# Patient Record
Sex: Female | Born: 1945
Health system: Southern US, Community
[De-identification: ages and names within clinical notes are randomized; demographics above are authoritative.]

## PROBLEM LIST (undated history)

## (undated) DIAGNOSIS — K635 Polyp of colon: Secondary | ICD-10-CM

## (undated) DIAGNOSIS — G629 Polyneuropathy, unspecified: Secondary | ICD-10-CM

## (undated) DIAGNOSIS — E119 Type 2 diabetes mellitus without complications: Secondary | ICD-10-CM

## (undated) DIAGNOSIS — T4145XA Adverse effect of unspecified anesthetic, initial encounter: Secondary | ICD-10-CM

## (undated) DIAGNOSIS — T7840XA Allergy, unspecified, initial encounter: Secondary | ICD-10-CM

## (undated) DIAGNOSIS — F419 Anxiety disorder, unspecified: Secondary | ICD-10-CM

## (undated) DIAGNOSIS — I480 Paroxysmal atrial fibrillation: Secondary | ICD-10-CM

## (undated) DIAGNOSIS — E041 Nontoxic single thyroid nodule: Secondary | ICD-10-CM

## (undated) DIAGNOSIS — G894 Chronic pain syndrome: Secondary | ICD-10-CM

## (undated) DIAGNOSIS — I4719 Other supraventricular tachycardia: Secondary | ICD-10-CM

## (undated) DIAGNOSIS — J069 Acute upper respiratory infection, unspecified: Secondary | ICD-10-CM

## (undated) DIAGNOSIS — C801 Malignant (primary) neoplasm, unspecified: Secondary | ICD-10-CM

## (undated) DIAGNOSIS — M1990 Unspecified osteoarthritis, unspecified site: Secondary | ICD-10-CM

## (undated) DIAGNOSIS — K2980 Duodenitis without bleeding: Secondary | ICD-10-CM

## (undated) DIAGNOSIS — R9431 Abnormal electrocardiogram [ECG] [EKG]: Secondary | ICD-10-CM

## (undated) DIAGNOSIS — I471 Supraventricular tachycardia, unspecified: Secondary | ICD-10-CM

## (undated) DIAGNOSIS — G8929 Other chronic pain: Secondary | ICD-10-CM

## (undated) DIAGNOSIS — I10 Essential (primary) hypertension: Secondary | ICD-10-CM

## (undated) DIAGNOSIS — S129XXA Fracture of neck, unspecified, initial encounter: Secondary | ICD-10-CM

## (undated) DIAGNOSIS — T8859XA Other complications of anesthesia, initial encounter: Secondary | ICD-10-CM

## (undated) DIAGNOSIS — K219 Gastro-esophageal reflux disease without esophagitis: Secondary | ICD-10-CM

## (undated) DIAGNOSIS — K5730 Diverticulosis of large intestine without perforation or abscess without bleeding: Secondary | ICD-10-CM

## (undated) DIAGNOSIS — C539 Malignant neoplasm of cervix uteri, unspecified: Secondary | ICD-10-CM

## (undated) DIAGNOSIS — I499 Cardiac arrhythmia, unspecified: Secondary | ICD-10-CM

## (undated) DIAGNOSIS — I509 Heart failure, unspecified: Secondary | ICD-10-CM

## (undated) DIAGNOSIS — R079 Chest pain, unspecified: Secondary | ICD-10-CM

## (undated) DIAGNOSIS — I219 Acute myocardial infarction, unspecified: Secondary | ICD-10-CM

## (undated) DIAGNOSIS — S060X9A Concussion with loss of consciousness of unspecified duration, initial encounter: Secondary | ICD-10-CM

## (undated) DIAGNOSIS — K589 Irritable bowel syndrome without diarrhea: Secondary | ICD-10-CM

## (undated) DIAGNOSIS — M797 Fibromyalgia: Secondary | ICD-10-CM

## (undated) DIAGNOSIS — S060XAA Concussion with loss of consciousness status unknown, initial encounter: Secondary | ICD-10-CM

## (undated) HISTORY — DX: Allergy, unspecified, initial encounter: T78.40XA

## (undated) HISTORY — PX: CARDIAC ELECTROPHYSIOLOGY STUDY AND ABLATION: SHX1294

## (undated) HISTORY — DX: Polyneuropathy, unspecified: G62.9

## (undated) HISTORY — DX: Nontoxic single thyroid nodule: E04.1

## (undated) HISTORY — DX: Other supraventricular tachycardia: I47.19

## (undated) HISTORY — DX: Fibromyalgia: M79.7

## (undated) HISTORY — DX: Duodenitis without bleeding: K29.80

## (undated) HISTORY — DX: Diverticulosis of large intestine without perforation or abscess without bleeding: K57.30

## (undated) HISTORY — DX: Acute myocardial infarction, unspecified: I21.9

## (undated) HISTORY — PX: UPPER GASTROINTESTINAL ENDOSCOPY: SHX188

## (undated) HISTORY — DX: Gastro-esophageal reflux disease without esophagitis: K21.9

## (undated) HISTORY — DX: Essential (primary) hypertension: I10

## (undated) HISTORY — DX: Unspecified osteoarthritis, unspecified site: M19.90

## (undated) HISTORY — DX: Polyp of colon: K63.5

## (undated) HISTORY — DX: Chronic pain syndrome: G89.4

## (undated) HISTORY — PX: APPENDECTOMY: SHX54

## (undated) HISTORY — PX: BIOPSY THYROID: PRO38

## (undated) HISTORY — PX: ESOPHAGOGASTRODUODENOSCOPY: SHX1529

## (undated) HISTORY — DX: Concussion with loss of consciousness status unknown, initial encounter: S06.0XAA

## (undated) HISTORY — DX: Irritable bowel syndrome, unspecified: K58.9

## (undated) HISTORY — DX: Anxiety disorder, unspecified: F41.9

## (undated) HISTORY — DX: Fracture of neck, unspecified, initial encounter: S12.9XXA

## (undated) HISTORY — DX: Malignant neoplasm of cervix uteri, unspecified: C53.9

## (undated) HISTORY — DX: Supraventricular tachycardia: I47.1

## (undated) HISTORY — PX: COLPOSCOPY: SHX161

## (undated) HISTORY — DX: Concussion with loss of consciousness of unspecified duration, initial encounter: S06.0X9A

## (undated) NOTE — *Deleted (*Deleted)
***In Progress*** PCP:  Corwin Levins, MD      Cardiologist:  Dr. Shirlee Latch  HPI:  Renee [name] is a 27 y.o. female who has a h/o AVNRT ablation, NSVT, long QT syndrome, and chronic chest pain syndrome. She has a long history of chest pain. Coronary CT angiogram in 10/14 showed no coronary plaque and calcium score = 0.   She has been on diltiazem CD which helped her chest pain in the past.  She was admitted overnight in 4/18 with shoulder pain and diaphoresis. She took NTG and got lightheaded. Troponin was negative x 3, ECG was unremarkable. She was noted on device interrogation to have had an episode of SVT in the 160s. This, however, was asymptomatic and not related to the admission. Toprol XL 12.5 mg daily was added to her regimen but she subsequently stopped it.   With SVT runs on device interrogation in 03/2017, I increased her flecainide was increased to 100 mg bid. It was subsequently decreased back to 50 mg bid.   With increased chest pain, she had coronary CTA again in 04/2018.  This showed coronary artery calcium score 0, no significant coronary disease.   Echo in 10/2018 showed EF 55-60%, grade 2 diastolic dysfunction, normal RV size and systolic function.  Echo in 09/2019 with EF 55-60%, moderate LVH, normal RV.   She has a history of "spells" where she is profoundly weak, breathless, lightheaded, flushed, "shaky," "spaced out," and nauseated. She has had extensive workup of these spells. She saw Dr Ladona Ridgel for placement of loop recorder. He also had her start flecainide due to concern for SVT. She had a tilt test in 12/14 that was negative. Finally, in 02/12/20, her monitor captured a 7 second pause during the day associated with presyncope. She had a Biotronik dual chamber PPM placed.   She was recently seen for follow-up with Dr. Shirlee Latch on 04/07/20. She reported still having "spells" where she felt profoundly weak, lightheaded, tremulous, and short of breath.  Interrogation of device did not show corresponding arrhythmias.  BP had been running high, SBP 130s-150s when she checked at home. BP 180/90 was in clinic. Did not have chest pain or orthopnea/PND.  Was generally able to walk on flat ground without problem except when she had a "spell."  When these occurred, she had to sit down. She continued to note neuropathic-type pain and numbness in her feet and hands.  Today she returns to HF clinic for pharmacist medication titration. At last visit with MD losartan 25 mg daily was started.     Last time: 180/90, HR 71 (labs ok) Plan A: Increase losartan to 50 (seems to be ~130-140s usually) Plan B: Switch to Entresto 24/26 Plan C: Add spironolactone 12.5 or 25 daily pending BP Plan D: SGLT2 Labs: at next visit (already checked since starting losartan) F/u: 4-5 weeks pharmacy, DM on 08/11/20   Overall feeling ***. Dizziness, lightheadedness, fatigue:  Chest pain or palpitations:  How is your breathing?: *** SOB: Able to complete all ADLs. Activity level ***  Weight at home pounds. Takes furosemide/torsemide/bumex *** mg *** daily.  LEE PND/Orthopnea  Appetite *** Low-salt diet:   Physical Exam Cost/affordability of meds   HF Medications: Losartan 25 mg daily Furosemide 20 mg daily Potassium Chloride 30 mEq daily  Has the patient been experiencing any side effects to the medications prescribed?  {YES NO:22349}  Does the patient have any problems obtaining medications due to transportation or finances?   {YES NU:27253}  Understanding of regimen: {excellent/good/fair/poor:19665} Understanding of indications: {excellent/good/fair/poor:19665} Potential of compliance: {excellent/good/fair/poor:19665} Patient understands to avoid NSAIDs. Patient understands to avoid decongestants.    Pertinent Lab Values 04/22/20: . Serum creatinine 0.79, BUN 7, Potassium 3.8, Sodium 138   Vital Signs: . Weight: *** (last clinic weight: ***) . Blood  pressure: ***  . Heart rate: ***   Assessment: 1. SVT: Prior history of AVNRT ablation. On flecainide 50 mg bid, she is much less symptomatic in terms of palpitations.  - Continue diltiazem CD 240 mg daily.  - Continue flecainide 50 mg BID  2. Long QT syndrome: The QT interval has been normal to marginally elevated on ECGs since I have seen her. Avoid QT-prolonging medications.   3. Chest pain syndrome: Long history of atypical chest pain. Workups have not been suggestive of macrovascular coronary disease. Cannot rule out microvascular angina or coronary vasospasm. Coronary CTA in 11/19 was normal.  No recent chest pain.  - Continue diltiazem CD for possible microvascular angina.   4. HTN: BP elevated recently.  - Continue*** losartan ***25 mg daily   5. Syncope/complete heart block: 7 second waking pause seen on monitor in past. Likely vagal etiology but nonetheless concerning presentation. Biotronik dual chamber PPM was placed.  She subsequently developed pacemaker syndrome and was markedly symptomatic with RV pacing.  She is now set at [facility] with no problems  6. Atrial fibrillation: Paroxysmal. Very short runs of atrial fibrillation have been noted on device interrogation. Given lack of longer runs of atrial fibrillation, there is no clear anticoagulation indication. Monitor closely.   7. Chronic diastolic CHF: Echo in 4/21 with EF 55-60%, moderate LVH.  Now on a low dose of Lasix.  She is not volume overloaded on exam. She still describes NYHA class III symptoms that seem to wax and wane.  - Continue Lasix 20 mg daily. BMET today.  - Given diastolic CHF with LVH on echo and neuropathy symptoms, would consider cardiac amyloidosis.  Myeloma panel and urine immunofixation were negative.  Currently in the process of arranging for a PYP scan.   Plan: 1) Medication changes: Based on clinical presentation, vital signs and recent labs will *** 2) Labs: *** 3) Follow-up: ***   Tama Headings, PharmD PGY2 Cardiology Pharmacy Resident  Karle Plumber, PharmD, BCPS, [facility], CPP Heart Failure Clinic Pharmacist [phone]

---

## 1978-06-13 HISTORY — PX: CHOLECYSTECTOMY: SHX55

## 1988-09-05 HISTORY — PX: ORIF RADIAL HEAD / NECK FRACTURE: SUR956

## 1991-06-14 HISTORY — PX: ABLATION OF DYSRHYTHMIC FOCUS: SHX254

## 1991-09-12 DIAGNOSIS — I219 Acute myocardial infarction, unspecified: Secondary | ICD-10-CM

## 1991-09-12 HISTORY — DX: Acute myocardial infarction, unspecified: I21.9

## 1999-02-17 ENCOUNTER — Encounter: Payer: Self-pay | Admitting: Emergency Medicine

## 1999-02-17 ENCOUNTER — Observation Stay (HOSPITAL_COMMUNITY): Admission: EM | Admit: 1999-02-17 | Discharge: 1999-02-18 | Payer: Self-pay | Admitting: Emergency Medicine

## 1999-02-18 ENCOUNTER — Encounter: Payer: Self-pay | Admitting: Cardiology

## 1999-09-14 ENCOUNTER — Emergency Department (HOSPITAL_COMMUNITY): Admission: EM | Admit: 1999-09-14 | Discharge: 1999-09-14 | Payer: Self-pay | Admitting: Emergency Medicine

## 1999-09-14 ENCOUNTER — Encounter: Payer: Self-pay | Admitting: Emergency Medicine

## 2000-03-05 ENCOUNTER — Encounter: Payer: Self-pay | Admitting: Pulmonary Disease

## 2000-03-05 ENCOUNTER — Ambulatory Visit (HOSPITAL_COMMUNITY): Admission: RE | Admit: 2000-03-05 | Discharge: 2000-03-05 | Payer: Self-pay | Admitting: Pulmonary Disease

## 2000-04-05 ENCOUNTER — Encounter: Payer: Self-pay | Admitting: Cardiology

## 2000-04-05 ENCOUNTER — Ambulatory Visit (HOSPITAL_COMMUNITY): Admission: RE | Admit: 2000-04-05 | Discharge: 2000-04-05 | Payer: Self-pay | Admitting: Cardiology

## 2000-04-18 ENCOUNTER — Ambulatory Visit (HOSPITAL_COMMUNITY): Admission: RE | Admit: 2000-04-18 | Discharge: 2000-04-18 | Payer: Self-pay | Admitting: Pulmonary Disease

## 2000-04-19 ENCOUNTER — Encounter: Payer: Self-pay | Admitting: Pulmonary Disease

## 2000-05-01 ENCOUNTER — Encounter: Payer: Self-pay | Admitting: Pulmonary Disease

## 2000-05-01 ENCOUNTER — Encounter (INDEPENDENT_AMBULATORY_CARE_PROVIDER_SITE_OTHER): Payer: Self-pay | Admitting: *Deleted

## 2000-05-01 ENCOUNTER — Ambulatory Visit (HOSPITAL_COMMUNITY): Admission: RE | Admit: 2000-05-01 | Discharge: 2000-05-01 | Payer: Self-pay | Admitting: Pulmonary Disease

## 2000-06-13 HISTORY — PX: HYSTEROSCOPY: SHX211

## 2000-07-31 ENCOUNTER — Ambulatory Visit (HOSPITAL_COMMUNITY): Admission: RE | Admit: 2000-07-31 | Discharge: 2000-07-31 | Payer: Self-pay | Admitting: Pulmonary Disease

## 2000-07-31 ENCOUNTER — Encounter: Payer: Self-pay | Admitting: Pulmonary Disease

## 2000-10-18 ENCOUNTER — Encounter (INDEPENDENT_AMBULATORY_CARE_PROVIDER_SITE_OTHER): Payer: Self-pay

## 2000-10-18 ENCOUNTER — Other Ambulatory Visit: Admission: RE | Admit: 2000-10-18 | Discharge: 2000-10-18 | Payer: Self-pay | Admitting: Obstetrics and Gynecology

## 2000-11-09 ENCOUNTER — Encounter: Payer: Self-pay | Admitting: Surgery

## 2000-11-09 ENCOUNTER — Ambulatory Visit (HOSPITAL_COMMUNITY): Admission: RE | Admit: 2000-11-09 | Discharge: 2000-11-09 | Payer: Self-pay | Admitting: Surgery

## 2000-12-09 ENCOUNTER — Encounter: Payer: Self-pay | Admitting: Emergency Medicine

## 2000-12-09 ENCOUNTER — Emergency Department (HOSPITAL_COMMUNITY): Admission: EM | Admit: 2000-12-09 | Discharge: 2000-12-09 | Payer: Self-pay | Admitting: Emergency Medicine

## 2000-12-12 ENCOUNTER — Emergency Department (HOSPITAL_COMMUNITY): Admission: EM | Admit: 2000-12-12 | Discharge: 2000-12-12 | Payer: Self-pay | Admitting: Emergency Medicine

## 2001-01-25 ENCOUNTER — Encounter (INDEPENDENT_AMBULATORY_CARE_PROVIDER_SITE_OTHER): Payer: Self-pay | Admitting: Specialist

## 2001-01-25 ENCOUNTER — Ambulatory Visit (HOSPITAL_COMMUNITY): Admission: RE | Admit: 2001-01-25 | Discharge: 2001-01-25 | Payer: Self-pay | Admitting: Obstetrics and Gynecology

## 2001-04-17 ENCOUNTER — Encounter: Admission: RE | Admit: 2001-04-17 | Discharge: 2001-07-16 | Payer: Self-pay | Admitting: Orthopaedic Surgery

## 2001-05-11 ENCOUNTER — Encounter: Payer: Self-pay | Admitting: Surgery

## 2001-05-11 ENCOUNTER — Encounter: Admission: RE | Admit: 2001-05-11 | Discharge: 2001-05-11 | Payer: Self-pay | Admitting: Obstetrics and Gynecology

## 2001-06-10 ENCOUNTER — Encounter: Payer: Self-pay | Admitting: Orthopedic Surgery

## 2001-06-10 ENCOUNTER — Ambulatory Visit (HOSPITAL_COMMUNITY): Admission: RE | Admit: 2001-06-10 | Discharge: 2001-06-10 | Payer: Self-pay | Admitting: Orthopedic Surgery

## 2001-07-24 ENCOUNTER — Inpatient Hospital Stay (HOSPITAL_COMMUNITY): Admission: AD | Admit: 2001-07-24 | Discharge: 2001-07-27 | Payer: Self-pay | Admitting: Cardiology

## 2002-01-24 ENCOUNTER — Encounter: Payer: Self-pay | Admitting: Cardiology

## 2002-01-24 ENCOUNTER — Inpatient Hospital Stay (HOSPITAL_COMMUNITY): Admission: EM | Admit: 2002-01-24 | Discharge: 2002-01-25 | Payer: Self-pay

## 2002-01-25 ENCOUNTER — Encounter: Payer: Self-pay | Admitting: Cardiology

## 2002-06-27 ENCOUNTER — Encounter: Payer: Self-pay | Admitting: Surgery

## 2002-06-27 ENCOUNTER — Encounter: Admission: RE | Admit: 2002-06-27 | Discharge: 2002-06-27 | Payer: Self-pay | Admitting: Surgery

## 2002-09-16 ENCOUNTER — Inpatient Hospital Stay (HOSPITAL_COMMUNITY): Admission: EM | Admit: 2002-09-16 | Discharge: 2002-09-17 | Payer: Self-pay | Admitting: *Deleted

## 2002-09-16 ENCOUNTER — Encounter: Payer: Self-pay | Admitting: *Deleted

## 2002-11-25 ENCOUNTER — Encounter (HOSPITAL_COMMUNITY): Admission: RE | Admit: 2002-11-25 | Discharge: 2003-02-23 | Payer: Self-pay | Admitting: Cardiology

## 2003-02-24 ENCOUNTER — Encounter (HOSPITAL_COMMUNITY): Admission: RE | Admit: 2003-02-24 | Discharge: 2003-05-25 | Payer: Self-pay | Admitting: Cardiology

## 2003-06-05 ENCOUNTER — Encounter: Admission: RE | Admit: 2003-06-05 | Discharge: 2003-06-05 | Payer: Self-pay | Admitting: Obstetrics and Gynecology

## 2003-06-24 ENCOUNTER — Encounter: Admission: RE | Admit: 2003-06-24 | Discharge: 2003-06-24 | Payer: Self-pay | Admitting: Surgery

## 2003-07-16 ENCOUNTER — Encounter: Admission: RE | Admit: 2003-07-16 | Discharge: 2003-07-16 | Payer: Self-pay | Admitting: Cardiology

## 2003-07-23 ENCOUNTER — Ambulatory Visit (HOSPITAL_COMMUNITY): Admission: RE | Admit: 2003-07-23 | Discharge: 2003-07-23 | Payer: Self-pay | Admitting: Cardiology

## 2003-11-19 ENCOUNTER — Emergency Department (HOSPITAL_COMMUNITY): Admission: EM | Admit: 2003-11-19 | Discharge: 2003-11-19 | Payer: Self-pay | Admitting: Emergency Medicine

## 2004-03-23 ENCOUNTER — Emergency Department (HOSPITAL_COMMUNITY): Admission: EM | Admit: 2004-03-23 | Discharge: 2004-03-23 | Payer: Self-pay | Admitting: Emergency Medicine

## 2004-04-29 ENCOUNTER — Ambulatory Visit: Payer: Self-pay | Admitting: Internal Medicine

## 2004-05-07 ENCOUNTER — Ambulatory Visit: Payer: Self-pay | Admitting: Cardiology

## 2005-04-20 ENCOUNTER — Encounter: Admission: RE | Admit: 2005-04-20 | Discharge: 2005-04-20 | Payer: Self-pay | Admitting: Obstetrics and Gynecology

## 2005-05-03 ENCOUNTER — Encounter: Admission: RE | Admit: 2005-05-03 | Discharge: 2005-05-03 | Payer: Self-pay | Admitting: Obstetrics and Gynecology

## 2005-06-13 HISTORY — PX: CORONARY ANGIOPLASTY: SHX604

## 2005-07-28 ENCOUNTER — Ambulatory Visit: Payer: Self-pay | Admitting: Gastroenterology

## 2005-08-10 ENCOUNTER — Encounter (INDEPENDENT_AMBULATORY_CARE_PROVIDER_SITE_OTHER): Payer: Self-pay | Admitting: Specialist

## 2005-08-10 ENCOUNTER — Ambulatory Visit: Payer: Self-pay | Admitting: Gastroenterology

## 2005-09-13 ENCOUNTER — Ambulatory Visit: Payer: Self-pay | Admitting: Gastroenterology

## 2005-09-19 ENCOUNTER — Ambulatory Visit: Payer: Self-pay | Admitting: Gastroenterology

## 2005-09-23 ENCOUNTER — Ambulatory Visit: Payer: Self-pay | Admitting: Cardiology

## 2005-09-23 ENCOUNTER — Inpatient Hospital Stay (HOSPITAL_COMMUNITY): Admission: EM | Admit: 2005-09-23 | Discharge: 2005-09-24 | Payer: Self-pay | Admitting: Emergency Medicine

## 2005-09-26 ENCOUNTER — Ambulatory Visit: Payer: Self-pay

## 2005-09-29 ENCOUNTER — Ambulatory Visit: Payer: Self-pay | Admitting: Pulmonary Disease

## 2005-09-29 ENCOUNTER — Ambulatory Visit: Payer: Self-pay

## 2005-10-14 ENCOUNTER — Ambulatory Visit: Payer: Self-pay | Admitting: Pulmonary Disease

## 2005-10-18 ENCOUNTER — Ambulatory Visit: Payer: Self-pay | Admitting: Cardiology

## 2005-12-05 ENCOUNTER — Ambulatory Visit: Payer: Self-pay | Admitting: Pulmonary Disease

## 2005-12-19 ENCOUNTER — Emergency Department (HOSPITAL_COMMUNITY): Admission: EM | Admit: 2005-12-19 | Discharge: 2005-12-20 | Payer: Self-pay | Admitting: Emergency Medicine

## 2006-03-02 ENCOUNTER — Ambulatory Visit: Payer: Self-pay | Admitting: Cardiology

## 2006-03-10 ENCOUNTER — Ambulatory Visit: Payer: Self-pay | Admitting: Cardiology

## 2006-04-05 ENCOUNTER — Ambulatory Visit: Payer: Self-pay | Admitting: Pulmonary Disease

## 2006-06-22 ENCOUNTER — Encounter: Admission: RE | Admit: 2006-06-22 | Discharge: 2006-06-22 | Payer: Self-pay | Admitting: Pulmonary Disease

## 2006-08-09 ENCOUNTER — Encounter: Payer: Self-pay | Admitting: Gastroenterology

## 2006-08-25 ENCOUNTER — Ambulatory Visit: Payer: Self-pay | Admitting: Pulmonary Disease

## 2006-08-29 ENCOUNTER — Ambulatory Visit: Payer: Self-pay | Admitting: Cardiology

## 2007-01-15 ENCOUNTER — Ambulatory Visit: Payer: Self-pay | Admitting: Pulmonary Disease

## 2007-01-15 LAB — CONVERTED CEMR LAB
ALT: 18 units/L (ref 0–35)
AST: 22 units/L (ref 0–37)
Albumin: 4.6 g/dL (ref 3.5–5.2)
Alkaline Phosphatase: 87 units/L (ref 39–117)
BUN: 11 mg/dL (ref 6–23)
Basophils Absolute: 0 10*3/uL (ref 0.0–0.1)
Basophils Relative: 0.5 % (ref 0.0–1.0)
Bilirubin, Direct: 0.2 mg/dL (ref 0.0–0.3)
CO2: 32 meq/L (ref 19–32)
Calcium: 10.1 mg/dL (ref 8.4–10.5)
Chloride: 107 meq/L (ref 96–112)
Creatinine, Ser: 0.7 mg/dL (ref 0.4–1.2)
Eosinophils Absolute: 0.2 10*3/uL (ref 0.0–0.6)
Eosinophils Relative: 2.4 % (ref 0.0–5.0)
Free T4: 0.9 ng/dL (ref 0.6–1.6)
GFR calc Af Amer: 109 mL/min
GFR calc non Af Amer: 90 mL/min
Glucose, Bld: 102 mg/dL — ABNORMAL HIGH (ref 70–99)
HCT: 38.1 % (ref 36.0–46.0)
Hemoglobin: 13 g/dL (ref 12.0–15.0)
Lymphocytes Relative: 48.9 % — ABNORMAL HIGH (ref 12.0–46.0)
MCHC: 34.1 g/dL (ref 30.0–36.0)
MCV: 84 fL (ref 78.0–100.0)
Monocytes Absolute: 0.4 10*3/uL (ref 0.2–0.7)
Monocytes Relative: 6.1 % (ref 3.0–11.0)
Neutro Abs: 2.7 10*3/uL (ref 1.4–7.7)
Neutrophils Relative %: 42.1 % — ABNORMAL LOW (ref 43.0–77.0)
Platelets: 256 10*3/uL (ref 150–400)
Potassium: 4.1 meq/L (ref 3.5–5.1)
Pro B Natriuretic peptide (BNP): 17 pg/mL (ref 0.0–100.0)
RBC: 4.54 M/uL (ref 3.87–5.11)
RDW: 11.8 % (ref 11.5–14.6)
Sed Rate: 8 mm/hr (ref 0–25)
Sodium: 143 meq/L (ref 135–145)
T3, Free: 3.1 pg/mL (ref 2.3–4.2)
TSH: 1.35 microintl units/mL (ref 0.35–5.50)
Total Bilirubin: 1.2 mg/dL (ref 0.3–1.2)
Total Protein: 6.8 g/dL (ref 6.0–8.3)
WBC: 6.3 10*3/uL (ref 4.5–10.5)

## 2007-02-14 ENCOUNTER — Ambulatory Visit: Payer: Self-pay | Admitting: Cardiology

## 2007-03-01 ENCOUNTER — Ambulatory Visit: Payer: Self-pay | Admitting: Pulmonary Disease

## 2007-03-06 ENCOUNTER — Encounter: Admission: RE | Admit: 2007-03-06 | Discharge: 2007-03-06 | Payer: Self-pay | Admitting: Obstetrics and Gynecology

## 2007-07-04 ENCOUNTER — Telehealth: Payer: Self-pay | Admitting: Pulmonary Disease

## 2007-07-20 ENCOUNTER — Telehealth: Payer: Self-pay | Admitting: Pulmonary Disease

## 2007-07-31 ENCOUNTER — Telehealth (INDEPENDENT_AMBULATORY_CARE_PROVIDER_SITE_OTHER): Payer: Self-pay | Admitting: *Deleted

## 2007-08-07 ENCOUNTER — Encounter: Payer: Self-pay | Admitting: Pulmonary Disease

## 2007-08-07 ENCOUNTER — Encounter: Admission: RE | Admit: 2007-08-07 | Discharge: 2007-08-07 | Payer: Self-pay | Admitting: Pulmonary Disease

## 2007-08-14 ENCOUNTER — Ambulatory Visit: Payer: Self-pay | Admitting: Cardiology

## 2007-08-22 ENCOUNTER — Telehealth: Payer: Self-pay | Admitting: Pulmonary Disease

## 2008-02-27 ENCOUNTER — Ambulatory Visit: Payer: Self-pay | Admitting: Cardiology

## 2008-03-14 ENCOUNTER — Ambulatory Visit: Payer: Self-pay | Admitting: Cardiology

## 2008-03-14 LAB — CONVERTED CEMR LAB
Free T4: 0.8 ng/dL (ref 0.6–1.6)
TSH: 2.26 microintl units/mL (ref 0.35–5.50)

## 2008-03-24 DIAGNOSIS — M1990 Unspecified osteoarthritis, unspecified site: Secondary | ICD-10-CM | POA: Insufficient documentation

## 2008-03-24 DIAGNOSIS — M545 Low back pain: Secondary | ICD-10-CM

## 2008-03-24 DIAGNOSIS — K219 Gastro-esophageal reflux disease without esophagitis: Secondary | ICD-10-CM

## 2008-03-24 DIAGNOSIS — M797 Fibromyalgia: Secondary | ICD-10-CM

## 2008-03-24 DIAGNOSIS — I10 Essential (primary) hypertension: Secondary | ICD-10-CM

## 2008-03-24 DIAGNOSIS — F411 Generalized anxiety disorder: Secondary | ICD-10-CM | POA: Insufficient documentation

## 2008-03-24 DIAGNOSIS — Z8669 Personal history of other diseases of the nervous system and sense organs: Secondary | ICD-10-CM | POA: Insufficient documentation

## 2008-03-24 DIAGNOSIS — R079 Chest pain, unspecified: Secondary | ICD-10-CM

## 2008-03-26 ENCOUNTER — Telehealth (INDEPENDENT_AMBULATORY_CARE_PROVIDER_SITE_OTHER): Payer: Self-pay | Admitting: *Deleted

## 2008-04-02 ENCOUNTER — Telehealth (INDEPENDENT_AMBULATORY_CARE_PROVIDER_SITE_OTHER): Payer: Self-pay | Admitting: *Deleted

## 2008-05-26 ENCOUNTER — Ambulatory Visit: Payer: Self-pay | Admitting: Pulmonary Disease

## 2008-05-26 DIAGNOSIS — I2510 Atherosclerotic heart disease of native coronary artery without angina pectoris: Secondary | ICD-10-CM | POA: Insufficient documentation

## 2008-05-26 DIAGNOSIS — R159 Full incontinence of feces: Secondary | ICD-10-CM | POA: Insufficient documentation

## 2008-05-26 DIAGNOSIS — G894 Chronic pain syndrome: Secondary | ICD-10-CM | POA: Insufficient documentation

## 2008-05-26 DIAGNOSIS — E041 Nontoxic single thyroid nodule: Secondary | ICD-10-CM | POA: Insufficient documentation

## 2008-05-26 DIAGNOSIS — K589 Irritable bowel syndrome without diarrhea: Secondary | ICD-10-CM | POA: Insufficient documentation

## 2008-05-26 DIAGNOSIS — J42 Unspecified chronic bronchitis: Secondary | ICD-10-CM | POA: Insufficient documentation

## 2008-05-29 ENCOUNTER — Ambulatory Visit: Payer: Self-pay | Admitting: Pulmonary Disease

## 2008-06-01 LAB — CONVERTED CEMR LAB
ALT: 15 units/L (ref 0–35)
AST: 15 units/L (ref 0–37)
Albumin: 4.5 g/dL (ref 3.5–5.2)
Alkaline Phosphatase: 84 units/L (ref 39–117)
BUN: 8 mg/dL (ref 6–23)
Bacteria, UA: NEGATIVE
Basophils Relative: 0.5 % (ref 0.0–3.0)
Bilirubin Urine: NEGATIVE
Bilirubin, Direct: 0.3 mg/dL (ref 0.0–0.3)
CO2: 30 meq/L (ref 19–32)
Calcium: 10.1 mg/dL (ref 8.4–10.5)
Chloride: 106 meq/L (ref 96–112)
Cholesterol: 159 mg/dL (ref 0–200)
Creatinine, Ser: 0.6 mg/dL (ref 0.4–1.2)
Crystals: NEGATIVE
Eosinophils Relative: 1.8 % (ref 0.0–5.0)
GFR calc Af Amer: 130 mL/min
GFR calc non Af Amer: 108 mL/min
Glucose, Bld: 100 mg/dL — ABNORMAL HIGH (ref 70–99)
HCT: 42 % (ref 36.0–46.0)
HDL: 72.2 mg/dL (ref >39.0)
Hemoglobin: 13.8 g/dL (ref 12.0–15.0)
Ketones, ur: NEGATIVE mg/dL
LDL Cholesterol: 74 mg/dL (ref 0–99)
Lymphocytes Relative: 43 % (ref 12.0–46.0)
MCHC: 32.8 g/dL (ref 30.0–36.0)
MCV: 86.6 fL (ref 78.0–100.0)
Monocytes Relative: 4.4 % (ref 3.0–12.0)
Mucus, UA: NEGATIVE
Neutrophils Relative %: 50.3 % (ref 43.0–77.0)
Nitrite: NEGATIVE
Platelets: 186 10*3/uL (ref 150–400)
Potassium: 4.2 meq/L (ref 3.5–5.1)
RBC / HPF: NONE SEEN
RBC: 4.85 M/uL (ref 3.87–5.11)
RDW: 11.6 % (ref 11.5–14.6)
Sed Rate: 6 mm/hr (ref 0–22)
Sodium: 140 meq/L (ref 135–145)
Specific Gravity, Urine: <=1.005 (ref 1.000–1.03)
Total Bilirubin: 1.6 mg/dL — ABNORMAL HIGH (ref 0.3–1.2)
Total CHOL/HDL Ratio: 2.2
Total Protein, Urine: NEGATIVE mg/dL
Total Protein: 6.8 g/dL (ref 6.0–8.3)
Triglycerides: 62 mg/dL (ref 0–149)
Urine Glucose: NEGATIVE mg/dL
Urobilinogen, UA: 0.2 (ref 0.0–1.0)
VLDL: 12 mg/dL (ref 0–40)
WBC: 4.4 10*3/uL — ABNORMAL LOW (ref 4.5–10.5)
pH: 6 (ref 5.0–8.0)

## 2008-06-16 DIAGNOSIS — K5909 Other constipation: Secondary | ICD-10-CM

## 2008-06-16 DIAGNOSIS — K649 Unspecified hemorrhoids: Secondary | ICD-10-CM | POA: Insufficient documentation

## 2008-06-16 DIAGNOSIS — K5900 Constipation, unspecified: Secondary | ICD-10-CM | POA: Insufficient documentation

## 2008-06-16 DIAGNOSIS — K2980 Duodenitis without bleeding: Secondary | ICD-10-CM | POA: Insufficient documentation

## 2008-06-17 ENCOUNTER — Ambulatory Visit: Payer: Self-pay | Admitting: Gastroenterology

## 2008-06-17 DIAGNOSIS — R1319 Other dysphagia: Secondary | ICD-10-CM

## 2008-06-17 LAB — CONVERTED CEMR LAB
ALT: 13 units/L (ref 0–35)
AST: 14 units/L (ref 0–37)
Albumin: 4.8 g/dL (ref 3.5–5.2)
Alkaline Phosphatase: 85 units/L (ref 39–117)
BUN: 7 mg/dL (ref 6–23)
Basophils Absolute: 0 10*3/uL (ref 0.0–0.1)
Basophils Relative: 0.4 % (ref 0.0–3.0)
Bilirubin, Direct: 0.3 mg/dL (ref 0.0–0.3)
CO2: 29 meq/L (ref 19–32)
Calcium: 10.5 mg/dL (ref 8.4–10.5)
Chloride: 103 meq/L (ref 96–112)
Creatinine, Ser: 0.7 mg/dL (ref 0.4–1.2)
Eosinophils Absolute: 0.1 10*3/uL (ref 0.0–0.7)
Eosinophils Relative: 1 % (ref 0.0–5.0)
Ferritin: 48.7 ng/mL (ref 10.0–291.0)
Folate: 12.1 ng/mL
GFR calc Af Amer: 109 mL/min
GFR calc non Af Amer: 90 mL/min
Glucose, Bld: 100 mg/dL — ABNORMAL HIGH (ref 70–99)
HCT: 40.6 % (ref 36.0–46.0)
Hemoglobin: 14 g/dL (ref 12.0–15.0)
Iron: 111 ug/dL (ref 42–145)
Lymphocytes Relative: 40.8 % (ref 12.0–46.0)
MCHC: 34.4 g/dL (ref 30.0–36.0)
MCV: 84.6 fL (ref 78.0–100.0)
Monocytes Absolute: 0.2 10*3/uL (ref 0.1–1.0)
Monocytes Relative: 4.3 % (ref 3.0–12.0)
Neutro Abs: 2.9 10*3/uL (ref 1.4–7.7)
Neutrophils Relative %: 53.5 % (ref 43.0–77.0)
Platelets: 219 10*3/uL (ref 150–400)
Potassium: 3.7 meq/L (ref 3.5–5.1)
RBC: 4.8 M/uL (ref 3.87–5.11)
RDW: 11.9 % (ref 11.5–14.6)
Saturation Ratios: 27.3 % (ref 20.0–50.0)
Sodium: 141 meq/L (ref 135–145)
TSH: 1.17 microintl units/mL (ref 0.35–5.50)
Total Bilirubin: 1.8 mg/dL — ABNORMAL HIGH (ref 0.3–1.2)
Total Protein: 7.3 g/dL (ref 6.0–8.3)
Transferrin: 290.1 mg/dL (ref >=212.0)
Vitamin B-12: 243 pg/mL (ref 211–911)
WBC: 5.4 10*3/uL (ref 4.5–10.5)

## 2008-06-18 ENCOUNTER — Encounter: Payer: Self-pay | Admitting: Gastroenterology

## 2008-06-20 ENCOUNTER — Telehealth: Payer: Self-pay | Admitting: Internal Medicine

## 2008-06-25 ENCOUNTER — Encounter: Payer: Self-pay | Admitting: Gastroenterology

## 2008-06-25 ENCOUNTER — Ambulatory Visit: Payer: Self-pay | Admitting: Gastroenterology

## 2008-06-25 HISTORY — PX: COLONOSCOPY: SHX174

## 2008-06-26 ENCOUNTER — Encounter: Payer: Self-pay | Admitting: Gastroenterology

## 2008-07-17 ENCOUNTER — Telehealth (INDEPENDENT_AMBULATORY_CARE_PROVIDER_SITE_OTHER): Payer: Self-pay | Admitting: *Deleted

## 2008-08-07 ENCOUNTER — Encounter: Payer: Self-pay | Admitting: Cardiology

## 2008-08-07 ENCOUNTER — Encounter: Admission: RE | Admit: 2008-08-07 | Discharge: 2008-08-07 | Payer: Self-pay | Admitting: Obstetrics and Gynecology

## 2008-08-14 ENCOUNTER — Encounter: Admission: RE | Admit: 2008-08-14 | Discharge: 2008-08-14 | Payer: Self-pay | Admitting: Obstetrics and Gynecology

## 2008-09-08 ENCOUNTER — Telehealth (INDEPENDENT_AMBULATORY_CARE_PROVIDER_SITE_OTHER): Payer: Self-pay | Admitting: *Deleted

## 2008-09-11 ENCOUNTER — Ambulatory Visit: Payer: Self-pay | Admitting: Pulmonary Disease

## 2008-10-03 DIAGNOSIS — I498 Other specified cardiac arrhythmias: Secondary | ICD-10-CM

## 2008-10-06 ENCOUNTER — Ambulatory Visit: Payer: Self-pay | Admitting: Cardiology

## 2008-10-07 LAB — CONVERTED CEMR LAB
BUN: 12 mg/dL (ref 6–23)
CO2: 29 meq/L (ref 19–32)
Chloride: 104 meq/L (ref 96–112)
Creatinine, Ser: 0.9 mg/dL (ref 0.4–1.2)
Magnesium: 2.1 mg/dL (ref 1.5–2.5)
Potassium: 4.3 meq/L (ref 3.5–5.1)

## 2009-01-28 ENCOUNTER — Telehealth: Payer: Self-pay | Admitting: Cardiology

## 2009-03-11 ENCOUNTER — Ambulatory Visit: Payer: Self-pay | Admitting: Pulmonary Disease

## 2009-03-13 HISTORY — PX: TOTAL HIP ARTHROPLASTY: SHX124

## 2009-03-19 ENCOUNTER — Telehealth: Payer: Self-pay | Admitting: Cardiology

## 2009-03-19 ENCOUNTER — Ambulatory Visit: Payer: Self-pay | Admitting: Cardiology

## 2009-03-19 DIAGNOSIS — Q898 Other specified congenital malformations: Secondary | ICD-10-CM

## 2009-03-23 ENCOUNTER — Telehealth: Payer: Self-pay | Admitting: Cardiology

## 2009-03-25 ENCOUNTER — Telehealth: Payer: Self-pay | Admitting: Cardiology

## 2009-05-27 ENCOUNTER — Ambulatory Visit: Payer: Self-pay | Admitting: Cardiology

## 2009-06-24 ENCOUNTER — Telehealth: Payer: Self-pay | Admitting: Pulmonary Disease

## 2009-08-10 ENCOUNTER — Telehealth (INDEPENDENT_AMBULATORY_CARE_PROVIDER_SITE_OTHER): Payer: Self-pay | Admitting: *Deleted

## 2009-08-11 ENCOUNTER — Encounter: Admission: RE | Admit: 2009-08-11 | Discharge: 2009-08-11 | Payer: Self-pay | Admitting: Obstetrics and Gynecology

## 2009-09-16 ENCOUNTER — Telehealth (INDEPENDENT_AMBULATORY_CARE_PROVIDER_SITE_OTHER): Payer: Self-pay | Admitting: *Deleted

## 2009-09-16 ENCOUNTER — Ambulatory Visit: Payer: Self-pay | Admitting: Internal Medicine

## 2009-09-16 DIAGNOSIS — R5383 Other fatigue: Secondary | ICD-10-CM

## 2009-09-16 DIAGNOSIS — R5381 Other malaise: Secondary | ICD-10-CM

## 2009-09-16 LAB — CONVERTED CEMR LAB
Albumin: 4.7 g/dL (ref 3.5–5.2)
Basophils Absolute: 0 10*3/uL (ref 0.0–0.1)
Basophils Relative: 0.5 % (ref 0.0–3.0)
CO2: 32 meq/L (ref 19–32)
Calcium: 9.8 mg/dL (ref 8.4–10.5)
Chloride: 100 meq/L (ref 96–112)
Creatinine, Ser: 0.7 mg/dL (ref 0.4–1.2)
Eosinophils Absolute: 0.1 10*3/uL (ref 0.0–0.7)
Glucose, Bld: 91 mg/dL (ref 70–99)
Hemoglobin: 13.3 g/dL (ref 12.0–15.0)
Lymphocytes Relative: 41.9 % (ref 12.0–46.0)
MCHC: 34.4 g/dL (ref 30.0–36.0)
MCV: 83.6 fL (ref 78.0–100.0)
Monocytes Absolute: 0.2 10*3/uL (ref 0.1–1.0)
Neutro Abs: 2.7 10*3/uL (ref 1.4–7.7)
RBC: 4.64 M/uL (ref 3.87–5.11)
RDW: 14.3 % (ref 11.5–14.6)
Sed Rate: 11 mm/hr (ref 0–22)
Sodium: 140 meq/L (ref 135–145)
TSH: 0.99 microintl units/mL (ref 0.35–5.50)
Total Protein: 7.1 g/dL (ref 6.0–8.3)

## 2009-09-21 ENCOUNTER — Telehealth: Payer: Self-pay | Admitting: Cardiology

## 2009-09-22 ENCOUNTER — Telehealth (INDEPENDENT_AMBULATORY_CARE_PROVIDER_SITE_OTHER): Payer: Self-pay | Admitting: *Deleted

## 2009-09-24 ENCOUNTER — Ambulatory Visit: Payer: Self-pay | Admitting: Pulmonary Disease

## 2009-09-24 DIAGNOSIS — R61 Generalized hyperhidrosis: Secondary | ICD-10-CM

## 2009-09-24 DIAGNOSIS — K5730 Diverticulosis of large intestine without perforation or abscess without bleeding: Secondary | ICD-10-CM

## 2009-09-25 ENCOUNTER — Telehealth (INDEPENDENT_AMBULATORY_CARE_PROVIDER_SITE_OTHER): Payer: Self-pay | Admitting: *Deleted

## 2009-09-28 ENCOUNTER — Ambulatory Visit: Payer: Self-pay | Admitting: Pulmonary Disease

## 2009-09-29 ENCOUNTER — Telehealth: Payer: Self-pay | Admitting: Cardiology

## 2009-10-01 ENCOUNTER — Ambulatory Visit: Payer: Self-pay | Admitting: Cardiology

## 2009-10-01 DIAGNOSIS — R0600 Dyspnea, unspecified: Secondary | ICD-10-CM

## 2009-10-16 ENCOUNTER — Telehealth (INDEPENDENT_AMBULATORY_CARE_PROVIDER_SITE_OTHER): Payer: Self-pay | Admitting: *Deleted

## 2009-10-28 ENCOUNTER — Encounter: Payer: Self-pay | Admitting: Pulmonary Disease

## 2009-11-02 ENCOUNTER — Encounter: Admission: RE | Admit: 2009-11-02 | Discharge: 2009-11-02 | Payer: Self-pay | Admitting: Internal Medicine

## 2009-11-05 ENCOUNTER — Encounter: Payer: Self-pay | Admitting: Pulmonary Disease

## 2009-11-11 ENCOUNTER — Other Ambulatory Visit: Admission: RE | Admit: 2009-11-11 | Discharge: 2009-11-11 | Payer: Self-pay | Admitting: Interventional Radiology

## 2009-11-11 ENCOUNTER — Encounter: Admission: RE | Admit: 2009-11-11 | Discharge: 2009-11-11 | Payer: Self-pay | Admitting: Internal Medicine

## 2009-11-16 ENCOUNTER — Ambulatory Visit: Payer: Self-pay | Admitting: Cardiology

## 2009-11-16 ENCOUNTER — Encounter: Payer: Self-pay | Admitting: Cardiology

## 2009-11-16 ENCOUNTER — Ambulatory Visit: Payer: Self-pay | Admitting: Internal Medicine

## 2009-11-16 ENCOUNTER — Ambulatory Visit: Payer: Self-pay

## 2009-11-16 ENCOUNTER — Ambulatory Visit (HOSPITAL_COMMUNITY): Admission: RE | Admit: 2009-11-16 | Discharge: 2009-11-16 | Payer: Self-pay | Admitting: Cardiology

## 2009-11-20 ENCOUNTER — Encounter: Payer: Self-pay | Admitting: Pulmonary Disease

## 2010-02-22 ENCOUNTER — Encounter: Payer: Self-pay | Admitting: Pulmonary Disease

## 2010-03-18 ENCOUNTER — Telehealth (INDEPENDENT_AMBULATORY_CARE_PROVIDER_SITE_OTHER): Payer: Self-pay | Admitting: *Deleted

## 2010-04-12 ENCOUNTER — Encounter: Payer: Self-pay | Admitting: Pulmonary Disease

## 2010-05-10 ENCOUNTER — Telehealth: Payer: Self-pay | Admitting: Internal Medicine

## 2010-05-17 ENCOUNTER — Encounter: Payer: Self-pay | Admitting: Cardiology

## 2010-05-17 ENCOUNTER — Ambulatory Visit: Payer: Self-pay | Admitting: Cardiology

## 2010-05-17 ENCOUNTER — Telehealth (INDEPENDENT_AMBULATORY_CARE_PROVIDER_SITE_OTHER): Payer: Self-pay | Admitting: *Deleted

## 2010-07-03 ENCOUNTER — Encounter: Payer: Self-pay | Admitting: Obstetrics and Gynecology

## 2010-07-05 ENCOUNTER — Ambulatory Visit
Admission: RE | Admit: 2010-07-05 | Discharge: 2010-07-05 | Payer: Self-pay | Source: Home / Self Care | Attending: Pulmonary Disease | Admitting: Pulmonary Disease

## 2010-07-05 ENCOUNTER — Other Ambulatory Visit: Payer: Self-pay | Admitting: Pulmonary Disease

## 2010-07-05 ENCOUNTER — Encounter: Payer: Self-pay | Admitting: Pulmonary Disease

## 2010-07-05 LAB — BASIC METABOLIC PANEL
BUN: 11 mg/dL (ref 6–23)
CO2: 28 mEq/L (ref 19–32)
Calcium: 10.1 mg/dL (ref 8.4–10.5)
Chloride: 101 mEq/L (ref 96–112)
Creatinine, Ser: 0.6 mg/dL (ref 0.4–1.2)
GFR: 100.83 mL/min (ref >60.00)
Glucose, Bld: 83 mg/dL (ref 70–99)
Potassium: 4.3 mEq/L (ref 3.5–5.1)
Sodium: 137 mEq/L (ref 135–145)

## 2010-07-05 LAB — CBC WITH DIFFERENTIAL/PLATELET
Basophils Absolute: 0 10*3/uL (ref 0.0–0.1)
Basophils Relative: 0.4 % (ref 0.0–3.0)
Eosinophils Absolute: 0.1 10*3/uL (ref 0.0–0.7)
Eosinophils Relative: 1.2 % (ref 0.0–5.0)
HCT: 41.4 % (ref 36.0–46.0)
Hemoglobin: 14.1 g/dL (ref 12.0–15.0)
Lymphocytes Relative: 37.6 % (ref 12.0–46.0)
Lymphs Abs: 2.1 10*3/uL (ref 0.7–4.0)
MCHC: 34.1 g/dL (ref 30.0–36.0)
MCV: 83.5 fl (ref 78.0–100.0)
Monocytes Absolute: 0.2 10*3/uL (ref 0.1–1.0)
Monocytes Relative: 3.7 % (ref 3.0–12.0)
Neutro Abs: 3.1 10*3/uL (ref 1.4–7.7)
Neutrophils Relative %: 57.1 % (ref 43.0–77.0)
Platelets: 218 10*3/uL (ref 150.0–400.0)
RDW: 13.4 % (ref 11.5–14.6)
WBC: 5.4 10*3/uL (ref 4.5–10.5)

## 2010-07-05 LAB — HEPATIC FUNCTION PANEL
ALT: 15 U/L (ref 0–35)
AST: 19 U/L (ref 0–37)
Albumin: 4.8 g/dL (ref 3.5–5.2)
Alkaline Phosphatase: 99 U/L (ref 39–117)
Bilirubin, Direct: 0.2 mg/dL (ref 0.0–0.3)
Total Bilirubin: 1.5 mg/dL — ABNORMAL HIGH (ref 0.3–1.2)
Total Protein: 7.5 g/dL (ref 6.0–8.3)

## 2010-07-05 LAB — URINALYSIS, ROUTINE W REFLEX MICROSCOPIC
Bilirubin Urine: NEGATIVE
Hemoglobin, Urine: NEGATIVE
Nitrite: NEGATIVE
Total Protein, Urine: NEGATIVE
pH: 7 (ref 5.0–8.0)

## 2010-07-05 LAB — LIPID PANEL
Cholesterol: 171 mg/dL (ref 0–200)
HDL: 70.4 mg/dL (ref >39.00)
LDL Cholesterol: 82 mg/dL (ref 0–99)
Triglycerides: 92 mg/dL (ref 0.0–149.0)
VLDL: 18.4 mg/dL (ref 0.0–40.0)

## 2010-07-05 LAB — SEDIMENTATION RATE: Sed Rate: 9 mm/hr (ref 0–22)

## 2010-07-05 LAB — TSH: TSH: 1.2 u[IU]/mL (ref 0.35–5.50)

## 2010-07-06 ENCOUNTER — Telehealth: Payer: Self-pay | Admitting: Pulmonary Disease

## 2010-07-11 LAB — CONVERTED CEMR LAB: Vit D, 25-Hydroxy: 36 ng/mL (ref 30–89)

## 2010-07-13 NOTE — Assessment & Plan Note (Signed)
Summary: f81m   Visit Type:  6 months follow up Primary Provider:  Dr/ Alroy Dust  CC:  Episode really back chest pain a week before Thanksgiving. Took about 3 nitrog.- Upper respiratory infection.  History of Present Illness: Overall doing well.  Has URI symptoms at present.  Has had one round of antibiotics.  Thinks she needs another. Has a call into Dr. Kriste Basque.  Overall cardiac wise has been stable.  Had episode of chest pain on Sewptember 15 and once just before Thanksgiving, both relieved by nitroglycerin.  These are her usual.  Nothing progressive or serious.  Problems Prior to Update: 1)  Dyspnea  (ICD-786.05) 2)  Sweating  (ICD-780.8) 3)  Fatique and Malaise  (ICD-780.79) 4)  Hx of Bronchitis, Recurrent  (ICD-491.9) 5)  Hypertension  (ICD-401.9) 6)  Chest Pain  (ICD-786.50) 7)  Coronary Artery Disease  (ICD-414.00) 8)  Av Nodal Reentry Tachycardia  (ICD-427.89) 9)  Long Qt Syndrome  (ICD-759.89) 10)  Gerd  (ICD-530.81) 11)  Other Dysphagia  (ICD-787.29) 12)  Duodenitis, Without Hemorrhage  (ICD-535.60) 13)  Diverticulosis of Colon  (ICD-562.10) 14)  Irritable Bowel Syndrome  (ICD-564.1) 15)  Constipation, Chronic  (ICD-564.09) 16)  Hemorrhoids  (ICD-455.6) 17)  Fecal Incontinence  (ICD-787.6) 18)  Hx of Thyroid Nodule  (ICD-241.0) 19)  Degenerative Joint Disease  (ICD-715.90) 20)  Back Pain, Lumbar  (ICD-724.2) 21)  Fibromyalgia  (ICD-729.1) 22)  Chronic Pain Syndrome  (ICD-338.4) 23)  Neuropathy, Hx of  (ICD-V12.40) 24)  Anxiety  (ICD-300.00)  Current Medications (verified): 1)  Aspirin Adult Low Strength 81 Mg Tbec (Aspirin) .... Take 1 Tablet By Mouth Once A Day 2)  Nitrostat 0.4 Mg Subl (Nitroglycerin) .... As Needed For Chest Pain 3)  Triamterene-Hctz 37.5-25 Mg Tabs (Triamterene-Hctz) .... 1/2 Table Daily 4)  Dicyclomine Hcl 20 Mg Tabs (Dicyclomine Hcl) .... Take 1 Tablet By Mouth Once A Day 5)  Alprazolam 0.5 Mg  Tabs (Alprazolam) .... Take 1/2 Tablet To 1  Tablet By Mouth Three Times A Day As Needed 6)  Aleve 220 Mg Tabs (Naproxen Sodium) .... As Directed As Needed 7)  Ceftin 250 Mg Tabs (Cefuroxime Axetil) .... Take 1 Tablet By Mouth Two Times A Day  Allergies: 1)  ! Avelox (Moxifloxacin Hcl)  Vital Signs:  Patient profile:   65 year old female Height:      64 inches Weight:      152.75 pounds BMI:     26.31 Pulse rate:   64 / minute Pulse rhythm:   regular Resp:     18 per minute BP sitting:   124 / 82  (left arm) Cuff size:   large  Vitals Entered By: Vikki Ports (May 17, 2010 11:03 AM)  Physical Exam  General:  Well developed, well nourished, in no acute distress. Head:  normocephalic and atraumatic Eyes:  PERRLA/EOM intact; conjunctiva and lids normal. Lungs:  Clear bilaterally to auscultation and percussion.  No ronchii Heart:  PMI normal.  Normal S1 and S2 without murmur rub or gallop. Pulses:  pulses normal in all 4 extremities Extremities:  No clubbing or cyanosis. Neurologic:  Alert and oriented x 3.   EKG  Procedure date:  05/17/2010  Findings:      NSR.  QTc normal.   Impression & Recommendations:  Problem # 1:  CHEST PAIN (ICD-786.50) some recurrence, but seems stable.  multiple prior caths without critical disease.  Would favor continuing a conservative course of action here. Her updated medication  list for this problem includes:    Aspirin Adult Low Strength 81 Mg Tbec (Aspirin) .Marland Kitchen... Take 1 tablet by mouth once a day    Nitrostat 0.4 Mg Subl (Nitroglycerin) .Marland Kitchen... As needed for chest pain  Problem # 2:  LONG QT SYNDROME (ICD-759.89) Not prolonged at present.   Problem # 3:  Hx of BRONCHITIS, RECURRENT (ICD-491.9) calling Dr. Kriste Basque today. Her updated medication list for this problem includes:    Ceftin 250 Mg Tabs (Cefuroxime axetil) .Marland Kitchen... Take 1 tablet by mouth two times a day  Patient Instructions: 1)  Your physician recommends that you schedule a follow-up appointment in: 6 months 2)   Your physician recommends that you continue on your current medications as directed. Please refer to the Current Medication list given to you today. Prescriptions: NITROSTAT 0.4 MG SUBL (NITROGLYCERIN) as needed for chest pain  #25 x 6   Entered by:   Dossie Arbour, RN, BSN   Authorized by:   Ronaldo Miyamoto, MD, [facility]   Signed by:   Dossie Arbour, RN, BSN on 05/17/2010   Method used:   Electronically to        [facility] [id number] (retail)       [address]       [place], [place]  [address]       Ph: [phone]       Fax: [phone]   RxID:   [id number]

## 2010-07-13 NOTE — Progress Notes (Signed)
Summary: refill request for BP med  Phone Note Call from Patient Call back at Home Phone [phone]   Caller: Patient Call For: Kriste Basque Summary of Call: Pt request new refill on triam/hctz 37.5-25mg  sent to [facility] Initial call taken by: Zackery Barefoot CMA,  March 18, 2010 12:02 PM  Follow-up for Phone Call        Rx was sent to pharm.  LMOM for pt to be made aware this was done. Follow-up by: Vernie Murders,  March 18, 2010 2:15 PM

## 2010-07-13 NOTE — Progress Notes (Signed)
Summary: FYI LMTCB x1 -PT REURNED CALL  Phone Note Call from Patient Call back at Home Phone [phone]   Caller: Patient Call For: parrett Summary of Call: Pt wants TP to know she is been seen today by MW at 2:55p. Initial call taken by: Darletta Moll,  September 16, 2009 11:46 AM  Follow-up for Phone Call        [facility] Gweneth Dimitri RN  September 16, 2009 11:53 AM  PT RETURNED CALL FROM CRYSTAL/ TRIAGE. CALL PT BACK AT [phone] ([name] HOUSESITTING FOR SOMEONE) OR HER CELL # THAT NURSE ALREADY CALLED (PT HAS SOME ISSUES WITH THE RECEPTION ON HER CELL, HOWEVER, SO TRY OTHER # FIRST).  Tivis Ringer, CNA  September 16, 2009 12:49 PM  pt called and made an acute appt with MW today because she ahs been having weakness and SOB x few days. She states seh spoke to Dr. Riley Kill because she feels symptoms may be due to her heart and he advised her to keep appt with MW. She just wnated to make SN aware she was seeing MW today. Carron Curie CMA  September 16, 2009 1:03 PM   Additional Follow-up for Phone Call Additional follow up Details #1::        SN is aware Randell Loop CMA  September 16, 2009 2:28 PM

## 2010-07-13 NOTE — Letter (Signed)
Summary: [facility]  [facility]   Imported By: Lester Darrouzett 12/24/2009 08:50:13  _____________________________________________________________________  External Attachment:    Type:   Image     Comment:   External Document

## 2010-07-13 NOTE — Progress Notes (Signed)
Summary: congestion  Phone Note Call from Patient Call back at Home Phone [phone]   Caller: Patient Call For: [name] Summary of Call: Congestion in chest, need antibiotic called in.//[employer],[place] Initial call taken by: Darletta Moll,  June 24, 2009 10:14 AM  Follow-up for Phone Call        called, spoke with pt.  Pt  c/o chest congestion with little amount of dark yellow mucus, "high, high, fever," weakness, and SOB with activity since Friday. denies chest tightness and wheezing.  requesting abx to be called into [facility].  States she is usually given "cefuroxine 250mg  and this works well." Allergies-avelox will forward to SN-please advise.  Thanks!  Follow-up by: Gweneth Dimitri RN,  June 24, 2009 10:35 AM  Additional Follow-up for Phone Call Additional follow up Details #1::        per SN---ok for pt to have the cefuroxine 250mg   1 by mouth two times a day until gone.  called and spoke with pt and she is aware of meds sent to the pharmacy for her. Randell Loop CMA  June 24, 2009 4:23 PM     New/Updated Medications: CEFTIN 250 MG TABS (CEFUROXIME AXETIL) take one tablet by mouth two times a day until gone Prescriptions: CEFTIN 250 MG TABS (CEFUROXIME AXETIL) take one tablet by mouth two times a day until gone  #14 x 0   Entered by:   Randell Loop CMA   Authorized by:   Michele Mcalpine MD   Signed by:   Randell Loop CMA on 06/24/2009   Method used:   Electronically to        [facility] [id number] (retail)       [address]       [place], [place]  [address]       Ph: [phone]       Fax: [phone]   RxID:   [id number]
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
## 2010-07-13 NOTE — Progress Notes (Signed)
Summary: upper respiratory infection  Phone Note Call from Patient Call back at Home Phone [phone]   Caller: Patient Call For: nadel Reason for Call: Talk to Nurse Summary of Call: Patient calling saying she has an upper respiratory infection.  Asking for abx.  [facility] Initial call taken by: Lehman Prom,  May 10, 2010 8:22 AM  Follow-up for Phone Call        Pt last seen in our office on 09/24/2009 w/ no pending appts. LMOMTCB.Michel Bickers CMA  May 10, 2010 9:25 AM  The pt says she cannot come for an appt today but would like abx. She c/o nasal congestion with yellow mucus and a dry hacky cough for 1-2 weeks and sore throat. She denies any fever, incr SOB or wheezing. She also requests refill on Alprazolam be sent to her pharmacy. Pls advise.   Follow-up by: Michel Bickers CMA,  May 10, 2010 9:46 AM  Additional Follow-up for Phone Call Additional follow up Details #1::        per SN----ok for pt to have augmentin 875mg   #14  1 by mouth two times a day until gone--if not pcn allergic and use mucinex 600mg  2 by mouth two times a day with plenty of fluids and delsym 2 tsp by mouth two times a day as needed for cough---ok for the alprazolam 0.5mg   #90  1/2 to 1 tab by mouth three times a day  with no refills. thanks Randell Loop CMA  May 10, 2010 11:34 AM     Additional Follow-up for Phone Call Additional follow up Details #2::    Called, spoke with pt.  She verified no pcn allergy but states augmentin "does not do a thing for me."  Requesting cefuroxime 250mg  x 7 days.  SN, pls advise.  Thanks! Gweneth Dimitri RN  May 10, 2010 11:52 AM   per SN----ok for ceftin 250mg    #14  1 by mouth two times a day until gone. thanks Randell Loop CMA  May 10, 2010 2:28 PM   pt advised of recs and rx sent in. Carron Curie CMA  May 10, 2010 2:35 PM   New/Updated Medications: ALPRAZOLAM 0.5 MG  TABS (ALPRAZOLAM) Take 1/2 tablet to 1 tablet by  mouth three times a day as needed CEFTIN 250 MG TABS (CEFUROXIME AXETIL) Take 1 tablet by mouth two times a day Prescriptions: CEFTIN 250 MG TABS (CEFUROXIME AXETIL) Take 1 tablet by mouth two times a day  #14 x 0   Entered by:   Carron Curie CMA   Authorized by:   Michele Mcalpine MD   Signed by:   Carron Curie CMA on 05/10/2010   Method used:   Telephoned to ...       [facility] [address]* (retail)       [address]       [place], [place]  [address]       Ph: [phone]       Fax: [phone]   RxID:   [id number] ALPRAZOLAM 0.5 MG  TABS (ALPRAZOLAM) Take 1/2 tablet to 1 tablet by mouth three times a day as needed  #90 x 0   Entered by:   Carron Curie CMA   Authorized by:   Michele Mcalpine MD   Signed by:   Carron Curie CMA on 05/10/2010   Method used:   Telephoned to ...       [facility] [id number]* (retail)  [address]       [place], [place]  [address]       Ph: [phone]       Fax: [phone]   RxID:   [id number]

## 2010-07-13 NOTE — Consult Note (Signed)
Summary: [facility]   Imported By: Sherian Rein 12/03/2009 13:28:46  _____________________________________________________________________  External Attachment:    Type:   Image     Comment:   External Document

## 2010-07-13 NOTE — Progress Notes (Signed)
Summary: cough/ congestion  Phone Note Call from Patient Call back at Home Phone [phone]   Caller: Patient Call For: nadel Summary of Call: pt states that she is till coughing/ congested, but better. however, having nausea he believes if due to abx. no fever. pls. advise. [facility] [address] [place] Initial call taken by: Tivis Ringer, CNA,  May 17, 2010 10:02 AM  Follow-up for Phone Call        Spoke with pt.  She states that she finished the ceftin we called in for her on 11/28 and is some better, but still has some prod cough and nasal drainage will dark yellow mucus.  She states that she is having alot of PND that is making her feel nauseated.  She states "I think I need another round to knock this out". Pls advise, thanks! allergic to avelox. Follow-up by: Vernie Murders,  May 17, 2010 10:33 AM  Additional Follow-up for Phone Call Additional follow up Details #1::        per SN----use mucinex 600mg    2 by mouth  two times a day with lots of fluids, no more abx for right now----if not getting better will need rov.  thanks Randell Loop [facility]  May 17, 2010 1:57 PM   The patient says she cannot take any OTC medications due to her hx of long QT's. She was offered an appt w/ TP on 05/18/2010 but did not want to sch  before msg sent back to SN about long QT's. Pls advise. Additional Follow-up by: Michel Bickers CMA,  May 17, 2010 2:06 PM    Additional Follow-up for Phone Call Additional follow up Details #2::    per SN----she can use the plain mucinex for this----if she is not comfortable taking the plain mucinex then she will need to check with Dr. Riley Kill on his recs for otc meds. thanks Randell Loop CMA  May 17, 2010 2:20 PM     Additional Follow-up for Phone Call Additional follow up Details #3:: Details for Additional Follow-up Action Taken: Spoke with pt and notified of the above recs per SN.  Pt verbalized understanding. Additional Follow-up by:  Vernie Murders,  May 17, 2010 2:23 PM

## 2010-07-13 NOTE — Progress Notes (Signed)
Summary: refill  Phone Note Call from Patient Call back at [facility] Phone [phone]   Caller: Patient Call For: nadel Summary of Call: refills:dicyclomine 20mg  nexium 40mg (wants generic) [facility] Initial call taken by: Darletta Moll,  August 10, 2009 9:37 AM  Follow-up for Phone Call        pt aware will need appt before refills can be sent scheduled pt to see sn 10/20/09 at 12:00 and refills were then sent to pharmacy, advised pt she will need to see sn every year to remain an active pt of his Follow-up by: Philipp Deputy CMA,  August 10, 2009 11:11 AM    Prescriptions: NEXIUM 40 MG CPDR (ESOMEPRAZOLE MAGNESIUM) 1 by mouth once daily  #30 x 1   Entered by:   Philipp Deputy CMA   Authorized by:   Michele Mcalpine MD   Signed by:   Philipp Deputy CMA on 08/10/2009   Method used:   Electronically to        [facility] [id number]* (retail)       [address] [place] [address]       [place], [place]  [address]       Ph: [phone]       Fax: [phone]   RxID:   [id number] DICYCLOMINE HCL 20 MG TABS (DICYCLOMINE HCL) take one tablet by mouth every 6 hours as needed for abd cramping  #30 x 1   Entered by:   Philipp Deputy CMA   Authorized by:   Michele Mcalpine MD   Signed by:   Philipp Deputy CMA on 08/10/2009   Method used:   Electronically to        [facility] [id number]* (retail)       [address] [place], [place]  [address]       Ph: [phone]       Fax: [phone]   RxID:   [id number]

## 2010-07-13 NOTE — Letter (Signed)
Summary: [facility]   Imported By: Sherian Rein 12/03/2009 13:32:19  _____________________________________________________________________  External Attachment:    Type:   Image     Comment:   External Document
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
## 2010-07-13 NOTE — Consult Note (Signed)
Summary: [name]   Imported By: Lennie Odor 02/25/2010 15:20:15  _____________________________________________________________________  External Attachment:    Type:   Image     Comment:   External Document

## 2010-07-13 NOTE — Progress Notes (Signed)
Summary: extreme fatigue  Phone Note Call from Patient Call back at [facility] Phone [phone]   Caller: Patient Call For: nadel Summary of Call: pt c/o fatigue x 2 wks. she saw dr wert on 09/16/09. she is no better. she states that she is worse; so fatigued that she can't walk any distance w/ out having to rest. too tired to do anything; "just plain exhausted". wants to see dr nadel asap. can not wait for her cpx scheduled next month. is getting very concerned about this. says she also called her cardiologist and left a msg. but wants to see dr Kriste Basque in the meantime- asap.  Initial call taken by: Tivis Ringer, CNA,  September 22, 2009 3:19 PM  Follow-up for Phone Call        Please advise if SN can see pt sooner that her cpx in May. Thanks Vernie Murders  September 22, 2009 3:24 PM   have pt come in on thursday at 2:30pm.  make sure she brings all of her meds with her please  thanks Randell Loop CMA  September 22, 2009 3:40 PM   Additional Follow-up for Phone Call Additional follow up Details #1::        pt scheduled to see sn thursday at 2:30--pt aware of appt also aware to bring all meds to appt Additional Follow-up by: Philipp Deputy CMA,  September 22, 2009 4:05 PM
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
## 2010-07-13 NOTE — Letter (Signed)
Summary: [facility]   Imported By: Sherian Rein 12/03/2009 13:31:13  _____________________________________________________________________  External Attachment:    Type:   Image     Comment:   External Document
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
## 2010-07-13 NOTE — Progress Notes (Signed)
Summary: appt question-LMTCBx3  Phone Note Call from Patient Call back at Home Phone [phone]   Caller: Patient Call For: nadel Summary of Call: Wants to know if she should still keep appt on 5/10. Initial call taken by: Darletta Moll,  Oct 16, 2009 4:09 PM  Follow-up for Phone Call        [facility].Michel Bickers CMA  Oct 16, 2009 4:12 PM  LMTCBx2. Carron Curie CMA  Oct 19, 2009 11:34 AM   Per Marliss Czar, no need to keep this appt.  LM[facility] TCB. Boone Master CNA  Oct 19, 2009 2:52 PM   Additional Follow-up for Phone Call Additional follow up Details #1::        pt aware and I cancelled the appt Additional Follow-up by: Lacinda Axon,  Oct 20, 2009 8:09 AM

## 2010-07-13 NOTE — Progress Notes (Signed)
Summary: c/o fatigue, sob  Phone Note Call from Patient Call back at Home Phone [phone]   Caller: Patient Reason for Call: Talk to Nurse Summary of Call: Per pt calling, seen Dr. Sherene Sires & Dr. Kriste Basque, did blood work- lime disease.  c/o going in to 4 weeks fatigue, sob. Initial call taken by: Lorne Skeens,  September 29, 2009 8:58 AM  Follow-up for Phone Call        Left message to call back. Julieta Gutting, RN, BSN  September 29, 2009 11:29 AM  returning call, Migdalia Dk  September 29, 2009 11:33 AM   I spoke with the pt and she has been having SOB and fatique for the past month.  The pt has seen Dr Sherene Sires and Dr Kriste Basque about these symptoms and has not found a cause for her symptoms. The pt said she cannot even exercise because of these symptoms.  I spoke with Dr Riley Kill about the pt's symptoms and he would like to see the pt in the office.  I arranged for the pt to see Dr Riley Kill on 4/21 at 1:00. Follow-up by: Julieta Gutting, RN, BSN,  September 29, 2009 12:03 PM

## 2010-07-13 NOTE — Letter (Signed)
Summary: [facility]   Imported By: Sherian Rein 04/24/2010 10:36:22  _____________________________________________________________________  External Attachment:    Type:   Image     Comment:   External Document

## 2010-07-13 NOTE — Progress Notes (Signed)
Summary: fatique/ also referral info  Phone Note Call from Patient Call back at Home Phone [phone]   Caller: Patient Call For: nadel Summary of Call: pt calling re: 2 matters: #1- pt has been "very fatigued" for the past 2-3 wks. also wanted dr Kriste Basque to know that she found a tick on her lower groin area (but removed the whole tick) 2 nights ago. pt also wanted to leave info so that someone could schedule an appt with a rheumaroid arthritis dr. she has suggested dr Water engineer or dr Theron Arista levitin [phone]. pt may be reached at home # above Initial call taken by: Tivis Ringer, CNA,  September 25, 2009 8:53 AM  Follow-up for Phone Call        The pt has called back with 2 names for Rheumatology referral. She is also complainigt of increased fatigue for 2 weeks and to let you know about tick removal from her groin area a few days ago. Please advise.Michel Bickers [facility]  September 25, 2009 9:16 AM  Additional Follow-up for Phone Call Additional follow up Details #1::        per SN---ok for the names of the doctors that she gave us---we will forward this info to rhonda so she can get that set up for the pt---ask her if she will come to the lab here to have lyme titers done.  let me know and i will put that order in for her.  thanks Randell Loop CMA  September 25, 2009 9:47 AM     Additional Follow-up for Phone Call Additional follow up Details #2::    The patient can't come for labs until Monday 09/28/2009. Michel Bickers CMA  September 25, 2009 10:06 AM   ok i will put them in for monday for her.  thanks Randell Loop CMA  September 25, 2009 10:07 AM

## 2010-07-13 NOTE — Progress Notes (Signed)
Summary: fatigue x 2 weeks  Phone Note Call from Patient Call back at Home Phone [phone]   Caller: Patient Reason for Call: Talk to Nurse Summary of Call: c/o fatigue x 2 weeks. went to dr. Sherene Sires on last week, done some blood work, all is normal.  Initial call taken by: Lorne Skeens,  September 21, 2009 2:04 PM  Follow-up for Phone Call        This message was not sent to my Desktop for a call back.  I noticed this message when the pt called on 09/29/09. Marlowe Kays had routed this message to [employer].  Follow-up by: Julieta Gutting, RN, BSN,  September 29, 2009 1:38 PM

## 2010-07-15 NOTE — Progress Notes (Signed)
Summary: need to call [employer] for medicaition  Phone Note Call from Patient   Caller: Patient Call For: dr Arleth Mccullar Summary of Call: Patient phoned stated that she was seen yesterday she stated that they discussed Dicyclomine 20 MG her insurance wants her to take a different medication her pharmicist sugguested Loperamid over the counter liquid but she doesnt think that will work. She received a letter from [facility] that states in order for them to cover the doctor has to contact them at [phone].   Initial call taken by: Vedia Coffer,  July 06, 2010 11:13 AM  Follow-up for Phone Call        PT's insurance has denied Dicyclomine beacuse it is non formulary.  They have suggested pt try Loperamide 2 mg.  PT reports that she doesn't think this will help and it will still cost her $44/mth.  Please advise if you want [place] to appeal this or replace with drug that was suggested. Abigail Miyamoto RN  July 06, 2010 11:39 AM   Additional Follow-up for Phone Call Additional follow up Details #1::        per SN----call her pharmacy please   ask about bentyl 20mg       1 by mouth three times a day as needed for abd cramping---how much is the co-pay?  how much will her insurance cover? Randell Loop CMA  July 06, 2010 12:18 PM   called and spoke with matt at [facility] in [place] and he stated that they can fill the med---dicyclomine 20mg    #90   for $6.00.  called and lmom for pt to return my call to make her aware Randell Loop [facility]  July 06, 2010 4:22 PM     Additional Follow-up for Phone Call Additional follow up Details #2::    called [employer] to ask about the dicyclomine 20mg ---for the pt and [employer] stated that the pt can take the rx to the [facility] and get this filled for a 30 day supply for about $5.00.   called and spoke with pt and she is aware that the [facility] in [place]---they wll fill her rx for #90 for 6.00.  pt is aware and will take her rx there to be  filled Randell Loop CMA  July 07, 2010 10:30 AM

## 2010-07-21 ENCOUNTER — Telehealth (INDEPENDENT_AMBULATORY_CARE_PROVIDER_SITE_OTHER): Payer: Self-pay | Admitting: *Deleted
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
## 2010-07-29 NOTE — Progress Notes (Signed)
Summary: lab resutls  Phone Note Call from Patient Call back at Home Phone [phone]   Caller: Patient Call For: nadel Summary of Call: patient phoned and could not get her lab results over the phone when she punched her number in the system said that her results were not available. Please call the patient with her results she can be reached at [phone] Initial call taken by: Vedia Coffer,  July 21, 2010 9:04 AM  Follow-up for Phone Call        Called PT and her msg from SN was in fact on there.  I called her and had her repeat back to me the number that she entered and it was incorrect.  I read her back the correct number and she verbalized understanding.  Follow-up by: Vernie Murders,  July 21, 2010 9:23 AM

## 2010-08-09 ENCOUNTER — Other Ambulatory Visit: Payer: Self-pay | Admitting: Pulmonary Disease

## 2010-08-09 DIAGNOSIS — Z1231 Encounter for screening mammogram for malignant neoplasm of breast: Secondary | ICD-10-CM

## 2010-08-12 ENCOUNTER — Telehealth: Payer: Self-pay | Admitting: Pulmonary Disease

## 2010-08-19 NOTE — Progress Notes (Signed)
Summary: cough  Phone Note Call from Patient Call back at Home Phone [phone]   Caller: Patient Call For: Jacqueline Spofford Summary of Call: Pt c/o congestion cough dark/yellow mucus since last week request abx pls advise.//[facility] [place] Initial call taken by: Darletta Moll,  August 12, 2010 9:04 AM  Follow-up for Phone Call        [email or web address] x 1 [facility]  August 12, 2010 9:34 AM   Spoke with pt.  She is c/o sneezing, cough at night, and chest congestion.  Cough is prod with yellow sputum.  She denies any fever, SOB, CP or other c/o's.  Would like abx to be called in.  Allergic to avelox.   Follow-up by: Vernie Murders,  August 12, 2010 9:44 AM  Additional Follow-up for Phone Call Additional follow up Details #1::        per SN----ok for pt to have augmentin 875mg   #14  1 by mouth two times a day until gone and pred dosepak 5mg / 6 day pack  #1  take as directed.  this has been sent to the pts pharmacy and pt is aware Randell Loop CMA  August 12, 2010 12:02 PM     New/Updated Medications: AUGMENTIN 875-125 MG TABS (AMOXICILLIN-POT CLAVULANATE) take one tablet by mouth two times a day until gone PREDNISONE (PAK) 5 MG TABS (PREDNISONE) take as directed---please give a 6 day pack Prescriptions: PREDNISONE (PAK) 5 MG TABS (PREDNISONE) take as directed---please give a 6 day pack  #1 pack x 0   Entered by:   Randell Loop CMA   Authorized by:   Michele Mcalpine MD   Signed by:   Randell Loop CMA on 08/12/2010   Method used:   Electronically to        [facility] [id number] (retail)       [address]       [place], [place]  [address]       Ph: [phone]       Fax: [phone]   RxID:   [id number] AUGMENTIN 875-125 MG TABS (AMOXICILLIN-POT CLAVULANATE) take one tablet by mouth two times a day until gone  #14 x 0   Entered by:   Randell Loop CMA   Authorized by:   Michele Mcalpine MD   Signed by:   Randell Loop CMA on 08/12/2010   Method used:   Electronically to        [facility] [id number] (retail)       [address]       [place], [place]  [address]       Ph: [phone]       Fax: [phone]   RxID:   [id number]

## 2010-08-27 ENCOUNTER — Telehealth: Payer: Self-pay | Admitting: Pulmonary Disease

## 2010-08-30 ENCOUNTER — Ambulatory Visit
Admission: RE | Admit: 2010-08-30 | Discharge: 2010-08-30 | Disposition: A | Discharge status: Home / Self Care | Payer: Medicare PPO | Source: Ambulatory Visit | Attending: Pulmonary Disease | Admitting: Pulmonary Disease

## 2010-08-30 ENCOUNTER — Other Ambulatory Visit: Payer: Self-pay | Admitting: Pulmonary Disease

## 2010-08-30 DIAGNOSIS — N6320 Unspecified lump in the left breast, unspecified quadrant: Secondary | ICD-10-CM

## 2010-08-30 DIAGNOSIS — Z1231 Encounter for screening mammogram for malignant neoplasm of breast: Secondary | ICD-10-CM

## 2010-08-31 NOTE — Progress Notes (Signed)
Summary: refills nexium,alprazol,dicyclomine,triamterene/hcts  Phone Note Call from Patient Call back at Home Phone [phone]   Caller: Patient Call For: Shahram Alexopoulos Reason for Call: Refill Medication, Talk to Nurse Summary of Call: Patient calling saying at last ov with Kriste Basque he printed out refills for all her medications.  She has since lost paper rx's.  She says she is needing "all her meds" refilled.  [facility] Initial call taken by: Lehman Prom,  August 27, 2010 2:06 PM  Follow-up for Phone Call        Called and spoke with pt and she states her rx that was given to her at last visit 07/07/10 she has lost. pt states she has been going through a lot and can't remember where she put the prescriptions and requesting we send them in for Nexium, Alprazolam, Dicyclomine, trianteren/hctz.  Dr. Kriste Basque please advise if okay to resend pt's refills. Thanks Carver Fila  August 27, 2010 4:02 PM   Additional Follow-up for Phone Call Additional follow up Details #1::        all rx have been faxed to pts pharmacy and pt is aware Randell Loop CMA  August 27, 2010 4:35 PM     Prescriptions: NEXIUM 40 MG CPDR (ESOMEPRAZOLE MAGNESIUM) take 1 tab by mouth once daily...  #30 x 12   Entered by:   Randell Loop CMA   Authorized by:   Michele Mcalpine MD   Signed by:   Randell Loop CMA on 08/27/2010   Method used:   Printed then faxed to ...       [facility] [address] (retail)       [address]       [place], [place]  [address]       Ph: [phone]       Fax: [phone]   RxID:   [id number] ALPRAZOLAM 0.5 MG  TABS (ALPRAZOLAM) Take 1/2 tablet to 1 tablet by mouth three times a day as needed  #90 x 12   Entered by:   Randell Loop CMA   Authorized by:   Michele Mcalpine MD   Signed by:   Randell Loop CMA on 08/27/2010   Method used:   Printed then faxed to ...       [facility] [address] (retail)       [address]       [place], [place]  [address]       Ph: [phone]       Fax:  [phone]   RxID:   [id number] DICYCLOMINE HCL 20 MG TABS (DICYCLOMINE HCL) Take 1 tablet by mouth up to 3 times daily as needed for cramping...  #90 x 12   Entered by:   Randell Loop CMA   Authorized by:   Michele Mcalpine MD   Signed by:   Randell Loop CMA on 08/27/2010   Method used:   Printed then faxed to ...       [facility] [address] (retail)       [address]       [place], [place]  [address]       Ph: [phone]       Fax: [phone]   RxID:   [id number] TRIAMTERENE-HCTZ 37.5-25 MG TABS (TRIAMTERENE-HCTZ) take 1/2 tablet daily  #30 x 12   Entered by:   Randell Loop CMA   Authorized by:   Michele Mcalpine MD  Signed by:   Randell Loop CMA on 08/27/2010   Method used:   Printed then faxed to ...       [facility] [address] (retail)       [address]       [place], [place]  [address]       Ph: [phone]       Fax: [phone]   RxID:   [id number]

## 2010-09-06 ENCOUNTER — Ambulatory Visit
Admission: RE | Admit: 2010-09-06 | Discharge: 2010-09-06 | Disposition: A | Discharge status: Home / Self Care | Payer: Medicare PPO | Source: Ambulatory Visit | Attending: Pulmonary Disease | Admitting: Pulmonary Disease

## 2010-09-06 ENCOUNTER — Other Ambulatory Visit: Payer: Self-pay | Admitting: Pulmonary Disease

## 2010-09-06 DIAGNOSIS — N6320 Unspecified lump in the left breast, unspecified quadrant: Secondary | ICD-10-CM

## 2010-09-14 ENCOUNTER — Other Ambulatory Visit: Payer: Self-pay | Admitting: Obstetrics and Gynecology
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
## 2010-10-26 NOTE — Assessment & Plan Note (Signed)
[facility]                            CARDIOLOGY OFFICE NOTE   AVICE, FUNCHESS                      MRN:          [id number]  DATE:02/27/2008                            DOB:          May 03, 1946    [name] is in for a followup visit.  In general, she is stable.  She is  not having any ongoing progressive chest pain.  She had an episode about  2 months ago.  She went through her surgery, in the rehab, and  symptomatically is a lot better.  She seems much happier.  Her blood  pressure is up but she has cut back on her atenolol.   CURRENT MEDICATIONS:  1. Femhrt 0.5/2.5 daily.  2. Aspirin 81 mg daily.  3. Triamterene and hydrochlorothiazide 37.5/25 one daily.  4. Atenolol 50 mg one-half tablet daily.   PHYSICAL EXAMINATION:  GENERAL:  She is alert and oriented in no  distress.  LUNGS:  Fields are clear.  CARDIAC:  Rhythm is regular without a significant murmur.  VITAL SIGNS:  The blood pressure is 150/100, and when it was rechecked  by me carefully, it was similar to this number although the diastolic  was 90.  EXTREMITIES:  There was no extremity edema.   Her electrocardiogram demonstrates normal sinus rhythms within normal  limits.  The QT interval is normal at 424 milliseconds.   IMPRESSION:  1. A long history of intermittent recurrent chest discomfort with      extensive workup.  2. Hypertension, worse controlled since backing off on atenolol.   PLAN:  The patient will return to see me in 6 months in followup.  She  is complaining of some heat intolerance, a voracious appetite, and she  has asked that we do a thyroid hormone level, we will do that including  a free T4 and TSH.     Arturo Morton. Riley Kill, MD, [facility]  Electronically Signed    TDS/MedQ  DD: 02/27/2008  DT: 02/28/2008  Job #: [id number]   cc:   Lonzo Cloud. Kriste Basque, MD
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
## 2010-10-29 NOTE — Op Note (Signed)
[facility]  Patient:    Renee [name], Renee [name]                      MRN: [id number] Proc. Date: 01/25/01 Adm. Date:  [id number] Attending:  Lendon Colonel                           Operative Report  PREOPERATIVE DIAGNOSIS:  Persistent postmenopausal bleeding.  POSTOPERATIVE DIAGNOSIS:  Endometrial polyps.  OPERATION PERFORMED:  Hysteroscopy with resection of endometrial polyps.  DESCRIPTION OF PROCEDURE:  The patient was placed in the lithotomy position, prepped and draped in the usual fashion. The cervix was carefully dilated, hysteroscope was inserted into the uterus. Multiple small polyps posteriorly which were resected with the resectoscope and cauterized. No unusual blood loss occurred. All the resecting material was sent to the lab for study. She was awakened and carried to the recovery room in good condition. DD:  01/25/01 TD:  01/25/01 Job: [id number]

## 2010-10-29 NOTE — H&P (Signed)
Renee [name], Renee [name]               ACCOUNT NO.:  [id number]   MEDICAL RECORD NO.:  [id number]          PATIENT TYPE:  INP   LOCATION:  1826                         FACILITY:  [facility]   PHYSICIAN:  Joellyn Rued, P.A. [facility] DATE OF BIRTH:  18-May-1946   DATE OF ADMISSION:  09/23/2005  DATE OF DISCHARGE:                                HISTORY & PHYSICAL   Dictation ends at this point.      Joellyn Rued, P.A. [facility]     EW/MEDQ  D:  09/23/2005  T:  09/23/2005  Job:  [id number]
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
## 2010-10-29 NOTE — Assessment & Plan Note (Signed)
[facility]                              CARDIOLOGY OFFICE NOTE   Renee [name], Renee [name]                      MRN:          [id number]  DATE:03/02/2006                            DOB:          1945-10-24    Renee [name] is in for followup.  In general, she has been relatively stable.  Her major complaints now relate to her neck and some foot numbness.  She is  being seen by neurology at [facility], and apparently had an MRI.  She has  had a rare episode of chest pain.  She has recently been see in the  emergency room for an episode of shortness of breath.   PHYSICAL EXAMINATION:  VITAL SIGNS:  On examination today, the blood  pressure was 132/88, pulse is 95.  LUNGS:  The lung fields are entirely clear.  CARDIAC:  Regular.  EXTREMITIES:  The extremities did not reveal significant edema.   ELECTROCARDIOGRAM:  The EKG reveals normal sinus rhythm, essentially within  normal limits, although the QT is borderline.   Overall, Renee [name] is stable.  With her elevated blood pressure and mild  increase in heart rate, we will increase her atenolol to 50 mg daily, which  she thinks would be a good idea, as well.  She will let us know about her  work-up in [place].  We will see her back in followup in 6 months.                              Arturo Morton. Riley Kill, MD, [facility]    TDS/MedQ  DD:  03/02/2006  DT:  03/04/2006  Job #:  [id number]   cc:   Lonzo Cloud. Kriste Basque, MD
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
## 2010-10-29 NOTE — Assessment & Plan Note (Signed)
[facility]                            CARDIOLOGY OFFICE NOTE   Renee, [name]                      MRN:          [id number]  DATE:08/29/2006                            DOB:          [birth year]    Renee [name] is in for a followup.  In general, she has been stable.  She  has had one episode of chest pain since I saw her last, but overall has  done reasonably well.  Her biggest limitation is that of moderate  fatigue.  She has generally been inactive, which has been unusual for  her in the past.   MEDICATIONS:  Include:  1. Femhrt.  2. Nexium 40 daily.  3. Mobic 15 mg daily.  4. Xanax 0.5 mg, one to two daily.  5. Enteric-coated aspirin 325 mg daily.  6. Citrucel one daily.  7. MiraLax 176 daily.  8. Atenolol 50 daily.   PHYSICAL EXAMINATION:  VITAL SIGNS:  Her blood pressure is 124/76, pulse  75, weight 153, lung fields clear.  CARDIAC:  Rhythm is regular.  No murmur, rub or gallop.  EXTREMITIES:  Without edema.   Electrocardiogram demonstrates normal sinus rhythm.  There is no  definite infarct.  The QTC is 451 msec.   IMPRESSION:  1. History of chronic recurrent chest pain with extensive evaluation.  2. Very mild coronary abnormality, proximal circumflex coronary artery      with normal LV function.  3. History of probable chronic acid reflux.  4. History of neuropathy.   PLAN:  1. Continue followup with Dr. Kriste Basque.  2. Continue current medical regimen.     Renee [name]. Riley Kill, MD, [facility]  Electronically Signed    TDS/MedQ  DD: 08/29/2006  DT: 08/30/2006  Job #: [id number]

## 2010-10-29 NOTE — Cardiovascular Report (Signed)
[place]  Patient:    NOE, PITTSLEY Visit Number: [id number] MRN: [id number]          Service Type: MED Location: 2000 2014 01 Attending Physician:  Ronaldo Miyamoto Dictated by:   Arturo Morton Riley Kill, M.D. [facility] Proc. Date: 07/24/01 Admit Date:  07/24/2001 Discharge Date: 07/27/2001   CC:         Lorin Picket M. Kriste Basque, M.D. [facility]  CV Laboratory   Cardiac Catheterization  INDICATIONS:  Ms. Paczkowski is a very pleasant 65 year old well known to me.  She has had a long history of chest pain with previous cardiac catheterization in 1997.  She also has a significant family history of coronary artery disease. She has had recurrent episodes of chest pain and these episodes have intensified over the past few weeks.  She says she has up to three episodes per week and the patient was pushing her to be re-evaluated.  She was seen in the office and subsequently set up for cardiac catheterization given the recurrent episodes of pain.  PROCEDURES: 1. Left heart catheterization. 2. Selective coronary angiography. 3. Selective left ventriculography.  DESCRIPTION OF PROCEDURE:  The patient was brought to the catheterization lab and prepped and draped in the usual fashion.  We attempted to cannulate the right femoral artery but there was some difficulty gaining access, and therefore we moved to the left femoral artery.  Through an anterior puncture, the left femoral artery was easily entered, and a 5 French sheath was placed. Views of the left and right coronary arteries were obtained in multiple angiographic projections.  There was no damping whatsoever of the diagnostic catheters.  On review of the films there was some slight tapered narrowing of the ostium of the circumflex with a minimal degree of haziness at this location.  Intracoronary nitroglycerin was given and further views of this area taken.  We questioned whether this could be proximal focal plaque  or even possibly related to a catheter tip spasm or even a small degree of carinal disruption but it did not appear to be high grade.  Some follow-up views were obtained with a JL-35 guide, 6 [place], and with the vessel being widely patent no further studies were obtained.  I did call Dr. Juanda Chance into the room, and we discussed the possibility of an intravascular ultrasound but elected not to do this.  All catheters were subsequently removed, and the patient taken to the holding area where the femoral sheath was removed with direct hemostasis.  The patient was taken back to the room in satisfactory clinical condition.  HEMODYNAMICS:  The central aortic pressure was 169/107, mean 134. LV 164/10/18.  No aortic to left ventricular gradient on pullback.  ANGIOGRAPHIC DATA:  The left main coronary artery was free of significant disease.  The left anterior descending artery coursed to the apex and provided two major bifurcating diagonal branches.  The LAD proper tapered after the second diagonal coursed to the apex.  There were multiple septal perforators.  The LAD and its branches appeared to be free of significant disease.  The circumflex provided two proximal marginal branches which were both quite small, a modest size third marginal branch and a larger fourth bifurcating marginal branch.  There was a small AV circumflex.  At the origin of the circumflex vessel there was some tapered narrowing that appears to overly even the origin of the tiny first marginal branch and demonstrates about 30% luminal reduction.  There is minimal haziness  on the ventricular side of the vessel which could be from orientation although a small amount of intimal disruption could not be excluded.  The circumflex itself was widely patent with excellent flow and no luminal compromise.  The vessel otherwise appeared to be free of significant disease.  The right coronary artery was a dominant vessel with slight  tapered narrowing at the ostium possibly due to some catheter tip spasm.  The vessel throughout is smooth providing a posterior descending and posterior lateral branch.  LEFT VENTRICULOGRAPHY:  Ventriculography in the RAO projection reveals a preserved global systolic function.  The ejection fraction could not be accurately calculated because of ventricular ectopy but appeared to be well preserved.  CONCLUSIONS: 1. Preserved overall left ventricular function. 2. Mild tapered ostial narrowing of the circumflex artery as noted in the    above text.  PLAN:  I have reviewed the films with multiple observers.  At the present time, the circumflex does not appear to be high grade, and there is excellent flow with gentle tapered narrowing and slight haziness.  The addition of an antiplatelet agent will be given, and the patient treated conservatively.  I will get a CRP.  ADDENDUM:  On the final two shots the patient began intracoronary nitroglycerin. Dictated by:   Arturo Morton Riley Kill, M.D. [facility] Attending Physician:  Ronaldo Miyamoto DD:  07/27/01 TD:  07/28/01 Job: [id number]
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
## 2010-10-29 NOTE — Assessment & Plan Note (Signed)
[facility]                              CARDIOLOGY OFFICE NOTE   Renee [name], Renee [name]                      MRN:          [id number]  DATE:10/18/2005                            DOB:          12/27/1945    REASON FOR VISIT:  Renee [name] is seen in the office today in followup.  She  was admitted to the hospital briefly overnight on September 23, 2005.  She  presented with some chest discomfort.  She last underwent cardiac  catheterization in 2005, and this actually looked quite good.  The mild  abnormality in the proximal circumflex was absolutely unchanged.  She did  present with elevated blood pressure.  She has had a previous CT which did  not suggest pulmonary embolism or aortic dissection.  Her cardiac markers  were essentially unremarkable.  She had a myocardial perfusion study done  which demonstrated some breast attenuation but otherwise unremarkable.  It  was an adenosine study, and ejection fraction was calculated at 68%.  Radioisotope uptake was fairly uniform in all major segments.  The patient  also had a renal ultrasound done.  The renal ultrasound revealed a normal  abdominal aortic caliber without focal dilatation.  The kidneys were  symmetrical in size, and the renal arteries were felt to be widely patent  bilaterally, without evidence of significant stenosis.  She still has some  intermittent mild discomfort at times, and she remains on atenolol and  aspirin.   PHYSICAL EXAMINATION:  VITAL SIGNS:  Blood pressure 130/80, pulse 70.  LUNGS:  Lung fields are clear.  CARDIAC:  Regular rhythm.   DIAGNOSTIC STUDIES:  The electrocardiogram demonstrates normal sinus rhythm  and is unremarkable.  The QT interval today is 429 milliseconds.   IMPRESSION:  Renee [name] is stable.  She has had a complete workup again which  has included a myocardial perfusion study.  She has also had renal  ultrasound to evaluate her hypertension.  She does get somewhat  anxious, but  understandably so with recurrent episodes of chest discomfort that she has  had over the years.  At the present time, I would continue her on medical  management.  Her blood pressure right now seems perfectly normal.  Chest x-  ray was unremarkable at the time of her admission.  It is still not entirely  clear why she gets the episodes that she does, but I will continue to follow  her regularly as an outpatient.                              Arturo Morton. Riley Kill, MD, [facility]    TDS/MedQ  DD:  01/23/2006  DT:  01/23/2006  Job #:  [id number]
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
## 2010-10-29 NOTE — Assessment & Plan Note (Signed)
[facility]                                   ON-CALL NOTE   CHARNAE, LILL                        MRN:          [id number]  DATE:04/08/2006                            DOB:          Oct 07, 1945    TIME OF CALL:  9:20 a.m.   PHONE NUMBER:  [phone]   PROBLEM:  This patient of Dr. Alroy Dust called reporting that she had  started cefdinir 2 days ago and was now experiencing joint and low back  pains such that she needed Vicodin.  The medicine had been prescribed to  treat upper respiratory infection symptoms, which are currently much  improved.   PLAN:  She was to discontinue the cefdinir and observe for resolution of  symptoms, checking with Dr. Kriste Basque on Monday.     Clinton D. Maple Hudson, MD, FCCP, FACP    CDY/MedQ  DD: 04/09/2006  DT: 04/10/2006  Job #: [id number]   cc:   Lonzo Cloud. Kriste Basque, MD

## 2010-10-29 NOTE — Discharge Summary (Signed)
Renee [name]               ACCOUNT NO.:  [id number]   MEDICAL RECORD NO.:  [id number]          PATIENT TYPE:  INP   LOCATION:  4738                         FACILITY:  [facility]   PHYSICIAN:  Learta Codding, M.D. LHCDATE OF BIRTH:  [birth year]   DATE OF ADMISSION:  09/23/2005  DATE OF DISCHARGE:  09/24/2005                                 DISCHARGE SUMMARY   PRIMARY CARE PHYSICIAN:  Alroy Dust, M.D.   PRINCIPAL DIAGNOSIS:  Chest pain.   OTHER DIAGNOSES:  1.  Hypertension.  2.  Non-obstructive coronary artery disease, normal left ventricular      function.  3.  Fibromyalgia.  4.  Anxiety.  5.  History of AV nodal re-entrance tachycardia, status post radio-frequency      ablation at [facility] in 1993.   ALLERGIES:  NO KNOWN DRUG ALLERGIES.   PROCEDURE:  None.   HISTORY OF PRESENT ILLNESS:  This is a 65 year old white female with a prior  history of non-obstructive coronary artery disease who woke up on September 23, 2005 feeling fatigued, short of breath, and tired. She noted her blood  pressure was elevated at 216/123 at approximately 2:00 p.m. and called the  [facility] Cardiology Office and was referred to the emergency department for  further evaluation. On arrival to the emergency department, she also  complained of left face and bilateral scapular and chest discomfort,  occurring 3 times over the past 2 weeks and somewhat relieved by taking,  what sounds like Nexium. The decision was made to admit her for further  evaluation.   HOSPITAL COURSE:  ECG was without acute changes and cardiac markers were  negative x2. She has not had any recurrent chest discomfort and is being  discharged home today in satisfactory condition. She will be set up for an  outpatient Adenosine Myoview as well as renal artery ultrasound, given her  hypertension yesterday. She will have followup with Dr. Riley Kill or one of  the PA's or nurse practitioner at [facility] in  approximately 2  weeks.   LABORATORY DATA:  On discharge, hemoglobin 13.4 and hematocrit 39.0. White  blood cell count 5.0. Platelets 229,000. PT 13.2, INR 1.0, PTT 26. Sodium  136, potassium 3.6, chloride 105, CO2 24, BUN 10, creatinine 0.8, glucose  91, total bilirubin 1.4, alkaline phosphatase 89, AST 17, ALT 16, total  protein 6.6, albumin 4.3, calcium 9.4. Hemoglobin A1C 5.3. Cardiac markers  negative x2. TSH 0.761.   DISPOSITION:  The patient is being discharged home today in good condition.   FOLLOW UP:  1.  She is asked to followup with Dr. Kriste Basque in approximately 3 to 4 weeks.  2.  She will have followup at [facility] Cardiology for renal ultrasound as      well as Adenosine Myoview as mentioned above.  3.  She will see Dr. Riley Kill in approximately 2 weeks.   DISCHARGE MEDICATIONS:  1.  Aspirin 325 mg daily.  2.  Atenolol 25 mg daily.  3.  Xanax 0.5 mg t.i.d. p.r.n.  4.  Ambien 5 mg q.h.s.  5.  Mobic 5 mg b.i.d.  6.  Nitroglycerin 0.4 mg sublingual p.r.n. chest pain.  7.  Tramadol 50 mg p.r.n.  8.  Prilosec 20 mg daily.  9.  MiraLax q.h.s.   OUTSTANDING LABORATORY STUDIES:  None.   DURATION OF DISCHARGE ENCOUNTER:  40 minutes including physician time.      Ok Anis, NP      Learta Codding, M.D. [facility]  Electronically Signed    CRB/MEDQ  D:  09/24/2005  T:  09/24/2005  Job:  [id number]   cc:   Arturo Morton. Riley Kill, M.D. [facility]  [address]  [address]  [place]  [place] [address]   Lonzo Cloud. Kriste Basque, M.D. [facility]  [address]  [place]  [place] [address]

## 2010-10-29 NOTE — Cardiovascular Report (Signed)
NAME:  Renee [name], Renee [name]                         ACCOUNT NO.:  [id number]   MEDICAL RECORD NO.:  [id number]                   PATIENT TYPE:  OIB   LOCATION:  2856                                 FACILITY:  [facility]   PHYSICIAN:  Arturo Morton. Riley Kill, M.D.             DATE OF BIRTH:  11/25/1945   DATE OF PROCEDURE:  07/23/2003  DATE OF DISCHARGE:  07/23/2003                              CARDIAC CATHETERIZATION   INDICATIONS:  Renee [name] is a delightful 65 year old well known to me.  She  had previous catheterization two years ago, at which time there was some  concern over possible disease in the proximal circumflex.  However, it did  not appear to be flow limiting and medical therapy was recommended.  She has  had several negative Cardiolites in the interim.  Recently she had a stress  test with no chest pain during exercise and no exercise induced ST  depression, but chest pain in the most post procedure period.  She has had a  two and a half hour episode of severe substernal chest pain several days  ago, and came to the office where she was seen and evaluated.  She refused  to have catheterization until I returned, but was agreeable to be set up for  diagnostic catheterization.  Risks, benefits and alternatives were discussed  with Renee [name] and she was brought to the catheterization laboratory for  further evaluation.   PROCEDURES:  1. Left heart catheterization.  2. Selective coronary arteriography.  3. Selective left ventriculography.  4. Aortic root aortography.   DESCRIPTION OF PROCEDURE:  The procedure was performed from the right  femoral artery following an anterior puncture.  A 6 French sheath was  placed.  Ventriculography and proximal root aortography were then performed  without complication.  RCA angiography was performed.  We had selected a JL-  3.0 guiding catheter to try to avoid deeper intubation of the left main and  this worked nicely and several shots were taken outside  the ostium and then  just inside the ostium.  The procedure was tolerated well and all catheters  were subsequently removed.  The patient was taken to the holding area in  satisfactory clinical condition.   HEMODYNAMIC DATA:  1. Central aorta:  130/83, mean 105.  2. Left ventricle:  119/13.  3. No aortic left ventricular grading on pullback across the aortic valve.   ANGIOGRAPHIC DATA:  1. Ventriculography was performed in the RAO projection because a     ventricular ectopy and ejection fraction could not be calculated, but     overall systolic function appeared to be well preserved.  There was not     significant mitral regurgitation.  2. The aortic root appeared to be normal in size and the takeoff of the     coronaries appeared to be satisfactory.  The aortic root demonstrated no     aortic regurgitation,  no dilatation and no evidence of aortic dissection.  3. The left main coronary artery was fairly short, but free of critical     disease.  4. The left anterior descending artery courses to the apex.  The LAD divides     distally and there is a proximal diagonal branch.  All of these appear to     be free of critical narrowing.  5. The circumflex at the ostium demonstrates, again, about a 30 to 40% area     of osteal narrowing.  This does not appear to be flow limiting, nor does     there appear to be any evidence of dissection.  However, it does appear     similar to the previous study.  Beyond this there are several smaller     marginal branches, then a major marginal branch, none of which appear to     have high grade disease.  6. The right coronary artery is a dominant vessel, large in caliber, and     provides a posterior descending and posterolateral branch.  All of these     appear to be free of disease.   CONCLUSIONS:  1. Well preserved left ventricular function.  2. No evidence of aortic dissection, aortic regurgitation, or dilatation of     the aortic root.  3. 30 to  40% osteal eccentric plaguing at the origin of the circumflex that     does not appear to be flow limiting.   DISPOSITION:  The exact etiology of her chest pain is uncertain.  She has  not had significant abnormalities by chest x-rays and a D-Dimer has been  normal.  No definite EKG changes have been demonstrated during pain.  The  patient has had repeated negative studies, but importantly the pain that she  has had has been intermittently severe and difficult to categorize.  Conservative management will continue to be recommended.                                               Arturo Morton. Riley Kill, M.D.    TDS/MEDQ  D:  07/23/2003  T:  07/24/2003  Job:  [id number]   cc:   Arturo Morton. Riley Kill, M.D.   Lonzo Cloud. Kriste Basque, M.D. [facility]   CV Laboratory
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
## 2010-11-02 ENCOUNTER — Ambulatory Visit (HOSPITAL_COMMUNITY): Payer: Medicare PPO | Admitting: Psychology

## 2010-11-16 ENCOUNTER — Encounter: Payer: Self-pay | Admitting: Cardiology

## 2010-11-16 ENCOUNTER — Ambulatory Visit (HOSPITAL_COMMUNITY): Payer: Medicare PPO | Admitting: Psychology

## 2010-11-16 DIAGNOSIS — F39 Unspecified mood [affective] disorder: Secondary | ICD-10-CM

## 2010-11-23 ENCOUNTER — Encounter (HOSPITAL_BASED_OUTPATIENT_CLINIC_OR_DEPARTMENT_OTHER): Payer: Medicare PPO | Admitting: Psychology

## 2010-11-23 DIAGNOSIS — F4322 Adjustment disorder with anxiety: Secondary | ICD-10-CM

## 2010-11-30 ENCOUNTER — Encounter (HOSPITAL_COMMUNITY): Payer: Medicare PPO | Admitting: Psychology

## 2010-12-08 ENCOUNTER — Ambulatory Visit (INDEPENDENT_AMBULATORY_CARE_PROVIDER_SITE_OTHER): Payer: Medicare PPO | Admitting: Cardiology

## 2010-12-08 DIAGNOSIS — I498 Other specified cardiac arrhythmias: Secondary | ICD-10-CM

## 2010-12-08 DIAGNOSIS — I10 Essential (primary) hypertension: Secondary | ICD-10-CM

## 2010-12-08 DIAGNOSIS — Q8989 Other specified congenital malformations: Secondary | ICD-10-CM

## 2010-12-08 DIAGNOSIS — R072 Precordial pain: Secondary | ICD-10-CM

## 2010-12-08 DIAGNOSIS — I2510 Atherosclerotic heart disease of native coronary artery without angina pectoris: Secondary | ICD-10-CM

## 2010-12-08 DIAGNOSIS — Q898 Other specified congenital malformations: Secondary | ICD-10-CM

## 2010-12-08 NOTE — Patient Instructions (Signed)
Your physician recommends that you return for FASTING lab work on a day that is good for you: bmp/magnesium/lipid/liver (786.51;401.9).  Your physician recommends that you schedule a follow-up appointment in: 3 weeks.  Your physician recommends that you continue on your current medications as directed. Please refer to the Current Medication list given to you today.

## 2010-12-13 ENCOUNTER — Other Ambulatory Visit (INDEPENDENT_AMBULATORY_CARE_PROVIDER_SITE_OTHER): Payer: Medicare PPO

## 2010-12-13 ENCOUNTER — Other Ambulatory Visit: Payer: Medicare PPO | Admitting: *Deleted

## 2010-12-13 DIAGNOSIS — I10 Essential (primary) hypertension: Secondary | ICD-10-CM

## 2010-12-13 DIAGNOSIS — R072 Precordial pain: Secondary | ICD-10-CM

## 2010-12-13 LAB — HEPATIC FUNCTION PANEL
ALT: 19 U/L (ref 0–35)
AST: 22 U/L (ref 0–37)
Alkaline Phosphatase: 88 U/L (ref 39–117)
Total Bilirubin: 1.2 mg/dL (ref 0.3–1.2)

## 2010-12-13 LAB — BASIC METABOLIC PANEL
BUN: 19 mg/dL (ref 6–23)
GFR: 74.29 mL/min (ref >60.00)
Potassium: 4.3 mEq/L (ref 3.5–5.1)
Sodium: 141 mEq/L (ref 135–145)

## 2010-12-13 LAB — MAGNESIUM: Magnesium: 2.1 mg/dL (ref 1.5–2.5)

## 2010-12-13 LAB — LIPID PANEL: HDL: 66.7 mg/dL (ref >39.00)
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
## 2010-12-16 NOTE — Assessment & Plan Note (Signed)
ALways been difficult to tell with this.  Chest pain has always been out of proportion to disease, and she has been cathed in past.  See most recent results in overview.   I think we will get some labs and see her back in close follow up given subtle ECG abnormalities.

## 2010-12-16 NOTE — Assessment & Plan Note (Signed)
Currently not a problem.  Need to keep an eye on electrolytes.

## 2010-12-16 NOTE — Assessment & Plan Note (Signed)
Prior ablation.  Not currently an issue.

## 2010-12-16 NOTE — Assessment & Plan Note (Signed)
Important to monitor electrolytes.  No family history.  Comes and goes on ECG.

## 2010-12-27 ENCOUNTER — Ambulatory Visit (INDEPENDENT_AMBULATORY_CARE_PROVIDER_SITE_OTHER): Payer: Medicare PPO | Admitting: Cardiology

## 2010-12-27 ENCOUNTER — Encounter: Payer: Self-pay | Admitting: Cardiology

## 2010-12-27 VITALS — BP 121/70 | HR 60 | Resp 18 | Ht 64.0 in | Wt 149.0 lb

## 2010-12-27 DIAGNOSIS — R61 Generalized hyperhidrosis: Secondary | ICD-10-CM

## 2010-12-27 DIAGNOSIS — I498 Other specified cardiac arrhythmias: Secondary | ICD-10-CM

## 2010-12-27 DIAGNOSIS — R072 Precordial pain: Secondary | ICD-10-CM

## 2010-12-27 DIAGNOSIS — Q898 Other specified congenital malformations: Secondary | ICD-10-CM

## 2010-12-27 DIAGNOSIS — R079 Chest pain, unspecified: Secondary | ICD-10-CM

## 2010-12-27 DIAGNOSIS — I10 Essential (primary) hypertension: Secondary | ICD-10-CM

## 2010-12-29 ENCOUNTER — Telehealth: Payer: Self-pay | Admitting: Cardiology

## 2010-12-29 NOTE — Telephone Encounter (Signed)
Pt calling stating she wanted dr Riley Kill to be aware she is taking xanax 0.5mg  approx. 2-3 tabs per day, as she is dealing with total care of mother and this helps her rest--advised i would let dr Riley Kill know--nt

## 2010-12-29 NOTE — Telephone Encounter (Signed)
Pt wanted to let Dr. Riley Kill know she is taking alprazolan 0.5mg  tid and she has been under a lot of stress and she takes it only at bedtime and sometimes in the morning

## 2011-02-01 NOTE — Assessment & Plan Note (Signed)
Not sure of source, ?hormonal, has had for many years.

## 2011-02-01 NOTE — Assessment & Plan Note (Signed)
No real change in pattern.

## 2011-02-01 NOTE — Assessment & Plan Note (Signed)
Never clearly established.  Please see prior EP notes.  QTc is always borderline.  I do suggest that she stays up to date on electrolyte levels given medications.

## 2011-02-01 NOTE — Assessment & Plan Note (Signed)
Controlled on maxzide. 

## 2011-02-01 NOTE — Assessment & Plan Note (Signed)
No known clinical recurrence.
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
## 2011-02-21 ENCOUNTER — Telehealth: Payer: Self-pay | Admitting: Cardiology

## 2011-02-21 NOTE — Telephone Encounter (Signed)
Pt calling regarding something odd going on visibile through pt EKG. Pt was told to get her estrogen levels checked. Pt c/o horrible night sweats.   Pt is also c/o palpitations and/or fluttering.   Pt was recently put on combo patch of estrogen/progesterone  50/250  Pt wanted to inform MD that pt was put on patch and is seeing some relief from it.

## 2011-02-21 NOTE — Telephone Encounter (Signed)
Left a message to call back.

## 2011-02-21 NOTE — Telephone Encounter (Signed)
Patient called because she is concern about something odd showing in the last two EKG's the is confused and she wants to find out what  is happening. Patient states she was put on a patch of Estrogen and Progesterone 50/250 medication by her GYN and has not have estrogen blood  level yet . She states that she has been having palpitations and fluttering recently, she would like for Dr. Riley Kill to know what is going on.

## 2011-02-25 NOTE — Telephone Encounter (Signed)
Pt calling back wanting to see what Dr. Riley Kill advises considering pt past messages. Please return pt call to discuss further.

## 2011-02-25 NOTE — Telephone Encounter (Signed)
[name] states she wants to see Dr. Riley Kill soon because of her bad EKG's she has been having lately . She would like to speak with [name] so she will call back on Monday.

## 2011-03-01 NOTE — Telephone Encounter (Signed)
I spoke with the pt and discussed the results of her EKG from her last two office visits.  The pt said that since starting hormone patch she has developed palpitations.  The pt complains of a rapid fluttering in her chest that will last for about 5 seconds and occurs 1-2 times a week.  The pt's night sweats and hot flashes have improved since starting patch.  At this time the pt is not scheduled for any follow-up labs with Dr Duane Lope to check her hormone levels.  The pt said she would follow-up with Dr Tenny Craw about any need for labs.  I made the pt aware that coming into the office for an EKG would not be the best way to determine what she is feeling.  I discussed having the pt wear a heart monitor to assess for arrhythmia. At this time the pt is not interested in wearing a monitor.  The pt would like to keep track of how frequently she is having palpitations over the next few weeks and then make a decision about whether she would like to wear a monitor.  I instructed the pt to call the office if she develops symptoms with palpitations or if they increase in frequency or length.  Pt agreed with plan.

## 2011-06-22 ENCOUNTER — Ambulatory Visit (INDEPENDENT_AMBULATORY_CARE_PROVIDER_SITE_OTHER): Payer: Medicare PPO | Admitting: Cardiology

## 2011-06-22 ENCOUNTER — Encounter: Payer: Self-pay | Admitting: Cardiology

## 2011-06-22 VITALS — BP 122/70 | HR 74 | Ht 64.0 in | Wt 150.0 lb

## 2011-06-22 DIAGNOSIS — I2510 Atherosclerotic heart disease of native coronary artery without angina pectoris: Secondary | ICD-10-CM

## 2011-06-22 DIAGNOSIS — Q898 Other specified congenital malformations: Secondary | ICD-10-CM

## 2011-06-22 DIAGNOSIS — R079 Chest pain, unspecified: Secondary | ICD-10-CM

## 2011-06-22 DIAGNOSIS — I10 Essential (primary) hypertension: Secondary | ICD-10-CM

## 2011-06-22 NOTE — Patient Instructions (Signed)
Your physician wants you to follow-up in: 6 MONTHS.  You will receive a reminder letter in the mail two months in advance. If you don't receive a letter, please call our office to schedule the follow-up appointment.  Your physician recommends that you continue on your current medications as directed. Please refer to the Current Medication list given to you today.  
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
## 2011-06-27 ENCOUNTER — Telehealth: Payer: Self-pay | Admitting: Pulmonary Disease

## 2011-06-27 MED ORDER — AZITHROMYCIN 250 MG PO TABS: ORAL_TABLET | ORAL | Status: AC

## 2011-06-27 NOTE — Telephone Encounter (Signed)
Per SN---ok for pt to have zpak #1  Take as directed with no refills. lmom to make pt aware of meds sent to her pharmacy.

## 2011-06-27 NOTE — Telephone Encounter (Signed)
I spoke with pt and she c/o nasal congestion, PND, throat burning, chest congestion, cough w/ clear phlem, sneezing x 3 days. Denies any chills, sweats, fever, nausea, vomiting. Pt states she can;t take anything OTC bc of her long QT syndrome. Pt is requesting an abx be called in for her. Please advise Dr. Kriste Basque, thanks  Allergies  Allergen Reactions  . Moxifloxacin     REACTION: pt states she can't take Avelox...     [facility] [place]

## 2011-07-01 NOTE — Assessment & Plan Note (Signed)
Has had cath in the past.  Her last study did not suggest high grade focal disease.  She does have some plaque.  However, it was not significant.  Will continue to monitor.

## 2011-07-01 NOTE — Assessment & Plan Note (Signed)
Controlled on minidiuretic therapy.

## 2011-07-01 NOTE — Assessment & Plan Note (Signed)
Please see prior EP notes.  She knows to monitor medication prescriptions. The ECG is essentially unchanged.

## 2011-07-06 ENCOUNTER — Telehealth: Payer: Self-pay | Admitting: Pulmonary Disease

## 2011-07-06 MED ORDER — AZITHROMYCIN 250 MG PO TABS: ORAL_TABLET | ORAL | Status: AC

## 2011-07-06 NOTE — Telephone Encounter (Signed)
Per SN---ok for pt to have zpak #1  Take as direced with no refills. thanks

## 2011-07-06 NOTE — Telephone Encounter (Signed)
Lm on named [facility] rx was sent and to call back if she had any questions. Will sign off message

## 2011-07-06 NOTE — Telephone Encounter (Signed)
I spoke with the pt and she states she finished zpak that was given by dr. Kriste Basque on Saturday, 07-02-11. She states she does feel some better, her cough is gone and sore throat is improved but she still has a lot of sinus congestion and is blowing dark yellow mucus from her nose. I advised the pt that since it has been 1 year since last OV and she has already been given 1 abx she should come in for an appt. The pt states she has already set her 1 yr physical for 07-20-11 and does not want to come in before then. She feels that she just needs another round of zpak to completely get rid of symptoms. Please advise. [name], CMA Allergies  Allergen Reactions  . Moxifloxacin     REACTION: pt states she can't take Avelox.Marland KitchenMarland Kitchen

## 2011-07-06 NOTE — Telephone Encounter (Signed)
[id number] to offer pt an appt with TP today since she is no better. Carron Curie, CMA

## 2011-07-19 ENCOUNTER — Telehealth: Payer: Self-pay | Admitting: Pulmonary Disease

## 2011-07-19 MED ORDER — LEVOFLOXACIN 750 MG PO TABS: 750.0000 mg | ORAL_TABLET | Freq: Every day | ORAL | Status: DC

## 2011-07-19 NOTE — Telephone Encounter (Signed)
rx sent to [facility] in [place].  Left message on named voice mail (I did not notice where Leigh had left message as well) informing pt of the change in medication and that it was been sent to [facility].  Encouraged pt to call with any questions/concerns.  Will sign off.

## 2011-07-19 NOTE — Telephone Encounter (Signed)
Per SN--ok for pt to have levaquin 750mg   #7  1 daily until gone.  lmomtcb for pt to make her aware.

## 2011-07-19 NOTE — Telephone Encounter (Signed)
Pt unable to take avelox, please advise.  Thanks!

## 2011-07-19 NOTE — Telephone Encounter (Signed)
Per SN---ok for pt to have avelox  400mg   #7  1 daily until gone.  thanks

## 2011-07-19 NOTE — Telephone Encounter (Signed)
I spoke with [name] and she states she still hhas the crud. [place] [name] c/o cough w/ yellow phlem, chest congestion, PND, and nasal congestion. She states overall she is feeling some better. [name] believes she may need another round of abx to help clear her up. [name] states she can't take mucinex. I offered [name] an apt to see TP but refused stating her mother is int he hospital. Please advise Dr. Kriste Basque, thanks  Allergies  Allergen Reactions  . Moxifloxacin     REACTION: [name] states she can't take Avelox...     [facility]

## 2011-07-28 ENCOUNTER — Ambulatory Visit (INDEPENDENT_AMBULATORY_CARE_PROVIDER_SITE_OTHER): Payer: Medicare Other | Admitting: Pulmonary Disease

## 2011-07-28 ENCOUNTER — Encounter: Payer: Self-pay | Admitting: Pulmonary Disease

## 2011-07-28 DIAGNOSIS — F411 Generalized anxiety disorder: Secondary | ICD-10-CM

## 2011-07-28 DIAGNOSIS — I498 Other specified cardiac arrhythmias: Secondary | ICD-10-CM

## 2011-07-28 DIAGNOSIS — IMO0001 Reserved for inherently not codable concepts without codable children: Secondary | ICD-10-CM

## 2011-07-28 DIAGNOSIS — E041 Nontoxic single thyroid nodule: Secondary | ICD-10-CM

## 2011-07-28 DIAGNOSIS — K589 Irritable bowel syndrome without diarrhea: Secondary | ICD-10-CM

## 2011-07-28 DIAGNOSIS — G894 Chronic pain syndrome: Secondary | ICD-10-CM

## 2011-07-28 DIAGNOSIS — I10 Essential (primary) hypertension: Secondary | ICD-10-CM

## 2011-07-28 DIAGNOSIS — J42 Unspecified chronic bronchitis: Secondary | ICD-10-CM

## 2011-07-28 DIAGNOSIS — M545 Low back pain, unspecified: Secondary | ICD-10-CM

## 2011-07-28 DIAGNOSIS — K219 Gastro-esophageal reflux disease without esophagitis: Secondary | ICD-10-CM

## 2011-07-28 DIAGNOSIS — K5909 Other constipation: Secondary | ICD-10-CM

## 2011-07-28 DIAGNOSIS — M1990 Unspecified osteoarthritis, unspecified site: Secondary | ICD-10-CM

## 2011-07-28 DIAGNOSIS — K5730 Diverticulosis of large intestine without perforation or abscess without bleeding: Secondary | ICD-10-CM

## 2011-07-28 DIAGNOSIS — I2510 Atherosclerotic heart disease of native coronary artery without angina pectoris: Secondary | ICD-10-CM

## 2011-07-28 MED ORDER — NITROGLYCERIN 0.4 MG SL SUBL: 0.4000 mg | SUBLINGUAL_TABLET | SUBLINGUAL | Status: DC | PRN

## 2011-07-28 MED ORDER — TRIAMTERENE-HCTZ 37.5-25 MG PO TABS: 0.5000 | ORAL_TABLET | Freq: Every day | ORAL | Status: DC

## 2011-07-28 MED ORDER — DICYCLOMINE HCL 20 MG PO TABS: 20.0000 mg | ORAL_TABLET | Freq: Four times a day (QID) | ORAL | Status: DC

## 2011-07-28 MED ORDER — ALPRAZOLAM 0.5 MG PO TABS: ORAL_TABLET | ORAL | Status: DC

## 2011-07-28 NOTE — Patient Instructions (Signed)
Today we updated your med list in our EPIC system...    Continue your current medications the same...    We refilled the meds your requested...  Try to get a brand of GUAIFENESIN that you can tolerate and endeavor to slowly increase to 1200mg  twice daily w/ plenty of water/ fluids for the thick phlegm & congestion...  Call for any questions or if we can be of service in any way.Marland KitchenMarland Kitchen
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
## 2011-08-30 ENCOUNTER — Telehealth: Payer: Self-pay | Admitting: Cardiology

## 2011-08-30 NOTE — Telephone Encounter (Signed)
Pt was given a z-pack by Dr. Jorja Loa and she has angina pains and she had to take some nitro and she is under stress with her mom being sick and she has had some palpitations and she wants to know what to do

## 2011-08-30 NOTE — Telephone Encounter (Signed)
N/A.  LMTC. 

## 2011-08-30 NOTE — Telephone Encounter (Signed)
Patient called concerned she took 2  z-packs recently.Stated she has had chest pain,and palpitations.States she is taking care of her mother, and is under a lot of stress.States she has appointment with Tereso Newcomer PA this Thursday 09/01/11.Advised to keep appointment and go to er if needed.

## 2011-08-30 NOTE — Telephone Encounter (Signed)
Patient returning nurse call, she can be reached at [phone]

## 2011-09-01 ENCOUNTER — Encounter: Payer: Self-pay | Admitting: Physician Assistant

## 2011-09-01 ENCOUNTER — Ambulatory Visit (HOSPITAL_COMMUNITY)
Admission: RE | Admit: 2011-09-01 | Discharge: 2011-09-01 | Disposition: A | Discharge status: Home / Self Care | Payer: Medicare Other | Source: Ambulatory Visit | Attending: Physician Assistant | Admitting: Physician Assistant

## 2011-09-01 ENCOUNTER — Ambulatory Visit (INDEPENDENT_AMBULATORY_CARE_PROVIDER_SITE_OTHER): Payer: Medicare Other | Admitting: Physician Assistant

## 2011-09-01 VITALS — BP 120/82 | HR 75 | Ht 64.0 in | Wt 149.0 lb

## 2011-09-01 DIAGNOSIS — Z87898 Personal history of other specified conditions: Secondary | ICD-10-CM

## 2011-09-01 DIAGNOSIS — Z8679 Personal history of other diseases of the circulatory system: Secondary | ICD-10-CM

## 2011-09-01 DIAGNOSIS — R002 Palpitations: Secondary | ICD-10-CM

## 2011-09-01 DIAGNOSIS — R079 Chest pain, unspecified: Secondary | ICD-10-CM

## 2011-09-01 DIAGNOSIS — R0609 Other forms of dyspnea: Secondary | ICD-10-CM

## 2011-09-01 DIAGNOSIS — I498 Other specified cardiac arrhythmias: Secondary | ICD-10-CM

## 2011-09-01 DIAGNOSIS — R0602 Shortness of breath: Secondary | ICD-10-CM

## 2011-09-01 DIAGNOSIS — I2510 Atherosclerotic heart disease of native coronary artery without angina pectoris: Secondary | ICD-10-CM

## 2011-09-01 DIAGNOSIS — R5381 Other malaise: Secondary | ICD-10-CM | POA: Insufficient documentation

## 2011-09-01 NOTE — Progress Notes (Signed)
[address] [place], [place]  [address] Phone: [phone] Fax:  [phone]  Date:  09/01/2011   Name:  Renee [name]       DOB:  [birth year] MRN:  [id number]  PCP:  Dr. Kriste Basque  Primary Cardiologist:  Dr.  Shawnie Pons  Primary Electrophysiologist:  None    History of Present Illness: Renee [name] is a 66 y.o. female who presents for chest pain and palps.    She has a history of AV nodal reentrant tachycardia, status post RFCA in 1993 at [facility], prolonged QT in the past, hypertension, GERD, IBS, anxiety.  LHC 07/2003: Normal LV function ostial circumflex 30-40%.  Last nuclear study 4/07: Low risk, no ischemia. Last echocardiogram 11/2009: EF 55-60%, grade 2 diastolic dysfunction, PASP 34.  She has a h/o chronic chest pain.  She notes her symptoms had been quiescent over the last 2 years.  Over the last 2-3 weeks they have returned.  She notes fairly significant left chest and left neck pressure.  This seems to come on at any time.  No exertional pain.  She has chronic DOE.  Describes class 2-2b symptoms.  No changes.  No orthopnea, PND, edema.  No syncope.  She has noted rapid palpitations over the last 2-3 weeks that have been very brief.  They are somewhat similar to her prior symptoms with SVT.  She notes chest pain that coincides with her palpitations.    Past Medical History  Diagnosis Date  . Bronchitis     Recurrent  . Hypertension   . Chest pain   . Coronary artery disease   . AV nodal re-entry tachycardia   . Long Q-T syndrome   . GERD (gastroesophageal reflux disease)   . Dysphagia   . Duodenitis without hemorrhage   . Diverticulosis of colon   . Irritable bowel syndrome   . Constipation, chronic   . Hemorrhoids   . Fecal incontinence   . Thyroid nodule   . Degenerative joint disease   . Lumbar back pain   . Fibromyalgia   . Chronic pain syndrome   . Neuropathy   . Anxiety     Current Outpatient Prescriptions  Medication Sig  Dispense Refill  . ALPRAZolam (XANAX) 0.5 MG tablet Take 1/2 tablet to 1 tablet by mouth three times a day as needed  90 tablet  5  . aspirin 81 MG tablet Take 81 mg by mouth daily.        Marland Kitchen BEE POLLEN PO Take 550 mg by mouth daily.       . Cholecalciferol (VITAMIN D) 1000 UNITS capsule Take 2,000 Units by mouth daily.       Marland Kitchen dicyclomine (BENTYL) 20 MG tablet Take 1 tablet (20 mg total) by mouth every 6 (six) hours.  90 tablet  5  . Naproxen Sodium (ALEVE PO) Take by mouth as needed.        . nitroGLYCERIN (NITROSTAT) 0.4 MG SL tablet Place 1 tablet (0.4 mg total) under the tongue as needed.  25 tablet  6  . Nutritional Supplements (NUTRITIONAL SHAKE PO) Take by mouth daily.       Marland Kitchen triamterene-hydrochlorothiazide (MAXZIDE-25) 37.5-25 MG per tablet Take 0.5 each (0.5 tablets total) by mouth daily.  30 tablet  11  . azithromycin (ZITHROMAX) 250 MG tablet Take 250 mg by mouth daily. ( COMPLETED )      . levofloxacin (LEVAQUIN) 750 MG tablet Take 750 mg by mouth daily. (  COMPLETED )      . solifenacin (VESICARE) 5 MG tablet Take 10 mg by mouth daily. ( ON HOLD TILL APPROVED BY CARDIOLOGY )        Allergies: Allergies  Allergen Reactions  . Moxifloxacin     REACTION: pt states she can't take Avelox...    History  Substance Use Topics  . Smoking status: Never Smoker   . Smokeless tobacco: Not on file  . Alcohol Use: Yes     Occasional      ROS:  Please see the history of present illness.   She had recent URI symptoms and took a zpak and then levaquin.  She was concerned about prolonged QT with these medications.  URI symptoms have resolved.  No melena, hematochezia.  No chest pain related to meals.  No dysphagia.   All other systems reviewed and negative.   PHYSICAL EXAM: VS:  BP 120/82  Pulse 75  Ht 5\' 4"  (1.626 m)  Wt 149 lb (67.586 kg)  BMI 25.58 kg/m2 Well nourished, well developed, in no acute distress HEENT: normal Neck: no JVD Vascular: no carotid bruits Endo: no  thyromegaly Cardiac:  normal S1, S2; RRR; no murmur Lungs:  clear to auscultation bilaterally, no wheezing, rhonchi or rales Abd: soft, nontender, no hepatomegaly Ext: no edema Skin: warm and dry Neuro:  CNs 2-12 intact, no focal abnormalities noted  EKG:  Sinus rhythm, heart rate 75, normal axis, nonspecific ST-T wave changes, no change from prior tracing  ASSESSMENT AND PLAN:  1. Shortness of breath  Stable.  With recent URI symptoms and chest pain, will check CXR.   2. Chest pain   Somewhat atypical.  But, symptoms are dramatic when they occur and relieved with NTG.  Prior cath with no significant CAD and prior nuclear studies negative.  Will arrange ETT-Myoview.  Follow up with Dr.  Shawnie Pons in 4-6 weeks.  She knows to go to the ED if she feels worse.   3. Coronary Artery Disease As noted, minimal CAD by prior cath.  Proceed with stress testing as noted.   4. Palpitations  Proceed with event monitor, CBC, BMET, TSH.  Follow up with Dr.  Shawnie Pons in 4-6 weeks.   5. AV NODAL REENTRY TACHYCARDIA  S/p prior ablation.  Check monitor to rule out recurrence.   6. H/O prolonged Q-T interval on ECG  ECG stable today.  She knows to avoid QT prolonging drugs.      Luna Glasgow, PA-C  3:39 PM 09/01/2011

## 2011-09-01 NOTE — Patient Instructions (Signed)
Your physician recommends that you have lab work today:   BMP, CBC, TSH  Your physician has requested that you have en exercise stress myoview. For further information please visit [email or web address] Please follow instruction sheet, as given.  Your physician has recommended that you wear an event monitor. Event monitors are medical devices that record the heart's electrical activity. Doctors most often [place] these monitors to diagnose arrhythmias. Arrhythmias are problems with the speed or rhythm of the heartbeat. The monitor is a small, portable device. You can wear one while you do your normal daily activities. This is usually used to diagnose what is causing palpitations/syncope (passing out).  A chest x-ray takes a picture of the organs and structures inside the chest, including the heart, lungs, and blood vessels. This test can show several things, including, whether the heart is enlarges; whether fluid is building up in the lungs; and whether pacemaker / defibrillator leads are still in place.   Your physician recommends that you schedule a follow-up appointment in: 4-6 weeks with Dr. Riley Kill

## 2011-09-02 ENCOUNTER — Telehealth: Payer: Self-pay | Admitting: *Deleted

## 2011-09-02 LAB — BASIC METABOLIC PANEL
BUN: 16 mg/dL (ref 6–23)
CO2: 29 mEq/L (ref 19–32)
Calcium: 10.2 mg/dL (ref 8.4–10.5)
Creatinine, Ser: 0.7 mg/dL (ref 0.4–1.2)
GFR: 83.44 mL/min (ref >60.00)
Glucose, Bld: 69 mg/dL — ABNORMAL LOW (ref 70–99)

## 2011-09-02 LAB — CBC WITH DIFFERENTIAL/PLATELET
Eosinophils Relative: 1.2 % (ref 0.0–5.0)
HCT: 42 % (ref 36.0–46.0)
Hemoglobin: 14 g/dL (ref 12.0–15.0)
Lymphs Abs: 2.5 10*3/uL (ref 0.7–4.0)
MCV: 84.4 fl (ref 78.0–100.0)
Monocytes Absolute: 0.3 10*3/uL (ref 0.1–1.0)
Monocytes Relative: 5 % (ref 3.0–12.0)
Neutro Abs: 3.3 10*3/uL (ref 1.4–7.7)
Platelets: 203 10*3/uL (ref 150.0–400.0)
WBC: 6.2 10*3/uL (ref 4.5–10.5)

## 2011-09-02 NOTE — Telephone Encounter (Signed)
lmom labs ok and cxr no pneumonia. Renee [name]

## 2011-09-02 NOTE — Telephone Encounter (Signed)
Message copied by Tarri Fuller on Fri Sep 02, 2011  3:37 PM ------      Message from: [place], [place] T      Created: Fri Sep 02, 2011  2:53 PM       Please notify patient that the lab results are ok.      Tereso Newcomer, PA-C  2:53 PM 09/02/2011

## 2011-09-20 ENCOUNTER — Ambulatory Visit (HOSPITAL_COMMUNITY): Payer: Medicare Other | Attending: Cardiovascular Disease | Admitting: Radiology

## 2011-09-20 ENCOUNTER — Encounter (INDEPENDENT_AMBULATORY_CARE_PROVIDER_SITE_OTHER): Payer: Medicare Other

## 2011-09-20 VITALS — BP 130/83 | Ht 64.0 in | Wt 147.0 lb

## 2011-09-20 DIAGNOSIS — Z8249 Family history of ischemic heart disease and other diseases of the circulatory system: Secondary | ICD-10-CM | POA: Insufficient documentation

## 2011-09-20 DIAGNOSIS — R079 Chest pain, unspecified: Secondary | ICD-10-CM

## 2011-09-20 DIAGNOSIS — R0609 Other forms of dyspnea: Secondary | ICD-10-CM | POA: Insufficient documentation

## 2011-09-20 DIAGNOSIS — R0989 Other specified symptoms and signs involving the circulatory and respiratory systems: Secondary | ICD-10-CM | POA: Insufficient documentation

## 2011-09-20 DIAGNOSIS — I10 Essential (primary) hypertension: Secondary | ICD-10-CM | POA: Insufficient documentation

## 2011-09-20 DIAGNOSIS — R000 Tachycardia, unspecified: Secondary | ICD-10-CM | POA: Insufficient documentation

## 2011-09-20 DIAGNOSIS — R5381 Other malaise: Secondary | ICD-10-CM | POA: Insufficient documentation

## 2011-09-20 DIAGNOSIS — R0789 Other chest pain: Secondary | ICD-10-CM | POA: Insufficient documentation

## 2011-09-20 DIAGNOSIS — I2510 Atherosclerotic heart disease of native coronary artery without angina pectoris: Secondary | ICD-10-CM

## 2011-09-20 DIAGNOSIS — E119 Type 2 diabetes mellitus without complications: Secondary | ICD-10-CM | POA: Insufficient documentation

## 2011-09-20 DIAGNOSIS — R0602 Shortness of breath: Secondary | ICD-10-CM

## 2011-09-20 DIAGNOSIS — R002 Palpitations: Secondary | ICD-10-CM | POA: Insufficient documentation

## 2011-09-20 DIAGNOSIS — R42 Dizziness and giddiness: Secondary | ICD-10-CM | POA: Insufficient documentation

## 2011-09-20 MED ORDER — TECHNETIUM TC 99M TETROFOSMIN IV KIT
11.0000 | PACK | Freq: Once | INTRAVENOUS | Status: AC | PRN
  Administered 2011-09-20: 11 via INTRAVENOUS

## 2011-09-20 MED ORDER — TECHNETIUM TC 99M TETROFOSMIN IV KIT
32.6000 | PACK | Freq: Once | INTRAVENOUS | Status: AC | PRN
  Administered 2011-09-20: 33 via INTRAVENOUS

## 2011-09-20 NOTE — Progress Notes (Signed)
[facility] SITE 3 NUCLEAR MED [address] [place] [address] [phone]  Cardiology Nuclear Med Study  ZYASIA HALBLEIB is a 66 y.o. female     MRN : [id number]     DOB: 27-Jan-1946  Procedure Date: 09/20/2011  Nuclear Med Background Indication for Stress Test:  Evaluation for Ischemia History:  '93 Ablation for AV Nodal Reentrant Tachycardia,'05 Catherization with n/o CAD,'07 MPS: EF=68%,no ischemia,anterior thinning and '11 Echo:EF=55-60% Cardiac Risk Factors: Family History - CAD, Hypertension and NIDDM  Symptoms:  Chest Pain/Pressure and radiates to neck (last date of chest discomfort Saturday), DOE, Fatigue, Light-Headedness, Palpitations, Rapid HR and SOB   Nuclear Pre-Procedure Caffeine/Decaff Intake:  None NPO After: 11:00pm   Lungs:  clear IV 0.9% NS with Angio Cath:  20g  IV Site: R Forearm  IV Started by:  Stanton Kidney, EMT-P  Chest Size (in):  38 Cup Size: C  Height: 5\' 4"  (1.626 m)  Weight:  147 lb (66.679 kg)  BMI:  Body mass index is 25.23 kg/(m^2). Tech Comments:  NA    Nuclear Med Study 1 or 2 day study: 1 day  Stress Test Type:  Stress  Reading MD: Charlton Haws, MD  Order Authorizing Provider:  Blenda Nicely and Brynda Rim  Resting Radionuclide: Technetium 75m Tetrofosmin  Resting Radionuclide Dose: 11.0 mCi   Stress Radionuclide:  Technetium 31m Tetrofosmin  Stress Radionuclide Dose: 32.6 mCi           Stress Protocol Rest HR: 64 Stress HR: 148  Rest BP: 125/85 Stress BP: 166/81  Exercise Time (min): 6:30 METS: 7.70   Predicted Max HR: 154 bpm % Max HR: 96.1 bpm Rate Pressure Product: 46962   Dose of Adenosine (mg):  n/a Dose of Lexiscan: n/a mg  Dose of Atropine (mg): n/a Dose of Dobutamine: n/a mcg/kg/min (at max HR)  Stress Test Technologist: Cathlyn Parsons, RN  Nuclear Technologist:  Domenic Polite, CNMT     Rest Procedure:  Myocardial perfusion imaging was performed at rest 45 minutes following the  intravenous administration of Technetium 15m Tetrofosmin. Rest ECG: NSR -T wave abnormality  Stress Procedure:  The patient performed treadmill exercise using a Bruce  Protocol for 6:30 minutes. The patient stopped due to fatigue,SOB and target heart rate achieved and denied any chest pain.  There were nonspecific ST-T wave changes. Patient had chest pressure 5-6/10 in recovery. Technetium 82m Tetrofosmin was injected at peak exercise and myocardial perfusion imaging was performed after a brief delay. Stress ECG: Lots of artifact.  Lateral T wave changes in stage one and recovery  QPS Raw Data Images:  Normal; no motion artifact; normal heart/lung ratio. Stress Images:  Normal homogeneous uptake in all areas of the myocardium. Rest Images:  Normal homogeneous uptake in all areas of the myocardium. Subtraction (SDS):  Normal Transient Ischemic Dilatation (Normal <1.22):  1.04 Lung/Heart Ratio (Normal <0.45):  0.29  Quantitative Gated Spect Images QGS EDV:  74 ml QGS ESV:  21 ml  Impression Exercise Capacity:  Fair exercise capacity. BP Response:  Normal blood pressure response. Clinical Symptoms:  SOB and mild SSCP ECG Impression:  Lots of artifact and lateral T wave changes in stage one and recovery Comparison with Prior Nuclear Study: No images to compare  Overall Impression:  Low risk stress nuclear study. Normal nuclear images.  Poor quality ECG.  Lateral T wave changes in this setting  LV Ejection Fraction: 71%.  LV Wall Motion:  NL LV Function; NL Wall  Motion     [employer]

## 2011-09-21 ENCOUNTER — Telehealth: Payer: Self-pay | Admitting: *Deleted

## 2011-09-21 NOTE — Telephone Encounter (Signed)
pt notified of stress test results today and gave verbal understanding. Renee [name]

## 2011-09-21 NOTE — Telephone Encounter (Signed)
Message copied by Tarri Fuller on Wed Sep 21, 2011  3:22 PM ------      Message from: [place], [place] T      Created: Wed Sep 21, 2011  1:52 PM       Please inform patient stress test normal.      Tereso Newcomer, PA-C  1:52 PM 09/21/2011

## 2011-09-22 ENCOUNTER — Other Ambulatory Visit: Payer: Self-pay | Admitting: Pulmonary Disease

## 2011-09-22 DIAGNOSIS — Z1231 Encounter for screening mammogram for malignant neoplasm of breast: Secondary | ICD-10-CM

## 2011-09-30 ENCOUNTER — Telehealth: Payer: Self-pay | Admitting: Cardiology

## 2011-09-30 ENCOUNTER — Other Ambulatory Visit: Payer: Self-pay

## 2011-09-30 DIAGNOSIS — I472 Ventricular tachycardia: Secondary | ICD-10-CM

## 2011-09-30 NOTE — Telephone Encounter (Signed)
We called patient twice and left a message.  I reviewed strips with Dr. Ladona Ridgel.  Could be RVOT VT.  Recheck labs, try to get an ECG during palpitations, and check 2D.  Most recent shows normal LV function.  EF normal by nuc study without ischemia.  Will likely need to restart Beta blockers.  She is scheduled to see me on Tuesday.  Will also arrange EP visit at that time.

## 2011-09-30 NOTE — Telephone Encounter (Signed)
Fu call Patient returning your call . She is at [phone] her cell phone

## 2011-09-30 NOTE — Telephone Encounter (Signed)
Dr Riley Kill called and left a message on the pt's voicemail.  The pt will come into the office on Monday for an EKG, BMP, Magnesium and Echocardiogram.  The pt is scheduled to see Dr Riley Kill on Tuesday.

## 2011-10-03 ENCOUNTER — Other Ambulatory Visit: Payer: Self-pay

## 2011-10-03 ENCOUNTER — Ambulatory Visit (HOSPITAL_BASED_OUTPATIENT_CLINIC_OR_DEPARTMENT_OTHER): Payer: Medicare Other

## 2011-10-03 ENCOUNTER — Other Ambulatory Visit (INDEPENDENT_AMBULATORY_CARE_PROVIDER_SITE_OTHER): Payer: Medicare Other

## 2011-10-03 DIAGNOSIS — I472 Ventricular tachycardia, unspecified: Secondary | ICD-10-CM

## 2011-10-04 ENCOUNTER — Ambulatory Visit (INDEPENDENT_AMBULATORY_CARE_PROVIDER_SITE_OTHER): Payer: Medicare Other | Admitting: Cardiology

## 2011-10-04 ENCOUNTER — Encounter: Payer: Self-pay | Admitting: Cardiology

## 2011-10-04 ENCOUNTER — Observation Stay (HOSPITAL_COMMUNITY)
Admission: AD | Admit: 2011-10-04 | Discharge: 2011-10-07 | Disposition: A | Discharge status: Home / Self Care | Payer: Medicare Other | Source: Ambulatory Visit | Attending: Cardiology | Admitting: Cardiology

## 2011-10-04 ENCOUNTER — Encounter (HOSPITAL_COMMUNITY): Payer: Self-pay | Admitting: Cardiology

## 2011-10-04 VITALS — BP 130/74 | HR 65 | Ht 64.0 in | Wt 151.8 lb

## 2011-10-04 DIAGNOSIS — I10 Essential (primary) hypertension: Secondary | ICD-10-CM | POA: Insufficient documentation

## 2011-10-04 DIAGNOSIS — I2510 Atherosclerotic heart disease of native coronary artery without angina pectoris: Secondary | ICD-10-CM | POA: Insufficient documentation

## 2011-10-04 DIAGNOSIS — R55 Syncope and collapse: Secondary | ICD-10-CM

## 2011-10-04 DIAGNOSIS — G8929 Other chronic pain: Secondary | ICD-10-CM | POA: Insufficient documentation

## 2011-10-04 DIAGNOSIS — R079 Chest pain, unspecified: Secondary | ICD-10-CM | POA: Insufficient documentation

## 2011-10-04 DIAGNOSIS — R0989 Other specified symptoms and signs involving the circulatory and respiratory systems: Secondary | ICD-10-CM

## 2011-10-04 DIAGNOSIS — I472 Ventricular tachycardia, unspecified: Secondary | ICD-10-CM | POA: Insufficient documentation

## 2011-10-04 DIAGNOSIS — Q898 Other specified congenital malformations: Secondary | ICD-10-CM

## 2011-10-04 DIAGNOSIS — R0602 Shortness of breath: Secondary | ICD-10-CM | POA: Insufficient documentation

## 2011-10-04 DIAGNOSIS — I4729 Other ventricular tachycardia: Secondary | ICD-10-CM | POA: Diagnosis present

## 2011-10-04 DIAGNOSIS — K219 Gastro-esophageal reflux disease without esophagitis: Secondary | ICD-10-CM | POA: Insufficient documentation

## 2011-10-04 DIAGNOSIS — I498 Other specified cardiac arrhythmias: Secondary | ICD-10-CM

## 2011-10-04 HISTORY — DX: Other complications of anesthesia, initial encounter: T88.59XA

## 2011-10-04 HISTORY — DX: Adverse effect of unspecified anesthetic, initial encounter: T41.45XA

## 2011-10-04 HISTORY — DX: Malignant (primary) neoplasm, unspecified: C80.1

## 2011-10-04 HISTORY — DX: Acute upper respiratory infection, unspecified: J06.9

## 2011-10-04 LAB — COMPREHENSIVE METABOLIC PANEL
AST: 21 U/L (ref 0–37)
Albumin: 4.4 g/dL (ref 3.5–5.2)
CO2: 28 mEq/L (ref 19–32)
Chloride: 102 mEq/L (ref 96–112)
Creatinine, Ser: 0.72 mg/dL (ref 0.50–1.10)
Glucose, Bld: 87 mg/dL (ref 70–99)
Sodium: 140 mEq/L (ref 135–145)
Total Protein: 7.2 g/dL (ref 6.0–8.3)

## 2011-10-04 LAB — BASIC METABOLIC PANEL
BUN: 9 mg/dL (ref 6–23)
GFR: 106.26 mL/min (ref >60.00)
Glucose, Bld: 77 mg/dL (ref 70–99)
Potassium: 3.8 mEq/L (ref 3.5–5.1)

## 2011-10-04 LAB — CARDIAC PANEL(CRET KIN+CKTOT+MB+TROPI)
CK, MB: 3.1 ng/mL (ref 0.3–4.0)
Total CK: 86 U/L (ref 7–177)
Troponin I: <0.3 ng/mL (ref <0.30)

## 2011-10-04 LAB — CBC
Hemoglobin: 13.4 g/dL (ref 12.0–15.0)
MCH: 27.9 pg (ref 26.0–34.0)
RBC: 4.81 MIL/uL (ref 3.87–5.11)
WBC: 6.9 10*3/uL (ref 4.0–10.5)

## 2011-10-04 LAB — DIFFERENTIAL
Lymphs Abs: 3.3 10*3/uL (ref 0.7–4.0)
Monocytes Relative: 4 % (ref 3–12)
Neutro Abs: 3.2 10*3/uL (ref 1.7–7.7)
Neutrophils Relative %: 46 % (ref 43–77)

## 2011-10-04 LAB — PRO B NATRIURETIC PEPTIDE: Pro B Natriuretic peptide (BNP): 77.9 pg/mL (ref 0–125)

## 2011-10-04 MED ORDER — ASPIRIN 81 MG PO TABS: 81.0000 mg | ORAL_TABLET | Freq: Every day | ORAL | Status: DC

## 2011-10-04 MED ORDER — ASPIRIN EC 81 MG PO TBEC
81.0000 mg | DELAYED_RELEASE_TABLET | Freq: Every day | ORAL | Status: DC
  Administered 2011-10-05 – 2011-10-07 (×3): 81 mg via ORAL
  Filled 2011-10-04 (×4): qty 1

## 2011-10-04 MED ORDER — SODIUM CHLORIDE 0.9 % IJ SOLN: 3.0000 mL | Freq: Two times a day (BID) | INTRAMUSCULAR | Status: DC

## 2011-10-04 MED ORDER — SODIUM CHLORIDE 0.9 % IJ SOLN
3.0000 mL | Freq: Two times a day (BID) | INTRAMUSCULAR | Status: DC
  Administered 2011-10-04 – 2011-10-07 (×6): 3 mL via INTRAVENOUS

## 2011-10-04 MED ORDER — ENOXAPARIN SODIUM 40 MG/0.4ML ~~LOC~~ SOLN
40.0000 mg | SUBCUTANEOUS | Status: DC
  Administered 2011-10-04 – 2011-10-06 (×3): 40 mg via SUBCUTANEOUS
  Filled 2011-10-04 (×4): qty 0.4

## 2011-10-04 MED ORDER — ACETAMINOPHEN 325 MG PO TABS: 650.0000 mg | ORAL_TABLET | Freq: Four times a day (QID) | ORAL | Status: DC | PRN

## 2011-10-04 MED ORDER — DICYCLOMINE HCL 20 MG PO TABS
20.0000 mg | ORAL_TABLET | Freq: Four times a day (QID) | ORAL | Status: DC
  Administered 2011-10-05 – 2011-10-07 (×4): 20 mg via ORAL
  Filled 2011-10-04 (×15): qty 1

## 2011-10-04 MED ORDER — SODIUM CHLORIDE 0.9 % IJ SOLN: 3.0000 mL | INTRAMUSCULAR | Status: DC | PRN

## 2011-10-04 MED ORDER — SODIUM CHLORIDE 0.9 % IV SOLN: 250.0000 mL | INTRAVENOUS | Status: DC | PRN

## 2011-10-04 MED ORDER — NITROGLYCERIN 0.4 MG SL SUBL: 0.4000 mg | SUBLINGUAL_TABLET | SUBLINGUAL | Status: DC | PRN

## 2011-10-04 MED ORDER — ALPRAZOLAM 0.5 MG PO TABS: 0.5000 mg | ORAL_TABLET | Freq: Two times a day (BID) | ORAL | Status: DC | PRN

## 2011-10-04 MED ORDER — ACETAMINOPHEN 650 MG RE SUPP: 650.0000 mg | Freq: Four times a day (QID) | RECTAL | Status: DC | PRN

## 2011-10-04 MED ORDER — TRIAMTERENE-HCTZ 37.5-25 MG PO TABS
0.5000 | ORAL_TABLET | Freq: Every day | ORAL | Status: DC
  Administered 2011-10-05 – 2011-10-07 (×3): 0.5 via ORAL
  Filled 2011-10-04 (×3): qty 0.5
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
## 2011-10-04 NOTE — Assessment & Plan Note (Signed)
See note

## 2011-10-05 DIAGNOSIS — I472 Ventricular tachycardia, unspecified: Secondary | ICD-10-CM

## 2011-10-05 LAB — TSH: TSH: 1.445 u[IU]/mL (ref 0.350–4.500)

## 2011-10-05 MED ORDER — FLECAINIDE ACETATE 50 MG PO TABS
50.0000 mg | ORAL_TABLET | Freq: Two times a day (BID) | ORAL | Status: DC
  Administered 2011-10-05 – 2011-10-07 (×4): 50 mg via ORAL
  Filled 2011-10-05 (×5): qty 1

## 2011-10-05 NOTE — Progress Notes (Signed)
Subjective:  Stable.  Does feel very tired and totally washed out. No symptoms of presyncope. BP in office without any orthostatic change yesterday.    Objective:  Vital Signs in the last 24 hours: Temp:  [97.9 F (36.6 C)-98.8 F (37.1 C)] 97.9 F (36.6 C) (04/24 0600) Pulse Rate:  [63-82] 82  (04/24 0932) Resp:  [18-20] 20  (04/24 0600) BP: (112-130)/(66-78) 123/78 mmHg (04/24 0932) SpO2:  [98 %-99 %] 98 % (04/24 0600) Weight:  [151 lb 12.8 oz (68.856 kg)] 151 lb 12.8 oz (68.856 kg) (04/23 1626)  Intake/Output from previous day:     Physical Exam: General: Well developed, well nourished, in no acute distress. Head:  Normocephalic and atraumatic. Lungs: Clear to auscultation and percussion. Heart: Normal S1 and S2.  No murmur, rubs or gallops.  Extremities: No clubbing or cyanosis. No edema. Neurologic: Alert and oriented x 3.    Lab Results:  [facility] 10/04/11 2020  WBC 6.9  HGB 13.4  PLT 220    Basename 10/04/11 2020 10/03/11 1212  NA 140 140  K 3.8 3.8  CL 102 105  CO2 28 28  GLUCOSE 87 77  BUN 17 9  CREATININE 0.72 0.6    Basename 10/04/11 2020  TROPONINI <0.30   Hepatic Function Panel  Basename 10/04/11 2020  PROT 7.2  ALBUMIN 4.4  AST 21  ALT 14  ALKPHOS 71  BILITOT 0.5  BILIDIR --  IBILI --   No results found for this basename: CHOL in the last 72 hours No results found for this basename: PROTIME in the last 72 hours  Imaging: No results found.    Assessment/Plan:  Patient was admitted for presyncope times two.  Holter shows NSVT, but usually with palpitations.  She had no palpitations.  Notably, she has always had a borderline prolonged QT, but the VT on holter was monomorphic, and rate about 130.  Doubt any of these are related.  I have discussed the case with EP in some detail.  Dr. Ladona Ridgel will see later.  If no high grade arrythmia, then she can go home in am.  However, the issue will be driving, and she is likely sidelined for now.   She would keep monitor at home.  She may eventually require a loop recorder.    Shawnie Pons 1:29 PM 10/05/2011       Shawnie Pons, MD, [facility], FSCAI 10/05/2011, 1:24 PM

## 2011-10-05 NOTE — Progress Notes (Signed)
UR Completed. Simmons, Aishwarya Shiplett F [phone]  
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
## 2011-10-06 ENCOUNTER — Encounter (HOSPITAL_COMMUNITY): Payer: Self-pay | Admitting: General Practice

## 2011-10-06 NOTE — Progress Notes (Signed)
Subjective:  She feels much better about medication.  No new symptoms.  Still has hot flashes.  Objective:  Vital Signs in the last 24 hours: Temp:  [97.7 F (36.5 C)-98.2 F (36.8 C)] 97.7 F (36.5 C) (04/25 0500) Pulse Rate:  [60-82] 64  (04/25 0500) Resp:  [16-20] 16  (04/25 0500) BP: (113-127)/(55-78) 127/75 mmHg (04/25 0500) SpO2:  [97 %-99 %] 99 % (04/25 0500) Weight:  [144 lb 13.5 oz (65.7 kg)] 144 lb 13.5 oz (65.7 kg) (04/25 0500)  Intake/Output from previous day:     Physical Exam: General: Well developed, well nourished, in no acute distress. Head:  Normocephalic and atraumatic. Lungs: Clear to auscultation and percussion. Heart: Normal S1 and S2.  No murmur, rubs or gallops.  Pulses: Pulses normal in all 4 extremities. Extremities: No clubbing or cyanosis. No edema. Neurologic: Alert and oriented x 3.    Lab Results:  [facility] 10/04/11 2020  WBC 6.9  HGB 13.4  PLT 220    Basename 10/04/11 2020 10/03/11 1212  NA 140 140  K 3.8 3.8  CL 102 105  CO2 28 28  GLUCOSE 87 77  BUN 17 9  CREATININE 0.72 0.6    Basename 10/04/11 2020  TROPONINI <0.30   Hepatic Function Panel  Basename 10/04/11 2020  PROT 7.2  ALBUMIN 4.4  AST 21  ALT 14  ALKPHOS 71  BILITOT 0.5  BILIDIR --  IBILI --   No results found for this basename: CHOL in the last 72 hours No results found for this basename: PROTIME in the last 72 hours  Imaging: No results found.  EKG:  Telemetry reviewed--no more VT  Cardiac Studies:  See OP information  Assessment/Plan:  Patient Active Hospital Problem List: Pre-syncope (10/04/2011)   Assessment: feel ok   Plan: plan dc tomorrow after 3 doses of flec NSVT (nonsustained ventricular tachycardia) (10/04/2011)   Assessment: continue to monitor   Plan: this should help  She will need repeat op GXT with Dr. Ladona Ridgel Plan dc am tomorrow Ambulate today.        Shawnie Pons, MD, [facility], FSCAI 10/06/2011, 9:18 AM

## 2011-10-07 ENCOUNTER — Observation Stay (HOSPITAL_COMMUNITY): Payer: Medicare Other

## 2011-10-07 ENCOUNTER — Encounter (HOSPITAL_COMMUNITY): Payer: Self-pay | Admitting: Physician Assistant

## 2011-10-07 ENCOUNTER — Inpatient Hospital Stay (HOSPITAL_COMMUNITY)
Admission: EM | Admit: 2011-10-07 | Discharge: 2011-10-10 | DRG: 313 | Disposition: A | Discharge status: Home / Self Care | Payer: Medicare Other | Attending: Cardiology | Admitting: Cardiology

## 2011-10-07 ENCOUNTER — Telehealth: Payer: Self-pay | Admitting: Cardiology

## 2011-10-07 ENCOUNTER — Inpatient Hospital Stay: Admission: RE | Admit: 2011-10-07 | Payer: Medicare Other | Source: Ambulatory Visit

## 2011-10-07 DIAGNOSIS — I472 Ventricular tachycardia, unspecified: Secondary | ICD-10-CM | POA: Diagnosis present

## 2011-10-07 DIAGNOSIS — Z8541 Personal history of malignant neoplasm of cervix uteri: Secondary | ICD-10-CM

## 2011-10-07 DIAGNOSIS — E119 Type 2 diabetes mellitus without complications: Secondary | ICD-10-CM | POA: Diagnosis present

## 2011-10-07 DIAGNOSIS — R079 Chest pain, unspecified: Principal | ICD-10-CM | POA: Diagnosis present

## 2011-10-07 DIAGNOSIS — I10 Essential (primary) hypertension: Secondary | ICD-10-CM

## 2011-10-07 DIAGNOSIS — K589 Irritable bowel syndrome without diarrhea: Secondary | ICD-10-CM | POA: Diagnosis present

## 2011-10-07 DIAGNOSIS — K219 Gastro-esophageal reflux disease without esophagitis: Secondary | ICD-10-CM | POA: Diagnosis present

## 2011-10-07 DIAGNOSIS — I4729 Other ventricular tachycardia: Secondary | ICD-10-CM | POA: Diagnosis present

## 2011-10-07 DIAGNOSIS — I2510 Atherosclerotic heart disease of native coronary artery without angina pectoris: Secondary | ICD-10-CM | POA: Diagnosis present

## 2011-10-07 DIAGNOSIS — Z7982 Long term (current) use of aspirin: Secondary | ICD-10-CM

## 2011-10-07 DIAGNOSIS — G894 Chronic pain syndrome: Secondary | ICD-10-CM | POA: Diagnosis present

## 2011-10-07 DIAGNOSIS — F411 Generalized anxiety disorder: Secondary | ICD-10-CM | POA: Diagnosis present

## 2011-10-07 DIAGNOSIS — R55 Syncope and collapse: Secondary | ICD-10-CM

## 2011-10-07 DIAGNOSIS — Z79899 Other long term (current) drug therapy: Secondary | ICD-10-CM

## 2011-10-07 HISTORY — DX: Other chronic pain: G89.29

## 2011-10-07 HISTORY — DX: Chest pain, unspecified: R07.9

## 2011-10-07 LAB — DIFFERENTIAL
Basophils Relative: 0 % (ref 0–1)
Eosinophils Absolute: 0.1 10*3/uL (ref 0.0–0.7)
Eosinophils Relative: 2 % (ref 0–5)
Lymphs Abs: 2 10*3/uL (ref 0.7–4.0)
Neutrophils Relative %: 61 % (ref 43–77)

## 2011-10-07 LAB — CBC
HCT: 41.4 % (ref 36.0–46.0)
Hemoglobin: 14.2 g/dL (ref 12.0–15.0)
MCH: 27.8 pg (ref 26.0–34.0)
MCH: 28 pg (ref 26.0–34.0)
MCHC: 33.7 g/dL (ref 30.0–36.0)
MCHC: 34.3 g/dL (ref 30.0–36.0)
MCV: 82.4 fL (ref 78.0–100.0)
Platelets: 224 10*3/uL (ref 150–400)
RDW: 12.6 % (ref 11.5–15.5)

## 2011-10-07 LAB — COMPREHENSIVE METABOLIC PANEL
ALT: 14 U/L (ref 0–35)
Albumin: 4.2 g/dL (ref 3.5–5.2)
Calcium: 10.3 mg/dL (ref 8.4–10.5)
GFR calc Af Amer: >90 mL/min (ref >90)
Glucose, Bld: 131 mg/dL — ABNORMAL HIGH (ref 70–99)
Sodium: 139 mEq/L (ref 135–145)
Total Protein: 7.1 g/dL (ref 6.0–8.3)

## 2011-10-07 LAB — CK TOTAL AND CKMB (NOT AT ARMC)
Relative Index: INVALID (ref 0.0–2.5)
Total CK: 47 U/L (ref 7–177)

## 2011-10-07 LAB — CARDIAC PANEL(CRET KIN+CKTOT+MB+TROPI): Relative Index: INVALID (ref 0.0–2.5)

## 2011-10-07 LAB — TROPONIN I: Troponin I: <0.3 ng/mL (ref <0.30)

## 2011-10-07 LAB — CREATININE, SERUM: GFR calc non Af Amer: >90 mL/min (ref >90)

## 2011-10-07 MED ORDER — NAPROXEN SODIUM 220 MG PO TABS: 220.0000 mg | ORAL_TABLET | Freq: Two times a day (BID) | ORAL | Status: DC | PRN

## 2011-10-07 MED ORDER — FLECAINIDE ACETATE 50 MG PO TABS: 50.0000 mg | ORAL_TABLET | Freq: Two times a day (BID) | ORAL | Status: DC

## 2011-10-07 MED ORDER — ENOXAPARIN SODIUM 40 MG/0.4ML ~~LOC~~ SOLN
40.0000 mg | SUBCUTANEOUS | Status: DC
  Administered 2011-10-08 – 2011-10-09 (×2): 40 mg via SUBCUTANEOUS
  Filled 2011-10-07 (×4): qty 0.4

## 2011-10-07 MED ORDER — SODIUM CHLORIDE 0.9 % IJ SOLN: 3.0000 mL | INTRAMUSCULAR | Status: DC | PRN

## 2011-10-07 MED ORDER — ESTRADIOL 1 MG PO TABS
1.0000 mg | ORAL_TABLET | Freq: Every day | ORAL | Status: DC
  Administered 2011-10-08 – 2011-10-10 (×3): 1 mg via ORAL
  Filled 2011-10-07 (×4): qty 1

## 2011-10-07 MED ORDER — ALPRAZOLAM 0.5 MG PO TABS
0.5000 mg | ORAL_TABLET | Freq: Three times a day (TID) | ORAL | Status: DC | PRN
  Administered 2011-10-07: 0.5 mg via ORAL
  Filled 2011-10-07: qty 1

## 2011-10-07 MED ORDER — SODIUM CHLORIDE 0.9 % IV SOLN: 250.0000 mL | INTRAVENOUS | Status: DC | PRN

## 2011-10-07 MED ORDER — VITAMIN D3 25 MCG (1000 UNIT) PO TABS
2000.0000 [IU] | ORAL_TABLET | Freq: Every day | ORAL | Status: DC
  Administered 2011-10-08 – 2011-10-10 (×3): 2000 [IU] via ORAL
  Filled 2011-10-07 (×4): qty 2

## 2011-10-07 MED ORDER — ZOLPIDEM TARTRATE 5 MG PO TABS: 5.0000 mg | ORAL_TABLET | Freq: Every evening | ORAL | Status: DC | PRN

## 2011-10-07 MED ORDER — MEDROXYPROGESTERONE ACETATE 2.5 MG PO TABS
2.5000 mg | ORAL_TABLET | Freq: Every day | ORAL | Status: DC
  Administered 2011-10-08 – 2011-10-10 (×3): 2.5 mg via ORAL
  Filled 2011-10-07 (×4): qty 1

## 2011-10-07 MED ORDER — ACETAMINOPHEN 325 MG PO TABS
650.0000 mg | ORAL_TABLET | ORAL | Status: DC | PRN
  Administered 2011-10-07: 650 mg via ORAL
  Filled 2011-10-07: qty 2

## 2011-10-07 MED ORDER — NITROGLYCERIN 0.4 MG SL SUBL: 0.4000 mg | SUBLINGUAL_TABLET | SUBLINGUAL | Status: DC | PRN

## 2011-10-07 MED ORDER — NAPROXEN 250 MG PO TABS
250.0000 mg | ORAL_TABLET | Freq: Two times a day (BID) | ORAL | Status: DC | PRN
  Filled 2011-10-07: qty 1

## 2011-10-07 MED ORDER — SODIUM CHLORIDE 0.9 % IJ SOLN
3.0000 mL | Freq: Two times a day (BID) | INTRAMUSCULAR | Status: DC
  Administered 2011-10-07 – 2011-10-10 (×6): 3 mL via INTRAVENOUS

## 2011-10-07 MED ORDER — DICYCLOMINE HCL 20 MG PO TABS
20.0000 mg | ORAL_TABLET | Freq: Every day | ORAL | Status: DC
  Administered 2011-10-08 – 2011-10-10 (×3): 20 mg via ORAL
  Filled 2011-10-07 (×4): qty 1

## 2011-10-07 MED ORDER — ALPRAZOLAM 0.25 MG PO TABS: 0.2500 mg | ORAL_TABLET | Freq: Two times a day (BID) | ORAL | Status: DC | PRN

## 2011-10-07 MED ORDER — ASPIRIN EC 81 MG PO TBEC
81.0000 mg | DELAYED_RELEASE_TABLET | Freq: Every day | ORAL | Status: DC
  Administered 2011-10-08 – 2011-10-10 (×3): 81 mg via ORAL
  Filled 2011-10-07 (×3): qty 1

## 2011-10-07 MED ORDER — ONDANSETRON HCL 4 MG/2ML IJ SOLN: 4.0000 mg | Freq: Four times a day (QID) | INTRAMUSCULAR | Status: DC | PRN

## 2011-10-07 NOTE — Discharge Summary (Signed)
Discharge Summary   Patient ID: Renee [name],  MRN: [id number], DOB/AGE: 1945-12-14 66 y.o.  Admit date: 10/04/2011 Discharge date: 10/07/2011  Discharge Diagnoses Principal Problem:  *Pre-syncope Active Problems:  HYPERTENSION  NSVT (nonsustained ventricular tachycardia)  Allergies Allergies  Allergen Reactions  . Moxifloxacin Other (See Comments)    REACTION: pt states she can't take Avelox...   Diagnostic Studies/Procedures  None  History of Present Illness  66yo female with PMHx significant for AVNRT (s/p RFA 1993- Dr. Delena Serve, [facility] Mc), history of prolonged QT, CAD (cath 2005- nonobstructive; LCx 30-40%; stress Myoview 03/13- low risk; 2D echo 04/13 with LVEF 60-65%), HTN, GERD and chronic chest pain (non-ischemic) who was admitted to [facility] on 10/04/11 with presyncope.   She attended a previously scheduled follow-up appointment with Dr. Riley Kill the date of admission. She reported at several week history of intermittent palpitation with associated weakness and presyncope beginning approximately 5 days prior. She was wearing an event monitor, however did not activate it during these episodes. Upon review, the monitor demonstrated brief paroxysms of NSVT- rate 120-130, monomorphic. K and Mg were found to be WNL on a lab draw the day prior. She denied chest pain. Her symptoms were felt to be unlikely secondary to NSVT, but she was subsequently direct-admitted for overnight observation to determine if this was the issue.   Hospital Course   Outpatient medications were resumed. She remained stable overnight without acute events noted on telemetry. EKG on admission revealed no evidence of ischemia or arrhythmia. QTc was noted to be borderline prolonged, but this had been noted in the past. The decision was made to pursue EP consultation for further recommendations. Given her complaints and symptoms associated with her palpitations, she was started on flecainide. There was  no driving restriction recommended.   After the initial dose, the patient reported feeling fatigued, but denied palpitations, presyncope, lightheadedness, dizziness, blurry vision, shortness of breath. She ambulated in the halls well without incident.   Today, she is stable and asymptomatic. Her fatigue has improved. Vital signs have remained stable during this admission. CBC, BMET, CEs, pBNP and TSH were WNL. She has received a total of 3 doses of flecainide. This will be resumed on discharge. Additionally, she will be scheduled for repeat GXT. She will follow-up with Dr. Ladona Ridgel and Dr. Riley Kill, respectively. This will be scheduled for her. There was no evidence of arrhythmia or NSVT on admission. QTc remained at baseline. She has been advised by Dr. Riley Kill to abstain from driving until she is confident and comfortable that she is not experiencing presyncopal symptoms. This information has been clearly outlined in the discharge AVS.   Addendum: Patient went into a narrow-complex tachycardia up to 140 bpm just prior to discharge. She was standing and brushing her teeth at the time, and then notes becoming lightheaded and short of breath. Upon further review with Dr. Riley Kill and Dr. Ladona Ridgel, felt to represent sinus tachycardia. As noted above, CBC was WNL. No evidence of active bleeding. Felt to represent autonomic dysfunction vs orthostatic changes. She was encouraged to drink plenty of fluids and not decrease her salt intake. Triamterene will be held. She is to follow-up with Dr. Riley Kill in the office next week when this will be readdressed.   Discharge Vitals:  Blood pressure 107/69, pulse 64, temperature 98 F (36.7 C), temperature source Oral, resp. rate 16, weight 65.7 kg (144 lb 13.5 oz), SpO2 96.00%.   Weight change: 0 kg (0 lb)  Labs: Recent Labs  Basename 10/04/11 2020   WBC 6.9   HGB 13.4   HCT 39.6   MCV 82.3   PLT 220    Lab 10/04/11 2020 10/03/11 1212  NA 140 140  K 3.8 3.8    CL 102 105  CO2 28 28  BUN 17 9  CREATININE 0.72 0.6  CALCIUM 9.8 9.3  PROT 7.2 --  BILITOT 0.5 --  ALKPHOS 71 --  ALT 14 --  AST 21 --  AMYLASE -- --  LIPASE -- --  GLUCOSE 87 77   Recent Labs  Basename 10/04/11 2020   CKTOTAL 86   CKMB 3.1   CKMBINDEX --   TROPONINI <0.30    Basename 10/04/11 2020  TSH 1.445  T4TOTAL --  T3FREE --  THYROIDAB --    Disposition:  Discharge Orders    Future Appointments: Provider: Department: Dept Phone: Center:   10/07/2011 12:30 PM [facility] [phone] [facility]   10/11/2011 11:15 AM Herby Abraham, MD [facility] [phone] [facility]     Follow-up Information    Follow up with Lewayne Bunting, MD on 11/02/2011. (At 9:00 AM)    Contact information:   [address] [place] [address] [phone]        Discharge Medications:  Medication List  As of 10/07/2011  8:52 AM   START taking these medications         flecainide 50 MG tablet   Commonly known as: TAMBOCOR   Take 1 tablet (50 mg total) by mouth every 12 (twelve) hours.         CONTINUE taking these medications         ALPRAZolam 0.5 MG tablet   Commonly known as: XANAX      aspirin EC 81 MG tablet      cholecalciferol 1000 UNITS tablet   Commonly known as: VITAMIN D      dicyclomine 20 MG tablet   Commonly known as: BENTYL      naproxen sodium 220 MG tablet   Commonly known as: ANAPROX      nitroGLYCERIN 0.4 MG SL tablet   Commonly known as: NITROSTAT      Nutritional Drink Liqd      triamterene-hydrochlorothiazide 37.5-25 MG per tablet   Commonly known as: MAXZIDE-25          Where to get your medications    These are the prescriptions that you need to pick up. We sent them to a specific pharmacy, so you will need to go there to get them.   [id number] - [place], [place] - [address]    [address] [place] [address]    Phone:  [phone]        flecainide 50 MG tablet           Outstanding Labs/Studies: None  Duration of Discharge Encounter: Greater than 30 minutes including physician time.  Signed, R. Hurman Horn, PA-C 10/07/2011, 8:52 AM

## 2011-10-07 NOTE — ED Notes (Signed)
Patient states she was discharged to from [facility] after several days of testing and treatment for chest pain. Patient states she was at home this evening and started to have mid sternal chest pain and took 3 sublingual nitro with relief of chest pain and c/o headache after taking nitro. Patient denies chest pain and SOB. Patient placed on monitor and sats of 99% on RA. Family at bedside.

## 2011-10-07 NOTE — Discharge Instructions (Signed)
PLEASE REMEMBER TO BRING ALL OF YOUR MEDICATIONS TO EACH OF YOUR FOLLOW-UP OFFICE VISITS.  PLEASE ATTEND ALL SCHEDULED FOLLOW-UP APPOINTMENTS. PLEASE TAKE ALL NEW MEDICATIONS AS PRESCRIBED. PLEASE HOLD OFF ON TAKING TRIAMTERENE UNTIL YOU FOLLOW-UP WITH DR. Riley Kill.   PLEASE DRINK PLENTY OF FLUID AND DO NOT REDUCE YOUR SALT INTAKE.

## 2011-10-07 NOTE — ED Notes (Signed)
Patient sleeping with NAD at this time. Patient remains on monitor and sats of 99% on RA.

## 2011-10-07 NOTE — Progress Notes (Signed)
Patient Name: Renee [name] Date of Encounter: 10/07/2011     Active Problems:  HYPERTENSION  Pre-syncope  NSVT (nonsustained ventricular tachycardia)    SUBJECTIVE: Feels well this morning. No complaints. Ambulated well yesterday without incident. Specifically denies dizziness, lightheadedness, palpitations, chest pain, sob or blurry vision.   OBJECTIVE  Filed Vitals:   10/06/11 0939 10/06/11 1400 10/06/11 2100 10/07/11 0500  BP: 128/76 112/60 144/76 107/69  Pulse: 75 80 86 64  Temp:  97.6 F (36.4 C) 97.5 F (36.4 C) 98 F (36.7 C)  TempSrc:  Oral Oral Oral  Resp:  18 20 16   Weight:    65.7 kg (144 lb 13.5 oz)  SpO2:  97% 99% 96%    Intake/Output Summary (Last 24 hours) at 10/07/11 8469 Last data filed at 10/06/11 2113  Gross per 24 hour  Intake      3 ml  Output      0 ml  Net      3 ml   Weight change: 0 kg (0 lb)  PHYSICAL EXAM  General: Well developed, well nourished, in no acute distress. Head: Normocephalic, atraumatic, sclera non-icteric, no xanthomas Neck: Supple without bruits or JVD. Lungs:  Resp regular and unlabored, CTAB without wheezes, rales or rhonchi Heart: RRR no s3, s4, or murmurs. Abdomen: Soft, non-tender, non-distended, BS + x 4.  Msk:  Strength and tone appears normal for age. Extremities: No clubbing, cyanosis or edema. DP/PT/Radials 2+ and equal bilaterally. Neuro: Alert and oriented X 3. Moves all extremities spontaneously. Psych: Normal affect.  LABS:  Recent Labs  [facility] 10/04/11 2020   WBC 6.9   HGB 13.4   HCT 39.6   MCV 82.3   PLT 220   Lab 10/04/11 2020 10/03/11 1212  NA 140 140  K 3.8 3.8  CL 102 105  CO2 28 28  BUN 17 9  CREATININE 0.72 0.6  CALCIUM 9.8 9.3  PROT 7.2 --  BILITOT 0.5 --  ALKPHOS 71 --  ALT 14 --  AST 21 --  AMYLASE -- --  LIPASE -- --  GLUCOSE 87 77   Recent Labs  Basename 10/04/11 2020   CKTOTAL 86   CKMB 3.1   CKMBINDEX --   TROPONINI <0.30   Basename 10/04/11 2020  TSH  1.445  T4TOTAL --  T3FREE --  THYROIDAB --    TELE: NSR, 60-80 bpm; no evidence of NSVT, atrial tach or PVCs   Radiology/Studies:  No results found.  Current Medications:     . aspirin EC  81 mg Oral Daily  . dicyclomine  20 mg Oral Q6H  . enoxaparin  40 mg Subcutaneous Q24H  . flecainide  50 mg Oral Q12H  . sodium chloride  3 mL Intravenous Q12H  . sodium chloride  3 mL Intravenous Q12H  . triamterene-hydrochlorothiazide  0.5 each Oral Daily    ASSESSMENT AND PLAN:  66yo female with PMHx significant for AVNRT (s/p RFA 1993- Dr. Delena Serve, Kateri Mc), history of prolonged QT, CAD (cath 2005- nonobstructive; LCx 30-40%; stress Myoview 03/13- low risk), HTN,  GERD and chronic chest pain (non-ischemic) who was admitted to [facility] on 10/04/11 with presyncope.   1. Presyncope- patient doing well this morning. Ambulated yesterday without incident. On telemetry, she is maintaining NSR. Tolerating flecainide well. QTc on telemetry this morning unchanged from admission EKG. CBC, BMET, TSH, cardiac biomarkers and pBNP unremarkable.   - Anticipate discharge today  - Follow-up outpatient with Dr. Ladona Ridgel, Dr. Riley Kill   -  No driving restrictions per Dr. Ladona Ridgel.  - Continue Flecainide   - Will schedule GXT  - Monitoring?   - Will discuss further recs per Dr. Riley Kill this AM  2. NSVT- no evidence of this on telemetry. Not felt to be related to #1.   3. HTN- well-controlled  - Continue outpatient antihypertensives    Signed, R. Hurman Horn, PA-C 10/07/2011, 6:39 AM  Doing very well.  Pt examined and seen, telemetry reviewed, and case discussed with this nice lady.  Lungs clear.  I agree with assessment.  Home today.  I asked her to ambulate over the weekend, and not considering driving yet until she feels none of what she experienced previously, and is confident about that.  See Dr . Lubertha Basque note.  Appreciate EP input and recommendations.  We will follow her up in the office.      More than thirty minutes of combined dc time.

## 2011-10-07 NOTE — Telephone Encounter (Signed)
She describes having front to back chest pain radiating to the right neck. At this time she does not wish to discuss further with me her current condition "just get Dr. Riley Kill or [name] on the phone" Dr. Riley Kill has reviewed and recommended pt to be readmitted.  Pt is aware. At this time her chest pain has subsided. She will have her daughter bring her back to [facility]. Dayna Dunn aware. Mylo Red RN

## 2011-10-07 NOTE — ED Notes (Signed)
Patient resting with NAD at this time. Patient remains on monitor and sats of 98%. Patient denies chest pain.
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
## 2011-10-07 NOTE — ED Provider Notes (Signed)
Patient discharged today after admission for syncope and SVT.  Upon returning home today, patient redeveloped chest pain with diaphoresis, near syncope.  Chest pain relieved after NTG x 3.  Patient is currently pain free, resting comfortably.  Lungs CTA bilaterally.  S1/S2, RRR, no murmur.  Abdomen soft, bowel sounds present.  Strong distal pulses palpated all extremities.  Dr. Riley Kill at bedside upon patient's arrival in CDU, has evaluated patient and written admission orders.  Jimmye Norman, NP 10/07/11 1931

## 2011-10-07 NOTE — Telephone Encounter (Signed)
Chest pain in the last 15-20 minutes, has taken two nitro's, having SOB, sending to triage

## 2011-10-07 NOTE — Telephone Encounter (Signed)
Pt calls today b/c she has just arrived home and has taken 3 sl NTG with relief " I just don't feel good and I have no energy". She describes the

## 2011-10-07 NOTE — ED Notes (Signed)
She just left the hospital this am and she left at 1300.  She has had chest pain this afternoon and she has taken 3 sl nitro with relief.  No pain at present

## 2011-10-07 NOTE — Progress Notes (Signed)
Pt discharged to home per MD order. Pt received all discharge instructions and medication information including follow-up appointment information and prescription pick-up information.  Pt escorted to exit by volunteer services. Efraim Kaufmann

## 2011-10-08 DIAGNOSIS — R079 Chest pain, unspecified: Principal | ICD-10-CM

## 2011-10-08 LAB — CARDIAC PANEL(CRET KIN+CKTOT+MB+TROPI)
Relative Index: INVALID (ref 0.0–2.5)
Relative Index: INVALID (ref 0.0–2.5)
Total CK: 36 U/L (ref 7–177)
Troponin I: <0.3 ng/mL (ref <0.30)

## 2011-10-08 NOTE — ED Provider Notes (Signed)
Medical screening examination/treatment/procedure(s) were performed by non-physician practitioner and as supervising physician I was immediately available for consultation/collaboration.   Lyanne Co, MD 10/08/11 [phone]

## 2011-10-08 NOTE — Progress Notes (Signed)
SUBJECTIVE:  Overwhelmingly fatigued but no further chest pain.   PHYSICAL EXAM Filed Vitals:   10/07/11 1838 10/07/11 2100 10/07/11 2206 10/08/11 0416  BP: 138/71 108/52 120/82 105/73  Pulse: 75 74 65 71  Temp: 97.9 F (36.6 C)  97.8 F (36.6 C) 97.9 F (36.6 C)  TempSrc: Oral  Oral Oral  Resp: 16 17 18 18   Height:  5\' 4"  (1.626 m) 5\' 4"  (1.626 m)   Weight:  144 lb 13.5 oz (65.701 kg) 143 lb 8.3 oz (65.1 kg) 147 lb 11.2 oz (66.996 kg)  SpO2: 99% 99% 99% 98%   General:  No distress Lungs:  Clear Heart:  RRR Abdomen:  Positive bowel sounds, no rebound no guarding Extremities:  No edema.  LABS: Lab Results  Component Value Date   CKTOTAL 37 10/08/2011   CKMB 1.7 10/08/2011   TROPONINI <0.30 10/08/2011   Results for orders placed during the hospital encounter of 10/07/11 (from the past 24 hour(s))  CBC     Status: Abnormal   Collection Time   10/07/11  5:42 PM      Component Value Range   WBC 6.2  4.0 - 10.5 (K/uL)   RBC 5.29 (*) 3.87 - 5.11 (MIL/uL)   Hemoglobin 14.7  12.0 - 15.0 (g/dL)   HCT 11.9  14.7 - 82.9 (%)   MCV 82.4  78.0 - 100.0 (fL)   MCH 27.8  26.0 - 34.0 (pg)   MCHC 33.7  30.0 - 36.0 (g/dL)   RDW 56.2  13.0 - 86.5 (%)   Platelets 224  150 - 400 (K/uL)  DIFFERENTIAL     Status: Normal   Collection Time   10/07/11  5:42 PM      Component Value Range   Neutrophils Relative 61  43 - 77 (%)   Neutro Abs 3.7  1.7 - 7.7 (K/uL)   Lymphocytes Relative 32  12 - 46 (%)   Lymphs Abs 2.0  0.7 - 4.0 (K/uL)   Monocytes Relative 6  3 - 12 (%)   Monocytes Absolute 0.3  0.1 - 1.0 (K/uL)   Eosinophils Relative 2  0 - 5 (%)   Eosinophils Absolute 0.1  0.0 - 0.7 (K/uL)   Basophils Relative 0  0 - 1 (%)   Basophils Absolute 0.0  0.0 - 0.1 (K/uL)  COMPREHENSIVE METABOLIC PANEL     Status: Abnormal   Collection Time   10/07/11  5:42 PM      Component Value Range   Sodium 139  135 - 145 (mEq/L)   Potassium 3.8  3.5 - 5.1 (mEq/L)   Chloride 100  96 - 112 (mEq/L)   CO2  27  19 - 32 (mEq/L)   Glucose, Bld 131 (*) 70 - 99 (mg/dL)   BUN 14  6 - 23 (mg/dL)   Creatinine, Ser 7.84  0.50 - 1.10 (mg/dL)   Calcium 69.6  8.4 - 10.5 (mg/dL)   Total Protein 7.1  6.0 - 8.3 (g/dL)   Albumin 4.2  3.5 - 5.2 (g/dL)   AST 17  0 - 37 (U/L)   ALT 14  0 - 35 (U/L)   Alkaline Phosphatase 71  39 - 117 (U/L)   Total Bilirubin 0.4  0.3 - 1.2 (mg/dL)   GFR calc non Af Amer 85 (*) >90 (mL/min)   GFR calc Af Amer >90  >90 (mL/min)  CK TOTAL AND CKMB     Status: Normal   Collection Time  10/07/11  5:44 PM      Component Value Range   Total CK 47  7 - 177 (U/L)   CK, MB 1.8  0.3 - 4.0 (ng/mL)   Relative Index RELATIVE INDEX IS INVALID  0.0 - 2.5   TROPONIN I     Status: Normal   Collection Time   10/07/11  5:44 PM      Component Value Range   Troponin I <0.30  <0.30 (ng/mL)  D-DIMER, QUANTITATIVE     Status: Normal   Collection Time   10/07/11  7:14 PM      Component Value Range   D-Dimer, Quant 0.23  0.00 - 0.48 (ug/mL-FEU)  CARDIAC PANEL(CRET KIN+CKTOT+MB+TROPI)     Status: Normal   Collection Time   10/07/11 10:01 PM      Component Value Range   Total CK 42  7 - 177 (U/L)   CK, MB 1.8  0.3 - 4.0 (ng/mL)   Troponin I <0.30  <0.30 (ng/mL)   Relative Index RELATIVE INDEX IS INVALID  0.0 - 2.5   CBC     Status: Normal   Collection Time   10/07/11 10:02 PM      Component Value Range   WBC 6.6  4.0 - 10.5 (K/uL)   RBC 5.08  3.87 - 5.11 (MIL/uL)   Hemoglobin 14.2  12.0 - 15.0 (g/dL)   HCT 33.2  95.1 - 88.4 (%)   MCV 81.5  78.0 - 100.0 (fL)   MCH 28.0  26.0 - 34.0 (pg)   MCHC 34.3  30.0 - 36.0 (g/dL)   RDW 16.6  06.3 - 01.6 (%)   Platelets 197  150 - 400 (K/uL)  CREATININE, SERUM     Status: Normal   Collection Time   10/07/11 10:02 PM      Component Value Range   Creatinine, Ser 0.62  0.50 - 1.10 (mg/dL)   GFR calc non Af Amer >90  >90 (mL/min)   GFR calc Af Amer >90  >90 (mL/min)  CARDIAC PANEL(CRET KIN+CKTOT+MB+TROPI)     Status: Normal   Collection Time    10/08/11  4:15 AM      Component Value Range   Total CK 36  7 - 177 (U/L)   CK, MB 1.8  0.3 - 4.0 (ng/mL)   Troponin I <0.30  <0.30 (ng/mL)   Relative Index RELATIVE INDEX IS INVALID  0.0 - 2.5   CARDIAC PANEL(CRET KIN+CKTOT+MB+TROPI)     Status: Normal   Collection Time   10/08/11  9:05 AM      Component Value Range   Total CK 37  7 - 177 (U/L)   CK, MB 1.7  0.3 - 4.0 (ng/mL)   Troponin I <0.30  <0.30 (ng/mL)   Relative Index RELATIVE INDEX IS INVALID  0.0 - 2.5     Intake/Output Summary (Last 24 hours) at 10/08/11 1017 Last data filed at 10/08/11 0825  Gross per 24 hour  Intake    240 ml  Output    600 ml  Net   -360 ml    ASSESSMENT AND PLAN:  1. Chest pain:  Ruled out.  Plan is coronary CT angiography on Monday.  D Dimer WNL.    2. Recent NSVT: Flecainide on hold per Dr. Riley Kill.  No sustained arrhythmias on telemetry.       Fayrene Fearing [facility] 10/08/2011 10:17 AM

## 2011-10-09 MED ORDER — GUAIFENESIN ER 600 MG PO TB12
600.0000 mg | ORAL_TABLET | Freq: Two times a day (BID) | ORAL | Status: DC
  Administered 2011-10-09 – 2011-10-10 (×3): 600 mg via ORAL
  Filled 2011-10-09 (×6): qty 1

## 2011-10-09 MED ORDER — METOPROLOL TARTRATE 12.5 MG HALF TABLET
12.5000 mg | ORAL_TABLET | Freq: Two times a day (BID) | ORAL | Status: AC
  Administered 2011-10-09 – 2011-10-10 (×2): 12.5 mg via ORAL
  Filled 2011-10-09 (×2): qty 1

## 2011-10-09 NOTE — Progress Notes (Signed)
     SUBJECTIVE:  No further chest pain.Marland Kitchen  Upper airway congestion   PHYSICAL EXAM Filed Vitals:   10/08/11 0416 10/08/11 1443 10/08/11 2054 10/09/11 0422  BP: 105/73 110/76 122/74 109/68  Pulse: 71 76 74 61  Temp: 97.9 F (36.6 C) 98.6 F (37 C) 98 F (36.7 C) 98 F (36.7 C)  TempSrc: Oral  Oral Oral  Resp: 18 18 19 18   Height:      Weight: 147 lb 11.2 oz (66.996 kg)   148 lb (67.132 kg)  SpO2: 98% 98% 98% 97%   General:  No distress Lungs:  Clear Heart:  RRR Abdomen:  Positive bowel sounds, no rebound no guarding Extremities:  No edema.  LABS: Lab Results  Component Value Date   CKTOTAL 37 10/08/2011   CKMB 1.7 10/08/2011   TROPONINI <0.30 10/08/2011     Intake/Output Summary (Last 24 hours) at 10/09/11 0944 Last data filed at 10/09/11 0803  Gross per 24 hour  Intake    720 ml  Output   1700 ml  Net   -980 ml    ASSESSMENT AND PLAN:  1. Chest pain:  Ruled out.  Plan is coronary CT angiography which I have ordered for Monday.    2. Recent NSVT: Flecainide on hold per Dr. Riley Kill.  No sustained arrhythmias on telemetry.       Fayrene Fearing [facility] 10/09/2011 9:44 AM

## 2011-10-10 ENCOUNTER — Observation Stay (HOSPITAL_COMMUNITY): Payer: Medicare Other

## 2011-10-10 DIAGNOSIS — R079 Chest pain, unspecified: Secondary | ICD-10-CM

## 2011-10-10 DIAGNOSIS — R55 Syncope and collapse: Secondary | ICD-10-CM

## 2011-10-10 MED ORDER — METOPROLOL TARTRATE 1 MG/ML IV SOLN
INTRAVENOUS | Status: AC
  Filled 2011-10-10: qty 10

## 2011-10-10 MED ORDER — DEXTROSE 5 % IV SOLN
150.0000 mg | Freq: Once | INTRAVENOUS | Status: AC
  Administered 2011-10-10: 150 mg via INTRAVENOUS
  Filled 2011-10-10 (×2): qty 3

## 2011-10-10 MED ORDER — NITROGLYCERIN 0.4 MG SL SUBL
SUBLINGUAL_TABLET | SUBLINGUAL | Status: AC
  Filled 2011-10-10: qty 25

## 2011-10-10 MED ORDER — IOHEXOL 350 MG/ML SOLN
80.0000 mL | Freq: Once | INTRAVENOUS | Status: AC | PRN
  Administered 2011-10-10: 80 mL via INTRAVENOUS

## 2011-10-10 NOTE — Progress Notes (Signed)
Subjective:  No further pain.  CT performed with Calcium score of 0 and no significant obstruction  Objective:  Vital Signs in the last 24 hours: Temp:  [98.3 F (36.8 C)-98.7 F (37.1 C)] 98.7 F (37.1 C) (04/29 1312) Pulse Rate:  [64-67] 64  (04/29 1312) Resp:  [18-19] 18  (04/29 1312) BP: (97-105)/(62-67) 97/62 mmHg (04/29 1312) SpO2:  [96 %-97 %] 97 % (04/29 1312) Weight:  [148 lb 11.2 oz (67.45 kg)] 148 lb 11.2 oz (67.45 kg) (04/29 0417)  Intake/Output from previous day: 04/28 0701 - 04/29 0700 In: 1080 [P.O.:1080] Out: 2900 [Urine:2900]   Physical Exam: General: Well developed, well nourished, in no acute distress. Head:  Normocephalic and atraumatic. Lungs: Clear to auscultation and percussion. Heart: Normal S1 and S2.  No murmur, rubs or gallops.  Pulses: Pulses normal in all 4 extremities. Extremities: No clubbing or cyanosis. No edema. Neurologic: Alert and oriented x 3.    Lab Results:  Basename 10/07/11 2202 10/07/11 1742  WBC 6.6 6.2  HGB 14.2 14.7  PLT 197 224    Basename 10/07/11 2202 10/07/11 1742  NA -- 139  K -- 3.8  CL -- 100  CO2 -- 27  GLUCOSE -- 131*  BUN -- 14  CREATININE 0.62 0.78    Basename 10/08/11 0905 10/08/11 0415  TROPONINI <0.30 <0.30   Hepatic Function Panel  Basename 10/07/11 1742  PROT 7.1  ALBUMIN 4.2  AST 17  ALT 14  ALKPHOS 71  BILITOT 0.4  BILIDIR --  IBILI --   No results found for this basename: CHOL in the last 72 hours No results found for this basename: PROTIME in the last 72 hours  Imaging: Ct Coronary Morp W/cta Cor W/score W/ca W/cm &/or Wo/cm  10/10/2011  **ADDENDUM** CREATED: 10/10/2011 11:33:17  OVER-READ INTERPRETATION - CT CHEST  The following report is an over-read performed by radiologist Dr. Charline Bills, M.D. of [facility] on 10/10/2011 11:33:17.  This over-read does not include interpretation of cardiac or coronary anatomy or pathology.  The CTA interpretation by the  cardiologist is attached.  Comparison:  Chest radiographs dated 10/07/2011  Findings: Mild dependent atelectasis at the lung bases.  No suspicious pulmonary nodules. No pleural effusion or pneumothorax.  6 mm precarinal lymph node.  No suspicious mediastinal lymphadenopathy.  Visualized upper abdomen is unremarkable.  Mild degenerate changes of the visualized thoracic spine.  IMPRESSION: No significant extracardiac findings are seen.  **END ADDENDUM** SIGNED BY: Charline Bills, M.D.    10/10/2011  Cardiac CT:  Indication: Chest Pain  Protocol:  The patient was scanned on a Philips 256 scanner. Gantry rotation speed was 270 msec.  Collimation was .9mm.  The patient received 150 mg of iv amiodarone on the floor for PAC;s an additional 5mg  of iv lopresser and nitro were given  during the scan.  Average HR was 62 bpm.  After initial AP and lateral scouts and 3mm axial noncontrast was done through the heart for calcium scoring using the Agatson method.  The patient then received 80cc of contrast with a prospective 100kV scan triggered in the descending thoracic aorta at 111 HU's.  Radiation dose was minimized using idose 3 and reducing mA to 300.  A lead shield was used below the waist  Findings:  Calcium Score: 0  Coronary Arteries:  Right dominant with no anomalies  LM- normal  LAD- normal diffusely small after second diagonal branch        D1:- normal  D2:- normal  Circumflex: normal        OM1: normal       OM2: normal  RCA: normal one large RV branch normal        PDA/PLA: small vessels and normal  Noncardiac:  The lung and soft tissue windows showed no significant abnormalities.  See separate report from [facility]  Impression:     1)    Calcium Score 0        2)    Normal right dominant coronary arteries  Cc: Dr Riley Kill Original Report Authenticated By: Charline Bills, M.D.      Cardiac Studies:  SEE CTA report in EPIC.    Assessment/Plan:  1.  Chest pain of uncertain cause--neg  enzymes, CTA without obstruction 2.  NSVT  --- no recurrence in hospital.  Flecainide discussed with Dr. Jeanelle Malling can use.  However, would send home on no therapy for now.  Plan follow up with me in 1-2 weeks.  Patient reassured.    Renee [name]  1:51 PM 10/10/2011       Renee Pons, MD, [facility], FSCAI 10/10/2011, 1:48 PM

## 2011-10-10 NOTE — Progress Notes (Addendum)
   CARE MANAGEMENT NOTE 10/10/2011  Patient:  Renee [name], Renee [name]   Account Number:  [id number]  Date Initiated:  10/10/2011  Documentation initiated by:  GRAVES-BIGELOW,Lamaria Hildebrandt  Subjective/Objective Assessment:   Pt admitted with cp. Plan for home today. No needs for pt. Pt did have a concern for help with her mom. She provides total care.     Action/Plan:   CM did provide pt with information on PCS- a list. Pt is aware that PCS will not be covered via insurance.   Anticipated DC Date:  10/10/2011   Anticipated DC Plan:  HOME/SELF CARE      DC Planning Services  CM consult      Choice offered to / List presented to:             Status of service:  Completed, signed off Medicare Important Message given?   (If response is "NO", the following Medicare IM given date fields will be blank) Date Medicare IM given:   Date Additional Medicare IM given:    Discharge Disposition:  HOME/SELF CARE  Per UR Regulation:    If discussed at Long Length of Stay Meetings, dates discussed:    Comments:

## 2011-10-10 NOTE — Discharge Instructions (Signed)
PLEASE REMEMBER TO BRING ALL OF YOUR MEDICATIONS TO EACH OF YOUR FOLLOW-UP OFFICE VISITS.  PLEASE ATTEND ALL SCHEDULED FOLLOW-UP APPOINTMENTS.   STOP TAKING FLECAINIDE FOR NOW. THIS MAY BE RESUMED ON YOUR NEXT FOLLOW-UP VISIT.

## 2011-10-10 NOTE — Discharge Summary (Signed)
Discharge Summary   Patient ID: Renee [name],  MRN: [id number], DOB/AGE: 66-Dec-1947 66 y.o.  Admit date: 10/07/2011 Discharge date: 10/10/2011  Discharge Diagnoses Principal Problem:  *Chest pain Active Problems:  ANXIETY  NSVT (nonsustained ventricular tachycardia)   Allergies Allergies  Allergen Reactions  . Moxifloxacin Other (See Comments)    REACTION: pt states she can't take Avelox...    Diagnostic Studies/Procedures  OVER-READ INTERPRETATION - CT CHEST - 10/10/11 The following report is an over-read performed by radiologist Dr.  Charline Bills, M.D. of [facility] on 10/10/2011  11:33:17. This over-read does not include interpretation of  cardiac or coronary anatomy or pathology. The CTA interpretation  by the cardiologist is attached.  Comparison: Chest radiographs dated 10/07/2011  Findings: Mild dependent atelectasis at the lung bases. No  suspicious pulmonary nodules. No pleural effusion or pneumothorax.  6 mm precarinal lymph node. No suspicious mediastinal  lymphadenopathy.  Visualized upper abdomen is unremarkable.  Mild degenerate changes of the visualized thoracic spine.  IMPRESSION:  No significant extracardiac findings are seen.  **END ADDENDUM** SIGNED BY: Charline Bills, M.D.       Study Result     Cardiac CT:  Indication: Chest Pain  Protocol: The patient was scanned on a Philips 256 scanner.  Gantry rotation speed was 270 msec. Collimation was .9mm. The  patient received 150 mg of iv amiodarone on the floor for PAC;s an  additional 5mg  of iv lopresser and nitro were given during the  scan. Average HR was 62 bpm. After initial AP and lateral scouts  and 3mm axial noncontrast was done through the heart for calcium  scoring using the Agatson method. The patient then received 80cc  of contrast with a prospective 100kV scan triggered in the  descending thoracic aorta at 111 HU's. Radiation dose was  minimized using idose 3  and reducing mA to 300. A lead shield was  used below the waist  Findings:  Calcium Score: 0  Coronary Arteries: Right dominant with no anomalies  LM- normal  LAD- normal diffusely small after second diagonal branch  D1:- normal  D2:- normal  Circumflex: normal  OM1: normal  OM2: normal  RCA: normal one large RV branch normal  PDA/PLA: small vessels and normal  Noncardiac: The lung and soft tissue windows showed no significant  abnormalities. See separate report from [facility]  Impression:  1) Calcium Score 0  2) Normal right dominant coronary arteries      History of Present Illness  66yo female with PMHx significant for AVNRT (s/p RFA 1993- Dr. Delena Serve, [facility]), history of prolonged QT, CAD (cath 2005- nonobstructive; LCx 30-40%; stress Myoview 03/13- low risk; 2D echo 04/13 with LVEF 60-65%), presyncope (believed to be secondary to NSVT; recently discharged on flecainide), HTN, GERD and chronic chest pain (non-ischemic) who was admitted to [facility] on 10/07/11 with chest pain.   She has had a long history of chronic chest pain believed to be non-ischemic. The patient had recently been discharged earlier on the day of admission for presyncope.  There were no events noted on her last admission on telemetry and was believed to be secondary to NSVT. She was started on flecainide and tolerated this well. Shortly after discharge, she visited her daughters and experienced severe chest pain radiating between her shoulder blades with associated SOB. She took NTG SL x 3 and was advised to return to the ED. On EKG review, there were no significant ST segment changes. QTc was  noted to be somewhat prolonged.   Hospital Course   She was subsequently admitted for ACS rule-out and further evaluation. Outpatient medications were continued with the exception of flecainide which was recently started. D-dimer was ordered and found to be WNL. Cardiac enzymes were cycled and found to  be WNL.   She effectively rule-out from an ACS standpoint. A coronary CT-A was scheduled for Monday. There was no evidence of arrhythmias on telemetry. Her chest pain improved throughout the admission. This morning, she was noted to have multiple PVCs. She was given an Amiodarone bolus, and underwent the CT which elicited the above details including a calcium score of 0 and normal coronary arteries.   Today, she is stable, in good condition and will be discharged home. She will follow-up with Dr. Riley Kill in 1-2 weeks. She will resume all outpatient medications aside from Flecainide. Dr. Ladona Ridgel indicated that even after this admission, this medication may be resumed; however, this will be decided on her next follow-up visit. This information, including activity restrictions has been clearly outlined in her discharge AVS.   Discharge Vitals:  Blood pressure 97/62, pulse 64, temperature 98.7 F (37.1 C), temperature source Oral, resp. rate 18, height 5\' 4"  (1.626 m), weight 67.45 kg (148 lb 11.2 oz), SpO2 97.00%.   Weight change: 0.318 kg (11.2 oz)  Labs: Recent Labs  [facility] 10/07/11 2202 10/07/11 1742   WBC 6.6 6.2   HGB 14.2 14.7   HCT 41.4 43.6   MCV 81.5 82.4   PLT 197 224   Recent Labs  Basename 10/07/11 1914   DDIMER 0.23    Lab 10/07/11 2202 10/07/11 1742 10/04/11 2020  NA -- 139 140  K -- 3.8 3.8  CL -- 100 102  CO2 -- 27 28  BUN -- 14 17  CREATININE 0.62 0.78 0.72  CALCIUM -- 10.3 9.8  PROT -- 7.1 --  BILITOT -- 0.4 --  ALKPHOS -- 71 --  ALT -- 14 --  AST -- 17 --  AMYLASE -- -- --  LIPASE -- -- --  GLUCOSE -- 131* 87   Recent Labs  Basename 10/08/11 0905 10/08/11 0415 10/07/11 2201   CKTOTAL 37 36 42   CKMB 1.7 1.8 1.8   CKMBINDEX -- -- --   TROPONINI <0.30 <0.30 <0.30   Disposition:  Discharge Orders    Future Appointments: Provider: Department: Dept Phone: Center:   10/19/2011 9:30 AM Herby Abraham, MD [facility] [phone] [facility]    11/02/2011 9:00 AM Marinus Maw, MD [facility] [phone] [facility]     Follow-up Information    Follow up with Shawnie Pons, MD on 10/19/2011. (At 9:30 AM for follow-up after this admission. )    Contact information:   [address] [place] [address] [phone]         Discharge Medications:  Medication List  As of 10/10/2011  2:52 PM   CONTINUE taking these medications         ALPRAZolam 0.5 MG tablet   Commonly known as: XANAX      aspirin EC 81 MG tablet      cholecalciferol 1000 UNITS tablet   Commonly known as: VITAMIN D      dicyclomine 20 MG tablet   Commonly known as: BENTYL      estradiol 1 MG tablet   Commonly known as: ESTRACE      medroxyPROGESTERone 2.5 MG tablet  Commonly known as: PROVERA      naproxen sodium 220 MG tablet   Commonly known as: ANAPROX      nitroGLYCERIN 0.4 MG SL tablet   Commonly known as: NITROSTAT         STOP taking these medications         flecainide 50 MG tablet           Outstanding Labs/Studies: None  Duration of Discharge Encounter: Greater than 30 minutes including physician time.  Signed, R. Hurman Horn, PA-C 10/10/2011, 2:52 PM

## 2011-10-10 NOTE — Progress Notes (Signed)
UR Completed. Simmons, Ashrita Chrismer F [phone]  

## 2011-10-10 NOTE — Progress Notes (Signed)
Patient ID: Renee [name], female   DOB: 08-20-1945, 66 y.o.   MRN: [id number]     SUBJECTIVE:  No further chest pain.. CTA ordered but has had frequent PAC;s.     PHYSICAL EXAM Filed Vitals:   10/08/11 2054 10/09/11 0422 10/09/11 1322 10/10/11 0417  BP: 122/74 109/68 115/74 105/67  Pulse: 74 61 74 67  Temp: 98 F (36.7 C) 98 F (36.7 C) 98.5 F (36.9 C) 98.3 F (36.8 C)  TempSrc: Oral Oral Oral Oral  Resp: 19 18 18 19   Height:      Weight:  67.132 kg (148 lb)  67.45 kg (148 lb 11.2 oz)  SpO2: 98% 97% 97% 96%   General:  No distress Lungs:  Clear Heart:  RRR Abdomen:  Positive bowel sounds, no rebound no guarding Extremities:  No edema.  LABS: Lab Results  Component Value Date   CKTOTAL 37 10/08/2011   CKMB 1.7 10/08/2011   TROPONINI <0.30 10/08/2011     Intake/Output Summary (Last 24 hours) at 10/10/11 0829 Last data filed at 10/10/11 0418  Gross per 24 hour  Intake    840 ml  Output   2700 ml  Net  -1860 ml    ASSESSMENT AND PLAN:  1. Chest pain:  Ruled out.  R forearm iv started not antecubital  I flushed it and it should be ok  Amiodarone bolus To help regularize rhythm  Will try to do CT at 10:00 discussed with patient willing to proceed    2. Recent NSVT: Flecainide on hold per Dr. Riley Kill.  No ventricular arrhythmias on monitor just PAC;s       Charlton Haws 10/10/2011 8:29 AM

## 2011-10-10 NOTE — Progress Notes (Signed)
Pt. Discharged 10/10/2011  5:20 PM Discharge instructions reviewed with patient/family. Patient/family verbalized understanding. All Rx's given. Questions answered as needed. Pt. Discharged to home with family/self.  Elease Hashimoto

## 2011-10-11 ENCOUNTER — Ambulatory Visit: Payer: Medicare Other | Admitting: Cardiology

## 2011-10-19 ENCOUNTER — Encounter: Payer: Self-pay | Admitting: Cardiology

## 2011-10-19 ENCOUNTER — Ambulatory Visit (INDEPENDENT_AMBULATORY_CARE_PROVIDER_SITE_OTHER): Payer: Medicare Other | Admitting: Cardiology

## 2011-10-19 DIAGNOSIS — R5383 Other fatigue: Secondary | ICD-10-CM

## 2011-10-19 DIAGNOSIS — R079 Chest pain, unspecified: Secondary | ICD-10-CM

## 2011-10-19 DIAGNOSIS — Q898 Other specified congenital malformations: Secondary | ICD-10-CM

## 2011-10-19 DIAGNOSIS — I2510 Atherosclerotic heart disease of native coronary artery without angina pectoris: Secondary | ICD-10-CM

## 2011-10-19 NOTE — Patient Instructions (Signed)
Your physician wants you to follow-up in: 6 WEEKS with Dr Riley Kill.  You will receive a reminder letter in the mail two months in advance. If you don't receive a letter, please call our office to schedule the follow-up appointment.  Your physician recommends that you continue on your current medications as directed. Please refer to the Current Medication list given to you today.

## 2011-10-19 NOTE — Discharge Summary (Signed)
The patient underwent CTA which was negative for CAD.  Her LV function is normal.  We will watch her conservatively, and see her back in the office.  I have reviewed as well with Dr. Ladona Ridgel.

## 2011-10-19 NOTE — Discharge Summary (Signed)
Patient seen and evaluated.  She is stable.  Seen by EP and placed on Flecainide.  She will be discharged with early post hospital follow up.  Discussed at length with the patient.
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
## 2011-10-23 NOTE — Assessment & Plan Note (Signed)
The long-term diagnosis of this is been a little bit unclear. None of her arrhythmia was polymorphic. Her current QT is normal and we will continue to follow this with serial EKGs.

## 2011-10-23 NOTE — Assessment & Plan Note (Signed)
Her main complaint is that of fatigue. She does not have evidence at this point of significant coronary artery disease. She did have some slow wide-complex rhythm, but overall left ventricular function is normal. We had started her on flecainide. Dr. Ladona Ridgel still felt this was a viable option given the lack of coronary artery disease if it becomes necessary, but at the present time I would lean towards conservative course of management. She has had multiple lab studies, and reviewed all these in detail today. We'll continue to follow up.

## 2011-10-28 ENCOUNTER — Other Ambulatory Visit: Payer: Self-pay

## 2011-10-28 DIAGNOSIS — I472 Ventricular tachycardia: Secondary | ICD-10-CM

## 2011-10-28 DIAGNOSIS — R5382 Chronic fatigue, unspecified: Secondary | ICD-10-CM

## 2011-11-02 ENCOUNTER — Ambulatory Visit (INDEPENDENT_AMBULATORY_CARE_PROVIDER_SITE_OTHER): Payer: Medicare Other | Admitting: Internal Medicine

## 2011-11-02 ENCOUNTER — Encounter: Payer: Self-pay | Admitting: Internal Medicine

## 2011-11-02 VITALS — BP 128/90 | HR 64 | Ht 64.0 in | Wt 151.0 lb

## 2011-11-02 DIAGNOSIS — Q898 Other specified congenital malformations: Secondary | ICD-10-CM

## 2011-11-02 DIAGNOSIS — I472 Ventricular tachycardia: Secondary | ICD-10-CM

## 2011-11-02 DIAGNOSIS — I10 Essential (primary) hypertension: Secondary | ICD-10-CM

## 2011-11-02 NOTE — Assessment & Plan Note (Signed)
I discussed the benign nature of this problem with the patient. I have offered her a retrial of flecainide to help control her symptoms. After an exhaustive discussion about the pros and cons of restarting flecainide, the patient would like to hold off on any antiarrhythmic therapy at this time.
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
## 2011-11-02 NOTE — Patient Instructions (Signed)
Your physician recommends that you schedule a follow-up appointment in: as needed  

## 2011-11-02 NOTE — Assessment & Plan Note (Signed)
Review of her prior ECGs demonstrates minimal prolongation of the QT interval. I would recommend avoiding medications that markedly prolonged QT interval.

## 2011-11-02 NOTE — Assessment & Plan Note (Signed)
Her blood pressure is well controlled. She will continue her current medical therapy and maintain a low-sodium diet. 

## 2011-11-08 ENCOUNTER — Ambulatory Visit: Payer: Medicare Other

## 2011-11-10 ENCOUNTER — Telehealth: Payer: Self-pay | Admitting: Cardiology

## 2011-11-10 NOTE — Telephone Encounter (Signed)
New Problem:    Patient had a spell where she felt like she had no air to breathe around 1:20pm and her blood pressure and heart rate drops. Please call back.

## 2011-11-10 NOTE — Telephone Encounter (Signed)
I spoke with the pt and today she took her mother's car to get inspected at 1:00 and she began to not feel well.  The pt said she really felt bad while waiting for car and when she got home she checked her BP 100/67, pulse 61 and had SOB and weakness.  The pt laid down to rest and got up at 4:15.  BP 126/74, pulse 73 and the pt did feel a little better but still has SOB when she exerts herself.  During this episode the pt felt like she should have gone to the ER but she did not want to go back to the hospital. The pt said she did not notice any palpitations with her event. I made the pt aware that it is difficult to treat her symptoms because we do not have any objective data from this episode.  If this episode occurs again the pt will contact EMS and have them do a rhythm strip.  I also spoke with the pt about her anxiety possibly causing some SOB.  The pt will try her PRN Xanax with future events. I will forward this message to Dr Riley Kill for further review.

## 2011-11-15 ENCOUNTER — Ambulatory Visit
Admission: RE | Admit: 2011-11-15 | Discharge: 2011-11-15 | Disposition: A | Discharge status: Home / Self Care | Payer: Medicare Other | Source: Ambulatory Visit | Attending: Pulmonary Disease | Admitting: Pulmonary Disease

## 2011-11-15 DIAGNOSIS — Z1231 Encounter for screening mammogram for malignant neoplasm of breast: Secondary | ICD-10-CM

## 2011-11-22 ENCOUNTER — Ambulatory Visit (HOSPITAL_BASED_OUTPATIENT_CLINIC_OR_DEPARTMENT_OTHER): Payer: Medicare Other | Attending: Cardiology

## 2011-11-22 VITALS — Ht 64.0 in | Wt 151.0 lb

## 2011-11-22 DIAGNOSIS — G4710 Hypersomnia, unspecified: Secondary | ICD-10-CM | POA: Insufficient documentation

## 2011-11-22 DIAGNOSIS — R5382 Chronic fatigue, unspecified: Secondary | ICD-10-CM

## 2011-11-22 DIAGNOSIS — I472 Ventricular tachycardia: Secondary | ICD-10-CM

## 2011-11-22 DIAGNOSIS — G4733 Obstructive sleep apnea (adult) (pediatric): Secondary | ICD-10-CM

## 2011-11-22 DIAGNOSIS — G4730 Sleep apnea, unspecified: Secondary | ICD-10-CM | POA: Insufficient documentation

## 2011-11-29 DIAGNOSIS — G4730 Sleep apnea, unspecified: Secondary | ICD-10-CM

## 2011-11-29 DIAGNOSIS — G4710 Hypersomnia, unspecified: Secondary | ICD-10-CM

## 2011-11-29 NOTE — Procedures (Signed)
NAME:  Renee [name], Renee [name]               ACCOUNT NO.:  [id number]  MEDICAL RECORD NO.:  [id number]          PATIENT TYPE:  OUT  LOCATION:  SLEEP CENTER                 FACILITY[facility]  PHYSICIAN:  Barbaraann Share, MD,FCCPDATE OF BIRTH:  1945-07-10  DATE OF STUDY:  11/22/2011                           NOCTURNAL POLYSOMNOGRAM  REFERRING PHYSICIAN:  Arturo Morton. Riley Kill, MD, [facility]  INDICATION FOR STUDY:  Hypersomnia with sleep apnea.  EPWORTH SLEEPINESS SCORE:  5.  MEDICATIONS:  SLEEP ARCHITECTURE:  The patient had a total sleep time of 336 minutes with no slow-wave sleep and only 88 minutes of REM.  Sleep onset latency was 14.5 minutes which is normal and REM onset was also normal at 80 minutes.  Sleep efficiency was mildly reduced at 83%.  RESPIRATORY DATA:  The patient was found to have no apneas and only 3 obstructive hypopneas, giving her an apnea-hypopnea index of 0.5 events per hour.  There was no snoring noted, and the patient did achieve REM, and also had supine sleep.  She did not meet split night protocol secondary to the small numbers of events.  OXYGEN DATA:  There was O2 desaturation as low as 90% that was transient and infrequent.  CARDIAC DATA:  Frequent PVCs noted throughout.  MOVEMENT-PARASOMNIA:  The patient had no leg jerks or other abnormal behaviors seen.  IMPRESSIONS-RECOMMENDATIONS: 1. Small numbers of obstructive events, which do not meet the AHI     criteria for the obstructive sleep apnea syndrome. 2. Frequent PVCs noted throughout. 3. The patient had no significant leg jerks, abnormal behaviors, or     any other explanation for her history of significant sleep     disruption.    Barbaraann Share, MD,FCCP Diplomate, American Board of Sleep Medicine   [facility]  D:  11/29/2011 08:22:57  T:  11/29/2011 09:35:16  Job:  [id number]

## 2011-11-30 ENCOUNTER — Ambulatory Visit (INDEPENDENT_AMBULATORY_CARE_PROVIDER_SITE_OTHER): Payer: Medicare Other | Admitting: Cardiology

## 2011-11-30 ENCOUNTER — Encounter: Payer: Self-pay | Admitting: Cardiology

## 2011-11-30 DIAGNOSIS — I10 Essential (primary) hypertension: Secondary | ICD-10-CM

## 2011-11-30 DIAGNOSIS — R5381 Other malaise: Secondary | ICD-10-CM

## 2011-11-30 DIAGNOSIS — Q898 Other specified congenital malformations: Secondary | ICD-10-CM

## 2011-11-30 DIAGNOSIS — I2510 Atherosclerotic heart disease of native coronary artery without angina pectoris: Secondary | ICD-10-CM

## 2011-11-30 DIAGNOSIS — I498 Other specified cardiac arrhythmias: Secondary | ICD-10-CM

## 2011-11-30 DIAGNOSIS — E041 Nontoxic single thyroid nodule: Secondary | ICD-10-CM

## 2011-11-30 DIAGNOSIS — R55 Syncope and collapse: Secondary | ICD-10-CM

## 2011-11-30 DIAGNOSIS — R079 Chest pain, unspecified: Secondary | ICD-10-CM

## 2011-11-30 DIAGNOSIS — F411 Generalized anxiety disorder: Secondary | ICD-10-CM

## 2011-11-30 DIAGNOSIS — R5383 Other fatigue: Secondary | ICD-10-CM

## 2011-11-30 NOTE — Patient Instructions (Addendum)
Your physician recommends that you schedule a follow-up appointment in: 2 WEEKS with Dr Riley Kill  Your physician recommends that you have lab work today: [facility]  Your physician has recommended you make the following change in your medication: STOP Maxzide

## 2011-12-01 ENCOUNTER — Telehealth: Payer: Self-pay | Admitting: Pulmonary Disease

## 2011-12-01 LAB — BASIC METABOLIC PANEL
BUN: 13 mg/dL (ref 6–23)
Creatinine, Ser: 0.7 mg/dL (ref 0.4–1.2)
GFR: 91.93 mL/min (ref >60.00)
Potassium: 3.9 mEq/L (ref 3.5–5.1)
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
## 2011-12-01 NOTE — Assessment & Plan Note (Signed)
There are no orthostatic changes today, but I will hold diuretics for a few days to see if she feels any better.  See her back in early follow up.

## 2011-12-01 NOTE — Assessment & Plan Note (Signed)
She has not had any recurrence of this recently.  CTA was completely normal with calcium score of 0.

## 2011-12-01 NOTE — Assessment & Plan Note (Signed)
She is worried about "cold nodules".  Will have her see Dr. Kriste Basque.

## 2011-12-01 NOTE — Assessment & Plan Note (Signed)
I am not sure the etiology of this. She has had a lyme titer in the past.  Recent sed rate, TSH and a number of other items have been normal.  I will discuss her with Dr. Kriste Basque and I have asked her to make an appointment with him as she would like a comprehensive internal medicine eval.

## 2011-12-01 NOTE — Assessment & Plan Note (Signed)
Shared with me recently that her mother is tough on her related to her dementia.  The patient seems to understand and is handling it, does not feel current symptoms are related.

## 2011-12-01 NOTE — Assessment & Plan Note (Signed)
Discussed with patient in detail.  Also discussed with Dr. Ladona Ridgel. He said he would be comfortable with using Flecainide.  Unlikely the source of symptoms.  During spells of weakness, she does not necessarily feel any evidence of rapid pulse.  I also reviewed with Dr. Ladona Ridgel the potential for consideration of a loop recorder so we can establish without question the lack of relationship to any obvious cardiac event.  He will see in clinic to discuss and I will arrange that through our staff.

## 2011-12-01 NOTE — Telephone Encounter (Signed)
Per SN---we can do a work in appt on Monday at 10.  thanks

## 2011-12-01 NOTE — Telephone Encounter (Signed)
ATC NA WCB 

## 2011-12-01 NOTE — Assessment & Plan Note (Signed)
As noted, will have her see EP --?loop recorder.

## 2011-12-01 NOTE — Telephone Encounter (Signed)
I spoke with pt and she states that Dr. Riley Kill is wanting pt to come in and see SN and discuss her extreme fatigue and f/u on the "nodules in her throat". Pt is requesting to be worked in soon. Please advise SN thanks

## 2011-12-01 NOTE — Telephone Encounter (Signed)
Per SN--this sounds exactly like her FM.  She should see her rheumatologist  Or follow up with rheumatology in [place].  thanks

## 2011-12-01 NOTE — Telephone Encounter (Signed)
I spoke with pt and is scheduled to come in at that time

## 2011-12-01 NOTE — Telephone Encounter (Signed)
Returning call can be reached at [phone].Renee [name]

## 2011-12-01 NOTE — Telephone Encounter (Signed)
Pt disagrees and says this is not her FM, she lives in her body and knows that is not the issue.  She was told to follow-up with SN ASAP. She is due back for follow-up with Dr. Riley Kill in 2 weeks and says SN may need to call and speak with Dr. Riley Kill and discuss her condition. Pt will not call rheumatologist. Pls advise.

## 2011-12-01 NOTE — Assessment & Plan Note (Signed)
Prior history.  No clear evidence of sustained episodes. No clear cut episodes.

## 2011-12-01 NOTE — Telephone Encounter (Signed)
LMTCB

## 2011-12-05 ENCOUNTER — Other Ambulatory Visit (INDEPENDENT_AMBULATORY_CARE_PROVIDER_SITE_OTHER): Payer: Medicare Other

## 2011-12-05 ENCOUNTER — Encounter: Payer: Self-pay | Admitting: Pulmonary Disease

## 2011-12-05 ENCOUNTER — Ambulatory Visit (INDEPENDENT_AMBULATORY_CARE_PROVIDER_SITE_OTHER): Payer: Medicare Other | Admitting: Pulmonary Disease

## 2011-12-05 VITALS — BP 126/70 | HR 60 | Temp 97.1°F | Ht 64.0 in | Wt 151.8 lb

## 2011-12-05 DIAGNOSIS — K219 Gastro-esophageal reflux disease without esophagitis: Secondary | ICD-10-CM

## 2011-12-05 DIAGNOSIS — M1990 Unspecified osteoarthritis, unspecified site: Secondary | ICD-10-CM

## 2011-12-05 DIAGNOSIS — E7800 Pure hypercholesterolemia, unspecified: Secondary | ICD-10-CM

## 2011-12-05 DIAGNOSIS — E041 Nontoxic single thyroid nodule: Secondary | ICD-10-CM

## 2011-12-05 DIAGNOSIS — R5381 Other malaise: Secondary | ICD-10-CM

## 2011-12-05 DIAGNOSIS — K5730 Diverticulosis of large intestine without perforation or abscess without bleeding: Secondary | ICD-10-CM

## 2011-12-05 DIAGNOSIS — I2510 Atherosclerotic heart disease of native coronary artery without angina pectoris: Secondary | ICD-10-CM

## 2011-12-05 DIAGNOSIS — K589 Irritable bowel syndrome without diarrhea: Secondary | ICD-10-CM

## 2011-12-05 DIAGNOSIS — I10 Essential (primary) hypertension: Secondary | ICD-10-CM

## 2011-12-05 DIAGNOSIS — I498 Other specified cardiac arrhythmias: Secondary | ICD-10-CM

## 2011-12-05 DIAGNOSIS — R55 Syncope and collapse: Secondary | ICD-10-CM

## 2011-12-05 DIAGNOSIS — R5383 Other fatigue: Secondary | ICD-10-CM

## 2011-12-05 DIAGNOSIS — F411 Generalized anxiety disorder: Secondary | ICD-10-CM

## 2011-12-05 DIAGNOSIS — IMO0001 Reserved for inherently not codable concepts without codable children: Secondary | ICD-10-CM

## 2011-12-05 DIAGNOSIS — Z79899 Other long term (current) drug therapy: Secondary | ICD-10-CM

## 2011-12-05 DIAGNOSIS — K5909 Other constipation: Secondary | ICD-10-CM

## 2011-12-05 LAB — CBC WITH DIFFERENTIAL/PLATELET
Basophils Absolute: 0 10*3/uL (ref 0.0–0.1)
Eosinophils Absolute: 0.1 10*3/uL (ref 0.0–0.7)
Lymphocytes Relative: 33.8 % (ref 12.0–46.0)
MCHC: 33 g/dL (ref 30.0–36.0)
Neutrophils Relative %: 60 % (ref 43.0–77.0)
RDW: 12.9 % (ref 11.5–14.6)

## 2011-12-05 LAB — HEPATIC FUNCTION PANEL
ALT: 17 U/L (ref 0–35)
Alkaline Phosphatase: 59 U/L (ref 39–117)
Bilirubin, Direct: 0.2 mg/dL (ref 0.0–0.3)
Total Protein: 7.1 g/dL (ref 6.0–8.3)

## 2011-12-05 LAB — HEMOGLOBIN A1C: Hgb A1c MFr Bld: 5.3 % (ref 4.6–6.5)

## 2011-12-05 LAB — LIPID PANEL
Cholesterol: 129 mg/dL (ref 0–200)
VLDL: 20 mg/dL (ref 0.0–40.0)

## 2011-12-05 LAB — SEDIMENTATION RATE: Sed Rate: 5 mm/hr (ref 0–22)

## 2011-12-05 MED ORDER — ALPRAZOLAM 0.5 MG PO TABS: ORAL_TABLET | ORAL | Status: DC

## 2011-12-05 MED ORDER — ZOLPIDEM TARTRATE 10 MG PO TABS: ORAL_TABLET | ORAL | Status: DC
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
## 2011-12-05 NOTE — Patient Instructions (Addendum)
Today we updated your med list in our EPIC system...    Continue your current medications the same...  We wrote a new prescription for AMBIEN 10mg - 1/2 to 1 tab at bedtime for sleep...  Today we did your follow up complete blood work panel...    We will call you w/ the results when avail...  We will arrange for a follow up Endocrine appt w/ DrKerr...  Call for any questions.Marland KitchenMarland Kitchen

## 2011-12-07 ENCOUNTER — Telehealth: Payer: Self-pay | Admitting: Pulmonary Disease

## 2011-12-07 NOTE — Telephone Encounter (Signed)
Per 6.24.13 lab results:  Result Notes     Notes Recorded by Michele Mcalpine, MD on 12/06/2011 at 7:17 AM Please notify patient>  FLP is WNL on diet alone... Chems, CBC, Thyroid> all WNL... Normal A1c indicates no DM... Normal Sed indicates no systemic inflammation...      Called spoke with patient, notified her of her lab results as stated by SN.  Pt verbalized her understanding and requested a copy be mailed to her.  Address verified.  Copy of labs placed in the mail.  Pt to call with any questions/concerns.  Nothing further needed at this time; will sign off.

## 2011-12-13 ENCOUNTER — Ambulatory Visit (INDEPENDENT_AMBULATORY_CARE_PROVIDER_SITE_OTHER): Payer: Medicare Other | Admitting: Cardiology

## 2011-12-13 ENCOUNTER — Encounter: Payer: Self-pay | Admitting: Cardiology

## 2011-12-13 VITALS — BP 148/92 | HR 72 | Ht 64.0 in | Wt 148.0 lb

## 2011-12-13 DIAGNOSIS — I472 Ventricular tachycardia: Secondary | ICD-10-CM

## 2011-12-13 DIAGNOSIS — R5381 Other malaise: Secondary | ICD-10-CM

## 2011-12-13 DIAGNOSIS — I2510 Atherosclerotic heart disease of native coronary artery without angina pectoris: Secondary | ICD-10-CM

## 2011-12-13 DIAGNOSIS — I10 Essential (primary) hypertension: Secondary | ICD-10-CM

## 2011-12-13 NOTE — Patient Instructions (Addendum)
Your physician recommends that you schedule a follow-up appointment in: 2 MONTHS  You can take your fluid medication two days a week if your Diastolic BP is greater than 90 on a consistent basis (Maxzide).
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
## 2011-12-18 NOTE — Assessment & Plan Note (Signed)
Still not quite sure of the cause of all of this.  Be inclined to continue to monitor.  She has a lot going on at home, but does not think that this accounts for all of her findings.  When she feels bad, she feels really bad.  Will continue to monitor her.

## 2011-12-18 NOTE — Assessment & Plan Note (Signed)
Based on her recent CT will continue to monitor.  With her CTA, would not be in favor of cath.

## 2011-12-18 NOTE — Assessment & Plan Note (Signed)
Probably start back on diuretics a couple of times per week.

## 2011-12-18 NOTE — Assessment & Plan Note (Signed)
See Dr. Lubertha Basque notes.  Would use mainly for symptomatic change.

## 2011-12-21 ENCOUNTER — Ambulatory Visit
Admission: RE | Admit: 2011-12-21 | Discharge: 2011-12-21 | Disposition: A | Discharge status: Home / Self Care | Payer: Medicare Other | Source: Ambulatory Visit | Attending: Internal Medicine | Admitting: Internal Medicine

## 2011-12-21 ENCOUNTER — Other Ambulatory Visit: Payer: Self-pay | Admitting: Internal Medicine

## 2011-12-21 DIAGNOSIS — E049 Nontoxic goiter, unspecified: Secondary | ICD-10-CM

## 2011-12-26 ENCOUNTER — Encounter (INDEPENDENT_AMBULATORY_CARE_PROVIDER_SITE_OTHER): Payer: Self-pay

## 2012-02-09 ENCOUNTER — Telehealth: Payer: Self-pay | Admitting: Cardiology

## 2012-02-09 NOTE — Telephone Encounter (Signed)
Pt's cxl appt 9-13 and rs to 9-18, didn't feel good yesterday. BP high 154/95 this am heart rate 68

## 2012-02-09 NOTE — Telephone Encounter (Signed)
Spoke with pt who states she is taking care of her mother, who has alzheimer's disease, and she is having a hard time dealing with that and her BP is going up and she wanted dr Horton Marshall states dr Riley Kill wanted her to take an exrta 1/2 tab of maxide when diastolic above 90--pt states she is doing this and wanted dr Riley Kill to know

## 2012-02-24 ENCOUNTER — Ambulatory Visit: Payer: Medicare Other | Admitting: Cardiology

## 2012-02-29 ENCOUNTER — Ambulatory Visit (INDEPENDENT_AMBULATORY_CARE_PROVIDER_SITE_OTHER): Payer: Medicare Other | Admitting: Cardiology

## 2012-02-29 ENCOUNTER — Telehealth: Payer: Self-pay | Admitting: Pulmonary Disease

## 2012-02-29 ENCOUNTER — Encounter: Payer: Self-pay | Admitting: Cardiology

## 2012-02-29 VITALS — BP 132/82 | HR 65 | Ht 64.0 in | Wt 157.0 lb

## 2012-02-29 DIAGNOSIS — I10 Essential (primary) hypertension: Secondary | ICD-10-CM

## 2012-02-29 DIAGNOSIS — R5381 Other malaise: Secondary | ICD-10-CM

## 2012-02-29 DIAGNOSIS — I2510 Atherosclerotic heart disease of native coronary artery without angina pectoris: Secondary | ICD-10-CM

## 2012-02-29 DIAGNOSIS — Q898 Other specified congenital malformations: Secondary | ICD-10-CM

## 2012-02-29 LAB — BASIC METABOLIC PANEL
Calcium: 9 mg/dL (ref 8.4–10.5)
Creatinine, Ser: 0.7 mg/dL (ref 0.4–1.2)

## 2012-02-29 LAB — CORTISOL: Cortisol, Plasma: 8.8 ug/dL

## 2012-02-29 NOTE — Patient Instructions (Signed)
Your physician recommends that you have lab work today: BMP and Serum Cortisol  Your physician recommends that you schedule a follow-up appointment in: 3 MONTHS with Dr Riley Kill  Your physician recommends that you continue on your current medications as directed. Please refer to the Current Medication list given to you today.

## 2012-02-29 NOTE — Telephone Encounter (Signed)
lmomtcb x1 

## 2012-03-01 NOTE — Telephone Encounter (Signed)
lmomtcb x 2  

## 2012-03-02 ENCOUNTER — Telehealth: Payer: Self-pay | Admitting: Cardiology

## 2012-03-02 DIAGNOSIS — E876 Hypokalemia: Secondary | ICD-10-CM

## 2012-03-02 MED ORDER — POTASSIUM CHLORIDE ER 10 MEQ PO TBCR: 10.0000 meq | EXTENDED_RELEASE_TABLET | Freq: Every day | ORAL | Status: DC

## 2012-03-02 NOTE — Telephone Encounter (Signed)
Per SN---we last saw the pt on 11/2011.   SN has reviewed Dr. Riley Kill note and lab work and her chemistry were normal but her potassium was 3.6 in the low end of normal so SN agrees with Dr. Louretta Shorten recs to start pt on k10  1 daily.  No need for ROV her now but keep her follow up with endocrine with Dr. Sharl Ma.  thanks

## 2012-03-02 NOTE — Telephone Encounter (Signed)
Spoke with patient-she is aware that SN agrees to have patient start K+ 10 meq once daily with 6 refills. Requests that Rx be sent to [facility], [place]. Rx sent.

## 2012-03-02 NOTE — Telephone Encounter (Signed)
Pt had blood work done by dr Riley Kill, he told her she needed more blood work done that we couldn't do, that he would send an order to dr Kirstie Mirza office to do, dr Kirstie Mirza office just called her and told her they were calling in a med due to her potassium level and she tried telling them that's not what dr Riley Kill wanted, pt wants order sent back to dr Kirstie Mirza office to do blood work and call when done pt's [phone] ok to leave a message

## 2012-03-02 NOTE — Telephone Encounter (Signed)
Renee [name] calling wanting to know if dr Riley Kill sent order for lab work to dr nadel--pt upset as she called dr Jodelle Green office and all they told her was to take K+  And they did not know about any labs that were to be drawn at their office--after speaking to dr Riley Kill i learned that he went ahead and drew a cortisol level here at our office, along with bmet, which showed borderline K+ level , so dr Kriste Basque called her in some K+--I called pt and explained situation and that dr Riley Kill would be calling her this weekend and the lab that was to be  drawn at dr nadels office was actually drawn here and the level wsa within normal limits--pt agrees with all above and seems to calmer with this call

## 2012-03-02 NOTE — Telephone Encounter (Signed)
Called, spoke with pt who states Dr. Riley Kill wanted Dr. Kriste Basque to order some specific labs for her.  She is unsure what this is, and states that Dr. Riley Kill was going to send msg to Dr. Kriste Basque regarding this.  Dr. Kriste Basque, pls advise.  Thank you.

## 2012-03-02 NOTE — Telephone Encounter (Signed)
Per Leigh, ok to send k rx #30 x 6  lmomtcb to inform pt of below and see which pharm she would like rx sent to.
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
## 2012-03-04 NOTE — Assessment & Plan Note (Signed)
It is not clear to me as to the major source of this. She clearly has quite a bit of stress related to her home situation. He with her fatigue, she has had what seems like a fairly regular pulse. She has a had a rather extensive workup in the past few months, and I would continue to recommend observation. We will get a serum cortisol level to make sure that she is not adrenally  Insufficient.  I will continue to encourage her, and continue to see her back on a regular basis.

## 2012-03-04 NOTE — Assessment & Plan Note (Signed)
Has seen Dr. Graciela Husbands.  Not clearcut, only mild, and doubtful as a source of arrhythmia.

## 2012-03-04 NOTE — Assessment & Plan Note (Signed)
Has improved on a half tablet per day.  Will need to watch electrolytes.

## 2012-03-05 NOTE — Telephone Encounter (Signed)
Pt rtn your call and I let her know she needed to come for labs in Nov and you mailed and appt card and you would call if you had any other information for her

## 2012-03-05 NOTE — Telephone Encounter (Signed)
I spoke with the Renee [name] and she wanted to make Dr Riley Kill aware that after her appointment she had an extreme episode of fatigue and shaking.  The Renee [name] ate a cup of jello and drank a large glass of tea.  The Renee [name] went to her brother's house and checked her glucose and it was 98.  I made the Renee [name] aware that her symptoms were related to hypoglycemia.  I instructed the Renee [name] that she needs to contact her PCP about evaluating her glucose levels.  I did advise the Renee [name] to eat small frequent meals and include protein in her meals. She agreed with plan.

## 2012-03-05 NOTE — Telephone Encounter (Signed)
I called and spoke with patient in detail.  As I had asked, she checked her pulse and BP and they were normal during these spells of profound weakness.  She has a reasonable cortisol level.  This mostly seems not cardiac related.  We will try to get her in to see Dr. Kriste Basque to see if other workup, such as endocrine, is worth considering.  Notably, after a lot of glucose intake, her sugar, measured on her brother's machine, was 98.  Will call [facility] office tomorrow to arrange.

## 2012-03-05 NOTE — Telephone Encounter (Signed)
I left a message on the pt's voicemail to make her aware that she is due for a repeat BMP on 04/17/12. I also mailed an appointment card to the pt's home.

## 2012-03-06 NOTE — Telephone Encounter (Signed)
The pt has been scheduled to see Dr Kriste Basque on 03/07/12 at 11:00.  Pt aware of appointment. Vitals today per pt 138/78, pulse 67.

## 2012-03-07 ENCOUNTER — Encounter: Payer: Self-pay | Admitting: Pulmonary Disease

## 2012-03-07 ENCOUNTER — Ambulatory Visit (INDEPENDENT_AMBULATORY_CARE_PROVIDER_SITE_OTHER): Payer: Medicare Other | Admitting: Pulmonary Disease

## 2012-03-07 VITALS — BP 140/80 | HR 60 | Temp 98.0°F | Ht 64.0 in | Wt 155.6 lb

## 2012-03-07 DIAGNOSIS — G894 Chronic pain syndrome: Secondary | ICD-10-CM

## 2012-03-07 DIAGNOSIS — E041 Nontoxic single thyroid nodule: Secondary | ICD-10-CM

## 2012-03-07 DIAGNOSIS — R55 Syncope and collapse: Secondary | ICD-10-CM

## 2012-03-07 DIAGNOSIS — M545 Low back pain: Secondary | ICD-10-CM

## 2012-03-07 DIAGNOSIS — IMO0001 Reserved for inherently not codable concepts without codable children: Secondary | ICD-10-CM

## 2012-03-07 DIAGNOSIS — I498 Other specified cardiac arrhythmias: Secondary | ICD-10-CM

## 2012-03-07 DIAGNOSIS — I472 Ventricular tachycardia: Secondary | ICD-10-CM

## 2012-03-07 DIAGNOSIS — Z8669 Personal history of other diseases of the nervous system and sense organs: Secondary | ICD-10-CM

## 2012-03-07 DIAGNOSIS — M1990 Unspecified osteoarthritis, unspecified site: Secondary | ICD-10-CM

## 2012-03-07 DIAGNOSIS — F411 Generalized anxiety disorder: Secondary | ICD-10-CM

## 2012-03-07 DIAGNOSIS — R569 Unspecified convulsions: Secondary | ICD-10-CM

## 2012-03-07 DIAGNOSIS — I2510 Atherosclerotic heart disease of native coronary artery without angina pectoris: Secondary | ICD-10-CM

## 2012-03-07 DIAGNOSIS — I10 Essential (primary) hypertension: Secondary | ICD-10-CM
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
## 2012-03-07 NOTE — Patient Instructions (Addendum)
Today we updated your med list in our EPIC system...    Continue your current medications the same...  We will endeavor to get you an appt w/ DrKerr for an endocrine evaluation of your spells...  You should follow up w/ DrHawkes (Rheum) at your earliest convenience...  Stay in close touch w/ DrStuckey regarding your cardiac symptoms.Marland KitchenMarland Kitchen

## 2012-03-08 ENCOUNTER — Encounter (INDEPENDENT_AMBULATORY_CARE_PROVIDER_SITE_OTHER): Payer: Self-pay | Admitting: Surgery

## 2012-03-08 ENCOUNTER — Ambulatory Visit (INDEPENDENT_AMBULATORY_CARE_PROVIDER_SITE_OTHER): Payer: Medicare Other | Admitting: Surgery

## 2012-03-08 VITALS — BP 138/74 | HR 72 | Temp 97.8°F | Resp 14 | Ht 64.0 in | Wt 151.1 lb

## 2012-03-08 DIAGNOSIS — E041 Nontoxic single thyroid nodule: Secondary | ICD-10-CM
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
## 2012-03-13 ENCOUNTER — Ambulatory Visit
Admission: RE | Admit: 2012-03-13 | Discharge: 2012-03-13 | Disposition: A | Discharge status: Home / Self Care | Payer: Medicare Other | Source: Ambulatory Visit | Attending: Surgery | Admitting: Surgery

## 2012-03-13 ENCOUNTER — Other Ambulatory Visit (HOSPITAL_COMMUNITY)
Admission: RE | Admit: 2012-03-13 | Discharge: 2012-03-13 | Disposition: A | Discharge status: Home / Self Care | Payer: Medicare Other | Source: Ambulatory Visit | Attending: Interventional Radiology | Admitting: Interventional Radiology

## 2012-03-13 DIAGNOSIS — E049 Nontoxic goiter, unspecified: Secondary | ICD-10-CM | POA: Insufficient documentation

## 2012-04-04 ENCOUNTER — Encounter (INDEPENDENT_AMBULATORY_CARE_PROVIDER_SITE_OTHER): Payer: Self-pay | Admitting: Surgery

## 2012-04-04 ENCOUNTER — Ambulatory Visit (INDEPENDENT_AMBULATORY_CARE_PROVIDER_SITE_OTHER): Payer: Medicare Other | Admitting: Surgery

## 2012-04-04 VITALS — BP 128/72 | HR 64 | Temp 97.4°F | Resp 16 | Ht 64.0 in | Wt 156.0 lb

## 2012-04-04 DIAGNOSIS — E041 Nontoxic single thyroid nodule: Secondary | ICD-10-CM
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
## 2012-04-09 ENCOUNTER — Other Ambulatory Visit: Payer: Self-pay | Admitting: Dermatology

## 2012-04-17 ENCOUNTER — Other Ambulatory Visit: Payer: Medicare Other

## 2012-04-18 ENCOUNTER — Other Ambulatory Visit (INDEPENDENT_AMBULATORY_CARE_PROVIDER_SITE_OTHER): Payer: Medicare Other

## 2012-04-18 ENCOUNTER — Other Ambulatory Visit: Payer: Self-pay | Admitting: Obstetrics and Gynecology

## 2012-04-18 DIAGNOSIS — E876 Hypokalemia: Secondary | ICD-10-CM

## 2012-04-18 LAB — BASIC METABOLIC PANEL
Calcium: 9.6 mg/dL (ref 8.4–10.5)
Creatinine, Ser: 0.7 mg/dL (ref 0.4–1.2)
GFR: 87.36 mL/min (ref >60.00)

## 2012-04-19 ENCOUNTER — Other Ambulatory Visit: Payer: Self-pay

## 2012-05-28 ENCOUNTER — Telehealth: Payer: Self-pay | Admitting: Pulmonary Disease

## 2012-05-28 NOTE — Telephone Encounter (Signed)
Spoke with pt She states "still having spells" off and on when she feels blood glucose level drops She states that she saw a doc who ordered a test where her blood glucose was checked every 30 minutes, and the test did not show any abnormality "because I was not having a spell that da" She is asking for rx for blood glucose meter I advised that she can get this otc at the pharm, but she states needs rx. Please advise thanks

## 2012-05-28 NOTE — Telephone Encounter (Signed)
Per SN---ok to send in meter for the pt of her choice to check BS daily.  Called and spoke with pt and she is aware of this and will call back tomorrow with the name of the meter that she would like called to her pharmacy.

## 2012-05-29 MED ORDER — ONETOUCH ULTRA SYSTEM W/DEVICE KIT: 1.0000 | PACK | Freq: Once | Status: DC

## 2012-05-29 MED ORDER — GLUCOSE BLOOD VI STRP: ORAL_STRIP | Status: DC

## 2012-05-29 MED ORDER — ONETOUCH ULTRASOFT LANCETS MISC: Status: DC

## 2012-05-29 NOTE — Telephone Encounter (Signed)
Pt is requesting to get the one touch ultra.  [place]enee [name]

## 2012-05-29 NOTE — Telephone Encounter (Signed)
Pt aware this has been sent to her pharmacy.

## 2012-05-30 ENCOUNTER — Ambulatory Visit (INDEPENDENT_AMBULATORY_CARE_PROVIDER_SITE_OTHER): Payer: Medicare Other | Admitting: Cardiology

## 2012-05-30 ENCOUNTER — Encounter: Payer: Self-pay | Admitting: Cardiology

## 2012-05-30 VITALS — BP 130/72 | HR 60 | Ht 64.0 in | Wt 155.6 lb

## 2012-05-30 DIAGNOSIS — E162 Hypoglycemia, unspecified: Secondary | ICD-10-CM

## 2012-05-30 DIAGNOSIS — Q898 Other specified congenital malformations: Secondary | ICD-10-CM

## 2012-05-30 DIAGNOSIS — Q8989 Other specified congenital malformations: Secondary | ICD-10-CM

## 2012-05-30 DIAGNOSIS — I2510 Atherosclerotic heart disease of native coronary artery without angina pectoris: Secondary | ICD-10-CM

## 2012-05-30 DIAGNOSIS — R079 Chest pain, unspecified: Secondary | ICD-10-CM

## 2012-05-30 DIAGNOSIS — I25810 Atherosclerosis of coronary artery bypass graft(s) without angina pectoris: Secondary | ICD-10-CM
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
## 2012-05-30 NOTE — Patient Instructions (Addendum)
Your physician recommends that you continue on your current medications as directed. Please refer to the Current Medication list given to you today.  Your physician recommends that you schedule a follow-up appointment in: March with Dr. Stuckey  

## 2012-05-31 ENCOUNTER — Telehealth: Payer: Self-pay | Admitting: Cardiology

## 2012-05-31 ENCOUNTER — Telehealth: Payer: Self-pay | Admitting: Pulmonary Disease

## 2012-05-31 DIAGNOSIS — E162 Hypoglycemia, unspecified: Secondary | ICD-10-CM | POA: Insufficient documentation

## 2012-05-31 LAB — BASIC METABOLIC PANEL
Calcium: 9.7 mg/dL (ref 8.4–10.5)
GFR: 79.52 mL/min (ref >60.00)
Potassium: 3.6 mEq/L (ref 3.5–5.1)
Sodium: 139 mEq/L (ref 135–145)

## 2012-05-31 MED ORDER — ONETOUCH ULTRA SYSTEM W/DEVICE KIT: 1.0000 | PACK | Freq: Once | Status: DC

## 2012-05-31 NOTE — Telephone Encounter (Signed)
Use dx code low blood sugar.   This was requested from the pt.  thanks

## 2012-05-31 NOTE — Telephone Encounter (Signed)
New Problem:     Patient called in wanting to speak with you about her post visit paperwork from yesterday.  Please call back.

## 2012-05-31 NOTE — Telephone Encounter (Signed)
New rx sent with DX. Carron Curie, CMA

## 2012-05-31 NOTE — Telephone Encounter (Signed)
Spoke with the pharmacist at [facility] She states that in order to give pt the glucose meter is to fax in a new rx with specific directions and dx code Please advise what code to use, she does not have DM. ? Pre-syncope?

## 2012-05-31 NOTE — Telephone Encounter (Signed)
I spoke with the pt and she wanted to know what her EKG showed.  The pt also wanted to know why her chart has a CABG listed as a diagnosis.  The pt has CAD but has not had CABG.

## 2012-06-01 ENCOUNTER — Telehealth: Payer: Self-pay | Admitting: Pulmonary Disease

## 2012-06-01 NOTE — Telephone Encounter (Signed)
lmomtcb for the pt.  

## 2012-06-04 ENCOUNTER — Telehealth: Payer: Self-pay | Admitting: Pulmonary Disease

## 2012-06-04 DIAGNOSIS — E162 Hypoglycemia, unspecified: Secondary | ICD-10-CM

## 2012-06-04 NOTE — Telephone Encounter (Signed)
Spoke with pt She states upset that she had to buy blood glucose meter out of pocket I explained to her that this is b/c the dx she has (low blood sugar) would not work with her ins She states that she understands this, but "all my machine says is test once a day, nothing about low blood sugar" She then stated that she had "another spell" and BS was 76, so ate a [place] burger and drank OJ She did not feel much better, and went on to explain that when she has "these spells" she can not even move at all  I advised needs ov for eval and she refused, stating, "what good is that going to do" I advised not much over the phone we can do, and she needs to be evaluated She refused appt and interrupted me numerous times during the conversation SN, please advise, thanks! Allergies  Allergen Reactions  . Moxifloxacin Other (See Comments)    REACTION: pt states she can't take Avelox.Marland KitchenMarland Kitchen

## 2012-06-04 NOTE — Telephone Encounter (Signed)
See phone note dated 06/04/12

## 2012-06-05 NOTE — Telephone Encounter (Signed)
I spoke with pt and was fine with referral. This has been placed to pcc's. Nothing further was needed

## 2012-06-05 NOTE — Telephone Encounter (Signed)
Per SN---she will need endocrinology referral/consult.   IF she would like to see someone in the [place] we can either do Dr. Everardo All or Dr. Bevelyn Ngo  Or we can refer to Dr. Talmage Nap.  thanks

## 2012-06-11 ENCOUNTER — Ambulatory Visit (INDEPENDENT_AMBULATORY_CARE_PROVIDER_SITE_OTHER): Payer: Medicare Other | Admitting: Internal Medicine

## 2012-06-11 ENCOUNTER — Encounter: Payer: Self-pay | Admitting: Internal Medicine

## 2012-06-11 VITALS — BP 122/80 | HR 69 | Temp 98.7°F | Wt 156.0 lb

## 2012-06-11 DIAGNOSIS — R569 Unspecified convulsions: Secondary | ICD-10-CM

## 2012-06-11 MED ORDER — ONETOUCH ULTRASOFT LANCETS MISC: Status: DC
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
## 2012-06-11 NOTE — Patient Instructions (Addendum)
Please check your sugars during a spell - please call me if your sugar in 55 or lower. You can start the protein shake.

## 2012-06-11 NOTE — Assessment & Plan Note (Signed)
Recent CTA was negative for sig CAD.  Etiology has always been uncertain.  Will continue to monitor.

## 2012-06-11 NOTE — Assessment & Plan Note (Signed)
This has been borderline and intermittent.  She has seen Dr. Graciela Husbands.  At some point, genetic testing might be valuable as this has always been in question.

## 2012-06-11 NOTE — Assessment & Plan Note (Signed)
She thinks she has spells of this, and is being checked out with a glucometer.  Wether this is the basis of her symptoms is unknown, but could be.  She will let Dr. Kriste Basque know

## 2012-06-15 ENCOUNTER — Telehealth: Payer: Self-pay | Admitting: Internal Medicine

## 2012-06-15 NOTE — Telephone Encounter (Signed)
Pleas call [facility] pharmacist at [place] [phone] re: dx code for pt she states that you sent in an e-script to pharmacy for dm supplies, but pt is not diabetic.

## 2012-06-15 NOTE — Telephone Encounter (Signed)
Called pharmacy and talked to [place]. She advised me to call pt's insurance to see what dx code has the lancets, strips and meter covered. I called her insurance [phone]) and apparently none of the above are covered for her, regardless of the dx code.Marland Kitchen.(!) She has a OneTouch Meter and strips that she bought, but does not have a lancing device and lancets, she now pricks her finger with a regular needle, which is much less than ideal, but will have to do for now (unless pt pays for them out of the pocket).

## 2012-06-15 NOTE — Telephone Encounter (Signed)
The [facility] pharmacy called to speak with someone regarding the patient's medications. Please call the pharmacy at [phone].

## 2012-06-18 ENCOUNTER — Other Ambulatory Visit: Payer: Self-pay | Admitting: Pulmonary Disease

## 2012-06-25 NOTE — Telephone Encounter (Signed)
I made a note in the pt's chart that CABG diagnosis is incorrect.

## 2012-06-25 NOTE — Progress Notes (Signed)
The diagnosis code 414.04 for CABG is incorrect in this pt's chart. The pt has not had CABG.  I was unable to delete this code because it was associated with the pt's orders.

## 2012-08-15 ENCOUNTER — Ambulatory Visit: Payer: Medicare Other | Admitting: Cardiology

## 2012-08-23 ENCOUNTER — Ambulatory Visit (INDEPENDENT_AMBULATORY_CARE_PROVIDER_SITE_OTHER): Payer: Medicare Other | Admitting: Cardiology

## 2012-08-23 ENCOUNTER — Encounter: Payer: Self-pay | Admitting: Cardiology

## 2012-08-23 VITALS — BP 138/80 | HR 62 | Ht 64.0 in | Wt 158.0 lb

## 2012-08-23 DIAGNOSIS — R079 Chest pain, unspecified: Secondary | ICD-10-CM

## 2012-08-23 DIAGNOSIS — I2510 Atherosclerotic heart disease of native coronary artery without angina pectoris: Secondary | ICD-10-CM

## 2012-08-23 DIAGNOSIS — Q898 Other specified congenital malformations: Secondary | ICD-10-CM

## 2012-08-23 DIAGNOSIS — I498 Other specified cardiac arrhythmias: Secondary | ICD-10-CM
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
## 2012-08-23 NOTE — Patient Instructions (Addendum)
Your physician recommends that you return for lab work: BMP, CBC and Magnesium (3/14)  Your physician recommends that you continue on your current medications as directed. Please refer to the Current Medication list given to you today.  Your physician recommends that you schedule a follow-up appointment in: 3-4 MONTHS with Dr Shirlee Latch (previous pt of Dr Riley Kill)

## 2012-08-24 ENCOUNTER — Other Ambulatory Visit (INDEPENDENT_AMBULATORY_CARE_PROVIDER_SITE_OTHER): Payer: Medicare Other

## 2012-08-24 DIAGNOSIS — R0989 Other specified symptoms and signs involving the circulatory and respiratory systems: Secondary | ICD-10-CM

## 2012-08-24 LAB — CBC WITH DIFFERENTIAL/PLATELET
Basophils Relative: 0.6 % (ref 0.0–3.0)
Eosinophils Absolute: 0.1 10*3/uL (ref 0.0–0.7)
Eosinophils Relative: 1.3 % (ref 0.0–5.0)
Lymphocytes Relative: 40.8 % (ref 12.0–46.0)
Neutrophils Relative %: 52.4 % (ref 43.0–77.0)
RBC: 4.59 Mil/uL (ref 3.87–5.11)
WBC: 4.4 10*3/uL — ABNORMAL LOW (ref 4.5–10.5)

## 2012-08-24 LAB — MAGNESIUM: Magnesium: 1.9 mg/dL (ref 1.5–2.5)

## 2012-08-24 LAB — BASIC METABOLIC PANEL
Calcium: 9.5 mg/dL (ref 8.4–10.5)
GFR: 76.04 mL/min (ref >60.00)
Sodium: 138 mEq/L (ref 135–145)

## 2012-08-25 NOTE — Assessment & Plan Note (Addendum)
Still has intermittent chest pain.  Has had CT with no evidence of sig CAD.  Calcium score of 0.  Long standing history.  Had mild abnormality at cath in past.  We continue to monitor her.

## 2012-08-25 NOTE — Assessment & Plan Note (Signed)
Has had eval by [facility] in past.  No prior genetic testing.  Something to consider in the future.   Careful about meds.

## 2012-08-25 NOTE — Assessment & Plan Note (Signed)
Done remotely at [facility].

## 2012-08-28 ENCOUNTER — Telehealth: Payer: Self-pay | Admitting: Cardiology

## 2012-08-28 NOTE — Telephone Encounter (Signed)
New Problem:    Patient returned Pat's call regarding her recent lab results.  Please call back.

## 2012-08-28 NOTE — Telephone Encounter (Signed)
PT AWARE OF LAB RESULTS COPY  MAILED AT PT'S REQUEST .Renee [name]

## 2012-09-05 ENCOUNTER — Other Ambulatory Visit: Payer: Self-pay | Admitting: Pulmonary Disease

## 2012-09-06 ENCOUNTER — Ambulatory Visit: Payer: Medicare Other | Admitting: Cardiology

## 2012-09-07 ENCOUNTER — Ambulatory Visit: Payer: Medicare Other | Admitting: Cardiology

## 2012-10-18 ENCOUNTER — Other Ambulatory Visit: Payer: Self-pay | Admitting: Pulmonary Disease

## 2012-11-09 ENCOUNTER — Other Ambulatory Visit: Payer: Self-pay

## 2012-11-09 DIAGNOSIS — Z1231 Encounter for screening mammogram for malignant neoplasm of breast: Secondary | ICD-10-CM

## 2012-11-12 ENCOUNTER — Emergency Department (HOSPITAL_COMMUNITY)
Admission: EM | Admit: 2012-11-12 | Discharge: 2012-11-13 | Disposition: A | Discharge status: Home / Self Care | Payer: Medicare Other | Attending: Emergency Medicine | Admitting: Emergency Medicine

## 2012-11-12 DIAGNOSIS — Z862 Personal history of diseases of the blood and blood-forming organs and certain disorders involving the immune mechanism: Secondary | ICD-10-CM | POA: Insufficient documentation

## 2012-11-12 DIAGNOSIS — G8929 Other chronic pain: Secondary | ICD-10-CM | POA: Insufficient documentation

## 2012-11-12 DIAGNOSIS — F411 Generalized anxiety disorder: Secondary | ICD-10-CM | POA: Insufficient documentation

## 2012-11-12 DIAGNOSIS — R079 Chest pain, unspecified: Secondary | ICD-10-CM

## 2012-11-12 DIAGNOSIS — E1149 Type 2 diabetes mellitus with other diabetic neurological complication: Secondary | ICD-10-CM | POA: Insufficient documentation

## 2012-11-12 DIAGNOSIS — Z8639 Personal history of other endocrine, nutritional and metabolic disease: Secondary | ICD-10-CM | POA: Insufficient documentation

## 2012-11-12 DIAGNOSIS — R071 Chest pain on breathing: Secondary | ICD-10-CM | POA: Insufficient documentation

## 2012-11-12 DIAGNOSIS — K219 Gastro-esophageal reflux disease without esophagitis: Secondary | ICD-10-CM | POA: Insufficient documentation

## 2012-11-12 DIAGNOSIS — Z8679 Personal history of other diseases of the circulatory system: Secondary | ICD-10-CM | POA: Insufficient documentation

## 2012-11-12 DIAGNOSIS — E119 Type 2 diabetes mellitus without complications: Secondary | ICD-10-CM | POA: Insufficient documentation

## 2012-11-12 DIAGNOSIS — F430 Acute stress reaction: Secondary | ICD-10-CM | POA: Insufficient documentation

## 2012-11-12 DIAGNOSIS — I2510 Atherosclerotic heart disease of native coronary artery without angina pectoris: Secondary | ICD-10-CM | POA: Insufficient documentation

## 2012-11-12 DIAGNOSIS — Z8669 Personal history of other diseases of the nervous system and sense organs: Secondary | ICD-10-CM | POA: Insufficient documentation

## 2012-11-12 DIAGNOSIS — R42 Dizziness and giddiness: Secondary | ICD-10-CM | POA: Insufficient documentation

## 2012-11-12 DIAGNOSIS — F439 Reaction to severe stress, unspecified: Secondary | ICD-10-CM

## 2012-11-12 DIAGNOSIS — Z8709 Personal history of other diseases of the respiratory system: Secondary | ICD-10-CM | POA: Insufficient documentation

## 2012-11-12 DIAGNOSIS — Z8673 Personal history of transient ischemic attack (TIA), and cerebral infarction without residual deficits: Secondary | ICD-10-CM | POA: Insufficient documentation

## 2012-11-12 DIAGNOSIS — Z8541 Personal history of malignant neoplasm of cervix uteri: Secondary | ICD-10-CM | POA: Insufficient documentation

## 2012-11-12 DIAGNOSIS — Z8739 Personal history of other diseases of the musculoskeletal system and connective tissue: Secondary | ICD-10-CM | POA: Insufficient documentation

## 2012-11-12 DIAGNOSIS — Z79899 Other long term (current) drug therapy: Secondary | ICD-10-CM | POA: Insufficient documentation

## 2012-11-12 DIAGNOSIS — E1142 Type 2 diabetes mellitus with diabetic polyneuropathy: Secondary | ICD-10-CM | POA: Insufficient documentation

## 2012-11-12 DIAGNOSIS — I10 Essential (primary) hypertension: Secondary | ICD-10-CM | POA: Insufficient documentation

## 2012-11-12 DIAGNOSIS — Z7982 Long term (current) use of aspirin: Secondary | ICD-10-CM | POA: Insufficient documentation

## 2012-11-12 DIAGNOSIS — Z8719 Personal history of other diseases of the digestive system: Secondary | ICD-10-CM | POA: Insufficient documentation

## 2012-11-12 HISTORY — DX: Abnormal electrocardiogram (ECG) (EKG): R94.31

## 2012-11-13 ENCOUNTER — Encounter (HOSPITAL_COMMUNITY): Payer: Self-pay

## 2012-11-13 ENCOUNTER — Other Ambulatory Visit: Payer: Self-pay

## 2012-11-13 ENCOUNTER — Emergency Department (HOSPITAL_COMMUNITY): Payer: Medicare Other

## 2012-11-13 DIAGNOSIS — R079 Chest pain, unspecified: Secondary | ICD-10-CM

## 2012-11-13 LAB — POCT I-STAT, CHEM 8
BUN: 14 mg/dL (ref 6–23)
Calcium, Ion: 1.24 mmol/L (ref 1.13–1.30)
TCO2: 26 mmol/L (ref 0–100)

## 2012-11-13 LAB — CBC WITH DIFFERENTIAL/PLATELET
Basophils Absolute: 0 10*3/uL (ref 0.0–0.1)
Basophils Relative: 0 % (ref 0–1)
Eosinophils Absolute: 0.1 10*3/uL (ref 0.0–0.7)
Eosinophils Relative: 1 % (ref 0–5)
HCT: 38.8 % (ref 36.0–46.0)
MCH: 27.8 pg (ref 26.0–34.0)
MCHC: 34.3 g/dL (ref 30.0–36.0)
MCV: 81.2 fL (ref 78.0–100.0)
Monocytes Absolute: 0.4 10*3/uL (ref 0.1–1.0)
RDW: 12.6 % (ref 11.5–15.5)

## 2012-11-13 LAB — POCT I-STAT TROPONIN I: Troponin i, poc: 0.01 ng/mL (ref 0.00–0.08)

## 2012-11-13 MED ORDER — NAPROXEN 250 MG PO TABS
250.0000 mg | ORAL_TABLET | Freq: Once | ORAL | Status: AC
  Administered 2012-11-13: 250 mg via ORAL
  Filled 2012-11-13: qty 1

## 2012-11-13 NOTE — ED Notes (Signed)
Patient to ED via [facility] c/o chest pain that started about 2 hours ago. Chest pain started while at rest. Initial pain was 9/10 currently in no pain. Pt took 2 nitro at home and 324 ASA prior to EMS arrival. Upon EMS arrival pt was in no pain. NSR. Pt has a hx of A. Fib.

## 2012-11-13 NOTE — ED Notes (Signed)
EDP at bedside. Would like run another istat troponin before pt is discharged. Cardiology stated that pt could go home but pt states that she is concerned because she had dizziness with the chest pain. Pt remains chest pain free.

## 2012-11-13 NOTE — ED Provider Notes (Signed)
History     CSN: [id number]  Arrival date & time 11/12/12  2357   First MD Initiated Contact with Patient 11/13/12 0056      Chief Complaint  Patient presents with  . Chest Pain   HPI Renee [name] is a 67 y.o. female who presents via EMS with a complaint of chest pain started about 10:30 this evening. Patient says she's had a busy day and has been quite "rushed" lately, she had a "hip appointment" today with her orthopedic surgeon in [place], and she is caring for her mother who is at home in hospice. She says she's been under increased stress with this. She left her mother with a sitter at 9:30 tonight and trouble after she got home started having some left-sided chest pain. This initially started while she was at rest, it was 9/10, and was tightening, through her whole chest, she took her first nitroglycerin at about 2211, then she took another one at 2045 and started feeling dizzy.  [name] had no associated nausea, vomiting, diaphoresis, abdominal pain, shortness of breath.   Past Medical History  Diagnosis Date  . Bronchitis     Recurrent  . Hypertension   . Coronary artery disease     cath 2005- nonobstructive; LCx 30-40%; stress Myoview 03/13- low risk; 2D echo 04/13 with LVEF 60-65%  . AV nodal re-entry tachycardia     s/p RFA 1993- Dr. Delena Serve, [facility]  . Long Q-T syndrome   . GERD (gastroesophageal reflux disease)   . Dysphagia   . Duodenitis without hemorrhage   . Diverticulosis of colon   . Irritable bowel syndrome   . Constipation, chronic   . Hemorrhoids   . Fecal incontinence   . Thyroid nodule   . Degenerative joint disease   . Lumbar back pain   . Fibromyalgia   . Chronic pain syndrome   . Neuropathy   . Anxiety   . Pre-syncope   . Complication of anesthesia     " slow to awake prolong QT "  . Recurrent upper respiratory infection (URI)   . Diabetes mellitus     diet controled  . Cancer     hx of cervical   ca  . Chronic chest pain   . NSVT (nonsustained  ventricular tachycardia)     Recent OP event monitor  . Heart attack   . Osteoporosis   . Chills   . Nasal congestion   . Trouble swallowing   . Change in voice   . Visual disturbance   . Chest pain   . Leg swelling   . Palpitations   . Generalized headaches   . Weakness   . Bruises easily     Past Surgical History  Procedure Laterality Date  . Biopsy thyroid    . Total hip arthroplasty  10/10    Left, at [facility]  . Total hip arthroplasty  10/10    Left, at [facility]  . Hysteroscopy  2002    with resection of endometrial polyps. by Dr.MCPhail  . Coronary angioplasty  2007  . Cardiac electrophysiology study and ablation      atrioventricular nodal reentant tachycardia  . Ablation of dysrhythmic focus  1993  . Cholecystectomy  1980  . Orif radial head / neck fracture  09/05/88    Family History  Problem Relation Age of Onset  . Ovarian cancer Mother   . Stroke Mother   . Diabetes Sister     Pacemaker, CHF  .  Lung cancer Father     Heart problems    History  Substance Use Topics  . Smoking status: Never Smoker   . Smokeless tobacco: Never Used  . Alcohol Use: Yes     Comment: Occasional     OB History   Grav Para Term Preterm Abortions TAB SAB Ect Mult Living                  Review of Systems At least 10pt or greater review of systems completed and are negative except where specified in the HPI.  Allergies  Moxifloxacin  Home Medications   Current Outpatient Rx  Name  Route  Sig  Dispense  Refill  . ALPRAZolam (XANAX) 0.5 MG tablet      TAKE ONE-HALF TO ONE TABLET BY MOUTH THREE TIMES DAILY AS NEEDED   90 tablet   0     Pt will need ov with Dr. Kriste Basque for further refills   . aspirin EC 81 MG tablet   Oral   Take 81 mg by mouth daily.         . Blood Glucose Monitoring Suppl (ONE TOUCH ULTRA SYSTEM KIT) W/DEVICE KIT   Does not apply   1 kit by Does not apply route once. Test blood once a day. DX 251.2   1 each   0   .  cholecalciferol (VITAMIN D) 1000 UNITS tablet   Oral   Take 2,000 Units by mouth daily.         Marland Kitchen dicyclomine (BENTYL) 20 MG tablet   Oral   Take 20 mg by mouth daily.         Marland Kitchen esomeprazole (NEXIUM) 20 MG capsule   Oral   Take 20 mg by mouth daily before breakfast.         . estradiol (ESTRACE) 1 MG tablet   Oral   Take 1 mg by mouth daily.         Marland Kitchen glucose blood test strip      Test blood sugar once daily   100 each   12   . Lancets (ONETOUCH ULTRASOFT) lancets      Test blood sugar once daily   100 each   12   . medroxyPROGESTERone (PROVERA) 2.5 MG tablet   Oral   Take 2.5 mg by mouth daily.         . naproxen sodium (ANAPROX) 220 MG tablet   Oral   Take 220 mg by mouth 2 (two) times daily as needed. For pain         . nitroGLYCERIN (NITROSTAT) 0.4 MG SL tablet   Sublingual   Place 0.4 mg under the tongue every 5 (five) minutes as needed. For chest pain         . omeprazole (PRILOSEC) 40 MG capsule      Daily.         . potassium chloride (K-DUR) 10 MEQ tablet   Oral   Take 1 tablet (10 mEq total) by mouth daily.   30 tablet   6   . triamterene-hydrochlorothiazide (MAXZIDE-25) 37.5-25 MG per tablet      Take 1/2 tablet by mouth daily         . triamterene-hydrochlorothiazide (MAXZIDE-25) 37.5-25 MG per tablet      TAKE ONE-HALF TABLET BY MOUTH EVERY DAY   30 tablet   2     Needs ov with Dr Kriste Basque   . zolpidem (AMBIEN) 10 MG tablet  TAKE ONE-HALF TO ONE TABLET BY MOUTH AT BEDTIME   30 tablet   5     BP 119/70  Pulse 69  Temp(Src) 97.7 F (36.5 C)  Resp 16  SpO2 100%  Physical Exam  Nursing notes reviewed.  Electronic medical record reviewed. VITAL SIGNS:   Filed Vitals:   11/13/12 0001 11/13/12 0009 11/13/12 0014 11/13/12 0145  BP:  119/70  130/98  Pulse:  69  67  Temp:   97.7 F (36.5 C)   TempSrc:  Oral    Resp:  16  16  SpO2: 99% 100%     CONSTITUTIONAL: Awake, oriented, appears non-toxic HENT:  Atraumatic, normocephalic, oral mucosa pink and moist, airway patent. Nares patent without drainage. External ears normal. EYES: Conjunctiva clear, EOMI, PERRLA NECK: Trachea midline, non-tender, supple CARDIOVASCULAR: Normal heart rate, Normal rhythm, No murmurs, rubs, gallops PULMONARY/CHEST: Clear to auscultation, no rhonchi, wheezes, or rales. Symmetrical breath sounds. Non-tender. ABDOMINAL: Non-distended, soft, non-tender - no rebound or guarding.  BS normal. NEUROLOGIC: Non-focal, moving all four extremities, no gross sensory or motor deficits. EXTREMITIES: No clubbing, cyanosis, or edema SKIN: Warm, Dry, No erythema, No rash  ED Course  Procedures (including critical care time)  Date: 11/13/2012  Rate:   Rhythm: normal sinus rhythm  QRS Axis: normal  Intervals: normal  ST/T Wave abnormalities: normal  Conduction Disutrbances: none  Narrative Interpretation: unremarkable   Labs Reviewed  POCT I-STAT, CHEM 8 - Abnormal; Notable for the following:    Glucose, Bld 113 (*)    All other components within normal limits  CBC WITH DIFFERENTIAL  POCT I-STAT TROPONIN I  POCT I-STAT TROPONIN I   Dg Chest Port 1 View  11/13/2012   *RADIOLOGY REPORT*  Clinical Data: Syncope and chest pain  PORTABLE CHEST - 1 VIEW  Comparison: 10/07/2011  Findings: Heart size is upper limits of normal.  Cardiac leads obscure detail.  The lungs are clear.  No pleural effusion.  No acute osseous abnormality.  Distal right clavicular deformity again noted.  IMPRESSION: No acute cardiopulmonary process.  Heart size at upper limits of normal.   Original Report Authenticated By: Christiana Pellant, M.D.     1. Chest pain   2. Chronic chest pain   3. Dizziness   4. Stress at home       MDM  Renee [name] is a 67 y.o. female who had an episode of chest pain, has had chest pain chronically in the past, has had negative stress test last March, she also had a negative coronary CT angiogram last year without  any evidence of plaques or calcium. EKG is unchanged, troponin is negative x2. Heart score of 3. Patient does have a followup appointment with Dr. Shirlee Latch and she's been evaluated in the emergency department by cardiologist Dr. Antoine Poche who feels she is safe for outpatient followup.  With the patient's negative diagnostic tests, troponins x2 which are negative, unchanged EKG, this patient is safe for discharge and outpatient followup.  I explained the diagnosis and have given explicit precautions to return to the ER including worsening chest pain, shortness of breath or any other new or worsening symptoms. The patient understands and accepts the medical plan as it's been dictated and I have answered their questions. Discharge instructions concerning home care and prescriptions have been given.  The patient is STABLE and is discharged to home in good condition.       Jones Skene, MD 11/13/12 [phone]
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
## 2012-11-13 NOTE — ED Notes (Signed)
Cardiology at bedside.

## 2012-11-13 NOTE — ED Notes (Signed)
Reports pts mild nausea and 2/10 headache at this time. Pt took nitro pta. Pt denies chest pain or sob. Pt remains on monitor.

## 2012-11-14 ENCOUNTER — Other Ambulatory Visit: Payer: Self-pay | Admitting: Obstetrics and Gynecology

## 2012-11-23 ENCOUNTER — Ambulatory Visit
Admission: RE | Admit: 2012-11-23 | Discharge: 2012-11-23 | Disposition: A | Discharge status: Home / Self Care | Payer: Medicare Other | Source: Ambulatory Visit

## 2012-11-23 DIAGNOSIS — Z1231 Encounter for screening mammogram for malignant neoplasm of breast: Secondary | ICD-10-CM

## 2012-11-24 ENCOUNTER — Emergency Department (HOSPITAL_COMMUNITY)
Admission: EM | Admit: 2012-11-24 | Discharge: 2012-11-25 | Disposition: A | Discharge status: Home / Self Care | Payer: Medicare Other | Attending: Emergency Medicine | Admitting: Emergency Medicine

## 2012-11-24 ENCOUNTER — Encounter (HOSPITAL_COMMUNITY): Payer: Self-pay | Admitting: Emergency Medicine

## 2012-11-24 DIAGNOSIS — G894 Chronic pain syndrome: Secondary | ICD-10-CM | POA: Insufficient documentation

## 2012-11-24 DIAGNOSIS — M810 Age-related osteoporosis without current pathological fracture: Secondary | ICD-10-CM | POA: Insufficient documentation

## 2012-11-24 DIAGNOSIS — Z8669 Personal history of other diseases of the nervous system and sense organs: Secondary | ICD-10-CM | POA: Insufficient documentation

## 2012-11-24 DIAGNOSIS — Z8739 Personal history of other diseases of the musculoskeletal system and connective tissue: Secondary | ICD-10-CM | POA: Insufficient documentation

## 2012-11-24 DIAGNOSIS — K219 Gastro-esophageal reflux disease without esophagitis: Secondary | ICD-10-CM | POA: Insufficient documentation

## 2012-11-24 DIAGNOSIS — Z8639 Personal history of other endocrine, nutritional and metabolic disease: Secondary | ICD-10-CM | POA: Insufficient documentation

## 2012-11-24 DIAGNOSIS — Z8541 Personal history of malignant neoplasm of cervix uteri: Secondary | ICD-10-CM | POA: Insufficient documentation

## 2012-11-24 DIAGNOSIS — Z8719 Personal history of other diseases of the digestive system: Secondary | ICD-10-CM | POA: Insufficient documentation

## 2012-11-24 DIAGNOSIS — Z8679 Personal history of other diseases of the circulatory system: Secondary | ICD-10-CM | POA: Insufficient documentation

## 2012-11-24 DIAGNOSIS — Z9861 Coronary angioplasty status: Secondary | ICD-10-CM | POA: Insufficient documentation

## 2012-11-24 DIAGNOSIS — Z8709 Personal history of other diseases of the respiratory system: Secondary | ICD-10-CM | POA: Insufficient documentation

## 2012-11-24 DIAGNOSIS — R413 Other amnesia: Secondary | ICD-10-CM | POA: Insufficient documentation

## 2012-11-24 DIAGNOSIS — F411 Generalized anxiety disorder: Secondary | ICD-10-CM | POA: Insufficient documentation

## 2012-11-24 DIAGNOSIS — R112 Nausea with vomiting, unspecified: Secondary | ICD-10-CM

## 2012-11-24 DIAGNOSIS — Z7982 Long term (current) use of aspirin: Secondary | ICD-10-CM | POA: Insufficient documentation

## 2012-11-24 DIAGNOSIS — Z79899 Other long term (current) drug therapy: Secondary | ICD-10-CM | POA: Insufficient documentation

## 2012-11-24 DIAGNOSIS — R6883 Chills (without fever): Secondary | ICD-10-CM | POA: Insufficient documentation

## 2012-11-24 DIAGNOSIS — Z862 Personal history of diseases of the blood and blood-forming organs and certain disorders involving the immune mechanism: Secondary | ICD-10-CM | POA: Insufficient documentation

## 2012-11-24 DIAGNOSIS — R52 Pain, unspecified: Secondary | ICD-10-CM | POA: Insufficient documentation

## 2012-11-24 DIAGNOSIS — I2510 Atherosclerotic heart disease of native coronary artery without angina pectoris: Secondary | ICD-10-CM | POA: Insufficient documentation

## 2012-11-24 LAB — CBC WITH DIFFERENTIAL/PLATELET
Basophils Absolute: 0 10*3/uL (ref 0.0–0.1)
Basophils Relative: 0 % (ref 0–1)
Eosinophils Absolute: 0 K/uL (ref 0.0–0.7)
Eosinophils Relative: 0 % (ref 0–5)
HCT: 39 % (ref 36.0–46.0)
Hemoglobin: 13.1 g/dL (ref 12.0–15.0)
Lymphocytes Relative: 16 % (ref 12–46)
Lymphs Abs: 1 10*3/uL (ref 0.7–4.0)
MCH: 27 pg (ref 26.0–34.0)
MCHC: 33.6 g/dL (ref 30.0–36.0)
MCV: 80.2 fL (ref 78.0–100.0)
Monocytes Absolute: 0.2 10*3/uL (ref 0.1–1.0)
Monocytes Relative: 3 % (ref 3–12)
Neutro Abs: 5.2 K/uL (ref 1.7–7.7)
Neutrophils Relative %: 81 % — ABNORMAL HIGH (ref 43–77)
Platelets: 205 K/uL (ref 150–400)
RBC: 4.86 MIL/uL (ref 3.87–5.11)
RDW: 12.5 % (ref 11.5–15.5)
WBC: 6.5 10*3/uL (ref 4.0–10.5)

## 2012-11-24 LAB — COMPREHENSIVE METABOLIC PANEL
BUN: 9 mg/dL (ref 6–23)
CO2: 25 mEq/L (ref 19–32)
Calcium: 9.7 mg/dL (ref 8.4–10.5)
Creatinine, Ser: 0.55 mg/dL (ref 0.50–1.10)
GFR calc Af Amer: >90 mL/min (ref >90)
GFR calc non Af Amer: >90 mL/min (ref >90)
Glucose, Bld: 127 mg/dL — ABNORMAL HIGH (ref 70–99)

## 2012-11-24 LAB — COMPREHENSIVE METABOLIC PANEL WITH GFR
ALT: 16 U/L (ref 0–35)
AST: 22 U/L (ref 0–37)
Albumin: 4.1 g/dL (ref 3.5–5.2)
Alkaline Phosphatase: 62 U/L (ref 39–117)
Chloride: 103 meq/L (ref 96–112)
Potassium: 3.7 meq/L (ref 3.5–5.1)
Sodium: 140 meq/L (ref 135–145)
Total Bilirubin: 0.7 mg/dL (ref 0.3–1.2)
Total Protein: 7 g/dL (ref 6.0–8.3)

## 2012-11-24 LAB — POCT I-STAT TROPONIN I: Troponin i, poc: 0 ng/mL (ref 0.00–0.08)

## 2012-11-24 LAB — LIPASE, BLOOD: Lipase: 22 U/L (ref 11–59)

## 2012-11-24 MED ORDER — SODIUM CHLORIDE 0.9 % IV BOLUS (SEPSIS)
500.0000 mL | Freq: Once | INTRAVENOUS | Status: AC
  Administered 2012-11-25: 500 mL via INTRAVENOUS

## 2012-11-24 MED ORDER — ONDANSETRON HCL 4 MG/2ML IJ SOLN
4.0000 mg | Freq: Once | INTRAMUSCULAR | Status: AC
  Administered 2012-11-24: 4 mg via INTRAVENOUS
  Filled 2012-11-24: qty 2

## 2012-11-24 MED ORDER — SODIUM CHLORIDE 0.9 % IV SOLN
1000.0000 mL | Freq: Once | INTRAVENOUS | Status: AC
  Administered 2012-11-24: 1000 mL via INTRAVENOUS

## 2012-11-24 MED ORDER — LORAZEPAM 2 MG/ML IJ SOLN
0.5000 mg | Freq: Once | INTRAMUSCULAR | Status: AC
  Administered 2012-11-25: 0.5 mg via INTRAVENOUS
  Filled 2012-11-24: qty 1

## 2012-11-24 MED ORDER — PANTOPRAZOLE SODIUM 40 MG IV SOLR
40.0000 mg | Freq: Once | INTRAVENOUS | Status: AC
  Administered 2012-11-25: 40 mg via INTRAVENOUS
  Filled 2012-11-24: qty 40

## 2012-11-24 NOTE — ED Notes (Signed)
Bed:WA13<BR> Expected date:<BR> Expected time:<BR> Means of arrival:<BR> Comments:<BR> EMS

## 2012-11-24 NOTE — ED Notes (Addendum)
[place].5 [place] and drinking a margarita and began to feel sick with n/v. Pt was hyperventilating on scene, once pt was able to calm down pt began to feel a little better, but still c/o n/v.  Pt has vomited x3 today.  Pt denies pain at this time.  Reports generalized weakness. Pt stated she was seen at [facility] on Monday for tachycardia.  NAD noted at this time.

## 2012-11-25 ENCOUNTER — Encounter (HOSPITAL_COMMUNITY): Payer: Self-pay | Admitting: Emergency Medicine

## 2012-11-25 LAB — URINALYSIS, ROUTINE W REFLEX MICROSCOPIC
Bilirubin Urine: NEGATIVE
Glucose, UA: NEGATIVE mg/dL
Hgb urine dipstick: NEGATIVE
Ketones, ur: NEGATIVE mg/dL
Leukocytes, UA: NEGATIVE
Nitrite: NEGATIVE
Protein, ur: NEGATIVE mg/dL
Specific Gravity, Urine: 1.013 (ref 1.005–1.030)
Urobilinogen, UA: 0.2 mg/dL (ref 0.0–1.0)
pH: 8 (ref 5.0–8.0)

## 2012-11-25 MED ORDER — ONDANSETRON 8 MG PO TBDP: 8.0000 mg | ORAL_TABLET | Freq: Two times a day (BID) | ORAL | Status: DC | PRN
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
## 2012-11-25 NOTE — Discharge Instructions (Signed)
Nausea and Vomiting  Nausea is a sick feeling that often comes before throwing up (vomiting). Vomiting is a reflex where stomach contents come out of your mouth. Vomiting can cause severe loss of body fluids (dehydration). Children and elderly adults can become dehydrated quickly, especially if they also have diarrhea. Nausea and vomiting are symptoms of a condition or disease. It is important to find the cause of your symptoms.  CAUSES   ? Direct irritation of the stomach lining. This irritation can result from increased acid production (gastroesophageal reflux disease), infection, food poisoning, taking certain medicines (such as nonsteroidal anti-inflammatory drugs), alcohol use, or tobacco use.  ? Signals from the brain.?These signals could be caused by a headache, heat exposure, an inner ear disturbance, increased pressure in the brain from injury, infection, a tumor, or a concussion, pain, emotional stimulus, or metabolic problems.  ? An obstruction in the gastrointestinal tract (bowel obstruction).  ? Illnesses such as diabetes, hepatitis, gallbladder problems, appendicitis, kidney problems, cancer, sepsis, atypical symptoms of a heart attack, or eating disorders.  ? Medical treatments such as chemotherapy and radiation.  ? Receiving medicine that makes you sleep (general anesthetic) during surgery.  DIAGNOSIS  Your caregiver may ask for tests to be done if the problems do not improve after a few days. Tests may also be done if symptoms are severe or if the reason for the nausea and vomiting is not clear. Tests may include:  ? Urine tests.  ? Blood tests.  ? Stool tests.  ? Cultures (to look for evidence of infection).  ? X-rays or other imaging studies.  Test results can help your caregiver make decisions about treatment or the need for additional tests.  TREATMENT  You need to stay well hydrated. Drink frequently but in small amounts.?You may wish to drink water, sports drinks, clear broth, or eat frozen  ice pops or gelatin dessert to help stay hydrated.?When you eat, eating slowly may help prevent nausea.?There are also some antinausea medicines that may help prevent nausea.  HOME CARE INSTRUCTIONS   ? Take all medicine as directed by your caregiver.  ? If you do not have an appetite, do not force yourself to eat. However, you must continue to drink fluids.  ? If you have an appetite, eat a normal diet unless your caregiver tells you differently.  ? Eat a variety of complex carbohydrates (rice, wheat, potatoes, bread), lean meats, yogurt, fruits, and vegetables.  ? Avoid high-fat foods because they are more difficult to digest.  ? Drink enough water and fluids to keep your urine clear or pale yellow.  ? If you are dehydrated, ask your caregiver for specific rehydration instructions. Signs of dehydration may include:  ? Severe thirst.  ? Dry lips and mouth.  ? Dizziness.  ? Dark urine.  ? Decreasing urine frequency and amount.  ? Confusion.  ? Rapid breathing or pulse.  SEEK IMMEDIATE MEDICAL CARE IF:   ? You have blood or brown flecks (like coffee grounds) in your vomit.  ? You have black or bloody stools.  ? You have a severe headache or stiff neck.  ? You are confused.  ? You have severe abdominal pain.  ? You have chest pain or trouble breathing.  ? You do not urinate at least once every 8 hours.  ? You develop cold or clammy skin.  ? You continue to vomit for longer than 24 to 48 hours.  ? You have a fever.  MAKE SURE YOU:   ?   Understand these instructions.  ? Will watch your condition.  ? Will get help right away if you are not doing well or get worse.  Document Released: 05/30/2005 Document Revised: 08/22/2011 Document Reviewed: 10/27/2010  ExitCare? Patient Information ?2014 [place], [place].  Nausea and Vomiting  Nausea is a sick feeling that often comes before throwing up (vomiting). Vomiting is a reflex where stomach contents come out of your mouth. Vomiting can cause severe loss of body fluids  (dehydration). Children and elderly adults can become dehydrated quickly, especially if they also have diarrhea. Nausea and vomiting are symptoms of a condition or disease. It is important to find the cause of your symptoms.  CAUSES   ? Direct irritation of the stomach lining. This irritation can result from increased acid production (gastroesophageal reflux disease), infection, food poisoning, taking certain medicines (such as nonsteroidal anti-inflammatory drugs), alcohol use, or tobacco use.  ? Signals from the brain.?These signals could be caused by a headache, heat exposure, an inner ear disturbance, increased pressure in the brain from injury, infection, a tumor, or a concussion, pain, emotional stimulus, or metabolic problems.  ? An obstruction in the gastrointestinal tract (bowel obstruction).  ? Illnesses such as diabetes, hepatitis, gallbladder problems, appendicitis, kidney problems, cancer, sepsis, atypical symptoms of a heart attack, or eating disorders.  ? Medical treatments such as chemotherapy and radiation.  ? Receiving medicine that makes you sleep (general anesthetic) during surgery.  DIAGNOSIS  Your caregiver may ask for tests to be done if the problems do not improve after a few days. Tests may also be done if symptoms are severe or if the reason for the nausea and vomiting is not clear. Tests may include:  ? Urine tests.  ? Blood tests.  ? Stool tests.  ? Cultures (to look for evidence of infection).  ? X-rays or other imaging studies.  Test results can help your caregiver make decisions about treatment or the need for additional tests.  TREATMENT  You need to stay well hydrated. Drink frequently but in small amounts.?You may wish to drink water, sports drinks, clear broth, or eat frozen ice pops or gelatin dessert to help stay hydrated.?When you eat, eating slowly may help prevent nausea.?There are also some antinausea medicines that may help prevent nausea.  HOME CARE INSTRUCTIONS   ? Take  all medicine as directed by your caregiver.  ? If you do not have an appetite, do not force yourself to eat. However, you must continue to drink fluids.  ? If you have an appetite, eat a normal diet unless your caregiver tells you differently.  ? Eat a variety of complex carbohydrates (rice, wheat, potatoes, bread), lean meats, yogurt, fruits, and vegetables.  ? Avoid high-fat foods because they are more difficult to digest.  ? Drink enough water and fluids to keep your urine clear or pale yellow.  ? If you are dehydrated, ask your caregiver for specific rehydration instructions. Signs of dehydration may include:  ? Severe thirst.  ? Dry lips and mouth.  ? Dizziness.  ? Dark urine.  ? Decreasing urine frequency and amount.  ? Confusion.  ? Rapid breathing or pulse.  SEEK IMMEDIATE MEDICAL CARE IF:   ? You have blood or brown flecks (like coffee grounds) in your vomit.  ? You have black or bloody stools.  ? You have a severe headache or stiff neck.  ? You are confused.  ? You have severe abdominal pain.  ? You have chest pain or trouble  breathing.  ? You do not urinate at least once every 8 hours.  ? You develop cold or clammy skin.  ? You continue to vomit for longer than 24 to 48 hours.  ? You have a fever.  MAKE SURE YOU:   ? Understand these instructions.  ? Will watch your condition.  ? Will get help right away if you are not doing well or get worse.  Document Released: 05/30/2005 Document Revised: 08/22/2011 Document Reviewed: 10/27/2010  ExitCare? Patient Information ?2014 [place], [place].

## 2012-12-03 ENCOUNTER — Encounter: Payer: Medicare Other | Admitting: Cardiology

## 2012-12-04 ENCOUNTER — Encounter: Payer: Self-pay | Admitting: Cardiology

## 2012-12-04 ENCOUNTER — Ambulatory Visit (INDEPENDENT_AMBULATORY_CARE_PROVIDER_SITE_OTHER): Payer: Medicare Other | Admitting: Cardiology

## 2012-12-04 VITALS — BP 126/80 | HR 71 | Ht 63.0 in | Wt 159.0 lb

## 2012-12-04 DIAGNOSIS — I10 Essential (primary) hypertension: Secondary | ICD-10-CM

## 2012-12-04 DIAGNOSIS — R079 Chest pain, unspecified: Secondary | ICD-10-CM

## 2012-12-04 DIAGNOSIS — I472 Ventricular tachycardia: Secondary | ICD-10-CM

## 2012-12-04 DIAGNOSIS — Q898 Other specified congenital malformations: Secondary | ICD-10-CM

## 2012-12-04 MED ORDER — TRIAMTERENE-HCTZ 37.5-25 MG PO CAPS: 1.0000 | ORAL_CAPSULE | ORAL | Status: DC

## 2012-12-04 MED ORDER — ISOSORBIDE MONONITRATE ER 30 MG PO TB24: 30.0000 mg | ORAL_TABLET | Freq: Every day | ORAL | Status: DC

## 2012-12-04 NOTE — Patient Instructions (Addendum)
Start Imdur (isosorbide) 30mg  daily.   Increase triamterene/HCT 37.5/25mg  to 1 tablet daily.  Your physician recommends that you schedule a follow-up appointment in: 3 months with the PA/NP.

## 2012-12-05 NOTE — Progress Notes (Signed)
Patient ID: Renee [name], female   DOB: April 11, 1946, 67 y.o.   MRN: [id number] PCP: Dr. Kriste Basque  67 yo with h/o AVNRT ablation, NSVT, mild long QT syndrome, and chronic chest pain syndrome presents for cardiology followup  She has seen Dr. Riley Kill in the past and is seeing me for the first time today.  She has a long history of chest pain.  Most recent study was a coronary CT angiogram in 4/13 that showed no coronary plaque and calcium score = 0.  She was in the ER earlier this month with chest pain, workup was benign.  She gets periodic episodes of atypical chest pain.  There is no trigger.  She will get "discomfort" that extends from her central chest down into her abdomen.  It tends to be nitrate-responsive.  She brings a log of blood pressures.  Her systolic pressure ranges from the 130s - 150s. She has a history of symptomatic NSVT treated briefly with flecainide as well as mild long QT syndrome.  She denies recent tachypalpitations, syncope or presyncope.    ECG: (11/25/12): NSR, QTc 474 msec  Labs (6/13): LDL 39, HDL 70 Labs (6/14): K 3.7, creatinine 0.55  PMH: 1. AVNRT ablation in 1993 at [facility]. 2. Long QT syndrome: Has history of mild QT prolongation by ECG.  Avoid medications that would prolong the QT interval. 3. NSVT: Was on flecainide in the past for symptoms (saw Dr. Ladona Ridgel).  4. Chest pain: LHC (2005) with with 30-40% LCx stenosis.  Myoview in 3/13 was low risk.  Coronary CT angiogram in 4/13 with calcium score = 0 and no plaque seen in coronaries.  ? Microvascular angina.  5. IBS 6. Diverticulosis 7. Low back pain.  8. Diet-controlled diabetes. 9. H/o cervical cancer.  10. L TKR 11. H/o CCY 12. HTN  SH: [employer] in [place], divorced, on disability, nonsmoker.   FH: Sister with pacemaker.   ROS: All systems reviewed and negative except as per HPI.   Current Outpatient Prescriptions  Medication Sig Dispense Refill  . ALPRAZolam (XANAX) 0.5 MG tablet Take 0.5 mg by mouth 2  (two) times daily as needed for anxiety.      Marland Kitchen aspirin EC 81 MG tablet Take 81 mg by mouth every morning.       . Cyanocobalamin (VITAMIN B-12 PO) Take 5,000 Units by mouth daily.      . cyanocobalamin 1000 MCG tablet Take 5,000 mcg by mouth every morning.       . dicyclomine (BENTYL) 20 MG tablet Take 20 mg by mouth every morning.       Marland Kitchen estradiol (ESTRACE) 2 MG tablet Take 1 tablet by mouth daily.      . medroxyPROGESTERone (PROVERA) 2.5 MG tablet Take 2.5 mg by mouth every morning.       . naproxen sodium (ANAPROX) 220 MG tablet Take 440 mg by mouth daily as needed.      . nitroGLYCERIN (NITROSTAT) 0.4 MG SL tablet Place 0.4 mg under the tongue every 5 (five) minutes as needed. For chest pain      . omeprazole (PRILOSEC) 40 MG capsule Take 40 mg by mouth every morning.       . ondansetron (ZOFRAN-ODT) 8 MG disintegrating tablet Take 1 tablet (8 mg total) by mouth every 12 (twelve) hours as needed for nausea.  20 tablet  0  . potassium chloride (K-DUR) 10 MEQ tablet Take 10 mEq by mouth every morning.      . zolpidem (AMBIEN)  10 MG tablet Take 5 mg by mouth at bedtime as needed for sleep.      . isosorbide mononitrate (IMDUR) 30 MG 24 hr tablet Take 1 tablet (30 mg total) by mouth daily.  30 tablet  4  . triamterene-hydrochlorothiazide (DYAZIDE) 37.5-25 MG per capsule Take 1 each (1 capsule total) by mouth every morning.  30 capsule  4   No current facility-administered medications for this visit.    BP 126/80  Pulse 71  Ht 5\' 3"  (1.6 m)  Wt 159 lb (72.122 kg)  BMI 28.17 kg/m2  SpO2 99% General: NAD Neck: No JVD, no thyromegaly or thyroid nodule.  Lungs: Clear to auscultation bilaterally with normal respiratory effort. CV: Nondisplaced PMI.  Heart regular S1/S2, no S3/S4, no murmur.  No peripheral edema.  No carotid bruit.  Normal pedal pulses.  Abdomen: Soft, nontender, no hepatosplenomegaly, no distention.  Neurologic: Alert and oriented x 3.  Psych: Normal affect. Extremities:  No clubbing or cyanosis.   Assessment/Plan: 1. Rhythm: Prior history of AVNRT ablation and symptomatic NSVT.  She does not report syncope, lightheadedness or tachypalpitations.  2. Long QT syndrome: Mild.  Avoid QT-prolonging medications.  3. Chest pain syndrome: Long history of atypical chest pain.  Workups have not been suggestive of macrovascular coronary disease.  Cannot rule out microvascular angina.  Her pain does tend to be nitrate-responsive.  I will have her try Imdur 30 mg daily to see if this can suppress the chest pain.  4. HTN: BP runs high at home.  Increase triamterene/HCTZ to full pill daily.   Marca Ancona 12/05/2012

## 2012-12-07 ENCOUNTER — Ambulatory Visit: Payer: Medicare Other

## 2012-12-12 ENCOUNTER — Telehealth: Payer: Self-pay | Admitting: Cardiology

## 2012-12-12 DIAGNOSIS — I472 Ventricular tachycardia: Secondary | ICD-10-CM

## 2012-12-12 NOTE — Telephone Encounter (Signed)
LMTCB

## 2012-12-12 NOTE — Telephone Encounter (Signed)
Pt.notified

## 2012-12-12 NOTE — Telephone Encounter (Signed)
New problem    Pt wants to discuss a-fib attacks from last night & Monday night

## 2012-12-12 NOTE — Telephone Encounter (Signed)
F/u ° ° °Pt returned your call °

## 2012-12-12 NOTE — Telephone Encounter (Signed)
Let's put her on a 3 week monitor to assess.

## 2012-12-12 NOTE — Telephone Encounter (Signed)
Spoke with patient. Pt states that she woke up early morning Tuesday with her heart racing and feeling like it was in a box and someone was shaking it.  This lasted about 45 minutes.  Her heart rate monitor said she was in a fib. Yesterday about 6:30PM she had another episode of feeling like her heart was in a box and someone was shaking it. Her heart rate monitor said she was in a fib. Today at 12 noon her BP was 130/87 and heart rate 81 and did not say she was in a fib. I will forward to Dr Shirlee Latch for review.

## 2012-12-13 ENCOUNTER — Encounter: Payer: Self-pay | Admitting: *Deleted

## 2012-12-13 ENCOUNTER — Encounter (INDEPENDENT_AMBULATORY_CARE_PROVIDER_SITE_OTHER): Payer: Medicare Other

## 2012-12-13 DIAGNOSIS — I472 Ventricular tachycardia: Secondary | ICD-10-CM

## 2012-12-13 DIAGNOSIS — I4589 Other specified conduction disorders: Secondary | ICD-10-CM

## 2012-12-13 NOTE — Progress Notes (Signed)
Patient ID: Renee [name], female   DOB: 13-Aug-1945, 67 y.o.   MRN: [id number] [employer] ACT 21 day event monitor applied to patient.

## 2012-12-19 ENCOUNTER — Other Ambulatory Visit: Payer: Self-pay | Admitting: Pulmonary Disease

## 2012-12-20 NOTE — Telephone Encounter (Signed)
Monitor appointment 12/13/12 at 2:30.

## 2012-12-24 ENCOUNTER — Other Ambulatory Visit: Payer: Self-pay | Admitting: Pulmonary Disease

## 2012-12-25 ENCOUNTER — Other Ambulatory Visit: Payer: Self-pay | Admitting: Pulmonary Disease

## 2013-01-10 ENCOUNTER — Telehealth: Payer: Self-pay | Admitting: *Deleted

## 2013-01-10 NOTE — Telephone Encounter (Signed)
Monitor done 12/13/12-12/30/12 reviewed by Dr Era Skeen significant arrhythmias. Pt notified.

## 2013-01-10 NOTE — Telephone Encounter (Signed)
I called pt about monitor results and she asked me to let Dr Shirlee Latch know that she has not started taking Imdur. She has a history of migraine headaches, she has a mild one today, and she did not feel like she could tolerate Imdur. I will forward to Dr Shirlee Latch for review.

## 2013-01-10 NOTE — Telephone Encounter (Signed)
Ok, Imdur can potentially cause headache.

## 2013-02-08 ENCOUNTER — Encounter (HOSPITAL_COMMUNITY): Payer: Self-pay | Admitting: Pharmacy Technician

## 2013-02-15 ENCOUNTER — Encounter (HOSPITAL_COMMUNITY)
Admission: RE | Admit: 2013-02-15 | Discharge: 2013-02-15 | Disposition: A | Discharge status: Home / Self Care | Payer: Medicare Other | Source: Ambulatory Visit | Attending: Obstetrics & Gynecology | Admitting: Obstetrics & Gynecology

## 2013-02-15 ENCOUNTER — Encounter: Payer: Self-pay | Admitting: Pulmonary Disease

## 2013-02-15 ENCOUNTER — Encounter (HOSPITAL_COMMUNITY): Payer: Self-pay

## 2013-02-15 ENCOUNTER — Ambulatory Visit (INDEPENDENT_AMBULATORY_CARE_PROVIDER_SITE_OTHER): Payer: Medicare Other | Admitting: Pulmonary Disease

## 2013-02-15 VITALS — BP 120/82 | HR 63 | Temp 98.3°F | Ht 63.0 in | Wt 161.8 lb

## 2013-02-15 DIAGNOSIS — K219 Gastro-esophageal reflux disease without esophagitis: Secondary | ICD-10-CM

## 2013-02-15 DIAGNOSIS — Z01812 Encounter for preprocedural laboratory examination: Secondary | ICD-10-CM | POA: Insufficient documentation

## 2013-02-15 DIAGNOSIS — G894 Chronic pain syndrome: Secondary | ICD-10-CM

## 2013-02-15 DIAGNOSIS — R519 Headache, unspecified: Secondary | ICD-10-CM

## 2013-02-15 DIAGNOSIS — K5730 Diverticulosis of large intestine without perforation or abscess without bleeding: Secondary | ICD-10-CM

## 2013-02-15 DIAGNOSIS — I10 Essential (primary) hypertension: Secondary | ICD-10-CM

## 2013-02-15 DIAGNOSIS — I498 Other specified cardiac arrhythmias: Secondary | ICD-10-CM

## 2013-02-15 DIAGNOSIS — R51 Headache: Secondary | ICD-10-CM

## 2013-02-15 DIAGNOSIS — E041 Nontoxic single thyroid nodule: Secondary | ICD-10-CM

## 2013-02-15 DIAGNOSIS — Z01818 Encounter for other preprocedural examination: Secondary | ICD-10-CM | POA: Insufficient documentation

## 2013-02-15 DIAGNOSIS — F411 Generalized anxiety disorder: Secondary | ICD-10-CM

## 2013-02-15 DIAGNOSIS — M1990 Unspecified osteoarthritis, unspecified site: Secondary | ICD-10-CM

## 2013-02-15 DIAGNOSIS — I2510 Atherosclerotic heart disease of native coronary artery without angina pectoris: Secondary | ICD-10-CM

## 2013-02-15 DIAGNOSIS — IMO0001 Reserved for inherently not codable concepts without codable children: Secondary | ICD-10-CM

## 2013-02-15 DIAGNOSIS — K589 Irritable bowel syndrome without diarrhea: Secondary | ICD-10-CM

## 2013-02-15 DIAGNOSIS — H579 Unspecified disorder of eye and adnexa: Secondary | ICD-10-CM

## 2013-02-15 LAB — BASIC METABOLIC PANEL
CO2: 31 mEq/L (ref 19–32)
Chloride: 100 mEq/L (ref 96–112)
Potassium: 4.6 mEq/L (ref 3.5–5.1)
Sodium: 139 mEq/L (ref 135–145)

## 2013-02-15 LAB — CBC
Platelets: 223 10*3/uL (ref 150–400)
RBC: 4.99 MIL/uL (ref 3.87–5.11)
WBC: 7.2 10*3/uL (ref 4.0–10.5)

## 2013-02-15 NOTE — Patient Instructions (Addendum)
Today we updated your med list in our EPIC system...    Continue your current medications the same...  We will request an ASAP appt w/ [facility]- DrDohmeier's practice, for further evaluation regarding your unusual eye problem and the severe headaches.Marland KitchenMarland Kitchen
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
## 2013-02-15 NOTE — Pre-Procedure Instructions (Signed)
Dr Sherron Ales made aware of pt's cardiac history. Appt made for pt to get cardiac clearance from Dr. Myrtis Ser on 02/18/13.

## 2013-02-15 NOTE — Patient Instructions (Addendum)
Your procedure is scheduled on:02/20/13  Enter through the Main Entrance at :7am Pick up desk phone and dial [phone] and inform us of your arrival.  Please call [phone] if you have any problems the morning of surgery.  Remember: Do not eat food or drink liquids, including water, after midnight:Tuesday    You may brush your teeth the morning of surgery.  Take these meds the morning of surgery with a sip of water: Prilosec, BP med and Potassium  DO NOT wear jewelry, eye make-up, lipstick,body lotion, or dark fingernail polish.  (Polished toes are ok) You may wear deodorant.  If you are to be admitted after surgery, leave suitcase in car until your room has been assigned. Patients discharged on the day of surgery will not be allowed to drive home. Wear loose fitting, comfortable clothes for your ride home.

## 2013-02-17 ENCOUNTER — Encounter: Payer: Self-pay | Admitting: Pulmonary Disease

## 2013-02-17 NOTE — Addendum Note (Signed)
Addended by: Michele Mcalpine on: 02/17/2013 12:51 PM   Modules accepted: Level of Service

## 2013-02-18 ENCOUNTER — Ambulatory Visit (INDEPENDENT_AMBULATORY_CARE_PROVIDER_SITE_OTHER): Payer: Medicare Other | Admitting: Cardiology

## 2013-02-18 ENCOUNTER — Encounter: Payer: Self-pay | Admitting: Cardiology

## 2013-02-18 ENCOUNTER — Other Ambulatory Visit: Payer: Self-pay | Admitting: Pulmonary Disease

## 2013-02-18 VITALS — BP 140/80 | HR 70 | Ht 63.0 in | Wt 161.1 lb

## 2013-02-18 DIAGNOSIS — R079 Chest pain, unspecified: Secondary | ICD-10-CM

## 2013-02-18 DIAGNOSIS — I498 Other specified cardiac arrhythmias: Secondary | ICD-10-CM

## 2013-02-18 DIAGNOSIS — Z01810 Encounter for preprocedural cardiovascular examination: Secondary | ICD-10-CM

## 2013-02-18 MED ORDER — DILTIAZEM HCL ER COATED BEADS 120 MG PO CP24: 120.0000 mg | ORAL_CAPSULE | Freq: Every day | ORAL | Status: DC
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
## 2013-02-18 NOTE — Patient Instructions (Addendum)
Your physician recommends that you schedule a follow-up appointment in: with Dr Shirlee Latch for clearance 1-2 weeks  PT is not cleared yet for surgery  due to continued episodes of extreme fatigue  Your physician has recommended you make the following change in your medication:  Start low dose of diltiazem 120 mg once daily/ spells may be due to spasm and we will see if this makes you feel better.

## 2013-02-18 NOTE — Assessment & Plan Note (Addendum)
The purpose of the visit today was to see the patient could be cleared for GYN surgery. Since she has had recent chest pain and weak spells I feel that we cannot clear her yet. I started her on a small dose of diltiazem. She will be seen in our office in followup over the next one to 2 weeks. If she feels better I feel that she can be cleared for her surgery. We know from the past that she does not have any significant fixed coronary disease.  As part of today's evaluation I spent greater than 25 minutes with her total care. More than half of this time was spent with direct contact with her discussing all of her concerns.

## 2013-02-18 NOTE — Assessment & Plan Note (Signed)
The patient has had some additional chest discomfort. Historically her cardiac catheterization has not shown any marked disease. She had a coronary CT scan in 2013 showing no marked abnormality. The etiology of her current pain is not clear. Because of headaches she has not been taking Imdur. I considered amlodipine for the possibility of spasm. I felt that this might cause headache also. Diltiazem can be used for spasm and would have less possibility of causing headaches. Therefore I put her on a small dose of long-acting diltiazem. I am arranging for early followup with our team.

## 2013-02-18 NOTE — Assessment & Plan Note (Signed)
The patient had ablation of AV nodal reentrant tachycardia in the past. Her rhythm is sinus.

## 2013-02-20 ENCOUNTER — Encounter (HOSPITAL_COMMUNITY): Admission: RE | Payer: Self-pay | Source: Ambulatory Visit

## 2013-02-20 ENCOUNTER — Ambulatory Visit (HOSPITAL_COMMUNITY)
Admission: RE | Admit: 2013-02-20 | Payer: Medicare Other | Source: Ambulatory Visit | Admitting: Obstetrics & Gynecology

## 2013-02-20 SURGERY — DILATATION AND CURETTAGE /HYSTEROSCOPY
Anesthesia: Choice

## 2013-02-20 NOTE — Pre-Procedure Instructions (Signed)
Dr. Myrtis Ser saw pt for cardiac clearance on 02/18/13 and did NOT clear pt for surgery. Wants pt to return to office in 2 weeks and he'll decide then. Stacy in office notified and surgery cancelled for 02/20/13.

## 2013-02-21 ENCOUNTER — Other Ambulatory Visit: Payer: Self-pay | Admitting: Pulmonary Disease

## 2013-02-22 ENCOUNTER — Ambulatory Visit (INDEPENDENT_AMBULATORY_CARE_PROVIDER_SITE_OTHER): Payer: Medicare Other | Admitting: Neurology

## 2013-02-22 ENCOUNTER — Encounter: Payer: Self-pay | Admitting: Neurology

## 2013-02-22 ENCOUNTER — Telehealth: Payer: Self-pay | Admitting: Neurology

## 2013-02-22 VITALS — BP 124/77 | HR 72 | Temp 97.9°F | Ht 63.0 in | Wt 166.0 lb

## 2013-02-22 DIAGNOSIS — I2510 Atherosclerotic heart disease of native coronary artery without angina pectoris: Secondary | ICD-10-CM

## 2013-02-22 DIAGNOSIS — I10 Essential (primary) hypertension: Secondary | ICD-10-CM

## 2013-02-22 DIAGNOSIS — E041 Nontoxic single thyroid nodule: Secondary | ICD-10-CM

## 2013-02-22 DIAGNOSIS — Q898 Other specified congenital malformations: Secondary | ICD-10-CM

## 2013-02-22 DIAGNOSIS — K5909 Other constipation: Secondary | ICD-10-CM

## 2013-02-22 DIAGNOSIS — I4729 Other ventricular tachycardia: Secondary | ICD-10-CM

## 2013-02-22 DIAGNOSIS — I472 Ventricular tachycardia: Secondary | ICD-10-CM

## 2013-02-22 MED ORDER — TOPIRAMATE 25 MG PO CPSP: 50.0000 mg | ORAL_CAPSULE | Freq: Two times a day (BID) | ORAL | Status: DC

## 2013-02-22 NOTE — Telephone Encounter (Signed)
Pharmacy left message that they received a prescription for Topamax 25mg  caps.  They wanted to know if it is for ER, sprinkles or tabs.  Please advise.
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
## 2013-02-24 NOTE — Telephone Encounter (Signed)
Please let pharmacy know, it should be tablets.

## 2013-02-25 ENCOUNTER — Ambulatory Visit: Payer: Self-pay | Admitting: Neurology

## 2013-02-25 NOTE — Telephone Encounter (Signed)
Spoke to pharmacy and relayed Topamax should be tablets.

## 2013-02-26 ENCOUNTER — Telehealth: Payer: Self-pay | Admitting: Neurology

## 2013-02-27 NOTE — Telephone Encounter (Signed)
I called pt back and she stated she was not going to take the topamax due to so many SE, her cardiac hx.  She has her MRI scheduled 03-04-13 at 1100 and would wait until results back on that to decide what she wants to do.

## 2013-03-04 ENCOUNTER — Ambulatory Visit: Payer: Medicare Other | Admitting: Nurse Practitioner

## 2013-03-04 ENCOUNTER — Ambulatory Visit
Admission: RE | Admit: 2013-03-04 | Discharge: 2013-03-04 | Disposition: A | Discharge status: Home / Self Care | Payer: Medicare Other | Source: Ambulatory Visit | Attending: Neurology | Admitting: Neurology

## 2013-03-04 DIAGNOSIS — R51 Headache: Secondary | ICD-10-CM

## 2013-03-04 DIAGNOSIS — I472 Ventricular tachycardia: Secondary | ICD-10-CM

## 2013-03-04 DIAGNOSIS — K5909 Other constipation: Secondary | ICD-10-CM

## 2013-03-04 DIAGNOSIS — E041 Nontoxic single thyroid nodule: Secondary | ICD-10-CM

## 2013-03-04 DIAGNOSIS — I2510 Atherosclerotic heart disease of native coronary artery without angina pectoris: Secondary | ICD-10-CM

## 2013-03-04 DIAGNOSIS — I10 Essential (primary) hypertension: Secondary | ICD-10-CM

## 2013-03-04 DIAGNOSIS — Q898 Other specified congenital malformations: Secondary | ICD-10-CM

## 2013-03-05 ENCOUNTER — Ambulatory Visit: Payer: Self-pay | Admitting: Neurology

## 2013-03-05 NOTE — Progress Notes (Signed)
Quick Note:  Please call patient, MRI brain showed age related changes, no acute lesions. ______ 

## 2013-03-06 ENCOUNTER — Telehealth: Payer: Self-pay | Admitting: Neurology

## 2013-03-06 NOTE — Telephone Encounter (Signed)
Called patient and gave her a sooner apt.

## 2013-03-07 ENCOUNTER — Telehealth: Payer: Self-pay

## 2013-03-07 NOTE — Telephone Encounter (Signed)
I called patient to inform her of her MRI results. Patient stated she has an upcoming appointment and would rather wait until she is in front of Dr. Terrace Arabia to get the results. We reviewed that her appointment is on March 14, 2013 and again, she stated that she would rather wait.

## 2013-03-07 NOTE — Telephone Encounter (Signed)
Message copied by [facility] on Thu Mar 07, 2013  2:44 PM ------      Message from: Levert Feinstein      Created: Tue Mar 05, 2013 10:18 AM       Please call patient, MRI brain showed age related changes, no acute lesions. ------

## 2013-03-11 ENCOUNTER — Encounter: Payer: Self-pay | Admitting: Cardiology

## 2013-03-11 ENCOUNTER — Ambulatory Visit (INDEPENDENT_AMBULATORY_CARE_PROVIDER_SITE_OTHER): Payer: Medicare Other | Admitting: Cardiology

## 2013-03-11 VITALS — BP 132/84 | HR 64 | Ht 63.0 in | Wt 167.0 lb

## 2013-03-11 DIAGNOSIS — I498 Other specified cardiac arrhythmias: Secondary | ICD-10-CM

## 2013-03-11 DIAGNOSIS — G894 Chronic pain syndrome: Secondary | ICD-10-CM

## 2013-03-11 DIAGNOSIS — I472 Ventricular tachycardia: Secondary | ICD-10-CM

## 2013-03-11 DIAGNOSIS — I10 Essential (primary) hypertension: Secondary | ICD-10-CM

## 2013-03-11 DIAGNOSIS — R079 Chest pain, unspecified: Secondary | ICD-10-CM
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
## 2013-03-11 NOTE — Patient Instructions (Addendum)
Take Diltiazem CD 120mg  daily.   Your physician has requested that you have cardiac CT. Cardiac computed tomography (CT) is a painless test that uses an x-ray machine to take clear, detailed pictures of your heart. For further information please visit [email or web address] Please follow instruction sheet as given.   Your physician recommends that you schedule a follow-up appointment in: 1 month with Dr Shirlee Latch.

## 2013-03-14 ENCOUNTER — Ambulatory Visit (INDEPENDENT_AMBULATORY_CARE_PROVIDER_SITE_OTHER): Payer: Medicare Other | Admitting: Neurology

## 2013-03-14 ENCOUNTER — Encounter: Payer: Self-pay | Admitting: *Deleted

## 2013-03-14 ENCOUNTER — Encounter: Payer: Self-pay | Admitting: Neurology

## 2013-03-14 VITALS — BP 136/67 | HR 65 | Temp 98.8°F | Ht 63.0 in | Wt 164.0 lb

## 2013-03-14 DIAGNOSIS — R51 Headache: Secondary | ICD-10-CM

## 2013-03-14 DIAGNOSIS — R55 Syncope and collapse: Secondary | ICD-10-CM

## 2013-03-14 DIAGNOSIS — F411 Generalized anxiety disorder: Secondary | ICD-10-CM

## 2013-03-14 DIAGNOSIS — Z01810 Encounter for preprocedural cardiovascular examination: Secondary | ICD-10-CM

## 2013-03-14 DIAGNOSIS — M545 Low back pain: Secondary | ICD-10-CM

## 2013-03-14 DIAGNOSIS — R519 Headache, unspecified: Secondary | ICD-10-CM | POA: Insufficient documentation
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
## 2013-03-15 ENCOUNTER — Emergency Department (HOSPITAL_COMMUNITY): Payer: Medicare Other

## 2013-03-15 ENCOUNTER — Observation Stay (HOSPITAL_COMMUNITY)
Admission: EM | Admit: 2013-03-15 | Discharge: 2013-03-17 | Disposition: A | Discharge status: Home / Self Care | Payer: Medicare Other | Attending: Internal Medicine | Admitting: Internal Medicine

## 2013-03-15 ENCOUNTER — Encounter (HOSPITAL_COMMUNITY): Payer: Self-pay | Admitting: Emergency Medicine

## 2013-03-15 DIAGNOSIS — I472 Ventricular tachycardia, unspecified: Secondary | ICD-10-CM | POA: Insufficient documentation

## 2013-03-15 DIAGNOSIS — I4729 Other ventricular tachycardia: Secondary | ICD-10-CM

## 2013-03-15 DIAGNOSIS — Z7982 Long term (current) use of aspirin: Secondary | ICD-10-CM | POA: Insufficient documentation

## 2013-03-15 DIAGNOSIS — Z8639 Personal history of other endocrine, nutritional and metabolic disease: Secondary | ICD-10-CM | POA: Insufficient documentation

## 2013-03-15 DIAGNOSIS — I2510 Atherosclerotic heart disease of native coronary artery without angina pectoris: Secondary | ICD-10-CM

## 2013-03-15 DIAGNOSIS — R519 Headache, unspecified: Secondary | ICD-10-CM

## 2013-03-15 DIAGNOSIS — E162 Hypoglycemia, unspecified: Secondary | ICD-10-CM

## 2013-03-15 DIAGNOSIS — K5730 Diverticulosis of large intestine without perforation or abscess without bleeding: Secondary | ICD-10-CM

## 2013-03-15 DIAGNOSIS — K219 Gastro-esophageal reflux disease without esophagitis: Secondary | ICD-10-CM

## 2013-03-15 DIAGNOSIS — I498 Other specified cardiac arrhythmias: Secondary | ICD-10-CM

## 2013-03-15 DIAGNOSIS — Z8679 Personal history of other diseases of the circulatory system: Secondary | ICD-10-CM | POA: Insufficient documentation

## 2013-03-15 DIAGNOSIS — E041 Nontoxic single thyroid nodule: Secondary | ICD-10-CM

## 2013-03-15 DIAGNOSIS — I10 Essential (primary) hypertension: Secondary | ICD-10-CM

## 2013-03-15 DIAGNOSIS — K649 Unspecified hemorrhoids: Secondary | ICD-10-CM

## 2013-03-15 DIAGNOSIS — R1319 Other dysphagia: Secondary | ICD-10-CM

## 2013-03-15 DIAGNOSIS — K2980 Duodenitis without bleeding: Secondary | ICD-10-CM

## 2013-03-15 DIAGNOSIS — K589 Irritable bowel syndrome without diarrhea: Secondary | ICD-10-CM

## 2013-03-15 DIAGNOSIS — Z8709 Personal history of other diseases of the respiratory system: Secondary | ICD-10-CM | POA: Insufficient documentation

## 2013-03-15 DIAGNOSIS — Z8541 Personal history of malignant neoplasm of cervix uteri: Secondary | ICD-10-CM | POA: Insufficient documentation

## 2013-03-15 DIAGNOSIS — R1310 Dysphagia, unspecified: Secondary | ICD-10-CM | POA: Insufficient documentation

## 2013-03-15 DIAGNOSIS — J42 Unspecified chronic bronchitis: Secondary | ICD-10-CM

## 2013-03-15 DIAGNOSIS — R5381 Other malaise: Secondary | ICD-10-CM

## 2013-03-15 DIAGNOSIS — R51 Headache: Secondary | ICD-10-CM

## 2013-03-15 DIAGNOSIS — Z862 Personal history of diseases of the blood and blood-forming organs and certain disorders involving the immune mechanism: Secondary | ICD-10-CM | POA: Insufficient documentation

## 2013-03-15 DIAGNOSIS — J40 Bronchitis, not specified as acute or chronic: Secondary | ICD-10-CM | POA: Insufficient documentation

## 2013-03-15 DIAGNOSIS — Q898 Other specified congenital malformations: Secondary | ICD-10-CM

## 2013-03-15 DIAGNOSIS — G894 Chronic pain syndrome: Secondary | ICD-10-CM

## 2013-03-15 DIAGNOSIS — Z8719 Personal history of other diseases of the digestive system: Secondary | ICD-10-CM | POA: Insufficient documentation

## 2013-03-15 DIAGNOSIS — Z8739 Personal history of other diseases of the musculoskeletal system and connective tissue: Secondary | ICD-10-CM | POA: Insufficient documentation

## 2013-03-15 DIAGNOSIS — Z01810 Encounter for preprocedural cardiovascular examination: Secondary | ICD-10-CM

## 2013-03-15 DIAGNOSIS — R61 Generalized hyperhidrosis: Secondary | ICD-10-CM

## 2013-03-15 DIAGNOSIS — M1990 Unspecified osteoarthritis, unspecified site: Secondary | ICD-10-CM

## 2013-03-15 DIAGNOSIS — R0602 Shortness of breath: Secondary | ICD-10-CM

## 2013-03-15 DIAGNOSIS — Z8669 Personal history of other diseases of the nervous system and sense organs: Secondary | ICD-10-CM

## 2013-03-15 DIAGNOSIS — R079 Chest pain, unspecified: Principal | ICD-10-CM

## 2013-03-15 DIAGNOSIS — IMO0001 Reserved for inherently not codable concepts without codable children: Secondary | ICD-10-CM

## 2013-03-15 DIAGNOSIS — F411 Generalized anxiety disorder: Secondary | ICD-10-CM

## 2013-03-15 DIAGNOSIS — K5909 Other constipation: Secondary | ICD-10-CM

## 2013-03-15 DIAGNOSIS — M545 Low back pain, unspecified: Secondary | ICD-10-CM

## 2013-03-15 DIAGNOSIS — R55 Syncope and collapse: Secondary | ICD-10-CM

## 2013-03-15 DIAGNOSIS — Z79899 Other long term (current) drug therapy: Secondary | ICD-10-CM | POA: Insufficient documentation

## 2013-03-15 DIAGNOSIS — M549 Dorsalgia, unspecified: Secondary | ICD-10-CM | POA: Insufficient documentation

## 2013-03-15 DIAGNOSIS — M25519 Pain in unspecified shoulder: Secondary | ICD-10-CM | POA: Insufficient documentation

## 2013-03-15 DIAGNOSIS — H579 Unspecified disorder of eye and adnexa: Secondary | ICD-10-CM

## 2013-03-15 HISTORY — DX: Type 2 diabetes mellitus without complications: E11.9

## 2013-03-15 HISTORY — DX: Supraventricular tachycardia, unspecified: I47.10

## 2013-03-15 HISTORY — DX: Supraventricular tachycardia: I47.1

## 2013-03-15 LAB — BASIC METABOLIC PANEL
BUN: 11 mg/dL (ref 6–23)
CO2: 24 mEq/L (ref 19–32)
Chloride: 100 mEq/L (ref 96–112)
GFR calc Af Amer: >90 mL/min (ref >90)
GFR calc non Af Amer: 89 mL/min — ABNORMAL LOW (ref >90)
Potassium: 3.5 mEq/L (ref 3.5–5.1)
Sodium: 135 mEq/L (ref 135–145)

## 2013-03-15 LAB — CBC
HCT: 38 % (ref 36.0–46.0)
MCHC: 33.9 g/dL (ref 30.0–36.0)
Platelets: 200 10*3/uL (ref 150–400)
RBC: 4.6 MIL/uL (ref 3.87–5.11)
RDW: 12.9 % (ref 11.5–15.5)
WBC: 4.7 10*3/uL (ref 4.0–10.5)

## 2013-03-15 LAB — POCT I-STAT TROPONIN I: Troponin i, poc: 0 ng/mL (ref 0.00–0.08)

## 2013-03-15 MED ORDER — ZOLPIDEM TARTRATE 5 MG PO TABS
5.0000 mg | ORAL_TABLET | Freq: Every evening | ORAL | Status: DC | PRN
  Administered 2013-03-16: 5 mg via ORAL
  Filled 2013-03-15: qty 1

## 2013-03-15 MED ORDER — MORPHINE SULFATE 4 MG/ML IJ SOLN
4.0000 mg | Freq: Once | INTRAMUSCULAR | Status: AC
  Administered 2013-03-15: 4 mg via INTRAVENOUS
  Filled 2013-03-15: qty 1

## 2013-03-15 MED ORDER — PANTOPRAZOLE SODIUM 40 MG PO TBEC
80.0000 mg | DELAYED_RELEASE_TABLET | Freq: Every day | ORAL | Status: DC
  Administered 2013-03-15 – 2013-03-17 (×3): 80 mg via ORAL
  Filled 2013-03-15 (×3): qty 2

## 2013-03-15 MED ORDER — PANTOPRAZOLE SODIUM 40 MG PO TBEC: 40.0000 mg | DELAYED_RELEASE_TABLET | Freq: Every day | ORAL | Status: DC

## 2013-03-15 MED ORDER — DICYCLOMINE HCL 20 MG PO TABS
20.0000 mg | ORAL_TABLET | Freq: Every day | ORAL | Status: DC
  Administered 2013-03-15 – 2013-03-17 (×3): 20 mg via ORAL
  Filled 2013-03-15 (×3): qty 1

## 2013-03-15 MED ORDER — ONDANSETRON HCL 4 MG/2ML IJ SOLN
4.0000 mg | Freq: Four times a day (QID) | INTRAMUSCULAR | Status: DC | PRN
  Administered 2013-03-16: 4 mg via INTRAVENOUS
  Filled 2013-03-15 (×2): qty 2

## 2013-03-15 MED ORDER — NITROGLYCERIN 0.4 MG SL SUBL
0.4000 mg | SUBLINGUAL_TABLET | SUBLINGUAL | Status: DC | PRN
  Administered 2013-03-16: 0.4 mg via SUBLINGUAL

## 2013-03-15 MED ORDER — ENOXAPARIN SODIUM 40 MG/0.4ML ~~LOC~~ SOLN
40.0000 mg | SUBCUTANEOUS | Status: DC
  Administered 2013-03-15 – 2013-03-16 (×2): 40 mg via SUBCUTANEOUS
  Filled 2013-03-15 (×3): qty 0.4

## 2013-03-15 MED ORDER — POTASSIUM CHLORIDE ER 10 MEQ PO TBCR
10.0000 meq | EXTENDED_RELEASE_TABLET | Freq: Every day | ORAL | Status: DC
  Administered 2013-03-15 – 2013-03-17 (×3): 10 meq via ORAL
  Filled 2013-03-15 (×3): qty 1

## 2013-03-15 MED ORDER — MEDROXYPROGESTERONE ACETATE 5 MG PO TABS
5.0000 mg | ORAL_TABLET | Freq: Every day | ORAL | Status: DC
  Administered 2013-03-15 – 2013-03-17 (×3): 5 mg via ORAL
  Filled 2013-03-15 (×3): qty 1

## 2013-03-15 MED ORDER — TRIAMTERENE-HCTZ 37.5-25 MG PO CAPS
1.0000 | ORAL_CAPSULE | Freq: Every day | ORAL | Status: DC
  Administered 2013-03-15 – 2013-03-17 (×3): 1 via ORAL
  Filled 2013-03-15 (×3): qty 1

## 2013-03-15 MED ORDER — ACETAMINOPHEN 325 MG PO TABS
650.0000 mg | ORAL_TABLET | ORAL | Status: DC | PRN
  Administered 2013-03-15: 650 mg via ORAL
  Filled 2013-03-15: qty 2

## 2013-03-15 MED ORDER — ASPIRIN EC 81 MG PO TBEC
81.0000 mg | DELAYED_RELEASE_TABLET | Freq: Every day | ORAL | Status: DC
  Administered 2013-03-15 – 2013-03-17 (×3): 81 mg via ORAL
  Filled 2013-03-15 (×3): qty 1

## 2013-03-15 MED ORDER — ALPRAZOLAM 0.5 MG PO TABS
0.5000 mg | ORAL_TABLET | Freq: Two times a day (BID) | ORAL | Status: DC | PRN
  Administered 2013-03-16: 0.5 mg via ORAL
  Filled 2013-03-15 (×2): qty 1

## 2013-03-15 MED ORDER — NITROGLYCERIN 0.4 MG SL SUBL
0.4000 mg | SUBLINGUAL_TABLET | SUBLINGUAL | Status: DC | PRN
  Administered 2013-03-15: 0.4 mg via SUBLINGUAL
  Filled 2013-03-15: qty 25

## 2013-03-15 MED ORDER — AMLODIPINE BESYLATE 2.5 MG PO TABS
2.5000 mg | ORAL_TABLET | Freq: Every day | ORAL | Status: DC
  Administered 2013-03-15 – 2013-03-16 (×2): 2.5 mg via ORAL
  Filled 2013-03-15 (×2): qty 1

## 2013-03-15 MED ORDER — ESTRADIOL 2 MG PO TABS
2.0000 mg | ORAL_TABLET | Freq: Every day | ORAL | Status: DC
  Administered 2013-03-15 – 2013-03-17 (×3): 2 mg via ORAL
  Filled 2013-03-15 (×3): qty 1

## 2013-03-15 MED ORDER — IOHEXOL 350 MG/ML SOLN
80.0000 mL | Freq: Once | INTRAVENOUS | Status: AC | PRN
  Administered 2013-03-15: 80 mL via INTRAVENOUS

## 2013-03-15 MED ORDER — ONDANSETRON HCL 4 MG/2ML IJ SOLN
4.0000 mg | Freq: Once | INTRAMUSCULAR | Status: AC
  Administered 2013-03-15: 4 mg via INTRAVENOUS
  Filled 2013-03-15: qty 2

## 2013-03-15 NOTE — ED Notes (Signed)
Pt returned from xray

## 2013-03-15 NOTE — ED Provider Notes (Signed)
CSN: [id number]     Arrival date & time 03/15/13  4098 History   First MD Initiated Contact with Patient 03/15/13 [id number]     Chief Complaint  Patient presents with  . Chest Pain   (Consider location/radiation/quality/duration/timing/severity/associated sxs/prior Treatment) HPI Comments: Patient presents to the ER for evaluation of right shoulder and back pain as well as chest pain. Patient reports that she has a history of angina as well as tachycardia requiring radioablation. Patient reports that this month she has had 3 episodes of significant angina requiring multiple nitroglycerin for resolution. Today's pain is the fourth time she has had pain in one month. Patient reports that she's been having pain in the right shoulder posteriorly. This started suddenly yesterday afternoon. Pain radiates down the arm to the elbow. It is not exacerbated by movement of the arm or neck. Patient reports that she has been having intermittent heaviness in the center of her chest with this as well. Pain in the shoulder and arm has waxed and waned but never completely gone away. It has improved with nitroglycerin, however.   Past Medical History  Diagnosis Date  . Bronchitis     Recurrent  . Hypertension   . Coronary artery disease     Mild non obstructive plaque cath 2005, NL coronaries with calcium score of 0 2013  . AV nodal re-entry tachycardia     s/p RFA 1993- Dr. Delena Serve, [facility]  . Long QT interval   . GERD (gastroesophageal reflux disease)   . Dysphagia   . Duodenitis without hemorrhage   . Diverticulosis of colon   . Irritable bowel syndrome   . Constipation, chronic   . Hemorrhoids   . Fecal incontinence   . Thyroid nodule   . Degenerative joint disease   . Lumbar back pain   . Fibromyalgia   . Chronic pain syndrome   . Neuropathy   . Anxiety   . Pre-syncope   . Complication of anesthesia     " slow to awake prolong QT "  . Recurrent upper respiratory infection (URI)   . Cancer     hx  of cervical   ca  . Chronic chest pain   . NSVT (nonsustained ventricular tachycardia)     Recent OP event monitor  . Osteoporosis   . Palpitations    Past Surgical History  Procedure Laterality Date  . Biopsy thyroid    . Total hip arthroplasty  10/10    Left, at [facility]  . Total hip arthroplasty  10/10    Left, at [facility]  . Hysteroscopy  2002    with resection of endometrial polyps. by Dr.MCPhail  . Coronary angioplasty  2007  . Cardiac electrophysiology study and ablation      atrioventricular nodal reentant tachycardia  . Ablation of dysrhythmic focus  1993  . Cholecystectomy  1980  . Orif radial head / neck fracture  09/05/88   Family History  Problem Relation Age of Onset  . Ovarian cancer Mother   . Stroke Mother   . Diabetes Mother   . Asthma Mother   . Diabetes Sister     Pacemaker, CHF  . Heart disease Sister   . Asthma Sister   . Lung cancer Father     Heart problems  . Heart disease Sister   . Diabetes Brother    History  Substance Use Topics  . Smoking status: Never Smoker   . Smokeless tobacco: Never Used  . Alcohol Use:  Yes     Comment: Occasional    OB History   Grav Para Term Preterm Abortions TAB SAB Ect Mult Living                 Review of Systems  Cardiovascular: Positive for chest pain.  Musculoskeletal: Positive for back pain.  All other systems reviewed and are negative.    Allergies  Moxifloxacin  Home Medications   Current Outpatient Rx  Name  Route  Sig  Dispense  Refill  . ALPRAZolam (XANAX) 0.5 MG tablet   Oral   Take 0.5 mg by mouth 2 (two) times daily as needed for anxiety.         Marland Kitchen aspirin EC 81 MG tablet   Oral   Take 81 mg by mouth every morning.          . dicyclomine (BENTYL) 20 MG tablet   Oral   Take 20 mg by mouth every morning.          . diltiazem (CARDIZEM CD) 120 MG 24 hr capsule   Oral   Take 1 capsule (120 mg total) by mouth daily.   30 capsule   1   . estradiol  (ESTRACE) 2 MG tablet   Oral   Take 1 tablet by mouth daily.         Marland Kitchen KLOR-CON M10 10 MEQ tablet      TAKE ONE TABLET BY MOUTH EVERY DAY   30 tablet   1   . medroxyPROGESTERone (PROVERA) 5 MG tablet   Oral   Take 5 mg by mouth daily.         . naproxen sodium (ANAPROX) 220 MG tablet   Oral   Take 440 mg by mouth daily as needed.         . nitroGLYCERIN (NITROSTAT) 0.4 MG SL tablet   Sublingual   Place 0.4 mg under the tongue every 5 (five) minutes as needed for chest pain. For chest pain         . omeprazole (PRILOSEC) 40 MG capsule   Oral   Take 40 mg by mouth every morning.          . potassium chloride (K-DUR) 10 MEQ tablet   Oral   Take 10 mEq by mouth every morning.         . triamterene-hydrochlorothiazide (DYAZIDE) 37.5-25 MG per capsule   Oral   Take 1 each (1 capsule total) by mouth every morning.   30 capsule   4   . zolpidem (AMBIEN) 10 MG tablet   Oral   Take 5-10 mg by mouth at bedtime as needed for sleep.          BP 129/72  Pulse 67  Temp(Src) 98.5 F (36.9 C) (Oral)  Resp 11  SpO2 98% Physical Exam  Constitutional: She is oriented to person, place, and time. She appears well-developed and well-nourished. No distress.  HENT:  Head: Normocephalic and atraumatic.  Right Ear: Hearing normal.  Left Ear: Hearing normal.  Nose: Nose normal.  Mouth/Throat: Oropharynx is clear and moist and mucous membranes are normal.  Eyes: Conjunctivae and EOM are normal. Pupils are equal, round, and reactive to light.  Neck: Normal range of motion. Neck supple.  Cardiovascular: Regular rhythm, S1 normal and S2 normal.  Exam reveals no gallop and no friction rub.   No murmur heard. Pulmonary/Chest: Effort normal and breath sounds normal. No respiratory distress. She exhibits no tenderness.  Abdominal: Soft. Normal  appearance and bowel sounds are normal. There is no hepatosplenomegaly. There is no tenderness. There is no rebound, no guarding, no  tenderness at McBurney's point and negative Murphy's sign. No hernia.  Musculoskeletal: Normal range of motion.  Neurological: She is alert and oriented to person, place, and time. She has normal strength. No cranial nerve deficit or sensory deficit. Coordination normal. GCS eye subscore is 4. GCS verbal subscore is 5. GCS motor subscore is 6.  Skin: Skin is warm, dry and intact. No rash noted. No cyanosis.  Psychiatric: She has a normal mood and affect. Her speech is normal and behavior is normal. Thought content normal.    ED Course  Procedures (including critical care time)  EKG:  Date: 03/15/2013  Rate: 67  Rhythm: normal sinus rhythm  QRS Axis: normal  Intervals: normal  ST/T Wave abnormalities: nonspecific T wave changes  Conduction Disutrbances:none  Narrative Interpretation: flat and inverted T waves anterior leads, present on previous EKG  Old EKG Reviewed: unchanged   Labs Review Labs Reviewed  BASIC METABOLIC PANEL - Abnormal; Notable for the following:    GFR calc non Af Amer 89 (*)    All other components within normal limits  CBC  POCT I-STAT TROPONIN I   Imaging Review Dg Chest 2 View  03/15/2013   CLINICAL DATA:  Chest pain and hypertension  EXAM: CHEST  2 VIEW  COMPARISON:  November 13, 2012  FINDINGS: Lungs are clear. Heart size and pulmonary vascularity are normal. No adenopathy. No bone lesions beyond old injury to the right clavicle which appears stable. There are surgical clips in the right upper abdomen.  IMPRESSION: No edema or consolidation.   Electronically Signed   By: Bretta Bang M.D.   On: 03/15/2013 09:44   Ct Angio Chest Aortic Dissect W &/or W/o  03/15/2013   CLINICAL DATA:  Right scapular and back pain, shortness of breath.  EXAM: CT ANGIOGRAPHY CHEST WITH CONTRAST  TECHNIQUE: Multidetector CT imaging of the chest was performed using the standard protocol during bolus administration of intravenous contrast. Multiplanar CT image reconstructions  including MIPs were obtained to evaluate the vascular anatomy.  CONTRAST:  80mL OMNIPAQUE IOHEXOL 350 MG/ML SOLN  COMPARISON:  10/10/2011 and earlier studies  FINDINGS: The non contrast scan shows no hyperdense crescent or mediastinal hematoma.  CTA shows thoracic aorta normal in caliber. No dissection, aneurysm, or stenosis. Mild atheromatous plaque through the descending segment. Classic 3 vessel brachiocephalic arterial origin anatomy without proximal stenosis. There is good contrast opacification of pulmonary arterial branches with no definite filling defects to suggest acute PE. No pleural or pericardial effusion. No hilar or mediastinal adenopathy. Dependent linear scarring/ atelectasis and ground-glass opacities posteriorly in both lower lobes. Lungs otherwise clear. Minimal spurring in the mid thoracic spine. No other bone abnormalities. There several low-attenuation liver lesions probably cysts, some seen on studies dating back to 07/16/2003. Surgical clips in the gallbladder fossa. Remainder visualized upper abdomen unremarkable.  Review of the MIP images confirms the above findings.  IMPRESSION: 1. Negative for aortic dissection, aneurysm, pulmonary embolus, or other acute abnormality.   Electronically Signed   By: Oley Balm M.D.   On: 03/15/2013 11:02    MDM  Diagnosis: 1. Chest pain 2. Right shoulder pain  Patient presents to the ER for evaluation of pain in the right posterior shoulder area. She reports that the pain suddenly started yesterday and has been waxing and waning since. Active and passive range of motion at the  shoulder does not exacerbate the patient here in the ER. She is able to move her neck through range of motion without worsening of the pain either. This does not seem consistent with cervical radiculopathy or musculoskeletal shoulder pain at this time. Patient has had concomitant anterior substernal chest discomfort as well. Reports that she has a history of angina and has  been having increased symptoms for one month. Doctor McLean's most recent records indicate that the patient is felt to possibly have microvascular angina versus vasospasm. She did not get any improvement with nitroglycerin here in the ER, was given morphine. Will consult her cardiologist for further recommendations.  Gilda Crease, MD 03/18/13 1314

## 2013-03-15 NOTE — ED Notes (Addendum)
Pt reports she developed pain in right shoulder while at the doctors office yesterday at 1:15pm. States pain went down right arm and elbow. Pt reports tightening in center of chest when having pain in right arm. Pt alert, oriented x4, speaking in full sentences. Received 324mg  ASA PTA, 1 nitro with some relief. 18g LFA
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
## 2013-03-15 NOTE — Progress Notes (Signed)
Utilization review completed.  

## 2013-03-15 NOTE — ED Notes (Signed)
Pt given diet ginger ale and heart healthy diet tray ordered for patient.

## 2013-03-15 NOTE — ED Notes (Signed)
Cardiology at bedside with patient

## 2013-03-16 DIAGNOSIS — G894 Chronic pain syndrome: Secondary | ICD-10-CM

## 2013-03-16 LAB — TROPONIN I: Troponin I: <0.3 ng/mL (ref <0.30)

## 2013-03-16 MED ORDER — ONDANSETRON HCL 4 MG/2ML IJ SOLN
4.0000 mg | Freq: Once | INTRAMUSCULAR | Status: AC
  Administered 2013-03-16: 4 mg via INTRAVENOUS

## 2013-03-16 MED ORDER — AMLODIPINE BESYLATE 5 MG PO TABS
5.0000 mg | ORAL_TABLET | Freq: Every day | ORAL | Status: DC
  Administered 2013-03-17: 5 mg via ORAL
  Filled 2013-03-16: qty 1

## 2013-03-16 NOTE — Progress Notes (Signed)
Pt called c/o c/p b/p 135/82 HR 67, 100% R/A. Pt placed on N/C at 4L O2, nitro given, EKG done with no changes. Pt reports pain gone after 1st nitro

## 2013-03-16 NOTE — Progress Notes (Signed)
Pt c/o feeling weak, "I don't feel like doing anything" not even lifting my head off the pillow. This is what it is like when I have one of these spells and I have to hire a sitter to take care of my mom because I can't even get out of bed" They need to figure out what is causing this."  Pt denies pain, SOB,CP,no diaphoresis,no cramps, no soreness, no photophobia Orthostats done negative. Bailey Mech notified per pt. Request. Explained to pt these are vague symptoms, however she is unable to provide further info

## 2013-03-16 NOTE — Progress Notes (Signed)
SUBJECTIVE:  SHe had a bad episode of chest pain this am.  Also still complaining of weak spells but telemetry with no arrhythmias.  OBJECTIVE:   Vitals:   Filed Vitals:   03/15/13 1602 03/15/13 2046 03/16/13 0526 03/16/13 1046  BP: 126/57 111/70 123/70 135/82  Pulse: 67 67 69   Temp: 98 F (36.7 C) 98 F (36.7 C) 98.1 F (36.7 C)   TempSrc: Oral Oral    Resp: 18 18 20    Weight:   163 lb 3.2 oz (74.027 kg)   SpO2: 100% 96% 92%    I&O's:   Intake/Output Summary (Last 24 hours) at 03/16/13 1136 Last data filed at 03/16/13 1610  Gross per 24 hour  Intake    720 ml  Output      0 ml  Net    720 ml   TELEMETRY: Reviewed telemetry pt in NSR     PHYSICAL EXAM General: Well developed, well nourished, in no acute distress Head: Eyes PERRLA, No xanthomas.   Normal cephalic and atramatic  Lungs:   Clear bilaterally to auscultation and percussion. Heart:   HRRR S1 S2 Pulses are 2+ & equal. Abdomen: Bowel sounds are positive, abdomen soft and non-tender without masses  Extremities:   No clubbing, cyanosis or edema.  DP +1 Neuro: Alert and oriented X 3. Psych:  Good affect, responds appropriately   LABS: Basic Metabolic Panel:  Recent Labs  96/04/54 0918  NA 135  K 3.5  CL 100  CO2 24  GLUCOSE 99  BUN 11  CREATININE 0.66  CALCIUM 9.4   Liver Function Tests: No results found for this basename: AST, ALT, ALKPHOS, BILITOT, PROT, ALBUMIN,  in the last 72 hours No results found for this basename: LIPASE, AMYLASE,  in the last 72 hours CBC:  Recent Labs  03/15/13 0918  WBC 4.7  HGB 12.9  HCT 38.0  MCV 82.6  PLT 200   Cardiac Enzymes:  Recent Labs  03/15/13 1710 03/15/13 2245 03/16/13 0530  TROPONINI <0.30 <0.30 <0.30     RADIOLOGY: Dg Chest 2 View  03/15/2013   CLINICAL DATA:  Chest pain and hypertension  EXAM: CHEST  2 VIEW  COMPARISON:  November 13, 2012  FINDINGS: Lungs are clear. Heart size and pulmonary vascularity are normal. No adenopathy. No bone  lesions beyond old injury to the right clavicle which appears stable. There are surgical clips in the right upper abdomen.  IMPRESSION: No edema or consolidation.   Electronically Signed   By: Bretta Bang M.D.   On: 03/15/2013 09:44   Mr Brain Wo Contrast  03/05/2013   [facility]  NEUROIMAGING REPORT   STUDY DATE: 03/04/13 PATIENT NAME: Renee [name] DOB: [birth year] MRN: [id number]  ORDERING CLINICIAN: Levert Feinstein, MD  CLINICAL HISTORY: 67 year old female with headaches.  EXAM: MRI brain (without)  TECHNIQUE: MRI of the brain without contrast was obtained utilizing 5 mm  axial slices with T1, T2, T2 flair, SWI and diffusion weighted views.  T1  sagittal and T2 coronal views were obtained. CONTRAST: no IMAGING SITE: [facility] [address] (1.5 Tesla MRI)    FINDINGS:  No abnormal lesions are seen on diffusion-weighted views to suggest acute  ischemia. The cortical sulci, fissures and cisterns are normal in size and  appearance. Lateral, third and fourth ventricle are normal in size and  appearance. No extra-axial fluid collections are seen. No evidence of mass  effect or midline shift.  Scattered  periventricular and subcortical foci  of T2 hyperintensities, mainly in the right parietal and left peri-atrial  regions but also scattered throughout.  On sagittal views the posterior fossa, pituitary gland and corpus callosum  are unremarkable. No evidence of intracranial hemorrhage on SWI views. The  orbits and their contents, paranasal sinuses and calvarium are  non-specific fluid/inflammation in the right mastoid air cells.  Intracranial flow voids are present.   03/05/2013   Mildly abnormal MRI brain (without) demonstrating: 1. Mild scattered periventricular and subcortical foci of T2  hyperintensities. These findings are non-specific and considerations  include autoimmune, inflammatory, post-infectious, microvascular ischemic  or migraine associated etiologies.  2.  Non-specific fluid/inflammation in the right mastoid air cells. 3. No acute findings.     INTERPRETING PHYSICIAN:  Suanne Marker, MD Certified in Neurology, Neurophysiology and Neuroimaging  [facility] [address] [place], [place] [address] [phone]   Ct Angio Chest Aortic Dissect W &/or W/o  03/15/2013   CLINICAL DATA:  Right scapular and back pain, shortness of breath.  EXAM: CT ANGIOGRAPHY CHEST WITH CONTRAST  TECHNIQUE: Multidetector CT imaging of the chest was performed using the standard protocol during bolus administration of intravenous contrast. Multiplanar CT image reconstructions including MIPs were obtained to evaluate the vascular anatomy.  CONTRAST:  80mL OMNIPAQUE IOHEXOL 350 MG/ML SOLN  COMPARISON:  10/10/2011 and earlier studies  FINDINGS: The non contrast scan shows no hyperdense crescent or mediastinal hematoma.  CTA shows thoracic aorta normal in caliber. No dissection, aneurysm, or stenosis. Mild atheromatous plaque through the descending segment. Classic 3 vessel brachiocephalic arterial origin anatomy without proximal stenosis. There is good contrast opacification of pulmonary arterial branches with no definite filling defects to suggest acute PE. No pleural or pericardial effusion. No hilar or mediastinal adenopathy. Dependent linear scarring/ atelectasis and ground-glass opacities posteriorly in both lower lobes. Lungs otherwise clear. Minimal spurring in the mid thoracic spine. No other bone abnormalities. There several low-attenuation liver lesions probably cysts, some seen on studies dating back to 07/16/2003. Surgical clips in the gallbladder fossa. Remainder visualized upper abdomen unremarkable.  Review of the MIP images confirms the above findings.  IMPRESSION: 1. Negative for aortic dissection, aneurysm, pulmonary embolus, or other acute abnormality.   Electronically Signed   By: Oley Balm M.D.   On: 03/15/2013 11:02    ASSESSMENT  AND PLAN  1. Chest pain: Patient presents with recurrent acute on chronic chest pain and persisted despite nitrate therapy at home. Symptoms resolved with morphine here in the ED. Initial ECG performed by EMS did show anterolateral ST depression with resolution of his depression and subsequent ECGs. Despite prolonged symptoms, her initial troponin is normal. The CT angiogram for chest was performed and was negative for dissection, aneurysm, or pulmonary embolism. [name] had prior workups of chest pain with 3 catheterizations, the last 2005, and also cardiac CT angiography in April 2013, all of which were normal. She had a calcium score of 0 last April. We will observe her overnight and continue to cycle enzymes. As there has been discussion in the past that her symptoms may be secondary to coronary vasospasm or microvascular angina, she was switched  from diltiazem to amlodipine. She has ruled out for MI by serial cardiac markers but had recurrent severe CP this am.  Her BP is stable so I will increase her amlodipine to 5mg  daily for more antianginal therapy.  I will not send her home today since her CP is not adequately  controlled. 2. Weak spells: Patient has a long history of sudden weak spells occurring up to several times per day. She does not experience syncope or presyncope. [name] been evaluated with monitors in the past and she says that during the monitoring period, she has not had any spells. She is now interested in implantable loop recorder placement and we should arrange outpatient followup with Dr. Ladona Ridgel following discharge.   3. Anxiety: Patient admits to stress related to caring for her 27 year old mother. She does not feel that her symptoms are in any way related to anxiety however.   4. Mildly prolonged QT: Avoid QT prolonging drugs.    Quintella Reichert, MD  03/16/2013  11:36 AM

## 2013-03-16 NOTE — Progress Notes (Signed)
03/15/13 Pt.is A/Ox4 and is ambulatory without assistance. IV dressing was changed and IV flushed. Pt.had c/o nausea and prn Zofran was given at 0116. Zofran 4mg  IV is ordered q6hrs prn. Patient c/o nausea again around 0530 so MD on call was called and notified. One time order was given for IV Zofran and given at 0539.

## 2013-03-16 NOTE — Progress Notes (Signed)
Pt reports she does not feel well this morning with generalized complaints of weakness and nausea during the night. Reports how hard it has been to care for her 36yr, old mom full time over the past 2 yrs and a total of 5 yrs caring for her and how exhausting this has been from 6am -7p each day.

## 2013-03-17 DIAGNOSIS — R5381 Other malaise: Secondary | ICD-10-CM

## 2013-03-17 MED ORDER — AMLODIPINE BESYLATE 5 MG PO TABS: 5.0000 mg | ORAL_TABLET | Freq: Every day | ORAL | Status: DC

## 2013-03-17 MED ORDER — PANTOPRAZOLE SODIUM 40 MG PO TBEC: 80.0000 mg | DELAYED_RELEASE_TABLET | Freq: Every day | ORAL | Status: DC

## 2013-03-17 MED ORDER — DIPHENHYDRAMINE HCL 50 MG/ML IJ SOLN
25.0000 mg | Freq: Four times a day (QID) | INTRAMUSCULAR | Status: DC | PRN
  Administered 2013-03-17: 25 mg via INTRAVENOUS
  Filled 2013-03-17: qty 1

## 2013-03-17 MED ORDER — ZOLPIDEM TARTRATE 5 MG PO TABS: 5.0000 mg | ORAL_TABLET | Freq: Every evening | ORAL | Status: DC | PRN

## 2013-03-17 MED ORDER — PNEUMOCOCCAL VAC POLYVALENT 25 MCG/0.5ML IJ INJ: 0.5000 mL | INJECTION | INTRAMUSCULAR | Status: DC

## 2013-03-17 NOTE — Discharge Summary (Signed)
Physician Discharge Summary  Patient ID: Renee [name] MRN: [id number] DOB/AGE: 1946-01-27 67 y.o.  Admit date: 03/15/2013 Discharge date: 03/17/2013  Primary Discharge Diagnosis: 1. Noncardiac chest pain 2. Anxiety with weakness.   Secondary Discharge Diagnosis 1. Mildly prolonged QT  Significant Diagnostic Studies: None  Hospital Course: Renee [name] is a 67 year old patient normally followed by Dr. Shirlee Latch and Dr. Ladona Ridgel who was admitted via the emergency room with complaints of chest pain with cor previously normal coronary evaluations. She has a history of mildly prolonged QT interval, most recent cath in 2005 with nonobstructive disease. She was very anxious and tearful. The patient was admitted to rule out cardiac etiology of chest discomfort. Been discussions in the past and the patient was having coronary spasms. She was changed to amlodipine. Cardiac enzymes are found be negative. CT scan was negative were PE or aneurysmal dissection.   She was seen and examined by Dr. Peter Swaziland and found to be stable for discharge. Per Dr. Elvis Coil note, the patient was is always morphine. He had an extensive discussion with the patient and found that the symptoms for multiple nonspecific and related to anxiety. The patient admitted that she is under extreme amount of stress related to caring for her 56 year old mother. However, she is reluctant to admit that the chest pain is related to anxiety. She is to followup with Dr. Shirlee Latch and Dr. Ladona Ridgel post discharge for close evaluation.   Discharge Exam: Blood pressure 127/83, pulse 67, temperature 98.2 F (36.8 C), temperature source Oral, resp. rate 20, weight 165 lb 5.5 oz (75 kg), SpO2 99.00%.   Labs:   Lab Results  Component Value Date   WBC 4.7 03/15/2013   HGB 12.9 03/15/2013   HCT 38.0 03/15/2013   MCV 82.6 03/15/2013   PLT 200 03/15/2013     Recent Labs Lab 03/15/13 0918  NA 135  K 3.5  CL 100  CO2 24  BUN 11  CREATININE 0.66    CALCIUM 9.4  GLUCOSE 99   Lab Results  Component Value Date   CKTOTAL 37 10/08/2011   CKMB 1.7 10/08/2011   TROPONINI <0.30 03/16/2013        Radiology: Dg Chest 2 View  03/15/2013   CLINICAL DATA:  Chest pain and hypertension  EXAM: CHEST  2 VIEW  COMPARISON:  November 13, 2012  FINDINGS: Lungs are clear. Heart size and pulmonary vascularity are normal. No adenopathy. No bone lesions beyond old injury to the right clavicle which appears stable. There are surgical clips in the right upper abdomen.  IMPRESSION: No edema or consolidation.   Electronically Signed   By: Bretta Bang M.D.   On: 03/15/2013 09:44   Mr Brain Wo Contrast  03/05/2013   [facility]  NEUROIMAGING REPORT   STUDY DATE: 03/04/13 PATIENT NAME: Renee [name] DOB: 07/02/1945 MRN: [id number]  ORDERING CLINICIAN: Levert Feinstein, MD  CLINICAL HISTORY: 67 year old female with headaches.  EXAM: MRI brain (without)    03/05/2013   Mildly abnormal MRI brain (without) demonstrating: 1. Mild scattered periventricular and subcortical foci of T2  hyperintensities. These findings are non-specific and considerations  include autoimmune, inflammatory, post-infectious, microvascular ischemic  or migraine associated etiologies.  2. Non-specific fluid/inflammation in the right mastoid air cells. 3. No acute findings.     INTERPRETING PHYSICIAN:  Suanne Marker, MD Certified in Neurology, Neurophysiology and Neuroimaging  [facility] [address], [address] [place], [place] [address] [phone]  Ct Angio Chest Aortic Dissect W &/or W/o  03/15/2013   CLINICAL DATA:  Right scapular and back pain, shortness of breath.  EXAM: CT ANGIOGRAPHY CHEST WITH CONTRAST  IMPRESSION: 1. Negative for aortic dissection, aneurysm, pulmonary embolus, or other acute abnormality.   Electronically Signed   By: Oley Balm M.D.   On: 03/15/2013 11:02    EKG: Normal sinus rhythm rate of 68 beats per minute with nonspecific ST-T  wave changes.  FOLLOW UP PLANS AND APPOINTMENTS     Discharge Orders   Future Appointments Provider Department Dept Phone   03/27/2013 2:00 PM [facility] CT IMAGING [phone]   Patient to arrive 1 hour prior to appointment time. No solid food 4 hours prior to exam. Liquids and Medicines are okay. No caffeine, smoking, or sexual enhancement herbs or meds 72 hours prior to exam.   04/04/2013 2:00 PM Laurey Morale, MD [facility] [place] Office [phone]   02/18/2014 11:00 AM Michele Mcalpine, MD [facility] [phone]   Future Orders Complete By Expires   Diet - low sodium heart healthy  As directed    Increase activity slowly  As directed        Medication List    STOP taking these medications       diltiazem 120 MG 24 hr capsule  Commonly known as:  CARDIZEM CD     omeprazole 40 MG capsule  Commonly known as:  PRILOSEC  Replaced by:  pantoprazole 40 MG tablet      TAKE these medications       ALPRAZolam 0.5 MG tablet  Commonly known as:  XANAX  Take 0.5 mg by mouth 2 (two) times daily as needed for anxiety.     amLODipine 5 MG tablet  Commonly known as:  NORVASC  Take 1 tablet (5 mg total) by mouth daily.     aspirin EC 81 MG tablet  Take 81 mg by mouth every morning.     dicyclomine 20 MG tablet  Commonly known as:  BENTYL  Take 20 mg by mouth every morning.     estradiol 2 MG tablet  Commonly known as:  ESTRACE  Take 1 tablet by mouth daily.     medroxyPROGESTERone 5 MG tablet  Commonly known as:  PROVERA  Take 5 mg by mouth daily.     naproxen sodium 220 MG tablet  Commonly known as:  ANAPROX  Take 440 mg by mouth daily as needed.     nitroGLYCERIN 0.4 MG SL tablet  Commonly known as:  NITROSTAT  Place 0.4 mg under the tongue every 5 (five) minutes as needed for chest pain. For chest pain     pantoprazole 40 MG tablet  Commonly known as:  PROTONIX  Take 2 tablets (80 mg total) by mouth daily.      potassium chloride 10 MEQ tablet  Commonly known as:  K-DUR  Take 10 mEq by mouth daily.     triamterene-hydrochlorothiazide 37.5-25 MG per capsule  Commonly known as:  DYAZIDE  Take 1 each (1 capsule total) by mouth every morning.     zolpidem 5 MG tablet  Commonly known as:  AMBIEN  Take 1 tablet (5 mg total) by mouth at bedtime as needed for sleep.       Follow-up Information   Follow up with Lewayne Bunting, MD. (Our office will call you for appt.)    Specialty:  Cardiology   Contact information:  [address] [place] [address] [phone]       Follow up with Marca Ancona, MD. (Our office will call you for appt)    Specialty:  Cardiology   Contact information:   [address] [place] [address] [phone]       Time spent with patient to include physician time:40 minutes  Joni Reining 03/17/2013, 12:27 PM Co-Sign MD

## 2013-03-17 NOTE — Discharge Summary (Signed)
Patient seen and examined and history reviewed. Agree with above findings and plan. Please see my earlier rounding note.  Theron Arista JordanMD 03/17/2013 9:07 PM

## 2013-03-17 NOTE — Progress Notes (Signed)
SUBJECTIVE:  No further chest pain. Feels pretty good this am but very frustrated that we can't find out what is wrong with her.   OBJECTIVE:   Vitals:   Filed Vitals:   03/16/13 1552 03/16/13 2116 03/17/13 0539 03/17/13 1100  BP:  113/62 131/60 127/83  Pulse: 65 62 67   Temp:  97.9 F (36.6 C) 98.2 F (36.8 C)   TempSrc:  Oral Oral   Resp:      Weight:   165 lb 5.5 oz (75 kg)   SpO2:  100% 99%    I&O's:    Intake/Output Summary (Last 24 hours) at 03/17/13 1148 Last data filed at 03/17/13 1000  Gross per 24 hour  Intake   1360 ml  Output      4 ml  Net   1356 ml   TELEMETRY: Reviewed telemetry pt in NSR  PHYSICAL EXAM General: Well developed, well nourished, in no acute distress Head: Eyes PERRLA, No xanthomas.   Normal cephalic and atramatic  Lungs:   Clear bilaterally to auscultation and percussion. Heart:   HRRR S1 S2 Pulses are 2+ & equal. Abdomen: Bowel sounds are positive, abdomen soft and non-tender without masses  Extremities:   No clubbing, cyanosis or edema.  DP +1 Neuro: Alert and oriented X 3. Psych:  Good affect, responds appropriately   LABS: Basic Metabolic Panel:  Recent Labs  40/98/11 0918  NA 135  K 3.5  CL 100  CO2 24  GLUCOSE 99  BUN 11  CREATININE 0.66  CALCIUM 9.4   CBC:  Recent Labs  03/15/13 0918  WBC 4.7  HGB 12.9  HCT 38.0  MCV 82.6  PLT 200   Cardiac Enzymes:  Recent Labs  03/15/13 1710 03/15/13 2245 03/16/13 0530  TROPONINI <0.30 <0.30 <0.30     RADIOLOGY: Dg Chest 2 View  03/15/2013   CLINICAL DATA:  Chest pain and hypertension  EXAM: CHEST  2 VIEW  COMPARISON:  November 13, 2012  FINDINGS: Lungs are clear. Heart size and pulmonary vascularity are normal. No adenopathy. No bone lesions beyond old injury to the right clavicle which appears stable. There are surgical clips in the right upper abdomen.  IMPRESSION: No edema or consolidation.   Electronically Signed   By: Bretta Bang M.D.   On: 03/15/2013 09:44    Mr Brain Wo Contrast  03/05/2013   [facility]  NEUROIMAGING REPORT   STUDY DATE: 03/04/13 PATIENT NAME: ASHELEY HELLBERG DOB: December 02, 1945 MRN: [id number]  ORDERING CLINICIAN: Levert Feinstein, MD  CLINICAL HISTORY: 67 year old female with headaches.  EXAM: MRI brain (without)  TECHNIQUE: MRI of the brain without contrast was obtained utilizing 5 mm  axial slices with T1, T2, T2 flair, SWI and diffusion weighted views.  T1  sagittal and T2 coronal views were obtained. CONTRAST: no IMAGING SITE: [facility] [address] (1.5 Tesla MRI)    FINDINGS:  No abnormal lesions are seen on diffusion-weighted views to suggest acute  ischemia. The cortical sulci, fissures and cisterns are normal in size and  appearance. Lateral, third and fourth ventricle are normal in size and  appearance. No extra-axial fluid collections are seen. No evidence of mass  effect or midline shift.  Scattered periventricular and subcortical foci  of T2 hyperintensities, mainly in the right parietal and left peri-atrial  regions but also scattered throughout.  On sagittal views the posterior fossa, pituitary gland and corpus callosum  are unremarkable. No evidence of intracranial  hemorrhage on SWI views. The  orbits and their contents, paranasal sinuses and calvarium are  non-specific fluid/inflammation in the right mastoid air cells.  Intracranial flow voids are present.   03/05/2013   Mildly abnormal MRI brain (without) demonstrating: 1. Mild scattered periventricular and subcortical foci of T2  hyperintensities. These findings are non-specific and considerations  include autoimmune, inflammatory, post-infectious, microvascular ischemic  or migraine associated etiologies.  2. Non-specific fluid/inflammation in the right mastoid air cells. 3. No acute findings.     INTERPRETING PHYSICIAN:  Suanne Marker, MD Certified in Neurology, Neurophysiology and Neuroimaging  [facility] [address]  [address] [place], [place] [address] [phone]   Ct Angio Chest Aortic Dissect W &/or W/o  03/15/2013   CLINICAL DATA:  Right scapular and back pain, shortness of breath.  EXAM: CT ANGIOGRAPHY CHEST WITH CONTRAST  TECHNIQUE: Multidetector CT imaging of the chest was performed using the standard protocol during bolus administration of intravenous contrast. Multiplanar CT image reconstructions including MIPs were obtained to evaluate the vascular anatomy.  CONTRAST:  80mL OMNIPAQUE IOHEXOL 350 MG/ML SOLN  COMPARISON:  10/10/2011 and earlier studies  FINDINGS: The non contrast scan shows no hyperdense crescent or mediastinal hematoma.  CTA shows thoracic aorta normal in caliber. No dissection, aneurysm, or stenosis. Mild atheromatous plaque through the descending segment. Classic 3 vessel brachiocephalic arterial origin anatomy without proximal stenosis. There is good contrast opacification of pulmonary arterial branches with no definite filling defects to suggest acute PE. No pleural or pericardial effusion. No hilar or mediastinal adenopathy. Dependent linear scarring/ atelectasis and ground-glass opacities posteriorly in both lower lobes. Lungs otherwise clear. Minimal spurring in the mid thoracic spine. No other bone abnormalities. There several low-attenuation liver lesions probably cysts, some seen on studies dating back to 07/16/2003. Surgical clips in the gallbladder fossa. Remainder visualized upper abdomen unremarkable.  Review of the MIP images confirms the above findings.  IMPRESSION: 1. Negative for aortic dissection, aneurysm, pulmonary embolus, or other acute abnormality.   Electronically Signed   By: Oley Balm M.D.   On: 03/15/2013 11:02    ASSESSMENT AND PLAN  1. Chest pain: Patient presents with recurrent acute on chronic chest pain and persisted despite nitrate therapy at home. Symptoms resolved with morphine here in the ED. Despite prolonged symptoms, her  troponins are normal. The CT  angiogram for chest was performed and was negative for dissection, aneurysm, or pulmonary embolism. [name] had prior workups of chest pain with 3 catheterizations, the last 2005, and also cardiac CT angiography in April 2013, all of which were normal. She had a calcium score of 0 last April.    2. Weak spells: Patient has a long history of sudden weak spells occurring up to several times per day. She does not experience syncope or presyncope. [name] been evaluated with monitors in the past and she says that during the monitoring period, she has not had any spells. She is now interested in implantable loop recorder placement and we should arrange outpatient followup with Dr. Ladona Ridgel following discharge.   3. Anxiety: Patient admits to stress related to caring for her 67 year old mother. She does not feel that her symptoms are in any way related to anxiety however.   4. Mildly prolonged QT: Avoid QT prolonging drugs.  I had extensive discussion with the patient today. Her symptoms are multiple and nonspecific. She had a previous ablation for AV node reentry but otherwise has no identifiable cardiac disease. She fixates on her  prolonged QT although this has not resulted in any known arrythmia. There appears to be a significant anxiety component to her complaints. I recommend DC home today given the absence of acute pathology. Will arrange close follow up with Dr. Shirlee Latch and Dr. Ladona Ridgel.  Davidlee Jeanbaptiste Swaz[place], MD  03/17/2013  11:48 AM

## 2013-03-17 NOTE — Progress Notes (Signed)
Patient c/o itching under electrodes on her chest.  Electrodes removed and red rash noted.  States she has had allergic reactions to electrodes in the past.  Dr. Ihor Gully notified and order received for benadryl.  Renee [name]

## 2013-03-24 ENCOUNTER — Telehealth: Payer: Self-pay | Admitting: Physician Assistant

## 2013-03-24 NOTE — Telephone Encounter (Signed)
Pt called because she is having more palpitations since hospital d/c, when she was changed from Cardizem CD 120 mg to amlodipine 5 mg. She feels she is in a fib more often. Her heart rate is irregular according to her BP monitor.   She refuses to call EMS. She agrees that her event monitor did not show anything because she never has anything while she is wearing it. She repeatedly stated she needs a loop recorder implanted. She agrees that her BP machine will show her HR is irregular, but not why.  Long discussion with patient about what to do.  End result:  She will d/c amlodipine and restart Cardizem CD 120 mg daily.  She will keep the appointments for the cardiac CT and then with Dr. Shirlee Latch.  Advised her I would route the message to Dr. Shirlee Latch so he will know she wishes to see Dr. Ladona Ridgel and get a loop recorder.

## 2013-03-25 ENCOUNTER — Telehealth: Payer: Self-pay | Admitting: Internal Medicine

## 2013-03-25 NOTE — Telephone Encounter (Signed)
Patient aware that she will be getting a call from Dr Lubertha Basque scheduler to arrange an appt for her with Dr Ladona Ridgel to evaluate for ILR.

## 2013-03-25 NOTE — Telephone Encounter (Signed)
New Problem    Pt called and asked to speak to Dr Lubertha Basque RN, adv'd unable put through that call.  Pt asked to speak to [facility].  Said she spoke to Thurston Hole 10/13 am.   Thurston Hole pt is asking about Loop Procdure she should be getting a call on....    Thanks!

## 2013-03-25 NOTE — Telephone Encounter (Signed)
States that all of her questions have been answered this morning by Thurston Hole and she appreciates the follow up regarding her original call.

## 2013-03-25 NOTE — Telephone Encounter (Signed)
Renee [name], would have her see Dr. Ladona Ridgel for consideration for ILR.

## 2013-03-27 ENCOUNTER — Ambulatory Visit (HOSPITAL_COMMUNITY)
Admission: RE | Admit: 2013-03-27 | Discharge: 2013-03-27 | Disposition: A | Discharge status: Home / Self Care | Payer: Medicare Other | Source: Ambulatory Visit | Attending: Cardiology | Admitting: Cardiology

## 2013-03-27 ENCOUNTER — Telehealth: Payer: Self-pay | Admitting: Cardiology

## 2013-03-27 DIAGNOSIS — R079 Chest pain, unspecified: Secondary | ICD-10-CM

## 2013-03-27 DIAGNOSIS — I2510 Atherosclerotic heart disease of native coronary artery without angina pectoris: Secondary | ICD-10-CM | POA: Insufficient documentation

## 2013-03-27 MED ORDER — NITROGLYCERIN 0.4 MG SL SUBL
SUBLINGUAL_TABLET | SUBLINGUAL | Status: AC
  Administered 2013-03-27: 15:00:00 via SUBLINGUAL
  Filled 2013-03-27: qty 25

## 2013-03-27 MED ORDER — NITROGLYCERIN 0.4 MG SL SUBL
SUBLINGUAL_TABLET | SUBLINGUAL | Status: AC
  Filled 2013-03-27: qty 25

## 2013-03-27 MED ORDER — IOHEXOL 350 MG/ML SOLN
100.0000 mL | Freq: Once | INTRAVENOUS | Status: AC | PRN
  Administered 2013-03-27: 80 mL via INTRAVENOUS

## 2013-03-27 MED ORDER — METOPROLOL TARTRATE 1 MG/ML IV SOLN
INTRAVENOUS | Status: AC
  Filled 2013-03-27: qty 5

## 2013-03-27 MED ORDER — METOPROLOL TARTRATE 1 MG/ML IV SOLN
INTRAVENOUS | Status: AC
  Administered 2013-03-27: 5 mg
  Filled 2013-03-27: qty 10

## 2013-03-27 NOTE — Telephone Encounter (Signed)
New message   Having a ct at 2pm today----questions about medications---please call before 10am

## 2013-03-27 NOTE — Telephone Encounter (Signed)
Spoke with patient and answered questions.

## 2013-03-28 ENCOUNTER — Telehealth: Payer: Self-pay | Admitting: Internal Medicine

## 2013-04-01 ENCOUNTER — Encounter: Payer: Self-pay | Admitting: Internal Medicine

## 2013-04-01 ENCOUNTER — Ambulatory Visit (INDEPENDENT_AMBULATORY_CARE_PROVIDER_SITE_OTHER): Payer: Medicare Other | Admitting: Internal Medicine

## 2013-04-01 VITALS — BP 122/72 | HR 65 | Ht 63.0 in | Wt 163.8 lb

## 2013-04-01 DIAGNOSIS — R079 Chest pain, unspecified: Secondary | ICD-10-CM

## 2013-04-01 DIAGNOSIS — I4729 Other ventricular tachycardia: Secondary | ICD-10-CM

## 2013-04-01 DIAGNOSIS — I472 Ventricular tachycardia: Secondary | ICD-10-CM

## 2013-04-01 MED ORDER — FLECAINIDE ACETATE 50 MG PO TABS: ORAL_TABLET | ORAL | Status: DC

## 2013-04-01 NOTE — Assessment & Plan Note (Signed)
We discussed the treatment of small vessel coronary disease and coronary spasm. I've instructed the patient to take an additional dose of Cardizem if she has recurrent chest pain. In addition she is instructed to take nitroglycerin as needed. She is not inclined to take long-acting nitrates at the present time though I discussed their indication.

## 2013-04-01 NOTE — Patient Instructions (Signed)
Your physician recommends that you schedule a follow-up appointment in: 4 months with Dr Ladona Ridgel   Your physician has recommended you make the following change in your medication:  1) Start Flecainide 50mg  1/2 tablet twice daily

## 2013-04-01 NOTE — Assessment & Plan Note (Signed)
The patient has symptomatic nonsustained atrial and ventricular arrhythmias. As her symptoms are fairly severe, I recommended initiation of very low-dose flecainide. She will take 25 mg twice daily, with plans to up titrate this medication as needed.
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
## 2013-04-04 ENCOUNTER — Encounter: Payer: Self-pay | Admitting: Cardiology

## 2013-04-04 ENCOUNTER — Ambulatory Visit (INDEPENDENT_AMBULATORY_CARE_PROVIDER_SITE_OTHER): Payer: Medicare Other | Admitting: Cardiology

## 2013-04-04 VITALS — BP 122/70 | HR 64 | Ht 63.0 in | Wt 166.0 lb

## 2013-04-04 DIAGNOSIS — I10 Essential (primary) hypertension: Secondary | ICD-10-CM

## 2013-04-04 DIAGNOSIS — I498 Other specified cardiac arrhythmias: Secondary | ICD-10-CM

## 2013-04-04 DIAGNOSIS — R079 Chest pain, unspecified: Secondary | ICD-10-CM

## 2013-04-04 NOTE — Patient Instructions (Signed)
Your physician recommends that you schedule a follow-up appointment in: 1 month with Dr McLean  
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
## 2013-04-25 ENCOUNTER — Ambulatory Visit: Payer: Medicare Other | Admitting: Neurology

## 2013-04-30 ENCOUNTER — Ambulatory Visit (INDEPENDENT_AMBULATORY_CARE_PROVIDER_SITE_OTHER): Payer: Medicare Other | Admitting: Cardiology

## 2013-04-30 ENCOUNTER — Encounter: Payer: Self-pay | Admitting: Cardiology

## 2013-04-30 VITALS — BP 130/82 | HR 67 | Ht 63.0 in | Wt 167.0 lb

## 2013-04-30 DIAGNOSIS — I498 Other specified cardiac arrhythmias: Secondary | ICD-10-CM

## 2013-04-30 DIAGNOSIS — R1319 Other dysphagia: Secondary | ICD-10-CM

## 2013-04-30 DIAGNOSIS — R55 Syncope and collapse: Secondary | ICD-10-CM

## 2013-04-30 DIAGNOSIS — G894 Chronic pain syndrome: Secondary | ICD-10-CM

## 2013-04-30 DIAGNOSIS — R079 Chest pain, unspecified: Secondary | ICD-10-CM

## 2013-04-30 NOTE — Patient Instructions (Signed)
You have been referred to Dr Berton Mount to evaluation for Tilt Table testing.  Your physician recommends that you schedule a follow-up appointment in: 3 months with Dr Shirlee Latch.
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
## 2013-05-01 ENCOUNTER — Other Ambulatory Visit: Payer: Self-pay | Admitting: Pulmonary Disease

## 2013-05-06 ENCOUNTER — Encounter: Payer: Self-pay | Admitting: *Deleted

## 2013-05-06 ENCOUNTER — Encounter: Payer: Self-pay | Admitting: Internal Medicine

## 2013-05-06 ENCOUNTER — Ambulatory Visit (INDEPENDENT_AMBULATORY_CARE_PROVIDER_SITE_OTHER): Payer: Medicare Other | Admitting: Internal Medicine

## 2013-05-06 VITALS — BP 145/71 | HR 85 | Ht 63.0 in | Wt 165.1 lb

## 2013-05-06 DIAGNOSIS — R55 Syncope and collapse: Secondary | ICD-10-CM

## 2013-05-06 DIAGNOSIS — R569 Unspecified convulsions: Secondary | ICD-10-CM

## 2013-05-06 DIAGNOSIS — I498 Other specified cardiac arrhythmias: Secondary | ICD-10-CM
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
## 2013-05-06 NOTE — Assessment & Plan Note (Signed)
The patient has a history of spells of weakness and pallor. Area of unusually long duration. Vital signs obtained during the residual of when the spells have been normal. The initiation of the spells are characterized by lightheadedness in addition. He requested that we undertake to table testing looking for a neurally mediated vasomotor component. I think that that is reasonable. I think the reproducing of her symptoms would be useful; a negative test for forming.  I raised with the patient a possibility that stress is part of this. I also wonder whether there could be a neurologic cause for these very atypical spells

## 2013-05-06 NOTE — Patient Instructions (Signed)
Your physician recommends that you continue on your current medications as directed. Please refer to the Current Medication list given to you today.  Your physician has recommended that you have a tilt table test. This test is sometimes used to help determine the cause of fainting spells. You lie on a table that moves from a lying down to an upright position. The change in position can bring on loss of consciousness. The doctor monitors your symptoms, heart rate, EKG, and blood pressure throughout the test. The doctor also may give you a medicine and then monitor your response to the medicine. This is done in the hospital and usually takes half of a day to complete the procedure. Please see the instruction sheet given to you today for more information.

## 2013-05-08 ENCOUNTER — Encounter (HOSPITAL_COMMUNITY): Payer: Self-pay | Admitting: Pharmacy Technician

## 2013-05-15 ENCOUNTER — Encounter (HOSPITAL_COMMUNITY)
Admission: RE | Disposition: A | Discharge status: Home / Self Care | Payer: Self-pay | Source: Ambulatory Visit | Attending: Internal Medicine

## 2013-05-15 ENCOUNTER — Ambulatory Visit (HOSPITAL_COMMUNITY)
Admission: RE | Admit: 2013-05-15 | Discharge: 2013-05-15 | Disposition: A | Discharge status: Home / Self Care | Payer: Medicare Other | Source: Ambulatory Visit | Attending: Internal Medicine | Admitting: Internal Medicine

## 2013-05-15 DIAGNOSIS — Z7982 Long term (current) use of aspirin: Secondary | ICD-10-CM | POA: Insufficient documentation

## 2013-05-15 DIAGNOSIS — I472 Ventricular tachycardia, unspecified: Secondary | ICD-10-CM | POA: Insufficient documentation

## 2013-05-15 DIAGNOSIS — R55 Syncope and collapse: Secondary | ICD-10-CM

## 2013-05-15 DIAGNOSIS — I10 Essential (primary) hypertension: Secondary | ICD-10-CM | POA: Insufficient documentation

## 2013-05-15 DIAGNOSIS — R5381 Other malaise: Secondary | ICD-10-CM | POA: Insufficient documentation

## 2013-05-15 DIAGNOSIS — IMO0001 Reserved for inherently not codable concepts without codable children: Secondary | ICD-10-CM

## 2013-05-15 DIAGNOSIS — I4729 Other ventricular tachycardia: Secondary | ICD-10-CM | POA: Insufficient documentation

## 2013-05-15 DIAGNOSIS — I498 Other specified cardiac arrhythmias: Secondary | ICD-10-CM

## 2013-05-15 DIAGNOSIS — E119 Type 2 diabetes mellitus without complications: Secondary | ICD-10-CM | POA: Insufficient documentation

## 2013-05-15 DIAGNOSIS — I4949 Other premature depolarization: Secondary | ICD-10-CM | POA: Insufficient documentation

## 2013-05-15 DIAGNOSIS — I491 Atrial premature depolarization: Secondary | ICD-10-CM | POA: Insufficient documentation

## 2013-05-15 HISTORY — PX: TILT TABLE STUDY: SHX5493

## 2013-05-15 SURGERY — TILT TABLE STUDY

## 2013-05-15 MED ORDER — NITROGLYCERIN 0.4 MG/SPRAY TL SOLN
Status: AC
  Filled 2013-05-15: qty 4.9

## 2013-05-15 MED ORDER — SODIUM CHLORIDE 0.9 % IV SOLN
INTRAVENOUS | Status: DC
  Administered 2013-05-15: 11:00:00 via INTRAVENOUS

## 2013-05-15 NOTE — CV Procedure (Signed)
Renee [name] [id number]  [id number]  Preop [id number] Postop Dx same/   Procedure: TIlt testing with NTG  Cx: None   Pt was equilibrated in the supine position with BP 160s and HR 70s   She was tilted up for 15 min followup by NTG 400 mcg and tilted for 10 more minutes without symptoms or change in HR or BP  Negative tilt test   Sherryl Manges, MD 05/15/2013 1:21 PM
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
## 2013-05-15 NOTE — Interval H&P Note (Signed)
History and Physical Interval Note:  05/15/2013 10:27 AM  Renee [name]  has presented today for surgery, with the diagnosis of syncope  The various methods of treatment have been discussed with the patient and family. After consideration of risks, benefits and other options for treatment, the patient has consented to  Procedure(s): TILT TABLE STUDY (N/A) as a surgical intervention .  The patient's history has been reviewed, patient examined, no change in status, stable for surgery.  I have reviewed the patient's chart and labs.  Questions were answered to the patient's satisfaction.        Sherryl Manges

## 2013-05-19 ENCOUNTER — Other Ambulatory Visit: Payer: Self-pay | Admitting: Pulmonary Disease

## 2013-05-19 ENCOUNTER — Other Ambulatory Visit: Payer: Self-pay | Admitting: Cardiology

## 2013-05-23 ENCOUNTER — Telehealth: Payer: Self-pay | Admitting: Cardiology

## 2013-05-23 NOTE — Telephone Encounter (Signed)
Pt states since tilt table she has almost constant pain in her right calf, nothing seems to make it better or worse. It has started to move behind her knee and to her thigh. Her right leg is not more swollen than left leg, she states she always has  some numbness in her right foot and toes but this is unchanged, she is not sure if there is any discoloration in her right calf. She is concerned about a blood clot since she was told the IV in her right arm had clotted. I will forward to Dr Shirlee Latch for review.

## 2013-05-23 NOTE — Telephone Encounter (Signed)
LMTCB

## 2013-05-23 NOTE — Telephone Encounter (Signed)
New Problem:  Pt states in September she was put on new heart meds.. Pt states she has pain in her right lower leg/caff all the way up to her knee and thigh. Pt is wanting to know if it is a side effect to one of her meds. Pt is requesting a call back from the nurse.

## 2013-05-23 NOTE — Telephone Encounter (Signed)
Pt had tilt table test last week, she had IV in her right arm that she was told had clotted. Since tilt table she has almost constant pain  right leg is not swollen more than left leg. She states that nothing seems to make it worse but it seems to go to her knee and thigh now.

## 2013-05-23 NOTE — Telephone Encounter (Signed)
Pt advised. She will call back if any changes and she decides to have ultrasound.   Correction to earlier note--pt states IV was in her left arm and not her right arm.

## 2013-05-23 NOTE — Telephone Encounter (Signed)
There would be no reason for her to have a DVT, it is usually triggered by something. If she notices swelling in her right calf compared to the left, we could do an ultrasound.

## 2013-06-28 ENCOUNTER — Telehealth: Payer: Self-pay | Admitting: Pulmonary Disease

## 2013-06-28 DIAGNOSIS — K5909 Other constipation: Secondary | ICD-10-CM

## 2013-06-28 MED ORDER — METHYLPREDNISOLONE (PAK) 4 MG PO TABS: ORAL_TABLET | ORAL | Status: DC

## 2013-06-28 MED ORDER — AZITHROMYCIN 250 MG PO TABS: ORAL_TABLET | ORAL | Status: DC

## 2013-06-28 NOTE — Telephone Encounter (Signed)
Called and spoke with pt and she stated that she has been sick for almost 4 wks.  She stated that this started out as head cold and now is in her chest---non productive cough, fever, sneezing, no energy.  She has been using mucinex and lemon/vinegar in water with no help.  Pt is requesting something to be called in for her.  SN please advise. Thanks  Last ov--02/15/2013 Next ov--02/18/2014  Allergies  Allergen Reactions  . Moxifloxacin Other (See Comments)    REACTION: pt states she can't take Avelox... Unsure of reaction  . Other     Pt. States her skin blisters with use of "EKG stickies"     Current Outpatient Prescriptions on File Prior to Visit  Medication Sig Dispense Refill  . ALPRAZolam (XANAX) 0.5 MG tablet Take 0.5 mg by mouth daily as needed for anxiety.      Marland Kitchen aspirin EC 81 MG tablet Take 81 mg by mouth every morning.       . dicyclomine (BENTYL) 20 MG tablet Take 20 mg by mouth every morning.       . diltiazem (CARDIZEM CD) 120 MG 24 hr capsule Take 120 mg by mouth daily.      Marland Kitchen diltiazem (CARDIZEM CD) 120 MG 24 hr capsule TAKE ONE CAPSULE BY MOUTH ONCE DAILY  30 capsule  0  . estradiol (ESTRACE) 2 MG tablet Take 1 tablet by mouth daily.      . flecainide (TAMBOCOR) 50 MG tablet Take 25 mg by mouth 2 (two) times daily. Take 1/2 tablet twice daily      . KLOR-CON M10 10 MEQ tablet TAKE ONE TABLET BY MOUTH ONCE DAILY  30 tablet  0  . medroxyPROGESTERone (PROVERA) 5 MG tablet Take 5 mg by mouth daily.      . naproxen sodium (ALEVE) 220 MG tablet Take 220 mg by mouth daily as needed (for arthritic pain.).      Marland Kitchen naproxen sodium (ANAPROX) 220 MG tablet Take 440 mg by mouth daily as needed.      . nitroGLYCERIN (NITROSTAT) 0.4 MG SL tablet Place 0.4 mg under the tongue every 5 (five) minutes as needed for chest pain. For chest pain      . pantoprazole (PROTONIX) 40 MG tablet Take 80 mg by mouth daily.      . potassium chloride (K-DUR) 10 MEQ tablet Take 10 mEq by mouth daily.       Marland Kitchen triamterene-hydrochlorothiazide (DYAZIDE) 37.5-25 MG per capsule Take 1 capsule by mouth daily.      Marland Kitchen zolpidem (AMBIEN) 10 MG tablet Take 5 mg by mouth daily as needed for sleep.       No current facility-administered medications on file prior to visit.

## 2013-06-28 NOTE — Telephone Encounter (Signed)
Called and spoke with pt and she is aware of SN recs.  meds have been sent to the pharmacy and pt is aware.  She will think about who she wants to see for her new primary care doctor.

## 2013-06-28 NOTE — Telephone Encounter (Signed)
Per SN----  zpak # 1  Take as directed Medrol dosepak  Take as directed  Let pt know that SN will be retiring from primary care and we will need to set her up with a new primary care doctor.

## 2013-07-01 ENCOUNTER — Telehealth: Payer: Self-pay | Admitting: Gastroenterology

## 2013-07-01 NOTE — Telephone Encounter (Signed)
Pt's last COLON 06/25/08 revealing mild diverticulosis and hemorrhoids. EGD 08/09/06 with [facility]. Pt reports she has seen Dr Sharlett Iles for years and has IBS. She reports she is on now new meds except she started a Z Pack Saturday for a sinus infection. She reports dark olive green stools x 3 weeks. She alternates from constipation to diarrhea with her IBS, but that's not new. Some is having some mild pain in the front of the stomach area like when you have a GI bug. Pt given an appt with DR Sharlett Iles on 07/04/13.

## 2013-07-03 ENCOUNTER — Other Ambulatory Visit: Payer: Self-pay | Admitting: Cardiology

## 2013-07-03 ENCOUNTER — Other Ambulatory Visit: Payer: Self-pay | Admitting: Pulmonary Disease

## 2013-07-04 ENCOUNTER — Ambulatory Visit (INDEPENDENT_AMBULATORY_CARE_PROVIDER_SITE_OTHER): Payer: Medicare HMO | Admitting: Gastroenterology

## 2013-07-04 ENCOUNTER — Encounter: Payer: Self-pay | Admitting: Gastroenterology

## 2013-07-04 ENCOUNTER — Other Ambulatory Visit: Payer: Medicare HMO

## 2013-07-04 VITALS — BP 132/80 | HR 63 | Ht 63.0 in | Wt 168.6 lb

## 2013-07-04 DIAGNOSIS — R194 Change in bowel habit: Secondary | ICD-10-CM

## 2013-07-04 DIAGNOSIS — R198 Other specified symptoms and signs involving the digestive system and abdomen: Secondary | ICD-10-CM

## 2013-07-04 DIAGNOSIS — K589 Irritable bowel syndrome without diarrhea: Secondary | ICD-10-CM

## 2013-07-04 NOTE — Progress Notes (Signed)
History of Present Illness:  This is a 68 year old Caucasian female who says she has been a patient of ours in the past although we have no records of any clinic visits.  She did have a negative colonoscopy except for diverticulosis 5 years ago.  She has symptoms consistent with chronic IBS, constipation predominant.  She is most concerned currently about several weeks of a greenish colored stool with mild, cramping but no true diarrhea, melena or hematochezia.  She does take dicyclomine 20 mg once a day for abdominal spasms.  She'll or variety of other medications listed and reviewed which include Aleve when necessary for arthritic pain, and blood pressure medication.  She denies a current cardiovascular pulmonary complaints.  She also denies a specific food intolerances, denies abuse of sorbitol and fructose and lactose.  [name] had no anorexia or weight loss.  Family history is noncontributory.  There is no history of upper GI or hepatobiliary symptoms.  She is status post cholecystectomy.  I have reviewed this patient's present history, medical and surgical past history, allergies and medications.     ROS:   All systems were reviewed and are negative unless otherwise stated in the HPI.    Physical Exam: Blood pressure 132/80, pulse 63 and regular and weight 168 with a BMI of 29.87. General well developed well nourished patient in no acute distress, appearing their stated age Eyes PERRLA, no icterus, fundoscopic exam per opthamologist Skin no lesions noted Neck supple, no adenopathy, no thyroid enlargement, no tenderness Chest clear to percussion and auscultation Heart no significant murmurs, gallops or rubs noted Abdomen no hepatosplenomegaly masses or tenderness, BS normal.  Rectal inspection normal no fissures, or fistulae noted.  No masses or tenderness on digital exam. Stool guaiac negative. Extremities no acute joint lesions, edema, phlebitis or evidence of cellulitis. Neurologic patient  oriented x 3, cranial nerves intact, no focal neurologic deficits noted. Psychological mental status normal and normal affect.  Assessment and plan: Irritable bowel syndrome and a patient.  Also probably has malabsorption of nonabsorbable carbohydrates.  I placed her on a FOD-MAP diet for IBS with avoidance of nonabsorbable carbohydrates, also placed her on probiotics with VSL#3 twice a day for 2 weeks, continue dicyclomine, and have ordered a celiac panel for review.  I do not think we need to do repeat endoscopy or colonoscopy in this patient.  She is status post cholecystectomy.  Also over the last several years she's had multiple orthopedic procedures.

## 2013-07-04 NOTE — Patient Instructions (Addendum)
FOD MAP Diet given today for your review  Artifical Sweeteners information given today for your review   We have given you samples of the following medication to take: VSL # 3, please take one capsule by mouth twice daily for two weeks  Your physician has requested that you go to the basement for the following lab work before leaving today: Celiac Panel  Please call Caren Griffins, RN, Dr. Buel Ream nurse in two weeks and give an update on how you are feeling

## 2013-07-05 LAB — CELIAC PANEL 10
Endomysial Screen: NEGATIVE
GLIADIN IGG: 5.5 U/mL (ref <20)
Gliadin IgA: 3.3 U/mL (ref <20)
IgA: 179 mg/dL (ref 69–380)
TISSUE TRANSGLUTAMINASE AB, IGA: 3.9 U/mL (ref <20)
Tissue Transglut Ab: 5 U/mL (ref <20)

## 2013-07-17 ENCOUNTER — Telehealth: Payer: Self-pay | Admitting: Gastroenterology

## 2013-07-18 MED ORDER — VSL#3 PO PACK: PACK | ORAL | Status: DC

## 2013-07-18 NOTE — Telephone Encounter (Signed)
Pt reports she was only given enough VSL for 8 days and since she stopped she is having problems with terrible incontinent gas; it just comes out. Her bowels are better as well. Looked and we have no samples so I called pt and she is willing to have me order the med. Done.

## 2013-07-19 ENCOUNTER — Telehealth: Payer: Self-pay | Admitting: *Deleted

## 2013-07-19 NOTE — Telephone Encounter (Signed)
[facility] sent over fax stating that they do not know what VSL # 3 is. I called the pharmacy and spoke with the pharmacist and the pharmacist said they do not have that medication. I called [facility] in [place] to see if they have VSL # 3. I spoke with pharmacist and they never heard of it nor do they stock it.  [facility] VSL # 3  I called patient and made sure it was ok to send VSL # 3 to [facility] for her to pick up. Patient said that she will go to [facility]  I spoke with Caryl Pina at [facility] and she will order VSL # 3, patient can pick up Monday Caryl Pina took patient info and said she will contact patient

## 2013-07-29 ENCOUNTER — Telehealth: Payer: Self-pay | Admitting: Pulmonary Disease

## 2013-07-29 MED ORDER — FAMCICLOVIR 500 MG PO TABS: 500.0000 mg | ORAL_TABLET | Freq: Three times a day (TID) | ORAL | Status: DC

## 2013-07-29 MED ORDER — METHYLPREDNISOLONE 4 MG PO TABS: ORAL_TABLET | ORAL | Status: DC

## 2013-07-29 NOTE — Telephone Encounter (Signed)
Per SN --  Famvir 500mg  1 po TID #21 Medrol Dose pack #1  lmtcb x1

## 2013-07-29 NOTE — Telephone Encounter (Signed)
Pt returned call.  Holly D Pryor ° °

## 2013-07-29 NOTE — Telephone Encounter (Signed)
Spoke with pt adn is aware of recs. rx's sent. Nothing further

## 2013-07-29 NOTE — Telephone Encounter (Signed)
Spoke w/ pt. She reprots she thinks she has ? Shingles x Monday night. She had a blister on her neck and rubbed it. The Tuesday morning she had them all in her head. All on right side of head also this has spread to ear. Head is burning, stinging and is painful where these bumps are. Please advise SN thanks  Allergies  Allergen Reactions  . Moxifloxacin Other (See Comments)    REACTION: pt states she can't take Avelox... Unsure of reaction  . Other     Pt. States her skin blisters with use of "EKG stickies"

## 2013-07-31 ENCOUNTER — Ambulatory Visit (INDEPENDENT_AMBULATORY_CARE_PROVIDER_SITE_OTHER): Payer: Medicare HMO | Admitting: Cardiology

## 2013-07-31 ENCOUNTER — Encounter: Payer: Self-pay | Admitting: Cardiology

## 2013-07-31 VITALS — BP 130/78 | HR 88 | Ht 63.0 in | Wt 171.0 lb

## 2013-07-31 DIAGNOSIS — R079 Chest pain, unspecified: Secondary | ICD-10-CM

## 2013-07-31 DIAGNOSIS — R55 Syncope and collapse: Secondary | ICD-10-CM

## 2013-07-31 DIAGNOSIS — IMO0001 Reserved for inherently not codable concepts without codable children: Secondary | ICD-10-CM

## 2013-07-31 DIAGNOSIS — I498 Other specified cardiac arrhythmias: Secondary | ICD-10-CM

## 2013-07-31 DIAGNOSIS — R6889 Other general symptoms and signs: Secondary | ICD-10-CM

## 2013-07-31 DIAGNOSIS — R569 Unspecified convulsions: Secondary | ICD-10-CM

## 2013-07-31 NOTE — Patient Instructions (Signed)
Your physician recommends that you return for a FASTING lipid profile /BMET/Magnesium.  Your physician recommends that you schedule a follow-up appointment in: 4 months with Dr Aundra Dubin.
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
## 2013-08-03 ENCOUNTER — Other Ambulatory Visit: Payer: Self-pay | Admitting: Cardiology

## 2013-08-03 ENCOUNTER — Emergency Department (HOSPITAL_COMMUNITY)
Admission: EM | Admit: 2013-08-03 | Discharge: 2013-08-03 | Disposition: A | Discharge status: Home / Self Care | Payer: Medicare HMO | Attending: Emergency Medicine | Admitting: Emergency Medicine

## 2013-08-03 ENCOUNTER — Emergency Department (HOSPITAL_COMMUNITY): Payer: Medicare HMO

## 2013-08-03 ENCOUNTER — Encounter (HOSPITAL_COMMUNITY): Payer: Self-pay | Admitting: Emergency Medicine

## 2013-08-03 ENCOUNTER — Other Ambulatory Visit: Payer: Self-pay

## 2013-08-03 DIAGNOSIS — K219 Gastro-esophageal reflux disease without esophagitis: Secondary | ICD-10-CM | POA: Insufficient documentation

## 2013-08-03 DIAGNOSIS — Z7982 Long term (current) use of aspirin: Secondary | ICD-10-CM | POA: Insufficient documentation

## 2013-08-03 DIAGNOSIS — R110 Nausea: Secondary | ICD-10-CM | POA: Insufficient documentation

## 2013-08-03 DIAGNOSIS — Z79899 Other long term (current) drug therapy: Secondary | ICD-10-CM | POA: Insufficient documentation

## 2013-08-03 DIAGNOSIS — R072 Precordial pain: Secondary | ICD-10-CM | POA: Insufficient documentation

## 2013-08-03 DIAGNOSIS — Z8541 Personal history of malignant neoplasm of cervix uteri: Secondary | ICD-10-CM | POA: Insufficient documentation

## 2013-08-03 DIAGNOSIS — K589 Irritable bowel syndrome without diarrhea: Secondary | ICD-10-CM | POA: Insufficient documentation

## 2013-08-03 DIAGNOSIS — M1990 Unspecified osteoarthritis, unspecified site: Secondary | ICD-10-CM | POA: Insufficient documentation

## 2013-08-03 DIAGNOSIS — I498 Other specified cardiac arrhythmias: Secondary | ICD-10-CM | POA: Insufficient documentation

## 2013-08-03 DIAGNOSIS — I10 Essential (primary) hypertension: Secondary | ICD-10-CM | POA: Insufficient documentation

## 2013-08-03 DIAGNOSIS — Z9861 Coronary angioplasty status: Secondary | ICD-10-CM | POA: Insufficient documentation

## 2013-08-03 DIAGNOSIS — G894 Chronic pain syndrome: Secondary | ICD-10-CM | POA: Insufficient documentation

## 2013-08-03 DIAGNOSIS — R079 Chest pain, unspecified: Secondary | ICD-10-CM

## 2013-08-03 DIAGNOSIS — E119 Type 2 diabetes mellitus without complications: Secondary | ICD-10-CM | POA: Insufficient documentation

## 2013-08-03 DIAGNOSIS — F411 Generalized anxiety disorder: Secondary | ICD-10-CM | POA: Insufficient documentation

## 2013-08-03 LAB — CBC WITH DIFFERENTIAL/PLATELET
Basophils Absolute: 0 10*3/uL (ref 0.0–0.1)
Basophils Relative: 0 % (ref 0–1)
EOS ABS: 0.2 10*3/uL (ref 0.0–0.7)
Eosinophils Relative: 2 % (ref 0–5)
HCT: 43.9 % (ref 36.0–46.0)
Hemoglobin: 15 g/dL (ref 12.0–15.0)
LYMPHS ABS: 3.3 10*3/uL (ref 0.7–4.0)
Lymphocytes Relative: 37 % (ref 12–46)
MCH: 28.4 pg (ref 26.0–34.0)
MCHC: 34.2 g/dL (ref 30.0–36.0)
MCV: 83.1 fL (ref 78.0–100.0)
MONO ABS: 0.6 10*3/uL (ref 0.1–1.0)
Monocytes Relative: 6 % (ref 3–12)
Neutro Abs: 4.9 10*3/uL (ref 1.7–7.7)
Neutrophils Relative %: 55 % (ref 43–77)
PLATELETS: 252 10*3/uL (ref 150–400)
RBC: 5.28 MIL/uL — AB (ref 3.87–5.11)
RDW: 13.2 % (ref 11.5–15.5)
WBC: 8.9 10*3/uL (ref 4.0–10.5)

## 2013-08-03 LAB — BASIC METABOLIC PANEL
BUN: 13 mg/dL (ref 6–23)
CALCIUM: 9.8 mg/dL (ref 8.4–10.5)
CO2: 28 mEq/L (ref 19–32)
Chloride: 95 mEq/L — ABNORMAL LOW (ref 96–112)
Creatinine, Ser: 0.77 mg/dL (ref 0.50–1.10)
GFR calc Af Amer: >90 mL/min (ref >90)
GFR calc non Af Amer: 85 mL/min — ABNORMAL LOW (ref >90)
GLUCOSE: 88 mg/dL (ref 70–99)
Potassium: 3.5 mEq/L — ABNORMAL LOW (ref 3.7–5.3)
SODIUM: 137 meq/L (ref 137–147)

## 2013-08-03 LAB — I-STAT TROPONIN, ED
Troponin i, poc: 0 ng/mL (ref 0.00–0.08)
Troponin i, poc: 0 ng/mL (ref 0.00–0.08)

## 2013-08-03 MED ORDER — ONDANSETRON HCL 4 MG/2ML IJ SOLN
4.0000 mg | Freq: Once | INTRAMUSCULAR | Status: AC
  Administered 2013-08-03: 4 mg via INTRAVENOUS
  Filled 2013-08-03: qty 2

## 2013-08-03 NOTE — ED Notes (Signed)
Patient transported to X-ray 

## 2013-08-03 NOTE — ED Notes (Signed)
Per EMS, pt coming from home with CP. Pt states she woke up this morning and "just didn't feel right." Pt states she got up and started having sharp CP in center of chest that radiated to both shoulders. Pt has h/o angina and took 1 Nitro with relief of CP. Pt states she still doesn't feel good and has nausea but has not vomited. NAD, A&O.

## 2013-08-03 NOTE — ED Notes (Signed)
PA at bedside.

## 2013-08-03 NOTE — ED Provider Notes (Signed)
5:12 PM Patient remains pain free at this time, second troponin is negative.  Patient will be discharged home and will follow up as scheduled with Dr. Aundra Dubin.  Results for orders placed during the hospital encounter of 08/03/13  CBC WITH DIFFERENTIAL      Result Value Ref Range   WBC 8.9  4.0 - 10.5 K/uL   RBC 5.28 (*) 3.87 - 5.11 MIL/uL   Hemoglobin 15.0  12.0 - 15.0 g/dL   HCT 43.9  36.0 - 46.0 %   MCV 83.1  78.0 - 100.0 fL   MCH 28.4  26.0 - 34.0 pg   MCHC 34.2  30.0 - 36.0 g/dL   RDW 13.2  11.5 - 15.5 %   Platelets 252  150 - 400 K/uL   Neutrophils Relative % 55  43 - 77 %   Neutro Abs 4.9  1.7 - 7.7 K/uL   Lymphocytes Relative 37  12 - 46 %   Lymphs Abs 3.3  0.7 - 4.0 K/uL   Monocytes Relative 6  3 - 12 %   Monocytes Absolute 0.6  0.1 - 1.0 K/uL   Eosinophils Relative 2  0 - 5 %   Eosinophils Absolute 0.2  0.0 - 0.7 K/uL   Basophils Relative 0  0 - 1 %   Basophils Absolute 0.0  0.0 - 0.1 K/uL  BASIC METABOLIC PANEL      Result Value Ref Range   Sodium 137  137 - 147 mEq/L   Potassium 3.5 (*) 3.7 - 5.3 mEq/L   Chloride 95 (*) 96 - 112 mEq/L   CO2 28  19 - 32 mEq/L   Glucose, Bld 88  70 - 99 mg/dL   BUN 13  6 - 23 mg/dL   Creatinine, Ser 0.77  0.50 - 1.10 mg/dL   Calcium 9.8  8.4 - 10.5 mg/dL   GFR calc non Af Amer 85 (*) >90 mL/min   GFR calc Af Amer >90  >90 mL/min  I-STAT TROPOININ, ED      Result Value Ref Range   Troponin i, poc 0.00  0.00 - 0.08 ng/mL   Comment 3           I-STAT TROPOININ, ED      Result Value Ref Range   Troponin i, poc 0.00  0.00 - 0.08 ng/mL   Comment 3            Dg Chest 2 View  08/03/2013   CLINICAL DATA:  Chest pain radiating to shoulder blades, nausea, dizziness, history hypertension, diabetes, coronary artery disease post PTCA  EXAM: CHEST  2 VIEW  COMPARISON:  03/15/2013  FINDINGS: Normal heart size, mediastinal contours, and pulmonary vascularity.  Mild chronic peribronchial thickening and question mild hyperinflation.  No acute  infiltrate, pleural effusion or pneumothorax.  Bones unremarkable.  IMPRESSION: Mild bronchitic changes.   Electronically Signed   By: Lavonia Dana M.D.   On: 08/03/2013 14:33      Idalia Needle. Joelyn Oms, [place] 08/03/13 1713

## 2013-08-03 NOTE — ED Provider Notes (Signed)
CSN: [id number]     Arrival date & time 08/03/13  1247 History   First MD Initiated Contact with Patient 08/03/13 1248     Chief Complaint  Patient presents with  . Chest Pain  . Nausea   (Consider location/radiation/quality/duration/timing/severity/associated sxs/prior Treatment) The history is provided by the patient and medical records.   This is a 68 year old female with past history significant for hypertension, chronic chest pain, recurrent syncope, diabetes, presenting to the ED for chest pain.  Patient states she woke up this morning around 0600 " just did not feel right".  States she got a, and drank a pot of coffee, make breakfast, and began feeling very lightheaded. She states she was walking to go sit down patient developed chest pain which was localized to her mid sternal region with radiation to bilateral shoulders.  Patient endorses associated palpitations, diaphoresis, and shortness of breath. She states this pain was consistent with her anginal equivalent, which she gets almost on a monthly basis. Patient took one sublingual nitroglycerin with complete resolution of her chest pain. On arrival to the ED, patient still complains of some nausea but denies vomiting. She did have one episode of nonbloody, watery diarrhea last night. She denies any recent sick contacts. No recent antibiotics use. No fevers or chills. No recent URI sx. Denies any abdominal pain.  Pt on daily ASA which she has already taken today.  Vital signs stable on arrival. PCP-- Lenna Gilford Cardiologist-- Aundra Dubin  Past Medical History  Diagnosis Date  . Bronchitis     Recurrent  . Hypertension   . Chronic chest pain     a. 2005 Cath: Mild nonobstructive plaque (30-40% LCX);  b. 08/2011 low risk myoview;  c. 09/2011 Coronary CT: NL cors w ca score of 0.  . AV nodal re-entry tachycardia     a. s/p RFA 1993- Dr. Rolland Porter, [place]  . Long QT interval     a. mild - advised to avoid meds that may prolong QT.  Marland Kitchen GERD  (gastroesophageal reflux disease)   . Dysphagia   . Duodenitis without hemorrhage   . Diverticulosis of colon   . Irritable bowel syndrome   . Constipation, chronic   . Hemorrhoids   . Fecal incontinence   . Thyroid nodule   . Degenerative joint disease     a. s/p L Total Knee Arthroplasty.  . Lumbar back pain   . Fibromyalgia   . Chronic pain syndrome   . Neuropathy   . Anxiety   . Pre-syncope   . Complication of anesthesia     " slow to awake prolong QT "  . Recurrent upper respiratory infection (URI)   . Cancer     hx of cervical   ca  . SVT (supraventricular tachycardia)     a. nonsustained SVT - previously offered flecainide but refused;  b. 12/2012 30 day event monitor w/o significant arrhythmias.  . Osteoporosis   . Palpitations   . Diet-controlled type 2 diabetes mellitus    Past Surgical History  Procedure Laterality Date  . Biopsy thyroid    . Total hip arthroplasty  10/10    Left, at [facility]  . Total hip arthroplasty  10/10    Left, at [facility]  . Hysteroscopy  2002    with resection of endometrial polyps. by Dr.MCPhail  . Coronary angioplasty  2007  . Cardiac electrophysiology study and ablation      atrioventricular nodal reentant tachycardia  . Ablation of dysrhythmic  focus  1993  . Cholecystectomy  1980  . Orif radial head / neck fracture  09/05/88  . Colonoscopy  06/25/2008    Diverticulosis and Hemorrhoids  . Esophagogastroduodenoscopy     Family History  Problem Relation Age of Onset  . Ovarian cancer Mother   . Stroke Mother   . Diabetes Mother   . Asthma Mother   . Diabetes Sister     Pacemaker, CHF  . Heart disease Sister   . Asthma Sister   . Lung cancer Father     Heart problems  . Heart disease Sister   . Diabetes Brother    History  Substance Use Topics  . Smoking status: Never Smoker   . Smokeless tobacco: Never Used  . Alcohol Use: Yes     Comment: Occasional    OB History   Grav Para Term Preterm Abortions  TAB SAB Ect Mult Living                 Review of Systems  Cardiovascular: Positive for chest pain.  Gastrointestinal: Positive for nausea.  All other systems reviewed and are negative.   Allergies  Moxifloxacin and Other  Home Medications   Current Outpatient Rx  Name  Route  Sig  Dispense  Refill  . ALPRAZolam (XANAX) 0.5 MG tablet   Oral   Take 0.5 mg by mouth daily as needed for anxiety.         Marland Kitchen aspirin EC 81 MG tablet   Oral   Take 81 mg by mouth every morning.          . dicyclomine (BENTYL) 20 MG tablet   Oral   Take 20 mg by mouth every morning.          . Dietary Management Product (VSL#3) PACK      Take one pack by mouth twice daily.   30 each   3   . diltiazem (CARDIZEM CD) 120 MG 24 hr capsule      TAKE ONE CAPSULE BY MOUTH ONCE DAILY   30 capsule   0   . estradiol (ESTRACE) 2 MG tablet   Oral   Take 1 tablet by mouth daily.         . famciclovir (FAMVIR) 500 MG tablet   Oral   Take 1 tablet (500 mg total) by mouth 3 (three) times daily.   21 tablet   0   . flecainide (TAMBOCOR) 50 MG tablet   Oral   Take 25 mg by mouth 2 (two) times daily. Take 1/2 tablet twice daily         . KLOR-CON M10 10 MEQ tablet      TAKE ONE TABLET BY MOUTH ONCE DAILY   30 tablet   6   . medroxyPROGESTERone (PROVERA) 5 MG tablet   Oral   Take 5 mg by mouth daily.         . methylPREDNISolone (MEDROL) 4 MG tablet      Take as directed   21 tablet   0   . naproxen sodium (ALEVE) 220 MG tablet   Oral   Take 220 mg by mouth daily as needed (for arthritic pain.).         Marland Kitchen nitroGLYCERIN (NITROSTAT) 0.4 MG SL tablet   Sublingual   Place 0.4 mg under the tongue every 5 (five) minutes as needed for chest pain. For chest pain         . pantoprazole (PROTONIX) 40 MG  tablet   Oral   Take 80 mg by mouth daily.         . potassium chloride (K-DUR) 10 MEQ tablet   Oral   Take 10 mEq by mouth daily.         Marland Kitchen  triamterene-hydrochlorothiazide (DYAZIDE) 37.5-25 MG per capsule      TAKE ONE CAPSULE BY MOUTH IN THE MORNING   30 capsule   0   . zolpidem (AMBIEN) 10 MG tablet   Oral   Take 5 mg by mouth daily as needed for sleep.          BP 128/71  Pulse 67  Temp(Src) 97.8 F (36.6 C) (Oral)  Resp 14  SpO2 98%  Physical Exam  Nursing note and vitals reviewed. Constitutional: She is oriented to person, place, and time. She appears well-developed and well-nourished. No distress.  HENT:  Head: Normocephalic and atraumatic.  Mouth/Throat: Oropharynx is clear and moist.  Eyes: Conjunctivae and EOM are normal. Pupils are equal, round, and reactive to light.  Neck: Normal range of motion. Neck supple.  Cardiovascular: Normal rate, regular rhythm and normal heart sounds.   Pulmonary/Chest: Effort normal and breath sounds normal. No respiratory distress. She has no wheezes.  Abdominal: Soft. Bowel sounds are normal. There is no tenderness. There is no guarding.  Musculoskeletal: Normal range of motion. She exhibits no edema.  Neurological: She is alert and oriented to person, place, and time.  Skin: Skin is warm and dry. She is not diaphoretic.  Psychiatric: She has a normal mood and affect.    ED Course  Procedures (including critical care time)   Date: 08/03/2013  Rate: 65  Rhythm: normal sinus rhythm  QRS Axis: normal  Intervals: normal  ST/T Wave abnormalities: normal  Conduction Disutrbances:none  Narrative Interpretation:   Old EKG Reviewed: unchanged   Labs Review Labs Reviewed  CBC WITH DIFFERENTIAL - Abnormal; Notable for the following:    RBC 5.28 (*)    All other components within normal limits  BASIC METABOLIC PANEL - Abnormal; Notable for the following:    Potassium 3.5 (*)    Chloride 95 (*)    GFR calc non Af Amer 85 (*)    All other components within normal limits  I-STAT TROPOININ, ED   Imaging Review Dg Chest 2 View  08/03/2013   CLINICAL DATA:  Chest  pain radiating to shoulder blades, nausea, dizziness, history hypertension, diabetes, coronary artery disease post PTCA  EXAM: CHEST  2 VIEW  COMPARISON:  03/15/2013  FINDINGS: Normal heart size, mediastinal contours, and pulmonary vascularity.  Mild chronic peribronchial thickening and question mild hyperinflation.  No acute infiltrate, pleural effusion or pneumothorax.  Bones unremarkable.  IMPRESSION: Mild bronchitic changes.   Electronically Signed   By: Lavonia Dana M.D.   On: 08/03/2013 14:33    EKG Interpretation   None       MDM   Final diagnoses:  Chest pain  Nausea   On review of medical records, patient has had numerous cardiac workups in the past without identifiable cause of her pain. She recently saw her cardiologist in followup 3 days ago and was doing well on her current medications and was instructed to continue those.  Pt had an episode of CP earlier today which she states was her anginal equivalent, completely resolved with 1 SL NTG and ASA.  On arrival, pt remains CP free, mild nausea.  Will obtain EKG, CXR, labs.  Zofran given.  EKG NSR,  no acute ischemic changes.  Trop negative.  Labs reassuring.  Nausea has resolved.  Patient has remained chest pain-free while in the emergency department. Will plan for delta troponin, if negative and patient remains chest pain free, feel she can be reasonably discharged with close cardiology followup.  Care signed out to [facility] at shift change-- will follow delta trop and dispo accordingly. Discussed with Dr. Dina Rich who agrees with assessment and plan of care.  Larene Pickett, PA-C 08/03/13 [phone]

## 2013-08-03 NOTE — Discharge Instructions (Signed)
Chest Pain (Nonspecific) °It is often hard to give a specific diagnosis for the cause of chest pain. There is always a chance that your pain could be related to something serious, such as a heart attack or a blood clot in the lungs. You need to follow up with your caregiver for further evaluation. °CAUSES  °· Heartburn. °· Pneumonia or bronchitis. °· Anxiety or stress. °· Inflammation around your heart (pericarditis) or lung (pleuritis or pleurisy). °· A blood clot in the lung. °· A collapsed lung (pneumothorax). It can develop suddenly on its own (spontaneous pneumothorax) or from injury (trauma) to the chest. °· Shingles infection (herpes zoster virus). °The chest wall is composed of bones, muscles, and cartilage. Any of these can be the source of the pain. °· The bones can be bruised by injury. °· The muscles or cartilage can be strained by coughing or overwork. °· The cartilage can be affected by inflammation and become sore (costochondritis). °DIAGNOSIS  °Lab tests or other studies, such as X-rays, electrocardiography, stress testing, or cardiac imaging, may be needed to find the cause of your pain.  °TREATMENT  °· Treatment depends on what may be causing your chest pain. Treatment may include: °· Acid blockers for heartburn. °· Anti-inflammatory medicine. °· Pain medicine for inflammatory conditions. °· Antibiotics if an infection is present. °· You may be advised to change lifestyle habits. This includes stopping smoking and avoiding alcohol, caffeine, and chocolate. °· You may be advised to keep your head raised (elevated) when sleeping. This reduces the chance of acid going backward from your stomach into your esophagus. °· Most of the time, nonspecific chest pain will improve within 2 to 3 days with rest and mild pain medicine. °HOME CARE INSTRUCTIONS  °· If antibiotics were prescribed, take your antibiotics as directed. Finish them even if you start to feel better. °· For the next few days, avoid physical  activities that bring on chest pain. Continue physical activities as directed. °· Do not smoke. °· Avoid drinking alcohol. °· Only take over-the-counter or prescription medicine for pain, discomfort, or fever as directed by your caregiver. °· Follow your caregiver's suggestions for further testing if your chest pain does not go away. °· Keep any follow-up appointments you made. If you do not go to an appointment, you could develop lasting (chronic) problems with pain. If there is any problem keeping an appointment, you must call to reschedule. °SEEK MEDICAL CARE IF:  °· You think you are having problems from the medicine you are taking. Read your medicine instructions carefully. °· Your chest pain does not go away, even after treatment. °· You develop a rash with blisters on your chest. °SEEK IMMEDIATE MEDICAL CARE IF:  °· You have increased chest pain or pain that spreads to your arm, neck, jaw, back, or abdomen. °· You develop shortness of breath, an increasing cough, or you are coughing up blood. °· You have severe back or abdominal pain, feel nauseous, or vomit. °· You develop severe weakness, fainting, or chills. °· You have a fever. °THIS IS AN EMERGENCY. Do not wait to see if the pain will go away. Get medical help at once. Call your local emergency services (911 in [place] Do not drive yourself to the hospital. °MAKE SURE YOU:  °· Understand these instructions. °· Will watch your condition. °· Will get help right away if you are not doing well or get worse. °Document Released: 03/09/2005 Document Revised: 08/22/2011 Document Reviewed: 01/03/2008 °ExitCare® Patient Information ©2014 [employer]   LL[employer]. °Heart Attack in Women °Heart attack (myocardial infarction) is one of the leading causes of sudden, unexpected death in women. A heart attack is damage to the heart that is not reversible. A heart attack usually occurs when a heart (coronary) artery becomes narrowed or blocked. The blockage cuts off blood supply to  the heart muscle. When one or more of the coronary arteries becomes blocked, that area of the heart begins to die. This can cause pain felt during a heart attack.  °If you think you might be having a heart attack, do not ignore your symptoms. Call your local emergency services (911 in [place] immediately. It is recommended that you take a 162 mg non-enteric coated aspirin if you do not have an aspirin allergy. Do not drive yourself to the hospital or wait to see if your symptoms go away. Early recognition of heart attack symptoms is critical. The sooner a heart attack is treated, the greater the amount of heart muscle saved. Time is muscle. It can save your life. °CAUSES  °A heart attack can occur from coronary artery disease (CAD). CAD is a process in which the coronary arteries narrow or become blocked from the development of atherosclerosis. Atherosclerosis is a disease in which plaque builds up on the inside of the coronary arteries. Plaque is made up of fats (lipids), cholesterol, calcium, and fibrous tissue. A heart attack can occur due to: °Plaque buildup that can severely narrow or block the coronary arteries and diminish blood flow. °Plaque that can become unstable and "rupture." Unstable plaque that ruptures within a coronary artery can form a clot and cause a sudden (acute) blockage of the coronary artery. °A severe tightening (spasm) of the coronary artery. This is a less common cause of a heart attack. When a coronary artery spasms, it cuts off blood flow through the coronary artery. Spasms can occur in coronary arteries that do not have atherosclerosis. °RISK FACTORS °In women, as the level of estrogen in the blood decreases after menopause, the risk of a heart attack increases. Other risk factors of heart attack in women include: °High blood pressure. °High cholesterol levels. °Menopause. °Smoking. °Obesity. °Diabetes. °Hysterectomy. °Previous heart attack. °Lack of regular exercise. °Family history of  heart attacks. °SYMPTOMS  °In women, heart attack symptoms may be different than those in men. Women may not experience the typical chest discomfort or pain, which is considered the primary heart attack symptom in men. Women may describe a feeling of pressure, ache, or tightness in the chest. Women may experience new or different physical symptoms 1 month or more before a heart attack. Unusual, unexplained fatigue may be the most frequently identified symptom. Sleep disturbances and weakness in the arms may also be considered warning signs.  °Other heart attack symptoms that may occur more often in women are: °Unexplained feelings of nervousness or anxiety. °Discomfort between the shoulder blades. °Tingling in the hands and arms. °Swollen arms. °Headaches. °Heart attack symptoms for both men and women include: °Pain or discomfort spreading to the neck, shoulder, arm, or jaw. °Shortness of breath. °Sudden cold sweats. °Pain or discomfort in the abdomen. °Heartburn or indigestion with or without vomiting. °Sudden lightheadedness. °Sudden fainting or blackout. °PREVENTION °The following healthy lifestyle habits may help decrease your risk of heart attacks: °Quitting smoking. °Keeping your blood pressure, blood sugar, and cholesterol levels within normal limits. °Maintaining a healthy weight. °Staying physically active and exercising regularly. °Decreasing your salt intake. °Eating a diet low in saturated fats and cholesterol. °Increasing   your fiber intake by including whole grains, vegetables, and fruits in your diet. °Avoiding situations that cause stress, anger, or depression. °Taking medicine as advised by your caregiver. °SEEK IMMEDIATE MEDICAL CARE IF:  °You have severe chest pain, especially if the pain is crushing or pressure-like and spreads to the arms, back, neck, or jaw. This is an emergency. Do not wait to see if the pain will go away. Call your local emergency services (911 in [place] immediately. Do not  drive yourself to the hospital. °You develop shortness of breath during rest, sleep, or with activity. °You have sudden, unexplained sweating or clammy skin. °You feel nauseous or vomit without cause. °You become lightheaded or dizzy. °You feel your heart beating rapidly or you notice your heart "skipping" beats. °MAKE SURE YOU:  °Understand these instructions. °Will watch your condition. °Will get help right away if you are not doing well or get worse. °Document Released: 11/26/2007 Document Revised: 11/29/2011 Document Reviewed: 09/01/2011 °ExitCare® Patient Information ©2014 [employer]. ° °

## 2013-08-03 NOTE — ED Provider Notes (Signed)
Medical screening examination/treatment/procedure(s) were performed by non-physician practitioner and as supervising physician I was immediately available for consultation/collaboration.  EKG Interpretation   None        Merryl Hacker, MD 08/03/13 [id number]

## 2013-08-04 NOTE — ED Provider Notes (Signed)
Medical screening examination/treatment/procedure(s) were performed by non-physician practitioner and as supervising physician I was immediately available for consultation/collaboration.   Borghild Thaker M Rayford Williamsen, MD 08/04/13 0238 

## 2013-08-05 ENCOUNTER — Telehealth: Payer: Self-pay | Admitting: Cardiology

## 2013-08-05 ENCOUNTER — Other Ambulatory Visit: Payer: Medicare HMO

## 2013-08-05 NOTE — Telephone Encounter (Signed)
Tell her that I will talk to Dr Lovena Le about the loop recorder.  The only definite thing I can offer right now would be 30 day monitor.

## 2013-08-05 NOTE — Telephone Encounter (Signed)
Pt states she was told to followup with cardiologist on Monday. I offered pt appt with PA/NP but she declined. She asked me to forward this info to Dr Aundra Dubin to let him know she almost passed out again(on Saturday, not since then). She states she has been told in the past by Dr Lovena Le that she is not a candidate for a loop recorder. I will forward to Dr Aundra Dubin for review.

## 2013-08-05 NOTE — Telephone Encounter (Signed)
Pt states she got up at 6AM Saturday morning and felt fine. She drank coffee and ate corn chips. Pt developed pressure squeezing and sharp pain in her chest. She almost passed out. She did take 1NTG. She was transported to [facility], evaluated, and discharged home after several hours. Pt told blood enzymes OK.

## 2013-08-05 NOTE — Telephone Encounter (Signed)
Pt aware that Dr Aundra Dubin talked with Dr Lovena Le.  Dr Lovena Le offered to work pt in to office this week to discuss loop recorder. Pt declined to schedule appt at this time. She said she would call back to do this.

## 2013-08-05 NOTE — Telephone Encounter (Signed)
Dr. Lovena Le said he would see her in the office this week to talk about loop recorder.  Could you get her scheduled with him? Thanks.

## 2013-08-05 NOTE — Telephone Encounter (Signed)
Pt declined 30 day event monitor. She suggested that Dr Aundra Dubin talk with Dr Lovena Le because she has been told time and time again by Dr Lovena Le that she is not a candidate for a loop recorder right now.

## 2013-08-05 NOTE — Telephone Encounter (Signed)
New Problem:  Pt states she went to the hospital over the weekend by ambulance. Pt does not want to set up a f/u appt. Pt wants to speak w/ Webb Silversmith.

## 2013-08-06 ENCOUNTER — Ambulatory Visit: Payer: Medicare HMO | Admitting: Internal Medicine

## 2013-08-13 ENCOUNTER — Ambulatory Visit (INDEPENDENT_AMBULATORY_CARE_PROVIDER_SITE_OTHER): Payer: Medicare HMO | Admitting: Internal Medicine

## 2013-08-13 ENCOUNTER — Encounter: Payer: Self-pay | Admitting: Internal Medicine

## 2013-08-13 VITALS — BP 138/86 | HR 87 | Ht 63.0 in | Wt 174.0 lb

## 2013-08-13 DIAGNOSIS — R55 Syncope and collapse: Secondary | ICD-10-CM

## 2013-08-13 DIAGNOSIS — I10 Essential (primary) hypertension: Secondary | ICD-10-CM
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
## 2013-08-13 NOTE — Assessment & Plan Note (Signed)
She appears to have had at least one episode of frank syncope and multiple episodes of near syncope of unclear etiology. A cardiac monitor would be a consideration but she is unable to wear one because of allergy to the adhesive. I have recommended insertion of an ILR. The risks/benefits/goals/expectations of the procedure have been discussed with the patient and she wishes to proceed.

## 2013-08-13 NOTE — Assessment & Plan Note (Signed)
Her blood pressure is minimally elevated. Will continue her current meds as I do not want to cause her to develop orthostasis with too tight of blood pressure control.

## 2013-08-13 NOTE — Patient Instructions (Addendum)
Will implant LINQ monitor on 08/21/13   Be at [facility] main Entrance at 9:30

## 2013-08-15 ENCOUNTER — Encounter (HOSPITAL_COMMUNITY): Payer: Self-pay

## 2013-08-15 ENCOUNTER — Other Ambulatory Visit: Payer: Medicare HMO

## 2013-08-19 ENCOUNTER — Telehealth: Payer: Self-pay | Admitting: Cardiology

## 2013-08-19 NOTE — Telephone Encounter (Signed)
New Message  Pt called, Requests a call back to determine if it is OK to come in for a lab on the same day of her procedure. Please call to discus

## 2013-08-19 NOTE — Telephone Encounter (Signed)
Spoke with patient and fasting lab appt rescheduled for 08/21/13. Pt missed lab appt last week.

## 2013-08-21 ENCOUNTER — Other Ambulatory Visit (INDEPENDENT_AMBULATORY_CARE_PROVIDER_SITE_OTHER): Payer: Medicare HMO

## 2013-08-21 ENCOUNTER — Ambulatory Visit (HOSPITAL_COMMUNITY)
Admission: RE | Admit: 2013-08-21 | Discharge: 2013-08-21 | Disposition: A | Discharge status: Home / Self Care | Payer: Medicare HMO | Source: Ambulatory Visit | Attending: Internal Medicine | Admitting: Internal Medicine

## 2013-08-21 ENCOUNTER — Encounter (HOSPITAL_COMMUNITY)
Admission: RE | Disposition: A | Discharge status: Home / Self Care | Payer: Self-pay | Source: Ambulatory Visit | Attending: Internal Medicine

## 2013-08-21 DIAGNOSIS — K589 Irritable bowel syndrome without diarrhea: Secondary | ICD-10-CM | POA: Insufficient documentation

## 2013-08-21 DIAGNOSIS — R000 Tachycardia, unspecified: Secondary | ICD-10-CM | POA: Insufficient documentation

## 2013-08-21 DIAGNOSIS — G894 Chronic pain syndrome: Secondary | ICD-10-CM | POA: Insufficient documentation

## 2013-08-21 DIAGNOSIS — E119 Type 2 diabetes mellitus without complications: Secondary | ICD-10-CM | POA: Insufficient documentation

## 2013-08-21 DIAGNOSIS — R002 Palpitations: Secondary | ICD-10-CM | POA: Insufficient documentation

## 2013-08-21 DIAGNOSIS — Z96659 Presence of unspecified artificial knee joint: Secondary | ICD-10-CM | POA: Insufficient documentation

## 2013-08-21 DIAGNOSIS — R55 Syncope and collapse: Secondary | ICD-10-CM

## 2013-08-21 DIAGNOSIS — M1990 Unspecified osteoarthritis, unspecified site: Secondary | ICD-10-CM | POA: Insufficient documentation

## 2013-08-21 DIAGNOSIS — M810 Age-related osteoporosis without current pathological fracture: Secondary | ICD-10-CM | POA: Insufficient documentation

## 2013-08-21 DIAGNOSIS — Z8541 Personal history of malignant neoplasm of cervix uteri: Secondary | ICD-10-CM | POA: Insufficient documentation

## 2013-08-21 DIAGNOSIS — G589 Mononeuropathy, unspecified: Secondary | ICD-10-CM | POA: Insufficient documentation

## 2013-08-21 DIAGNOSIS — I10 Essential (primary) hypertension: Secondary | ICD-10-CM

## 2013-08-21 DIAGNOSIS — I498 Other specified cardiac arrhythmias: Secondary | ICD-10-CM

## 2013-08-21 DIAGNOSIS — K219 Gastro-esophageal reflux disease without esophagitis: Secondary | ICD-10-CM | POA: Insufficient documentation

## 2013-08-21 HISTORY — PX: LOOP RECORDER IMPLANT: SHX5477

## 2013-08-21 HISTORY — PX: LOOP RECORDER IMPLANT: SHX5954

## 2013-08-21 LAB — BASIC METABOLIC PANEL
BUN: 14 mg/dL (ref 6–23)
CALCIUM: 9.8 mg/dL (ref 8.4–10.5)
CO2: 29 mEq/L (ref 19–32)
Chloride: 102 mEq/L (ref 96–112)
Creatinine, Ser: 0.9 mg/dL (ref 0.4–1.2)
GFR: 69.74 mL/min (ref >60.00)
GLUCOSE: 107 mg/dL — AB (ref 70–99)
Potassium: 3.5 mEq/L (ref 3.5–5.1)
SODIUM: 139 meq/L (ref 135–145)

## 2013-08-21 LAB — LIPID PANEL
Cholesterol: 146 mg/dL (ref 0–200)
HDL: 59.7 mg/dL (ref >39.00)
LDL CALC: 69 mg/dL (ref 0–99)
Total CHOL/HDL Ratio: 2
Triglycerides: 86 mg/dL (ref 0.0–149.0)
VLDL: 17.2 mg/dL (ref 0.0–40.0)

## 2013-08-21 LAB — MAGNESIUM: Magnesium: 1.8 mg/dL (ref 1.5–2.5)

## 2013-08-21 SURGERY — LOOP RECORDER IMPLANT
Anesthesia: LOCAL

## 2013-08-21 MED ORDER — LIDOCAINE-EPINEPHRINE 1 %-1:100000 IJ SOLN
INTRAMUSCULAR | Status: AC
  Filled 2013-08-21: qty 1

## 2013-08-21 NOTE — Discharge Instructions (Signed)
° °  Supplemental Discharge Instructions   following Loop Recorder Insertion  WOUND CARE   Keep the wound area clean and dry.  Do not get this area wet for one week. No showers for one week; you may shower on 08/29/2013.   The tape/steri-strips on your wound will fall off; do not pull them off.  No bandage is needed on the site.  DO  NOT apply any creams, oils, or ointments to the wound area.   If you notice any drainage or discharge from the wound, any swelling or bruising at the site, or you develop a fever > 101? F after you are discharged home, call the office at once.
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
## 2013-08-21 NOTE — Interval H&P Note (Signed)
History and Physical Interval Note: Since prior clinic visit, no change in the history, physical exam, assessment and plan.  08/21/2013 11:26 AM  [name]  has presented today for surgery, with the diagnosis of LINQ  The various methods of treatment have been discussed with the patient and family. After consideration of risks, benefits and other options for treatment, the patient has consented to  Procedure(s): LOOP RECORDER IMPLANT (N/A) as a surgical intervention .  The patient's history has been reviewed, patient examined, no change in status, stable for surgery.  I have reviewed the patient's chart and labs.  Questions were answered to the patient's satisfaction.     Cristopher Peru

## 2013-08-21 NOTE — CV Procedure (Signed)
Electrophysiology procedure note  Procedure: Insertion of an implantable loop recorder  Indication: Unexplained syncope  Description of procedure: After informed consent was obtained, the patient was prepped in the usual manner. 20 cc of lidocaine was infiltrated into the left pectoral area. A 1 cm stab incision was made, and a Medtronic implantable loop recorder, Link was inserted under the subcutaneous tissue. The serial number was [id number]. R waves measured 0.4 mV. Benzoin and Steri-Strip was pain on the skin. A pressure dressing was placed and the patient was recovered in the usual manner.  Complications: No immediate complications  Conclusion: Successful insertion of a Medtronic implantable loop recorder in a patient with unexplained syncope  Cristopher Peru, M.D.

## 2013-08-23 ENCOUNTER — Encounter (HOSPITAL_COMMUNITY): Payer: Self-pay | Admitting: *Deleted

## 2013-08-29 ENCOUNTER — Ambulatory Visit (INDEPENDENT_AMBULATORY_CARE_PROVIDER_SITE_OTHER): Payer: Medicare HMO | Admitting: *Deleted

## 2013-08-29 DIAGNOSIS — R55 Syncope and collapse: Secondary | ICD-10-CM

## 2013-08-29 LAB — MDC_IDC_ENUM_SESS_TYPE_INCLINIC

## 2013-08-29 NOTE — Progress Notes (Signed)
Wound check ILR.  Steri strips removed.  Wound is without redness or edema.  No episodes recorded.  ROV in June with Dr. Taylor. 

## 2013-09-10 ENCOUNTER — Encounter: Payer: Self-pay | Admitting: Internal Medicine

## 2013-09-12 ENCOUNTER — Encounter: Payer: Self-pay | Admitting: Internal Medicine

## 2013-09-17 ENCOUNTER — Telehealth: Payer: Self-pay | Admitting: Cardiology

## 2013-09-17 ENCOUNTER — Other Ambulatory Visit: Payer: Self-pay | Admitting: Pulmonary Disease

## 2013-09-17 MED ORDER — POTASSIUM CHLORIDE ER 10 MEQ PO CPCR: 10.0000 meq | ORAL_CAPSULE | Freq: Every day | ORAL | Status: DC

## 2013-09-17 NOTE — Telephone Encounter (Signed)
Patient request potassium capsule instead of tablet

## 2013-09-17 NOTE — Telephone Encounter (Signed)
New problem     Pt takes [facility] M10 ER 10 meq tab, (may have spelled it wrong)  but would like something called in in capsule form to her pharm. [place]  [employer]

## 2013-09-20 ENCOUNTER — Ambulatory Visit (INDEPENDENT_AMBULATORY_CARE_PROVIDER_SITE_OTHER): Payer: Medicare HMO | Admitting: *Deleted

## 2013-09-20 DIAGNOSIS — R55 Syncope and collapse: Secondary | ICD-10-CM

## 2013-09-20 LAB — MDC_IDC_ENUM_SESS_TYPE_REMOTE: MDC IDC SESS DTM: 20150423040500

## 2013-09-30 ENCOUNTER — Telehealth: Payer: Self-pay | Admitting: Pulmonary Disease

## 2013-09-30 NOTE — Telephone Encounter (Signed)
Spoke with pt.  Pt was attacked by dog over the weekend and bit several times on left lower leg.  Pt was seen by Orthopedic at [facility].  Is on abx and pain pill.  Was told she needs to see PCP within 7 days.  Pt given appt 10/02/13 at 11:00

## 2013-09-30 NOTE — Telephone Encounter (Signed)
lmomtcb x1 for pt 

## 2013-10-02 ENCOUNTER — Encounter: Payer: Self-pay | Admitting: Pulmonary Disease

## 2013-10-02 ENCOUNTER — Ambulatory Visit (INDEPENDENT_AMBULATORY_CARE_PROVIDER_SITE_OTHER): Payer: Medicare HMO | Admitting: Pulmonary Disease

## 2013-10-02 VITALS — BP 142/78 | HR 68 | Temp 98.6°F | Ht 63.0 in | Wt 169.2 lb

## 2013-10-02 DIAGNOSIS — M1990 Unspecified osteoarthritis, unspecified site: Secondary | ICD-10-CM

## 2013-10-02 DIAGNOSIS — M545 Low back pain, unspecified: Secondary | ICD-10-CM

## 2013-10-02 DIAGNOSIS — I498 Other specified cardiac arrhythmias: Secondary | ICD-10-CM

## 2013-10-02 DIAGNOSIS — I2510 Atherosclerotic heart disease of native coronary artery without angina pectoris: Secondary | ICD-10-CM

## 2013-10-02 DIAGNOSIS — F411 Generalized anxiety disorder: Secondary | ICD-10-CM

## 2013-10-02 DIAGNOSIS — R5381 Other malaise: Secondary | ICD-10-CM

## 2013-10-02 DIAGNOSIS — I10 Essential (primary) hypertension: Secondary | ICD-10-CM

## 2013-10-02 DIAGNOSIS — R5383 Other fatigue: Secondary | ICD-10-CM

## 2013-10-02 DIAGNOSIS — IMO0001 Reserved for inherently not codable concepts without codable children: Secondary | ICD-10-CM

## 2013-10-02 DIAGNOSIS — T148XXA Other injury of unspecified body region, initial encounter: Secondary | ICD-10-CM

## 2013-10-02 DIAGNOSIS — R51 Headache: Secondary | ICD-10-CM

## 2013-10-02 DIAGNOSIS — R519 Headache, unspecified: Secondary | ICD-10-CM

## 2013-10-02 DIAGNOSIS — W540XXA Bitten by dog, initial encounter: Secondary | ICD-10-CM

## 2013-10-02 DIAGNOSIS — G894 Chronic pain syndrome: Secondary | ICD-10-CM

## 2013-10-02 DIAGNOSIS — R55 Syncope and collapse: Secondary | ICD-10-CM

## 2013-10-02 NOTE — Patient Instructions (Signed)
Today we updated your med list in our EPIC system...    Continue your current medications the same...  Finish the Augmentin antibiotic given to you by the Orthopedists from [facility]...  Continue the cleansings  As directed by the [facility] physician team...  Keep your follow up appt w/ the Orthopedists regarding the wounds...  Be sure to eliminate the salt from your diet & elevate your legs...  If the swelling progresses & gets worse- you will need a venous doppler evaluation to rule out a blood clot in the deep venous system of the leg...    Call if the swelling gets worse or pain increases...    If it is at night or weekend- go to the ER for eval & the venous doppler screening...  Call for any questions.Marland KitchenMarland Kitchen

## 2013-10-02 NOTE — Progress Notes (Signed)
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 mEq total) by mouth daily.   TRIAMTERENE-HYDROCHLOROTHIAZIDE (DYAZIDE) 37.5-25 MG PER CAPSULE    Take 1 capsule by mouth daily.  Modified Medications   No medications on file  Discontinued Medications   DICYCLOMINE (BENTYL) 20 MG TABLET    Take 20 mg by mouth every morning.

## 2013-10-03 ENCOUNTER — Telehealth: Payer: Self-pay | Admitting: Pulmonary Disease

## 2013-10-03 MED ORDER — OXYCODONE-ACETAMINOPHEN 5-325 MG PO TABS: 1.0000 | ORAL_TABLET | Freq: Four times a day (QID) | ORAL | Status: DC | PRN

## 2013-10-03 NOTE — Telephone Encounter (Signed)
Per SN---  Ok to refill the percocet  #50   1 po every 6 hours as needed for pain.  How is her swelling?  She will need to pick the rx up.   Called and spoke with pt and she is aware of rx that has been left up front for her pt to pick up.  She stated that the swelling did go down with her leg elevated.  She stated that if she gets up for about 20 mins the leg will start to swell again.  Per SN ---cont to clean the leg like [facility] told her to, keep her leg elevated and no salt in her diet and follow up next week with her ortho doctor. Called and spoke with pt and she is aware.

## 2013-10-03 NOTE — Telephone Encounter (Signed)
Spoke with pt. She was seen yesterday by SN. She reports when she got home she realized she had only 1 tab of percocet left. She reprots this was giving to her when she was attacked by a dog and had to get evaluated. She still has pain from this reason she still takes the percocet. She see's ortho Monday but reports anytime she gets up to walk or do something she has a lot of pain from the area were she was bitten. Please advise SN on refill thanks

## 2013-10-07 DIAGNOSIS — S8990XA Unspecified injury of unspecified lower leg, initial encounter: Secondary | ICD-10-CM | POA: Insufficient documentation

## 2013-10-17 ENCOUNTER — Encounter: Payer: Self-pay | Admitting: Internal Medicine

## 2013-10-22 ENCOUNTER — Ambulatory Visit (INDEPENDENT_AMBULATORY_CARE_PROVIDER_SITE_OTHER): Payer: Medicare HMO | Admitting: *Deleted

## 2013-10-22 DIAGNOSIS — R55 Syncope and collapse: Secondary | ICD-10-CM

## 2013-10-25 ENCOUNTER — Telehealth: Payer: Self-pay | Admitting: Pulmonary Disease

## 2013-10-25 MED ORDER — SODIUM CHLORIDE 0.9 % IR SOLN: Status: DC

## 2013-10-25 NOTE — Telephone Encounter (Signed)
Spoke w/ pt. Requesting RX for 0.09% sodium chloride irrigation water sent to [facility] in [place]. She saw [facility] ortho for her dog bite bc leg was infected. She is now on abx. Her mother recently passed aware on 10/21/13 and had to cancel her [facility] appt and was not able to f/u with them. She is asking if we can call this in for her. Please advise SN thanks  Allergies  Allergen Reactions  . Other     Pt. States her skin blisters with use of EKG electrodes, even the "sensitive skin" types   . Moxifloxacin Other (See Comments)    REACTION: pt states she can't take Avelox... Unsure of reaction

## 2013-10-25 NOTE — Telephone Encounter (Signed)
lmomtcb x1 for pt RX has been sent for her

## 2013-10-25 NOTE — Telephone Encounter (Signed)
Per SN: Okay to refill. Needs to f/u with ortho ASAP next available. Pt needs to establish with new PCP. thanks

## 2013-10-28 NOTE — Telephone Encounter (Signed)
Pt has already picked up Rx and is very thankful to our office for helping her and sending in this medication. Pt states that she is following up with Ortho either today or tomorrow, they are trying to get her an appt to come in asap.   Nothing further needed.

## 2013-11-11 ENCOUNTER — Telehealth: Payer: Self-pay | Admitting: Gastroenterology

## 2013-11-11 ENCOUNTER — Other Ambulatory Visit (INDEPENDENT_AMBULATORY_CARE_PROVIDER_SITE_OTHER): Payer: Medicare HMO

## 2013-11-11 ENCOUNTER — Ambulatory Visit (INDEPENDENT_AMBULATORY_CARE_PROVIDER_SITE_OTHER): Payer: Medicare HMO | Admitting: Physician Assistant

## 2013-11-11 ENCOUNTER — Encounter: Payer: Self-pay | Admitting: Physician Assistant

## 2013-11-11 VITALS — BP 120/82 | HR 78 | Ht 63.0 in | Wt 162.2 lb

## 2013-11-11 DIAGNOSIS — K625 Hemorrhage of anus and rectum: Secondary | ICD-10-CM

## 2013-11-11 DIAGNOSIS — K649 Unspecified hemorrhoids: Secondary | ICD-10-CM

## 2013-11-11 DIAGNOSIS — Z8719 Personal history of other diseases of the digestive system: Secondary | ICD-10-CM

## 2013-11-11 DIAGNOSIS — K219 Gastro-esophageal reflux disease without esophagitis: Secondary | ICD-10-CM

## 2013-11-11 LAB — CBC WITH DIFFERENTIAL/PLATELET
Basophils Absolute: 0 10*3/uL (ref 0.0–0.1)
Basophils Relative: 0.5 % (ref 0.0–3.0)
EOS ABS: 0.1 10*3/uL (ref 0.0–0.7)
Eosinophils Relative: 1.6 % (ref 0.0–5.0)
HCT: 41 % (ref 36.0–46.0)
Hemoglobin: 13.5 g/dL (ref 12.0–15.0)
LYMPHS ABS: 2.1 10*3/uL (ref 0.7–4.0)
Lymphocytes Relative: 40.6 % (ref 12.0–46.0)
MCHC: 32.9 g/dL (ref 30.0–36.0)
MCV: 82.9 fl (ref 78.0–100.0)
Monocytes Absolute: 0.3 10*3/uL (ref 0.1–1.0)
Monocytes Relative: 5.8 % (ref 3.0–12.0)
NEUTROS PCT: 51.5 % (ref 43.0–77.0)
Neutro Abs: 2.6 10*3/uL (ref 1.4–7.7)
PLATELETS: 233 10*3/uL (ref 150.0–400.0)
RBC: 4.95 Mil/uL (ref 3.87–5.11)
RDW: 12.7 % (ref 11.5–15.5)
WBC: 5.1 10*3/uL (ref 4.0–10.5)

## 2013-11-11 LAB — BASIC METABOLIC PANEL
BUN: 9 mg/dL (ref 6–23)
CALCIUM: 9.8 mg/dL (ref 8.4–10.5)
CO2: 28 mEq/L (ref 19–32)
CREATININE: 0.9 mg/dL (ref 0.4–1.2)
Chloride: 104 mEq/L (ref 96–112)
GFR: 70.64 mL/min (ref >60.00)
Glucose, Bld: 84 mg/dL (ref 70–99)
POTASSIUM: 3.9 meq/L (ref 3.5–5.1)
Sodium: 139 mEq/L (ref 135–145)

## 2013-11-11 MED ORDER — RESTORA PO CAPS: 1.0000 | ORAL_CAPSULE | Freq: Every day | ORAL | Status: DC

## 2013-11-11 MED ORDER — VSL#3 PO PACK: 30.0000 | PACK | Freq: Every day | ORAL | Status: DC

## 2013-11-11 MED ORDER — ESOMEPRAZOLE MAGNESIUM 40 MG PO CPDR: 40.0000 mg | DELAYED_RELEASE_CAPSULE | Freq: Every day | ORAL | Status: DC

## 2013-11-11 MED ORDER — SACCHAROMYCES BOULARDII 250 MG PO CAPS: 250.0000 mg | ORAL_CAPSULE | Freq: Two times a day (BID) | ORAL | Status: DC

## 2013-11-11 MED ORDER — HYDROCORTISONE ACETATE 25 MG RE SUPP: 25.0000 mg | Freq: Two times a day (BID) | RECTAL | Status: DC

## 2013-11-11 NOTE — Progress Notes (Signed)
Reviewed and agree with management plan.  Kennet Mccort T. Lilyannah Zuelke, MD FACG 

## 2013-11-11 NOTE — Progress Notes (Signed)
Subjective:    Patient ID: Renee [name], female    DOB: 02-07-1946, 68 y.o.   MRN: [id number]  HPI Renee [name] is a pleasant 68 year old female known to Dr. Sharlett Iles previously. She has history of GERD, diverticulosis, fibromyalgia with chronic pain syndrome, IBS, and has had a remote radiofrequency ablation for a tachycardia. Also with hypertension . Patient last had colonoscopy in 2010 with finding of left colon diverticulosis and large internal hemorrhoids. Prior EGD had shown gastritis and duodenitis in 2008. Patient reports that she has been under a lot of stress over the past several months, her mother who she had been the primary caregiver for 4 the past few years passed away on 2022/10/29. They she relates that she had an episode of painless rectal bleeding on May 17 with bright red blood oozing from her rectum after she had taken a shower. She says after that she had seen some small amounts of bright red blood on the tissue with having a bowel movement. About 5 or 6 days ago she started noticing dark stools but also was having nausea and an upset stomach at that time. She had taken some Pepto-Bismol over the course of a couple of days which she said helped her stomach. She says she has been having problems with anxiety and difficulty sleeping since the death of her mother. Does take a baby aspirin everyday no daily NSAIDs but takes occasional Aleve. She also reports that she's had increasing heartburn and indigestion and does not feel that generic Protonix were clearly as well as Nexium had in the past. No current complaints of abdominal pain. She has not seen any obvious rectal bleeding since that episode on the 17th. On further history patient had had a dog bite in April of 2015 had a course of antibiotics after that and then a two-week course of antibiotics were prescribed at the end of April and she finished this in mid May. Her nausea has improved and she says her stools were not as dark  today.    Review of Systems  Constitutional: Positive for appetite change and fatigue.  HENT: Negative.   Respiratory: Negative.   Cardiovascular: Negative.   Gastrointestinal: Positive for abdominal pain and blood in stool.  Genitourinary: Negative.   Musculoskeletal: Negative.   Skin: Positive for wound.  Allergic/Immunologic: Negative.   Neurological: Negative.   Hematological: Negative.   Psychiatric/Behavioral: Positive for sleep disturbance. The patient is nervous/anxious.    Outpatient Prescriptions Prior to Visit  Medication Sig Dispense Refill  . ALPRAZolam (XANAX) 0.5 MG tablet Take 0.5 mg by mouth daily as needed for anxiety.      Marland Kitchen aspirin EC 81 MG tablet Take 81 mg by mouth every morning.       . Cholecalciferol (VITAMIN D3) 2000 UNITS TABS Take 4,000 Units by mouth daily.      Marland Kitchen dicyclomine (BENTYL) 20 MG tablet TAKE ONE TABLET BY MOUTH EVERY 6 HOURS  90 tablet  0  . diltiazem (CARDIZEM CD) 120 MG 24 hr capsule Take 120 mg by mouth daily.      Marland Kitchen estradiol (ESTRACE) 2 MG tablet Take 2 mg by mouth daily.       . flecainide (TAMBOCOR) 50 MG tablet Take 25 mg by mouth 2 (two) times daily.       . fluticasone (FLONASE) 50 MCG/ACT nasal spray Place 2 sprays into both nostrils daily as needed for allergies or rhinitis.      . medroxyPROGESTERone (PROVERA) 5  MG tablet Take 5 mg by mouth daily.      . naproxen sodium (ALEVE) 220 MG tablet Take 220 mg by mouth daily as needed (for arthritic pain.).      Marland Kitchen nitroGLYCERIN (NITROSTAT) 0.4 MG SL tablet Place 0.4 mg under the tongue every 5 (five) minutes as needed for chest pain. For chest pain      . oxyCODONE-acetaminophen (PERCOCET/ROXICET) 5-325 MG per tablet Take 1 tablet by mouth every 6 (six) hours as needed for severe pain.  50 tablet  0  . pantoprazole (PROTONIX) 40 MG tablet Take 80 mg by mouth daily.      . potassium chloride (MICRO-K) 10 MEQ CR capsule Take 1 capsule (10 mEq total) by mouth daily.  30 capsule  3  . sodium  chloride irrigation 0.9 % irrigation Use as directed  100 mL  0  . triamterene-hydrochlorothiazide (DYAZIDE) 37.5-25 MG per capsule Take 1 capsule by mouth daily.       No facility-administered medications prior to visit.   Allergies  Allergen Reactions  . Other     Pt. States her skin blisters with use of EKG electrodes, even the "sensitive skin" types   . Moxifloxacin Other (See Comments)    REACTION: pt states she can't take Avelox... Unsure of reaction   Patient Active Problem List   Diagnosis Date Noted  . Spells 05/06/2013  . Headache(784.0) 03/14/2013  . Preop cardiovascular exam 02/18/2013  . Eye problem 02/15/2013  . Persistent headaches 02/15/2013  . Chest pain 10/10/2011  . Pre-syncope 10/04/2011  . NSVT (nonsustained ventricular tachycardia) 10/04/2011  . DYSPNEA 10/01/2009  . DIVERTICULOSIS OF COLON 09/24/2009  . SWEATING 09/24/2009  . FATIQUE AND MALAISE 09/16/2009  . LONG QT SYNDROME 03/19/2009  . AV NODAL REENTRY TACHYCARDIA 10/03/2008  . OTHER DYSPHAGIA 06/17/2008  . HEMORRHOIDS 06/16/2008  . DUODENITIS, WITHOUT HEMORRHAGE 06/16/2008  . CONSTIPATION, CHRONIC 06/16/2008  . THYROID NODULE, right lobe. 05/26/2008  . CHRONIC PAIN SYNDROME 05/26/2008  . CORONARY ARTERY DISEASE 05/26/2008  . BRONCHITIS, RECURRENT 05/26/2008  . IRRITABLE BOWEL SYNDROME 05/26/2008  . FECAL INCONTINENCE 05/26/2008  . ANXIETY 03/24/2008  . HYPERTENSION 03/24/2008  . GERD 03/24/2008  . DEGENERATIVE JOINT DISEASE 03/24/2008  . BACK PAIN, LUMBAR 03/24/2008  . FIBROMYALGIA 03/24/2008  . CHEST PAIN 03/24/2008  . NEUROPATHY, HX OF 03/24/2008   History  Substance Use Topics  . Smoking status: Never Smoker   . Smokeless tobacco: Never Used  . Alcohol Use: Yes     Comment: Occasional    family history includes Asthma in her mother and sister; Diabetes in her brother, mother, and sister; Heart disease in her sister and sister; Lung cancer in her father; Ovarian cancer in her mother;  Stroke in her mother.    Objective:   Physical Exam  well-developed older white female in no acute distress, pleasant blood pressure 120/82 pulse 78 height 5 foot 3 weight 162. HEENT; nontraumatic normocephalic EOMI PERRLA sclera anicteric, Supple; no JVD, Cardiovascular ;regular rate and rhythm with S1-S2 no murmur or gallop, Pulmonary ;clear bilaterally, Abdomen; soft no focal tenderness no guarding or rebound no palpable mass or hepatosplenomegaly bowel sounds are active, cholecystectomy scar, Rectal; exam small external hemorrhoidal tag digital exam no lesion felt stool dark brown and heme-negative, Extremities; no clubbing cyanosis or edema she does have a dog bite wound on her left lower extremity,  which is covered Psych; mood and affect appropriate        Assessment & Plan:  #  42  68 year old female with history of IBS with recent episode of nausea abdominal discomfort and looser stools. Patient took Pepto-Bismol around this time and has noted black stools. On exam today stool is heme-negative and I suspect that she has not had any GI bleeding but noticed black stools do to Pepto-Bismol. Patient had also taken 2 courses of antibiotics recently and has been anxious and upset since the death of her mother both may have been contributing to upset stomach and nausea and recent change in bowel habits #2 GERD poorly controlled on Protonix #3 previously documented internal hemorrhoids #4 diverticular disease #5 fibromyalgia #6 chronic pain syndrome #7 hypertension  Plan; CBC with differential and BMET Stopped Pepto-Bismol and monitor for further dark stools patient is advised to call should black stool return Stop protonix  and switch to Nexium 40 mg by mouth twice daily x2 weeks then once daily thereafter Restart a probiotic on a daily basis, patient was given samples of Florastor today Anusol-HC suppositories at bedtime x7 days then as needed We briefly discussed in office hemorrhoidal  banding procedure which she would like to consider. If patient has recurrence of black stools will need to be reevaluated repeat labs and then consider endoscopic evaluation. She will be established with Dr. Fuller Plan

## 2013-11-11 NOTE — Patient Instructions (Signed)
We have sent the following medications to your pharmacy for you to pick up at your convenience: Nexium 40 mg daily, florastor take twice dailyol HC-Suppositories use daily at bedtime  Your physician has requested that you go to the basement for the following lab work before leaving today: CBC, Bmet

## 2013-11-11 NOTE — Telephone Encounter (Signed)
Patient does not have a preference for new GI MD. Patient calling to report rectal bleeding. States on 10/27/13 she had bright red blood coming from her rectum when she got out of the shower. She continues to have rectal bleeding. Also, reports her stomach is upset. She is having nausea and "just feels sick," Her mother passed away on 11/14/03 and she thought it was just her nerves but it is continuing. She also reports black, tarry stools. She does report she took Pepto bismol over the weekend. She has also been recently bitten by a dog and has been on antibiotics. Scheduled with Nicoletta Ba, PA today at 2:30 PM.

## 2013-11-21 ENCOUNTER — Other Ambulatory Visit: Payer: Self-pay

## 2013-11-22 ENCOUNTER — Ambulatory Visit (INDEPENDENT_AMBULATORY_CARE_PROVIDER_SITE_OTHER): Payer: Medicare HMO | Admitting: *Deleted

## 2013-11-22 DIAGNOSIS — R55 Syncope and collapse: Secondary | ICD-10-CM

## 2013-11-27 NOTE — Progress Notes (Signed)
Loop recorder 

## 2013-11-28 ENCOUNTER — Encounter: Payer: Self-pay | Admitting: Internal Medicine

## 2013-11-28 ENCOUNTER — Encounter: Payer: Self-pay | Admitting: *Deleted

## 2013-11-28 ENCOUNTER — Ambulatory Visit (INDEPENDENT_AMBULATORY_CARE_PROVIDER_SITE_OTHER): Payer: Medicare HMO | Admitting: Internal Medicine

## 2013-11-28 VITALS — BP 133/78 | HR 75 | Ht 64.0 in | Wt 161.0 lb

## 2013-11-28 DIAGNOSIS — I10 Essential (primary) hypertension: Secondary | ICD-10-CM

## 2013-11-28 DIAGNOSIS — R55 Syncope and collapse: Secondary | ICD-10-CM

## 2013-11-28 NOTE — Assessment & Plan Note (Signed)
Her blood pressure is well controlled. She will continue her current meds.  

## 2013-11-28 NOTE — Assessment & Plan Note (Signed)
The patient has had no episode of syncope since her ILR was placed. I have recommended she undergo watchful waiting. Will follow.
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
## 2013-11-28 NOTE — Patient Instructions (Signed)
Your physician recommends that you schedule a follow-up appointment in: 4 months with Dr Taylor  

## 2013-12-02 ENCOUNTER — Ambulatory Visit (INDEPENDENT_AMBULATORY_CARE_PROVIDER_SITE_OTHER): Payer: Medicare HMO | Admitting: Cardiology

## 2013-12-02 ENCOUNTER — Encounter: Payer: Self-pay | Admitting: Cardiology

## 2013-12-02 VITALS — BP 124/72 | HR 77 | Ht 64.0 in | Wt 160.0 lb

## 2013-12-02 DIAGNOSIS — IMO0001 Reserved for inherently not codable concepts without codable children: Secondary | ICD-10-CM

## 2013-12-02 DIAGNOSIS — R55 Syncope and collapse: Secondary | ICD-10-CM

## 2013-12-02 DIAGNOSIS — R569 Unspecified convulsions: Secondary | ICD-10-CM

## 2013-12-02 DIAGNOSIS — R079 Chest pain, unspecified: Secondary | ICD-10-CM

## 2013-12-02 DIAGNOSIS — I498 Other specified cardiac arrhythmias: Secondary | ICD-10-CM

## 2013-12-02 DIAGNOSIS — Q898 Other specified congenital malformations: Secondary | ICD-10-CM

## 2013-12-02 DIAGNOSIS — R6889 Other general symptoms and signs: Principal | ICD-10-CM

## 2013-12-02 NOTE — Patient Instructions (Signed)
Your physician wants you to follow-up in: 6 months with Dr Aundra Dubin. (December 2015). You will receive a reminder letter in the mail two months in advance. If you don't receive a letter, please call our office to schedule the follow-up appointment.
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
## 2013-12-03 NOTE — Addendum Note (Signed)
Addended by: Katrine Coho on: 12/03/2013 07:37 AM   Modules accepted: Orders

## 2013-12-11 ENCOUNTER — Other Ambulatory Visit: Payer: Self-pay

## 2013-12-11 DIAGNOSIS — Z1231 Encounter for screening mammogram for malignant neoplasm of breast: Secondary | ICD-10-CM

## 2013-12-23 ENCOUNTER — Ambulatory Visit (INDEPENDENT_AMBULATORY_CARE_PROVIDER_SITE_OTHER): Payer: Medicare HMO | Admitting: *Deleted

## 2013-12-23 DIAGNOSIS — R55 Syncope and collapse: Secondary | ICD-10-CM

## 2013-12-24 ENCOUNTER — Ambulatory Visit
Admission: RE | Admit: 2013-12-24 | Discharge: 2013-12-24 | Disposition: A | Discharge status: Home / Self Care | Payer: Medicare HMO | Source: Ambulatory Visit

## 2013-12-24 DIAGNOSIS — Z1231 Encounter for screening mammogram for malignant neoplasm of breast: Secondary | ICD-10-CM

## 2013-12-26 ENCOUNTER — Encounter: Payer: Self-pay | Admitting: Internal Medicine

## 2013-12-27 ENCOUNTER — Encounter: Payer: Self-pay | Admitting: Internal Medicine

## 2013-12-30 NOTE — Progress Notes (Signed)
Loop recorder 

## 2014-01-01 ENCOUNTER — Other Ambulatory Visit: Payer: Self-pay | Admitting: Obstetrics and Gynecology

## 2014-01-02 LAB — CYTOLOGY - PAP

## 2014-01-09 ENCOUNTER — Other Ambulatory Visit: Payer: Self-pay | Admitting: Dermatology

## 2014-01-11 ENCOUNTER — Other Ambulatory Visit: Payer: Self-pay | Admitting: Cardiology

## 2014-01-22 ENCOUNTER — Telehealth: Payer: Self-pay | Admitting: Cardiology

## 2014-01-22 ENCOUNTER — Ambulatory Visit (INDEPENDENT_AMBULATORY_CARE_PROVIDER_SITE_OTHER): Payer: Medicare HMO | Admitting: *Deleted

## 2014-01-22 DIAGNOSIS — R55 Syncope and collapse: Secondary | ICD-10-CM

## 2014-01-22 NOTE — Telephone Encounter (Signed)
New message    Patient calling wants the nurse to look at her transmitting for the last two days - not feeling well. Pulse is  62.

## 2014-01-23 ENCOUNTER — Telehealth: Payer: Self-pay | Admitting: Pulmonary Disease

## 2014-01-23 NOTE — Telephone Encounter (Signed)
Pt called stating that she wants to make Dr Lenna Gilford aware of a few things before her PV on 01/28/14 Pt states that she wants to makes Dr Lenna Gilford aware that this is not a "general" office visit--states it on her appt reminder but this is an appointment to find out what is wrong with her.  Pt states that she has been having increased issues of SOB, weakness, fatigue and paleness with the spells. Pt reports these spells are very sporadic and when they begin, they hit her hard.  Pt states that in the last 3 weeks she has experienced 2-3 dizzy spells.   Would like Dr Lenna Gilford to address this when she comes in on 01/28/14

## 2014-01-23 NOTE — Telephone Encounter (Signed)
Informed pt that loop recorder readings were normal and she had no episodes recently.

## 2014-01-23 NOTE — Telephone Encounter (Signed)
Informed pt that

## 2014-01-24 NOTE — Telephone Encounter (Signed)
SN is aware of message and will address all issues with pt at her visit.

## 2014-01-27 NOTE — Progress Notes (Signed)
Loop recorder 

## 2014-01-28 ENCOUNTER — Telehealth: Payer: Self-pay | Admitting: Cardiology

## 2014-01-28 ENCOUNTER — Encounter: Payer: Self-pay | Admitting: Pulmonary Disease

## 2014-01-28 ENCOUNTER — Ambulatory Visit (INDEPENDENT_AMBULATORY_CARE_PROVIDER_SITE_OTHER): Payer: Medicare HMO | Admitting: Pulmonary Disease

## 2014-01-28 VITALS — BP 144/80 | HR 66 | Temp 98.1°F | Ht 63.0 in | Wt 157.6 lb

## 2014-01-28 DIAGNOSIS — R5381 Other malaise: Secondary | ICD-10-CM

## 2014-01-28 DIAGNOSIS — K5909 Other constipation: Secondary | ICD-10-CM

## 2014-01-28 DIAGNOSIS — R1319 Other dysphagia: Secondary | ICD-10-CM

## 2014-01-28 DIAGNOSIS — IMO0001 Reserved for inherently not codable concepts without codable children: Secondary | ICD-10-CM

## 2014-01-28 DIAGNOSIS — R0602 Shortness of breath: Secondary | ICD-10-CM

## 2014-01-28 DIAGNOSIS — K219 Gastro-esophageal reflux disease without esophagitis: Secondary | ICD-10-CM

## 2014-01-28 DIAGNOSIS — M1990 Unspecified osteoarthritis, unspecified site: Secondary | ICD-10-CM

## 2014-01-28 DIAGNOSIS — I10 Essential (primary) hypertension: Secondary | ICD-10-CM

## 2014-01-28 DIAGNOSIS — R6889 Other general symptoms and signs: Principal | ICD-10-CM

## 2014-01-28 DIAGNOSIS — K5730 Diverticulosis of large intestine without perforation or abscess without bleeding: Secondary | ICD-10-CM

## 2014-01-28 DIAGNOSIS — G894 Chronic pain syndrome: Secondary | ICD-10-CM

## 2014-01-28 DIAGNOSIS — E041 Nontoxic single thyroid nodule: Secondary | ICD-10-CM

## 2014-01-28 DIAGNOSIS — Q898 Other specified congenital malformations: Secondary | ICD-10-CM

## 2014-01-28 DIAGNOSIS — R079 Chest pain, unspecified: Secondary | ICD-10-CM

## 2014-01-28 DIAGNOSIS — R569 Unspecified convulsions: Secondary | ICD-10-CM

## 2014-01-28 DIAGNOSIS — R5383 Other fatigue: Secondary | ICD-10-CM

## 2014-01-28 DIAGNOSIS — K589 Irritable bowel syndrome without diarrhea: Secondary | ICD-10-CM

## 2014-01-28 DIAGNOSIS — I498 Other specified cardiac arrhythmias: Secondary | ICD-10-CM

## 2014-01-28 DIAGNOSIS — F341 Dysthymic disorder: Secondary | ICD-10-CM | POA: Insufficient documentation

## 2014-01-28 MED ORDER — ALPRAZOLAM 0.5 MG PO TABS: ORAL_TABLET | ORAL | Status: DC

## 2014-01-28 NOTE — Telephone Encounter (Signed)
Spoke w/pt about episode. Will call pt first thing in am after the transmission comes thru on LINQ.

## 2014-01-28 NOTE — Telephone Encounter (Signed)
New message          Pt would like to know if you could look up her reading from Monday / pt had to take a nitro while she was at her PCP , Dr did do a EKG

## 2014-01-28 NOTE — Patient Instructions (Signed)
Today we updated your med list in our EPIC system...    Continue your current medications the same...  Please take the ALPRAZOLAM 0.5mg  tabs- 12 to 1 tab three times daily...  Today we reviewed your previous evaluations, and checked your Pulmonary function and ambulatory Oxygen levels...  We will arrange for a consultation with DrPlovsky et al for help w/ stress, depression & panic counseling to help you through this rough period...   We will also set up an appt w/ DrKerr for follow up of your thyroid nodules and the choking episodes you have experienced...  Call for any questions or if I can be of service in any way.Marland KitchenMarland Kitchen

## 2014-01-28 NOTE — Progress Notes (Signed)
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 indigestion/heartburn, back pain, joint swelling, muscle cramps, muscle weakness, arthritis, paresthesias, difficulty walking, and anxiety.  The patient denies fever, chills, anorexia, weight loss, sleep disorder, blurring, diplopia, eye irritation, eye discharge, vision loss, eye pain, photophobia, earache, ear discharge, tinnitus, decreased hearing, nosebleeds, sore throat, hoarseness, syncope, orthopnea, PND, peripheral edema, cough, dyspnea at rest, excessive sputum, hemoptysis, wheezing, pleurisy, vomiting, diarrhea, constipation, abdominal pain, melena, hematochezia, jaundice, gas/bloating, dysphagia, odynophagia, dysuria, hematuria, urinary frequency, urinary hesitancy, nocturia, incontinence, joint pain, stiffness, sciatica, restless legs, leg pain at night, leg pain with exertion, rash, itching, dryness, suspicious lesions, paralysis, seizures, tremors, vertigo, transient blindness, frequent falls, frequent headaches, depression, memory loss, confusion, cold intolerance, heat intolerance, polydipsia, polyphagia, polyuria, unusual weight change, abnormal bruising, bleeding, enlarged lymph nodes, urticaria, allergic rash, hay fever, and recurrent infections.     Objective:   Physical Exam      WD, WN, 68 y/o Renee [name] in NAD... GENERAL:  Alert & oriented; pleasant & cooperative... HEENT:   [place]/AT, EOM-wnl, PERRLA, EACs-clear, TMs-wnl, NOSE-clear, THROAT-clear & wnl. NECK:  Supple w/ fairROM; no JVD; normal carotid impulses w/o bruits; no thyromegaly or nodules palpated; no lymphadenopathy. CHEST:  Clear to P & A; without wheezes/ rales/ or rhonchi heard; mult trigger points of FM... HEART:  Regular Rhythm; without murmurs/ rubs/ or gallops detected... ABDOMEN:  Soft & nontender; normal bowel sounds; no organomegaly or masses palpated... EXT: without deformities or arthritic changes; no varicose veins/ +venous insuffic/ no edema; mult trigger points... NEURO:  CN's intact; motor testing normal; sensory testing normal; gait normal & balance OK. DERM:  No lesions noted; no rash etc...  RADIOLOGY DATA:  Reviewed in the EPIC EMR & discussed w/ the patient...  LABORATORY DATA:  Reviewed in the EPIC EMR & discussed w/ the patient...   Assessment:      Hx spells w/ neg extensive medical evaluations; in my opinion these episodes represent anxiety/ stress/ pain disorder superimposed on situational depression; she has agreed to see Psychiatry DrPlovsky for consultation, medication management, and counseling services... In the interim she will start on Alprazolam 0.$RemoveBeforeD'5mg'[id number]$ - 1/2 to 1 tab po Tid...   Hx Recurrent Bronchitic infections>  None recently & her Spirometry 7 ambulatory O2 test are NEG/ normal; she is reassured...  HBP>  Controlled on Cardizem120, Dyazide & K10...  CP/ CAD/ Hx tachyarrhythmia, ?long QT syndrome>  Followed by DrMcLean now & DrTaylor for Cards; on Tambocor & has loop recorder in place...  Hx Thyroid Nodule>  followed by DrKerr & DrDNewman w/ mult prev Bx etc- all benign => she requests f/u eval by DrKerr to recheck her thyroid...  GI> GERD, dysphagia, Divertics, IBS, etc>  Followed by DrPatterson/ [facility]  for GI... And DrShoemaker for  ENT..Marland Kitchen  DJD, LBP, FM, Chr pain syndrome>  Followed by Amie Critchley at [facility] & she has seen [facility] here in [place]...  Hx DOG BITE>  She had  hematoma right lat thigh area and sutures laceration left lat calf area; dressings as instructed by [facility] ER & Ortho teams; this gradually improved & resolved...  Anxiety/ stress/ panic disorder/ r/o depression>  As documented above...   Plan:     Patient's Medications  New Prescriptions   No medications on file  Previous Medications   ASPIRIN EC 81 MG TABLET    Take 81 mg by mouth every morning.    CHOLECALCIFEROL (VITAMIN D3) 2000 UNITS TABS    Take 4,000 Units by mouth daily.   DIETARY MANAGEMENT PRODUCT (VSL#3) PACK    Take 30 each by mouth daily.   DILTIAZEM (CARDIZEM CD) 120 MG 24 HR CAPSULE    TAKE ONE CAPSULE BY MOUTH ONCE DAILY   ESOMEPRAZOLE (NEXIUM) 40 MG CAPSULE    Take 1 capsule (40 mg total) by mouth daily at 12 noon.   FLECAINIDE (TAMBOCOR) 50 MG TABLET    Take 25 mg by mouth 2 (two) times daily.    FLUTICASONE (FLONASE) 50 MCG/ACT NASAL SPRAY    Place 2 sprays into both nostrils daily as needed for allergies or rhinitis.   NAPROXEN SODIUM (ALEVE) 220 MG TABLET    Take 220 mg by mouth daily as needed (for arthritic pain.).   NITROGLYCERIN (NITROSTAT) 0.4 MG SL TABLET    Place 0.4 mg under the tongue every 5 (five) minutes as needed for chest pain. For chest pain   PANTOPRAZOLE (PROTONIX) 40 MG TABLET    Take 80 mg by mouth daily.   PAROXETINE MESYLATE (BRISDELLE) 7.5 MG CAPS    Take 1 capsule by mouth at bedtime.   POTASSIUM CHLORIDE (MICRO-K) 10 MEQ CR CAPSULE    Take 1 capsule (10 mEq total) by mouth daily.   TRIAMTERENE-HYDROCHLOROTHIAZIDE (DYAZIDE) 37.5-25 MG PER CAPSULE    Take 1 capsule by mouth daily.   ZOLPIDEM (AMBIEN) 10 MG TABLET    Take 10 mg by mouth at bedtime as needed for sleep.  Modified Medications   Modified Medication Previous Medication   ALPRAZOLAM (XANAX) 0.5 MG TABLET ALPRAZolam (XANAX) 0.5 MG tablet      Take 1/2 to 1 tablet three times daily as directed    Take 0.5 mg by mouth daily as needed for anxiety.  Discontinued Medications   ESTRADIOL  (ESTRACE) 2 MG TABLET    Take 2 mg by mouth daily.    HYDROCORTISONE (ANUSOL-HC) 25 MG SUPPOSITORY    Place 1 suppository (25 mg total) rectally 2 (two) times daily.   MEDROXYPROGESTERONE (PROVERA) 5 MG TABLET    Take 5 mg by mouth daily.

## 2014-01-30 ENCOUNTER — Telehealth: Payer: Self-pay | Admitting: Pulmonary Disease

## 2014-01-30 ENCOUNTER — Encounter: Payer: Self-pay | Admitting: Internal Medicine

## 2014-01-30 NOTE — Telephone Encounter (Signed)
lmomtcb x 2  

## 2014-01-30 NOTE — Telephone Encounter (Signed)
Patient returning call.  [phone]

## 2014-01-30 NOTE — Telephone Encounter (Signed)
lmomtcb x1 

## 2014-01-31 ENCOUNTER — Encounter: Payer: Self-pay | Admitting: Internal Medicine

## 2014-01-31 NOTE — Telephone Encounter (Signed)
Pt aware of rec's per Dr Lenna Gilford Nothing further needed.

## 2014-01-31 NOTE — Telephone Encounter (Signed)
Pt states that she was seen by Dr Lenna Gilford 01/25/14 Taking 1/2 tablet (.25mg ) of xanax TID Pt states that she was very "out of it" and was unable to perform normal daily functions. Pt states that she laid down on the cough and was unable to move until the medication worse off.  Pt takes Ambien 10mg  as well.  Pt was advised by heart Dr to stop Xanax asap d/t her heart condition.  Pt wanting to let Dr Lenna Gilford know that she cannot take this Rx'd dose as she is unable to function.  Please advise. Thanks.

## 2014-01-31 NOTE — Telephone Encounter (Signed)
Per SN---  recs that she see Dr. Casimiro Needle to discuss other medication alternatives and medication management

## 2014-02-04 ENCOUNTER — Telehealth: Payer: Self-pay | Admitting: Internal Medicine

## 2014-02-04 NOTE — Telephone Encounter (Signed)
New message      Pt has a loop recorder.  She is having one of her "spells"--extreme fatigue, bp 144/89 and heart rate 95-96.  She want to make sure this is being recorded.  Please call pt

## 2014-02-04 NOTE — Telephone Encounter (Signed)
Spoke w/pt in regards to episode from today. No episodes recorded on linq. Pt sent a manuel transmission. Pt instructed to use patient assistance (activator) when these episodes occur.

## 2014-02-14 ENCOUNTER — Encounter: Payer: Self-pay | Admitting: Internal Medicine

## 2014-02-17 ENCOUNTER — Encounter: Payer: Self-pay | Admitting: Internal Medicine

## 2014-02-18 ENCOUNTER — Telehealth: Payer: Self-pay | Admitting: Pulmonary Disease

## 2014-02-18 ENCOUNTER — Ambulatory Visit (INDEPENDENT_AMBULATORY_CARE_PROVIDER_SITE_OTHER): Payer: Medicare HMO | Admitting: Pulmonary Disease

## 2014-02-18 ENCOUNTER — Encounter: Payer: Self-pay | Admitting: Pulmonary Disease

## 2014-02-18 VITALS — BP 124/82 | HR 56 | Temp 97.9°F | Ht 63.0 in | Wt 155.8 lb

## 2014-02-18 DIAGNOSIS — J069 Acute upper respiratory infection, unspecified: Secondary | ICD-10-CM

## 2014-02-18 DIAGNOSIS — J029 Acute pharyngitis, unspecified: Secondary | ICD-10-CM

## 2014-02-18 DIAGNOSIS — R5381 Other malaise: Secondary | ICD-10-CM

## 2014-02-18 DIAGNOSIS — R5383 Other fatigue: Secondary | ICD-10-CM

## 2014-02-18 DIAGNOSIS — I10 Essential (primary) hypertension: Secondary | ICD-10-CM

## 2014-02-18 DIAGNOSIS — I498 Other specified cardiac arrhythmias: Secondary | ICD-10-CM

## 2014-02-18 DIAGNOSIS — R079 Chest pain, unspecified: Secondary | ICD-10-CM

## 2014-02-18 MED ORDER — DOXYCYCLINE HYCLATE 100 MG PO TABS: 100.0000 mg | ORAL_TABLET | Freq: Two times a day (BID) | ORAL | Status: DC

## 2014-02-18 MED ORDER — AZITHROMYCIN 250 MG PO TABS: ORAL_TABLET | ORAL | Status: DC

## 2014-02-18 MED ORDER — FIRST-DUKES MOUTHWASH MT SUSP: OROMUCOSAL | Status: DC

## 2014-02-18 NOTE — Telephone Encounter (Signed)
I have sent in new RX. Called verified with [facility] sent in. Nothing further needed

## 2014-02-18 NOTE — Progress Notes (Signed)
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 Moxifloxacin Other (See Comments)    REACTION: pt states she can't take Avelox... Unsure of reaction  . Quinolones     Years ago as a adult but no recollection of reaction type.    Current Medications, Allergies, Past Medical History, Past Surgical History, Family History, and Social History were reviewed in [employer] record.     Review of Systems        The patient complains of sweats, fatigue, weakness, malaise, nasal congestion, chest pain, palpitations, dyspnea on exertion, nausea, change in bowel habits, indigestion/heartburn, back pain, joint swelling, muscle cramps, muscle weakness, arthritis, paresthesias, difficulty walking, and anxiety.  The patient denies fever, chills, anorexia, weight loss, sleep disorder, blurring, diplopia, eye irritation, eye discharge, vision loss, eye pain, photophobia, earache, ear discharge, tinnitus, decreased hearing, nosebleeds, sore throat, hoarseness, syncope, orthopnea, PND, peripheral edema, cough, dyspnea at rest, excessive sputum, hemoptysis, wheezing, pleurisy, vomiting, diarrhea, constipation, abdominal pain, melena, hematochezia, jaundice, gas/bloating, dysphagia, odynophagia, dysuria, hematuria, urinary frequency, urinary hesitancy, nocturia, incontinence, joint pain, stiffness, sciatica, restless legs, leg pain at night, leg pain with exertion, rash, itching, dryness, suspicious lesions, paralysis, seizures, tremors,  vertigo, transient blindness, frequent falls, frequent headaches, depression, memory loss, confusion, cold intolerance, heat intolerance, polydipsia, polyphagia, polyuria, unusual weight change, abnormal bruising, bleeding, enlarged lymph nodes, urticaria, allergic rash, hay fever, and recurrent infections.     Objective:   Physical Exam      WD, WN, 68 y/o WF in NAD... GENERAL:  Alert & oriented; pleasant & cooperative... HEENT:  [place]/AT, EOM-wnl, PERRLA, EACs-clear, TMs-wnl, NOSE-clear, THROAT-sl red w/ cryptic tonsls, no exud... NECK:  Supple w/ fairROM; no JVD; normal carotid impulses w/o bruits; no thyromegaly or nodules palpated; no lymphadenopathy. CHEST:  Clear to P & A; without wheezes/ rales/ or rhonchi heard; mult trigger points of FM... HEART:  Regular Rhythm; without murmurs/ rubs/ or gallops detected... ABDOMEN:  Soft & nontender; normal bowel sounds; no organomegaly or masses palpated... EXT: without deformities or arthritic changes; no varicose veins/ +venous insuffic/ no edema; mult trigger points... NEURO:  CN's intact; motor testing normal; sensory testing normal; gait normal & balance OK. DERM:  No lesions noted; no rash etc...  RADIOLOGY DATA:  Reviewed in the EPIC EMR & discussed w/ the patient...  LABORATORY DATA:  Reviewed in the EPIC EMR & discussed w/ the patient...   Assessment:     URI, Pharyngitis>  We decided to treat w/ ZPak, MMW, Mucinex/Fluids, Delsym; Pharm called w/ concern for Zithromax w/ her flecainide therefore ch to doxy$RemoveB'100mg'bJkWBXTe$ Bid...   Hx spells w/ neg extensive medical evaluations; in my opinion these episodes represent anxiety/ stress/ pain disorder superimposed on situational depression; she has agreed to see Psychiatry DrPlovsky for  consultation, medication management, and counseling services... In the interim she will start on Alprazolam 0.$RemoveBeforeD'5mg'hUJRTdcEfJIraN$ - 1/2 to 1 tab po Tid...  Hx Recurrent Bronchitic infections>  None recently & her Spirometry 7 ambulatory  O2 test are NEG/ normal; she is reassured...  HBP>  Controlled on Cardizem120, Dyazide & K10...  CP/ CAD/ Hx tachyarrhythmia, ?long QT syndrome>  Followed by DrMcLean now & DrTaylor for Cards; on Tambocor & has loop recorder in place...  Hx Thyroid Nodule>  followed by DrKerr & DrDNewman w/ mult prev Bx etc- all benign => she requests f/u eval by DrKerr to recheck her thyroid...  GI> GERD, dysphagia, Divertics, IBS, etc>  Followed by DrPatterson/ [facility]  for GI... And DrShoemaker for ENT...  DJD, LBP, FM, Chr pain syndrome>  Followed by Amie Critchley at [facility] & she has seen [facility] here in [place]...  Hx DOG BITE>  She had hematoma right lat thigh area and sutures laceration left lat calf area; dressings as instructed by [facility] ER & Ortho teams; this gradually improved & resolved...  Anxiety/ stress/ panic disorder/ r/o depression>  As documented above...   Plan:     Patient's Medications  New Prescriptions   DIPHENHYD-HYDROCORT-NYSTATIN (FIRST-DUKES MOUTHWASH) SUSP    Gargle and swallow 1 tsp four times daily as needed   DOXYCYCLINE (VIBRA-TABS) 100 MG TABLET    Take 1 tablet (100 mg total) by mouth 2 (two) times daily.  Previous Medications   ALPRAZOLAM (XANAX) 0.5 MG TABLET    Take 1/2 to 1 tablet three times daily as directed   ASPIRIN EC 81 MG TABLET    Take 81 mg by mouth every morning.    CHOLECALCIFEROL (VITAMIN D3) 2000 UNITS TABS    Take 4,000 Units by mouth daily.   DIETARY MANAGEMENT PRODUCT (VSL#3) PACK    Take 30 each by mouth daily.   DILTIAZEM (CARDIZEM CD) 120 MG 24 HR CAPSULE    TAKE ONE CAPSULE BY MOUTH ONCE DAILY   ESOMEPRAZOLE (NEXIUM) 40 MG CAPSULE    Take 1 capsule (40 mg total) by mouth daily at 12 noon.   FLECAINIDE (TAMBOCOR) 50 MG TABLET    Take 25 mg by mouth 2 (two) times daily.    FLUTICASONE (FLONASE) 50 MCG/ACT NASAL SPRAY    Place 2 sprays into both nostrils daily as needed for allergies or rhinitis.   NAPROXEN SODIUM (ALEVE) 220 MG TABLET    Take 220 mg by mouth  daily as needed (for arthritic pain.).   NITROGLYCERIN (NITROSTAT) 0.4 MG SL TABLET    Place 0.4 mg under the tongue every 5 (five) minutes as needed for chest pain. For chest pain   PAROXETINE MESYLATE (BRISDELLE) 7.5 MG CAPS    Take 1 capsule by mouth at bedtime.   POTASSIUM CHLORIDE (MICRO-K) 10 MEQ CR CAPSULE    Take 1 capsule (10 mEq total) by mouth daily.   TRIAMTERENE-HYDROCHLOROTHIAZIDE (DYAZIDE) 37.5-25 MG PER CAPSULE    Take 1 capsule by mouth daily.   ZOLPIDEM (AMBIEN) 10 MG TABLET    Take 10 mg by mouth at bedtime as needed for sleep.  Modified Medications   No medications on file  Discontinued Medications   AZITHROMYCIN (ZITHROMAX) 250 MG TABLET    Take as directed   PANTOPRAZOLE (PROTONIX) 40 MG TABLET    Take 80 mg by mouth daily.

## 2014-02-18 NOTE — Telephone Encounter (Signed)
Called spoke with Sam from [facility]. Reports showed interaction b/w the azithromycin and tambacor. Reports it can cause prolonged QT interval. Please advise SN thanks

## 2014-02-18 NOTE — Patient Instructions (Signed)
Today we updated your med list in our EPIC system...    Continue your current medications the same...  For your upper resp infection>>    We wrote a new prescription for a ZPak (Zithromax) antibiotic to take as directed...    We also wrote for the MAGIC MOUTHWASH- take 1 tsp, gargle & swallow up to 4 times daily for the sore throat...    Get the OTC MUCINEX 600mg - one tab 4 times daily w/ plenty of fluids...    You may also use the OTC DELSYM cough syrup- 2 tsp twice daily to suppress the cough...  Rest at home, drink lots of fluids...    And try some hot herbal  Tea w/ a little lemon & honey...  Call for any questions.Marland KitchenMarland Kitchen

## 2014-02-18 NOTE — Telephone Encounter (Signed)
Called and spoke with pt and she is aware of the interaction between the zpak and her other medication.  Pt is aware that this is why her medication was changed.  Nothing further is needed.

## 2014-02-18 NOTE — Telephone Encounter (Signed)
Per SN---  Change to doxycycline 100 mg  #14  1 po bid.  thanks

## 2014-02-19 LAB — MDC_IDC_ENUM_SESS_TYPE_REMOTE
Date Time Interrogation Session: 20150507182418
MDC IDC SET ZONE DETECTION INTERVAL: 2000 ms
MDC IDC SET ZONE DETECTION INTERVAL: 3000 ms
Zone Setting Detection Interval: 370 ms

## 2014-02-21 ENCOUNTER — Other Ambulatory Visit: Payer: Self-pay | Admitting: *Deleted

## 2014-02-21 ENCOUNTER — Ambulatory Visit (INDEPENDENT_AMBULATORY_CARE_PROVIDER_SITE_OTHER): Payer: Medicare HMO | Admitting: *Deleted

## 2014-02-21 ENCOUNTER — Encounter: Payer: Self-pay | Admitting: Internal Medicine

## 2014-02-21 ENCOUNTER — Telehealth: Payer: Self-pay | Admitting: Internal Medicine

## 2014-02-21 DIAGNOSIS — R55 Syncope and collapse: Secondary | ICD-10-CM

## 2014-02-21 LAB — MDC_IDC_ENUM_SESS_TYPE_REMOTE: Date Time Interrogation Session: 20150610040500

## 2014-02-21 NOTE — Telephone Encounter (Signed)
New problem   Pt has loop recorder and not feeling well and need to speak to nurse very soon.

## 2014-02-21 NOTE — Telephone Encounter (Addendum)
She has an URI and was seen by Dr Lenna Gilford and she was giving an antibiotic and feels terrible.  I have asked her to contact her PCP in regards to her URI.  She says the entire reason for her call today is to see what her Cruz Condon is reporting.  I have asked her to send in a transmission so we can see if there is anything going on with her heart.  She is sending now.    After waiting for several minutes and having Emeline Darling, RN nurse manager speak with her we still had not received report.  Wyona Almas with the device clinic called her to check on her and see how she was sending her transmissions.  She was apparently not taught correctly at implant and Erasmo Downer had to instruct her the correct way.  I will forward to [facility] for for her imput and the outcome with the patient

## 2014-02-23 LAB — MDC_IDC_ENUM_SESS_TYPE_REMOTE: Date Time Interrogation Session: 20150723040500

## 2014-02-24 NOTE — Telephone Encounter (Signed)
Spoke w/pt 02-21-14 pm---pt was using the patient assist activator to send transmission which was incorrect. Instructed on how to send transmission with [employer] monitor. Transmission was received showing symptom episodes recorded from 02-21-14 x 2 and  02-20-14 two symptom episodes recorded. Pt has been sick all week and also unable to take medications. Pt was instructed to start back on medications especially Flecainide. Symptom episodes were printed out for GT review.

## 2014-02-25 LAB — MDC_IDC_ENUM_SESS_TYPE_REMOTE: Date Time Interrogation Session: 20150818040500

## 2014-02-27 NOTE — Progress Notes (Signed)
Loop recorder 

## 2014-03-04 ENCOUNTER — Telehealth: Payer: Self-pay | Admitting: Pulmonary Disease

## 2014-03-04 NOTE — Telephone Encounter (Signed)
Spoke with pt. She c/o dry cough, chest congestion, chest tx x Saturday. Denies any wheezing, no f/s/n/v/c. She is taking mucinex QD but doesn't seem to help. She is also taking OTC cough syrup. Pt wants something called in. Please advise SN thanks  Allergies  Allergen Reactions  . Other     Pt. States her skin blisters with use of EKG electrodes, even the "sensitive skin" types   . Cyclobenzaprine Other (See Comments)    Lethargy  . Moxifloxacin Other (See Comments)    REACTION: pt states she can't take Avelox... Unsure of reaction  . Quinolones     Years ago as a adult but no recollection of reaction type.

## 2014-03-04 NOTE — Telephone Encounter (Signed)
Per SN---  Ok to give the pt hycodan #6oz  1 tsp every 6 hours as needed for cough with no refills.  She will have to come tomorrow and pick this up.  thanks

## 2014-03-04 NOTE — Telephone Encounter (Signed)
Can give generic for tussionex, or can get tussionex if we call and get it approved through her insurance. However she needs something today. She will not wait any longer - per pt Please call back at [phone]

## 2014-03-04 NOTE — Telephone Encounter (Signed)
lmomtcb x1 

## 2014-03-04 NOTE — Telephone Encounter (Signed)
Per SN---  She should use mucinex 600 ,mg  2 po bid with plenty of fluids Robitussin is on or delsym otc is ok.   She can get tussionex #4oz 1 tsp every 12 hours as needed if she wants.  This may be more expensive as insurance does not cover this.

## 2014-03-04 NOTE — Telephone Encounter (Signed)
Pt aware of recs. She wants to call the pharmacy and see how much the tussionex is before we print this off.  Will await call back

## 2014-03-04 NOTE — Telephone Encounter (Signed)
Spoke with the pt  She states that Tussionex is not covered by insurance  I rec that she take delsym and she states that she has tried already and this does not help at all  She tried to get Robitussin at her local pharm, but they did not have this in stock  Please advise on alternative thanks!

## 2014-03-05 ENCOUNTER — Other Ambulatory Visit: Payer: Self-pay | Admitting: Internal Medicine

## 2014-03-05 DIAGNOSIS — E042 Nontoxic multinodular goiter: Secondary | ICD-10-CM

## 2014-03-05 MED ORDER — HYDROCODONE-HOMATROPINE 5-1.5 MG/5ML PO SYRP: 5.0000 mL | ORAL_SOLUTION | Freq: Four times a day (QID) | ORAL | Status: DC | PRN

## 2014-03-05 NOTE — Telephone Encounter (Signed)
Pt to come pick up Hycodan syrup this PM Rx printed, signed and placed up front.  Nothing further needed.

## 2014-03-05 NOTE — Telephone Encounter (Signed)
Pt states that she will be up here to pick up Rx this afternoon.  States that Hycodan is not what she asked for-- states that she requested Tussionex Cough Syrup  Pt state that she has heart problems and wants SN to be 100% sure this will not cause issues since she has always taken Tussionex. We have no history of cough narc cough meds being sent in the past (pt states that we will have to call her insurance to tell them that its medically necessary that she take the Tussionex in order for them to cover it)  Advised pt that I will run this by Dr Lenna Gilford before printing Rx. Pt states that she will be here around 3:30 today

## 2014-03-06 ENCOUNTER — Other Ambulatory Visit: Payer: Self-pay | Admitting: Cardiology

## 2014-03-06 ENCOUNTER — Encounter: Payer: Self-pay | Admitting: Internal Medicine

## 2014-03-07 ENCOUNTER — Encounter: Payer: Self-pay | Admitting: Internal Medicine

## 2014-03-11 LAB — MDC_IDC_ENUM_SESS_TYPE_REMOTE
MDC IDC SESS DTM: 20150911202903
MDC IDC SET ZONE DETECTION INTERVAL: 3000 ms
Zone Setting Detection Interval: 2000 ms
Zone Setting Detection Interval: 370 ms

## 2014-03-13 ENCOUNTER — Ambulatory Visit
Admission: RE | Admit: 2014-03-13 | Discharge: 2014-03-13 | Disposition: A | Discharge status: Home / Self Care | Payer: Medicare HMO | Source: Ambulatory Visit | Attending: Internal Medicine | Admitting: Internal Medicine

## 2014-03-13 DIAGNOSIS — E042 Nontoxic multinodular goiter: Secondary | ICD-10-CM

## 2014-03-24 ENCOUNTER — Ambulatory Visit (INDEPENDENT_AMBULATORY_CARE_PROVIDER_SITE_OTHER): Payer: Medicare HMO | Admitting: *Deleted

## 2014-03-24 DIAGNOSIS — R55 Syncope and collapse: Secondary | ICD-10-CM

## 2014-03-24 LAB — MDC_IDC_ENUM_SESS_TYPE_REMOTE
Date Time Interrogation Session: 20151014050042
Zone Setting Detection Interval: 2000 ms
Zone Setting Detection Interval: 3000 ms
Zone Setting Detection Interval: 370 ms

## 2014-03-25 ENCOUNTER — Other Ambulatory Visit: Payer: Self-pay | Admitting: Pulmonary Disease

## 2014-03-26 NOTE — Progress Notes (Signed)
Loop recorder 

## 2014-03-28 ENCOUNTER — Other Ambulatory Visit: Payer: Self-pay

## 2014-03-30 ENCOUNTER — Encounter (HOSPITAL_COMMUNITY): Payer: Self-pay | Admitting: Emergency Medicine

## 2014-03-30 ENCOUNTER — Emergency Department (HOSPITAL_COMMUNITY)
Admission: EM | Admit: 2014-03-30 | Discharge: 2014-03-30 | Disposition: A | Discharge status: Home / Self Care | Payer: Medicare HMO | Attending: Emergency Medicine | Admitting: Emergency Medicine

## 2014-03-30 ENCOUNTER — Emergency Department (HOSPITAL_COMMUNITY): Payer: Medicare HMO

## 2014-03-30 DIAGNOSIS — Z7982 Long term (current) use of aspirin: Secondary | ICD-10-CM | POA: Insufficient documentation

## 2014-03-30 DIAGNOSIS — I471 Supraventricular tachycardia: Secondary | ICD-10-CM | POA: Insufficient documentation

## 2014-03-30 DIAGNOSIS — G894 Chronic pain syndrome: Secondary | ICD-10-CM | POA: Diagnosis not present

## 2014-03-30 DIAGNOSIS — Z8709 Personal history of other diseases of the respiratory system: Secondary | ICD-10-CM | POA: Diagnosis not present

## 2014-03-30 DIAGNOSIS — E119 Type 2 diabetes mellitus without complications: Secondary | ICD-10-CM | POA: Diagnosis not present

## 2014-03-30 DIAGNOSIS — I10 Essential (primary) hypertension: Secondary | ICD-10-CM | POA: Insufficient documentation

## 2014-03-30 DIAGNOSIS — Z79899 Other long term (current) drug therapy: Secondary | ICD-10-CM | POA: Insufficient documentation

## 2014-03-30 DIAGNOSIS — Z8669 Personal history of other diseases of the nervous system and sense organs: Secondary | ICD-10-CM | POA: Insufficient documentation

## 2014-03-30 DIAGNOSIS — K219 Gastro-esophageal reflux disease without esophagitis: Secondary | ICD-10-CM | POA: Insufficient documentation

## 2014-03-30 DIAGNOSIS — M1990 Unspecified osteoarthritis, unspecified site: Secondary | ICD-10-CM | POA: Insufficient documentation

## 2014-03-30 DIAGNOSIS — R51 Headache: Secondary | ICD-10-CM | POA: Diagnosis not present

## 2014-03-30 DIAGNOSIS — Z859 Personal history of malignant neoplasm, unspecified: Secondary | ICD-10-CM | POA: Insufficient documentation

## 2014-03-30 DIAGNOSIS — R519 Headache, unspecified: Secondary | ICD-10-CM

## 2014-03-30 DIAGNOSIS — R42 Dizziness and giddiness: Secondary | ICD-10-CM | POA: Diagnosis not present

## 2014-03-30 LAB — COMPREHENSIVE METABOLIC PANEL
ALK PHOS: 75 U/L (ref 39–117)
ALT: 17 U/L (ref 0–35)
AST: 23 U/L (ref 0–37)
Albumin: 4.5 g/dL (ref 3.5–5.2)
Anion gap: 13 (ref 5–15)
BUN: 10 mg/dL (ref 6–23)
CHLORIDE: 101 meq/L (ref 96–112)
CO2: 26 meq/L (ref 19–32)
Calcium: 9.9 mg/dL (ref 8.4–10.5)
Creatinine, Ser: 0.7 mg/dL (ref 0.50–1.10)
GFR calc Af Amer: >90 mL/min (ref >90)
GFR calc non Af Amer: 87 mL/min — ABNORMAL LOW (ref >90)
Glucose, Bld: 93 mg/dL (ref 70–99)
POTASSIUM: 3.6 meq/L — AB (ref 3.7–5.3)
SODIUM: 140 meq/L (ref 137–147)
Total Bilirubin: 1.6 mg/dL — ABNORMAL HIGH (ref 0.3–1.2)
Total Protein: 7.2 g/dL (ref 6.0–8.3)

## 2014-03-30 LAB — DIFFERENTIAL
Basophils Absolute: 0 10*3/uL (ref 0.0–0.1)
Basophils Relative: 0 % (ref 0–1)
EOS ABS: 0 10*3/uL (ref 0.0–0.7)
Eosinophils Relative: 1 % (ref 0–5)
LYMPHS ABS: 1 10*3/uL (ref 0.7–4.0)
LYMPHS PCT: 18 % (ref 12–46)
Monocytes Absolute: 0.1 10*3/uL (ref 0.1–1.0)
Monocytes Relative: 2 % — ABNORMAL LOW (ref 3–12)
NEUTROS PCT: 79 % — AB (ref 43–77)
Neutro Abs: 4.2 10*3/uL (ref 1.7–7.7)

## 2014-03-30 LAB — CBC
HCT: 37.7 % (ref 36.0–46.0)
Hemoglobin: 12.6 g/dL (ref 12.0–15.0)
MCH: 27.3 pg (ref 26.0–34.0)
MCHC: 33.4 g/dL (ref 30.0–36.0)
MCV: 81.6 fL (ref 78.0–100.0)
PLATELETS: 235 10*3/uL (ref 150–400)
RBC: 4.62 MIL/uL (ref 3.87–5.11)
RDW: 13 % (ref 11.5–15.5)
WBC: 5.3 10*3/uL (ref 4.0–10.5)

## 2014-03-30 LAB — PROTIME-INR
INR: 1.05 (ref 0.00–1.49)
Prothrombin Time: 13.8 seconds (ref 11.6–15.2)

## 2014-03-30 LAB — I-STAT TROPONIN, ED: TROPONIN I, POC: 0.01 ng/mL (ref 0.00–0.08)

## 2014-03-30 LAB — APTT: aPTT: 30 seconds (ref 24–37)

## 2014-03-30 LAB — SEDIMENTATION RATE: Sed Rate: 2 mm/hr (ref 0–22)

## 2014-03-30 MED ORDER — TETRACAINE HCL 0.5 % OP SOLN
2.0000 [drp] | Freq: Once | OPHTHALMIC | Status: AC
  Administered 2014-03-30: 2 [drp] via OPHTHALMIC
  Filled 2014-03-30: qty 2

## 2014-03-30 MED ORDER — SODIUM CHLORIDE 0.9 % IV BOLUS (SEPSIS)
1000.0000 mL | Freq: Once | INTRAVENOUS | Status: AC
  Administered 2014-03-30: 1000 mL via INTRAVENOUS

## 2014-03-30 MED ORDER — IOHEXOL 350 MG/ML SOLN
50.0000 mL | Freq: Once | INTRAVENOUS | Status: AC | PRN
  Administered 2014-03-30: 50 mL via INTRAVENOUS

## 2014-03-30 MED ORDER — HYDROCODONE-ACETAMINOPHEN 5-325 MG PO TABS: 2.0000 | ORAL_TABLET | ORAL | Status: DC | PRN

## 2014-03-30 NOTE — ED Provider Notes (Signed)
CSN: [id number]     Arrival date & time 03/30/14  1513 History   First MD Initiated Contact with Patient 03/30/14 1614     Chief Complaint  Patient presents with  . Headache     (Consider location/radiation/quality/duration/timing/severity/associated sxs/prior Treatment) HPI Comments: Patient presents with a headache 5 AM. Was in the right side of her head associated with nausea and photophobia. She thinks that her right eye was drooping. She thinks this headache lasted about 4 hours and was unrelieved by Aleve. She went back to sleep and the headache came on again this afternoon with quick onset. EMS was called by her family due to her severe head pain. She is feeling better now. She took multiple Aleve with no relief. She denies any vomiting, fever, chest pain or shortness of breath. She denies any dizziness, lightheadedness. She reports similar headache on Thursday lasted 2 hours and resolved. No history of migraines. History of similar headache in 2011 with negative workup. She is not taking anticoagulants. States she has been diagnosed with "cluster migraines" which correlated with her menses when she was younger.  The history is provided by the patient and a relative.    Past Medical History  Diagnosis Date  . Bronchitis     Recurrent  . Hypertension   . Chronic chest pain     a. 2005 Cath: Mild nonobstructive plaque (30-40% LCX);  b. 08/2011 low risk myoview;  c. 09/2011 Coronary CT: NL cors w ca score of 0.  . AV nodal re-entry tachycardia     a. s/p RFA 1993- Dr. Rolland Porter, [place]  . Long QT interval     a. mild - advised to avoid meds that may prolong QT.  Marland Kitchen GERD (gastroesophageal reflux disease)   . Dysphagia   . Duodenitis without hemorrhage   . Diverticulosis of colon   . Irritable bowel syndrome   . Constipation, chronic   . Hemorrhoids   . Fecal incontinence   . Thyroid nodule   . Degenerative joint disease     a. s/p L Total Knee Arthroplasty.  . Fibromyalgia   .  Chronic pain syndrome   . Neuropathy   . Anxiety   . Pre-syncope   . Cancer     hx of cervical   ca  . SVT (supraventricular tachycardia)     a. nonsustained SVT - previously offered flecainide but refused;  b. 12/2012 30 day event monitor w/o significant arrhythmias.  . Osteoporosis   . Diet-controlled type 2 diabetes mellitus    Past Surgical History  Procedure Laterality Date  . Biopsy thyroid    . Total hip arthroplasty  10/10    Left, at [facility]  . Hysteroscopy  2002    with resection of endometrial polyps. by Dr.MCPhail  . Coronary angioplasty  2007  . Cardiac electrophysiology study and ablation      atrioventricular nodal reentant tachycardia  . Ablation of dysrhythmic focus  1993  . Cholecystectomy  1980  . Orif radial head / neck fracture  09/05/88  . Colonoscopy  06/25/2008    Diverticulosis and Hemorrhoids  . Esophagogastroduodenoscopy    . Loop recorder implant  08/21/2013    MDT LinQ implanted by Dr Lovena Le for syncope   Family History  Problem Relation Age of Onset  . Ovarian cancer Mother   . Stroke Mother   . Diabetes Mother   . Asthma Mother   . Diabetes Sister     Pacemaker, CHF  . Heart  disease Sister   . Asthma Sister   . Lung cancer Father     Heart problems  . Heart disease Sister   . Diabetes Brother    History  Substance Use Topics  . Smoking status: Never Smoker   . Smokeless tobacco: Never Used  . Alcohol Use: Yes     Comment: Occasional    OB History   Grav Para Term Preterm Abortions TAB SAB Ect Mult Living   '2 2        2     '$ Review of Systems  Constitutional: Negative for fever, activity change and appetite change.  HENT: Negative for congestion and rhinorrhea.   Eyes: Positive for photophobia. Negative for visual disturbance.  Respiratory: Negative for cough, chest tightness and shortness of breath.   Cardiovascular: Negative for chest pain.  Gastrointestinal: Negative for nausea, vomiting and abdominal pain.   Genitourinary: Negative for dysuria, hematuria, vaginal bleeding and vaginal discharge.  Musculoskeletal: Negative for arthralgias and myalgias.  Skin: Negative for rash.  Neurological: Positive for light-headedness and headaches. Negative for dizziness and weakness.  A complete 10 system review of systems was obtained and all systems are negative except as noted in the HPI and PMH.      Allergies  Cyclobenzaprine; Adhesive; Moxifloxacin; and Quinolones  Home Medications   Prior to Admission medications   Medication Sig Start Date End Date Taking? Authorizing Provider  ALPRAZolam Duanne Moron) 0.5 MG tablet Take 0.25-0.5 mg by mouth 2 (two) times daily as needed for anxiety.   Yes Historical Provider, MD  aspirin EC 81 MG tablet Take 81 mg by mouth every morning.    Yes Historical Provider, MD  Cholecalciferol (VITAMIN D3) 2000 UNITS TABS Take 4,000 Units by mouth daily.   Yes Historical Provider, MD  dicyclomine (BENTYL) 20 MG tablet Take 20 mg by mouth every 6 (six) hours.   Yes Historical Provider, MD  diltiazem (CARDIZEM CD) 120 MG 24 hr capsule Take 120 mg by mouth daily.   Yes Historical Provider, MD  flecainide (TAMBOCOR) 50 MG tablet Take 25 mg by mouth 2 (two) times daily.  04/01/13  Yes Evans Lance, MD  naproxen sodium (ALEVE) 220 MG tablet Take 220 mg by mouth daily as needed (for arthritic pain.).   Yes Historical Provider, MD  nitroGLYCERIN (NITROSTAT) 0.4 MG SL tablet Place 0.4 mg under the tongue every 5 (five) minutes as needed for chest pain. For chest pain   Yes Historical Provider, MD  pantoprazole (PROTONIX) 40 MG tablet Take 80 mg by mouth daily.   Yes Historical Provider, MD  potassium chloride (MICRO-K) 10 MEQ CR capsule Take 1 capsule (10 mEq total) by mouth daily. 09/17/13  Yes Larey Dresser, MD  triamterene-hydrochlorothiazide (DYAZIDE) 37.5-25 MG per capsule Take 1 capsule by mouth daily.   Yes Historical Provider, MD  zolpidem (AMBIEN) 10 MG tablet Take 10 mg by  mouth at bedtime as needed for sleep.   Yes Historical Provider, MD  HYDROcodone-acetaminophen (NORCO/VICODIN) 5-325 MG per tablet Take 2 tablets by mouth every 4 (four) hours as needed. 03/30/14   Ezequiel Essex, MD  PARoxetine Mesylate (BRISDELLE) 7.5 MG CAPS Take 1 capsule by mouth at bedtime.    Historical Provider, MD   BP 151/84  Pulse 99  Temp(Src) 98.2 F (36.8 C) (Oral)  Resp 13  Ht 5' 3.5" (1.613 m)  Wt 155 lb (70.308 kg)  BMI 27.02 kg/m2  SpO2 96% Physical Exam  Nursing note and vitals reviewed. Constitutional:  She is oriented to person, place, and time. She appears well-developed and well-nourished. No distress.  HENT:  Head: Normocephalic and atraumatic.  Mouth/Throat: Oropharynx is clear and moist. No oropharyngeal exudate.  No temporal artery tenderness  Eyes: Conjunctivae and EOM are normal. Pupils are equal, round, and reactive to light.  IOP R 15, IOP L 17  Neck: Normal range of motion. Neck supple.  No meningismus. No carotid bruit  Cardiovascular: Normal rate, regular rhythm, normal heart sounds and intact distal pulses.   No murmur heard. Pulmonary/Chest: Effort normal and breath sounds normal. No respiratory distress.  Abdominal: Soft. There is no tenderness. There is no rebound and no guarding.  Musculoskeletal: Normal range of motion. She exhibits no edema and no tenderness.  Neurological: She is alert and oriented to person, place, and time. No cranial nerve deficit. She exhibits normal muscle tone. Coordination normal.  No ataxia on finger to nose bilaterally. No pronator drift. 5/5 strength throughout. CN 2-12 intact. Negative Romberg. Equal grip strength. Sensation intact. Gait is normal. No nystagmus  Skin: Skin is warm.  Psychiatric: She has a normal mood and affect. Her behavior is normal.    ED Course  Procedures (including critical care time) Labs Review Labs Reviewed  DIFFERENTIAL - Abnormal; Notable for the following:    Neutrophils  Relative % 79 (*)    Monocytes Relative 2 (*)    All other components within normal limits  COMPREHENSIVE METABOLIC PANEL - Abnormal; Notable for the following:    Potassium 3.6 (*)    Total Bilirubin 1.6 (*)    GFR calc non Af Amer 87 (*)    All other components within normal limits  PROTIME-INR  APTT  CBC  SEDIMENTATION RATE  I-STAT TROPOININ, ED    Imaging Review Ct Angio Head W/cm &/or Wo Cm  03/30/2014   CLINICAL DATA:  Sudden onset of headache this morning. Unrelieved by pain medicine. Nausea. No focal neurological deficits. Possible drooping of the right eye.  EXAM: CT ANGIOGRAPHY HEAD AND NECK  TECHNIQUE: Multidetector CT imaging of the head and neck was performed using the standard protocol during bolus administration of intravenous contrast. Multiplanar CT image reconstructions and MIPs were obtained to evaluate the vascular anatomy. Carotid stenosis measurements (when applicable) are obtained utilizing NASCET criteria, using the distal internal carotid diameter as the denominator.  CONTRAST:  2mL OMNIPAQUE IOHEXOL 350 MG/ML SOLN  COMPARISON:  Head CT ear earlier same day.  MRI 03/04/2013.  FINDINGS: CTA HEAD FINDINGS  Head CT again does not show any acute finding. There are a few areas of low density in the cerebral hemispheric white matter consistent with chronic small vessel disease previously demonstrated at MRI. No sign of intracranial hemorrhage.  Both internal carotid arteries are patent at the skullbase. The anterior and middle cerebral vessels are patent bilaterally without proximal stenosis, aneurysm or vascular malformation.  Both vertebral arteries are patent with the right being dominant. The left vertebral artery terminates in PICA. There is no basilar stenosis. Posterior circulation branch vessels appear normal. No posterior circulation aneurysm.  Intracranial venous structures appear patent and normal.  Review of the MIP images confirms the above findings.  CTA NECK  FINDINGS  The aorta is tortuous. Branching pattern of the brachiocephalic vessels from the arch shows the normal variant of the left vertebral artery arising directly from the arch. No origin stenoses.  The right common carotid artery is widely patent to the bifurcation. The carotid bifurcation is normal without stenosis or  irregularity. The cervical internal carotid artery is widely patent.  The left common carotid artery is tortuous but widely patent to the bifurcation. No carotid bifurcation stenosis or irregularity. The left cervical internal carotid artery is tortuous beneath the skullbase with slight irregularity that could indicate mild fibromuscular disease. No significant stenosis or pseudo aneurysm.  The right vertebral artery takes an early origin from the subclavian, just beyond the bifurcation of the innominate. This is the dominant vessel widely patent through the neck. Left vertebral artery origin from the arch appears widely patent. This vessel appears normal throughout its course to determine a shin and PICA.  Soft tissue evaluation of the neck shows normal appearance of the left lobe of the thyroid. There is a dominant lesion of the right lobe of the thyroid with solid and cystic components measuring 2.3 x 1.7 cm in transverse diameter and showing a length of 3 cm. This was evaluated by ultrasound on 03/13/2014.  There is chronic spondylosis of the lower cervical spine, possibly with old minor compression deformity at C7.  Review of the MIP images confirms the above findings.  IMPRESSION: No large or medium vessel intracranial occlusion or correctable proximal stenosis.  No carotid bifurcation disease or other acquired atherosclerotic disease in the neck.  Left cervical ICA is tortuous beneath the skullbase in shows slight irregularity suggesting mild fibromuscular dysplasia in this region. No stenosis or pseudoaneurysm however.  Dominant cystic and solid nodule of the right lobe of the thyroid  measuring 2.3 x 1.7 x 3 cm. This was evaluated by ultrasound on 03/13/2014.   Electronically Signed   By: Nelson Chimes M.D.   On: 03/30/2014 18:05   Ct Head (brain) Wo Contrast  03/30/2014   CLINICAL DATA:  Sudden onset of headache since 5:30am that woke her up. States headache feels better now than earlier. Reports earlier she was having pain to right side of face as well and felt like right eye was swollen. States right eye was drooping when she looked in mirror  EXAM: CT HEAD WITHOUT CONTRAST  TECHNIQUE: Contiguous axial images were obtained from the base of the skull through the vertex without intravenous contrast.  COMPARISON:  MRI brain 03/04/2013  FINDINGS: There is no evidence of mass effect, midline shift or extra-axial fluid collections. There is no evidence of a space-occupying lesion or intracranial hemorrhage. There is no evidence of a cortical-based area of acute infarction. Mild periventricular white matter low attenuation likely secondary to microangiopathy.  The ventricles and sulci are appropriate for the patient's age. The basal cisterns are patent.  Visualized portions of the orbits are unremarkable. Small right maxillary sinus mucous retention cyst. The visualized portions of the remainder of the paranasal sinuses and mastoid air cells are unremarkable.  The osseous structures are unremarkable.  IMPRESSION: No acute intracranial pathology.   Electronically Signed   By: Kathreen Devoid   On: 03/30/2014 17:28   Ct Angio Neck W/cm &/or Wo/cm  03/30/2014   CLINICAL DATA:  Sudden onset of headache this morning. Unrelieved by pain medicine. Nausea. No focal neurological deficits. Possible drooping of the right eye.  EXAM: CT ANGIOGRAPHY HEAD AND NECK  TECHNIQUE: Multidetector CT imaging of the head and neck was performed using the standard protocol during bolus administration of intravenous contrast. Multiplanar CT image reconstructions and MIPs were obtained to evaluate the vascular anatomy.  Carotid stenosis measurements (when applicable) are obtained utilizing NASCET criteria, using the distal internal carotid diameter as the denominator.  CONTRAST:  83mL OMNIPAQUE IOHEXOL 350 MG/ML SOLN  COMPARISON:  Head CT ear earlier same day.  MRI 03/04/2013.  FINDINGS: CTA HEAD FINDINGS  Head CT again does not show any acute finding. There are a few areas of low density in the cerebral hemispheric white matter consistent with chronic small vessel disease previously demonstrated at MRI. No sign of intracranial hemorrhage.  Both internal carotid arteries are patent at the skullbase. The anterior and middle cerebral vessels are patent bilaterally without proximal stenosis, aneurysm or vascular malformation.  Both vertebral arteries are patent with the right being dominant. The left vertebral artery terminates in PICA. There is no basilar stenosis. Posterior circulation branch vessels appear normal. No posterior circulation aneurysm.  Intracranial venous structures appear patent and normal.  Review of the MIP images confirms the above findings.  CTA NECK FINDINGS  The aorta is tortuous. Branching pattern of the brachiocephalic vessels from the arch shows the normal variant of the left vertebral artery arising directly from the arch. No origin stenoses.  The right common carotid artery is widely patent to the bifurcation. The carotid bifurcation is normal without stenosis or irregularity. The cervical internal carotid artery is widely patent.  The left common carotid artery is tortuous but widely patent to the bifurcation. No carotid bifurcation stenosis or irregularity. The left cervical internal carotid artery is tortuous beneath the skullbase with slight irregularity that could indicate mild fibromuscular disease. No significant stenosis or pseudo aneurysm.  The right vertebral artery takes an early origin from the subclavian, just beyond the bifurcation of the innominate. This is the dominant vessel widely patent  through the neck. Left vertebral artery origin from the arch appears widely patent. This vessel appears normal throughout its course to determine a shin and PICA.  Soft tissue evaluation of the neck shows normal appearance of the left lobe of the thyroid. There is a dominant lesion of the right lobe of the thyroid with solid and cystic components measuring 2.3 x 1.7 cm in transverse diameter and showing a length of 3 cm. This was evaluated by ultrasound on 03/13/2014.  There is chronic spondylosis of the lower cervical spine, possibly with old minor compression deformity at C7.  Review of the MIP images confirms the above findings.  IMPRESSION: No large or medium vessel intracranial occlusion or correctable proximal stenosis.  No carotid bifurcation disease or other acquired atherosclerotic disease in the neck.  Left cervical ICA is tortuous beneath the skullbase in shows slight irregularity suggesting mild fibromuscular dysplasia in this region. No stenosis or pseudoaneurysm however.  Dominant cystic and solid nodule of the right lobe of the thyroid measuring 2.3 x 1.7 x 3 cm. This was evaluated by ultrasound on 03/13/2014.   Electronically Signed   By: Nelson Chimes M.D.   On: 03/30/2014 18:05     EKG Interpretation   Date/Time:  Sunday March 30 2014 16:23:59 EDT Ventricular Rate:  64 PR Interval:  166 QRS Duration: 92 QT Interval:  433 QTC Calculation: 447 R Axis:   71 Text Interpretation:  Sinus rhythm Low voltage, precordial leads  Borderline repolarization abnormality No significant change was found  Confirmed by Wyvonnia Dusky  MD, Matia Zelada [phone]) on 03/30/2014 4:41:58 PM      MDM   Final diagnoses:  Headache, unspecified headache type   Acute onset of headache that woke her from sleep this morning. Now resolved. She had "droopy eye"" at the time. Headache resolved now. No neuro deficits now.  CT head negative. ESR 2. No  temporal artery tenderness. Doubt temporal arteritis.  No fever or  meningismus.  Discussed with Dr. Leonel Ramsay neurology. He feels as patient is symptom-free there is no indication to pursue patient's perceived droopy eye. Doubt SAH as headache was "sudden onset" 4 days ago, and she woke up with it again today.  Doubt CVA or TIA. Unlikely to be SAH given recurrent nature. With nausea, photophobia, suspect migraines as source of headaches.  Neurology recommends CTA. Patient has loop recorder making it difficult to get MRI.  CTA without aneurysm or dissection.  Patient already on ASA. She remaines headache and symptom free in the ED. Discussed follow up with neurology this week. Return precautions discussed.  Ezequiel Essex, MD 03/31/14 [phone]

## 2014-03-30 NOTE — ED Notes (Addendum)
C/o sudden onset of headache since 5:30am that woke her up.  States she has taken a total of 5 Aleve today with no relief.  Also reports nausea, diarrhea, and chills.  Denies history of headaches (since stopping menstrual cycle) except for severe headache in 2011.  Pt believes diarrhea is from taking Aleve.  No neuro deficits noted on triage exam.  States headache feels better now than earlier.  Reports earlier she was having pain to R side of face as well and felt like R eye was swollen.  States R eye was drooping when she looked in mirror.

## 2014-03-30 NOTE — Discharge Instructions (Signed)
General Headache Without Cause Followup with her neurologist this week. Take your medications as prescribed including the aspirin.  Return to the ED if you develop new or worsening symptoms. A headache is pain or discomfort felt around the head or neck area. The specific cause of a headache may not be found. There are many causes and types of headaches. A few common ones are:  Tension headaches.  Migraine headaches.  Cluster headaches.  Chronic daily headaches. HOME CARE INSTRUCTIONS   Keep all follow-up appointments with your caregiver or any specialist referral.  Only take over-the-counter or prescription medicines for pain or discomfort as directed by your caregiver.  Lie down in a dark, quiet room when you have a headache.  Keep a headache journal to find out what may trigger your migraine headaches. For example, write down:  What you eat and drink.  How much sleep you get.  Any change to your diet or medicines.  Try massage or other relaxation techniques.  Put ice packs or heat on the head and neck. Use these 3 to 4 times per day for 15 to 20 minutes each time, or as needed.  Limit stress.  Sit up straight, and do not tense your muscles.  Quit smoking if you smoke.  Limit alcohol use.  Decrease the amount of caffeine you drink, or stop drinking caffeine.  Eat and sleep on a regular schedule.  Get 7 to 9 hours of sleep, or as recommended by your caregiver.  Keep lights dim if bright lights bother you and make your headaches worse. SEEK MEDICAL CARE IF:   You have problems with the medicines you were prescribed.  Your medicines are not working.  You have a change from the usual headache.  You have nausea or vomiting. SEEK IMMEDIATE MEDICAL CARE IF:   Your headache becomes severe.  You have a fever.  You have a stiff neck.  You have loss of vision.  You have muscular weakness or loss of muscle control.  You start losing your balance or have trouble  walking.  You feel faint or pass out.  You have severe symptoms that are different from your first symptoms. MAKE SURE YOU:   Understand these instructions.  Will watch your condition.  Will get help right away if you are not doing well or get worse. Document Released: 05/30/2005 Document Revised: 08/22/2011 Document Reviewed: 06/15/2011 [facility] Patient Information 2015 [place], [place]. This information is not intended to replace advice given to you by your health care provider. Make sure you discuss any questions you have with your health care provider.

## 2014-04-01 ENCOUNTER — Other Ambulatory Visit: Payer: Self-pay | Admitting: Pulmonary Disease

## 2014-04-01 MED ORDER — DICYCLOMINE HCL 20 MG PO TABS: 20.0000 mg | ORAL_TABLET | Freq: Four times a day (QID) | ORAL | Status: DC

## 2014-04-03 ENCOUNTER — Ambulatory Visit (INDEPENDENT_AMBULATORY_CARE_PROVIDER_SITE_OTHER): Payer: Medicare HMO | Admitting: Neurology

## 2014-04-03 ENCOUNTER — Encounter: Payer: Self-pay | Admitting: Neurology

## 2014-04-03 VITALS — BP 116/70 | HR 73 | Ht 64.0 in | Wt 154.0 lb

## 2014-04-03 DIAGNOSIS — G43109 Migraine with aura, not intractable, without status migrainosus: Secondary | ICD-10-CM

## 2014-04-03 MED ORDER — DICLOFENAC POTASSIUM(MIGRAINE) 50 MG PO PACK: 50.0000 mg | PACK | ORAL | Status: DC | PRN

## 2014-04-03 MED ORDER — GABAPENTIN 100 MG PO CAPS: 100.0000 mg | ORAL_CAPSULE | Freq: Three times a day (TID) | ORAL | Status: DC
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
## 2014-04-04 ENCOUNTER — Emergency Department (HOSPITAL_COMMUNITY)
Admission: EM | Admit: 2014-04-04 | Discharge: 2014-04-04 | Disposition: A | Discharge status: Home / Self Care | Payer: Medicare HMO | Attending: Emergency Medicine | Admitting: Emergency Medicine

## 2014-04-04 ENCOUNTER — Encounter (HOSPITAL_COMMUNITY): Payer: Self-pay | Admitting: Emergency Medicine

## 2014-04-04 DIAGNOSIS — I10 Essential (primary) hypertension: Secondary | ICD-10-CM | POA: Diagnosis not present

## 2014-04-04 DIAGNOSIS — E119 Type 2 diabetes mellitus without complications: Secondary | ICD-10-CM | POA: Diagnosis not present

## 2014-04-04 DIAGNOSIS — R110 Nausea: Secondary | ICD-10-CM | POA: Insufficient documentation

## 2014-04-04 DIAGNOSIS — Z7982 Long term (current) use of aspirin: Secondary | ICD-10-CM | POA: Insufficient documentation

## 2014-04-04 DIAGNOSIS — M1990 Unspecified osteoarthritis, unspecified site: Secondary | ICD-10-CM | POA: Insufficient documentation

## 2014-04-04 DIAGNOSIS — K589 Irritable bowel syndrome without diarrhea: Secondary | ICD-10-CM | POA: Insufficient documentation

## 2014-04-04 DIAGNOSIS — H53149 Visual discomfort, unspecified: Secondary | ICD-10-CM | POA: Diagnosis not present

## 2014-04-04 DIAGNOSIS — G894 Chronic pain syndrome: Secondary | ICD-10-CM | POA: Diagnosis not present

## 2014-04-04 DIAGNOSIS — Z79899 Other long term (current) drug therapy: Secondary | ICD-10-CM | POA: Insufficient documentation

## 2014-04-04 DIAGNOSIS — Z8589 Personal history of malignant neoplasm of other organs and systems: Secondary | ICD-10-CM | POA: Diagnosis not present

## 2014-04-04 DIAGNOSIS — K219 Gastro-esophageal reflux disease without esophagitis: Secondary | ICD-10-CM | POA: Insufficient documentation

## 2014-04-04 DIAGNOSIS — R519 Headache, unspecified: Secondary | ICD-10-CM

## 2014-04-04 DIAGNOSIS — F419 Anxiety disorder, unspecified: Secondary | ICD-10-CM | POA: Insufficient documentation

## 2014-04-04 DIAGNOSIS — G629 Polyneuropathy, unspecified: Secondary | ICD-10-CM | POA: Diagnosis not present

## 2014-04-04 DIAGNOSIS — Z8709 Personal history of other diseases of the respiratory system: Secondary | ICD-10-CM | POA: Insufficient documentation

## 2014-04-04 DIAGNOSIS — R51 Headache: Secondary | ICD-10-CM | POA: Diagnosis not present

## 2014-04-04 DIAGNOSIS — Z9861 Coronary angioplasty status: Secondary | ICD-10-CM | POA: Insufficient documentation

## 2014-04-04 MED ORDER — DIPHENHYDRAMINE HCL 50 MG/ML IJ SOLN
25.0000 mg | Freq: Once | INTRAMUSCULAR | Status: AC
  Administered 2014-04-04: 25 mg via INTRAVENOUS
  Filled 2014-04-04: qty 1

## 2014-04-04 MED ORDER — PROCHLORPERAZINE EDISYLATE 5 MG/ML IJ SOLN
10.0000 mg | Freq: Once | INTRAMUSCULAR | Status: AC
  Administered 2014-04-04: 10 mg via INTRAVENOUS
  Filled 2014-04-04: qty 2

## 2014-04-04 MED ORDER — ONDANSETRON HCL 4 MG/2ML IJ SOLN
4.0000 mg | Freq: Once | INTRAMUSCULAR | Status: AC
  Administered 2014-04-04: 4 mg via INTRAVENOUS
  Filled 2014-04-04: qty 2

## 2014-04-04 MED ORDER — MORPHINE SULFATE 4 MG/ML IJ SOLN
4.0000 mg | Freq: Once | INTRAMUSCULAR | Status: AC
Start: 2014-04-04 — End: 2014-04-04
  Administered 2014-04-04: 4 mg via INTRAVENOUS
  Filled 2014-04-04: qty 1

## 2014-04-04 MED ORDER — PROCHLORPERAZINE MALEATE 10 MG PO TABS: 10.0000 mg | ORAL_TABLET | Freq: Two times a day (BID) | ORAL | Status: DC | PRN

## 2014-04-04 NOTE — ED Notes (Signed)
Was seen here Sunday for same--saw neuro yesterday for same.

## 2014-04-04 NOTE — ED Notes (Signed)
Pt c/o headache since 0230 today. Pt has taken 6-7 aleve since 0230 today and xanax. Pt c/o Nausea and has light sensitivity.
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
## 2014-04-04 NOTE — Discharge Instructions (Signed)

## 2014-04-04 NOTE — ED Notes (Signed)
Dr. Colin Rhein in with pt.

## 2014-04-08 NOTE — ED Provider Notes (Signed)
Medical screening examination/treatment/procedure(s) were conducted as a shared visit with non-physician practitioner(s) and myself.  I personally evaluated the patient during the encounter.   EKG Interpretation None       Briefly, pt is a 68 y.o. female presenting with recurrent ha, with symptoms today similar.  I performed an examination on the patient including cardiac, pulmonary, and gi systems which were unremarkable.  No focal neuro deficits.  Symptoms improved with therapy in ED.  DC home in stable condition.     Debby Freiberg, MD 04/08/14 [phone]

## 2014-04-09 ENCOUNTER — Emergency Department (HOSPITAL_COMMUNITY)
Admission: EM | Admit: 2014-04-09 | Discharge: 2014-04-09 | Disposition: A | Discharge status: Home / Self Care | Payer: Medicare HMO | Attending: Emergency Medicine | Admitting: Emergency Medicine

## 2014-04-09 ENCOUNTER — Encounter (HOSPITAL_COMMUNITY): Payer: Self-pay | Admitting: Emergency Medicine

## 2014-04-09 ENCOUNTER — Telehealth: Payer: Self-pay | Admitting: Neurology

## 2014-04-09 DIAGNOSIS — Z7982 Long term (current) use of aspirin: Secondary | ICD-10-CM | POA: Insufficient documentation

## 2014-04-09 DIAGNOSIS — Z79899 Other long term (current) drug therapy: Secondary | ICD-10-CM | POA: Insufficient documentation

## 2014-04-09 DIAGNOSIS — E119 Type 2 diabetes mellitus without complications: Secondary | ICD-10-CM | POA: Diagnosis not present

## 2014-04-09 DIAGNOSIS — G43909 Migraine, unspecified, not intractable, without status migrainosus: Secondary | ICD-10-CM | POA: Diagnosis not present

## 2014-04-09 DIAGNOSIS — K029 Dental caries, unspecified: Secondary | ICD-10-CM | POA: Diagnosis not present

## 2014-04-09 DIAGNOSIS — M797 Fibromyalgia: Secondary | ICD-10-CM | POA: Insufficient documentation

## 2014-04-09 DIAGNOSIS — R51 Headache: Secondary | ICD-10-CM | POA: Diagnosis present

## 2014-04-09 DIAGNOSIS — F419 Anxiety disorder, unspecified: Secondary | ICD-10-CM | POA: Diagnosis not present

## 2014-04-09 DIAGNOSIS — K219 Gastro-esophageal reflux disease without esophagitis: Secondary | ICD-10-CM | POA: Diagnosis not present

## 2014-04-09 DIAGNOSIS — G629 Polyneuropathy, unspecified: Secondary | ICD-10-CM | POA: Diagnosis not present

## 2014-04-09 DIAGNOSIS — Z8541 Personal history of malignant neoplasm of cervix uteri: Secondary | ICD-10-CM | POA: Diagnosis not present

## 2014-04-09 DIAGNOSIS — Z8709 Personal history of other diseases of the respiratory system: Secondary | ICD-10-CM | POA: Insufficient documentation

## 2014-04-09 DIAGNOSIS — I10 Essential (primary) hypertension: Secondary | ICD-10-CM | POA: Diagnosis not present

## 2014-04-09 DIAGNOSIS — I471 Supraventricular tachycardia: Secondary | ICD-10-CM | POA: Insufficient documentation

## 2014-04-09 DIAGNOSIS — M810 Age-related osteoporosis without current pathological fracture: Secondary | ICD-10-CM | POA: Diagnosis not present

## 2014-04-09 DIAGNOSIS — G894 Chronic pain syndrome: Secondary | ICD-10-CM | POA: Diagnosis not present

## 2014-04-09 DIAGNOSIS — M179 Osteoarthritis of knee, unspecified: Secondary | ICD-10-CM | POA: Insufficient documentation

## 2014-04-09 DIAGNOSIS — Z9861 Coronary angioplasty status: Secondary | ICD-10-CM | POA: Diagnosis not present

## 2014-04-09 MED ORDER — SODIUM CHLORIDE 0.9 % IV BOLUS (SEPSIS)
500.0000 mL | Freq: Once | INTRAVENOUS | Status: AC
  Administered 2014-04-09: 500 mL via INTRAVENOUS

## 2014-04-09 MED ORDER — DIPHENHYDRAMINE HCL 50 MG/ML IJ SOLN
25.0000 mg | Freq: Once | INTRAMUSCULAR | Status: AC
  Administered 2014-04-09: 25 mg via INTRAVENOUS
  Filled 2014-04-09: qty 1

## 2014-04-09 MED ORDER — DICLOFENAC POTASSIUM(MIGRAINE) 50 MG PO PACK: 50.0000 mg | PACK | ORAL | Status: DC | PRN

## 2014-04-09 MED ORDER — PROCHLORPERAZINE EDISYLATE 5 MG/ML IJ SOLN
10.0000 mg | Freq: Once | INTRAMUSCULAR | Status: AC
  Administered 2014-04-09: 10 mg via INTRAVENOUS
  Filled 2014-04-09: qty 2

## 2014-04-09 MED ORDER — GABAPENTIN 100 MG PO CAPS: 100.0000 mg | ORAL_CAPSULE | Freq: Three times a day (TID) | ORAL | Status: DC

## 2014-04-09 NOTE — ED Notes (Signed)
Renee [name], daughter in law is leaving [phone] She wanted it known that patient did not fill prescription from last time she was here and also per daughter in law, daughter is coming.

## 2014-04-09 NOTE — Telephone Encounter (Signed)
Patient's daughter Langley Gauss calling to state that patient was originally given 2 scripts some time ago for diclofen and gabapentin but never took them to her pharmacy because she thought it was going to cost too much. Patient is in the hospital now for the same issue and wants to get these prescriptions. Please return call to [facility] and advise.

## 2014-04-09 NOTE — ED Provider Notes (Signed)
CSN: [id number]     Arrival date & time 04/09/14  0488 History   First MD Initiated Contact with Patient 04/09/14 [phone]     Chief Complaint  Patient presents with  . Headache     (Consider location/radiation/quality/duration/timing/severity/associated sxs/prior Treatment) HPI 68 year old female presents with a recurrent headache. This third time she's had a headache in the last 2 weeks. Before this she hadn't had migraines like this in several years. She states this headache is not different than the other headaches just keep coming back. It started as feeling "hyper" last night, then this AM around 4 AM she had a severe headache. She saw a neurologist 6 days ago and is currently just being treated symptomatically. [name] not had any neck stiffness, fevers, or focal neuro deficits. She feels a right-sided throbbing headache and feeling it radiates to her teeth. She has not noticed any increased swelling in her mouth or pain with eating. Patient is currently nauseous. Patient is having photo and phonophobia. She was prescribed diclofenac and neurontin by neurologist but states the pharmacy told her it would be $500 so she did not fill it.  Past Medical History  Diagnosis Date  . Bronchitis     Recurrent  . Hypertension   . Chronic chest pain     a. 2005 Cath: Mild nonobstructive plaque (30-40% LCX);  b. 08/2011 low risk myoview;  c. 09/2011 Coronary CT: NL cors w ca score of 0.  . AV nodal re-entry tachycardia     a. s/p RFA 1993- Dr. Rolland Porter, [place]  . Long QT interval     a. mild - advised to avoid meds that may prolong QT.  Marland Kitchen GERD (gastroesophageal reflux disease)   . Dysphagia   . Duodenitis without hemorrhage   . Diverticulosis of colon   . Irritable bowel syndrome   . Constipation, chronic   . Hemorrhoids   . Fecal incontinence   . Thyroid nodule   . Degenerative joint disease     a. s/p L Total Knee Arthroplasty.  . Fibromyalgia   . Chronic pain syndrome   . Neuropathy   .  Anxiety   . Pre-syncope   . Cancer     hx of cervical   ca  . SVT (supraventricular tachycardia)     a. nonsustained SVT - previously offered flecainide but refused;  b. 12/2012 30 day event monitor w/o significant arrhythmias.  . Osteoporosis   . Diet-controlled type 2 diabetes mellitus    Past Surgical History  Procedure Laterality Date  . Biopsy thyroid    . Total hip arthroplasty  10/10    Left, at [facility]  . Hysteroscopy  2002    with resection of endometrial polyps. by Dr.MCPhail  . Coronary angioplasty  2007  . Cardiac electrophysiology study and ablation      atrioventricular nodal reentant tachycardia  . Ablation of dysrhythmic focus  1993  . Cholecystectomy  1980  . Orif radial head / neck fracture  09/05/88  . Colonoscopy  06/25/2008    Diverticulosis and Hemorrhoids  . Esophagogastroduodenoscopy    . Loop recorder implant  08/21/2013    MDT LinQ implanted by Dr Lovena Le for syncope   Family History  Problem Relation Age of Onset  . Ovarian cancer Mother   . Stroke Mother   . Diabetes Mother   . Asthma Mother   . Diabetes Sister     Pacemaker, CHF  . Heart disease Sister   . Asthma Sister   .  Lung cancer Father     Heart problems  . Heart disease Sister   . Diabetes Brother    History  Substance Use Topics  . Smoking status: Never Smoker   . Smokeless tobacco: Never Used  . Alcohol Use: Yes     Comment: Occasional    OB History   Grav Para Term Preterm Abortions TAB SAB Ect Mult Living   2 2        2      Review of Systems  Constitutional: Negative for fever.  HENT: Positive for dental problem.   Gastrointestinal: Positive for nausea and vomiting.  Neurological: Positive for headaches. Negative for weakness, light-headedness and numbness.  All other systems reviewed and are negative.     Allergies  Cyclobenzaprine; Adhesive; Moxifloxacin; and Quinolones  Home Medications   Prior to Admission medications   Medication Sig Start Date  End Date Taking? Authorizing Provider  ALPRAZolam Duanne Moron) 0.5 MG tablet Take 0.25-0.5 mg by mouth 2 (two) times daily as needed for anxiety.    Historical Provider, MD  aspirin EC 81 MG tablet Take 81 mg by mouth every morning.     Historical Provider, MD  Cholecalciferol (VITAMIN D3) 2000 UNITS TABS Take 4,000 Units by mouth daily.    Historical Provider, MD  Diclofenac Potassium 50 MG PACK Take 50 mg by mouth as needed. 04/03/14   Marcial Pacas, MD  dicyclomine (BENTYL) 20 MG tablet Take 1 tablet (20 mg total) by mouth every 6 (six) hours. 04/01/14   Noralee Space, MD  diltiazem (CARDIZEM CD) 120 MG 24 hr capsule Take 120 mg by mouth daily.    Historical Provider, MD  flecainide (TAMBOCOR) 50 MG tablet Take 25 mg by mouth 2 (two) times daily.  04/01/13   Evans Lance, MD  gabapentin (NEURONTIN) 100 MG capsule Take 1 capsule (100 mg total) by mouth 3 (three) times daily. 04/03/14   Marcial Pacas, MD  naproxen sodium (ALEVE) 220 MG tablet Take 440-660 mg by mouth every 4 (four) hours as needed (for pain).     Historical Provider, MD  nitroGLYCERIN (NITROSTAT) 0.4 MG SL tablet Place 0.4 mg under the tongue every 5 (five) minutes as needed for chest pain. For chest pain    Historical Provider, MD  pantoprazole (PROTONIX) 40 MG tablet Take 80 mg by mouth daily.    Historical Provider, MD  PARoxetine Mesylate (BRISDELLE) 7.5 MG CAPS Take 1 capsule by mouth at bedtime.    Historical Provider, MD  potassium chloride (MICRO-K) 10 MEQ CR capsule Take 1 capsule (10 mEq total) by mouth daily. 09/17/13   Larey Dresser, MD  prochlorperazine (COMPAZINE) 10 MG tablet Take 1 tablet (10 mg total) by mouth 2 (two) times daily as needed for nausea or vomiting (Nausea ). 04/04/14   Elwyn Lade, PA-C  triamterene-hydrochlorothiazide (DYAZIDE) 37.5-25 MG per capsule Take 1 capsule by mouth daily.    Historical Provider, MD  zolpidem (AMBIEN) 10 MG tablet Take 10 mg by mouth at bedtime as needed for sleep.    Historical  Provider, MD   BP 126/57  Temp(Src) 97.8 F (36.6 C) (Oral)  Resp 12  Ht 5\' 3"  (1.6 m)  Wt 155 lb (70.308 kg)  BMI 27.46 kg/m2  SpO2 98% Physical Exam  Nursing note and vitals reviewed. Constitutional: She is oriented to person, place, and time. She appears well-developed and well-nourished.  HENT:  Head: Normocephalic and atraumatic.  Right Ear: External ear normal.  Left Ear:  External ear normal.  Nose: Nose normal.  Multiple dental caries. No gingival swelling, abscess or tenderness to teeth or gums  Eyes: EOM are normal. Pupils are equal, round, and reactive to light. Right eye exhibits no discharge. Left eye exhibits no discharge.  Neck:  Normal passive ROM without pain or stiffness  Cardiovascular: Normal rate, regular rhythm and normal heart sounds.   Pulmonary/Chest: Effort normal and breath sounds normal.  Abdominal: Soft. There is no tenderness.  Neurological: She is alert and oriented to person, place, and time.  CN 2-12 grossly intact. 5/5 strength in all 4 extremities  Skin: Skin is warm and dry.    ED Course  Procedures (including critical care time) Labs Review Labs Reviewed - No data to display  Imaging Review No results found.   EKG Interpretation None      MDM   Final diagnoses:  None    Patient appears to have an uncomplicated migraine. Patient has had a recent CT head and CT angiogram of head and neck were negative. I do not feel she is repeat imaging. I have low suspicion for subarachnoid hemorrhage. The patient's pain improved significantly with Compazine and fluids. Her family looked up the medicine she was unable to afford in [place] states they are only $20 combined. Due to this they will pick these up after discharge. Given the patient's pain is improved I feel she is stable for discharge with outpatient management of her headaches.    Ephraim Hamburger, MD 04/09/14 [phone]

## 2014-04-09 NOTE — ED Notes (Signed)
Dr. Goldston at the bedside. 

## 2014-04-09 NOTE — Discharge Instructions (Signed)

## 2014-04-09 NOTE — ED Notes (Signed)
Pt reports being seen here multiple times for severe headache and nausea. Reports unable to fill her prescriptions that she received on her last visit.

## 2014-04-09 NOTE — Telephone Encounter (Signed)
Prescriptions were written at [facility] this month.  Rx's have been resent to the pharmacy.  I called the patient back.  Got no answer.  Left message.

## 2014-04-10 ENCOUNTER — Other Ambulatory Visit: Payer: Self-pay | Admitting: Internal Medicine

## 2014-04-14 ENCOUNTER — Encounter (HOSPITAL_COMMUNITY): Payer: Self-pay | Admitting: Emergency Medicine

## 2014-04-14 ENCOUNTER — Telehealth: Payer: Self-pay | Admitting: Neurology

## 2014-04-14 NOTE — Telephone Encounter (Signed)
I called back.  Spoke with Angelica.  She was not able to assist me and transferred me to Jan (female).  He placed me on hold for several minutes, then said he would have to transfer me to someone else for further assistance.  I then spoke with [facility].  She said they will review the request and notify the patient of outcome.

## 2014-04-14 NOTE — Telephone Encounter (Signed)
Ben from [employer] regarding this same message, please return his call at [phone].

## 2014-04-14 NOTE — Telephone Encounter (Signed)
Ada with [employer] is calling regarding a diagnosis for the medication Cambia 50mg  packet. Please call.

## 2014-04-16 ENCOUNTER — Telehealth: Payer: Self-pay | Admitting: Pulmonary Disease

## 2014-04-16 ENCOUNTER — Telehealth: Payer: Self-pay | Admitting: Cardiology

## 2014-04-16 ENCOUNTER — Telehealth: Payer: Self-pay | Admitting: Neurology

## 2014-04-16 DIAGNOSIS — E041 Nontoxic single thyroid nodule: Secondary | ICD-10-CM

## 2014-04-16 DIAGNOSIS — K5730 Diverticulosis of large intestine without perforation or abscess without bleeding: Secondary | ICD-10-CM

## 2014-04-16 NOTE — Telephone Encounter (Signed)
Spoke with pt. She reports she has lost 20-25 lbs in the past 6-8 weeks. Pt has called neurology/cardiology as well Pt reports she has had a recent migraines and then the hot flashes have stopped. Pt reports she has had hot flashes for years. Pt is concerned. Please advise SN thanks

## 2014-04-16 NOTE — Telephone Encounter (Signed)
Per SN---   Will need follow up of thyroid---come to the lab for tsh, free t3, free t4 Her last cxr was ok Last labs were ok  Suggest that she be seen by GI eval (any doctor) for weight loss eval.

## 2014-04-16 NOTE — Telephone Encounter (Signed)
Spoke with [employer].  She asked that I forward message to her and she will follow-up with patient.

## 2014-04-16 NOTE — Telephone Encounter (Signed)
Pt states she has been hospitalized 10/18,10/23, 10/28 for migraine headaches.  Pt states she received a call 04/12/14 from [place] and was told her heart had stopped for 3 seconds on 04/09/14. Pt states she was told Dr Lovena Le would get a copy of this report and would be back in touch with her.

## 2014-04-16 NOTE — Telephone Encounter (Signed)
LMOM--[id number]/kwm

## 2014-04-16 NOTE — Telephone Encounter (Signed)
Patient calling back questioning how much of the Diclofenac Potassium (CAMBIA) 50 MG PACK she can take per day.  She has already taken 2 tablets early in the am.  Patient has headache and feel may turn into another Migraine.  Please call and advise.

## 2014-04-16 NOTE — Telephone Encounter (Signed)
lmomtcb x1 

## 2014-04-16 NOTE — Telephone Encounter (Signed)
Patient calling to state that she has had major headaches where she had to go to the hospital, states that she has lost 20 lbs in 6 weeks, patient is very concerned, please call and advise.

## 2014-04-16 NOTE — Telephone Encounter (Signed)
New message  Pt called states that she is now having a lot of hot flashes... Reading show that her heart stopped... She has lost 20 lbs in 6 weeks. She has called all Dr.'s and she is requesting a call back to discuss having a pacer put in. Please call

## 2014-04-17 MED ORDER — GABAPENTIN 300 MG PO CAPS: 300.0000 mg | ORAL_CAPSULE | Freq: Three times a day (TID) | ORAL | Status: DC

## 2014-04-17 NOTE — Telephone Encounter (Signed)
[employer] notified us they have approved coverage for Cambia #9 packets per 30 days effective until 06/13/2015.  Ref Member Number Alexis

## 2014-04-17 NOTE — Telephone Encounter (Signed)
She thinks Neurontin 100mg  tid was helpful, I will increase it to 300mg  tid.  Diclofenac tab did not help,   Hydrocodone as needed, she has extensive cardiac issues, not a good candidate for triptan, sensitive to coffee

## 2014-04-17 NOTE — Telephone Encounter (Signed)
Called and spoke with pt and she is aware of labs in the computer and she will come in for these.  Order placed to get the pt in to see GI for further eval of her weight loss.  Pt is aware and nothing further is needed.

## 2014-04-22 ENCOUNTER — Ambulatory Visit (INDEPENDENT_AMBULATORY_CARE_PROVIDER_SITE_OTHER): Payer: Medicare HMO | Admitting: *Deleted

## 2014-04-22 DIAGNOSIS — R55 Syncope and collapse: Secondary | ICD-10-CM

## 2014-04-29 ENCOUNTER — Encounter: Payer: Self-pay | Admitting: Gastroenterology

## 2014-04-29 ENCOUNTER — Ambulatory Visit: Payer: Medicare HMO | Admitting: Gastroenterology

## 2014-04-29 NOTE — Progress Notes (Signed)
The patient's chart has been reviewed by Dr. Fuller Plan  and the recommendations are noted below.  Follow-up advised. Contact patient and schedule visit for next available appointment.  Outcome of communication with the patient:  I have spoken to patient. She originally states that she does not know who Dr Fuller Plan is. I explained that he was in the same office as Dr Sharlett Iles. She states "well who scheduled me cause I didn't schedule an appointment with no new doctor." I explained that our records show the appointment was scheduled with her. She later tells me that she had appointment information on her calender but forgot about it.  Per Dr Fuller Plan, patient should be charged no show fee.

## 2014-04-30 NOTE — Progress Notes (Signed)
Loop recorder 

## 2014-05-02 LAB — MDC_IDC_ENUM_SESS_TYPE_REMOTE: Date Time Interrogation Session: 20151113050500

## 2014-05-05 ENCOUNTER — Ambulatory Visit (INDEPENDENT_AMBULATORY_CARE_PROVIDER_SITE_OTHER): Payer: Medicare HMO | Admitting: *Deleted

## 2014-05-05 DIAGNOSIS — R55 Syncope and collapse: Secondary | ICD-10-CM

## 2014-05-05 LAB — MDC_IDC_ENUM_SESS_TYPE_INCLINIC

## 2014-05-05 NOTE — Progress Notes (Signed)
Loop check in clinic.  Pt with 3 tachy episodes; 0 brady episodes; 1 pause.  Episodes were appropriate/undersensing/oversensing.  Plan to enroll in remote follow up and see in clinic annually.  All episodes were previously noted.  ROV in March with Dr. Lovena Le.

## 2014-05-07 ENCOUNTER — Telehealth: Payer: Self-pay | Admitting: Adult Health

## 2014-05-07 ENCOUNTER — Ambulatory Visit: Payer: Medicare HMO | Admitting: Adult Health

## 2014-05-07 NOTE — Telephone Encounter (Signed)
Patient no showed for a revisit appointment.  

## 2014-05-12 ENCOUNTER — Encounter: Payer: Self-pay | Admitting: *Deleted

## 2014-05-15 ENCOUNTER — Encounter: Payer: Self-pay | Admitting: Internal Medicine

## 2014-05-20 ENCOUNTER — Encounter: Payer: Self-pay | Admitting: Internal Medicine

## 2014-05-21 ENCOUNTER — Encounter: Payer: Self-pay | Admitting: Internal Medicine

## 2014-05-21 ENCOUNTER — Ambulatory Visit (INDEPENDENT_AMBULATORY_CARE_PROVIDER_SITE_OTHER): Payer: Medicare HMO | Admitting: *Deleted

## 2014-05-21 DIAGNOSIS — R55 Syncope and collapse: Secondary | ICD-10-CM

## 2014-05-22 ENCOUNTER — Encounter (HOSPITAL_COMMUNITY): Payer: Self-pay | Admitting: Internal Medicine

## 2014-05-22 ENCOUNTER — Encounter: Payer: Self-pay | Admitting: Internal Medicine

## 2014-05-23 NOTE — Progress Notes (Signed)
Loop recorder 

## 2014-05-28 ENCOUNTER — Ambulatory Visit: Payer: Medicare HMO | Admitting: Gastroenterology

## 2014-05-30 ENCOUNTER — Ambulatory Visit: Payer: Medicare HMO | Admitting: Cardiology

## 2014-05-30 ENCOUNTER — Ambulatory Visit: Payer: Medicare HMO

## 2014-06-02 ENCOUNTER — Telehealth: Payer: Self-pay | Admitting: Internal Medicine

## 2014-06-02 NOTE — Telephone Encounter (Signed)
Reviewed tracing from 12/19 and left message for patient that no ectopy was noted.

## 2014-06-02 NOTE — Telephone Encounter (Signed)
New message      Pt is returning Paula's call.  Please call

## 2014-06-02 NOTE — Telephone Encounter (Signed)
New problem   Pt had incident 05/31/14 with her loop recorder and put the base up to her heart and need to speak to someone it. She felt dizziness/lose balance/couldn't concentrate while she was transmitting.

## 2014-06-03 NOTE — Telephone Encounter (Signed)
Spoke with patient, states she had an episode 05/31/14 of near syncope followed by severe dizziness, weakness and SOB.  Carelink transmission shows NSR.  Encouraged patient to continue to send transmissions for episodes and I will have Dr. Lovena Le review these when he is in the office 06/10/14.  I did mention possibly going to the ED if her symptoms continue but she refused.

## 2014-06-10 ENCOUNTER — Encounter: Payer: Self-pay | Admitting: Internal Medicine

## 2014-06-11 ENCOUNTER — Ambulatory Visit (INDEPENDENT_AMBULATORY_CARE_PROVIDER_SITE_OTHER): Payer: Medicare HMO

## 2014-06-11 DIAGNOSIS — Z23 Encounter for immunization: Secondary | ICD-10-CM

## 2014-06-11 LAB — MDC_IDC_ENUM_SESS_TYPE_REMOTE: Date Time Interrogation Session: 20151211050500

## 2014-06-20 ENCOUNTER — Ambulatory Visit (INDEPENDENT_AMBULATORY_CARE_PROVIDER_SITE_OTHER): Payer: Medicare HMO | Admitting: *Deleted

## 2014-06-20 DIAGNOSIS — R55 Syncope and collapse: Secondary | ICD-10-CM

## 2014-06-24 ENCOUNTER — Encounter: Payer: Self-pay | Admitting: Internal Medicine

## 2014-06-25 ENCOUNTER — Encounter: Payer: Self-pay | Admitting: Internal Medicine

## 2014-06-27 NOTE — Progress Notes (Signed)
Loop recorder 

## 2014-07-03 ENCOUNTER — Other Ambulatory Visit: Payer: Self-pay | Admitting: Obstetrics and Gynecology

## 2014-07-07 LAB — CYTOLOGY - PAP

## 2014-07-09 ENCOUNTER — Other Ambulatory Visit: Payer: Self-pay

## 2014-07-09 MED ORDER — FLECAINIDE ACETATE 50 MG PO TABS: 25.0000 mg | ORAL_TABLET | Freq: Two times a day (BID) | ORAL | Status: DC

## 2014-07-10 ENCOUNTER — Other Ambulatory Visit: Payer: Self-pay

## 2014-07-10 MED ORDER — TRIAMTERENE-HCTZ 37.5-25 MG PO CAPS: 1.0000 | ORAL_CAPSULE | Freq: Every day | ORAL | Status: DC

## 2014-07-15 ENCOUNTER — Encounter: Payer: Self-pay | Admitting: Internal Medicine

## 2014-07-15 LAB — MDC_IDC_ENUM_SESS_TYPE_REMOTE: Date Time Interrogation Session: 20160120050500

## 2014-07-17 ENCOUNTER — Encounter: Payer: Self-pay | Admitting: Internal Medicine

## 2014-07-21 ENCOUNTER — Ambulatory Visit (INDEPENDENT_AMBULATORY_CARE_PROVIDER_SITE_OTHER): Payer: Self-pay | Admitting: *Deleted

## 2014-07-21 DIAGNOSIS — R55 Syncope and collapse: Secondary | ICD-10-CM

## 2014-07-21 LAB — MDC_IDC_ENUM_SESS_TYPE_REMOTE: Date Time Interrogation Session: 20160206050500

## 2014-07-23 NOTE — Progress Notes (Signed)
Loop recorder 

## 2014-07-30 ENCOUNTER — Encounter: Payer: Self-pay | Admitting: Internal Medicine

## 2014-07-30 ENCOUNTER — Telehealth: Payer: Self-pay | Admitting: Cardiology

## 2014-07-30 MED ORDER — DILTIAZEM HCL ER COATED BEADS 120 MG PO CP24: 120.0000 mg | ORAL_CAPSULE | Freq: Every day | ORAL | Status: DC

## 2014-07-30 MED ORDER — DILTIAZEM HCL ER COATED BEADS 120 MG PO CP24
120.0000 mg | ORAL_CAPSULE | Freq: Every day | ORAL | Status: DC
Start: 2014-07-30 — End: 2014-08-04

## 2014-07-30 NOTE — Telephone Encounter (Signed)
RX sent into pharmacy

## 2014-07-30 NOTE — Telephone Encounter (Signed)
New Msg       Pt c/o medication issue:  1. Name of Medication: Cartia XT 120/24 capsule  2. How are you currently taking this medication (dosage and times per day)?     3. Are you having a reaction (difficulty breathing--STAT)? No  4. What is your medication issue? Pt states she is out of medication and needs a short supply until she can get a refill.   Pt states [facility] in [place] is faxing an urgent request because she needs emergency supply until she can get for free in the mail.   Please return pt call.

## 2014-07-30 NOTE — Telephone Encounter (Signed)
Please give this to her 

## 2014-07-30 NOTE — Telephone Encounter (Signed)
Patient is having a 10 day gap of medication and waiting for prescription order in the mail which could take over a week. Patient is requesting Cartia XT 120 mg daily. Will send to Dr. Aundra Dubin for order.

## 2014-07-30 NOTE — Telephone Encounter (Signed)
Called patient, sent prescription to patient's pharmacy of choice. Patient verbalized understanding.

## 2014-07-31 ENCOUNTER — Other Ambulatory Visit: Payer: Self-pay | Admitting: Pulmonary Disease

## 2014-07-31 MED ORDER — ACCU-CHEK NANO SMARTVIEW W/DEVICE KIT: PACK | Status: DC

## 2014-07-31 MED ORDER — GLUCOSE BLOOD VI STRP: ORAL_STRIP | Status: DC

## 2014-08-04 ENCOUNTER — Other Ambulatory Visit: Payer: Self-pay

## 2014-08-04 MED ORDER — DILTIAZEM HCL ER COATED BEADS 120 MG PO CP24: 120.0000 mg | ORAL_CAPSULE | Freq: Every day | ORAL | Status: DC

## 2014-08-07 ENCOUNTER — Other Ambulatory Visit: Payer: Self-pay | Admitting: Obstetrics and Gynecology

## 2014-08-18 ENCOUNTER — Encounter: Payer: Self-pay | Admitting: Internal Medicine

## 2014-08-19 ENCOUNTER — Encounter: Payer: Medicare HMO | Admitting: Internal Medicine

## 2014-08-20 ENCOUNTER — Ambulatory Visit (INDEPENDENT_AMBULATORY_CARE_PROVIDER_SITE_OTHER): Payer: Self-pay | Admitting: *Deleted

## 2014-08-20 DIAGNOSIS — R55 Syncope and collapse: Secondary | ICD-10-CM

## 2014-08-21 NOTE — Progress Notes (Signed)
Loop recorder 

## 2014-09-03 LAB — MDC_IDC_ENUM_SESS_TYPE_REMOTE

## 2014-09-17 ENCOUNTER — Ambulatory Visit (INDEPENDENT_AMBULATORY_CARE_PROVIDER_SITE_OTHER): Payer: Medicare PPO | Admitting: Internal Medicine

## 2014-09-17 ENCOUNTER — Encounter: Payer: Self-pay | Admitting: Internal Medicine

## 2014-09-17 VITALS — BP 138/70 | HR 80 | Ht 63.0 in | Wt 158.6 lb

## 2014-09-17 DIAGNOSIS — R55 Syncope and collapse: Secondary | ICD-10-CM

## 2014-09-17 DIAGNOSIS — Z4509 Encounter for adjustment and management of other cardiac device: Secondary | ICD-10-CM | POA: Diagnosis not present

## 2014-09-17 DIAGNOSIS — I10 Essential (primary) hypertension: Secondary | ICD-10-CM

## 2014-09-17 LAB — MDC_IDC_ENUM_SESS_TYPE_INCLINIC

## 2014-09-17 NOTE — Patient Instructions (Signed)
Your physician recommends that you continue on your current medications as directed. Please refer to the Current Medication list given to you today.  Remote monitoring is used to monitor your Pacemaker of ICD from home. This monitoring reduces the number of office visits required to check your device to one time per year. It allows us to keep an eye on the functioning of your device to ensure it is working properly. You are scheduled for a device check from home on 12/17/14. You may send your transmission at any time that day. If you have a wireless device, the transmission will be sent automatically. After your physician reviews your transmission, you will receive a postcard with your next transmission date.  Your physician wants you to follow-up in: 1 year with Dr. Taylor. You will receive a reminder letter in the mail two months in advance. If you don't receive a letter, please call our office to schedule the follow-up appointment.  
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
## 2014-09-17 NOTE — Assessment & Plan Note (Signed)
Her blood pressure is slightly elevated today. She will continue her current medical therapy.

## 2014-09-17 NOTE — Assessment & Plan Note (Signed)
She has had no syncopal episodes. She will undergo watchful waiting. Her loop recorder demonstrates no bradycardia or symptomatic tachycardia.

## 2014-09-17 NOTE — Assessment & Plan Note (Signed)
She has had no recurrent symptomatic SVT. She will continue her current medications.

## 2014-09-18 ENCOUNTER — Telehealth: Payer: Self-pay | Admitting: Pulmonary Disease

## 2014-09-18 ENCOUNTER — Other Ambulatory Visit: Payer: Self-pay | Admitting: Internal Medicine

## 2014-09-18 MED ORDER — GLUCOSE BLOOD VI STRP: ORAL_STRIP | Status: DC

## 2014-09-18 NOTE — Telephone Encounter (Signed)
Pt needs refills on her testing strips. This has been sent in. Nothing further was needed.

## 2014-09-19 ENCOUNTER — Ambulatory Visit (INDEPENDENT_AMBULATORY_CARE_PROVIDER_SITE_OTHER): Payer: Medicare PPO | Admitting: *Deleted

## 2014-09-19 DIAGNOSIS — R55 Syncope and collapse: Secondary | ICD-10-CM

## 2014-09-23 NOTE — Progress Notes (Signed)
Loop recorder 

## 2014-09-26 ENCOUNTER — Encounter: Payer: Self-pay | Admitting: Internal Medicine

## 2014-10-07 ENCOUNTER — Other Ambulatory Visit: Payer: Self-pay | Admitting: Internal Medicine

## 2014-10-10 ENCOUNTER — Encounter: Payer: Self-pay | Admitting: Internal Medicine

## 2014-10-20 ENCOUNTER — Encounter: Payer: Self-pay | Admitting: Internal Medicine

## 2014-10-20 ENCOUNTER — Ambulatory Visit (INDEPENDENT_AMBULATORY_CARE_PROVIDER_SITE_OTHER): Payer: Medicare PPO | Admitting: *Deleted

## 2014-10-20 DIAGNOSIS — R55 Syncope and collapse: Secondary | ICD-10-CM | POA: Diagnosis not present

## 2014-10-21 ENCOUNTER — Telehealth: Payer: Self-pay | Admitting: Nurse Practitioner

## 2014-10-21 LAB — CUP PACEART REMOTE DEVICE CHECK
MDC IDC SESS DTM: 20160410182154
Zone Setting Detection Interval: 2000 ms
Zone Setting Detection Interval: 3000 ms
Zone Setting Detection Interval: 370 ms

## 2014-10-21 NOTE — Telephone Encounter (Signed)
   Pt called to report that she's having intermittent weak spells over the past several years - described as sudden in onset and associated with diaphoresis and unsteady gait, "like I'm being pulled to the left."  She has an ILR and this was last interrogated on 4/6, and did not show any brady or tachyarrhythmias.  Today, she had another episode of weakness.  She tried to send a submission via her loop recorder but doesn't think the box at her bedside is working.  She is currently stable.  I advised that I would let our EP team know and they may want to have her come in for interrogation tomorrow.  Caller verbalized understanding and was grateful for the call back.  Murray Hodgkins, NP 10/21/2014, 5:25 PM

## 2014-10-22 ENCOUNTER — Telehealth: Payer: Self-pay | Admitting: *Deleted

## 2014-10-22 ENCOUNTER — Telehealth: Payer: Self-pay | Admitting: Pulmonary Disease

## 2014-10-22 DIAGNOSIS — M2550 Pain in unspecified joint: Secondary | ICD-10-CM

## 2014-10-22 NOTE — Telephone Encounter (Signed)
Spoke w/pt in regards to episodes. Episodes last 3 to 4 hours with pt feeling SOB/unsteadiness/lifeless. While episodes occur, pt has to just lay down and can not do anything. Pt has history of Long QT and ablation in 1993. Pt wants answers to what is going. Pt's blood pressure with last spell 170/99. Episodes come on fast and leave quick. Sending to Dr Lovena Le and Dr Aundra Dubin per pt request.

## 2014-10-22 NOTE — Telephone Encounter (Signed)
LMTCB

## 2014-10-22 NOTE — Telephone Encounter (Signed)
Renee [name], probably get her on to see me on the afternoon of 5/31 if there is nothing on her loop recorder.  If there is an arrhythmia on loop recorder, needs to see Dr Lovena Le.

## 2014-10-22 NOTE — Telephone Encounter (Signed)
Pt recorded symptom episode on 4-29 showing NSR. Tachy episode on 4-27 which was printed out for review. No other episodes recorded.

## 2014-10-22 NOTE — Telephone Encounter (Signed)
Spoke with pt, is requesting a referral picked by SN for a pcp.  She is upset that SN is retiring from [facility], dispensing and is requesting he choose a specific provider that will be compatible with her.  Also needs a referral to Gavin Pound- states this is her RA specialist.  Because of her new insurance she is needing a referral to her from her PCP. Patient is requesting that these matters be handled asap.   Dr. Lenna Gilford please advise on a specific PCP, and if you're ok with the referral to Dr. Trudie Reed.  Thanks!

## 2014-10-22 NOTE — Telephone Encounter (Signed)
There was nothing on her ILR?

## 2014-10-23 NOTE — Telephone Encounter (Signed)
Rachel - has this been done?  Please advise. 

## 2014-10-24 ENCOUNTER — Telehealth: Payer: Self-pay | Admitting: *Deleted

## 2014-10-24 NOTE — Telephone Encounter (Signed)
[phone], pt cb

## 2014-10-24 NOTE — Telephone Encounter (Signed)
Error

## 2014-10-24 NOTE — Telephone Encounter (Signed)
Per SN, he recommends either Dr. Deborra Medina or Dr Glori Bickers with [facility] at [facility] location. Spoke with pt earlier and she stated that she did not want to see PCP with [facility] and that she preferred to stay within the [facility] group. Tried to call pt back with this information. No answer. Left a voicemail for her to call back to discuss this.

## 2014-10-24 NOTE — Telephone Encounter (Signed)
Spoke with pt again and PCP referral. Pt stated that she did not want a pcp near her home and that she preferred to come to [facility]. Per SN, the name Dr Jenny Reichmann was given with [facility]. Pt thanked Probation officer for info and stated she was going to set up an appt.

## 2014-10-24 NOTE — Telephone Encounter (Signed)
Pt advised she has been scheduled to see Dr Aundra Dubin 11/11/14 at 3:30PM.

## 2014-10-24 NOTE — Telephone Encounter (Signed)
Tried to call pt to talk about referral. Had to leave a voicemail. Per SN, he is ok with putting a referral for pt to see Dr Trudie Reed. His recommendations for a good PCP are either Dr Shelia Media or Dr Alyson Ingles that are within the same practice as Dr Trudie Reed.   When pt calls back, please connect her with SN nurse so that i can speak to her directly.

## 2014-10-24 NOTE — Telephone Encounter (Signed)
[phone], pt cb

## 2014-10-24 NOTE — Telephone Encounter (Signed)
Spoke with patient today, she is upset because she has to talk to so many different people instead of Dr. Jeannine Kitten nurse.  She said that she would like to know today what provider Dr. Lenna Gilford recommends for her as a new PCP and she said that she really needs a referral from Dr. Lenna Gilford for Dr. Trudie Reed as well because she has been seeing Dr. Trudie Reed for a while and Dr. Trudie Reed moved to [facility].  Since she changed practices, her insurance will not pay for her visits unless she gets a referral from her PCP.   To Renee [name] -- please expedite this request per patient's request.

## 2014-10-24 NOTE — Progress Notes (Signed)
Loop recorder 

## 2014-10-30 ENCOUNTER — Encounter: Payer: Self-pay | Admitting: Internal Medicine

## 2014-10-31 ENCOUNTER — Encounter: Payer: Self-pay | Admitting: Internal Medicine

## 2014-11-06 ENCOUNTER — Other Ambulatory Visit: Payer: Self-pay

## 2014-11-06 MED ORDER — FLECAINIDE ACETATE 50 MG PO TABS: 3.0000 mg | ORAL_TABLET | Freq: Two times a day (BID) | ORAL | Status: DC

## 2014-11-06 NOTE — Telephone Encounter (Signed)
Per note 4.6.16

## 2014-11-10 LAB — CUP PACEART REMOTE DEVICE CHECK
MDC IDC SESS DTM: 20160512154115
MDC IDC SET ZONE DETECTION INTERVAL: 3000 ms
Zone Setting Detection Interval: 2000 ms
Zone Setting Detection Interval: 370 ms

## 2014-11-11 ENCOUNTER — Encounter: Payer: Self-pay | Admitting: Cardiology

## 2014-11-11 ENCOUNTER — Ambulatory Visit (INDEPENDENT_AMBULATORY_CARE_PROVIDER_SITE_OTHER): Payer: Medicare PPO | Admitting: Cardiology

## 2014-11-11 VITALS — BP 118/64 | HR 68 | Ht 63.0 in | Wt 157.0 lb

## 2014-11-11 DIAGNOSIS — I498 Other specified cardiac arrhythmias: Secondary | ICD-10-CM

## 2014-11-11 DIAGNOSIS — I2510 Atherosclerotic heart disease of native coronary artery without angina pectoris: Secondary | ICD-10-CM | POA: Diagnosis not present

## 2014-11-11 DIAGNOSIS — R55 Syncope and collapse: Secondary | ICD-10-CM

## 2014-11-11 LAB — CBC WITH DIFFERENTIAL/PLATELET
Basophils Absolute: 0.1 10*3/uL (ref 0.0–0.1)
Basophils Relative: 1 % (ref 0–1)
EOS ABS: 0.1 10*3/uL (ref 0.0–0.7)
EOS PCT: 2 % (ref 0–5)
HEMATOCRIT: 39.8 % (ref 36.0–46.0)
HEMOGLOBIN: 13.7 g/dL (ref 12.0–15.0)
LYMPHS ABS: 3.2 10*3/uL (ref 0.7–4.0)
LYMPHS PCT: 49 % — AB (ref 12–46)
MCH: 27.3 pg (ref 26.0–34.0)
MCHC: 34.4 g/dL (ref 30.0–36.0)
MCV: 79.4 fL (ref 78.0–100.0)
MONO ABS: 0.3 10*3/uL (ref 0.1–1.0)
MONOS PCT: 5 % (ref 3–12)
MPV: 10 fL (ref 8.6–12.4)
Neutro Abs: 2.8 10*3/uL (ref 1.7–7.7)
Neutrophils Relative %: 43 % (ref 43–77)
Platelets: 241 10*3/uL (ref 150–400)
RBC: 5.01 MIL/uL (ref 3.87–5.11)
RDW: 13.1 % (ref 11.5–15.5)
WBC: 6.6 10*3/uL (ref 4.0–10.5)

## 2014-11-11 NOTE — Patient Instructions (Signed)
Medication Instructions:  No changes today  Labwork: TSH/CBCd/BMET today  Testing/Procedures: None today  Follow-Up: Your physician wants you to follow-up in:  6 months with Dr Aundra Dubin. (November 2016). You will receive a reminder letter in the mail two months in advance. If you don't receive a letter, please call our office to schedule the follow-up appointment.   Any Other Special Instructions Will Be Listed Below (If Applicable).  You have been referred to Dr Krista Blue, neurology.

## 2014-11-12 LAB — BASIC METABOLIC PANEL
BUN: 10 mg/dL (ref 6–23)
CO2: 30 mEq/L (ref 19–32)
CREATININE: 0.8 mg/dL (ref 0.50–1.10)
Calcium: 9.6 mg/dL (ref 8.4–10.5)
Chloride: 97 mEq/L (ref 96–112)
GLUCOSE: 103 mg/dL — AB (ref 70–99)
POTASSIUM: 3.5 meq/L (ref 3.5–5.3)
Sodium: 138 mEq/L (ref 135–145)

## 2014-11-12 LAB — TSH: TSH: 0.96 u[IU]/mL (ref 0.350–4.500)
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
## 2014-11-13 ENCOUNTER — Ambulatory Visit (INDEPENDENT_AMBULATORY_CARE_PROVIDER_SITE_OTHER): Payer: Medicare PPO | Admitting: Neurology

## 2014-11-13 ENCOUNTER — Encounter: Payer: Self-pay | Admitting: Neurology

## 2014-11-13 VITALS — BP 127/69 | HR 61 | Ht 63.0 in | Wt 155.0 lb

## 2014-11-13 DIAGNOSIS — R410 Disorientation, unspecified: Secondary | ICD-10-CM | POA: Diagnosis not present

## 2014-11-13 DIAGNOSIS — S0990XD Unspecified injury of head, subsequent encounter: Secondary | ICD-10-CM

## 2014-11-13 DIAGNOSIS — G43109 Migraine with aura, not intractable, without status migrainosus: Secondary | ICD-10-CM | POA: Diagnosis not present

## 2014-11-13 MED ORDER — LAMOTRIGINE 100 MG PO TABS: 100.0000 mg | ORAL_TABLET | Freq: Two times a day (BID) | ORAL | Status: DC

## 2014-11-13 MED ORDER — LAMOTRIGINE 25 MG PO TABS: ORAL_TABLET | ORAL | Status: DC
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
## 2014-11-18 ENCOUNTER — Other Ambulatory Visit: Payer: Self-pay

## 2014-11-18 ENCOUNTER — Ambulatory Visit (INDEPENDENT_AMBULATORY_CARE_PROVIDER_SITE_OTHER): Payer: Medicare PPO | Admitting: *Deleted

## 2014-11-18 DIAGNOSIS — R55 Syncope and collapse: Secondary | ICD-10-CM

## 2014-11-18 DIAGNOSIS — Z1231 Encounter for screening mammogram for malignant neoplasm of breast: Secondary | ICD-10-CM

## 2014-11-19 ENCOUNTER — Other Ambulatory Visit: Payer: Medicare PPO

## 2014-11-20 NOTE — Progress Notes (Signed)
Loop recorder 

## 2014-11-24 ENCOUNTER — Inpatient Hospital Stay: Admission: RE | Admit: 2014-11-24 | Payer: Medicare PPO | Source: Ambulatory Visit

## 2014-11-27 ENCOUNTER — Other Ambulatory Visit: Payer: Self-pay | Admitting: Obstetrics and Gynecology

## 2014-11-27 ENCOUNTER — Ambulatory Visit (INDEPENDENT_AMBULATORY_CARE_PROVIDER_SITE_OTHER): Payer: Medicare PPO | Admitting: Neurology

## 2014-11-27 DIAGNOSIS — G43109 Migraine with aura, not intractable, without status migrainosus: Secondary | ICD-10-CM | POA: Diagnosis not present

## 2014-11-27 DIAGNOSIS — S0990XD Unspecified injury of head, subsequent encounter: Secondary | ICD-10-CM

## 2014-11-27 DIAGNOSIS — R410 Disorientation, unspecified: Secondary | ICD-10-CM

## 2014-11-27 LAB — CUP PACEART REMOTE DEVICE CHECK
MDC IDC SESS DTM: 20160604230819
MDC IDC SET ZONE DETECTION INTERVAL: 2000 ms
MDC IDC SET ZONE DETECTION INTERVAL: 370 ms
Zone Setting Detection Interval: 3000 ms

## 2014-11-28 ENCOUNTER — Encounter: Payer: Self-pay | Admitting: Internal Medicine

## 2014-11-28 LAB — CYTOLOGY - PAP

## 2014-11-28 NOTE — Procedures (Signed)
   HISTORY: 69 years old female, with confusion episodes.  TECHNIQUE:  16 channel EEG was performed based on standard 10-16 international system. One channel was dedicated to EKG, which has demonstrates normal sinus rhythm of beats per minutes.  Upon awakening, the posterior background activity was mildly dysarrhythmic, in low amplitude, mixed alpha and theta range, with amplitude of microvoltage, reactive to eye opening and closure.  There was no evidence of epileptiform discharge. There was frequent frontal muscle artifact.   Photic stimulation was performed, which induced a symmetric photic driving.  Hyperventilation was performed, there was no abnormality elicit.  No sleep was achieved.  CONCLUSION: This is a mild abnormal EEG.  There is mild background slowing, common etiology is metabolic toxic.

## 2014-12-01 ENCOUNTER — Encounter: Payer: Self-pay | Admitting: Internal Medicine

## 2014-12-02 ENCOUNTER — Encounter: Payer: Self-pay | Admitting: Internal Medicine

## 2014-12-03 ENCOUNTER — Telehealth: Payer: Self-pay | Admitting: Internal Medicine

## 2014-12-03 NOTE — Telephone Encounter (Signed)
Ok with me 

## 2014-12-03 NOTE — Telephone Encounter (Signed)
Patient called to schedule and appointment with you. Your name is on her chart as her PCP, but she has never seen you before. Patient stated that she has seen Dr. Lenna Gilford for years and he gave her your name to establish care with you. Will you be able to take her on?

## 2014-12-05 ENCOUNTER — Encounter: Payer: Self-pay | Admitting: Internal Medicine

## 2014-12-08 ENCOUNTER — Other Ambulatory Visit: Payer: Self-pay

## 2014-12-18 ENCOUNTER — Ambulatory Visit (INDEPENDENT_AMBULATORY_CARE_PROVIDER_SITE_OTHER): Payer: Medicare PPO | Admitting: Internal Medicine

## 2014-12-18 ENCOUNTER — Ambulatory Visit (INDEPENDENT_AMBULATORY_CARE_PROVIDER_SITE_OTHER): Payer: Medicare PPO | Admitting: *Deleted

## 2014-12-18 ENCOUNTER — Encounter: Payer: Self-pay | Admitting: Internal Medicine

## 2014-12-18 ENCOUNTER — Other Ambulatory Visit (INDEPENDENT_AMBULATORY_CARE_PROVIDER_SITE_OTHER): Payer: Medicare PPO

## 2014-12-18 VITALS — BP 116/74 | HR 72 | Temp 97.8°F | Ht 63.0 in | Wt 149.0 lb

## 2014-12-18 DIAGNOSIS — R55 Syncope and collapse: Secondary | ICD-10-CM

## 2014-12-18 DIAGNOSIS — E041 Nontoxic single thyroid nodule: Secondary | ICD-10-CM | POA: Diagnosis not present

## 2014-12-18 DIAGNOSIS — Z23 Encounter for immunization: Secondary | ICD-10-CM | POA: Diagnosis not present

## 2014-12-18 DIAGNOSIS — I10 Essential (primary) hypertension: Secondary | ICD-10-CM | POA: Diagnosis not present

## 2014-12-18 DIAGNOSIS — Z0189 Encounter for other specified special examinations: Secondary | ICD-10-CM

## 2014-12-18 DIAGNOSIS — Z0000 Encounter for general adult medical examination without abnormal findings: Secondary | ICD-10-CM

## 2014-12-18 DIAGNOSIS — Z0001 Encounter for general adult medical examination with abnormal findings: Secondary | ICD-10-CM | POA: Insufficient documentation

## 2014-12-18 LAB — CBC WITH DIFFERENTIAL/PLATELET
BASOS PCT: 0.4 % (ref 0.0–3.0)
Basophils Absolute: 0 10*3/uL (ref 0.0–0.1)
EOS PCT: 2.4 % (ref 0.0–5.0)
Eosinophils Absolute: 0.1 10*3/uL (ref 0.0–0.7)
HCT: 40.5 % (ref 36.0–46.0)
Hemoglobin: 13.4 g/dL (ref 12.0–15.0)
Lymphocytes Relative: 42.5 % (ref 12.0–46.0)
Lymphs Abs: 2.5 10*3/uL (ref 0.7–4.0)
MCHC: 33.2 g/dL (ref 30.0–36.0)
MCV: 82.4 fl (ref 78.0–100.0)
MONO ABS: 0.3 10*3/uL (ref 0.1–1.0)
MONOS PCT: 4.7 % (ref 3.0–12.0)
NEUTROS PCT: 50 % (ref 43.0–77.0)
Neutro Abs: 2.9 10*3/uL (ref 1.4–7.7)
Platelets: 237 10*3/uL (ref 150.0–400.0)
RBC: 4.91 Mil/uL (ref 3.87–5.11)
RDW: 12.9 % (ref 11.5–15.5)
WBC: 5.9 10*3/uL (ref 4.0–10.5)

## 2014-12-18 LAB — BASIC METABOLIC PANEL
BUN: 15 mg/dL (ref 6–23)
CALCIUM: 10.4 mg/dL (ref 8.4–10.5)
CHLORIDE: 102 meq/L (ref 96–112)
CO2: 28 meq/L (ref 19–32)
CREATININE: 0.93 mg/dL (ref 0.40–1.20)
GFR: 63.47 mL/min (ref >60.00)
Glucose, Bld: 89 mg/dL (ref 70–99)
Potassium: 4.1 mEq/L (ref 3.5–5.1)
Sodium: 140 mEq/L (ref 135–145)

## 2014-12-18 LAB — LIPID PANEL
Cholesterol: 154 mg/dL (ref 0–200)
HDL: 63.6 mg/dL (ref >39.00)
LDL Cholesterol: 77 mg/dL (ref 0–99)
NONHDL: 90.4
Total CHOL/HDL Ratio: 2
Triglycerides: 66 mg/dL (ref 0.0–149.0)
VLDL: 13.2 mg/dL (ref 0.0–40.0)

## 2014-12-18 LAB — CUP PACEART REMOTE DEVICE CHECK: MDC IDC SESS DTM: 20160708124043

## 2014-12-18 LAB — URINALYSIS, ROUTINE W REFLEX MICROSCOPIC
BILIRUBIN URINE: NEGATIVE
HGB URINE DIPSTICK: NEGATIVE
Leukocytes, UA: NEGATIVE
NITRITE: NEGATIVE
Specific Gravity, Urine: 1.02 (ref 1.000–1.030)
Total Protein, Urine: NEGATIVE
UROBILINOGEN UA: 0.2 (ref 0.0–1.0)
Urine Glucose: NEGATIVE
pH: 6.5 (ref 5.0–8.0)

## 2014-12-18 LAB — HEPATIC FUNCTION PANEL
ALK PHOS: 91 U/L (ref 39–117)
ALT: 24 U/L (ref 0–35)
AST: 24 U/L (ref 0–37)
Albumin: 4.4 g/dL (ref 3.5–5.2)
Bilirubin, Direct: 0.2 mg/dL (ref 0.0–0.3)
Total Bilirubin: 1 mg/dL (ref 0.2–1.2)
Total Protein: 7.2 g/dL (ref 6.0–8.3)

## 2014-12-18 LAB — TSH: TSH: 0.76 u[IU]/mL (ref 0.35–4.50)

## 2014-12-18 MED ORDER — PNEUMOCOCCAL 13-VAL CONJ VACC IM SUSP
0.5000 mL | Freq: Once | INTRAMUSCULAR | Status: AC
  Administered 2014-12-18: 0.5 mL via INTRAMUSCULAR

## 2014-12-18 NOTE — Progress Notes (Signed)
Pre visit review using our clinic review tool, if applicable. No additional management support is needed unless otherwise documented below in the visit note. 

## 2014-12-18 NOTE — Patient Instructions (Addendum)
You had the new Prevnar pneumonia shot today  Please continue all other medications as before, and refills have been done if requested.  Please have the pharmacy call with any other refills you may need.  Please continue your efforts at being more active, low cholesterol diet, and weight control.  You are otherwise up to date with prevention measures today.  Please keep your appointments with your specialists as you may have planned  Please go to the LAB in the Basement (turn left off the elevator) for the tests to be done today  You will be contacted by phone if any changes need to be made immediately.  Otherwise, you will receive a letter about your results with an explanation, but please check with MyChart first.  Please remember to sign up for MyChart if you have not done so, as this will be important to you in the future with finding out test results, communicating by private email, and scheduling acute appointments online when needed.  Please return in 1 year for your yearly visit, or sooner if needed, with Lab testing done 3-5 days before

## 2014-12-18 NOTE — Progress Notes (Signed)
Subjective:    Patient ID: Renee [name], female    DOB: [birth year], 69 y.o.   MRN: [id number]  HPI  Here for wellness and f/u;  Overall doing ok;  Pt denies Chest pain, worsening SOB, DOE, wheezing, orthopnea, PND, worsening LE edema, palpitations, dizziness or syncope.  Pt denies neurological change such as new headache, facial or extremity weakness.  Pt denies polydipsia, polyuria, or low sugar symptoms. Pt states overall good compliance with treatment and medications, good tolerability, and has been trying to follow appropriate diet.  Pt denies worsening depressive symptoms, suicidal ideation or panic. No fever, night sweats, wt loss, loss of appetite, or other constitutional symptoms.  Pt states good ability with ADL's, has low fall risk, home safety reviewed and adequate, no other significant changes in hearing or vision, and only occasionally active with exercise.   . Mentions spells not explained so far with mult MD's of being paralyzed and altered mental status/? Sleep that last several hours. Has hx of migraine with balance worse with pain.  Mother died recently with dementia, pt was her primary caretaker.  Low sugar has been ruled out.  Has seen Dr Stana Bunting, had recent EEG, results pending.  MRI with small vessel disease recenlty.  Pt has declined to take med rx for possible siezure.  Often has palpiations occas. Due for prevnar.  Has known hemorrhoids with occas discomfort.  To see Dr Lenna Gilford soon for arthritis, Dr Buddy Duty for endo, also has loop recorder in place Past Medical History  Diagnosis Date  . Bronchitis     Recurrent  . Hypertension   . Chronic chest pain     a. 2005 Cath: Mild nonobstructive plaque (30-40% LCX);  b. 08/2011 low risk myoview;  c. 09/2011 Coronary CT: NL cors w ca score of 0.  . AV nodal re-entry tachycardia     a. s/p RFA 1993- Dr. Rolland Porter, [place]  . Long QT interval     a. mild - advised to avoid meds that may prolong QT.  Marland Kitchen GERD (gastroesophageal reflux disease)     . Dysphagia   . Duodenitis without hemorrhage   . Diverticulosis of colon   . Irritable bowel syndrome   . Constipation, chronic   . Hemorrhoids   . Fecal incontinence   . Thyroid nodule   . Degenerative joint disease     a. s/p L Total Knee Arthroplasty.  . Fibromyalgia   . Chronic pain syndrome   . Neuropathy   . Anxiety   . Pre-syncope   . Cancer     hx of cervical   ca  . SVT (supraventricular tachycardia)     a. nonsustained SVT - previously offered flecainide but refused;  b. 12/2012 30 day event monitor w/o significant arrhythmias.  . Osteoporosis   . Diet-controlled type 2 diabetes mellitus   . Concussion     1979, 1993  . Broken neck     1993   Past Surgical History  Procedure Laterality Date  . Biopsy thyroid    . Total hip arthroplasty  10/10    Left, at [facility]  . Hysteroscopy  2002    with resection of endometrial polyps. by Dr.MCPhail  . Coronary angioplasty  2007  . Cardiac electrophysiology study and ablation      atrioventricular nodal reentant tachycardia  . Ablation of dysrhythmic focus  1993  . Cholecystectomy  1980  . Orif radial head / neck fracture  09/05/88  . Colonoscopy  06/25/2008  Diverticulosis and Hemorrhoids  . Esophagogastroduodenoscopy    . Loop recorder implant  08/21/2013    MDT LinQ implanted by Dr Ladona Ridgel for syncope  . Tilt table study N/A 05/15/2013    Procedure: TILT TABLE STUDY;  Surgeon: Duke Salvia, MD;  Location: [facility] CATH LAB;  Service: Cardiovascular;  Laterality: N/A;  . Loop recorder implant N/A 08/21/2013    Procedure: LOOP RECORDER IMPLANT;  Surgeon: Marinus Maw, MD;  Location: [facility] CATH LAB;  Service: Cardiovascular;  Laterality: N/A;    reports that she has never smoked. She has never used smokeless tobacco. She reports that she drinks alcohol. She reports that she does not use illicit drugs. family history includes Asthma in her mother and sister; Diabetes in her brother, mother, and sister; Heart disease  in her sister and sister; Lung cancer in her father; Ovarian cancer in her mother; Stroke in her mother. Allergies  Allergen Reactions  . Cyclobenzaprine Other (See Comments)    Lethargy  . Adhesive [Tape] Itching, Rash and Other (See Comments)    Blisters also from EKG pads adhesive backing  . Moxifloxacin Other (See Comments)    REACTION: pt states she can't take Avelox... Unsure of reaction  . Quinolones Other (See Comments)    Years ago as a adult but no recollection of reaction type.   Current Outpatient Prescriptions on File Prior to Visit  Medication Sig Dispense Refill  . ALPRAZolam (XANAX) 0.5 MG tablet Take 0.25-0.5 mg by mouth 2 (two) times daily as needed for anxiety.    Marland Kitchen aspirin EC 81 MG tablet Take 81 mg by mouth every morning.     . Blood Glucose Monitoring Suppl (ACCU-CHEK NANO SMARTVIEW) W/DEVICE KIT Test once daily as directed 1 kit 0  . CARTIA XT 120 MG 24 hr capsule TAKE 1 CAPSULE (120 MG TOTAL) BY MOUTH DAILY. 90 capsule 3  . flecainide (TAMBOCOR) 50 MG tablet Take 0.5 tablets (25 mg total) by mouth 2 (two) times daily. 10 tablet 0  . glucose blood (ACCU-CHEK SMARTVIEW) test strip Test 1 time daily 100 each 1  . naproxen sodium (ALEVE) 220 MG tablet Take 440-660 mg by mouth every 4 (four) hours as needed (for pain).     . nitroGLYCERIN (NITROSTAT) 0.4 MG SL tablet Place 0.4 mg under the tongue every 5 (five) minutes as needed for chest pain. For chest pain    . pantoprazole (PROTONIX) 40 MG tablet Take 80 mg by mouth daily.    Marland Kitchen triamterene-hydrochlorothiazide (DYAZIDE) 37.5-25 MG per capsule Take 1 each (1 capsule total) by mouth daily. 90 capsule 2  . zolpidem (AMBIEN) 10 MG tablet Take 10 mg by mouth at bedtime as needed for sleep.    . Diclofenac Potassium (CAMBIA) 50 MG PACK Take 50 mg by mouth as needed (Migraine). Do not exceed 1 packet in 24 hours. (Patient not taking: Reported on 12/18/2014) 9 each 6  . lamoTRIgine (LAMICTAL) 100 MG tablet Take 1 tablet (100 mg  total) by mouth 2 (two) times daily. (Patient not taking: Reported on 12/18/2014) 60 tablet 6  . lamoTRIgine (LAMICTAL) 25 MG tablet One po bid xone week, then 2 tabs po bid xone week, 3 tabs po bid (Patient not taking: Reported on 12/18/2014) 100 tablet 0  . potassium chloride (MICRO-K) 10 MEQ CR capsule Take 1 capsule (10 mEq total) by mouth daily. (Patient not taking: Reported on 12/18/2014) 30 capsule 3   No current facility-administered medications on file prior to visit.  Review of Systems Constitutional: Negative for increased diaphoresis, other activity, appetite or siginficant weight change other than noted HENT: Negative for worsening hearing loss, ear pain, facial swelling, mouth sores and neck stiffness.   Eyes: Negative for other worsening pain, redness or visual disturbance.  Respiratory: Negative for shortness of breath and wheezing  Cardiovascular: Negative for chest pain and palpitations.  Gastrointestinal: Negative for diarrhea, blood in stool, abdominal distention or other pain Genitourinary: Negative for hematuria, flank pain or change in urine volume.  Musculoskeletal: Negative for myalgias or other joint complaints.  Skin: Negative for color change and wound or drainage.  Neurological: Negative for syncope and numbness. other than noted Hematological: Negative for adenopathy. or other swelling Psychiatric/Behavioral: Negative for hallucinations, SI, self-injury, decreased concentration or other worsening agitation.      Objective:   Physical Exam BP 116/74 mmHg  Pulse 72  Temp(Src) 97.8 F (36.6 C) (Oral)  Ht $R'5\' 3"'rG$  (1.6 m)  Wt 149 lb (67.586 kg)  BMI 26.40 kg/m2  SpO2 98% VS noted,  Constitutional: Pt is oriented to person, place, and time. Appears well-developed and well-nourished, in no significant distress Head: Normocephalic and atraumatic.  Right Ear: External ear normal.  Left Ear: External ear normal.  Nose: Nose normal.  Mouth/Throat: Oropharynx is  clear and moist.  Eyes: Conjunctivae and EOM are normal. Pupils are equal, round, and reactive to light.  Neck: Normal range of motion. Neck supple. No JVD present. No tracheal deviation present or significant neck LA or mass Cardiovascular: Normal rate, regular rhythm, normal heart sounds and intact distal pulses.   Pulmonary/Chest: Effort normal and breath sounds without rales or wheezing  Abdominal: Soft. Bowel sounds are normal. NT. No HSM  Musculoskeletal: Normal range of motion. Exhibits no edema.  Lymphadenopathy:  Has no cervical adenopathy.  Neurological: Pt is alert and oriented to person, place, and time. Pt has normal reflexes. No cranial nerve deficit. Motor grossly intact Skin: Skin is warm and dry. No rash noted.  Psychiatric:  Has very nervous mood and affect, pressured speech, does not listen to questions well, keeps talking, Behavior is o/w normal.     Assessment & Plan:

## 2014-12-18 NOTE — Assessment & Plan Note (Signed)

## 2014-12-18 NOTE — Addendum Note (Signed)
Addended by: Lyman Bishop on: 12/18/2014 03:40 PM   Modules accepted: Orders

## 2014-12-23 ENCOUNTER — Encounter: Payer: Self-pay | Admitting: Internal Medicine

## 2014-12-23 NOTE — Progress Notes (Signed)
Loop recorder 

## 2014-12-24 ENCOUNTER — Telehealth: Payer: Self-pay | Admitting: Neurology

## 2014-12-24 NOTE — Telephone Encounter (Signed)
Please call patient, EEG showed mild background slowing, this could be related to previous history of severe brain trauma, medicine side effect, there was no evidence of seizure-like discharge,  She is to continue current medications, need a follow-up in 2-4 weeks

## 2014-12-24 NOTE — Telephone Encounter (Signed)
Left message for a return call

## 2014-12-24 NOTE — Telephone Encounter (Signed)
EEG results given to paitent - follow up appt schedule to further discuss.

## 2014-12-24 NOTE — Telephone Encounter (Signed)
Patient is calling to get EEG results. Please call

## 2014-12-29 ENCOUNTER — Telehealth: Payer: Self-pay | Admitting: Internal Medicine

## 2014-12-29 DIAGNOSIS — M1990 Unspecified osteoarthritis, unspecified site: Secondary | ICD-10-CM

## 2014-12-29 NOTE — Telephone Encounter (Signed)
Patient has been seeing Dr. Izola Price for there arthritis and she has been very unhappy with her. Can you please refer her to another arthritis doctor. Please advise pt.

## 2014-12-30 ENCOUNTER — Ambulatory Visit
Admission: RE | Admit: 2014-12-30 | Discharge: 2014-12-30 | Disposition: A | Discharge status: Home / Self Care | Payer: Medicare PPO | Source: Ambulatory Visit

## 2014-12-30 DIAGNOSIS — Z1231 Encounter for screening mammogram for malignant neoplasm of breast: Secondary | ICD-10-CM

## 2014-12-30 NOTE — Telephone Encounter (Signed)
Pt is requesting a referral to rheumatology

## 2014-12-30 NOTE — Telephone Encounter (Signed)
Pt may do better with a pain management specialist  Ok to contact pt - would she want this referral, (or a referral to a different rheumatologist as she suggested)  ?

## 2014-12-31 ENCOUNTER — Encounter: Payer: Self-pay | Admitting: *Deleted

## 2014-12-31 ENCOUNTER — Telehealth: Payer: Self-pay | Admitting: Neurology

## 2014-12-31 NOTE — Telephone Encounter (Signed)
Pt calling and states that her insurance has just taken effect.[facility] [employer], mail order Rx. She will not receive her medication for anther 10 days. She is concerned because she was told by Dr. Krista Blue that she " must" be on her medication as soon as possible, due to her EEG results. Pt wants to know if she can have a Rx for the 10 days until she receives the medication from the mail. Please call and advise. [phone].

## 2014-12-31 NOTE — Telephone Encounter (Signed)
Called [facility] - she has never filled the original rx sent in at her office visit on 11/13/14 - pharmacy confirmed it has been ready for pick up.  Said she decided to wait until she received her EEG results.  The results were given on 12/24/14 but she has still not started the medication.  She will pick it up from the pharmacy and start it today.  She will keep her follow up appt.

## 2015-01-01 ENCOUNTER — Other Ambulatory Visit: Payer: Self-pay

## 2015-01-01 MED ORDER — LAMOTRIGINE 100 MG PO TABS: 100.0000 mg | ORAL_TABLET | Freq: Two times a day (BID) | ORAL | Status: DC

## 2015-01-01 NOTE — Telephone Encounter (Signed)
Referral done

## 2015-01-14 ENCOUNTER — Telehealth: Payer: Self-pay | Admitting: Internal Medicine

## 2015-01-14 ENCOUNTER — Telehealth: Payer: Self-pay

## 2015-01-14 MED ORDER — ZOLPIDEM TARTRATE 10 MG PO TABS: 10.0000 mg | ORAL_TABLET | Freq: Every evening | ORAL | Status: DC | PRN

## 2015-01-14 NOTE — Telephone Encounter (Signed)
Rx faxed to pharmacy  

## 2015-01-14 NOTE — Telephone Encounter (Signed)
Patient states that she needs zolpidem (AMBIEN) 10 MG tablet [[id number]] sent to [facility] mail service.

## 2015-01-14 NOTE — Telephone Encounter (Signed)
Patient called in asking for [facility] Rx sent to local [facility].  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  She currently has a Rx for Nitro but does not have it with her (coming to her via mailorder) [facility] in [place], her cell phone  mailbox is full, so I could not even send her a message.

## 2015-01-14 NOTE — Telephone Encounter (Signed)
Done hardcopy to dahlia/LIM B  

## 2015-01-15 ENCOUNTER — Encounter: Payer: Self-pay | Admitting: Internal Medicine

## 2015-01-16 ENCOUNTER — Ambulatory Visit (INDEPENDENT_AMBULATORY_CARE_PROVIDER_SITE_OTHER): Payer: Medicare PPO | Admitting: *Deleted

## 2015-01-16 DIAGNOSIS — R55 Syncope and collapse: Secondary | ICD-10-CM

## 2015-01-20 NOTE — Progress Notes (Signed)
Loop recorder 

## 2015-01-22 ENCOUNTER — Telehealth: Payer: Self-pay | Admitting: Internal Medicine

## 2015-01-22 NOTE — Telephone Encounter (Signed)
I have not seen a PA for this pt. Does one need to be started.

## 2015-01-22 NOTE — Telephone Encounter (Signed)
[facility] called regarding the zolpidem (AMBIEN) 10 MG tablet [[id number]] script. Max amt for patient is supposed to be 5mg , but 10mg  prescribed. Please call them to advise that this is ok.

## 2015-01-22 NOTE — Telephone Encounter (Signed)
[facility] needs new request for the [facility] and the fax # [phone] -

## 2015-01-23 ENCOUNTER — Telehealth: Payer: Self-pay

## 2015-01-23 NOTE — Telephone Encounter (Signed)
PA started via cover my meds.  

## 2015-01-26 ENCOUNTER — Encounter: Payer: Self-pay | Admitting: Gastroenterology

## 2015-01-26 ENCOUNTER — Ambulatory Visit (INDEPENDENT_AMBULATORY_CARE_PROVIDER_SITE_OTHER): Payer: Medicare PPO | Admitting: Gastroenterology

## 2015-01-26 VITALS — BP 126/80 | HR 72 | Ht 63.0 in | Wt 150.2 lb

## 2015-01-26 DIAGNOSIS — K648 Other hemorrhoids: Secondary | ICD-10-CM | POA: Diagnosis not present

## 2015-01-26 DIAGNOSIS — R143 Flatulence: Secondary | ICD-10-CM | POA: Diagnosis not present

## 2015-01-26 DIAGNOSIS — K921 Melena: Secondary | ICD-10-CM

## 2015-01-26 DIAGNOSIS — K589 Irritable bowel syndrome without diarrhea: Secondary | ICD-10-CM | POA: Diagnosis not present

## 2015-01-26 LAB — CUP PACEART REMOTE DEVICE CHECK
Date Time Interrogation Session: 20160813025616
MDC IDC SET ZONE DETECTION INTERVAL: 3000 ms
MDC IDC SET ZONE DETECTION INTERVAL: 370 ms
Zone Setting Detection Interval: 2000 ms

## 2015-01-26 NOTE — Progress Notes (Signed)
    History of Present Illness: This is a 69 year old presenting for the evaluation of rectal bleeding, hemorrhoids, flatulence. She was previously followed by Dr. Verl Blalock. She also has a long history of alternating diarrhea and constipation and has been diagnosed with IBS. Over the past several years she has had intermittent small-volume rectal bleeding associated with bowel movements and prolapsing that she presumes to be hemorrhoids. Colonoscopy by Dr. Sharlett Iles in 06/2008 showed mild left-sided diverticulosis and large internal hemorrhoids. Random biopsies obtained to rule out microscopic colitis were negative. Recent blood work including a CBC, BMET, hepatic function panel, lipid panel and TSH were all unremarkable. Denies weight loss, abdominal pain, change in stool caliber, melena, nausea, vomiting, dysphagia, reflux symptoms, chest pain.   Review of Systems: Pertinent positive and negative review of systems were noted in the above HPI section. All other review of systems were otherwise negative.  Current Medications, Allergies, Past Medical History, Past Surgical History, Family History and Social History were reviewed in [employer] record.  Physical Exam: General: Well developed, well nourished, no acute distress Head: Normocephalic and atraumatic Eyes:  sclerae anicteric, EOMI Ears: Normal auditory acuity Mouth: No deformity or lesions Neck: Supple, no masses or thyromegaly Lungs: Clear throughout to auscultation Heart: Regular rate and rhythm; no murmurs, rubs or bruits Abdomen: Soft, non tender and non distended. No masses, hepatosplenomegaly or hernias noted. Normal Bowel sounds Rectal: Small external hemorrhoidal tags, no internal lesions, heme positive brown stool Musculoskeletal: Symmetrical with no gross deformities  Skin: No lesions on visible extremities Pulses:  Normal pulses noted Extremities: No clubbing, cyanosis, edema or deformities  noted Neurological: Alert oriented x 4, grossly nonfocal Cervical Nodes:  No significant cervical adenopathy Inguinal Nodes: No significant inguinal adenopathy Psychological:  Alert and cooperative. Anxious.   Assessment and Recommendations:  1. Hematochezia, Hemoccult-positive stool and presumed prolapsing hemorrhoids. R/O colorectal neoplasms. Schedule colonoscopy. The risks (including bleeding, perforation, infection, missed lesions, medication reactions and possible hospitalization or surgery if complications occur), benefits, and alternatives to colonoscopy with possible biopsy and possible polypectomy were discussed with the patient and they consent to proceed. Consider hemorrhoidal banding or surgical referral if prolapsing hemorrhoids are determined to be the cause of her symptoms.  2. IBS with alternating diarrhea and constipation. Flatulence. May use stool softener or mild laxity such as MiraLAX during constipation phases and discontinue if diarrhea starts. Given dietary information on a low gas diet. Gas-X 4 times a day as needed.

## 2015-01-26 NOTE — Patient Instructions (Signed)
You have been scheduled for a colonoscopy. Please follow written instructions given to you at your visit today.  Please pick up your prep supplies at the pharmacy within the next 1-3 days. If you use inhalers (even only as needed), please bring them with you on the day of your procedure. Your physician has requested that you go to www.startemmi.com and enter the access code given to you at your visit today. This web site gives a general overview about your procedure. However, you should still follow specific instructions given to you by our office regarding your preparation for the procedure.  Thank you for choosing me and  Gastroenterology.  Malcolm T. Stark, Jr., MD., FACG  

## 2015-01-27 ENCOUNTER — Ambulatory Visit (INDEPENDENT_AMBULATORY_CARE_PROVIDER_SITE_OTHER): Payer: Medicare PPO | Admitting: Neurology

## 2015-01-27 ENCOUNTER — Encounter: Payer: Self-pay | Admitting: Neurology

## 2015-01-27 ENCOUNTER — Other Ambulatory Visit: Payer: Self-pay | Admitting: Obstetrics and Gynecology

## 2015-01-27 VITALS — BP 138/82 | HR 72 | Ht 63.0 in | Wt 151.0 lb

## 2015-01-27 DIAGNOSIS — R410 Disorientation, unspecified: Secondary | ICD-10-CM

## 2015-01-27 DIAGNOSIS — G43109 Migraine with aura, not intractable, without status migrainosus: Secondary | ICD-10-CM

## 2015-01-27 DIAGNOSIS — S0990XD Unspecified injury of head, subsequent encounter: Secondary | ICD-10-CM
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
## 2015-01-29 ENCOUNTER — Other Ambulatory Visit: Payer: Self-pay

## 2015-01-29 ENCOUNTER — Encounter (HOSPITAL_COMMUNITY)
Admission: RE | Admit: 2015-01-29 | Discharge: 2015-01-29 | Disposition: A | Discharge status: Home / Self Care | Payer: Medicare PPO | Source: Ambulatory Visit | Attending: Obstetrics and Gynecology | Admitting: Obstetrics and Gynecology

## 2015-01-29 ENCOUNTER — Encounter (HOSPITAL_COMMUNITY): Payer: Self-pay

## 2015-01-29 DIAGNOSIS — Z01818 Encounter for other preprocedural examination: Secondary | ICD-10-CM | POA: Insufficient documentation

## 2015-01-29 LAB — CBC
HCT: 38.8 % (ref 36.0–46.0)
HEMOGLOBIN: 12.9 g/dL (ref 12.0–15.0)
MCH: 27.6 pg (ref 26.0–34.0)
MCHC: 33.2 g/dL (ref 30.0–36.0)
MCV: 82.9 fL (ref 78.0–100.0)
Platelets: 212 10*3/uL (ref 150–400)
RBC: 4.68 MIL/uL (ref 3.87–5.11)
RDW: 13.2 % (ref 11.5–15.5)
WBC: 5.2 10*3/uL (ref 4.0–10.5)

## 2015-01-29 LAB — ABO/RH: ABO/RH(D): A POS

## 2015-01-29 LAB — BASIC METABOLIC PANEL
Anion gap: 7 (ref 5–15)
BUN: 14 mg/dL (ref 6–20)
CHLORIDE: 102 mmol/L (ref 101–111)
CO2: 30 mmol/L (ref 22–32)
CREATININE: 0.71 mg/dL (ref 0.44–1.00)
Calcium: 9.7 mg/dL (ref 8.9–10.3)
GFR calc non Af Amer: >60 mL/min (ref >60)
GLUCOSE: 98 mg/dL (ref 65–99)
Potassium: 4 mmol/L (ref 3.5–5.1)
Sodium: 139 mmol/L (ref 135–145)

## 2015-01-29 LAB — TYPE AND SCREEN
ABO/RH(D): A POS
Antibody Screen: NEGATIVE

## 2015-01-29 NOTE — Pre-Procedure Instructions (Signed)
Patient had an abnormal EKG at PAT visit. Dr. Glennon Mac aware and he spoke with pt about her significant history.

## 2015-01-29 NOTE — Patient Instructions (Signed)
Your procedure is scheduled on:  February 05, 2015    Enter through the Main Entrance of [facility] at:  10:45 am   Pick up the phone at the desk and dial [phone].  Call this number if you have problems the morning of surgery: [phone].  Remember: Do NOT eat food:  After midnight on Wednesday night  Do NOT drink clear liquids after:  8:15 am day of surgery  Take these medicines the morning of surgery with a SIP OF WATER:  cardizem CD, Tambocor and Dyazide    Do NOT wear jewelry (body piercing), metal hair clips/bobby pins, make-up, or nail polish. Do NOT wear lotions, powders, or perfumes.  You may wear deoderant. Do NOT shave for 48 hours prior to surgery. Do NOT bring valuables to the hospital. Contacts, dentures, or bridgework may not be worn into surgery. Have a responsible adult drive you home and stay with you for 24 hours after your procedure

## 2015-01-29 NOTE — Anesthesia Preprocedure Evaluation (Addendum)
Anesthesia Evaluation  Patient identified by MRN, date of birth, ID band Patient awake    Reviewed: Allergy & Precautions, H&P , Patient's Chart, lab work & pertinent test results  Airway Mallampati: II  TM Distance: >3 FB Neck ROM: full    Dental no notable dental hx.    Pulmonary  breath sounds clear to auscultation  Pulmonary exam normal       Cardiovascular Exercise Tolerance: Good hypertension, Rhythm:regular Rate:Normal     Neuro/Psych    GI/Hepatic   Endo/Other  diabetes  Renal/GU      Musculoskeletal   Abdominal   Peds  Hematology   Anesthesia Other Findings Hypertension    Chronic chest pain   a. 2005 Cath: Mild nonobstructive plaque (30-40% LCX);  b. 08/2011 low risk myoview;  c. 09/2011 Coronary CT: NL cors w ca score of 0.   AV nodal re-entry tachycardia  a. s/p RFA 1993- Dr. Rolland Porter, [facility]  Long QT interval . mild - advised to avoid meds that may prolong QT.  GERD (gastroesophageal reflux disease)         losis of colon         Fibromyalgia   Chronic pain syndrome    Neuropathy    Anxiety   SVT (supraventricular tachycardia)   a. nonsustained SVT  Osteoporosis      Reproductive/Obstetrics                            Anesthesia Physical Anesthesia Plan  ASA: II  Anesthesia Plan: Spinal   Post-op Pain Management:    Induction:   Airway Management Planned:   Additional Equipment:   Intra-op Plan:   Post-operative Plan:   Informed Consent: I have reviewed the patients History and Physical, chart, labs and discussed the procedure including the risks, benefits and alternatives for the proposed anesthesia with the patient or authorized representative who has indicated his/her understanding and acceptance.   Dental Advisory Given  Plan Discussed with: CRNA  Anesthesia Plan Comments: (Lab work confirmed with CRNA in room. Platelets okay. Discussed  spinal anesthetic, and patient consents to the procedure:  included risk of possible headache,backache, failed block, allergic reaction, and nerve injury. This patient was asked if she had any questions or concerns before the procedure started. )        Anesthesia Quick Evaluation

## 2015-01-30 ENCOUNTER — Telehealth: Payer: Self-pay | Admitting: Cardiology

## 2015-01-30 NOTE — Telephone Encounter (Signed)
PA has been approved.  Fax sent to pharmacy.

## 2015-01-30 NOTE — Telephone Encounter (Signed)
Pt states she is scheduled for surgery 02/05/15, pt asking Dr Claris Gladden recommendation regarding having saddle block or other anesthesia  prior to surgery. Pt advised to ask surgeon to fax surgical information/ requests to Dr Aundra Dubin for review.  Pt verbalized understanding.

## 2015-01-30 NOTE — Telephone Encounter (Signed)
New message      Talk to [facility] regarding upcoming surgery.  She would not give me any more info---do no know if we are doing the procedure or someone else.  She insisted on talking to [employer]

## 2015-02-02 ENCOUNTER — Telehealth: Payer: Self-pay | Admitting: Internal Medicine

## 2015-02-02 DIAGNOSIS — Z0289 Encounter for other administrative examinations: Secondary | ICD-10-CM

## 2015-02-02 MED ORDER — NITROGLYCERIN 0.4 MG SL SUBL: 0.4000 mg | SUBLINGUAL_TABLET | SUBLINGUAL | Status: DC | PRN

## 2015-02-02 NOTE — Telephone Encounter (Signed)
Pt called in and need refill for nitroGLYCERIN (NITROSTAT) 0.4 MG SL tablet  To be sent [place]

## 2015-02-02 NOTE — Telephone Encounter (Signed)
Refill sent to [facility] 

## 2015-02-03 ENCOUNTER — Telehealth: Payer: Self-pay

## 2015-02-03 MED ORDER — NITROGLYCERIN 0.4 MG SL SUBL: 0.4000 mg | SUBLINGUAL_TABLET | SUBLINGUAL | Status: DC | PRN

## 2015-02-03 MED ORDER — ZOLPIDEM TARTRATE 5 MG PO TABS: 5.0000 mg | ORAL_TABLET | Freq: Every evening | ORAL | Status: DC | PRN

## 2015-02-03 NOTE — Telephone Encounter (Signed)
Done hardcopy to Dahlia  

## 2015-02-03 NOTE — Telephone Encounter (Signed)
Pt came into the clinic stating that her Ambien is being denied by her insurance company due to her age but she actually take 1/2 tablet QHS. Pt is requesting a refill of Xanax and Ambien (5mg ), please advise

## 2015-02-04 MED ORDER — ZOLPIDEM TARTRATE 5 MG PO TABS: 5.0000 mg | ORAL_TABLET | Freq: Every evening | ORAL | Status: DC | PRN

## 2015-02-04 MED ORDER — ALPRAZOLAM 0.5 MG PO TABS: 0.2500 mg | ORAL_TABLET | Freq: Two times a day (BID) | ORAL | Status: DC | PRN

## 2015-02-04 NOTE — Telephone Encounter (Signed)
Pt is requesting refills of Xanax and Ambien to [facility]. Rx printed, sign and faxed

## 2015-02-05 ENCOUNTER — Ambulatory Visit (HOSPITAL_COMMUNITY): Payer: Medicare PPO | Admitting: Anesthesiology

## 2015-02-05 ENCOUNTER — Ambulatory Visit (HOSPITAL_COMMUNITY): Payer: Medicare PPO

## 2015-02-05 ENCOUNTER — Ambulatory Visit (HOSPITAL_COMMUNITY)
Admission: RE | Admit: 2015-02-05 | Discharge: 2015-02-05 | Disposition: A | Discharge status: Home / Self Care | Payer: Medicare PPO | Source: Ambulatory Visit | Attending: Obstetrics and Gynecology | Admitting: Obstetrics and Gynecology

## 2015-02-05 ENCOUNTER — Encounter (HOSPITAL_COMMUNITY)
Admission: RE | Disposition: A | Discharge status: Home / Self Care | Payer: Self-pay | Source: Ambulatory Visit | Attending: Obstetrics and Gynecology

## 2015-02-05 DIAGNOSIS — N882 Stricture and stenosis of cervix uteri: Secondary | ICD-10-CM | POA: Insufficient documentation

## 2015-02-05 DIAGNOSIS — K219 Gastro-esophageal reflux disease without esophagitis: Secondary | ICD-10-CM | POA: Insufficient documentation

## 2015-02-05 DIAGNOSIS — S3769XS Other injury of uterus, sequela: Secondary | ICD-10-CM

## 2015-02-05 DIAGNOSIS — I10 Essential (primary) hypertension: Secondary | ICD-10-CM | POA: Insufficient documentation

## 2015-02-05 DIAGNOSIS — G894 Chronic pain syndrome: Secondary | ICD-10-CM | POA: Insufficient documentation

## 2015-02-05 DIAGNOSIS — I471 Supraventricular tachycardia: Secondary | ICD-10-CM | POA: Diagnosis not present

## 2015-02-05 DIAGNOSIS — Z8041 Family history of malignant neoplasm of ovary: Secondary | ICD-10-CM | POA: Diagnosis not present

## 2015-02-05 DIAGNOSIS — K5900 Constipation, unspecified: Secondary | ICD-10-CM | POA: Diagnosis not present

## 2015-02-05 DIAGNOSIS — F419 Anxiety disorder, unspecified: Secondary | ICD-10-CM | POA: Insufficient documentation

## 2015-02-05 DIAGNOSIS — Z91048 Other nonmedicinal substance allergy status: Secondary | ICD-10-CM | POA: Insufficient documentation

## 2015-02-05 DIAGNOSIS — R1310 Dysphagia, unspecified: Secondary | ICD-10-CM | POA: Insufficient documentation

## 2015-02-05 DIAGNOSIS — K589 Irritable bowel syndrome without diarrhea: Secondary | ICD-10-CM | POA: Insufficient documentation

## 2015-02-05 DIAGNOSIS — Z96652 Presence of left artificial knee joint: Secondary | ICD-10-CM | POA: Diagnosis not present

## 2015-02-05 DIAGNOSIS — Z888 Allergy status to other drugs, medicaments and biological substances status: Secondary | ICD-10-CM | POA: Insufficient documentation

## 2015-02-05 DIAGNOSIS — M1990 Unspecified osteoarthritis, unspecified site: Secondary | ICD-10-CM | POA: Insufficient documentation

## 2015-02-05 DIAGNOSIS — N8110 Cystocele, unspecified: Secondary | ICD-10-CM | POA: Insufficient documentation

## 2015-02-05 DIAGNOSIS — Z9861 Coronary angioplasty status: Secondary | ICD-10-CM | POA: Insufficient documentation

## 2015-02-05 DIAGNOSIS — E119 Type 2 diabetes mellitus without complications: Secondary | ICD-10-CM | POA: Insufficient documentation

## 2015-02-05 DIAGNOSIS — Z823 Family history of stroke: Secondary | ICD-10-CM | POA: Diagnosis not present

## 2015-02-05 DIAGNOSIS — M797 Fibromyalgia: Secondary | ICD-10-CM | POA: Insufficient documentation

## 2015-02-05 DIAGNOSIS — Z79899 Other long term (current) drug therapy: Secondary | ICD-10-CM | POA: Insufficient documentation

## 2015-02-05 DIAGNOSIS — Z806 Family history of leukemia: Secondary | ICD-10-CM | POA: Diagnosis not present

## 2015-02-05 DIAGNOSIS — Z825 Family history of asthma and other chronic lower respiratory diseases: Secondary | ICD-10-CM | POA: Diagnosis not present

## 2015-02-05 DIAGNOSIS — Z8541 Personal history of malignant neoplasm of cervix uteri: Secondary | ICD-10-CM | POA: Insufficient documentation

## 2015-02-05 DIAGNOSIS — Z8249 Family history of ischemic heart disease and other diseases of the circulatory system: Secondary | ICD-10-CM | POA: Diagnosis not present

## 2015-02-05 DIAGNOSIS — Z9049 Acquired absence of other specified parts of digestive tract: Secondary | ICD-10-CM | POA: Diagnosis not present

## 2015-02-05 DIAGNOSIS — Z833 Family history of diabetes mellitus: Secondary | ICD-10-CM | POA: Insufficient documentation

## 2015-02-05 DIAGNOSIS — Z7982 Long term (current) use of aspirin: Secondary | ICD-10-CM | POA: Insufficient documentation

## 2015-02-05 DIAGNOSIS — G629 Polyneuropathy, unspecified: Secondary | ICD-10-CM | POA: Insufficient documentation

## 2015-02-05 DIAGNOSIS — N858 Other specified noninflammatory disorders of uterus: Secondary | ICD-10-CM | POA: Diagnosis not present

## 2015-02-05 DIAGNOSIS — N950 Postmenopausal bleeding: Secondary | ICD-10-CM | POA: Insufficient documentation

## 2015-02-05 DIAGNOSIS — M810 Age-related osteoporosis without current pathological fracture: Secondary | ICD-10-CM | POA: Diagnosis not present

## 2015-02-05 DIAGNOSIS — R0789 Other chest pain: Secondary | ICD-10-CM | POA: Insufficient documentation

## 2015-02-05 DIAGNOSIS — K5790 Diverticulosis of intestine, part unspecified, without perforation or abscess without bleeding: Secondary | ICD-10-CM | POA: Diagnosis not present

## 2015-02-05 DIAGNOSIS — S3769XA Other injury of uterus, initial encounter: Secondary | ICD-10-CM

## 2015-02-05 HISTORY — PX: HYSTEROSCOPY WITH D & C: SHX1775

## 2015-02-05 SURGERY — DILATATION AND CURETTAGE /HYSTEROSCOPY
Anesthesia: Spinal

## 2015-02-05 MED ORDER — MORPHINE SULFATE 0.5 MG/ML IJ SOLN
INTRAMUSCULAR | Status: AC
  Filled 2015-02-05: qty 100

## 2015-02-05 MED ORDER — OXYTOCIN 10 UNIT/ML IJ SOLN
INTRAMUSCULAR | Status: AC
  Filled 2015-02-05: qty 4

## 2015-02-05 MED ORDER — ACETAMINOPHEN 160 MG/5ML PO SOLN
ORAL | Status: AC
  Filled 2015-02-05: qty 20.3

## 2015-02-05 MED ORDER — CEFAZOLIN SODIUM-DEXTROSE 2-3 GM-% IV SOLR
INTRAVENOUS | Status: AC
  Filled 2015-02-05: qty 50

## 2015-02-05 MED ORDER — LACTATED RINGERS IV SOLN: INTRAVENOUS | Status: DC

## 2015-02-05 MED ORDER — FENTANYL CITRATE (PF) 100 MCG/2ML IJ SOLN: 25.0000 ug | INTRAMUSCULAR | Status: DC | PRN

## 2015-02-05 MED ORDER — EPHEDRINE 5 MG/ML INJ
INTRAVENOUS | Status: AC
  Filled 2015-02-05: qty 10

## 2015-02-05 MED ORDER — PHENYLEPHRINE HCL 10 MG/ML IJ SOLN
INTRAMUSCULAR | Status: DC | PRN
  Administered 2015-02-05 (×5): 40 ug via INTRAVENOUS

## 2015-02-05 MED ORDER — MIDAZOLAM HCL 2 MG/2ML IJ SOLN
INTRAMUSCULAR | Status: AC
  Filled 2015-02-05: qty 4

## 2015-02-05 MED ORDER — LACTATED RINGERS IV SOLN
INTRAVENOUS | Status: DC
  Administered 2015-02-05: 12:00:00 via INTRAVENOUS

## 2015-02-05 MED ORDER — GLYCINE 1.5 % IR SOLN
Status: DC | PRN
  Administered 2015-02-05: 3000 mL

## 2015-02-05 MED ORDER — ACETAMINOPHEN 160 MG/5ML PO SOLN
650.0000 mg | Freq: Once | ORAL | Status: AC
  Administered 2015-02-05: 650 mg via ORAL

## 2015-02-05 MED ORDER — MIDAZOLAM HCL 5 MG/5ML IJ SOLN
INTRAMUSCULAR | Status: DC | PRN
  Administered 2015-02-05 (×3): 1 mg via INTRAVENOUS

## 2015-02-05 MED ORDER — IBUPROFEN 600 MG PO TABS: 600.0000 mg | ORAL_TABLET | Freq: Four times a day (QID) | ORAL | Status: DC | PRN

## 2015-02-05 MED ORDER — HYDROCODONE-ACETAMINOPHEN 5-325 MG PO TABS: ORAL_TABLET | ORAL | Status: DC

## 2015-02-05 MED ORDER — FENTANYL CITRATE (PF) 100 MCG/2ML IJ SOLN
INTRAMUSCULAR | Status: AC
  Filled 2015-02-05: qty 4

## 2015-02-05 MED ORDER — PHENYLEPHRINE 40 MCG/ML (10ML) SYRINGE FOR IV PUSH (FOR BLOOD PRESSURE SUPPORT)
PREFILLED_SYRINGE | INTRAVENOUS | Status: AC
  Filled 2015-02-05: qty 10

## 2015-02-05 MED ORDER — FENTANYL CITRATE (PF) 100 MCG/2ML IJ SOLN
INTRAMUSCULAR | Status: DC | PRN
  Administered 2015-02-05: 50 ug via INTRAVENOUS

## 2015-02-05 MED ORDER — LIDOCAINE HCL 1 % IJ SOLN
INTRAMUSCULAR | Status: AC
  Filled 2015-02-05: qty 20

## 2015-02-05 MED ORDER — PHENYLEPHRINE HCL 10 MG/ML IJ SOLN
INTRAMUSCULAR | Status: AC
  Filled 2015-02-05: qty 1

## 2015-02-05 MED ORDER — ONDANSETRON HCL 4 MG/2ML IJ SOLN
INTRAMUSCULAR | Status: AC
Start: 2015-02-05 — End: 2015-02-05
  Filled 2015-02-05: qty 2

## 2015-02-05 SURGICAL SUPPLY — 16 items
ABLATOR ENDOMETRIAL BIPOLAR (ABLATOR) IMPLANT
CANISTER SUCT 3000ML (MISCELLANEOUS) ×3 IMPLANT
CATH ROBINSON RED A/P 16FR (CATHETERS) ×3 IMPLANT
CLOTH BEACON ORANGE TIMEOUT ST (SAFETY) ×3 IMPLANT
CONTAINER PREFILL 10% NBF 60ML (FORM) ×6 IMPLANT
DILATOR CANAL MILEX (MISCELLANEOUS) IMPLANT
ELECT REM PT RETURN 9FT ADLT (ELECTROSURGICAL)
ELECTRODE REM PT RTRN 9FT ADLT (ELECTROSURGICAL) IMPLANT
GLOVE BIO SURGEON STRL SZ7 (GLOVE) ×3 IMPLANT
GOWN STRL REUS W/TWL LRG LVL3 (GOWN DISPOSABLE) ×6 IMPLANT
PACK VAGINAL MINOR WOMEN LF (CUSTOM PROCEDURE TRAY) ×3 IMPLANT
PAD OB MATERNITY 4.3X12.25 (PERSONAL CARE ITEMS) ×3 IMPLANT
TOWEL OR 17X24 6PK STRL BLUE (TOWEL DISPOSABLE) ×6 IMPLANT
TUBING AQUILEX INFLOW (TUBING) ×3 IMPLANT
TUBING AQUILEX OUTFLOW (TUBING) ×3 IMPLANT
WATER STERILE IRR 1000ML POUR (IV SOLUTION) ×3 IMPLANT

## 2015-02-05 NOTE — Transfer of Care (Signed)
Immediate Anesthesia Transfer of Care Note  Patient: Renee [name]  Procedure(s) Performed: Procedure(s): DILATATION AND CURETTAGE /HYSTEROSCOPY (N/A)  Patient Location: PACU  Anesthesia Type:Spinal  Level of Consciousness: awake, alert  and oriented  Airway & Oxygen Therapy: Patient Spontanous Breathing and Patient connected to nasal cannula oxygen  Post-op Assessment: Report given to RN and Post -op Vital signs reviewed and stable  Post vital signs: Reviewed and stable  Last Vitals:  Filed Vitals:   02/05/15 1054  BP: 137/83  Pulse: 55  Temp: 36.7 C  Resp: 18    Complications: No apparent anesthesia complications

## 2015-02-05 NOTE — Discharge Instructions (Signed)
DISCHARGE INSTRUCTIONS: D&C / D&E °The following instructions have been prepared to help you care for yourself upon your return home. °  °Personal hygiene: °• Use sanitary pads for vaginal drainage, not tampons. °• Shower the day after your procedure. °• NO tub baths, pools or Jacuzzis for 2-3 weeks. °• Wipe front to back after using the bathroom. ° °Activity and limitations: °• Do NOT drive or operate any equipment for 24 hours. The effects of anesthesia are still present and drowsiness may result. °• Do NOT rest in bed all day. °• Walking is encouraged. °• Walk up and down stairs slowly. °• You may resume your normal activity in one to two days or as indicated by your physician. ° °Sexual activity: NO intercourse for at least 2 weeks after the procedure, or as indicated by your physician. ° °Diet: Eat a light meal as desired this evening. You may resume your usual diet tomorrow. ° °Return to work: You may resume your work activities in one to two days or as indicated by your doctor. ° °What to expect after your surgery: Expect to have vaginal bleeding/discharge for 2-3 days and spotting for up to 10 days. It is not unusual to have soreness for up to 1-2 weeks. You may have a slight burning sensation when you urinate for the first day. Mild cramps may continue for a couple of days. You may have a regular period in 2-6 weeks. ° °Call your doctor for any of the following: °• Excessive vaginal bleeding, saturating and changing one pad every hour. °• Inability to urinate 6 hours after discharge from hospital. °• Pain not relieved by pain medication. °• Fever of 100.4° F or greater. °• Unusual vaginal discharge or odor. ° ° Call for an appointment:  ° ° °Patient’s signature: ______________________ ° °Nurse’s signature ________________________ ° °Support person's signature_______________________ ° ° ° °Post Anesthesia Home Care Instructions ° °Activity: °Get plenty of rest for the remainder of the day. A responsible  adult should stay with you for 24 hours following the procedure.  °For the next 24 hours, DO NOT: °-Drive a car °-Operate machinery °-Drink alcoholic beverages °-Take any medication unless instructed by your physician °-Make any legal decisions or sign important papers. ° °Meals: °Start with liquid foods such as gelatin or soup. Progress to regular foods as tolerated. Avoid greasy, spicy, heavy foods. If nausea and/or vomiting occur, drink only clear liquids until the nausea and/or vomiting subsides. Call your physician if vomiting continues. ° °Special Instructions/Symptoms: °Your throat may feel dry or sore from the anesthesia or the breathing tube placed in your throat during surgery. If this causes discomfort, gargle with warm salt water. The discomfort should disappear within 24 hours. ° °If you had a scopolamine patch placed behind your ear for the management of post- operative nausea and/or vomiting: ° °1. The medication in the patch is effective for 72 hours, after which it should be removed.  Wrap patch in a tissue and discard in the trash. Wash hands thoroughly with soap and water. °2. You may remove the patch earlier than 72 hours if you experience unpleasant side effects which may include dry mouth, dizziness or visual disturbances. °3. Avoid touching the patch. Wash your hands with soap and water after contact with the patch. °  ° °

## 2015-02-05 NOTE — Op Note (Signed)
Pre-Operative Diagnosis: 1) postmenopausal bleeding Postoperative diagnoses: 1) postmenopausal bleeding 2) Cervical stenosis 3) uterine perforation Procedure: Hysteroscopy, dilation and curettage under direct ultrasound guidance, and cervical biopsies Surgeon: Dr. Vanessa Kick Asst.: None Operative findings: small anteverted uterus, anterior cyctocele, small posterior rectocele. Cervical stenosis Specimen: cervical biopsies and endometrial curettings EBL: minimal  Procedure: Ms. Korus is a 69 year old Caucasian female with a two year history of postmenopausal bleeding. Please refer to the history and physical for further details of her history. Given the patient's extensive cardiac history the decision was made to perform the procedure under spinal anesthesia. Following the appropriate informed consent the patient was taken to the operating room where spinal anesthesia was administered and found to be adequate. She was placed in the dorsal lithotomy position in [place].  The perineum was prepped and draped in the normal sterile fashion. A speculum was placed into the vagina and the anterior lip of the cervix was crossed with a single toothed tenaculum.  A size 9 Hank dilator was used to dilate the cervix.  The initial Passage of the first hank dilator was difficult due to cervical stenosis.  Gentle downward traction was placed on the single toothed tenaculum  And the size 9 Hank dilator was passed to the hub of the dilator.  The cervix was then serially dilated up to a size 15/16 dilator.  At this time the hysteroscope was passed through the cervix, however visualization was not adequate due to debris on the hysteroscope. The scope was removed and a new hysteroscope was obtained.  While waiting for the new equipment cervical biopsies of all for cervix quadrants were obtained.  Once the new history scope was available the scope was then  Introduced into the cervix with passage through the internal  cervical os fat was visualized  And the diagnosis of uterine perforation was made. The history scope was removed.  Approximately 300 to 400 mL of glycine distention media extravasated Into the abdomen through the uterine perforation. At this time and ultrasound was called for.  The remainder of the procedure was performed under direct ultrasound guidance. Scant tissue on endometrial curettings were obtained. This completed the procedure and the patient was transferred to the PACU in stable condition. No significant vaginal bleeding was present at the completion of the procedure.

## 2015-02-05 NOTE — H&P (Signed)
Renee [name] is an 69 y.o. female.   69 yo Caucasian female presents for evaluation of postmenopausal bleeding.  The patient has had at least a 2 year history of postmenopausal bleeding. She has previously had a endometrial biopsy that was benign but the PMB has persisted. Her last pelvic ultrasound was non-diagnostic due to intrauterine fibroids. She was recommended to have a hysteroscopy d&C previously but due to her cardiac issues this was postponed. She also has a history of cervical cancer and is s/p cone biopsy about 20 years ago. Recent pap smears have been negative. She presents today for hysteroscopy, dilation and curettage and cervical biopsies. R/B/A reviewed. Informed consent has been obtained  Menstrual History: No LMP recorded. Patient is postmenopausal.    Past Medical History  Diagnosis Date  . Bronchitis     Recurrent  . Hypertension   . Chronic chest pain     a. 2005 Cath: Mild nonobstructive plaque (30-40% LCX);  b. 08/2011 low risk myoview;  c. 09/2011 Coronary CT: NL cors w ca score of 0.  . AV nodal re-entry tachycardia     a. s/p RFA 1993- Dr. Rolland Porter, [place]  . Long QT interval     a. mild - advised to avoid meds that may prolong QT.  Marland Kitchen GERD (gastroesophageal reflux disease)   . Dysphagia   . Duodenitis without hemorrhage   . Diverticulosis of colon   . Irritable bowel syndrome   . Constipation, chronic   . Hemorrhoids   . Fecal incontinence   . Thyroid nodule   . Degenerative joint disease     a. s/p L Total Knee Arthroplasty.  . Fibromyalgia   . Chronic pain syndrome   . Neuropathy   . Anxiety   . Pre-syncope   . Cancer     hx of cervical   ca  . SVT (supraventricular tachycardia)     a. nonsustained SVT - previously offered flecainide but refused;  b. 12/2012 30 day event monitor w/o significant arrhythmias.  . Osteoporosis   . Concussion     1979, 1993  . Broken neck     1993  . Diet-controlled type 2 diabetes mellitus   . Complication of  anesthesia     blood pressure and heart dropped in 2009    . PONV (postoperative nausea and vomiting)     Past Surgical History  Procedure Laterality Date  . Biopsy thyroid    . Total hip arthroplasty  10/10    Left, at [facility]  . Hysteroscopy  2002    with resection of endometrial polyps. by Dr.MCPhail  . Coronary angioplasty  2007  . Cardiac electrophysiology study and ablation      atrioventricular nodal reentant tachycardia  . Ablation of dysrhythmic focus  1993  . Cholecystectomy  1980  . Orif radial head / neck fracture  09/05/88  . Colonoscopy  06/25/2008    Diverticulosis and Hemorrhoids  . Esophagogastroduodenoscopy    . Loop recorder implant  08/21/2013    MDT LinQ implanted by Dr Lovena Le for syncope  . Tilt table study N/A 05/15/2013    Procedure: TILT TABLE STUDY;  Surgeon: Deboraha Sprang, MD;  Location: [facility] CATH LAB;  Service: Cardiovascular;  Laterality: N/A;  . Loop recorder implant N/A 08/21/2013    Procedure: LOOP RECORDER IMPLANT;  Surgeon: Evans Lance, MD;  Location: [facility] CATH LAB;  Service: Cardiovascular;  Laterality: N/A;    Family History  Problem Relation Age of Onset  .  Ovarian cancer Mother   . Stroke Mother   . Diabetes Mother   . Asthma Mother   . Diabetes Sister     Pacemaker, CHF  . Heart disease Sister   . Asthma Sister   . Lung cancer Father     Heart problems  . Heart disease Sister   . Diabetes Brother     Social History:  reports that she has never smoked. She has never used smokeless tobacco. She reports that she drinks alcohol. She reports that she does not use illicit drugs.  Allergies:  Allergies  Allergen Reactions  . Cyclobenzaprine Other (See Comments)    Pt states that this medication does not work for her.    . Adhesive [Tape] Itching, Rash and Other (See Comments)    Reaction:  Blisters  Pt states that she is allergic to the adhesive backing of EKG pads.     . Moxifloxacin Other (See Comments)    Reaction:   Unknown   . Quinolones Other (See Comments)    Reaction:  Unknown     Prescriptions prior to admission  Medication Sig Dispense Refill Last Dose  . ALPRAZolam (XANAX) 0.5 MG tablet Take 0.5-1 tablets (0.25-0.5 mg total) by mouth 2 (two) times daily as needed for anxiety. 90 tablet 1 Past Month at Unknown time  . aspirin EC 81 MG tablet Take 81 mg by mouth daily.    02/04/2015 at unknown  . Blood Glucose Monitoring Suppl (ACCU-CHEK NANO SMARTVIEW) W/DEVICE KIT Test once daily as directed (Patient taking differently: as directed. Pt is to use to test once daily.) 1 kit 0 Taking  . diltiazem (CARDIZEM CD) 120 MG 24 hr capsule Take 120 mg by mouth daily.   02/05/2015 at 0800  . flecainide (TAMBOCOR) 50 MG tablet Take 0.5 tablets (25 mg total) by mouth 2 (two) times daily. 10 tablet 0 02/05/2015 at 0800  . glucose blood (ACCU-CHEK SMARTVIEW) test strip Test 1 time daily (Patient taking differently: 1 each by Other route daily. ) 100 each 1 Taking  . naproxen sodium (ANAPROX) 220 MG tablet Take 440 mg by mouth 2 (two) times daily as needed (for pain).    Taking  . omeprazole (PRILOSEC) 40 MG capsule Take 40 mg by mouth every evening.   02/04/2015 at unknown  . potassium chloride (MICRO-K) 10 MEQ CR capsule Take 1 capsule (10 mEq total) by mouth daily. 30 capsule 3 02/04/2015 at unknown  . triamterene-hydrochlorothiazide (DYAZIDE) 37.5-25 MG per capsule Take 1 each (1 capsule total) by mouth daily. 90 capsule 2 02/05/2015 at 0800  . nitroGLYCERIN (NITROSTAT) 0.4 MG SL tablet Place 1 tablet (0.4 mg total) under the tongue every 5 (five) minutes as needed for chest pain. 30 tablet 0   . zolpidem (AMBIEN) 5 MG tablet Take 1 tablet (5 mg total) by mouth at bedtime as needed for sleep. 90 tablet 1 More than a month at Unknown time    ROS  Blood pressure 137/83, pulse 55, temperature 98.1 F (36.7 C), temperature source Oral, resp. rate 18, SpO2 100 %. Physical Exam   AOX3 No extra work of  breathing Abdomen non-tender  No results found for this or any previous visit (from the past 24 hour(s)).  No results found.  Assessment/Plan: 1) Hysteroscopy, D&C, cervical biopsies for PMB  Reyden Smith H. 02/05/2015, 12:02 PM

## 2015-02-05 NOTE — Anesthesia Procedure Notes (Addendum)
Spinal Patient location during procedure: OR Preanesthetic Checklist Completed: patient identified, site marked, surgical consent, pre-op evaluation, timeout performed, IV checked, risks and benefits discussed and monitors and equipment checked Spinal Block Patient position: sitting Prep: DuraPrep Patient monitoring: heart rate, cardiac monitor, continuous pulse ox and blood pressure Approach: midline Location: L3-4 Injection technique: single-shot Needle Needle type: Sprotte  Needle gauge: 24 G Needle length: 9 cm Assessment Sensory level: T10 Additional Notes Spinal Dosage in OR  Bupivicaine ml       1.0

## 2015-02-06 ENCOUNTER — Encounter (HOSPITAL_COMMUNITY): Payer: Self-pay | Admitting: Obstetrics and Gynecology

## 2015-02-09 NOTE — Anesthesia Postprocedure Evaluation (Signed)
  Anesthesia Post-op Note  Patient: Renee [name]  Procedure(s) Performed: Procedure(s): DILATATION AND CURETTAGE /HYSTEROSCOPY (N/A) Patient is awake and responsive. Pain and nausea are reasonably well controlled. Vital signs are stable and clinically acceptable. Oxygen saturation is clinically acceptable. There are no apparent anesthetic complications at this time. Patient is ready for discharge.

## 2015-02-11 ENCOUNTER — Encounter: Payer: Self-pay | Admitting: Internal Medicine

## 2015-02-12 ENCOUNTER — Telehealth: Payer: Self-pay

## 2015-02-12 NOTE — Telephone Encounter (Signed)
Copied from Dr Claris Gladden 11/11/14 office note: Continue the flecainide and diltiazem CD and continue to follow ILR.

## 2015-02-17 ENCOUNTER — Other Ambulatory Visit: Payer: Self-pay

## 2015-02-17 ENCOUNTER — Ambulatory Visit (INDEPENDENT_AMBULATORY_CARE_PROVIDER_SITE_OTHER): Payer: Medicare PPO | Admitting: *Deleted

## 2015-02-17 DIAGNOSIS — R55 Syncope and collapse: Secondary | ICD-10-CM | POA: Diagnosis not present

## 2015-02-17 MED ORDER — DILTIAZEM HCL ER COATED BEADS 120 MG PO CP24: 120.0000 mg | ORAL_CAPSULE | Freq: Every day | ORAL | Status: DC

## 2015-02-17 NOTE — Telephone Encounter (Signed)
Call Lincoln, RN at 02/12/2015 2:59 PM     Status: Signed       Expand All Collapse All   Copied from Dr Claris Gladden 11/11/14 office note: Continue the flecainide and diltiazem CD and continue to follow ILR.

## 2015-02-18 NOTE — Progress Notes (Signed)
Loop recorder 

## 2015-02-20 ENCOUNTER — Other Ambulatory Visit: Payer: Self-pay | Admitting: Internal Medicine

## 2015-02-24 ENCOUNTER — Other Ambulatory Visit: Payer: Self-pay

## 2015-02-24 MED ORDER — FLECAINIDE ACETATE 50 MG PO TABS: 25.0000 mg | ORAL_TABLET | Freq: Two times a day (BID) | ORAL | Status: DC

## 2015-02-28 LAB — CUP PACEART REMOTE DEVICE CHECK: MDC IDC SESS DTM: 20160917154544

## 2015-02-28 NOTE — Progress Notes (Signed)
Carelink summary report received. Battery status OK. Normal device function. No new symptom episodes, tachy episodes, brady, or pause episodes. No new AF episodes. Monthly summary reports and ROV with GT on 09/2015.

## 2015-03-18 ENCOUNTER — Ambulatory Visit (INDEPENDENT_AMBULATORY_CARE_PROVIDER_SITE_OTHER): Payer: Medicare PPO | Admitting: *Deleted

## 2015-03-18 ENCOUNTER — Encounter: Payer: Self-pay | Admitting: Internal Medicine

## 2015-03-18 DIAGNOSIS — R55 Syncope and collapse: Secondary | ICD-10-CM

## 2015-03-20 NOTE — Progress Notes (Signed)
Loop recorder 

## 2015-03-31 LAB — CUP PACEART REMOTE DEVICE CHECK: Date Time Interrogation Session: 20161006000716

## 2015-03-31 NOTE — Progress Notes (Signed)
Carelink summary report received. Battery status OK. Normal device function. No new symptom episodes, tachy episodes, brady, or pause episodes. No new AF episodes. Monthly summary reports and ROV with GT in 09/2015.

## 2015-04-01 ENCOUNTER — Ambulatory Visit (AMBULATORY_SURGERY_CENTER): Payer: Medicare PPO | Admitting: Gastroenterology

## 2015-04-01 ENCOUNTER — Encounter: Payer: Self-pay | Admitting: Gastroenterology

## 2015-04-01 VITALS — BP 131/56 | HR 61 | Temp 98.7°F | Resp 16 | Ht 63.0 in | Wt 150.0 lb

## 2015-04-01 DIAGNOSIS — R195 Other fecal abnormalities: Secondary | ICD-10-CM | POA: Diagnosis not present

## 2015-04-01 DIAGNOSIS — D125 Benign neoplasm of sigmoid colon: Secondary | ICD-10-CM | POA: Diagnosis not present

## 2015-04-01 DIAGNOSIS — K921 Melena: Secondary | ICD-10-CM | POA: Diagnosis present

## 2015-04-01 MED ORDER — SODIUM CHLORIDE 0.9 % IV SOLN: 500.0000 mL | INTRAVENOUS | Status: DC

## 2015-04-01 NOTE — Patient Instructions (Signed)
YOU HAD AN ENDOSCOPIC PROCEDURE TODAY AT THE [facility]:   Refer to the procedure report that was given to you for any specific questions about what was found during the examination.  If the procedure report does not answer your questions, please call your gastroenterologist to clarify.  If you requested that your care partner not be given the details of your procedure findings, then the procedure report has been included in a sealed envelope for you to review at your convenience later.  YOU SHOULD EXPECT: Some feelings of bloating in the abdomen. Passage of more gas than usual.  Walking can help get rid of the air that was put into your GI tract during the procedure and reduce the bloating. If you had a lower endoscopy (such as a colonoscopy or flexible sigmoidoscopy) you may notice spotting of blood in your stool or on the toilet paper. If you underwent a bowel prep for your procedure, you may not have a normal bowel movement for a few days.  Please Note:  You might notice some irritation and congestion in your nose or some drainage.  This is from the oxygen used during your procedure.  There is no need for concern and it should clear up in a day or so.  SYMPTOMS TO REPORT IMMEDIATELY:   Following lower endoscopy (colonoscopy or flexible sigmoidoscopy):  Excessive amounts of blood in the stool  Significant tenderness or worsening of abdominal pains  Swelling of the abdomen that is new, acute  Fever of 100F or higher   For urgent or emergent issues, a gastroenterologist can be reached at any hour by calling [phone].   DIET: Your first meal following the procedure should be a small meal and then it is ok to progress to your normal diet. Heavy or fried foods are harder to digest and may make you feel nauseous or bloated.  Likewise, meals heavy in dairy and vegetables can increase bloating.  Drink plenty of fluids but you should avoid alcoholic beverages for 24  hours.  ACTIVITY:  You should plan to take it easy for the rest of today and you should NOT DRIVE or use heavy machinery until tomorrow (because of the sedation medicines used during the test).    FOLLOW UP: Our staff will call the number listed on your records the next business day following your procedure to check on you and address any questions or concerns that you may have regarding the information given to you following your procedure. If we do not reach you, we will leave a message.  However, if you are feeling well and you are not experiencing any problems, there is no need to return our call.  We will assume that you have returned to your regular daily activities without incident.  If any biopsies were taken you will be contacted by phone or by letter within the next 1-3 weeks.  Please call us at [phone] if you have not heard about the biopsies in 3 weeks.    SIGNATURES/CONFIDENTIALITY: You and/or your care partner have signed paperwork which will be entered into your electronic medical record.  These signatures attest to the fact that that the information above on your After Visit Summary has been reviewed and is understood.  Full responsibility of the confidentiality of this discharge information lies with you and/or your care-partner.   Resume medications. Information given on polyps,hemorrhoids and high fiber diet. 

## 2015-04-01 NOTE — Progress Notes (Signed)
Report to PACU, RN, vss, BBS= Clear.  

## 2015-04-01 NOTE — Progress Notes (Signed)
Called to room to assist during endoscopic procedure.  Patient ID and intended procedure confirmed with present staff. Received instructions for my participation in the procedure from the performing physician.  

## 2015-04-01 NOTE — Op Note (Signed)
[place]  [address] [name], [id number]   COLONOSCOPY PROCEDURE REPORT  PATIENT: Renee [name], Renee [name]  MR#: [id number] BIRTHDATE: 04/05/1946 , 43  yrs. old GENDER: female ENDOSCOPIST: Ladene Artist, MD, [facility] PROCEDURE DATE:  04/01/2015 PROCEDURE:   Colonoscopy, diagnostic and Colonoscopy with snare polypectomy First Screening Colonoscopy - Avg.  risk and is 50 yrs.  old or older - No.  Prior Negative Screening - Now for repeat screening. N/A  History of Adenoma - Now for follow-up colonoscopy & has been > or = to 3 yrs.  N/A  Polyps removed today? Yes ASA CLASS:   Class II INDICATIONS:Evaluation of unexplained GI bleeding. MEDICATIONS: Monitored anesthesia care and Propofol 200 mg IV DESCRIPTION OF PROCEDURE:   After the risks benefits and alternatives of the procedure were thoroughly explained, informed consent was obtained.  The digital rectal exam revealed no abnormalities of the rectum.   The LB [id number]  endoscope was introduced through the anus and advanced to the cecum, which was identified by both the appendix and ileocecal valve. No adverse events experienced.   The quality of the prep was good.  (MiraLax was used)  The instrument was then slowly withdrawn as the colon was fully examined. Estimated blood loss is zero unless otherwise noted in this procedure report.    COLON FINDINGS: Two sessile polyps measuring 6 mm in size were found in the sigmoid colon.  Polypectomies were performed with a cold snare.  The resection was complete, the polyp tissue was completely retrieved and sent to histology.   The examination was otherwise normal.  Retroflexed views revealed internal Grade II hemorrhoids. The time to cecum = 3.6 Withdrawal time = 11.9   The scope was withdrawn and the procedure completed. COMPLICATIONS: There were no immediate complications.  ENDOSCOPIC IMPRESSION: 1.   Two sessile polyps in the sigmoid colon;  polypectomies performed with a cold snare 2.   Grade Il internal hemorrhoids  RECOMMENDATIONS: 1.  Await pathology results 2.  Repeat colonoscopy in 5 years if polyp(s) adenomatous; otherwise 10 years 3.  Surgical referral, Dr. Marcello Moores, for hemorrhoid mgmt  eSigned:  Ladene Artist, MD, [facility] 04/01/2015 10:18 AM

## 2015-04-02 ENCOUNTER — Telehealth: Payer: Self-pay

## 2015-04-02 NOTE — Telephone Encounter (Signed)
  Follow up Call-  Call back number 04/01/2015  Post procedure Call Back phone  # [phone]  Permission to leave phone message Yes     Patient questions:  Do you have a fever, pain , or abdominal swelling? No. Pain Score  0 *  Have you tolerated food without any problems? Yes.    Have you been able to return to your normal activities? Yes.    Do you have any questions about your discharge instructions: Diet   No. Medications  No. Follow up visit  No.  Do you have questions or concerns about your Care? Yes.    Actions: * If pain score is 4 or above: No action needed, pain <4.  Pt said she did see some reddish colored blood from the rectum yesterday.  I advised her if bleeding does not lessen and stop within a couple days to call Dr. Fuller Plan back. Pt she she would. maw

## 2015-04-03 ENCOUNTER — Telehealth: Payer: Self-pay

## 2015-04-03 NOTE — Telephone Encounter (Signed)
I have faxed referral and left a message for Janett Billow at [facility].  Per procedure report on 04/01/15 patient needs referral for hemorrhoids to Dr. Marcello Moores

## 2015-04-06 NOTE — Telephone Encounter (Signed)
Patient is scheduled to see Dr. Marcello Moores with [facility] for 04/21/15 arrive at 10:00 for 10:30 appt.  Left message for patient to call back

## 2015-04-06 NOTE — Telephone Encounter (Signed)
Patient notified of the appt date and time 

## 2015-04-08 ENCOUNTER — Encounter: Payer: Self-pay | Admitting: Gastroenterology

## 2015-04-15 ENCOUNTER — Encounter: Payer: Self-pay | Admitting: Internal Medicine

## 2015-04-17 ENCOUNTER — Ambulatory Visit (INDEPENDENT_AMBULATORY_CARE_PROVIDER_SITE_OTHER): Payer: Medicare PPO | Admitting: *Deleted

## 2015-04-17 DIAGNOSIS — R55 Syncope and collapse: Secondary | ICD-10-CM

## 2015-04-20 NOTE — Progress Notes (Signed)
Carelink Summary Report / Loop recorder 

## 2015-04-23 ENCOUNTER — Other Ambulatory Visit: Payer: Self-pay | Admitting: Cardiology

## 2015-04-23 ENCOUNTER — Other Ambulatory Visit: Payer: Self-pay

## 2015-04-23 MED ORDER — TRIAMTERENE-HCTZ 37.5-25 MG PO CAPS: 1.0000 | ORAL_CAPSULE | Freq: Every day | ORAL | Status: DC

## 2015-04-23 MED ORDER — FLECAINIDE ACETATE 50 MG PO TABS: 25.0000 mg | ORAL_TABLET | Freq: Two times a day (BID) | ORAL | Status: DC

## 2015-04-30 ENCOUNTER — Ambulatory Visit (INDEPENDENT_AMBULATORY_CARE_PROVIDER_SITE_OTHER): Payer: Medicare PPO

## 2015-04-30 DIAGNOSIS — Z23 Encounter for immunization: Secondary | ICD-10-CM | POA: Diagnosis not present

## 2015-05-13 ENCOUNTER — Telehealth: Payer: Self-pay | Admitting: Gastroenterology

## 2015-05-13 MED ORDER — DICYCLOMINE HCL 20 MG PO TABS: 20.0000 mg | ORAL_TABLET | Freq: Three times a day (TID) | ORAL | Status: DC

## 2015-05-13 MED ORDER — OMEPRAZOLE 40 MG PO CPDR: 40.0000 mg | DELAYED_RELEASE_CAPSULE | Freq: Every evening | ORAL | Status: DC

## 2015-05-13 NOTE — Telephone Encounter (Signed)
Sent both prescriptions to [facility] mail order pharmacy.

## 2015-05-13 NOTE — Telephone Encounter (Signed)
She had OV with me in 01/2015 so we can refill both meds for 1 year. REV in 1 year.

## 2015-05-13 NOTE — Telephone Encounter (Signed)
I informed patient that I will have to ask Dr. Fuller Plan if he will refill dicyclomine and omeprazole because he is not the original prescriber on these medications and has never evaluated and treated her for these issues. I told patient that Dr. Fuller Plan has only seen her for a Colonoscopy and has not been refilling these medications. Patient states she was diagnosed years ago with GERD by Dr. Sharlett Iles and she does not feel like she should have to come in and see Dr. Fuller Plan. I informed patient that I just have to ask him and she may have to come in for an office visit. She asked patient why she could not get the medication refilled through her ENT or PCP. Pt states her ENT she does not see very often and her PCP has retired also. Dr. Fuller Plan will your prescribe omeprazole and dicyclomine?

## 2015-05-13 NOTE — Telephone Encounter (Signed)
Patient states that if we refill these medications they would need to be called in. [phone]

## 2015-05-13 NOTE — Telephone Encounter (Signed)
patient states that Dr. Wilburn Cornelia ENT had prescribed omeprazole for GERD. she states that she is now out and Dr. Wilburn Cornelia will not fill this rx. she states that she doesn't see him very often. She is wanting to know if Dr. Fuller Plan will fill this for her. [facility] mail order.   Patient states that Dr. Sharlett Iles had prescribed dicyclomine. She is requesting this as well.

## 2015-05-17 LAB — CUP PACEART REMOTE DEVICE CHECK: Date Time Interrogation Session: 20161105003713

## 2015-05-17 NOTE — Progress Notes (Signed)
Carelink summary report received. Battery status OK. Normal device function. No new symptom episodes, tachy episodes, brady, or pause episodes. No new AF episodes. Monthly summary reports and ROV with GT in 09/2015. 

## 2015-05-18 ENCOUNTER — Ambulatory Visit (INDEPENDENT_AMBULATORY_CARE_PROVIDER_SITE_OTHER): Payer: Medicare PPO | Admitting: *Deleted

## 2015-05-18 DIAGNOSIS — R55 Syncope and collapse: Secondary | ICD-10-CM

## 2015-05-18 NOTE — Progress Notes (Signed)
Carelink Summary Report / Loop Recorder 

## 2015-05-21 ENCOUNTER — Other Ambulatory Visit: Payer: Self-pay | Admitting: Pulmonary Disease

## 2015-06-16 ENCOUNTER — Ambulatory Visit (INDEPENDENT_AMBULATORY_CARE_PROVIDER_SITE_OTHER): Payer: Medicare PPO | Admitting: *Deleted

## 2015-06-16 DIAGNOSIS — R55 Syncope and collapse: Secondary | ICD-10-CM

## 2015-06-17 NOTE — Progress Notes (Signed)
Carelink Summary Report / Loop Recorder 

## 2015-07-14 LAB — CUP PACEART REMOTE DEVICE CHECK: Date Time Interrogation Session: 20161205010547

## 2015-07-16 ENCOUNTER — Ambulatory Visit (INDEPENDENT_AMBULATORY_CARE_PROVIDER_SITE_OTHER): Payer: Medicare PPO | Admitting: *Deleted

## 2015-07-16 DIAGNOSIS — R55 Syncope and collapse: Secondary | ICD-10-CM | POA: Diagnosis not present

## 2015-07-20 NOTE — Progress Notes (Signed)
Carelink Summary Report / Loop Recorder 

## 2015-07-22 ENCOUNTER — Telehealth: Payer: Self-pay | Admitting: *Deleted

## 2015-07-22 LAB — CUP PACEART REMOTE DEVICE CHECK: Date Time Interrogation Session: 20170104013741

## 2015-07-22 NOTE — Telephone Encounter (Signed)
Called patient to discuss tachy episodes on 05/20/15.  Patient states she is fatigued "all the time" and frequently feels symptomatic, but was asymptomatic on 05/20/15.  When asked about specific symptoms, patient states her heart "stops" and makes her feel bad, and that her heart rhythm feels irregular.  Advised patient that no pause or brady episodes have been detected by her loop recorder recently.  Also advised her to use symptom activator to mark symptom episodes.  She is concerned that her Carelink monitor is not transmitting, advised that it is up to date for nightly transmissions.  Patient verbalizes understanding.  Patient states that on 07/15/15 she went to a basketball game.  She states that suddenly she felt like someone hit her in the back "with a sledge hammer".  She took 2 nitro SL as she felt "deathly sick" and someone "called some nurses over".  Patient reports that by the time the nurses checked her B/P, it was normal and her symptoms had resolved.  Her daughter drove her home and she did not seek emergency care as she only had to take 2 nitro SL, but that she would have gone to the ED if she had to take 3 nitro.  Patient would like to know if there was anything abnormal on her transmissions from this date.  Advised patient that no alerts were triggered in regards to her heart rhythm, but that the loop recorder does not monitor blood pressures or angina.  Patient verbalizes understanding.  Patient is due to see Dr. Lovena Le in 09/2015 for a 1 year follow-up.  She requests an earlier appointment, scheduled for 08/25/15 at 3:00pm.  Patient is appreciative of call and denies additional questions or concerns at this time.  She is aware to call with worsening symptoms or questions.  Routed to Dr. Lovena Le for review.

## 2015-07-24 ENCOUNTER — Other Ambulatory Visit: Payer: Self-pay | Admitting: Cardiology

## 2015-07-24 NOTE — Telephone Encounter (Signed)
New message      *STAT* If patient is at the pharmacy, call can be transferred to refill team.   1. Which medications need to be refilled? (please list name of each medication and dose if known) triamterene-HCTZ 37.5-25 2. Which pharmacy/location (including street and city if local pharmacy) is medication to be sent to?[facility] 3. Do they need a 30 day or 90 day supply? 90 day

## 2015-07-27 ENCOUNTER — Telehealth: Payer: Self-pay | Admitting: Internal Medicine

## 2015-07-27 MED ORDER — POTASSIUM CHLORIDE ER 10 MEQ PO CPCR: 10.0000 meq | ORAL_CAPSULE | Freq: Every day | ORAL | Status: DC

## 2015-07-27 NOTE — Telephone Encounter (Signed)
Patient called to request that we fill   potassium chloride (MICRO-K) 10 MEQ CR capsule    To [facility] mail order. appt set for CPE on 09/07/2015

## 2015-08-17 ENCOUNTER — Ambulatory Visit (INDEPENDENT_AMBULATORY_CARE_PROVIDER_SITE_OTHER): Payer: Medicare PPO | Admitting: *Deleted

## 2015-08-17 DIAGNOSIS — R55 Syncope and collapse: Secondary | ICD-10-CM

## 2015-08-18 LAB — CUP PACEART REMOTE DEVICE CHECK: MDC IDC SESS DTM: 20170203020836

## 2015-08-18 NOTE — Progress Notes (Signed)
Carelink summary report received. Battery status OK. Normal device function. No new symptom episodes, tachy episodes, brady, or pause episodes. No new AF episodes. Monthly summary reports and ROV/PRN 

## 2015-08-20 NOTE — Progress Notes (Signed)
Carelink Summary Report / Loop Recorder 

## 2015-08-22 LAB — CUP PACEART REMOTE DEVICE CHECK: Date Time Interrogation Session: 20170307060405

## 2015-08-22 NOTE — Progress Notes (Signed)
Carelink summary report received. Battery status OK. Normal device function. No new symptom episodes, tachy episodes, brady, or pause episodes. No new AF episodes. Monthly summary reports and ROV/PRN 

## 2015-08-25 ENCOUNTER — Ambulatory Visit (INDEPENDENT_AMBULATORY_CARE_PROVIDER_SITE_OTHER): Payer: Medicare PPO | Admitting: Internal Medicine

## 2015-08-25 ENCOUNTER — Other Ambulatory Visit (HOSPITAL_COMMUNITY): Payer: Self-pay | Admitting: Orthopaedic Surgery

## 2015-08-25 ENCOUNTER — Encounter: Payer: Self-pay | Admitting: Internal Medicine

## 2015-08-25 VITALS — BP 136/78 | HR 70 | Ht 63.0 in | Wt 161.2 lb

## 2015-08-25 DIAGNOSIS — M545 Low back pain, unspecified: Secondary | ICD-10-CM

## 2015-08-25 DIAGNOSIS — R55 Syncope and collapse: Secondary | ICD-10-CM | POA: Diagnosis not present

## 2015-08-25 LAB — CUP PACEART INCLINIC DEVICE CHECK: MDC IDC SESS DTM: 20170314161920

## 2015-08-25 MED ORDER — DILTIAZEM HCL ER COATED BEADS 120 MG PO CP24: 120.0000 mg | ORAL_CAPSULE | Freq: Every day | ORAL | Status: DC

## 2015-08-25 NOTE — Patient Instructions (Signed)
Medication Instructions:  Your physician recommends that you continue on your current medications as directed. Please refer to the Current Medication list given to you today.   Labwork: None ordered   Testing/Procedures: None ordered   Follow-Up: Your physician wants you to follow-up in: 12 months with Dr. Taylor.  You will receive a reminder letter in the mail two months in advance. If you don't receive a letter, please call our office to schedule the follow-up appointment.   Any Other Special Instructions Will Be Listed Below (If Applicable).     If you need a refill on your cardiac medications before your next appointment, please call your pharmacy.    
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
## 2015-08-27 ENCOUNTER — Telehealth: Payer: Self-pay | Admitting: Radiology

## 2015-09-01 ENCOUNTER — Telehealth: Payer: Self-pay | Admitting: Internal Medicine

## 2015-09-01 NOTE — Telephone Encounter (Signed)
New message     Patient sent loop recorder reading last night wants to know did it shown anything.

## 2015-09-01 NOTE — Telephone Encounter (Signed)
Patient returned phone call.  She states that last night she tried to lay down to sleep and she felt like her heart was going to "beat out of her chest".  Patient states she recently had an allergic reaction to a chemical and was started on a 3 week prednisone taper by her dermatologist.  Advised patient that prednisone sometimes makes people feel jittery or anxious, but that I will review her manual transmissions with Dr. Lovena Le when he is back in the office tomorrow.  Patient is agreeable to this plan.    Patient is concerned that her loop recorder is not monitored 24/7.  Reviewed purpose of ILR monitoring with patient and advised that it is not intended to monitor for emergency situations and that she will still need to call 911 and proceed to the ED if she has urgently worsening symptoms.  Patient states "that's not what I was told when I got this thing implanted and I'm going to find my paperwork that has all that information on it".  Advised patient that I can provide her with educational material about her ILR, but she declines it at this time.  Patient denies any additional questions or concerns at this time and is aware that I will call her back tomorrow with any additional recommendations that Dr. Lovena Le may have.

## 2015-09-01 NOTE — Telephone Encounter (Signed)
Called and left message for patient to call back. Will send to Dr. Lovena Le for advisement.

## 2015-09-01 NOTE — Telephone Encounter (Signed)
New message    Pt C/O medication issue:  1. Name of Medication: prednisone  40 mg - prescribe [facility]   2. How are you currently taking this medication (dosage and times per day)? Daily for 3 weeks   3. Are you having a reaction (difficulty breathing--STAT)? No due to reaction.    4. What is your medication issue?  Can she take this medication with her heart condition.

## 2015-09-01 NOTE — Telephone Encounter (Signed)
[facility] requesting call back.  Gave device clinic phone number.  Patient had no alerts or episodes on the 5 manual transmissions that she sent last evening.

## 2015-09-02 NOTE — Telephone Encounter (Signed)
Safe to take prednisone per Dr. Lovena Le.  Patient made aware.  See device phone note from 09/01/15.

## 2015-09-02 NOTE — Telephone Encounter (Signed)
Called patient to advise patient that her prednisone is safe to take and that Dr. Lovena Le reviewed her remote transmission and that it showed no abnormalities other than occasional PACs.  Patient verbalizes understanding.  She also states that she "finally remembered" what she was doing on 05/20/15 when she had SVT episodes.  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  She states that Dr. Lovena Le told her to start walking for exercise and that she is planning to start doing this later in the week.  Patient is appreciative of call and denies additional questions at this time.

## 2015-09-04 ENCOUNTER — Encounter (HOSPITAL_COMMUNITY): Payer: Self-pay | Admitting: *Deleted

## 2015-09-04 ENCOUNTER — Emergency Department (HOSPITAL_COMMUNITY): Payer: Medicare PPO

## 2015-09-04 ENCOUNTER — Other Ambulatory Visit: Payer: Self-pay

## 2015-09-04 ENCOUNTER — Ambulatory Visit (HOSPITAL_COMMUNITY)
Admission: RE | Admit: 2015-09-04 | Discharge: 2015-09-04 | Disposition: A | Discharge status: Home / Self Care | Payer: Medicare PPO | Source: Ambulatory Visit | Attending: Orthopaedic Surgery | Admitting: Orthopaedic Surgery

## 2015-09-04 ENCOUNTER — Emergency Department (HOSPITAL_COMMUNITY)
Admission: EM | Admit: 2015-09-04 | Discharge: 2015-09-05 | Disposition: A | Discharge status: Home / Self Care | Payer: Medicare PPO | Attending: Physician Assistant | Admitting: Physician Assistant

## 2015-09-04 DIAGNOSIS — F419 Anxiety disorder, unspecified: Secondary | ICD-10-CM | POA: Diagnosis not present

## 2015-09-04 DIAGNOSIS — M545 Low back pain, unspecified: Secondary | ICD-10-CM

## 2015-09-04 DIAGNOSIS — Z7952 Long term (current) use of systemic steroids: Secondary | ICD-10-CM | POA: Insufficient documentation

## 2015-09-04 DIAGNOSIS — M797 Fibromyalgia: Secondary | ICD-10-CM | POA: Diagnosis not present

## 2015-09-04 DIAGNOSIS — R079 Chest pain, unspecified: Secondary | ICD-10-CM | POA: Diagnosis present

## 2015-09-04 DIAGNOSIS — G894 Chronic pain syndrome: Secondary | ICD-10-CM | POA: Insufficient documentation

## 2015-09-04 DIAGNOSIS — K589 Irritable bowel syndrome without diarrhea: Secondary | ICD-10-CM | POA: Diagnosis not present

## 2015-09-04 DIAGNOSIS — K219 Gastro-esophageal reflux disease without esophagitis: Secondary | ICD-10-CM | POA: Diagnosis not present

## 2015-09-04 DIAGNOSIS — Z7982 Long term (current) use of aspirin: Secondary | ICD-10-CM | POA: Diagnosis not present

## 2015-09-04 DIAGNOSIS — R42 Dizziness and giddiness: Secondary | ICD-10-CM | POA: Insufficient documentation

## 2015-09-04 DIAGNOSIS — Z8541 Personal history of malignant neoplasm of cervix uteri: Secondary | ICD-10-CM | POA: Insufficient documentation

## 2015-09-04 DIAGNOSIS — Z79899 Other long term (current) drug therapy: Secondary | ICD-10-CM | POA: Insufficient documentation

## 2015-09-04 DIAGNOSIS — Z87828 Personal history of other (healed) physical injury and trauma: Secondary | ICD-10-CM | POA: Insufficient documentation

## 2015-09-04 DIAGNOSIS — I10 Essential (primary) hypertension: Secondary | ICD-10-CM | POA: Insufficient documentation

## 2015-09-04 DIAGNOSIS — E119 Type 2 diabetes mellitus without complications: Secondary | ICD-10-CM | POA: Insufficient documentation

## 2015-09-04 LAB — I-STAT TROPONIN, ED
TROPONIN I, POC: 0 ng/mL (ref 0.00–0.08)
Troponin i, poc: 0 ng/mL (ref 0.00–0.08)

## 2015-09-04 LAB — CBC
HCT: 40.3 % (ref 36.0–46.0)
HEMOGLOBIN: 13.2 g/dL (ref 12.0–15.0)
MCH: 27 pg (ref 26.0–34.0)
MCHC: 32.8 g/dL (ref 30.0–36.0)
MCV: 82.4 fL (ref 78.0–100.0)
PLATELETS: 231 10*3/uL (ref 150–400)
RBC: 4.89 MIL/uL (ref 3.87–5.11)
RDW: 12.9 % (ref 11.5–15.5)
WBC: 9.4 10*3/uL (ref 4.0–10.5)

## 2015-09-04 LAB — BASIC METABOLIC PANEL
ANION GAP: 12 (ref 5–15)
BUN: 13 mg/dL (ref 6–20)
CALCIUM: 9.5 mg/dL (ref 8.9–10.3)
CO2: 25 mmol/L (ref 22–32)
CREATININE: 0.93 mg/dL (ref 0.44–1.00)
Chloride: 101 mmol/L (ref 101–111)
Glucose, Bld: 100 mg/dL — ABNORMAL HIGH (ref 65–99)
Potassium: 3.6 mmol/L (ref 3.5–5.1)
SODIUM: 138 mmol/L (ref 135–145)

## 2015-09-04 LAB — POCT I-STAT CREATININE: Creatinine, Ser: 0.9 mg/dL (ref 0.44–1.00)

## 2015-09-04 MED ORDER — ONDANSETRON HCL 4 MG/2ML IJ SOLN
4.0000 mg | Freq: Once | INTRAMUSCULAR | Status: AC
  Administered 2015-09-04: 4 mg via INTRAVENOUS
  Filled 2015-09-04: qty 2

## 2015-09-04 MED ORDER — ACETAMINOPHEN 325 MG PO TABS
650.0000 mg | ORAL_TABLET | Freq: Once | ORAL | Status: AC
  Administered 2015-09-04: 650 mg via ORAL
  Filled 2015-09-04: qty 2

## 2015-09-04 MED ORDER — GADOBENATE DIMEGLUMINE 529 MG/ML IV SOLN
15.0000 mL | Freq: Once | INTRAVENOUS | Status: AC | PRN
  Administered 2015-09-04: 15 mL via INTRAVENOUS
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
## 2015-09-04 NOTE — ED Notes (Signed)
Pt off unit for imaging

## 2015-09-04 NOTE — ED Notes (Signed)
Pt reports pain in right and left shoulder blades that came and left quickly. Currently denies pain. Pt states that her head is "trying to start hurting" but again states she is not currently in pain and does not verbalize a numerical pain value. Blood pressures were reading as elevated, however upon entering room, newest reading 128/79.

## 2015-09-04 NOTE — Discharge Instructions (Signed)
We are unsure what caused your chest pain. Please follow-up with her cardiologist on Monday to make an appointment. We're happy to report that your labs and chest x-ray here are all normal.
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
## 2015-09-07 ENCOUNTER — Telehealth: Payer: Self-pay | Admitting: Cardiology

## 2015-09-07 ENCOUNTER — Other Ambulatory Visit: Payer: Self-pay | Admitting: Internal Medicine

## 2015-09-07 ENCOUNTER — Other Ambulatory Visit (INDEPENDENT_AMBULATORY_CARE_PROVIDER_SITE_OTHER): Payer: Medicare PPO

## 2015-09-07 ENCOUNTER — Encounter: Payer: Self-pay | Admitting: Internal Medicine

## 2015-09-07 ENCOUNTER — Ambulatory Visit (INDEPENDENT_AMBULATORY_CARE_PROVIDER_SITE_OTHER): Payer: Medicare PPO | Admitting: Internal Medicine

## 2015-09-07 VITALS — BP 140/90 | HR 72 | Temp 98.1°F | Ht 63.0 in | Wt 159.0 lb

## 2015-09-07 DIAGNOSIS — Z0189 Encounter for other specified special examinations: Secondary | ICD-10-CM | POA: Diagnosis not present

## 2015-09-07 DIAGNOSIS — Z23 Encounter for immunization: Secondary | ICD-10-CM | POA: Diagnosis not present

## 2015-09-07 DIAGNOSIS — I10 Essential (primary) hypertension: Secondary | ICD-10-CM | POA: Diagnosis not present

## 2015-09-07 DIAGNOSIS — I7300 Raynaud's syndrome without gangrene: Secondary | ICD-10-CM | POA: Diagnosis not present

## 2015-09-07 DIAGNOSIS — G894 Chronic pain syndrome: Secondary | ICD-10-CM

## 2015-09-07 DIAGNOSIS — R0789 Other chest pain: Secondary | ICD-10-CM

## 2015-09-07 DIAGNOSIS — Z0000 Encounter for general adult medical examination without abnormal findings: Secondary | ICD-10-CM

## 2015-09-07 DIAGNOSIS — M48061 Spinal stenosis, lumbar region without neurogenic claudication: Secondary | ICD-10-CM | POA: Insufficient documentation

## 2015-09-07 DIAGNOSIS — M4806 Spinal stenosis, lumbar region: Secondary | ICD-10-CM

## 2015-09-07 DIAGNOSIS — G4700 Insomnia, unspecified: Secondary | ICD-10-CM | POA: Insufficient documentation

## 2015-09-07 LAB — HEPATIC FUNCTION PANEL
ALT: 14 U/L (ref 0–35)
AST: 12 U/L (ref 0–37)
Albumin: 4.8 g/dL (ref 3.5–5.2)
Alkaline Phosphatase: 103 U/L (ref 39–117)
BILIRUBIN DIRECT: 0.1 mg/dL (ref 0.0–0.3)
TOTAL PROTEIN: 7.7 g/dL (ref 6.0–8.3)
Total Bilirubin: 1 mg/dL (ref 0.2–1.2)

## 2015-09-07 LAB — URINALYSIS, ROUTINE W REFLEX MICROSCOPIC
BILIRUBIN URINE: NEGATIVE
KETONES UR: NEGATIVE
NITRITE: NEGATIVE
SPECIFIC GRAVITY, URINE: 1.015 (ref 1.000–1.030)
Total Protein, Urine: NEGATIVE
Urine Glucose: NEGATIVE
Urobilinogen, UA: 0.2 (ref 0.0–1.0)
pH: 6.5 (ref 5.0–8.0)

## 2015-09-07 LAB — BASIC METABOLIC PANEL
BUN: 17 mg/dL (ref 6–23)
CALCIUM: 10.3 mg/dL (ref 8.4–10.5)
CO2: 28 meq/L (ref 19–32)
CREATININE: 0.94 mg/dL (ref 0.40–1.20)
Chloride: 99 mEq/L (ref 96–112)
GFR: 62.56 mL/min (ref >60.00)
Glucose, Bld: 135 mg/dL — ABNORMAL HIGH (ref 70–99)
Potassium: 3.7 mEq/L (ref 3.5–5.1)
Sodium: 137 mEq/L (ref 135–145)

## 2015-09-07 LAB — CBC WITH DIFFERENTIAL/PLATELET
BASOS ABS: 0.1 10*3/uL (ref 0.0–0.1)
Basophils Relative: 0.4 % (ref 0.0–3.0)
Eosinophils Absolute: 0 10*3/uL (ref 0.0–0.7)
Eosinophils Relative: 0 % (ref 0.0–5.0)
HEMATOCRIT: 42.1 % (ref 36.0–46.0)
HEMOGLOBIN: 14 g/dL (ref 12.0–15.0)
LYMPHS PCT: 11.8 % — AB (ref 12.0–46.0)
Lymphs Abs: 1.6 10*3/uL (ref 0.7–4.0)
MCHC: 33.3 g/dL (ref 30.0–36.0)
MCV: 81.1 fl (ref 78.0–100.0)
MONOS PCT: 1.2 % — AB (ref 3.0–12.0)
Monocytes Absolute: 0.2 10*3/uL (ref 0.1–1.0)
NEUTROS ABS: 11.8 10*3/uL — AB (ref 1.4–7.7)
Neutrophils Relative %: 86.6 % — ABNORMAL HIGH (ref 43.0–77.0)
Platelets: 288 10*3/uL (ref 150.0–400.0)
RBC: 5.19 Mil/uL — AB (ref 3.87–5.11)
RDW: 13.3 % (ref 11.5–15.5)
WBC: 13.6 10*3/uL — AB (ref 4.0–10.5)

## 2015-09-07 LAB — LIPID PANEL
CHOL/HDL RATIO: 2
Cholesterol: 172 mg/dL (ref 0–200)
HDL: 77.8 mg/dL (ref >39.00)
LDL Cholesterol: 71 mg/dL (ref 0–99)
NONHDL: 94.5
Triglycerides: 119 mg/dL (ref 0.0–149.0)
VLDL: 23.8 mg/dL (ref 0.0–40.0)

## 2015-09-07 LAB — TSH: TSH: 0.52 u[IU]/mL (ref 0.35–4.50)

## 2015-09-07 MED ORDER — CEPHALEXIN 500 MG PO CAPS: 500.0000 mg | ORAL_CAPSULE | Freq: Four times a day (QID) | ORAL | Status: DC

## 2015-09-07 MED ORDER — ZOLPIDEM TARTRATE 5 MG PO TABS: 5.0000 mg | ORAL_TABLET | Freq: Every evening | ORAL | Status: DC | PRN

## 2015-09-07 NOTE — Assessment & Plan Note (Signed)
Ok for ambien prn,.  to f/u any worsening symptoms or concerns  

## 2015-09-07 NOTE — Assessment & Plan Note (Signed)

## 2015-09-07 NOTE — Telephone Encounter (Signed)
Pt c/o of Chest Pain: STAT if CP now or developed within 24 hours  1. Are you having CP right now? No   2. Are you experiencing any other symptoms (ex. SOB, nausea, vomiting, sweating)? SOB(little to no exertion), nausea  3. How long have you been experiencing CP? Since Saturday and she went to the ER  4. Is your CP continuous or coming and going? Coming and going   5. Have you taken Nitroglycerin? Yes 2 , first at 5:05am and the second at 8:00am  ?

## 2015-09-07 NOTE — Patient Instructions (Addendum)
You had the Pneuomovax pneumonia shot today  Please take all new medication as prescribed - the ambien as needed  Please continue all other medications as before, and refills have been done if requested.  Please have the pharmacy call with any other refills you may need.  Please continue your efforts at being more active, low cholesterol diet, and weight control.  You are otherwise up to date with prevention measures today.  Please keep your appointments with your specialists as you may have planned  Please go to the LAB in the Basement (turn left off the elevator) for the tests to be done today  You will be contacted by phone if any changes need to be made immediately.  Otherwise, you will receive a letter about your results with an explanation, but please check with MyChart first.  Please remember to sign up for MyChart if you have not done so, as this will be important to you in the future with finding out test results, communicating by private email, and scheduling acute appointments online when needed.  Please return in 1 year for your yearly visit, or sooner if needed, with Lab testing done 3-5 days before

## 2015-09-07 NOTE — Assessment & Plan Note (Signed)
Benign phenomenon, d/w pt, ok to follow,  to f/u any worsening symptoms or concerns

## 2015-09-07 NOTE — Telephone Encounter (Signed)
Pt calling to report having had chest pain on Friday.  She describes it as a really strong sharp chest pain.  She pulled over (had been driving) and took a SL Ntg.  It felt like a 4" band around her chest, mostly feeling in the front of her chest and to the left.  She reports relief with SL but the chest pain returned and she took a 2nd SL at which time she drove herself to the [place].  She was evaluated there but was instructed to report to the ED for further eval including enzymes.  These were negative.  Saturday she reports "being near death" and being unsure of when she would take her last breath because she was so SOB.  She did not seek medical attention.  Yesterday she reports have energy and went to her daughters to do laundry but with cp off and on.  Today she is reporting having CP in the front of her chest off and on much like the pain on Friday but states she will not go to the ED for evaluation because she will just be told everything is "OK" and be sent home again.  She feels like "something is seriously wrong with me but no one is doing anything about it."  I tried to review with her the results from the evaluation completed at the ED for reassurance however she repeatedly interrupted.  She states she only wants Dr Aundra Dubin to tell her what to do.  Advised her I will forward this information to him however he is working at the hospital today and I am unsure of when he will be able to review this information.  She would like him to call her.

## 2015-09-07 NOTE — Progress Notes (Signed)
Subjective:    Patient ID: Renee [name], female    DOB: [birth year], 70 y.o.   MRN: [id number]  HPI  Here for wellness and f/u;  Overall doing ok;  Pt denies Chest pain, worsening SOB, DOE, wheezing, orthopnea, PND, worsening LE edema, palpitations, dizziness or syncope.  Pt denies neurological change such as new headache, facial or extremity weakness.  Pt denies polydipsia, polyuria, or low sugar symptoms. Pt states overall good compliance with treatment and medications, good tolerability, and has been trying to follow appropriate diet.  Pt denies worsening depressive symptoms, suicidal ideation or panic. No fever, night sweats, wt loss, loss of appetite, or other constitutional symptoms.  Pt states good ability with ADL's, has low fall risk, home safety reviewed and adequate, no other significant changes in hearing or vision, and only occasionally active with exercise.   Has just seen 1 wk ago per derm with severe facial angioedema, now resolved but still on tapering prednisone.  Did just have msall laceration with mistep at home, bled quite a bit, resolved with pressure, last tetanus 2015 intact.  Was seen at ED mar 24 with CP, has long hx of SVT%, CAD, long QTPatient is a 70 year old female with history of anxiety, chronic chest pain, implanted loop recorder, on flecainide presenting today with 14 seconds of chest pain. This happened while driving. Was not associated with diaphoresis and not radiate to either shoulder. Given the short duration and the fact is not exertionally related.  Did take 2 ntg last PM, no CP this am.  States she is to be called by cardiology for f/u appt with Dr Aundra Dubin. Pt continues to have recurring LBP without change in severity, bowel or bladder change, fever, wt loss,  worsening LE pain/numbness/weakness, gait change or falls, except for intermittent numbness to the distal toes (though touch is intact); had recent MRI with lumbar spinal stenosis, plans to f/u with her ortho who  has treated her hips.  Incidentally today with blue fingers and thenar eminences bilat c/w he hx of raynauds, has been seen per Dr Hawks/rheum in past, denies pain, just happened to notice today as she was arriving  Due to multiple reasons has ongoing issue with trying to get to sleep most nights,as well as staying asleep. Past Medical History  Diagnosis Date  . Bronchitis     Recurrent  . Hypertension   . Chronic chest pain     a. 2005 Cath: Mild nonobstructive plaque (30-40% LCX);  b. 08/2011 low risk myoview;  c. 09/2011 Coronary CT: NL cors w ca score of 0.  . AV nodal re-entry tachycardia [facility])     a. s/p RFA 1993- Dr. Rolland Porter, [place]  . Long QT interval     a. mild - advised to avoid meds that may prolong QT.  Marland Kitchen GERD (gastroesophageal reflux disease)   . Dysphagia   . Duodenitis without hemorrhage   . Diverticulosis of colon   . Irritable bowel syndrome   . Constipation, chronic   . Hemorrhoids   . Fecal incontinence   . Thyroid nodule   . Degenerative joint disease     a. s/p L Total Knee Arthroplasty.  . Fibromyalgia   . Chronic pain syndrome   . Neuropathy ([place])   . Anxiety   . Pre-syncope   . Cancer (HCC)     hx of cervical   ca  . SVT (supraventricular tachycardia) (HCC)     a. nonsustained SVT - previously offered flecainide but  refused;  b. 12/2012 30 day event monitor w/o significant arrhythmias.  . Osteoporosis   . Concussion     1979, 1993  . Broken neck ([place])     1993  . Diet-controlled type 2 diabetes mellitus ([place])   . Complication of anesthesia     blood pressure and heart dropped in 2009    . PONV (postoperative nausea and vomiting)    Past Surgical History  Procedure Laterality Date  . Biopsy thyroid    . Total hip arthroplasty  10/10    Left, at [facility]  . Hysteroscopy  2002    with resection of endometrial polyps. by Dr.MCPhail  . Coronary angioplasty  2007  . Cardiac electrophysiology study and ablation      atrioventricular nodal  reentant tachycardia  . Ablation of dysrhythmic focus  1993  . Cholecystectomy  1980  . Orif radial head / neck fracture  09/05/88  . Colonoscopy  06/25/2008    Diverticulosis and Hemorrhoids  . Esophagogastroduodenoscopy    . Loop recorder implant  08/21/2013    MDT LinQ implanted by Dr Lovena Le for syncope  . Tilt table study N/A 05/15/2013    Procedure: TILT TABLE STUDY;  Surgeon: Deboraha Sprang, MD;  Location: [facility] CATH LAB;  Service: Cardiovascular;  Laterality: N/A;  . Loop recorder implant N/A 08/21/2013    Procedure: LOOP RECORDER IMPLANT;  Surgeon: Evans Lance, MD;  Location: [facility] CATH LAB;  Service: Cardiovascular;  Laterality: N/A;  . Hysteroscopy w/d&c N/A 02/05/2015    Procedure: DILATATION AND CURETTAGE /HYSTEROSCOPY;  Surgeon: Vanessa Kick, MD;  Location: [facility];  Service: Gynecology;  Laterality: N/A;  . Colposcopy      reports that she has never smoked. She has never used smokeless tobacco. She reports that she drinks alcohol. She reports that she does not use illicit drugs. family history includes Asthma in her mother and sister; Diabetes in her brother, mother, and sister; Heart disease in her sister and sister; Lung cancer in her father; Ovarian cancer in her mother; Stroke in her mother. Allergies  Allergen Reactions  . Cyclobenzaprine Other (See Comments)    Pt states that this medication does not work for her.    . Adhesive [Tape] Itching, Rash and Other (See Comments)    Reaction:  Blisters  Pt states that she is allergic to the adhesive backing of EKG pads.     . Moxifloxacin Other (See Comments)    Reaction:  Unknown   . Quinolones Other (See Comments)    Reaction:  Unknown    Current Outpatient Prescriptions on File Prior to Visit  Medication Sig Dispense Refill  . ALPRAZolam (XANAX) 0.5 MG tablet Take 0.5-1 tablets (0.25-0.5 mg total) by mouth 2 (two) times daily as needed for anxiety. 90 tablet 1  . aspirin EC 81 MG tablet Take 81 mg by mouth daily.     . Blood  Glucose Monitoring Suppl (ACCU-CHEK NANO SMARTVIEW) W/DEVICE KIT Test once daily as directed (Patient taking differently: as directed. Pt is to use to test once daily.) 1 kit 0  . diltiazem (CARDIZEM CD) 120 MG 24 hr capsule Take 1 capsule (120 mg total) by mouth daily. 90 capsule 3  . flecainide (TAMBOCOR) 50 MG tablet Take 0.5 tablets (25 mg total) by mouth 2 (two) times daily. 90 tablet 2  . glucose blood (ACCU-CHEK SMARTVIEW) test strip Test 1 time daily (Patient taking differently: 1 each by Other route daily. ) 100 each 1  .  naproxen sodium (ANAPROX) 220 MG tablet Take 220 mg by mouth 2 (two) times daily as needed. For pain    . nitroGLYCERIN (NITROSTAT) 0.4 MG SL tablet Place 1 tablet (0.4 mg total) under the tongue every 5 (five) minutes as needed for chest pain. 30 tablet 0  . omeprazole (PRILOSEC) 40 MG capsule Take 1 capsule (40 mg total) by mouth every evening. 90 capsule 2  . pantoprazole (PROTONIX) 40 MG tablet Take 40 mg by mouth daily.    . potassium chloride (MICRO-K) 10 MEQ CR capsule Take 1 capsule (10 mEq total) by mouth daily. 90 capsule 0  . predniSONE (DELTASONE) 20 MG tablet Take 40 mg by mouth daily with breakfast.    . triamterene-hydrochlorothiazide (DYAZIDE) 37.5-25 MG capsule Take 1 each (1 capsule total) by mouth daily. 90 capsule 0  . dicyclomine (BENTYL) 20 MG tablet Take 1 tablet (20 mg total) by mouth 3 (three) times daily before meals. (Patient not taking: Reported on 09/07/2015) 270 tablet 2  . zolpidem (AMBIEN) 5 MG tablet Take 5 mg by mouth daily.     No current facility-administered medications on file prior to visit.     Review of Systems Constitutional: Negative for increased diaphoresis, or other activity, appetite or siginficant weight change other than noted HENT: Negative for worsening hearing loss, ear pain, facial swelling, mouth sores and neck stiffness.   Eyes: Negative for other worsening pain, redness or visual disturbance.  Respiratory: Negative  for choking or stridor Cardiovascular: Negative for other chest pain and palpitations.  Gastrointestinal: Negative for worsening diarrhea, blood in stool, or abdominal distention Genitourinary: Negative for hematuria, flank pain or change in urine volume.  Musculoskeletal: Negative for myalgias or other joint complaints.  Skin: Negative for other color change and wound or drainage.  Neurological: Negative for syncope and numbness. other than noted Hematological: Negative for adenopathy. or other swelling Psychiatric/Behavioral: Negative for hallucinations, SI, self-injury, decreased concentration or other worsening agitation.      Objective:   Physical Exam BP 140/90 mmHg  Pulse 72  Temp(Src) 98.1 F (36.7 C) (Oral)  Ht '5\' 3"'$  (1.6 m)  Wt 159 lb (72.122 kg)  BMI 28.17 kg/m2  SpO2 97% VS noted,  Constitutional: Pt is oriented to person, place, and time. Appears well-developed and well-nourished, in no significant distress Head: Normocephalic and atraumatic  Eyes: Conjunctivae and EOM are normal. Pupils are equal, round, and reactive to light Right Ear: External ear normal.  Left Ear: External ear normal Nose: Nose normal.  Mouth/Throat: Oropharynx is clear and moist  Neck: Normal range of motion. Neck supple. No JVD present. No tracheal deviation present or significant neck LA or mass Cardiovascular: Normal rate, regular rhythm, normal heart sounds and intact distal pulses.   Pulmonary/Chest: Effort normal and breath sounds without rales or wheezing  Abdominal: Soft. Bowel sounds are normal. NT. No HSM  Musculoskeletal: Normal range of motion. Exhibits no edema Lymphadenopathy: Has no cervical adenopathy.  Neurological: Pt is alert and oriented to person, place, and time. Pt has normal reflexes. No cranial nerve deficit. Motor grossly intact Skin: Skin is warm and dry. No rash noted or new ulcers but indeed most of bilat fingers and thenar eminences has bluish hue, NT, no  swelling Psychiatric:  Has 2+ nervous mood and affect. Behavior is normal.   Recent MRI Mar 24  IMPRESSION: 1. Lumbar spine spondylosis as described above. 2. At L5-S1 there is a mild broad-based disc bulge eccentric towards the left with moderate  bilateral facet arthropathy and moderate left foraminal stenosis. 3. At L2-3 there is a mild broad-based disc bulge. Mild bilateral facet arthropathy. Mild spinal stenosis.  Lab Results  Component Value Date   WBC 9.4 09/04/2015   HGB 13.2 09/04/2015   HCT 40.3 09/04/2015   PLT 231 09/04/2015   GLUCOSE 100* 09/04/2015   CHOL 154 12/18/2014   TRIG 66.0 12/18/2014   HDL 63.60 12/18/2014   LDLCALC 77 12/18/2014   ALT 24 12/18/2014   AST 24 12/18/2014   NA 138 09/04/2015   K 3.6 09/04/2015   CL 101 09/04/2015   CREATININE 0.93 09/04/2015   BUN 13 09/04/2015   CO2 25 09/04/2015   TSH 0.76 12/18/2014   INR 1.05 03/30/2014   HGBA1C 5.3 12/05/2011       Assessment & Plan:

## 2015-09-07 NOTE — Telephone Encounter (Signed)
Called patient about moving her appointment and scheduled her for 09/11/15. Ordered Lexiscan and sent message to [facility] to schedule. Asked patient to send remote transmission when she gets home. Will send to device clinic to follow-up with loop recorder. Patient verbalized understanding.

## 2015-09-07 NOTE — Addendum Note (Signed)
Addended by: Johney Frame, Adelina Mings M on: 09/07/2015 03:00 PM   Modules accepted: Orders

## 2015-09-07 NOTE — Telephone Encounter (Signed)
I spoke with her today.  Please arrange Lexiscan Cardiolite for her this week.  Also needs to have remote loop recorder check to make sure she did not have tachyarrhythmia episode.    Have her appt with me moved up from May, can overbook.

## 2015-09-07 NOTE — Assessment & Plan Note (Addendum)
Neuro exam ok, but with persistent ? Worsening pain, recent MRI reviewed with pt, for f/u with ortho as planned  In addition to the time spent performing CPE, I spent an additional 40 minutes face to face,in which greater than 50% of this time was spent in counseling and coordination of care for patient's acute illness as documented.

## 2015-09-07 NOTE — Progress Notes (Signed)
Pre visit review using our clinic review tool, if applicable. No additional management support is needed unless otherwise documented below in the visit note. 

## 2015-09-07 NOTE — Assessment & Plan Note (Signed)
stable overall by history and exam, recent data reviewed with pt, and pt to continue medical treatment as before,  to f/u any worsening symptoms or concerns BP Readings from Last 3 Encounters:  09/07/15 140/90  09/04/15 137/108  08/25/15 136/78

## 2015-09-07 NOTE — Assessment & Plan Note (Signed)
Ongoing chronic persistent, declines referral to pain management

## 2015-09-08 ENCOUNTER — Telehealth (HOSPITAL_COMMUNITY): Payer: Self-pay | Admitting: *Deleted

## 2015-09-08 ENCOUNTER — Telehealth: Payer: Self-pay

## 2015-09-08 NOTE — Telephone Encounter (Signed)
Called patient about her appointment for the [place] and went over instructions. Patient verbalized understanding and will be at her appointment 09/10/15 at 1230.

## 2015-09-08 NOTE — Telephone Encounter (Signed)
No events/alerts on manual [facility] transmission from ILR.  Presenting rhythm is sinus rhythm.

## 2015-09-08 NOTE — Telephone Encounter (Signed)
Left message on voicemail in reference to upcoming appointment scheduled for 09/10/15. Phone number given for a call back so details instructions can be given. Zenna Traister, Ranae Palms

## 2015-09-08 NOTE — Telephone Encounter (Signed)
Patient given detailed instructions per Myocardial Perfusion Study Information Sheet for the test on 09/10/15 at 1245. Patient notified to arrive 15 minutes early and that it is imperative to arrive on time for appointment to keep from having the test rescheduled.  If you need to cancel or reschedule your appointment, please call the office within 24 hours of your appointment. Failure to do so may result in a cancellation of your appointment, and a $50 no show fee. Patient verbalized understanding.Renee [name]

## 2015-09-10 ENCOUNTER — Ambulatory Visit (HOSPITAL_COMMUNITY): Payer: Medicare PPO | Attending: Cardiovascular Disease

## 2015-09-10 DIAGNOSIS — E119 Type 2 diabetes mellitus without complications: Secondary | ICD-10-CM | POA: Insufficient documentation

## 2015-09-10 DIAGNOSIS — R9439 Abnormal result of other cardiovascular function study: Secondary | ICD-10-CM | POA: Insufficient documentation

## 2015-09-10 DIAGNOSIS — I10 Essential (primary) hypertension: Secondary | ICD-10-CM | POA: Insufficient documentation

## 2015-09-10 DIAGNOSIS — R0789 Other chest pain: Secondary | ICD-10-CM | POA: Insufficient documentation

## 2015-09-10 LAB — MYOCARDIAL PERFUSION IMAGING
CHL CUP NUCLEAR SDS: 2
LHR: 0.26
LV dias vol: 78 mL (ref 46–106)
LV sys vol: 32 mL
Peak HR: 101 {beats}/min
Rest HR: 67 {beats}/min
SRS: 8
SSS: 10
TID: 1.06

## 2015-09-10 MED ORDER — REGADENOSON 0.4 MG/5ML IV SOLN
0.4000 mg | Freq: Once | INTRAVENOUS | Status: AC
  Administered 2015-09-10: 0.4 mg via INTRAVENOUS

## 2015-09-10 MED ORDER — TECHNETIUM TC 99M SESTAMIBI GENERIC - CARDIOLITE
10.4000 | Freq: Once | INTRAVENOUS | Status: AC | PRN
  Administered 2015-09-10: 10 via INTRAVENOUS

## 2015-09-10 MED ORDER — TECHNETIUM TC 99M SESTAMIBI GENERIC - CARDIOLITE
32.7000 | Freq: Once | INTRAVENOUS | Status: AC | PRN
  Administered 2015-09-10: 32.7 via INTRAVENOUS

## 2015-09-10 MED ORDER — AMINOPHYLLINE 25 MG/ML IV SOLN
150.0000 mg | Freq: Once | INTRAVENOUS | Status: AC
  Administered 2015-09-10: 150 mg via INTRAVENOUS

## 2015-09-11 ENCOUNTER — Ambulatory Visit (INDEPENDENT_AMBULATORY_CARE_PROVIDER_SITE_OTHER): Payer: Medicare PPO | Admitting: Cardiology

## 2015-09-11 ENCOUNTER — Encounter: Payer: Self-pay | Admitting: Cardiology

## 2015-09-11 VITALS — BP 140/82 | HR 66 | Ht 63.0 in | Wt 161.1 lb

## 2015-09-11 DIAGNOSIS — R0789 Other chest pain: Secondary | ICD-10-CM

## 2015-09-11 DIAGNOSIS — I10 Essential (primary) hypertension: Secondary | ICD-10-CM

## 2015-09-11 DIAGNOSIS — IMO0001 Reserved for inherently not codable concepts without codable children: Secondary | ICD-10-CM

## 2015-09-11 DIAGNOSIS — R6889 Other general symptoms and signs: Secondary | ICD-10-CM

## 2015-09-11 DIAGNOSIS — R079 Chest pain, unspecified: Secondary | ICD-10-CM | POA: Diagnosis not present

## 2015-09-11 DIAGNOSIS — R569 Unspecified convulsions: Secondary | ICD-10-CM | POA: Diagnosis not present

## 2015-09-11 MED ORDER — DILTIAZEM HCL ER COATED BEADS 240 MG PO CP24: 240.0000 mg | ORAL_CAPSULE | Freq: Every day | ORAL | Status: DC

## 2015-09-11 NOTE — Patient Instructions (Signed)
Medication Instructions:  Increase Diltiazem CD to 240mg  daily. You can take 2 of your 120mg  daily at the same time and use your current supply  Labwork: none  Testing/Procedures: none  Follow-Up: Your physician recommends that you schedule a follow-up appointment in: 1 month with PA/NP  Your physician recommends that you schedule a follow-up appointment in: 6 months with Dr Aundra Dubin        If you need a refill on your cardiac medications before your next appointment, please call your pharmacy.

## 2015-09-13 DIAGNOSIS — R079 Chest pain, unspecified: Secondary | ICD-10-CM | POA: Insufficient documentation
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
## 2015-09-14 ENCOUNTER — Encounter: Payer: Self-pay | Admitting: Gastroenterology

## 2015-09-14 ENCOUNTER — Ambulatory Visit (INDEPENDENT_AMBULATORY_CARE_PROVIDER_SITE_OTHER): Payer: Medicare PPO | Admitting: *Deleted

## 2015-09-14 DIAGNOSIS — R55 Syncope and collapse: Secondary | ICD-10-CM | POA: Diagnosis not present

## 2015-09-15 NOTE — Progress Notes (Signed)
Carelink Summary Report / Loop Recorder 

## 2015-09-16 ENCOUNTER — Telehealth: Payer: Self-pay | Admitting: Cardiology

## 2015-09-16 DIAGNOSIS — R0602 Shortness of breath: Secondary | ICD-10-CM

## 2015-09-16 NOTE — Telephone Encounter (Signed)
Notified of Dr. Claris Gladden recommendation on seeing the Rheumatologist- Dr. Amil Amen.  She states that she doesn't feel like she needs to see a Rheumatologist. She states she doesn't know how the Diltiazem has effected her because she hasn't been on it long enough. She insisted that Dr. Aundra Dubin and Dr. Lovena Le get together and "DECIDE WHAT IS CAUSING HER TO BE SOB AND WEAK".  Offered her an appointment with an app but she states she isn't going to see a PA or NP. She insists that message be sent back to Dr. Aundra Dubin and to Dr. Lovena Le to find out what is wrong with her.

## 2015-09-16 NOTE — Telephone Encounter (Signed)
With the blue fingers, could have her evaluated by rheumatology.  She has a lot of nonspecific symptoms and they may help to put thinks together.  Offer to let her see Dr Amil Amen.  If she feels worse on higher diltiazem CD, can decrease back to 120 mg daily but I increased the dose because of concern for vasospasm.

## 2015-09-16 NOTE — Telephone Encounter (Signed)
Renee [name], I have no idea what could be causing her symptoms. I agree with Dr. Oleh Genin suggestion. She is not having any arrhythmias that could be causing her problem. GT

## 2015-09-16 NOTE — Telephone Encounter (Signed)
Pt was in hospital Friday a week ago-pt weak -SOB   -all pt would say-said he knows her and she doesn't want to "go over all his again"  just wants a call back @ [phone]

## 2015-09-16 NOTE — Telephone Encounter (Signed)
Calling stating she is very discouraged.  She is so weak and SOB that she can't even walk or do anything in her house. Denies CP or chest discomfort. States there are things she needs to do but can't do because of how she feels.  States she has had 2 good days out of the past 3 weeks.  At last office visit-3/31 Dr. Aundra Dubin increased her Diltiazem 240 mg daily.  Also states her hands turned blue when she was in Dr. Judi Cong office on 3/27 but no one seems to know the answer.  She states "SHE WANTS SOMETHING TO BE DONE". "I AM TIRED OF FEELING THIS WAY".  Advised that Dr. Aundra Dubin not in office but will forward message to him for recommendations.

## 2015-09-17 NOTE — Telephone Encounter (Signed)
Patient agrees to CPX.  Reviewed instructions with patient and test ordered for scheduling.  Patient also wanted Dr. Aundra Dubin to know she is only taking Cardizem 120 daily instead of her increased dose. She read about the medication and does not want to take more than that.

## 2015-09-17 NOTE — Telephone Encounter (Signed)
The only thing I could think of to further investigate her shortness of breath would be a cardiopulmonary exercise test . This would show [place] if her dyspnea is coming from her heart, her lungs, or deconditioning.

## 2015-09-18 ENCOUNTER — Other Ambulatory Visit: Payer: Self-pay | Admitting: *Deleted

## 2015-09-18 DIAGNOSIS — I10 Essential (primary) hypertension: Secondary | ICD-10-CM

## 2015-09-18 DIAGNOSIS — R0789 Other chest pain: Secondary | ICD-10-CM

## 2015-09-24 ENCOUNTER — Other Ambulatory Visit: Payer: Self-pay | Admitting: Internal Medicine

## 2015-09-24 ENCOUNTER — Other Ambulatory Visit: Payer: Self-pay | Admitting: Cardiology

## 2015-09-30 ENCOUNTER — Ambulatory Visit (HOSPITAL_COMMUNITY): Payer: Medicare PPO | Attending: Internal Medicine

## 2015-09-30 DIAGNOSIS — R0602 Shortness of breath: Secondary | ICD-10-CM | POA: Diagnosis not present

## 2015-10-02 ENCOUNTER — Telehealth: Payer: Self-pay | Admitting: Internal Medicine

## 2015-10-02 ENCOUNTER — Telehealth: Payer: Self-pay | Admitting: Cardiology

## 2015-10-02 NOTE — Telephone Encounter (Signed)
Pt called in and is upset that she was charged $15.00 for her cpe on 3/27.  She wants the charge removed and stated that she is not paying it?

## 2015-10-02 NOTE — Telephone Encounter (Signed)
F/u  Pt returning RN phone call- test results. Please call back and discuss.

## 2015-10-02 NOTE — Telephone Encounter (Signed)
Spoke with patient about recent CPX results.

## 2015-10-02 NOTE — Telephone Encounter (Signed)
Io called to patient to explain. She was highly upset. She told me that she was not paying the bill. i apologized to her.

## 2015-10-02 NOTE — Telephone Encounter (Signed)
Not sure how to fix this, or who to get to fix this. Is there anything that you know to do? Please advise

## 2015-10-02 NOTE — Telephone Encounter (Signed)
The charge I believe came from the exam issues that day other than a simple wellness exam, including follow up of her LBP, raynauds phenomenon, chronic pain, HTN and insomnia; I would not know how to forgive the charge  Perhaps she could be referred to Ms Edwina Barth for discussion in this regard, thanks

## 2015-10-06 ENCOUNTER — Telehealth: Payer: Self-pay | Admitting: *Deleted

## 2015-10-06 ENCOUNTER — Telehealth: Payer: Self-pay | Admitting: Cardiology

## 2015-10-06 MED ORDER — ACCU-CHEK MULTICLIX LANCETS MISC: 1.0000 | Freq: Two times a day (BID) | Status: DC

## 2015-10-06 MED ORDER — GLUCOSE BLOOD VI STRP: 1.0000 | ORAL_STRIP | Freq: Two times a day (BID) | Status: DC

## 2015-10-06 NOTE — Telephone Encounter (Signed)
Pt asking for prescription for needles to check her glucose.  Pt advised she would need to contact her PCP for prescription for diabetic supplies.

## 2015-10-06 NOTE — Telephone Encounter (Signed)
Pt states Dr Jenny Reichmann is her PCP. Pt and I have attempted to contact Dr Gwynn Burly office and have been unable to get a call through to his office.

## 2015-10-06 NOTE — Telephone Encounter (Signed)
Receive  Call pt states she is needing lancets for her accu-chek nano. Since she is out would like rx call into [facility] as well. Sent lancets & strips to [facility] & lancet to [facility]...Renee [name]

## 2015-10-06 NOTE — Telephone Encounter (Signed)
New Message  Pt calling to speak w/ RN concerning her checking her blood sugar- Please call back and discuss.

## 2015-10-06 NOTE — Telephone Encounter (Signed)
I spoke with [facility], phones are working, they have been a meeting from 12-1. Pt is aware Primary Care phones are working.

## 2015-10-07 ENCOUNTER — Telehealth: Payer: Self-pay | Admitting: Cardiology

## 2015-10-07 NOTE — Telephone Encounter (Signed)
Pt c/o Shortness Of Breath: STAT if SOB developed within the last 24 hours or pt is noticeably SOB on the phone  1. Are you currently SOB (can you hear that pt is SOB on the phone)? Slightly   2. How long have you been experiencing SOB?  For a few months   3. Are you SOB when sitting or when up moving around? Sitting(minimal exertion)   4. Are you currently experiencing any other symptoms? BP 198/85 HR 71

## 2015-10-08 NOTE — Telephone Encounter (Signed)
I spoke with patient, pt asked me to call her back.

## 2015-10-08 NOTE — Telephone Encounter (Signed)
LMTCB

## 2015-10-08 NOTE — Telephone Encounter (Signed)
Pt asked me to call her back about 10 AM or 11 AM.

## 2015-10-09 ENCOUNTER — Telehealth: Payer: Self-pay | Admitting: Cardiology

## 2015-10-09 NOTE — Telephone Encounter (Signed)
Returned call to patient Patient is upset as she states she was never told she was in a-fib until she had a stress test last week She "doesn't know why she is in and out of a-fib" She states she only has 1 good day out of the week Today is a good day for her Patient states she has been in AF on and off for the last 2-3 months - she states she is in AF more than she is out of it Patient was upset that she called in earlier this week about a BP reading of 198 and never got a call back the same day  Patient states she has sent loop recording readings - within the last 3 days  -- she states it should show very erratic readings based on what her BP monitor shows -- informed her that message would be routed to device clinic to pull readings and follow up with patient   Patient has appointment with Gerrianne Scale PA on 5/1 Advised patient to monitor symptoms and have a list of questions/concerns for her appointment as she seems to have quite a few concerns  She is aware that the message will be sent to the device clinic and Dr. Aundra Dubin -- patient would appreciate call from device clinic regarding loop recorder readings

## 2015-10-09 NOTE — Telephone Encounter (Signed)
°  New Prob   Pt states she has been in and out of A-Fib for the last few weeks. Requesting to speak to a nurse. Please call.    Pt c/o Shortness Of Breath: STAT if SOB developed within the last 24 hours or pt is noticeably SOB on the phone  1. Are you currently SOB (can you hear that pt is SOB on the phone)? No  2. How long have you been experiencing SOB? 2-3 months  3. Are you SOB when sitting or when up moving around? Both  4. Are you currently experiencing any other symptoms? Weakness

## 2015-10-09 NOTE — Telephone Encounter (Signed)
Called patient to make her aware that her ILR presenting rhythm shows NSR and that she has had no AF episodes.  Her most recent episodes were tachy episodes from 05/20/15, which have been previously reviewed by Dr. Lovena Le and deemed to be SVT.  Patient states that she does not believe that she has had no AF because the "little girl who ran the exercise test told me so".  Patient said monitor tech asked her if she was "normally in A-fib, you've been in A-fib the whole time".  Reviewed exercise test results and results say that patient was in NSR.  Patient is aware of this, but she is very concerned that no doctor has told her that she has AF.  Assured patient that her [facility] transmissions are up to date (most recently this morning) and that I do not see any alerts for abnormal rhythms.  At this point I explained that I will have to defer to Ermalinda Barrios, PA and Dr. Aundra Dubin as I am unable to reassure her that her ILR has not shown arrhythmia.  Patient is agreeable to this plan.    Patient is aware that I will route this call to Dr. Aundra Dubin and Ermalinda Barrios, PA for further recommendations.

## 2015-10-11 NOTE — Telephone Encounter (Signed)
No atrial fibrillation has been seen.

## 2015-10-12 ENCOUNTER — Ambulatory Visit: Payer: Medicare PPO | Admitting: Physician Assistant

## 2015-10-12 ENCOUNTER — Telehealth: Payer: Self-pay | Admitting: *Deleted

## 2015-10-12 NOTE — Telephone Encounter (Signed)
LMTCB for pt to follow up on appt cancelled earlier today with Selinda Eon.

## 2015-10-12 NOTE — Telephone Encounter (Signed)
Pt has appt today with Ermalinda Barrios, PA, appt notes indicate appt was confirmed with patient.

## 2015-10-12 NOTE — Telephone Encounter (Signed)
Pt cancelled appt with Ermalinda Barrios for today.

## 2015-10-12 NOTE — Telephone Encounter (Signed)
called to move pt, she does not want to see Estella Husk, she wants to see her doctor, will check to see if we can get her into see Dr. Aundra Dubin. Will have Webb Silversmith call her back.

## 2015-10-14 ENCOUNTER — Ambulatory Visit (INDEPENDENT_AMBULATORY_CARE_PROVIDER_SITE_OTHER): Payer: Medicare PPO | Admitting: *Deleted

## 2015-10-14 DIAGNOSIS — R55 Syncope and collapse: Secondary | ICD-10-CM

## 2015-10-14 NOTE — Telephone Encounter (Signed)
Device Clinic states they have received regular device transmissions and there have been no events transmitted since December.   Pt states she continues to have shortness of breath with weak spells. Pt is asking for recommendations from Dr Aundra Dubin about further evaluation of these symptoms. Pt advised I will forward to Dr Aundra Dubin for review.

## 2015-10-14 NOTE — Telephone Encounter (Signed)
Pt states she keeps a log of her heart rate and BP and it seems to vary frequently.

## 2015-10-14 NOTE — Telephone Encounter (Signed)
Pt was scheduled to see Renee [name] 10/12/15  in follow up after office visit with Dr Aundra Dubin 09/11/15. Pt states she cancelled appointment, she did not feel it would be beneficial to her.  Pt states she continues to have shortness of breath and spells of weakness.

## 2015-10-15 NOTE — Progress Notes (Signed)
Carelink Summary Report / Loop Recorder 

## 2015-10-15 NOTE — Telephone Encounter (Signed)
Would reschedule an appointment, maybe with Dr Lovena Le if she is concerned about afib.

## 2015-10-15 NOTE — Telephone Encounter (Signed)
I called and spoke with the patient to advise her of Dr. Claris Gladden recommendations. The patient is frustrated/ flustered by her symptoms. She feels like she is "out in a field by herself and no one is listening to me." She states she saw Dr. Lovena Le recently and he felt like she would "live a long time." She does not know who she needs to see. She would like to talk with Webb Silversmith as she knows exactly what is going on with her. I advised the patient I would ask Webb Silversmith to call her back tomorrow.

## 2015-10-16 NOTE — Telephone Encounter (Signed)
Pt declined to schedule an appointment with Dr Lovena Le at this time.

## 2015-10-16 NOTE — Telephone Encounter (Signed)
Pt declined to schedule appt with pulmonary to further evaluate shortness of breath, pt states she has a history of bronchitis but hasn't been a problem for her in several years. Pt states breathing was recently checked at time of CPX stress test.   Pt declined to schedule appt with Dr Jenny Reichmann, she states she has been monitoring her glucose and it has been okay. Pt does not think Dr Jenny Reichmann would be able to help her with further evaluation of her current symptoms.  Pt states she will continue to monitor her heart rate and BP.  Pt aware she has been scheduled to see Dr Aundra Dubin for her 6 month follow up 02/29/16 at 1:45PM.

## 2015-10-26 ENCOUNTER — Ambulatory Visit: Payer: Medicare PPO | Admitting: Cardiology

## 2015-10-28 ENCOUNTER — Encounter: Payer: Self-pay | Admitting: Physician Assistant

## 2015-11-07 LAB — CUP PACEART REMOTE DEVICE CHECK: MDC IDC SESS DTM: 20170404020854

## 2015-11-07 NOTE — Progress Notes (Signed)
Carelink summary report received. Battery status OK. Normal device function. No new symptom episodes, tachy episodes, brady, or pause episodes. No new AF episodes. Monthly summary reports and ROV/PRN 

## 2015-11-13 ENCOUNTER — Telehealth: Payer: Self-pay | Admitting: Cardiology

## 2015-11-13 ENCOUNTER — Ambulatory Visit (INDEPENDENT_AMBULATORY_CARE_PROVIDER_SITE_OTHER): Payer: Medicare PPO | Admitting: *Deleted

## 2015-11-13 DIAGNOSIS — R55 Syncope and collapse: Secondary | ICD-10-CM

## 2015-11-13 NOTE — Telephone Encounter (Signed)
Spoke w/ pt and requested that she send a manual transmission b/c her home monitor has not updated in at least 14 days.   

## 2015-11-16 NOTE — Progress Notes (Signed)
Carelink Summary Report / Loop Recorder 

## 2015-11-22 LAB — CUP PACEART REMOTE DEVICE CHECK: MDC IDC SESS DTM: 20170504023913

## 2015-11-22 NOTE — Progress Notes (Signed)
Carelink summary report received. Battery status OK. Normal device function. No new symptom episodes, tachy episodes, brady, or pause episodes. No new AF episodes. Monthly summary reports and ROV/PRN 

## 2015-12-10 ENCOUNTER — Telehealth: Payer: Self-pay | Admitting: Internal Medicine

## 2015-12-10 ENCOUNTER — Telehealth: Payer: Self-pay | Admitting: Cardiology

## 2015-12-10 NOTE — Telephone Encounter (Signed)
New message   Pt verbalized that she is returning missed call from rn

## 2015-12-10 NOTE — Telephone Encounter (Signed)
LMTCB//sss 

## 2015-12-10 NOTE — Telephone Encounter (Signed)
Pt states starting yesterday she developed increase in shortness of breath, increase in fatigue, and weakness/pain starting in top front of legs going to knees, with exertion.  Pt denies LE edema, states she has not gained any weight.  Pt states she also has had  major cramp behind one knee or another for several months. Pt states she usually wakes up in bed with this cramp, it can be severe, not relieved with Aleve.  Pt states yesterday BP 144/112 ,recheck BP 128/73, heart rate 89 recheck heart rate 73. Pt states she felt her heart rate was faster and palpitations more frequent  than usual yesterday..  Pt is asking for a call back from [facility].   Pt advised I will forward to [facility] and Dr Aundra Dubin for review.

## 2015-12-10 NOTE — Telephone Encounter (Signed)
New Message:   Please call,says she is getting so short of breath at times she can hardly stand up and walk.

## 2015-12-10 NOTE — Progress Notes (Signed)
  This encounter was created in error - please disregard.    ROS     

## 2015-12-10 NOTE — Telephone Encounter (Signed)
New Message:   Please call,concerning her loop recorder.

## 2015-12-10 NOTE — Telephone Encounter (Signed)
Pt advised appt has been scheduled for her with Nell Range, PA 12/11/15 at 3PM.

## 2015-12-10 NOTE — Telephone Encounter (Signed)
Sounds like she will need an appointment to assess.

## 2015-12-10 NOTE — Telephone Encounter (Signed)
Informed patient that the transmission received this morning did not show any episodes. Patient states that she has not been able to use her symptom activator for a while bc it hasn't been working. I told patient that I would send an email to [facility] to have a new activator shipped to her bc the device would not record an episode at a rate of 105bpm. Patient voiced understanding and appreciation of information.  I told her that the symptom activator should get to her within the next 2 weeks. Again, patient voiced understanding.

## 2015-12-11 ENCOUNTER — Encounter: Payer: Medicare PPO | Admitting: Physician Assistant

## 2015-12-14 ENCOUNTER — Ambulatory Visit (INDEPENDENT_AMBULATORY_CARE_PROVIDER_SITE_OTHER): Payer: Medicare PPO | Admitting: *Deleted

## 2015-12-14 DIAGNOSIS — R55 Syncope and collapse: Secondary | ICD-10-CM

## 2015-12-14 NOTE — Progress Notes (Signed)
Carelink Summary Report / Loop Recorder 

## 2015-12-21 LAB — CUP PACEART REMOTE DEVICE CHECK: MDC IDC SESS DTM: 20170703033755

## 2015-12-23 ENCOUNTER — Other Ambulatory Visit: Payer: Self-pay | Admitting: Internal Medicine

## 2015-12-23 DIAGNOSIS — Z1231 Encounter for screening mammogram for malignant neoplasm of breast: Secondary | ICD-10-CM

## 2016-01-03 ENCOUNTER — Encounter: Payer: Self-pay | Admitting: Internal Medicine

## 2016-01-04 ENCOUNTER — Ambulatory Visit
Admission: RE | Admit: 2016-01-04 | Discharge: 2016-01-04 | Disposition: A | Discharge status: Home / Self Care | Payer: Medicare PPO | Source: Ambulatory Visit | Attending: Internal Medicine | Admitting: Internal Medicine

## 2016-01-04 ENCOUNTER — Telehealth: Payer: Self-pay | Admitting: *Deleted

## 2016-01-04 DIAGNOSIS — Z1231 Encounter for screening mammogram for malignant neoplasm of breast: Secondary | ICD-10-CM

## 2016-01-04 NOTE — Telephone Encounter (Signed)
Patient returned call.  She reports that on the morning of 01/01/16, around 3:00am, she woke up with nausea, vomiting, and diarrhea.  Pause episode correlates with patient's vomiting episode.  Patient reports she became overheated and dehydrated from "perspiring profusely" on Thursday "and just couldn't cool off".  Patient was able to call her daughter and son-in-law for assistance.  Patient's N/V/D resolved by Saturday afternoon and her symptoms have not returned.  Advised patient to ensure she is taking in enough fluid and electrolytes when she is working outside or exerting herself in the heat.  Recommended trying Gatorade/Powerade and alternating with water when she is sweating a lot.  Patient verbalizes understanding of all instructions.  Will review with Dr. Lovena Le when he is back in the office and call patient if any additional recommendations.

## 2016-01-04 NOTE — Telephone Encounter (Signed)
LMOVM requesting call back.  Gave device clinic phone number for return call.  Will patient if she experienced any symptoms during pause episode on 01/01/16 at 3:07am.

## 2016-01-05 NOTE — Telephone Encounter (Signed)
Discussed with Dr. Lovena Le, who advised that patient should come in for an OV to discuss episode.  Per Dr. Lovena Le, okay to book during consult slot.  Patient agreeable to appointment with Dr. Lovena Le on 01/27/16 at 10:15am.  She is aware to call in the interim with worsening symptoms, questions, or concerns.  Patient denies additional questions or concerns at this time.

## 2016-01-08 ENCOUNTER — Telehealth: Payer: Self-pay | Admitting: Internal Medicine

## 2016-01-08 NOTE — Telephone Encounter (Signed)
I received a call from [place] from [facility] team. [name] wanting to be sure pt is compliant with Medicare and she wants either notes from her last bone density or to be sure pt is scheduled for one.  Her # [phone] fax # [phone]

## 2016-01-12 ENCOUNTER — Ambulatory Visit (INDEPENDENT_AMBULATORY_CARE_PROVIDER_SITE_OTHER): Payer: Medicare PPO | Admitting: *Deleted

## 2016-01-12 DIAGNOSIS — R55 Syncope and collapse: Secondary | ICD-10-CM

## 2016-01-13 ENCOUNTER — Other Ambulatory Visit: Payer: Self-pay | Admitting: *Deleted

## 2016-01-13 MED ORDER — POTASSIUM CHLORIDE ER 10 MEQ PO CPCR: 10.0000 meq | ORAL_CAPSULE | Freq: Every day | ORAL | 0 refills | Status: DC

## 2016-01-13 NOTE — Progress Notes (Signed)
Carelink Summary Report / Loop Recorder 

## 2016-01-13 NOTE — Telephone Encounter (Signed)
Left message to give us a call back

## 2016-01-15 ENCOUNTER — Encounter: Payer: Self-pay | Admitting: Internal Medicine

## 2016-01-19 ENCOUNTER — Encounter: Payer: Self-pay | Admitting: Pulmonary Disease

## 2016-01-25 ENCOUNTER — Telehealth: Payer: Self-pay | Admitting: Cardiology

## 2016-01-25 NOTE — Telephone Encounter (Signed)
Patient called regarding an "episode" this morning when she was faced with 5-7 of her neighbor's pitbulls in her front yard.  She reports that they had escaped from his yard and she had to run and call animal control, the police, and her neighbor.  Patient is concerned that her heart was racing during this episode.  Patient sent manual transmission for review.  No episodes from today.  Patient made aware.  She is concerned that the device does not monitor her tachy episodes.  Advised patient that the ILR is programmed to try to pick up specific types of heart rates (abnormal rhythms).  Patient verbalizes understanding.  She is aware of upcoming appointment with Dr. Lovena Le on 01/27/16.

## 2016-01-25 NOTE — Telephone Encounter (Signed)
Pt called and stated that she had a episode this morning and wanted to know what her HR was around 8:00 AM - 8:45 AM. Instructed pt to send a manual remote transmission. Pt verbalized understanding.

## 2016-01-27 ENCOUNTER — Encounter: Payer: Self-pay | Admitting: Internal Medicine

## 2016-01-27 ENCOUNTER — Ambulatory Visit (INDEPENDENT_AMBULATORY_CARE_PROVIDER_SITE_OTHER): Payer: Medicare PPO | Admitting: Internal Medicine

## 2016-01-27 VITALS — BP 136/84 | HR 60 | Ht 63.0 in | Wt 153.4 lb

## 2016-01-27 DIAGNOSIS — I4589 Other specified conduction disorders: Secondary | ICD-10-CM | POA: Diagnosis not present

## 2016-01-27 DIAGNOSIS — R1030 Lower abdominal pain, unspecified: Secondary | ICD-10-CM

## 2016-01-27 DIAGNOSIS — E538 Deficiency of other specified B group vitamins: Secondary | ICD-10-CM | POA: Diagnosis not present

## 2016-01-27 DIAGNOSIS — R110 Nausea: Secondary | ICD-10-CM

## 2016-01-27 DIAGNOSIS — E559 Vitamin D deficiency, unspecified: Secondary | ICD-10-CM | POA: Diagnosis not present

## 2016-01-27 NOTE — Patient Instructions (Addendum)
Medication Instructions:Your physician recommends that you continue on your current medications as directed. Please refer to the Current Medication list given to you today.   Labwork: TODAY: BMET, VItamin D, and Vitamin B12 02/29/16 : CBC and BMET (pre-procedural labs)  Procedures/Testing: Non-Cardiac CT Angiography (CTA), is a special type of CT scan that uses a computer to produce multi-dimensional views of major blood vessels throughout the body. In CT angiography, a contrast material is injected through an IV to help visualize the blood vessels  Your physician has recommended that you have a pacemaker inserted. A pacemaker is a small device that is placed under the skin of your chest or abdomen to help control abnormal heart rhythms. This device uses electrical pulses to prompt the heart to beat at a normal rate. Pacemakers are used to treat heart rhythms that are too slow. Wire (leads) are attached to the pacemaker that goes into the chambers of you heart. This is done in the hospital and usually requires and overnight stay. Please see the instruction sheet given to you today for more information.   Follow-Up: Your physician recommends that you schedule a follow-up appointment in 3 months post implant with Dr. Lovena Le.  Your physician recommends that you schedule a follow-up appointment 2 weeks post implant with the Device Clinic  Implant date 03/03/16  Cancel 02/29/16 appt with Dr. Aundra Dubin and reschedule for mid-late October.  Continue your monthly summary reports with your Loop device.    Any Additional Special Instructions Will Be Listed Below (If Applicable).     If you need a refill on your cardiac medications before your next appointment, please call your pharmacy.

## 2016-01-27 NOTE — Progress Notes (Signed)
HPI Renee [name] returns today for followup. She is a pleasant 70 yo woman with recurrent syncope who had another episode several weeks ago and was found on her monitor to have 7 seconds of asystole. The episode was proceeded by nausea raising a suspicion about vagal mediation. She has a h/o unexplained syncope as well as non-obstructive CAD. She also has palpitations and is not on any sinus or AV nodal blocking agents except for very low dose cardizem which she takes for tachycardia and CAD.  Allergies  Allergen Reactions  . Cyclobenzaprine Other (See Comments)    Pt states that this medication does not work for her.    . Adhesive [Tape] Itching, Rash and Other (See Comments)    Reaction:  Blisters  Pt states that she is allergic to the adhesive backing of EKG pads.     . Moxifloxacin Other (See Comments)    Reaction:  Unknown   . Quinolones Other (See Comments)    Reaction:  Unknown      Current Outpatient Prescriptions  Medication Sig Dispense Refill  . ALPRAZolam (XANAX) 0.5 MG tablet Take 0.5-1 tablets (0.25-0.5 mg total) by mouth 2 (two) times daily as needed for anxiety. 90 tablet 1  . aspirin EC 81 MG tablet Take 81 mg by mouth daily.     Marland Kitchen dicyclomine (BENTYL) 20 MG tablet Take 1 tablet (20 mg total) by mouth 3 (three) times daily before meals. 270 tablet 2  . diltiazem (CARDIZEM CD) 120 MG 24 hr capsule Take 1 capsule (120 mg total) by mouth daily.    . flecainide (TAMBOCOR) 50 MG tablet Take 0.5 tablets (25 mg total) by mouth 2 (two) times daily. 90 tablet 2  . glucose blood (ACCU-CHEK SMARTVIEW) test strip 1 each by Other route 2 (two) times daily. Use to check blood sugars twice a day Dx e11.9 300 each 2  . Lancets (ACCU-CHEK MULTICLIX) lancets 1 each by Other route 2 (two) times daily. Use to help check blood sugars twice a day Dx e11.9 1 each 0  . naproxen sodium (ANAPROX) 220 MG tablet Take 220 mg by mouth 2 (two) times daily as needed. For pain    . nitroGLYCERIN  (NITROSTAT) 0.4 MG SL tablet Place 1 tablet (0.4 mg total) under the tongue every 5 (five) minutes as needed for chest pain. 30 tablet 0  . omeprazole (PRILOSEC) 40 MG capsule Take 1 capsule (40 mg total) by mouth every evening. 90 capsule 2  . potassium chloride (MICRO-K) 10 MEQ CR capsule Take 1 capsule (10 mEq total) by mouth daily. 10 capsule 0  . triamterene-hydrochlorothiazide (DYAZIDE) 37.5-25 MG capsule TAKE 1 CAPSULE EVERY DAY 90 capsule 3   No current facility-administered medications for this visit.      Past Medical History:  Diagnosis Date  . Anxiety   . AV nodal re-entry tachycardia [facility])    a. s/p RFA 1993- Dr. Rolland Porter, [place]  . Broken neck ([place])    1993  . Bronchitis    Recurrent  . Cancer (HCC)    hx of cervical   ca  . Chronic chest pain    a. 2005 Cath: Mild nonobstructive plaque (30-40% LCX);  b. 08/2011 low risk myoview;  c. 09/2011 Coronary CT: NL cors w ca score of 0.  . Chronic pain syndrome   . Complication of anesthesia    blood pressure and heart dropped in 2009    . Concussion    1979, 1993  .  Concussion    1979, 1993  . Constipation, chronic   . Degenerative joint disease    a. s/p L Total Knee Arthroplasty.  . Diet-controlled type 2 diabetes mellitus (HCC)   . Diverticulosis of colon   . Duodenitis without hemorrhage   . Dysphagia   . Fecal incontinence   . Fibromyalgia   . GERD (gastroesophageal reflux disease)   . Hemorrhoids   . Hypertension   . Irritable bowel syndrome   . Long QT interval    a. mild - advised to avoid meds that may prolong QT.  . Neuropathy (HCC)   . Osteoporosis   . PONV (postoperative nausea and vomiting)   . Pre-syncope   . SVT (supraventricular tachycardia) (HCC)    a. nonsustained SVT - previously offered flecainide but refused;  b. 12/2012 30 day event monitor w/o significant arrhythmias.  . Thyroid nodule     ROS:   All systems reviewed and negative except as noted in  the HPI.        Past Surgical History:  Procedure Laterality Date  . ABLATION OF DYSRHYTHMIC FOCUS  1993  . BIOPSY THYROID    . CARDIAC ELECTROPHYSIOLOGY STUDY AND ABLATION     atrioventricular nodal reentant tachycardia  . CHOLECYSTECTOMY  1980  . COLONOSCOPY  06/25/2008   Diverticulosis and Hemorrhoids  . COLPOSCOPY    . CORONARY ANGIOPLASTY  2007  . ESOPHAGOGASTRODUODENOSCOPY    . HYSTEROSCOPY  2002   with resection of endometrial polyps. by Dr.MCPhail  . HYSTEROSCOPY W/D&C N/A 02/05/2015   Procedure: DILATATION AND CURETTAGE /HYSTEROSCOPY;  Surgeon: Kendra Ross, MD;  Location: [facility];  Service: Gynecology;  Laterality: N/A;  . LOOP RECORDER IMPLANT  08/21/2013   MDT LinQ implanted by Dr Taylor for syncope  . LOOP RECORDER IMPLANT N/A 08/21/2013   Procedure: LOOP RECORDER IMPLANT;  Surgeon: Gregg W Taylor, MD;  Location: [facility] CATH LAB;  Service: Cardiovascular;  Laterality: N/A;  . ORIF RADIAL HEAD / NECK FRACTURE  09/05/88  . TILT TABLE STUDY N/A 05/15/2013   Procedure: TILT TABLE STUDY;  Surgeon: Steven C Klein, MD;  Location: [facility] CATH LAB;  Service: Cardiovascular;  Laterality: N/A;  . TOTAL HIP ARTHROPLASTY  10/10   Left, at [facility]           Family History  Problem Relation Age of Onset  . Ovarian cancer Mother   . Stroke Mother   . Diabetes Mother   . Asthma Mother   . Diabetes Sister     Pacemaker, CHF  . Heart disease Sister   . Asthma Sister   . Lung cancer Father     Heart problems  . Heart disease Sister   . Diabetes Brother      Social History        Social History  . Marital status: Single    Spouse name: N/A  . Number of children: 2  . Years of education: 12th       Occupational History  . Disability Unemployed         Social History Main Topics  . Smoking status: Never Smoker  . Smokeless tobacco: Never Used  . Alcohol use Yes     Comment: Occasional   . Drug use: No  . Sexual  activity: Not Currently       Other Topics Concern  . Not on file      Social History Narrative   The patient lives in [place] alone.      60   Ht 5\' 3"  (1.6 m)   Wt 153 lb 6.4 oz (69.6 kg)   BMI 27.17 kg/m   Physical Exam:  Well appearing NAD HEENT: Unremarkable Neck:  No JVD, no thyromegally Lymphatics:  No adenopathy Back:  No CVA tenderness Lungs:  Clear with no wheezes HEART:  Regular rate rhythm, no murmurs, no rubs, no clicks Abd:  soft, positive bowel sounds, no organomegally, no rebound, no guarding Ext:  2 plus pulses, no edema, no cyanosis, no clubbing Skin:  No rashes no nodules Neuro:  CN II through XII intact, motor grossly intact  EKG - NSR with NSSTT changes  DEVICE  Normal device function.  See PaceArt for details. Pause as noted on ILR  Assess/Plan: 1. Recurrent syncope with a documented 7 second daytime pause - Her low dose cardizem is not thought to be a reversible cause of her syncope as she has had syncope prior to taking cardizem. I have offered her insertion of a PPM. She wishes to proceed. 2. Nausea and abdominal pain - her symptoms are unclear and she has had her GB out in the past. I will ask her to undergo contrast CT of the abdomen to look at her pancreas.  3. CAD - she has mostly non-obstructive disease with chronic non-anginal chest pain. 4. HTN - her blood pressure is a bit elevated. Will follow.  Mikle Bosworth.D.

## 2016-01-28 ENCOUNTER — Other Ambulatory Visit: Payer: Medicare PPO | Admitting: *Deleted

## 2016-01-28 DIAGNOSIS — E559 Vitamin D deficiency, unspecified: Secondary | ICD-10-CM

## 2016-01-28 DIAGNOSIS — I4589 Other specified conduction disorders: Secondary | ICD-10-CM

## 2016-01-28 DIAGNOSIS — E538 Deficiency of other specified B group vitamins: Secondary | ICD-10-CM

## 2016-01-28 LAB — CBC WITH DIFFERENTIAL/PLATELET
BASOS PCT: 1 %
Basophils Absolute: 52 cells/uL (ref 0–200)
EOS ABS: 104 {cells}/uL (ref 15–500)
Eosinophils Relative: 2 %
HEMATOCRIT: 38.9 % (ref 35.0–45.0)
HEMOGLOBIN: 13.3 g/dL (ref 11.7–15.5)
LYMPHS ABS: 2340 {cells}/uL (ref 850–3900)
Lymphocytes Relative: 45 %
MCH: 27.7 pg (ref 27.0–33.0)
MCHC: 34.2 g/dL (ref 32.0–36.0)
MCV: 81 fL (ref 80.0–100.0)
MONO ABS: 208 {cells}/uL (ref 200–950)
MPV: 9.4 fL (ref 7.5–12.5)
Monocytes Relative: 4 %
Neutro Abs: 2496 cells/uL (ref 1500–7800)
Neutrophils Relative %: 48 %
Platelets: 266 10*3/uL (ref 140–400)
RBC: 4.8 MIL/uL (ref 3.80–5.10)
RDW: 13.2 % (ref 11.0–15.0)
WBC: 5.2 10*3/uL (ref 3.8–10.8)

## 2016-01-28 LAB — BASIC METABOLIC PANEL
BUN: 9 mg/dL (ref 7–25)
BUN: 9 mg/dL (ref 7–25)
CHLORIDE: 103 mmol/L (ref 98–110)
CHLORIDE: 103 mmol/L (ref 98–110)
CO2: 26 mmol/L (ref 20–31)
CO2: 26 mmol/L (ref 20–31)
Calcium: 10.1 mg/dL (ref 8.6–10.4)
Calcium: 9.9 mg/dL (ref 8.6–10.4)
Creat: 0.77 mg/dL (ref 0.60–0.93)
Creat: 0.8 mg/dL (ref 0.60–0.93)
GLUCOSE: 97 mg/dL (ref 65–99)
GLUCOSE: 97 mg/dL (ref 65–99)
POTASSIUM: 3.8 mmol/L (ref 3.5–5.3)
Potassium: 3.7 mmol/L (ref 3.5–5.3)
Sodium: 139 mmol/L (ref 135–146)
Sodium: 139 mmol/L (ref 135–146)

## 2016-01-28 LAB — VITAMIN B12: VITAMIN B 12: 506 pg/mL (ref 200–1100)

## 2016-01-29 ENCOUNTER — Encounter: Payer: Self-pay | Admitting: Pulmonary Disease

## 2016-01-29 ENCOUNTER — Ambulatory Visit (INDEPENDENT_AMBULATORY_CARE_PROVIDER_SITE_OTHER)
Admission: RE | Admit: 2016-01-29 | Discharge: 2016-01-29 | Disposition: A | Discharge status: Home / Self Care | Payer: Medicare PPO | Source: Ambulatory Visit | Attending: Internal Medicine | Admitting: Internal Medicine

## 2016-01-29 DIAGNOSIS — R1030 Lower abdominal pain, unspecified: Secondary | ICD-10-CM

## 2016-01-29 DIAGNOSIS — R110 Nausea: Secondary | ICD-10-CM

## 2016-01-29 LAB — VITAMIN D 25 HYDROXY (VIT D DEFICIENCY, FRACTURES): Vit D, 25-Hydroxy: 43 ng/mL (ref 30–100)

## 2016-01-29 MED ORDER — IOPAMIDOL (ISOVUE-300) INJECTION 61%
100.0000 mL | Freq: Once | INTRAVENOUS | Status: AC | PRN
  Administered 2016-01-29: 100 mL via INTRAVENOUS

## 2016-02-07 LAB — CUP PACEART REMOTE DEVICE CHECK: Date Time Interrogation Session: 20170802043556

## 2016-02-10 ENCOUNTER — Telehealth: Payer: Self-pay | Admitting: Internal Medicine

## 2016-02-10 NOTE — Telephone Encounter (Signed)
New Message  Pt call requesting to speak with an Therapist, sports. Pt states she felt her heart stop and wants to ask if she needs to go to the hospital. I did advise pt if she felt she needed to call 911 to do so. Please call back to discuss

## 2016-02-10 NOTE — Telephone Encounter (Signed)
Returned call to patient who was noticeably anxious over the phone.  She has not taken her medications this morning and has noticed that her BP has been fluctuating.   Mostly high, last check was 187/105.  She got very anxious when the first reading was high and then it only got higher each time she rechecked it.  She was in a panic when I returned her call.  I have advised her to take her morning medications and try and calm down while they get in her system.  She is also going to check her BS as she became shaky and sweaty prior to taking her BP.  She was somewhat calmer as we hung up.  She will call me back if her BP does not come down to normal.

## 2016-02-12 ENCOUNTER — Ambulatory Visit (INDEPENDENT_AMBULATORY_CARE_PROVIDER_SITE_OTHER): Payer: Medicare PPO | Admitting: *Deleted

## 2016-02-12 DIAGNOSIS — R55 Syncope and collapse: Secondary | ICD-10-CM | POA: Diagnosis not present

## 2016-02-12 NOTE — Progress Notes (Signed)
Carelink Summary Report / Loop Recorder 

## 2016-02-29 ENCOUNTER — Other Ambulatory Visit: Payer: Medicare PPO | Admitting: *Deleted

## 2016-02-29 ENCOUNTER — Ambulatory Visit: Payer: Medicare PPO | Admitting: Cardiology

## 2016-02-29 DIAGNOSIS — I2510 Atherosclerotic heart disease of native coronary artery without angina pectoris: Secondary | ICD-10-CM

## 2016-02-29 DIAGNOSIS — I10 Essential (primary) hypertension: Secondary | ICD-10-CM

## 2016-02-29 LAB — BASIC METABOLIC PANEL
BUN: 15 mg/dL (ref 7–25)
CALCIUM: 10 mg/dL (ref 8.6–10.4)
CO2: 26 mmol/L (ref 20–31)
CREATININE: 0.7 mg/dL (ref 0.60–0.93)
Chloride: 102 mmol/L (ref 98–110)
GLUCOSE: 101 mg/dL — AB (ref 65–99)
Potassium: 3.6 mmol/L (ref 3.5–5.3)
SODIUM: 138 mmol/L (ref 135–146)

## 2016-02-29 LAB — CBC WITH DIFFERENTIAL/PLATELET
BASOS PCT: 1 %
Basophils Absolute: 52 cells/uL (ref 0–200)
EOS ABS: 104 {cells}/uL (ref 15–500)
Eosinophils Relative: 2 %
HEMATOCRIT: 39.3 % (ref 35.0–45.0)
Hemoglobin: 13.4 g/dL (ref 11.7–15.5)
Lymphocytes Relative: 42 %
Lymphs Abs: 2184 cells/uL (ref 850–3900)
MCH: 27.5 pg (ref 27.0–33.0)
MCHC: 34.1 g/dL (ref 32.0–36.0)
MCV: 80.5 fL (ref 80.0–100.0)
MONO ABS: 260 {cells}/uL (ref 200–950)
MPV: 9.5 fL (ref 7.5–12.5)
Monocytes Relative: 5 %
NEUTROS ABS: 2600 {cells}/uL (ref 1500–7800)
Neutrophils Relative %: 50 %
Platelets: 253 10*3/uL (ref 140–400)
RBC: 4.88 MIL/uL (ref 3.80–5.10)
RDW: 13.2 % (ref 11.0–15.0)
WBC: 5.2 10*3/uL (ref 3.8–10.8)

## 2016-03-02 ENCOUNTER — Encounter (HOSPITAL_COMMUNITY): Payer: Self-pay | Admitting: Nurse Practitioner

## 2016-03-03 ENCOUNTER — Ambulatory Visit (HOSPITAL_COMMUNITY)
Admission: RE | Admit: 2016-03-03 | Discharge: 2016-03-04 | Disposition: A | Discharge status: Home / Self Care | Payer: Medicare PPO | Source: Ambulatory Visit | Attending: Internal Medicine | Admitting: Internal Medicine

## 2016-03-03 ENCOUNTER — Encounter (HOSPITAL_COMMUNITY): Payer: Self-pay | Admitting: *Deleted

## 2016-03-03 ENCOUNTER — Encounter (HOSPITAL_COMMUNITY)
Admission: RE | Disposition: A | Discharge status: Home / Self Care | Payer: Self-pay | Source: Ambulatory Visit | Attending: Internal Medicine

## 2016-03-03 DIAGNOSIS — M797 Fibromyalgia: Secondary | ICD-10-CM | POA: Diagnosis not present

## 2016-03-03 DIAGNOSIS — I459 Conduction disorder, unspecified: Secondary | ICD-10-CM | POA: Diagnosis not present

## 2016-03-03 DIAGNOSIS — R109 Unspecified abdominal pain: Secondary | ICD-10-CM | POA: Insufficient documentation

## 2016-03-03 DIAGNOSIS — K589 Irritable bowel syndrome without diarrhea: Secondary | ICD-10-CM | POA: Insufficient documentation

## 2016-03-03 DIAGNOSIS — K219 Gastro-esophageal reflux disease without esophagitis: Secondary | ICD-10-CM | POA: Diagnosis not present

## 2016-03-03 DIAGNOSIS — Z23 Encounter for immunization: Secondary | ICD-10-CM | POA: Diagnosis not present

## 2016-03-03 DIAGNOSIS — G894 Chronic pain syndrome: Secondary | ICD-10-CM | POA: Diagnosis not present

## 2016-03-03 DIAGNOSIS — I2510 Atherosclerotic heart disease of native coronary artery without angina pectoris: Secondary | ICD-10-CM | POA: Insufficient documentation

## 2016-03-03 DIAGNOSIS — I442 Atrioventricular block, complete: Secondary | ICD-10-CM | POA: Diagnosis not present

## 2016-03-03 DIAGNOSIS — Z823 Family history of stroke: Secondary | ICD-10-CM | POA: Insufficient documentation

## 2016-03-03 DIAGNOSIS — Z833 Family history of diabetes mellitus: Secondary | ICD-10-CM | POA: Insufficient documentation

## 2016-03-03 DIAGNOSIS — Z4509 Encounter for adjustment and management of other cardiac device: Secondary | ICD-10-CM | POA: Insufficient documentation

## 2016-03-03 DIAGNOSIS — M1990 Unspecified osteoarthritis, unspecified site: Secondary | ICD-10-CM | POA: Insufficient documentation

## 2016-03-03 DIAGNOSIS — F419 Anxiety disorder, unspecified: Secondary | ICD-10-CM | POA: Diagnosis not present

## 2016-03-03 DIAGNOSIS — Z950 Presence of cardiac pacemaker: Secondary | ICD-10-CM | POA: Diagnosis present

## 2016-03-03 DIAGNOSIS — R110 Nausea: Secondary | ICD-10-CM | POA: Insufficient documentation

## 2016-03-03 DIAGNOSIS — I471 Supraventricular tachycardia: Secondary | ICD-10-CM | POA: Insufficient documentation

## 2016-03-03 DIAGNOSIS — Z8249 Family history of ischemic heart disease and other diseases of the circulatory system: Secondary | ICD-10-CM | POA: Insufficient documentation

## 2016-03-03 DIAGNOSIS — Z7982 Long term (current) use of aspirin: Secondary | ICD-10-CM | POA: Insufficient documentation

## 2016-03-03 DIAGNOSIS — Z96652 Presence of left artificial knee joint: Secondary | ICD-10-CM | POA: Diagnosis not present

## 2016-03-03 DIAGNOSIS — C539 Malignant neoplasm of cervix uteri, unspecified: Secondary | ICD-10-CM | POA: Insufficient documentation

## 2016-03-03 DIAGNOSIS — Z96642 Presence of left artificial hip joint: Secondary | ICD-10-CM | POA: Insufficient documentation

## 2016-03-03 DIAGNOSIS — K5909 Other constipation: Secondary | ICD-10-CM | POA: Diagnosis not present

## 2016-03-03 DIAGNOSIS — R55 Syncope and collapse: Secondary | ICD-10-CM

## 2016-03-03 DIAGNOSIS — I10 Essential (primary) hypertension: Secondary | ICD-10-CM | POA: Insufficient documentation

## 2016-03-03 DIAGNOSIS — E1140 Type 2 diabetes mellitus with diabetic neuropathy, unspecified: Secondary | ICD-10-CM | POA: Insufficient documentation

## 2016-03-03 DIAGNOSIS — Z801 Family history of malignant neoplasm of trachea, bronchus and lung: Secondary | ICD-10-CM | POA: Insufficient documentation

## 2016-03-03 DIAGNOSIS — Z8041 Family history of malignant neoplasm of ovary: Secondary | ICD-10-CM | POA: Insufficient documentation

## 2016-03-03 HISTORY — PX: EP IMPLANTABLE DEVICE: SHX172B

## 2016-03-03 LAB — GLUCOSE, CAPILLARY
GLUCOSE-CAPILLARY: 129 mg/dL — AB (ref 65–99)
GLUCOSE-CAPILLARY: 95 mg/dL (ref 65–99)

## 2016-03-03 LAB — SURGICAL PCR SCREEN
MRSA, PCR: NEGATIVE
STAPHYLOCOCCUS AUREUS: NEGATIVE

## 2016-03-03 SURGERY — PACEMAKER IMPLANT

## 2016-03-03 MED ORDER — SODIUM CHLORIDE 0.9 % IR SOLN
80.0000 mg | Status: AC
  Administered 2016-03-03: 80 mg
  Filled 2016-03-03: qty 2

## 2016-03-03 MED ORDER — SODIUM CHLORIDE 0.9 % IR SOLN
Status: AC
  Filled 2016-03-03: qty 2

## 2016-03-03 MED ORDER — MUPIROCIN 2 % EX OINT
1.0000 "application " | TOPICAL_OINTMENT | Freq: Once | CUTANEOUS | Status: AC
  Administered 2016-03-03: 1 via TOPICAL
  Filled 2016-03-03: qty 22

## 2016-03-03 MED ORDER — GLUCOSE BLOOD VI STRP: 1.0000 | ORAL_STRIP | Freq: Two times a day (BID) | Status: DC

## 2016-03-03 MED ORDER — TRIAMTERENE-HCTZ 37.5-25 MG PO CAPS
1.0000 | ORAL_CAPSULE | Freq: Every day | ORAL | Status: DC
  Filled 2016-03-03: qty 1

## 2016-03-03 MED ORDER — CEFAZOLIN IN D5W 1 GM/50ML IV SOLN: 1.0000 g | Freq: Four times a day (QID) | INTRAVENOUS | Status: DC

## 2016-03-03 MED ORDER — CEFAZOLIN IN D5W 1 GM/50ML IV SOLN
1.0000 g | Freq: Four times a day (QID) | INTRAVENOUS | Status: AC
  Administered 2016-03-03 – 2016-03-04 (×3): 1 g via INTRAVENOUS
  Filled 2016-03-03 (×3): qty 50

## 2016-03-03 MED ORDER — ALPRAZOLAM 0.25 MG PO TABS
0.2500 mg | ORAL_TABLET | Freq: Two times a day (BID) | ORAL | Status: DC | PRN
  Administered 2016-03-03: 0.25 mg via ORAL
  Filled 2016-03-03: qty 1

## 2016-03-03 MED ORDER — ASPIRIN EC 81 MG PO TBEC
81.0000 mg | DELAYED_RELEASE_TABLET | Freq: Every morning | ORAL | Status: DC
  Administered 2016-03-03 – 2016-03-04 (×2): 81 mg via ORAL
  Filled 2016-03-03 (×2): qty 1

## 2016-03-03 MED ORDER — CEFAZOLIN SODIUM-DEXTROSE 2-4 GM/100ML-% IV SOLN
2.0000 g | INTRAVENOUS | Status: AC
  Administered 2016-03-03: 2 g via INTRAVENOUS
  Filled 2016-03-03: qty 100

## 2016-03-03 MED ORDER — SODIUM CHLORIDE 0.9 % IV SOLN
INTRAVENOUS | Status: DC
  Administered 2016-03-03: 07:00:00 via INTRAVENOUS

## 2016-03-03 MED ORDER — ACETAMINOPHEN 325 MG PO TABS: 325.0000 mg | ORAL_TABLET | ORAL | Status: DC | PRN

## 2016-03-03 MED ORDER — ONDANSETRON HCL 4 MG/2ML IJ SOLN: 4.0000 mg | Freq: Four times a day (QID) | INTRAMUSCULAR | Status: DC | PRN

## 2016-03-03 MED ORDER — CHLORHEXIDINE GLUCONATE 4 % EX LIQD
60.0000 mL | Freq: Once | CUTANEOUS | Status: DC
  Filled 2016-03-03: qty 60

## 2016-03-03 MED ORDER — NITROGLYCERIN 0.4 MG SL SUBL: 0.4000 mg | SUBLINGUAL_TABLET | SUBLINGUAL | Status: DC | PRN

## 2016-03-03 MED ORDER — LIDOCAINE HCL (PF) 1 % IJ SOLN
INTRAMUSCULAR | Status: DC | PRN
Start: 2016-03-03 — End: 2016-03-03
  Administered 2016-03-03: 40 mL

## 2016-03-03 MED ORDER — LIDOCAINE HCL (PF) 1 % IJ SOLN
INTRAMUSCULAR | Status: AC
  Filled 2016-03-03: qty 60

## 2016-03-03 MED ORDER — MUPIROCIN 2 % EX OINT
TOPICAL_OINTMENT | CUTANEOUS | Status: AC
  Administered 2016-03-03: 1 via TOPICAL
  Filled 2016-03-03: qty 22

## 2016-03-03 MED ORDER — TRIAMTERENE-HCTZ 37.5-25 MG PO TABS
1.0000 | ORAL_TABLET | Freq: Every day | ORAL | Status: DC
  Administered 2016-03-03 – 2016-03-04 (×2): 1 via ORAL
  Filled 2016-03-03 (×2): qty 1

## 2016-03-03 MED ORDER — DILTIAZEM HCL ER COATED BEADS 120 MG PO CP24
120.0000 mg | ORAL_CAPSULE | ORAL | Status: DC
  Administered 2016-03-04: 120 mg via ORAL
  Filled 2016-03-03: qty 1

## 2016-03-03 MED ORDER — MIDAZOLAM HCL 5 MG/5ML IJ SOLN
INTRAMUSCULAR | Status: AC
  Filled 2016-03-03: qty 5

## 2016-03-03 MED ORDER — FENTANYL CITRATE (PF) 100 MCG/2ML IJ SOLN
INTRAMUSCULAR | Status: AC
  Filled 2016-03-03: qty 2

## 2016-03-03 MED ORDER — ACETAMINOPHEN 325 MG PO TABS
325.0000 mg | ORAL_TABLET | ORAL | Status: DC | PRN
  Administered 2016-03-03: 650 mg via ORAL
  Filled 2016-03-03: qty 2

## 2016-03-03 MED ORDER — ACCU-CHEK MULTICLIX LANCETS MISC: 1.0000 | Freq: Two times a day (BID) | Status: DC

## 2016-03-03 MED ORDER — HEPARIN (PORCINE) IN NACL 2-0.9 UNIT/ML-% IJ SOLN
INTRAMUSCULAR | Status: DC | PRN
  Administered 2016-03-03: 10:00:00

## 2016-03-03 MED ORDER — FLECAINIDE ACETATE 50 MG PO TABS
25.0000 mg | ORAL_TABLET | Freq: Two times a day (BID) | ORAL | Status: DC
  Administered 2016-03-03 – 2016-03-04 (×2): 25 mg via ORAL
  Filled 2016-03-03 (×2): qty 1

## 2016-03-03 MED ORDER — MIDAZOLAM HCL 5 MG/5ML IJ SOLN
INTRAMUSCULAR | Status: DC | PRN
  Administered 2016-03-03: 1 mg via INTRAVENOUS
  Administered 2016-03-03: 2 mg via INTRAVENOUS
  Administered 2016-03-03: 1 mg via INTRAVENOUS

## 2016-03-03 MED ORDER — FENTANYL CITRATE (PF) 100 MCG/2ML IJ SOLN
INTRAMUSCULAR | Status: DC | PRN
  Administered 2016-03-03: 12.5 ug via INTRAVENOUS
  Administered 2016-03-03 (×2): 25 ug via INTRAVENOUS

## 2016-03-03 MED ORDER — INFLUENZA VAC SPLIT QUAD 0.5 ML IM SUSY
0.5000 mL | PREFILLED_SYRINGE | INTRAMUSCULAR | Status: AC
  Administered 2016-03-04: 0.5 mL via INTRAMUSCULAR
  Filled 2016-03-03: qty 0.5

## 2016-03-03 MED ORDER — HEPARIN (PORCINE) IN NACL 2-0.9 UNIT/ML-% IJ SOLN
INTRAMUSCULAR | Status: AC
  Filled 2016-03-03: qty 500

## 2016-03-03 MED ORDER — CEFAZOLIN SODIUM-DEXTROSE 2-4 GM/100ML-% IV SOLN
INTRAVENOUS | Status: AC
  Filled 2016-03-03: qty 100

## 2016-03-03 SURGICAL SUPPLY — 8 items
CABLE SURGICAL S-101-97-12 (CABLE) ×2 IMPLANT
GUIDEWIRE ANGLED .035X150CM (WIRE) ×2 IMPLANT
LEAD SOLIA S PRO MRI 45 (Lead) ×2 IMPLANT
LEAD SOLIA S PRO MRI 53 (Lead) ×2 IMPLANT
PACEMAKER EDORA 8DR-T MRI (Pacemaker) ×2 IMPLANT
PAD DEFIB LIFELINK (PAD) ×2 IMPLANT
SHEATH CLASSIC 7F (SHEATH) ×4 IMPLANT
TRAY PACEMAKER INSERTION (PACKS) ×2 IMPLANT

## 2016-03-03 NOTE — Discharge Instructions (Signed)
° ° °  Supplemental Discharge Instructions for  Pacemaker/Defibrillator Patients  Activity No heavy lifting or vigorous activity with your left/right arm for 6 to 8 weeks.  Do not raise your left/right arm above your head for one week.  Gradually raise your affected arm as drawn below.           __       03/07/16                      03/08/16                  03/09/16                   03/10/16  NO DRIVING for  1 week   ; you may begin driving on   [id number]  .  WOUND CARE - Keep the wound area clean and dry.  Do not get this area wet for one week. No showers for one week; you may shower on 03/10/16    . - The tape/steri-strips on your wound will fall off; do not pull them off.  No bandage is needed on the site.  DO  NOT apply any creams, oils, or ointments to the wound area. - If you notice any drainage or discharge from the wound, any swelling or bruising at the site, or you develop a fever > 101? F after you are discharged home, call the office at once.  Special Instructions - You are still able to use cellular telephones; use the ear opposite the side where you have your pacemaker/defibrillator.  Avoid carrying your cellular phone near your device. - When traveling through airports, show security personnel your identification card to avoid being screened in the metal detectors.  Ask the security personnel to use the hand wand. - Avoid arc welding equipment, MRI testing (magnetic resonance imaging), TENS units (transcutaneous nerve stimulators).  Call the office for questions about other devices. - Avoid electrical appliances that are in poor condition or are not properly grounded. - Microwave ovens are safe to be near or to operate.
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
## 2016-03-03 NOTE — Discharge Summary (Signed)
ELECTROPHYSIOLOGY PROCEDURE DISCHARGE SUMMARY    Patient ID: Renee [name],  MRN: [id number], DOB/AGE: 1945-08-17 70 y.o.  Admit date: 03/03/2016 Discharge date: 03/04/2016  Primary Care Physician: Cathlean Cower, MD Primary Cardiologist: Aundra Dubin Electrophysiologist: Lovena Le  Primary Discharge Diagnosis:  Symptomatic asystole due to complete heart block, status post pacemaker implantation this admission  Secondary Discharge Diagnosis:  1.  AVNRT s/p prior ablation 2.  Cervical cancer 3.  Diet controlled diabetes 4.  CAD 5.  HTN  Allergies  Allergen Reactions  . Cyclobenzaprine Other (See Comments)    Pt states that this medication does not work for her.    . Adhesive [Tape] Itching, Rash and Other (See Comments)    Reaction:  Blisters  Pt states that she is allergic to the adhesive backing of EKG pads.     . Moxifloxacin Other (See Comments)    Reaction:  Unknown   . Quinolones Other (See Comments)    Reaction:  Unknown      Procedures This Admission:  1.  Implantation of a Biotronik dual chamber PPM on 03/03/16 by Dr Lovena Le.  See op note for full details. The implantable loop recorder was also removed. There were no immediate post procedure complications. 2.  CXR on 03/04/16 demonstrated no pneumothorax status post device implantation.   Brief HPI: Renee [name] is a 70 y.o. female was referred to electrophysiology in the outpatient setting for evaluation of syncope. She had an ILR implanted that demonstrated asystole with recurrent syncope.  Risks, benefits, and alternatives to PPM implantation were reviewed with the patient who wished to proceed.   Hospital Course:  The patient was admitted and underwent implantation of a Biotronik dual chamber pacemaker with details as outlined above.  She  was monitored on telemetry overnight which demonstrated sinus rhythm.  Left chest was without hematoma or ecchymosis.  The device was interrogated and found to be functioning  normally.  CXR was obtained and demonstrated no pneumothorax status post device implantation.  Wound care, arm mobility, and restrictions were reviewed with the patient.  The patient was examined by Dr Lovena Le and considered stable for discharge to home.    Physical Exam: Vitals:   03/03/16 1155 03/03/16 1228 03/03/16 1926 03/04/16 0624  BP: 125/61 115/84 (!) 115/54 120/74  Pulse: 64 74 63 75  Resp: (!) 21 20 20 18   Temp:  98 F (36.7 C) 98.7 F (37.1 C)   TempSrc:  Oral Oral   SpO2: 99% 100% 95% 100%  Weight:      Height:        Labs:   Lab Results  Component Value Date   WBC 5.2 02/29/2016   HGB 13.4 02/29/2016   HCT 39.3 02/29/2016   MCV 80.5 02/29/2016   PLT 253 02/29/2016     Recent Labs Lab 02/29/16 1240  NA 138  K 3.6  CL 102  CO2 26  BUN 15  CREATININE 0.70  CALCIUM 10.0  GLUCOSE 101*    Discharge Medications:    Medication List    TAKE these medications   accu-chek multiclix lancets 1 each by Other route 2 (two) times daily. Use to help check blood sugars twice a day Dx e11.9   ALPRAZolam 0.5 MG tablet Commonly known as:  XANAX Take 0.5-1 tablets (0.25-0.5 mg total) by mouth 2 (two) times daily as needed for anxiety.   aspirin EC 81 MG tablet Take 81 mg by mouth every morning.   dicyclomine 20 MG  tablet Commonly known as:  BENTYL Take 1 tablet (20 mg total) by mouth 3 (three) times daily before meals. What changed:  when to take this  reasons to take this   diltiazem 120 MG 24 hr capsule Commonly known as:  CARDIZEM CD Take 120 mg by mouth every morning.   flecainide 50 MG tablet Commonly known as:  TAMBOCOR Take 0.5 tablets (25 mg total) by mouth 2 (two) times daily.   glucose blood test strip Commonly known as:  ACCU-CHEK SMARTVIEW 1 each by Other route 2 (two) times daily. Use to check blood sugars twice a day Dx e11.9   naproxen sodium 220 MG tablet Commonly known as:  ANAPROX Take 220 mg by mouth 2 (two) times daily as  needed. For pain   nitroGLYCERIN 0.4 MG SL tablet Commonly known as:  NITROSTAT Place 1 tablet (0.4 mg total) under the tongue every 5 (five) minutes as needed for chest pain.   omeprazole 40 MG capsule Commonly known as:  PRILOSEC Take 1 capsule (40 mg total) by mouth every evening.   potassium chloride 10 MEQ CR capsule Commonly known as:  MICRO-K Take 1 capsule (10 mEq total) by mouth daily.   triamterene-hydrochlorothiazide 37.5-25 MG capsule Commonly known as:  DYAZIDE TAKE 1 CAPSULE EVERY DAY       Disposition:  Discharge Instructions    Diet - low sodium heart healthy    Complete by:  As directed    Increase activity slowly    Complete by:  As directed      Follow-up Information    [facility] Follow up on 03/17/2016.   Specialty:  Cardiology Why:  at [facility] for wound check  Contact information: [address] [place]       Cristopher Peru, MD Follow up on 06/09/2016.   Specialty:  Cardiology Why:  at 1:45PM Contact information: [address] [address] [phone]           Duration of Discharge Encounter: Greater than 30 minutes including physician time.  Signed, Chanetta Marshall, NP 03/04/2016 7:53 AM  EP Attending  Patient seen and examined. Agree with above. She is stable for DC home with usual followup.  Mikle Bosworth.D.

## 2016-03-03 NOTE — Interval H&P Note (Signed)
History and Physical Interval Note:  03/03/2016 7:12 AM  Renee [name]  has presented today for surgery, with the diagnosis of av node disfunction  The various methods of treatment have been discussed with the patient and family. After consideration of risks, benefits and other options for treatment, the patient has consented to  Procedure(s): Pacemaker Implant (N/A) as a surgical intervention .  The patient's history has been reviewed, patient examined, no change in status, stable for surgery.  I have reviewed the patient's chart and labs.  Questions were answered to the patient's satisfaction.     Cristopher Peru

## 2016-03-04 ENCOUNTER — Encounter (HOSPITAL_COMMUNITY): Payer: Self-pay | Admitting: Internal Medicine

## 2016-03-04 ENCOUNTER — Ambulatory Visit (HOSPITAL_COMMUNITY): Payer: Medicare PPO

## 2016-03-04 DIAGNOSIS — Z4509 Encounter for adjustment and management of other cardiac device: Secondary | ICD-10-CM | POA: Diagnosis not present

## 2016-03-04 DIAGNOSIS — Z950 Presence of cardiac pacemaker: Secondary | ICD-10-CM

## 2016-03-04 DIAGNOSIS — I442 Atrioventricular block, complete: Secondary | ICD-10-CM | POA: Diagnosis not present

## 2016-03-04 DIAGNOSIS — I459 Conduction disorder, unspecified: Secondary | ICD-10-CM | POA: Diagnosis not present

## 2016-03-04 DIAGNOSIS — I2510 Atherosclerotic heart disease of native coronary artery without angina pectoris: Secondary | ICD-10-CM | POA: Diagnosis not present

## 2016-03-04 DIAGNOSIS — E1140 Type 2 diabetes mellitus with diabetic neuropathy, unspecified: Secondary | ICD-10-CM | POA: Diagnosis not present

## 2016-03-04 NOTE — Progress Notes (Addendum)
Patient in stable condition , this RN completed discharge education with patient, they verbalised understanding, iv removed, tele dc ccmd notified, patient belongings at bedside, patient taken off the unit on a wheelchair by a volunteer

## 2016-03-07 LAB — CUP PACEART REMOTE DEVICE CHECK: MDC IDC SESS DTM: 20170901050854

## 2016-03-08 ENCOUNTER — Encounter (HOSPITAL_COMMUNITY): Payer: Self-pay | Admitting: Internal Medicine

## 2016-03-09 ENCOUNTER — Other Ambulatory Visit: Payer: Self-pay | Admitting: Internal Medicine

## 2016-03-09 DIAGNOSIS — E049 Nontoxic goiter, unspecified: Secondary | ICD-10-CM

## 2016-03-16 ENCOUNTER — Ambulatory Visit
Admission: RE | Admit: 2016-03-16 | Discharge: 2016-03-16 | Disposition: A | Discharge status: Home / Self Care | Payer: Medicare PPO | Source: Ambulatory Visit | Attending: Internal Medicine | Admitting: Internal Medicine

## 2016-03-16 DIAGNOSIS — E049 Nontoxic goiter, unspecified: Secondary | ICD-10-CM

## 2016-03-17 ENCOUNTER — Ambulatory Visit (INDEPENDENT_AMBULATORY_CARE_PROVIDER_SITE_OTHER): Payer: Medicare PPO | Admitting: *Deleted

## 2016-03-17 DIAGNOSIS — R55 Syncope and collapse: Secondary | ICD-10-CM | POA: Diagnosis not present

## 2016-03-18 LAB — CUP PACEART INCLINIC DEVICE CHECK
Battery Remaining Percentage: 100 %
Brady Statistic RV Percent Paced: 0 %
Date Time Interrogation Session: 20171006181409
Implantable Lead Implant Date: 20170921
Implantable Lead Model: 377
Lead Channel Impedance Value: 526 Ohm
Lead Channel Pacing Threshold Amplitude: 0.6 V
Lead Channel Pacing Threshold Pulse Width: 0.4 ms
MDC IDC LEAD IMPLANT DT: 20170921
MDC IDC LEAD LOCATION: 753859
MDC IDC LEAD LOCATION: 753860
MDC IDC LEAD SERIAL: 49575519
MDC IDC LEAD SERIAL: 49580836
MDC IDC MSMT BATTERY REMAINING LONGEVITY: 92 mo
MDC IDC MSMT LEADCHNL RA SENSING INTR AMPL: 2.3 mV
MDC IDC MSMT LEADCHNL RV IMPEDANCE VALUE: 546 Ohm
MDC IDC MSMT LEADCHNL RV PACING THRESHOLD AMPLITUDE: 0.6 V
MDC IDC MSMT LEADCHNL RV PACING THRESHOLD PULSEWIDTH: 0.4 ms
MDC IDC MSMT LEADCHNL RV SENSING INTR AMPL: 11.3 mV
MDC IDC PG SERIAL: 68850891
MDC IDC SET LEADCHNL RA PACING AMPLITUDE: 3 V
MDC IDC SET LEADCHNL RV PACING AMPLITUDE: 3 V
MDC IDC SET LEADCHNL RV PACING PULSEWIDTH: 0.4 ms
MDC IDC STAT BRADY RA PERCENT PACED: 64 %

## 2016-03-18 NOTE — Progress Notes (Signed)
Wound check appointment. Steri-strips removed. Wound without redness or edema. Incision edges approximated, wound well healed. Normal device function (Interrogated by industry). Thresholds, sensing, and impedances consistent with implant measurements. Device programmed at 3.0V for extra safety margin until 3 month visit. Histogram distribution appropriate for patient and level of activity. No mode switches or high ventricular rates noted. Patient educated about wound care, arm mobility, lifting restrictions. ROV in 3 months with GT.

## 2016-03-31 ENCOUNTER — Encounter: Payer: Self-pay | Admitting: Cardiology

## 2016-04-06 ENCOUNTER — Telehealth: Payer: Self-pay | Admitting: *Deleted

## 2016-04-06 DIAGNOSIS — Z950 Presence of cardiac pacemaker: Secondary | ICD-10-CM

## 2016-04-06 NOTE — Telephone Encounter (Signed)
Called patient regarding alert received from home monitor overnight which indicates P-waves of 0.30mV, decreased from daily mean of 2.38mV previously.  Threshold and impedance stable.  Advised patient that a CXR is indicated based on this change.  Patient is agreeable to this plan.  She reports that she "felt something" in her heart the other day, but denies chest discomfort, ShOB, or other symptoms.  Reassured patient that she will not feel different as a result of this test and that there can be an out-of-range measurement without there being a lead issue.  Patient is appreciative of explanation and is agreeable to having CXR at [facility] tomorrow, 04/06/16.  She is aware that Dr. Lovena Le will review the CXR when he is back in the office and we will call her with the results.  Patient agrees to call us back with further questions or concerns.

## 2016-04-07 ENCOUNTER — Ambulatory Visit
Admission: RE | Admit: 2016-04-07 | Discharge: 2016-04-07 | Disposition: A | Discharge status: Home / Self Care | Payer: Medicare PPO | Source: Ambulatory Visit | Attending: Internal Medicine | Admitting: Internal Medicine

## 2016-04-07 DIAGNOSIS — Z950 Presence of cardiac pacemaker: Secondary | ICD-10-CM

## 2016-04-07 NOTE — Telephone Encounter (Signed)
Follow Up:; ° ° °Returning your call. °

## 2016-04-07 NOTE — Telephone Encounter (Signed)
Returned patient's call.  She states that she has received two calls from our office this morning and she is returning these calls.  Advised patient that there are no notes about [place] calling her, but that since she has an appointment with Dr. Aundra Dubin on 10/31, she could've been receiving confirmation calls.  Patient verbalizes understanding.  She states that she is on her way to [place] right now for her CXR.  Patient states that she has been feeling "great" since her PPM was implanted, but that for the past 3 days she has been feeling more weak and ShOB.  Advised patient to keep f/u appointment with Dr. Aundra Dubin.  She is agreeable to this plan and is appreciative of call.

## 2016-04-08 ENCOUNTER — Telehealth: Payer: Self-pay | Admitting: Internal Medicine

## 2016-04-08 NOTE — Telephone Encounter (Signed)
New message     Pt verbalized that she wants the rn to call her for the Xray results

## 2016-04-12 ENCOUNTER — Ambulatory Visit (INDEPENDENT_AMBULATORY_CARE_PROVIDER_SITE_OTHER): Payer: Medicare PPO | Admitting: Cardiology

## 2016-04-12 ENCOUNTER — Encounter: Payer: Self-pay | Admitting: Cardiology

## 2016-04-12 VITALS — BP 154/90 | HR 69 | Ht 63.0 in | Wt 156.4 lb

## 2016-04-12 DIAGNOSIS — Z950 Presence of cardiac pacemaker: Secondary | ICD-10-CM

## 2016-04-12 DIAGNOSIS — R55 Syncope and collapse: Secondary | ICD-10-CM

## 2016-04-12 NOTE — Telephone Encounter (Signed)
LMOVM advising that lead sensing trend has returned to normal per her previous trends.  Explained that after Dr. Lovena Le reviews the CXR, we will call her with results.  [facility] phone number for additional questions or concerns at this time.

## 2016-04-12 NOTE — Telephone Encounter (Signed)
Follow up  Pt voiced she needing someone to call her back about her x-ray results and she's concerned because it's been since 10/26 her last appointment.  Please f/u

## 2016-04-12 NOTE — Patient Instructions (Signed)
Medication Instructions:  Your physician recommends that you continue on your current medications as directed. Please refer to the Current Medication list given to you today.  Labwork: None   Testing/Procedures: None   Follow-Up: Your physician wants you to follow-up in: 6 months with Dr Aundra Dubin [facility]. (April 2018). You will receive a reminder letter in the mail two months in advance. If you don't receive a letter, please call our office to schedule the follow-up appointment.        If you need a refill on your cardiac medications before your next appointment, please call your pharmacy.

## 2016-04-12 NOTE — Telephone Encounter (Signed)
Follow Up:.    Pt says she was still waiting to hear something.
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
## 2016-04-15 ENCOUNTER — Telehealth: Payer: Self-pay | Admitting: Cardiology

## 2016-04-15 NOTE — Telephone Encounter (Signed)
Pt called and stated that she is not feeling well. Her rate is 107 BPM. She is short of breath again, and weak in the knees. Call forwarded to device clinic nurse.

## 2016-04-15 NOTE — Telephone Encounter (Addendum)
[facility] requesting call back.  [facility] phone number.  Per Dr. Lovena Le, increase fluids and salt intake.  CXR normal, RA lead position unchanged per Dr. Lovena Le.

## 2016-04-15 NOTE — Telephone Encounter (Signed)
Spoke with patient.  She has not checked her HR manually, but she is "sure that it is racing".  Patient reports that "it only races when I get up and move around and my knees get weak".  Patient reports B/P at rest is running 120s/70s.  Nightly automatic Home Monitoring transmission indicates no rhythm abnormalities, HR variability stable, mean HR 74bpm.  Thoracic impedance increasing.  Advised patient to try increasing her fluids and salt intake this morning as she may be dehydrated.  She is taking her medications, including flecainide and diltiazem, as instructed.  Patient requests to see Dr. Lovena Le.  Advised patient that he does not have any openings today, but that I will review her symptoms with him for additional recommendations.  She is aware that I will call her back later this morning.  She also requests CXR results.  Will ask Dr. Lovena Le to review prior to calling patient back.

## 2016-04-18 NOTE — Telephone Encounter (Signed)
Pt called and requested that you call her back before you leave today.

## 2016-04-18 NOTE — Telephone Encounter (Signed)
Spoke with patient.  Advised her of Dr. Tanna Furry recommendations and CXR results.  Patient verbalizes understanding and appreciation.  She denies additional questions or concerns at this time.

## 2016-04-21 ENCOUNTER — Telehealth: Payer: Self-pay | Admitting: Cardiology

## 2016-04-21 NOTE — Telephone Encounter (Signed)
Pt called and stated that she "felt something crazy" happen with her heart this morning around 2:45-2:50 AM. Device tech reviewed remote transmission that was received today. Transmission normal. No episodes noting abnormal. Pt still concerned b/c of what she felt but verbalized understanding that her remote transmission from today was normal.

## 2016-05-17 ENCOUNTER — Ambulatory Visit (INDEPENDENT_AMBULATORY_CARE_PROVIDER_SITE_OTHER): Payer: Medicare PPO | Admitting: Internal Medicine

## 2016-05-17 ENCOUNTER — Encounter: Payer: Self-pay | Admitting: Internal Medicine

## 2016-05-17 VITALS — BP 140/80 | HR 97 | Temp 98.8°F | Resp 20 | Wt 161.0 lb

## 2016-05-17 DIAGNOSIS — J069 Acute upper respiratory infection, unspecified: Secondary | ICD-10-CM | POA: Diagnosis not present

## 2016-05-17 DIAGNOSIS — I10 Essential (primary) hypertension: Secondary | ICD-10-CM | POA: Diagnosis not present

## 2016-05-17 MED ORDER — CEFUROXIME AXETIL 250 MG PO TABS: 250.0000 mg | ORAL_TABLET | Freq: Two times a day (BID) | ORAL | 0 refills | Status: AC

## 2016-05-17 MED ORDER — HYDROCOD POLST-CPM POLST ER 10-8 MG/5ML PO SUER: 5.0000 mL | Freq: Two times a day (BID) | ORAL | 0 refills | Status: DC | PRN

## 2016-05-17 NOTE — Progress Notes (Signed)
Subjective:    Patient ID: Renee [name], female    DOB: 1945/07/29, 70 y.o.   MRN: [id number]  HPI   Here with 2-3 days acute onset fever, facial pain, pressure, headache, general weakness and malaise, and greenish d/c, with mild ST after also 4 wks dry cough, but pt denies chest pain, wheezing, increased sob or doe, orthopnea, PND, increased LE swelling, palpitations, dizziness or syncope. Pt denies new neurological symptoms such as new headache, or facial or extremity weakness or numbness   Pt denies polydipsia, polyuria Past Medical History:  Diagnosis Date  . Anxiety   . AV nodal re-entry tachycardia [facility])    a. s/p RFA 1993- Dr. [place] Porter, [place]  . Broken neck ([place])    1993  . Cancer (HCC)    hx of cervical   ca  . Chronic chest pain    a. 2005 Cath: Mild nonobstructive plaque (30-40% LCX);  b. 08/2011 low risk myoview;  c. 09/2011 Coronary CT: NL cors w ca score of 0.  . Chronic pain syndrome   . Complication of anesthesia    blood pressure and heart dropped in 2009    . Concussion    1979, 1993  . Degenerative joint disease    a. s/p L Total Knee Arthroplasty.  . Diet-controlled type 2 diabetes mellitus ([place])   . Diverticulosis of colon   . Duodenitis without hemorrhage   . Fibromyalgia   . GERD (gastroesophageal reflux disease)   . Hemorrhoids   . Hypertension   . Irritable bowel syndrome   . Long QT interval    a. mild - advised to avoid meds that may prolong QT.  Marland Kitchen Neuropathy ([place])   . Osteoporosis   . SVT (supraventricular tachycardia) (HCC)    a. nonsustained SVT - previously offered flecainide but refused;  b. 12/2012 30 day event monitor w/o significant arrhythmias.  . Thyroid nodule    Past Surgical History:  Procedure Laterality Date  . ABLATION OF DYSRHYTHMIC FOCUS  1993  . BIOPSY THYROID    . CARDIAC ELECTROPHYSIOLOGY STUDY AND ABLATION     atrioventricular nodal reentant tachycardia  . CHOLECYSTECTOMY  1980  . COLONOSCOPY  06/25/2008   Diverticulosis  and Hemorrhoids  . COLPOSCOPY    . CORONARY ANGIOPLASTY  2007  . EP IMPLANTABLE DEVICE N/A 03/03/2016   Procedure: Pacemaker Implant;  Surgeon: Evans Lance, MD;  Location: [facility];  Service: Cardiovascular;  Laterality: N/A;  . ESOPHAGOGASTRODUODENOSCOPY    . HYSTEROSCOPY  2002   with resection of endometrial polyps. by Dr.MCPhail  . HYSTEROSCOPY W/D&C N/A 02/05/2015   Procedure: DILATATION AND CURETTAGE /HYSTEROSCOPY;  Surgeon: Vanessa Kick, MD;  Location: [facility];  Service: Gynecology;  Laterality: N/A;  . LOOP RECORDER IMPLANT  08/21/2013   MDT LinQ implanted by Dr Lovena Le for syncope  . LOOP RECORDER IMPLANT N/A 08/21/2013   Procedure: LOOP RECORDER IMPLANT;  Surgeon: Evans Lance, MD;  Location: [facility] C[facility] LAB;  Service: Cardiovascular;  Laterality: N/A;  . ORIF RADIAL HEAD / NECK FRACTURE  09/05/88  . TILT TABLE STUDY N/A 05/15/2013   Procedure: TILT TABLE STUDY;  Surgeon: Deboraha Sprang, MD;  Location: [facility] CATH LAB;  Service: Cardiovascular;  Laterality: N/A;  . TOTAL HIP ARTHROPLASTY  10/10   Left, at [facility]    reports that she has never smoked. She has never used smokeless tobacco. She reports that she drinks alcohol. She reports that she does not use drugs.  family history includes Asthma in her mother and sister; Diabetes in her brother, mother, and sister; Heart disease in her sister and sister; Lung cancer in her father; Ovarian cancer in her mother; Stroke in her mother. Allergies  Allergen Reactions  . Cyclobenzaprine Other (See Comments)    Pt states that this medication does not work for her.    . Adhesive [Tape] Itching, Rash and Other (See Comments)    Reaction:  Blisters  Pt states that she is allergic to the adhesive backing of EKG pads.     . Moxifloxacin Other (See Comments)    Reaction:  Unknown   . Quinolones Other (See Comments)    Reaction:  Unknown    Current Outpatient Prescriptions on File Prior to Visit  Medication Sig Dispense Refill  .  ALPRAZolam (XANAX) 0.5 MG tablet Take 0.5-1 tablets (0.25-0.5 mg total) by mouth 2 (two) times daily as needed for anxiety. 90 tablet 1  . aspirin EC 81 MG tablet Take 81 mg by mouth every morning.     . diltiazem (CARDIZEM CD) 120 MG 24 hr capsule Take 120 mg by mouth every morning.     . flecainide (TAMBOCOR) 50 MG tablet Take 0.5 tablets (25 mg total) by mouth 2 (two) times daily. 90 tablet 2  . glucose blood (ACCU-CHEK SMARTVIEW) test strip 1 each by Other route 2 (two) times daily. Use to check blood sugars twice a day Dx e11.9 300 each 2  . Lancets (ACCU-CHEK MULTICLIX) lancets 1 each by Other route 2 (two) times daily. Use to help check blood sugars twice a day Dx e11.9 1 each 0  . naproxen sodium (ANAPROX) 220 MG tablet Take 220 mg by mouth 2 (two) times daily as needed. For pain    . nitroGLYCERIN (NITROSTAT) 0.4 MG SL tablet Place 1 tablet (0.4 mg total) under the tongue every 5 (five) minutes as needed for chest pain. 30 tablet 0  . omeprazole (PRILOSEC) 40 MG capsule Take 1 capsule (40 mg total) by mouth every evening. 90 capsule 2  . potassium chloride (MICRO-K) 10 MEQ CR capsule Take 1 capsule (10 mEq total) by mouth daily. 10 capsule 0  . triamterene-hydrochlorothiazide (DYAZIDE) 37.5-25 MG capsule TAKE 1 CAPSULE EVERY DAY 90 capsule 3  . zolpidem (AMBIEN) 5 MG tablet 1/2 tablet by mouth at bedtime 30 tablet 0   No current facility-administered medications on file prior to visit.    Review of Systems   All other system neg per pt    Objective:   Physical Exam BP 140/80   Pulse 97   Temp 98.8 F (37.1 C) (Oral)   Resp 20   Wt 161 lb (73 kg)   SpO2 99%   BMI 28.52 kg/m  VS noted, mild ill Constitutional: Pt appears in no apparent distress HENT: Head: NCAT.  Right Ear: External ear normal.  Left Ear: External ear normal.  Eyes: . Pupils are equal, round, and reactive to light. Conjunctivae and EOM are normal Bilat tm's with mild erythema.  Max sinus areas non tender.   Pharynx with mild erythema, no exudate Neck: Normal range of motion. Neck supple.  Cardiovascular: Normal rate and regular rhythm.   Pulmonary/Chest: Effort normal and breath sounds without rales or wheezing.  Neurological: Pt is alert. Not confused , motor grossly intact Skin: Skin is warm. No rash, no LE edema Psychiatric: Pt behavior is normal. No agitation.  No other new exam findings    Assessment & Plan:

## 2016-05-17 NOTE — Patient Instructions (Signed)
Please take all new medication as prescribed - the antibiotic, and cough medicine if needed  Please continue all other medications as before, and refills have been done if requested.  Please have the pharmacy call with any other refills you may need.  Please keep your appointments with your specialists as you may have planned     

## 2016-05-17 NOTE — Progress Notes (Signed)
Pre visit review using our clinic review tool, if applicable. No additional management support is needed unless otherwise documented below in the visit note. 

## 2016-05-22 NOTE — Assessment & Plan Note (Signed)
Mild to mod, for antibx course,  to f/u any worsening symptoms or concerns 

## 2016-05-22 NOTE — Assessment & Plan Note (Signed)
stable overall by history and exam, recent data reviewed with pt, and pt to continue medical treatment as before,  to f/u any worsening symptoms or concerns BP Readings from Last 3 Encounters:  05/17/16 140/80  04/12/16 (!) 154/90  03/04/16 116/61

## 2016-05-26 ENCOUNTER — Encounter: Payer: Self-pay | Admitting: Internal Medicine

## 2016-05-27 ENCOUNTER — Other Ambulatory Visit: Payer: Self-pay | Admitting: Internal Medicine

## 2016-05-27 ENCOUNTER — Other Ambulatory Visit: Payer: Self-pay | Admitting: Gastroenterology

## 2016-06-02 ENCOUNTER — Other Ambulatory Visit: Payer: Self-pay | Admitting: Internal Medicine

## 2016-06-09 ENCOUNTER — Ambulatory Visit (INDEPENDENT_AMBULATORY_CARE_PROVIDER_SITE_OTHER): Payer: Medicare PPO | Admitting: Internal Medicine

## 2016-06-09 ENCOUNTER — Encounter: Payer: Self-pay | Admitting: Internal Medicine

## 2016-06-09 VITALS — BP 130/80 | HR 76 | Ht 63.0 in | Wt 168.0 lb

## 2016-06-09 DIAGNOSIS — Z950 Presence of cardiac pacemaker: Secondary | ICD-10-CM | POA: Diagnosis not present

## 2016-06-09 DIAGNOSIS — I4589 Other specified conduction disorders: Secondary | ICD-10-CM

## 2016-06-09 LAB — CUP PACEART INCLINIC DEVICE CHECK
Battery Remaining Longevity: 97 mo
Battery Remaining Percentage: 100 %
Brady Statistic RA Percent Paced: 64 %
Brady Statistic RV Percent Paced: 1 %
Date Time Interrogation Session: 20171228142942
Implantable Lead Implant Date: 20170921
Implantable Lead Implant Date: 20170921
Implantable Lead Location: 753859
Implantable Lead Location: 753860
Implantable Lead Model: 377
Implantable Lead Model: 377
Implantable Lead Serial Number: 49575519
Implantable Lead Serial Number: 49580836
Implantable Pulse Generator Implant Date: 20170921
Lead Channel Impedance Value: 585 Ohm
Lead Channel Impedance Value: 604 Ohm
Lead Channel Pacing Threshold Amplitude: 0.5 V
Lead Channel Pacing Threshold Amplitude: 0.7 V
Lead Channel Pacing Threshold Pulse Width: 0.4 ms
Lead Channel Pacing Threshold Pulse Width: 0.4 ms
Lead Channel Sensing Intrinsic Amplitude: 13.5 mV
Lead Channel Sensing Intrinsic Amplitude: 3.1 mV
Lead Channel Setting Pacing Amplitude: 3 V
Lead Channel Setting Pacing Amplitude: 3 V
Lead Channel Setting Pacing Pulse Width: 0.4 ms
Pulse Gen Serial Number: 68850891

## 2016-06-09 MED ORDER — DILTIAZEM HCL ER COATED BEADS 120 MG PO CP24: 120.0000 mg | ORAL_CAPSULE | ORAL | 3 refills | Status: DC

## 2016-06-09 NOTE — Patient Instructions (Signed)
Medication Instructions:  Your physician recommends that you continue on your current medications as directed. Please refer to the Current Medication list given to you today.   Labwork: None Ordered   Testing/Procedures: None Ordered   Follow-Up: Your physician wants you to follow-up in: 9 month with Dr. Lovena Le. You will receive a reminder letter in the mail two months in advance. If you don't receive a letter, please call our office to schedule the follow-up appointment.  Remote monitoring is used to monitor your Pacemaker from home. This monitoring reduces the number of office visits required to check your device to one time per year. It allows [place] to keep an eye on the functioning of your device to ensure it is working properly. You are scheduled for a device check from home on 09/08/16. You may send your transmission at any time that day. If you have a wireless device, the transmission will be sent automatically. After your physician reviews your transmission, you will receive a postcard with your next transmission date.     Any Other Special Instructions Will Be Listed Below (If Applicable).     If you need a refill on your cardiac medications before your next appointment, please call your pharmacy.
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
## 2016-06-10 ENCOUNTER — Telehealth: Payer: Self-pay | Admitting: Internal Medicine

## 2016-06-10 NOTE — Telephone Encounter (Signed)
Advise about cough med otc.  Advise pt if she has any fever or sob, she need to go to ER or urgent care, pt verbalized understand to seek help.

## 2016-06-10 NOTE — Telephone Encounter (Signed)
If no worsening fever, or sob, ok to follow this, can take delsym otc as well if out of the tussionex as the cough can linger for several wks

## 2016-06-10 NOTE — Telephone Encounter (Signed)
Pt called stated she came in for URI on 05/17/2016. Pt stated she finish her med as directed but she still has a deep cough that can not get ride of. Please advise.

## 2016-06-15 ENCOUNTER — Telehealth: Payer: Self-pay

## 2016-06-15 NOTE — Telephone Encounter (Signed)
Patient called in with concerns about her device because she has the flu and is SOB right now.

## 2016-06-15 NOTE — Telephone Encounter (Signed)
Spoke with patient.  Patient states she went to [facility] and was diagnosed with the flu.  She is unsure which type.  She reports experiencing productive cough, runny nose, fever, chills, body aches, and diarrhea.  Patient states that her BP is "all over the place, from 132/91-164/108" and her HR is in the low 100s (106 at last check).  Advised patient that the elevated HR is likely due to her illness and is not being caused by her PPM.  Advised patient to ensure she is taking in enough fluids, and to call her PCP for further recommendations.  Patient verbalizes understanding of instructions and denies additional questions or concerns at this time.

## 2016-06-22 ENCOUNTER — Telehealth: Payer: Self-pay | Admitting: Internal Medicine

## 2016-06-22 NOTE — Telephone Encounter (Signed)
Pt called in said that she wanted Dr Jenny Reichmann to call in her a strong Antibiotic.  She know her body and she needs something strong to get rid of the cough. I explained to her that she would need an appt.  She said " listen here, I am the patient from [place] and I have been though everything and I know my body and dr Lenna Gilford just call in and gave me what I wanted"  Pt was rude and did does not want to come in.  She wants nurse of Dr Jenny Reichmann to call her.   She is expecting meds to be call in for her

## 2016-06-22 NOTE — Telephone Encounter (Signed)
Made patient aware of making an appointment.

## 2016-06-23 ENCOUNTER — Ambulatory Visit (INDEPENDENT_AMBULATORY_CARE_PROVIDER_SITE_OTHER): Payer: Medicare Other | Admitting: Internal Medicine

## 2016-06-23 ENCOUNTER — Encounter: Payer: Self-pay | Admitting: Internal Medicine

## 2016-06-23 VITALS — BP 138/78 | HR 102 | Temp 98.8°F | Resp 20 | Wt 162.0 lb

## 2016-06-23 DIAGNOSIS — F411 Generalized anxiety disorder: Secondary | ICD-10-CM | POA: Diagnosis not present

## 2016-06-23 DIAGNOSIS — R062 Wheezing: Secondary | ICD-10-CM | POA: Diagnosis not present

## 2016-06-23 DIAGNOSIS — R059 Cough, unspecified: Secondary | ICD-10-CM

## 2016-06-23 DIAGNOSIS — I10 Essential (primary) hypertension: Secondary | ICD-10-CM

## 2016-06-23 DIAGNOSIS — R05 Cough: Secondary | ICD-10-CM

## 2016-06-23 MED ORDER — BENZONATATE 100 MG PO CAPS: ORAL_CAPSULE | ORAL | 1 refills | Status: DC

## 2016-06-23 MED ORDER — CEFDINIR 300 MG PO CAPS: 300.0000 mg | ORAL_CAPSULE | Freq: Two times a day (BID) | ORAL | 0 refills | Status: DC

## 2016-06-23 MED ORDER — PREDNISONE 10 MG PO TABS: ORAL_TABLET | ORAL | 0 refills | Status: DC

## 2016-06-23 NOTE — Patient Instructions (Signed)
Please take all new medication as prescribed - the antibiotic, cough medicine, and prednisone ° °Please continue all other medications as before, and refills have been done if requested. ° °Please have the pharmacy call with any other refills you may need. ° °Please keep your appointments with your specialists as you may have planned ° ° ° °

## 2016-06-23 NOTE — Progress Notes (Signed)
Pre visit review using our clinic review tool, if applicable. No additional management support is needed unless otherwise documented below in the visit note. 

## 2016-06-23 NOTE — Progress Notes (Signed)
Subjective:    Patient ID: Renee [name], female    DOB: [birth year], 71 y.o.   MRN: [id number]  HPI  Pt here with c/o URI symptoms, apparently recurrent over many years over the course of several doctors and pt mentions details of many of these today, I think to impress on my the severity and importance of her concerns.  I was able to get in a few questions - Here with acute onset mild to mod 2-3 days ST, HA, general weakness and malaise, with prod cough greenish sputum, but Pt denies chest pain, increased sob or doe, wheezing, orthopnea, PND, increased LE swelling, palpitations, dizziness or syncope, except for onset wheezing/sob since last 1-2 days, worse at night. Denies worsening depressive symptoms, suicidal ideation, or panic; has ongoing anxiety Past Medical History:  Diagnosis Date  . Anxiety   . AV nodal re-entry tachycardia [facility])    a. s/p RFA 1993- Dr. Rolland Porter, [place]  . Broken neck ([place])    1993  . Cancer (HCC)    hx of cervical   ca  . Chronic chest pain    a. 2005 Cath: Mild nonobstructive plaque (30-40% LCX);  b. 08/2011 low risk myoview;  c. 09/2011 Coronary CT: NL cors w ca score of 0.  . Chronic pain syndrome   . Complication of anesthesia    blood pressure and heart dropped in 2009    . Concussion    1979, 1993  . Degenerative joint disease    a. s/p L Total Knee Arthroplasty.  . Diet-controlled type 2 diabetes mellitus ([place])   . Diverticulosis of colon   . Duodenitis without hemorrhage   . Fibromyalgia   . GERD (gastroesophageal reflux disease)   . Hemorrhoids   . Hypertension   . Irritable bowel syndrome   . Long QT interval    a. mild - advised to avoid meds that may prolong QT.  Marland Kitchen Neuropathy ([place])   . Osteoporosis   . SVT (supraventricular tachycardia) (HCC)    a. nonsustained SVT - previously offered flecainide but refused;  b. 12/2012 30 day event monitor w/o significant arrhythmias.  . Thyroid nodule    Past Surgical History:  Procedure Laterality Date    . ABLATION OF DYSRHYTHMIC FOCUS  1993  . BIOPSY THYROID    . CARDIAC ELECTROPHYSIOLOGY STUDY AND ABLATION     atrioventricular nodal reentant tachycardia  . CHOLECYSTECTOMY  1980  . COLONOSCOPY  06/25/2008   Diverticulosis and Hemorrhoids  . COLPOSCOPY    . CORONARY ANGIOPLASTY  2007  . EP IMPLANTABLE DEVICE N/A 03/03/2016   Procedure: Pacemaker Implant;  Surgeon: Evans Lance, MD;  Location: [place] [facility];  Service: Cardiovascular;  Laterality: N/A;  . ESOPHAGOGASTRODUODENOSCOPY    . HYSTEROSCOPY  2002   with resection of endometrial polyps. by Dr.MCPhail  . HYSTEROSCOPY W/D&C N/A 02/05/2015   Procedure: DILATATION AND CURETTAGE /HYSTEROSCOPY;  Surgeon: Vanessa Kick, MD;  Location: [place] [facility];  Service: Gynecology;  Laterality: N/A;  . LOOP RECORDER IMPLANT  08/21/2013   MDT LinQ implanted by Dr Lovena Le for syncope  . LOOP RECORDER IMPLANT N/A 08/21/2013   Procedure: LOOP RECORDER IMPLANT;  Surgeon: Evans Lance, MD;  [facility] C[facility] LAB;  Service: Cardiovascular;  Laterality: N/A;  . ORIF RADIAL HEAD / NECK FRACTURE  09/05/88  . TILT TABLE STUDY N/A 05/15/2013   Procedure: TILT TABLE STUDY;  Surgeon: Deboraha Sprang, MD;  Location: [facility] CATH LAB;  Service: Cardiovascular;  Laterality:  N/A;  . TOTAL HIP ARTHROPLASTY  10/10   Left, at [facility]    reports that she has never smoked. She has never used smokeless tobacco. She reports that she drinks alcohol. She reports that she does not use drugs. family history includes Asthma in her mother and sister; Diabetes in her brother, mother, and sister; Heart disease in her sister and sister; Lung cancer in her father; Ovarian cancer in her mother; Stroke in her mother. Allergies  Allergen Reactions  . Cyclobenzaprine Other (See Comments)    Pt states that this medication does not work for her.    . Adhesive [Tape] Itching, Rash and Other (See Comments)    Reaction:  Blisters  Pt states that she is allergic to the adhesive backing of  EKG pads.     . Moxifloxacin Other (See Comments)    Reaction:  Unknown   . Quinolones Other (See Comments)    Reaction:  Unknown    Current Outpatient Prescriptions on File Prior to Visit  Medication Sig Dispense Refill  . ALPRAZolam (XANAX) 0.5 MG tablet Take 0.5-1 tablets (0.25-0.5 mg total) by mouth 2 (two) times daily as needed for anxiety. 90 tablet 1  . aspirin EC 81 MG tablet Take 81 mg by mouth every morning.     . diltiazem (CARDIZEM CD) 120 MG 24 hr capsule Take 1 capsule (120 mg total) by mouth every morning. 90 capsule 3  . flecainide (TAMBOCOR) 50 MG tablet Take 0.5 tablets (25 mg total) by mouth 2 (two) times daily. 90 tablet 2  . glucose blood (ACCU-CHEK SMARTVIEW) test strip 1 each by Other route 2 (two) times daily. Use to check blood sugars twice a day Dx e11.9 300 each 2  . Lancets (ACCU-CHEK MULTICLIX) lancets 1 each by Other route 2 (two) times daily. Use to help check blood sugars twice a day Dx e11.9 1 each 0  . naproxen sodium (ANAPROX) 220 MG tablet Take 220 mg by mouth 2 (two) times daily as needed. For pain    . nitroGLYCERIN (NITROSTAT) 0.4 MG SL tablet Place 1 tablet (0.4 mg total) under the tongue every 5 (five) minutes as needed for chest pain. 30 tablet 0  . omeprazole (PRILOSEC) 40 MG capsule Take 1 capsule (40 mg total) by mouth every evening. 90 capsule 2  . potassium chloride (MICRO-K) 10 MEQ CR capsule TAKE 1 CAPSULE EVERY DAY 90 capsule 1  . triamterene-hydrochlorothiazide (DYAZIDE) 37.5-25 MG capsule TAKE 1 CAPSULE EVERY DAY 90 capsule 3  . zolpidem (AMBIEN) 5 MG tablet 1/2 tablet by mouth at bedtime 30 tablet 0   No current facility-administered medications on file prior to visit.    Review of Systems  Constitutional: Negative for unusual diaphoresis or night sweats HENT: Negative for ear swelling or discharge Eyes: Negative for worsening visual haziness  Respiratory: Negative for choking and stridor.   Gastrointestinal: Negative for distension  or worsening eructation Genitourinary: Negative for retention or change in urine volume.  Musculoskeletal: Negative for other MSK pain or swelling Skin: Negative for color change and worsening wound Neurological: Negative for tremors and numbness other than noted  Psychiatric/Behavioral: Negative for decreased concentration or agitation other than above   All other system neg per pt    Objective:   Physical Exam BP 138/78   Pulse (!) 102   Temp 98.8 F (37.1 C) (Oral)   Resp 20   Wt 162 lb (73.5 kg)   SpO2 96%   BMI 28.70 kg/m  VS noted, mild ill Constitutional: Pt appears in no apparent distress HENT: Head: NCAT.  Right Ear: External ear normal.  Left Ear: External ear normal.  Eyes: . Pupils are equal, round, and reactive to light. Conjunctivae and EOM are normal Bilat tm's with mild erythema.  Max sinus areas non tender.  Pharynx with mild erythema, no exudate Neck: Normal range of motion. Neck supple.  Cardiovascular: Normal rate and regular rhythm.   Pulmonary/Chest: Effort normal and breath sounds decreased without rales but with few mild scattered wheezing.  Neurological: Pt is alert. Not confused , motor grossly intact Skin: Skin is warm. No rash, no LE edema Psychiatric: Pt behavior is normal. No agitation. 2+ nervous, tense demeanor, difficult to smile No other new exam findings    Assessment & Plan:

## 2016-06-24 DIAGNOSIS — R05 Cough: Secondary | ICD-10-CM | POA: Insufficient documentation

## 2016-06-24 DIAGNOSIS — R053 Chronic cough: Secondary | ICD-10-CM | POA: Insufficient documentation

## 2016-06-24 DIAGNOSIS — R059 Cough, unspecified: Secondary | ICD-10-CM | POA: Insufficient documentation

## 2016-06-24 DIAGNOSIS — R062 Wheezing: Secondary | ICD-10-CM | POA: Insufficient documentation

## 2016-06-24 NOTE — Assessment & Plan Note (Signed)
Mild to mod, pt has several suggestions based on her understanding of prior treatments, c/w bronchitis vs pna, declines cxr, this time for antibx course,  to f/u any worsening symptoms or concerns

## 2016-06-24 NOTE — Assessment & Plan Note (Signed)
/  Mild to mod, for predpac asd,  to f/u any worsening symptoms or concerns 

## 2016-06-24 NOTE — Assessment & Plan Note (Signed)
Quite prominent today, pt with little insight I suspect, no further changes in tx per pt

## 2016-06-24 NOTE — Assessment & Plan Note (Signed)
stable overall by history and exam, recent data reviewed with pt, and pt to continue medical treatment as before,  to f/u any worsening symptoms or concerns BP Readings from Last 3 Encounters:  06/23/16 138/78  06/09/16 130/80  05/17/16 140/80

## 2016-07-26 ENCOUNTER — Telehealth: Payer: Self-pay | Admitting: Cardiology

## 2016-07-26 NOTE — Telephone Encounter (Signed)
Rec'd incoming call from pt. Pt had CP and near syncopal episode on Saturday around 11:55 AM. Pt stated she took 2 nitroglycerin SL tablets. Pt denied SOB. Pt stated she has been experiencing chest pain/discomfort for the past 35 years. Pt stated she is feeling fine today. Pt stated her PPM parameters were adjusted at last appt on 06/09/16. Made appt to see Dr. Lovena Le 07/29/16. Informed to call our office if pt has symptoms prior to appt. Informed if pt has to take 3 Nitroglycerin SL tablets in a succession, pt should call 911 or have someone drive her to the emergency department. Pt verbalized understanding.

## 2016-07-26 NOTE — Telephone Encounter (Signed)
Pt called and stated that on Saturday around 11:55 she stated that she experiencing some very server chest pain. She took 2 nitro's and probably should have taken a third but she did not take a third nito. Home monitor transmission was reviewed by device tech RN, and there was no recent events on the device. Call routed to triage nurse.

## 2016-07-28 ENCOUNTER — Other Ambulatory Visit (HOSPITAL_COMMUNITY): Payer: Self-pay | Admitting: Cardiology

## 2016-07-28 ENCOUNTER — Other Ambulatory Visit: Payer: Self-pay | Admitting: *Deleted

## 2016-07-28 MED ORDER — TRIAMTERENE-HCTZ 37.5-25 MG PO CAPS: 1.0000 | ORAL_CAPSULE | Freq: Every day | ORAL | 3 refills | Status: DC

## 2016-07-28 MED ORDER — POTASSIUM CHLORIDE ER 10 MEQ PO CPCR: 10.0000 meq | ORAL_CAPSULE | Freq: Every day | ORAL | 0 refills | Status: DC

## 2016-07-29 ENCOUNTER — Encounter: Payer: Self-pay | Admitting: Internal Medicine

## 2016-07-29 ENCOUNTER — Ambulatory Visit (INDEPENDENT_AMBULATORY_CARE_PROVIDER_SITE_OTHER): Payer: Medicare Other | Admitting: Internal Medicine

## 2016-07-29 VITALS — BP 134/82 | HR 84 | Ht 63.0 in | Wt 158.0 lb

## 2016-07-29 DIAGNOSIS — Z950 Presence of cardiac pacemaker: Secondary | ICD-10-CM

## 2016-07-29 DIAGNOSIS — I4589 Other specified conduction disorders: Secondary | ICD-10-CM | POA: Diagnosis not present

## 2016-07-29 LAB — CUP PACEART INCLINIC DEVICE CHECK
Implantable Lead Implant Date: 20170921
Implantable Lead Location: 753860
Implantable Lead Model: 377
Implantable Lead Model: 377
Implantable Lead Serial Number: 49580836
Implantable Pulse Generator Implant Date: 20170921
Lead Channel Impedance Value: 565 Ohm
Lead Channel Pacing Threshold Amplitude: 0.5 V
Lead Channel Pacing Threshold Amplitude: 0.5 V
Lead Channel Pacing Threshold Amplitude: 0.7 V
Lead Channel Pacing Threshold Amplitude: 0.8 V
Lead Channel Pacing Threshold Pulse Width: 0.4 ms
Lead Channel Pacing Threshold Pulse Width: 0.4 ms
Lead Channel Pacing Threshold Pulse Width: 0.4 ms
Lead Channel Sensing Intrinsic Amplitude: 14 mV
Lead Channel Sensing Intrinsic Amplitude: 14 mV
Lead Channel Sensing Intrinsic Amplitude: 3.7 mV
Lead Channel Sensing Intrinsic Amplitude: 3.9 mV
MDC IDC LEAD IMPLANT DT: 20170921
MDC IDC LEAD LOCATION: 753859
MDC IDC LEAD SERIAL: 49575519
MDC IDC MSMT LEADCHNL RA PACING THRESHOLD AMPLITUDE: 0.5 V
MDC IDC MSMT LEADCHNL RA PACING THRESHOLD PULSEWIDTH: 0.4 ms
MDC IDC MSMT LEADCHNL RA PACING THRESHOLD PULSEWIDTH: 0.4 ms
MDC IDC MSMT LEADCHNL RV IMPEDANCE VALUE: 604 Ohm
MDC IDC MSMT LEADCHNL RV PACING THRESHOLD AMPLITUDE: 0.7 V
MDC IDC MSMT LEADCHNL RV PACING THRESHOLD PULSEWIDTH: 0.4 ms
MDC IDC PG SERIAL: 68850891
MDC IDC SESS DTM: 20180216160140
MDC IDC SET LEADCHNL RA PACING AMPLITUDE: 1.5 V
MDC IDC SET LEADCHNL RV PACING AMPLITUDE: 1.3 V
MDC IDC SET LEADCHNL RV PACING PULSEWIDTH: 0.4 ms

## 2016-07-29 NOTE — Patient Instructions (Addendum)
Medication Instructions:  Your physician recommends that you continue on your current medications as directed. Please refer to the Current Medication list given to you today.   Labwork: None Ordered    Testing/Procedures: None Ordered   Follow-Up: Your physician wants you to follow-up in: September 2018 with Dr. Lovena Le. You will receive a reminder letter in the mail two months in advance. If you don't receive a letter, please call our office to schedule the follow-up appointment.  Remote monitoring is used to monitor your Pacemaker from home. This monitoring reduces the number of office visits required to check your device to one time per year. It allows [place] to keep an eye on the functioning of your device to ensure it is working properly. You are scheduled for a device check from home on 10/31/16. You may send your transmission at any time that day. If you have a wireless device, the transmission will be sent automatically. After your physician reviews your transmission, you will receive a postcard with your next transmission date.    Any Other Special Instructions Will Be Listed Below (If Applicable).     If you need a refill on your cardiac medications before your next appointment, please call your pharmacy.
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
## 2016-08-01 ENCOUNTER — Other Ambulatory Visit: Payer: Self-pay | Admitting: Internal Medicine

## 2016-08-01 DIAGNOSIS — R198 Other specified symptoms and signs involving the digestive system and abdomen: Secondary | ICD-10-CM | POA: Insufficient documentation

## 2016-08-01 DIAGNOSIS — E041 Nontoxic single thyroid nodule: Secondary | ICD-10-CM | POA: Insufficient documentation

## 2016-08-01 DIAGNOSIS — R0989 Other specified symptoms and signs involving the circulatory and respiratory systems: Secondary | ICD-10-CM | POA: Insufficient documentation

## 2016-08-01 DIAGNOSIS — I499 Cardiac arrhythmia, unspecified: Secondary | ICD-10-CM | POA: Diagnosis not present

## 2016-08-01 DIAGNOSIS — K219 Gastro-esophageal reflux disease without esophagitis: Secondary | ICD-10-CM | POA: Diagnosis not present

## 2016-08-01 DIAGNOSIS — R1310 Dysphagia, unspecified: Secondary | ICD-10-CM | POA: Diagnosis not present

## 2016-08-01 MED ORDER — FLECAINIDE ACETATE 50 MG PO TABS: 25.0000 mg | ORAL_TABLET | Freq: Two times a day (BID) | ORAL | 2 refills | Status: DC

## 2016-08-01 MED ORDER — DILTIAZEM HCL ER COATED BEADS 120 MG PO CP24: 120.0000 mg | ORAL_CAPSULE | ORAL | 3 refills | Status: DC

## 2016-08-01 NOTE — Telephone Encounter (Signed)
°*  STAT* If patient is at the pharmacy, call can be transferred to refill team.   1. Which medications need to be refilled? (please list name of each medication and dose if known) new preiscription for Cartia and Flecanide    Reference#[id number] 2. Which pharmacy/location (including street and city if local pharmacy) is medication to be sent to [place]  3. Do they need a 30 day or 90 day supply?90 and refills

## 2016-08-18 ENCOUNTER — Ambulatory Visit (INDEPENDENT_AMBULATORY_CARE_PROVIDER_SITE_OTHER): Payer: Medicare Other | Admitting: Pulmonary Disease

## 2016-08-18 ENCOUNTER — Telehealth: Payer: Self-pay | Admitting: Pulmonary Disease

## 2016-08-18 ENCOUNTER — Telehealth: Payer: Self-pay | Admitting: Internal Medicine

## 2016-08-18 ENCOUNTER — Encounter: Payer: Self-pay | Admitting: Pulmonary Disease

## 2016-08-18 VITALS — BP 132/90 | HR 80 | Temp 98.6°F | Ht 63.0 in | Wt 162.5 lb

## 2016-08-18 DIAGNOSIS — E041 Nontoxic single thyroid nodule: Secondary | ICD-10-CM | POA: Diagnosis not present

## 2016-08-18 DIAGNOSIS — J42 Unspecified chronic bronchitis: Secondary | ICD-10-CM | POA: Diagnosis not present

## 2016-08-18 DIAGNOSIS — F411 Generalized anxiety disorder: Secondary | ICD-10-CM

## 2016-08-18 DIAGNOSIS — R0789 Other chest pain: Secondary | ICD-10-CM | POA: Diagnosis not present

## 2016-08-18 DIAGNOSIS — I10 Essential (primary) hypertension: Secondary | ICD-10-CM | POA: Diagnosis not present

## 2016-08-18 DIAGNOSIS — Z950 Presence of cardiac pacemaker: Secondary | ICD-10-CM

## 2016-08-18 DIAGNOSIS — R55 Syncope and collapse: Secondary | ICD-10-CM | POA: Diagnosis not present

## 2016-08-18 DIAGNOSIS — K219 Gastro-esophageal reflux disease without esophagitis: Secondary | ICD-10-CM

## 2016-08-18 MED ORDER — METHYLPREDNISOLONE ACETATE 80 MG/ML IJ SUSP
80.0000 mg | Freq: Once | INTRAMUSCULAR | Status: AC
  Administered 2016-08-18: 80 mg via INTRAMUSCULAR

## 2016-08-18 MED ORDER — PREDNISONE 20 MG PO TABS: ORAL_TABLET | ORAL | 0 refills | Status: DC

## 2016-08-18 MED ORDER — AZITHROMYCIN 250 MG PO TABS: ORAL_TABLET | ORAL | 2 refills | Status: DC

## 2016-08-18 NOTE — Telephone Encounter (Signed)
Pt called in said that she is still sick and has this cough from back in dec.  She was last seen in jan .  She wanted to talk to the nurse and wanted Dr Jenny Reichmann to call something in for her.  Explain to her that she would have to make an appt in order to get the best care and she could be check out .  She got an attitude with me and said that she didn't think that it not my job to tell her that she would need to make an appt.  She said she has been a pt of [facility] for 35 years.  She said if Dr Jenny Reichmann needed to know anything about her he could just call him.  She went on to say that if I

## 2016-08-18 NOTE — Telephone Encounter (Signed)
Continuing ........Marland Kitchen  Didn't stop talking she would report this and she said that "this conversation was over"  I told her that I was happy to route the message and I would have nurse give her a call.

## 2016-08-18 NOTE — Progress Notes (Addendum)
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 congestion, chest pain, palpitations, dyspnea on exertion, nausea, change in bowel habits, indigestion/heartburn, back pain, joint swelling, muscle cramps, muscle weakness, arthritis, paresthesias, difficulty walking, and anxiety.  The patient denies fever, chills, anorexia, weight loss, sleep disorder, blurring, diplopia, eye irritation, eye discharge, vision loss, eye pain, photophobia, earache, ear discharge, tinnitus, decreased hearing, nosebleeds, sore throat, hoarseness, syncope, orthopnea, PND, peripheral edema, cough, dyspnea at rest, excessive sputum, hemoptysis, wheezing, pleurisy, vomiting, diarrhea, constipation, abdominal pain, melena, hematochezia, jaundice, gas/bloating, dysphagia, odynophagia, dysuria, hematuria, urinary frequency, urinary hesitancy, nocturia, incontinence, joint pain, stiffness, sciatica, restless legs, leg pain at night, leg pain with exertion, rash, itching, dryness, suspicious lesions, paralysis, seizures, tremors, vertigo, transient blindness, frequent falls, frequent headaches, depression, memory loss, confusion, cold intolerance, heat intolerance, polydipsia, polyphagia, polyuria, unusual weight change, abnormal bruising, bleeding, enlarged lymph nodes, urticaria, allergic rash, hay fever, and recurrent infections.     Objective:   Physical  Exam      WD, WN, 71 y/o WF in NAD... GENERAL:  Alert & oriented; pleasant & cooperative... HEENT:  /AT, EOM-wnl, PERRLA, EACs-clear, TMs-wnl, NOSE-clear, Throat- sl erythema, clear, mallampati1 NECK:  Supple w/ fairROM; no JVD; normal carotid impulses w/o bruits; no thyromegaly or nodules palpated; no lymphadenopathy. CHEST:  Clear to P & A; without wheezes/ rales/ or rhonchi heard; mult trigger points of FM... HEART:  Regular Rhythm; without murmurs/ rubs/ or gallops detected... ABDOMEN:  Soft & nontender; normal bowel sounds; no organomegaly or masses palpated... EXT: without deformities or arthritic changes; no varicose veins/ +venous insuffic/ no edema; mult trigger points... NEURO:  CN's intact; motor testing normal; sensory testing normal; gait normal & balance OK. DERM:  No lesions noted; no rash etc...  RADIOLOGY DATA:  Reviewed in the EPIC EMR & discussed w/ the patient...  LABORATORY DATA:  Reviewed in the EPIC EMR & discussed w/ the patient...   Assessment:      Hx Recurrent Bronchitic infections>  Similar albeit mild exac today w/ prolonged relapsing hx-- we discussed Rx w/ ZPak, Pred taper, & OTC meds of comfort- Mucinex, Delsym, tylenol, etc...   Hx spells w/ neg extensive medical evaluations; in my opinion these episodes represent anxiety/ stress/ pain disorder superimposed on situational depression; she has agreed to see Psychiatry DrPlovsky for consultation, medication management, and counseling services... In the interim she will start on Alprazolam 0.5mg - 1/2 to 1 tab po Tid... ~  Cards eval for syncope & they placed pacemeaker...  HBP>  Controlled on Cardizem120, Dyazide & K10...  CP/ CAD/ Hx tachyarrhythmia, ?long QT syndrome>  Followed by DrMcLean now & DrTaylor for Cards; on Tambocor & has loop recorder in place...  Hx Thyroid Nodule>  followed by  DrKerr & DrDNewman w/ mult prev Bx etc- all benign => she requests f/u eval by DrKerr to recheck her  thyroid...  GI> GERD, dysphagia, Divertics, IBS, etc>  Followed by DrPatterson/ [facility]  for GI... And DrShoemaker for ENT...  DJD, LBP, FM, Chr pain syndrome>  [facility] & she has seen [facility] here in [place]...  Hx DOG BITE>  She had hematoma right lat thigh area and sutures laceration left lat calf area; dressings as instructed by [facility] ER & Ortho teams; this gradually improved & resolved...  Anxiety/ stress/ panic disorder/ r/o depression>  As documented above...   Plan:     Patient's Medications  New Prescriptions   AZITHROMYCIN (ZITHROMAX) 250 MG TABLET    Take as directed   PREDNISONE (DELTASONE) 20 MG TABLET    Take as directed  Previous Medications   ALPRAZOLAM (XANAX) 0.5 MG TABLET    Take 0.5-1 tablets (0.25-0.5 mg total) by mouth 2 (two) times daily as needed for anxiety.   ASPIRIN EC 81 MG TABLET    Take 81 mg by mouth every morning.    DILTIAZEM (CARDIZEM CD) 120 MG 24 HR CAPSULE    Take 1 capsule (120 mg total) by mouth every morning.   FLECAINIDE (TAMBOCOR) 50 MG TABLET    Take 0.5 tablets (25 mg total) by mouth 2 (two) times daily.   GLUCOSE BLOOD (ACCU-CHEK SMARTVIEW) TEST STRIP    1 each by Other route 2 (two) times daily. Use to check blood sugars twice a day Dx e11.9   LANCETS (ACCU-CHEK MULTICLIX) LANCETS    1 each by Other route 2 (two) times daily. Use to help check blood sugars twice a day Dx e11.9   NAPROXEN SODIUM (ANAPROX) 220 MG TABLET    Take 220 mg by mouth 2 (two) times daily as needed. For pain   NITROGLYCERIN (NITROSTAT) 0.4 MG SL TABLET    Place 1 tablet (0.4 mg total) under the tongue every 5 (five) minutes as needed for chest pain.   OMEPRAZOLE (PRILOSEC) 40 MG CAPSULE    Take 1 capsule (40 mg total) by mouth every evening.   POTASSIUM CHLORIDE (MICRO-K) 10 MEQ CR CAPSULE    Take 1 capsule (10 mEq total) by mouth daily. Yearly physical due in March must see MD for refills   TRIAMTERENE-HYDROCHLOROTHIAZIDE (DYAZIDE) 37.5-25 MG CAPSULE     Take 1 each (1 capsule total) by mouth daily.   ZOLPIDEM (AMBIEN) 5 MG TABLET    1/2 tablet by mouth at bedtime  Modified Medications   No medications on file  Discontinued Medications   No medications on file

## 2016-08-18 NOTE — Patient Instructions (Signed)
Today we updated your med list in our EPIC system...    Continue your current medications the same...  Today we gave you a Depo shot for the upper airway inflammation... starting tomorrow AM- take the PREDNISONE as follows:    Start w/ one tab twice daily for 4 days...    Then decrease to one tab each AM for 4 days...    Then decrease to 1/2 tab each AM for 4 days...    Then decrease to 1/2 tab every other day til gone (1/2, 0, 1/2, 0 etc)...  We also wrote for an antibiotic ZITHROMAX via the [place]- take as directed on the pack...   and we gave you a refill if needed...  You may use the OTC Mucinex for the thick sputum (1-2tabs twice daily) Tou may use the OTC DELSYM cough syrup 2 tsp every 12H as needed... Use a SALINE nasal mist as needed to keep your sinus passages open & draining... Continue w/ the antireflux regimen as directed by DrShoemaker...  Call for any questions.Marland KitchenMarland Kitchen

## 2016-08-18 NOTE — Telephone Encounter (Signed)
Called pt stated not feeling good running fever and coughing since yesterday.Called  the office and talk front lady name [name] was very rude and trying to diagnosed her. Pt is aware probably to make an appointment to see Dr. Jenny Reichmann but that just insurance pusher....the patient upstair seeing pulmonology Dr. Today and coming to talk to [place] bout the bad experience

## 2016-08-18 NOTE — Telephone Encounter (Signed)
Pt called in and said that    Best number [phone]

## 2016-08-18 NOTE — Telephone Encounter (Signed)
Called and spoke with [facility] and she is aware that we can do the package or just the tablets.  She will give the pt the package with the directions.  Nothing further is needed.

## 2016-08-19 ENCOUNTER — Telehealth: Payer: Self-pay | Admitting: Pulmonary Disease

## 2016-08-19 NOTE — Telephone Encounter (Signed)
Spoke with pt. Pharmacy already had clarification on what was needed for the prendisone. Nothing further was needed.

## 2016-08-19 NOTE — Telephone Encounter (Signed)
Called [facility]. They had already been given clarification on prednisone instructions. Nothing further was needed.

## 2016-08-24 NOTE — Telephone Encounter (Signed)
Spoke with patient. She did talk to our nurse on 3/8 and was advised to be seen due to her symptoms. She was able to get in with Dr. Lenna Gilford and is feeling better. Was upset as she did not feel well and just wanted a nurse to call her in a timely manner.

## 2016-08-29 ENCOUNTER — Telehealth: Payer: Self-pay | Admitting: Internal Medicine

## 2016-08-29 NOTE — Telephone Encounter (Signed)
Patient should report to ED for chest pain as we do not manage active chest pain and could warrant prompt emergency care.

## 2016-08-29 NOTE — Telephone Encounter (Signed)
I spoke with patient.  She states she has been sick, saw Dr. Lenna Gilford (pulm), has taken 2 ZPAKs and has been taking prednisone QD since 08/18/16.  She reports having chest pain this morning with SOB (which she blames on acute illness).  She states she took NTG#1 @ 0806 HR 80, BP 134/88 without relief of CP, NTG#2 @ 0811 with relief.  She states she has not taken a third NTG.  She reports reading package insert on Zithromax, it advises against taking medication with prolonged QT and coronary dz.  She states she is concerned the medication has "done something" to her heart or pacer (inserted 02/2016).  I had pt reck HR/BP @ 0905 HR 71 BP 141/95.  Dr. Lovena Le is out of the office this week.  Patient is followed by Dr. Aundra Dubin as well.  I did advise patient I would let Dr. Bennetta Laos know what was happening and we would try to get her into Dr. Claris Gladden office sooner than 09/2016 appt.  I advised her if she had chest pain again to call 911 and take NTG#3.  Patient denies CP or SOB at this time.  She voiced understanding and agreed with plan.

## 2016-08-29 NOTE — Telephone Encounter (Signed)
Chest pain is not active at this time.

## 2016-08-29 NOTE — Telephone Encounter (Signed)
Pt does not have heart failure and has not been seen in HF clinic.   Dr Aundra Dubin where do you want her to be seen, you are booked until after vacation? Can not put her on APP side since it is not HF

## 2016-08-29 NOTE — Telephone Encounter (Signed)
Active chest pain would require emergency dept evaluation.  She should avoid azithromycin generally given QT-prolonging potential (if needs antibiotic, doxycycline would be an alternative).  Can try to arrange earlier appt.

## 2016-08-29 NOTE — Telephone Encounter (Signed)
She can be seen after I get back or by APP at [facility] for me.

## 2016-08-29 NOTE — Telephone Encounter (Signed)
New message   Pt c/o medication issue:  1. Name of Medication: azithromycin   2. How are you currently taking this medication (dosage and times per day)? Once day  3. Are you having a reaction (difficulty breathing--STAT)?   4. What is your medication issue? Pt wanting to call and talk about having to take this medication and she has a Psychologist, forensic.   Pt c/o of Chest Pain: STAT if CP now or developed within 24 hours  1. Are you having CP right now? no  2. Are you experiencing any other symptoms (ex. SOB, nausea, vomiting, sweating)? no  3. How long have you been experiencing CP? yes  4. Is your CP continuous or coming and going? Continuous   5. Have you taken Nitroglycerin? Pt took 2 this morning-she said she did not have to take 3. ?

## 2016-08-30 ENCOUNTER — Telehealth: Payer: Self-pay | Admitting: Pulmonary Disease

## 2016-08-30 MED ORDER — DOXYCYCLINE HYCLATE 100 MG PO TABS: 100.0000 mg | ORAL_TABLET | Freq: Two times a day (BID) | ORAL | 1 refills | Status: DC

## 2016-08-30 NOTE — Telephone Encounter (Signed)
Per SN -  Ok for Doxycycline 100mg  #14, 1 po BID with 1 refill.    Called and spoke to pt. Informed her of the recs per SN. Rx sent to preferred pharmacy. Pt verbalized understanding and denied any further questions or concerns at this time.

## 2016-08-30 NOTE — Telephone Encounter (Signed)
Called and spoke to pt. Pt is requesting a different stronger abx. Pt states she has prolonged QT and states the azithromycin will affect this. Pt states since being seen on 3.8.18 she feels slightly improved but states she is not back to baseline. Pt c/o prod cough with yellow mucus - little mucus production and chest congestion. Pt states she did not finish the original zpak she was prescribed.   Dr. Lenna Gilford, please advise. Thanks.   Allergies  Allergen Reactions  . Cyclobenzaprine Other (See Comments)    Pt states that this medication does not work for her.    . Adhesive [Tape] Itching, Rash and Other (See Comments)    Reaction:  Blisters  Pt states that she is allergic to the adhesive backing of EKG pads.     . Moxifloxacin Other (See Comments)    Reaction:  Unknown   . Quinolones Other (See Comments)    Reaction:  Unknown     Current Outpatient Prescriptions on File Prior to Visit  Medication Sig Dispense Refill  . ALPRAZolam (XANAX) 0.5 MG tablet Take 0.5-1 tablets (0.25-0.5 mg total) by mouth 2 (two) times daily as needed for anxiety. 90 tablet 1  . aspirin EC 81 MG tablet Take 81 mg by mouth every morning.     Marland Kitchen azithromycin (ZITHROMAX) 250 MG tablet Take as directed 6 tablet 2  . diltiazem (CARDIZEM CD) 120 MG 24 hr capsule Take 1 capsule (120 mg total) by mouth every morning. 90 capsule 3  . flecainide (TAMBOCOR) 50 MG tablet Take 0.5 tablets (25 mg total) by mouth 2 (two) times daily. 90 tablet 2  . glucose blood (ACCU-CHEK SMARTVIEW) test strip 1 each by Other route 2 (two) times daily. Use to check blood sugars twice a day Dx e11.9 300 each 2  . Lancets (ACCU-CHEK MULTICLIX) lancets 1 each by Other route 2 (two) times daily. Use to help check blood sugars twice a day Dx e11.9 1 each 0  . naproxen sodium (ANAPROX) 220 MG tablet Take 220 mg by mouth 2 (two) times daily as needed. For pain    . nitroGLYCERIN (NITROSTAT) 0.4 MG SL tablet Place 1 tablet (0.4 mg total) under the  tongue every 5 (five) minutes as needed for chest pain. 30 tablet 0  . omeprazole (PRILOSEC) 40 MG capsule Take 1 capsule (40 mg total) by mouth every evening. 90 capsule 2  . potassium chloride (MICRO-K) 10 MEQ CR capsule Take 1 capsule (10 mEq total) by mouth daily. Yearly physical due in March must see MD for refills 90 capsule 0  . predniSONE (DELTASONE) 20 MG tablet Take as directed 16 tablet 0  . triamterene-hydrochlorothiazide (DYAZIDE) 37.5-25 MG capsule Take 1 each (1 capsule total) by mouth daily. 90 capsule 3  . zolpidem (AMBIEN) 5 MG tablet 1/2 tablet by mouth at bedtime 30 tablet 0   No current facility-administered medications on file prior to visit.

## 2016-09-01 ENCOUNTER — Telehealth (HOSPITAL_COMMUNITY): Payer: Self-pay | Admitting: Cardiology

## 2016-09-01 NOTE — Telephone Encounter (Signed)
Per [name], RN, pt wants sooner appt with Dr. Aundra [name] than her 10/10/16 appt.  Called patient and left message for her to call our office regarding possible sooner appt.

## 2016-09-08 ENCOUNTER — Ambulatory Visit (INDEPENDENT_AMBULATORY_CARE_PROVIDER_SITE_OTHER): Payer: Medicare Other | Admitting: *Deleted

## 2016-09-08 DIAGNOSIS — R55 Syncope and collapse: Secondary | ICD-10-CM | POA: Diagnosis not present

## 2016-09-08 NOTE — Progress Notes (Signed)
Remote pacemaker transmission.   

## 2016-09-09 ENCOUNTER — Encounter: Payer: Self-pay | Admitting: Cardiology

## 2016-09-12 LAB — CUP PACEART REMOTE DEVICE CHECK
Brady Statistic AP VP Percent: 0 %
Brady Statistic AP VS Percent: 50 %
Brady Statistic AS VP Percent: 0 %
Brady Statistic AS VS Percent: 50 %
Implantable Lead Implant Date: 20170921
Implantable Lead Implant Date: 20170921
Implantable Lead Location: 753860
Implantable Lead Model: 377
Implantable Lead Serial Number: 49575519
Lead Channel Impedance Value: 507 Ohm
Lead Channel Impedance Value: 546 Ohm
Lead Channel Pacing Threshold Pulse Width: 0.4 ms
Lead Channel Pacing Threshold Pulse Width: 0.4 ms
Lead Channel Setting Pacing Amplitude: 1.5 V
MDC IDC LEAD LOCATION: 753859
MDC IDC LEAD SERIAL: 49580836
MDC IDC MSMT LEADCHNL RA PACING THRESHOLD AMPLITUDE: 0.5 V
MDC IDC MSMT LEADCHNL RV PACING THRESHOLD AMPLITUDE: 0.7 V
MDC IDC PG IMPLANT DT: 20170921
MDC IDC SESS DTM: 20180402064002
MDC IDC SET LEADCHNL RV PACING AMPLITUDE: 1.2 V
MDC IDC SET LEADCHNL RV PACING PULSEWIDTH: 0.4 ms
MDC IDC STAT BRADY RA PERCENT PACED: 50 %
MDC IDC STAT BRADY RV PERCENT PACED: 0 %
Pulse Gen Serial Number: 68850891

## 2016-09-27 ENCOUNTER — Other Ambulatory Visit: Payer: Self-pay | Admitting: Obstetrics and Gynecology

## 2016-09-27 DIAGNOSIS — Z124 Encounter for screening for malignant neoplasm of cervix: Secondary | ICD-10-CM | POA: Diagnosis not present

## 2016-09-27 DIAGNOSIS — Z01411 Encounter for gynecological examination (general) (routine) with abnormal findings: Secondary | ICD-10-CM | POA: Diagnosis not present

## 2016-09-28 LAB — CYTOLOGY - PAP

## 2016-10-06 ENCOUNTER — Encounter (HOSPITAL_COMMUNITY): Payer: Self-pay | Admitting: Emergency Medicine

## 2016-10-06 ENCOUNTER — Observation Stay (HOSPITAL_COMMUNITY)
Admission: EM | Admit: 2016-10-06 | Discharge: 2016-10-07 | Disposition: A | Discharge status: Home / Self Care | Payer: Medicare Other | Attending: Internal Medicine | Admitting: Internal Medicine

## 2016-10-06 ENCOUNTER — Emergency Department (HOSPITAL_COMMUNITY): Payer: Medicare Other

## 2016-10-06 DIAGNOSIS — R42 Dizziness and giddiness: Secondary | ICD-10-CM | POA: Diagnosis not present

## 2016-10-06 DIAGNOSIS — Z7982 Long term (current) use of aspirin: Secondary | ICD-10-CM | POA: Diagnosis not present

## 2016-10-06 DIAGNOSIS — I10 Essential (primary) hypertension: Secondary | ICD-10-CM | POA: Diagnosis not present

## 2016-10-06 DIAGNOSIS — R0789 Other chest pain: Principal | ICD-10-CM | POA: Insufficient documentation

## 2016-10-06 DIAGNOSIS — R55 Syncope and collapse: Secondary | ICD-10-CM | POA: Diagnosis present

## 2016-10-06 DIAGNOSIS — G894 Chronic pain syndrome: Secondary | ICD-10-CM | POA: Diagnosis present

## 2016-10-06 DIAGNOSIS — R079 Chest pain, unspecified: Secondary | ICD-10-CM

## 2016-10-06 DIAGNOSIS — M797 Fibromyalgia: Secondary | ICD-10-CM | POA: Diagnosis present

## 2016-10-06 DIAGNOSIS — R404 Transient alteration of awareness: Secondary | ICD-10-CM | POA: Diagnosis not present

## 2016-10-06 DIAGNOSIS — Z8541 Personal history of malignant neoplasm of cervix uteri: Secondary | ICD-10-CM | POA: Diagnosis not present

## 2016-10-06 DIAGNOSIS — E119 Type 2 diabetes mellitus without complications: Secondary | ICD-10-CM | POA: Insufficient documentation

## 2016-10-06 DIAGNOSIS — Z96642 Presence of left artificial hip joint: Secondary | ICD-10-CM | POA: Insufficient documentation

## 2016-10-06 DIAGNOSIS — Z79899 Other long term (current) drug therapy: Secondary | ICD-10-CM | POA: Diagnosis not present

## 2016-10-06 DIAGNOSIS — I471 Supraventricular tachycardia: Secondary | ICD-10-CM | POA: Diagnosis not present

## 2016-10-06 DIAGNOSIS — Z950 Presence of cardiac pacemaker: Secondary | ICD-10-CM | POA: Insufficient documentation

## 2016-10-06 DIAGNOSIS — M25519 Pain in unspecified shoulder: Secondary | ICD-10-CM | POA: Diagnosis not present

## 2016-10-06 DIAGNOSIS — F411 Generalized anxiety disorder: Secondary | ICD-10-CM | POA: Diagnosis present

## 2016-10-06 DIAGNOSIS — I2510 Atherosclerotic heart disease of native coronary artery without angina pectoris: Secondary | ICD-10-CM | POA: Diagnosis present

## 2016-10-06 LAB — BASIC METABOLIC PANEL
ANION GAP: 9 (ref 5–15)
BUN: 10 mg/dL (ref 6–20)
CALCIUM: 10.2 mg/dL (ref 8.9–10.3)
CO2: 27 mmol/L (ref 22–32)
Chloride: 103 mmol/L (ref 101–111)
Creatinine, Ser: 0.77 mg/dL (ref 0.44–1.00)
GFR calc non Af Amer: >60 mL/min (ref >60)
GLUCOSE: 101 mg/dL — AB (ref 65–99)
POTASSIUM: 3.3 mmol/L — AB (ref 3.5–5.1)
Sodium: 139 mmol/L (ref 135–145)

## 2016-10-06 LAB — CBC
HEMATOCRIT: 42.3 % (ref 36.0–46.0)
HEMATOCRIT: 42.5 % (ref 36.0–46.0)
HEMOGLOBIN: 14.2 g/dL (ref 12.0–15.0)
HEMOGLOBIN: 14 g/dL (ref 12.0–15.0)
MCH: 28.1 pg (ref 26.0–34.0)
MCH: 27.6 pg (ref 26.0–34.0)
MCHC: 33.6 g/dL (ref 30.0–36.0)
MCHC: 32.9 g/dL (ref 30.0–36.0)
MCV: 83.6 fL (ref 78.0–100.0)
MCV: 83.8 fL (ref 78.0–100.0)
Platelets: 233 10*3/uL (ref 150–400)
Platelets: 232 10*3/uL (ref 150–400)
RBC: 5.06 MIL/uL (ref 3.87–5.11)
RBC: 5.07 MIL/uL (ref 3.87–5.11)
RDW: 13.3 % (ref 11.5–15.5)
RDW: 13.5 % (ref 11.5–15.5)
WBC: 5.5 10*3/uL (ref 4.0–10.5)
WBC: 5.8 10*3/uL (ref 4.0–10.5)

## 2016-10-06 LAB — CREATININE, SERUM
Creatinine, Ser: 0.78 mg/dL (ref 0.44–1.00)
GFR calc Af Amer: >60 mL/min (ref >60)

## 2016-10-06 LAB — TROPONIN I: Troponin I: <0.03 ng/mL (ref <0.03)

## 2016-10-06 LAB — TSH: TSH: 0.951 u[IU]/mL (ref 0.350–4.500)

## 2016-10-06 LAB — HEPATIC FUNCTION PANEL
ALBUMIN: 4.4 g/dL (ref 3.5–5.0)
ALT: 19 U/L (ref 14–54)
AST: 21 U/L (ref 15–41)
Alkaline Phosphatase: 88 U/L (ref 38–126)
BILIRUBIN TOTAL: 1.4 mg/dL — AB (ref 0.3–1.2)
Bilirubin, Direct: 0.2 mg/dL (ref 0.1–0.5)
Indirect Bilirubin: 1.2 mg/dL — ABNORMAL HIGH (ref 0.3–0.9)
TOTAL PROTEIN: 7.3 g/dL (ref 6.5–8.1)

## 2016-10-06 LAB — I-STAT TROPONIN, ED: Troponin i, poc: 0 ng/mL (ref 0.00–0.08)

## 2016-10-06 LAB — GLUCOSE, CAPILLARY: GLUCOSE-CAPILLARY: 186 mg/dL — AB (ref 65–99)

## 2016-10-06 MED ORDER — ACETAMINOPHEN 325 MG PO TABS
650.0000 mg | ORAL_TABLET | Freq: Once | ORAL | Status: AC
  Administered 2016-10-06: 650 mg via ORAL
  Filled 2016-10-06: qty 2

## 2016-10-06 MED ORDER — TRIAMTERENE-HCTZ 37.5-25 MG PO CAPS
1.0000 | ORAL_CAPSULE | Freq: Every day | ORAL | Status: DC
  Filled 2016-10-06: qty 1

## 2016-10-06 MED ORDER — ATORVASTATIN CALCIUM 40 MG PO TABS
40.0000 mg | ORAL_TABLET | Freq: Every day | ORAL | Status: DC
  Filled 2016-10-06 (×2): qty 1

## 2016-10-06 MED ORDER — POTASSIUM CHLORIDE CRYS ER 10 MEQ PO TBCR
10.0000 meq | EXTENDED_RELEASE_TABLET | Freq: Every day | ORAL | Status: DC
  Administered 2016-10-06: 10 meq via ORAL
  Filled 2016-10-06 (×2): qty 1

## 2016-10-06 MED ORDER — ONDANSETRON HCL 4 MG/2ML IJ SOLN
4.0000 mg | Freq: Four times a day (QID) | INTRAMUSCULAR | Status: DC | PRN
Start: 2016-10-06 — End: 2016-10-07

## 2016-10-06 MED ORDER — ZOLPIDEM TARTRATE 5 MG PO TABS: 5.0000 mg | ORAL_TABLET | Freq: Every evening | ORAL | Status: DC | PRN

## 2016-10-06 MED ORDER — SODIUM CHLORIDE 0.9 % IV SOLN
INTRAVENOUS | Status: DC
  Administered 2016-10-07: 06:00:00 via INTRAVENOUS

## 2016-10-06 MED ORDER — INSULIN ASPART 100 UNIT/ML ~~LOC~~ SOLN: 0.0000 [IU] | Freq: Three times a day (TID) | SUBCUTANEOUS | Status: DC

## 2016-10-06 MED ORDER — DILTIAZEM HCL ER COATED BEADS 120 MG PO CP24
120.0000 mg | ORAL_CAPSULE | ORAL | Status: DC
  Administered 2016-10-07: 120 mg via ORAL
  Filled 2016-10-06: qty 1

## 2016-10-06 MED ORDER — INSULIN ASPART 100 UNIT/ML ~~LOC~~ SOLN: 0.0000 [IU] | Freq: Every day | SUBCUTANEOUS | Status: DC

## 2016-10-06 MED ORDER — ASPIRIN EC 81 MG PO TBEC: 81.0000 mg | DELAYED_RELEASE_TABLET | Freq: Every day | ORAL | Status: DC

## 2016-10-06 MED ORDER — PANTOPRAZOLE SODIUM 40 MG PO TBEC
40.0000 mg | DELAYED_RELEASE_TABLET | Freq: Every day | ORAL | Status: DC
  Administered 2016-10-06: 40 mg via ORAL
  Filled 2016-10-06: qty 1

## 2016-10-06 MED ORDER — ASPIRIN EC 81 MG PO TBEC
81.0000 mg | DELAYED_RELEASE_TABLET | Freq: Every day | ORAL | Status: DC
  Filled 2016-10-06: qty 1

## 2016-10-06 MED ORDER — ASPIRIN 81 MG PO CHEW: 324.0000 mg | CHEWABLE_TABLET | ORAL | Status: DC

## 2016-10-06 MED ORDER — ACETAMINOPHEN 325 MG PO TABS: 650.0000 mg | ORAL_TABLET | ORAL | Status: DC | PRN

## 2016-10-06 MED ORDER — ASPIRIN 300 MG RE SUPP: 300.0000 mg | RECTAL | Status: DC

## 2016-10-06 MED ORDER — HEPARIN SODIUM (PORCINE) 5000 UNIT/ML IJ SOLN
5000.0000 [IU] | Freq: Three times a day (TID) | INTRAMUSCULAR | Status: DC
  Administered 2016-10-06 – 2016-10-07 (×2): 5000 [IU] via SUBCUTANEOUS
  Filled 2016-10-06 (×2): qty 1

## 2016-10-06 MED ORDER — ALPRAZOLAM 0.25 MG PO TABS: 0.2500 mg | ORAL_TABLET | Freq: Two times a day (BID) | ORAL | Status: DC | PRN

## 2016-10-06 MED ORDER — FLECAINIDE ACETATE 50 MG PO TABS
25.0000 mg | ORAL_TABLET | Freq: Two times a day (BID) | ORAL | Status: DC
  Administered 2016-10-06: 25 mg via ORAL
  Filled 2016-10-06 (×3): qty 1

## 2016-10-06 MED ORDER — NITROGLYCERIN 0.4 MG SL SUBL: 0.4000 mg | SUBLINGUAL_TABLET | SUBLINGUAL | Status: DC | PRN

## 2016-10-06 NOTE — ED Notes (Signed)
Cardiology at the bedside.

## 2016-10-06 NOTE — ED Notes (Signed)
Pt c/o "headache on the right side" ever since being transported via EMS. Crystal RN made aware.
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
## 2016-10-06 NOTE — ED Notes (Signed)
Pt ambulated to restroom with one staff Venice Liz(NT), no issues. Pt placed back on monitor.

## 2016-10-06 NOTE — ED Triage Notes (Signed)
Patient came from work with [facility] for sudden onset of upper shoulder blade pain.  Patient has history of angina, took 2 SL nitro 5 minutes apart, states she was unable to take a third because of increasing nausea.  Patient alert and oriented and in no apparent distress at this time.

## 2016-10-06 NOTE — ED Provider Notes (Signed)
[facility] DEPT Provider Note   CSN: [id number] Arrival date & time: 10/06/16  1413     History   Chief Complaint Chief Complaint  Patient presents with  . Chest Pain    HPI Renee [name] is a 71 y.o. female.  HPI Patient presents with chest pain. Has had a few episodes of it but had a worse episode today. States she normally takes 2 nitroglycerin's and it makes her angina go away but this time it did not. Pain-free now. History of coronary artery disease also has a pacemaker. Episode came on while she was walking. Pain is dull in her upper chest and left shoulder area.   Past Medical History:  Diagnosis Date  . Anxiety   . AV nodal re-entry tachycardia [facility])    a. s/p RFA 1993- Dr. Rolland Porter, [place]  . Broken neck ([place])    1993  . Cancer (HCC)    hx of cervical   ca  . Chronic chest pain    a. 2005 Cath: Mild nonobstructive plaque (30-40% LCX);  b. 08/2011 low risk myoview;  c. 09/2011 Coronary CT: NL cors w ca score of 0.  . Chronic pain syndrome   . Complication of anesthesia    blood pressure and heart dropped in 2009    . Concussion    1979, 1993  . Degenerative joint disease    a. s/p L Total Knee Arthroplasty.  . Diet-controlled type 2 diabetes mellitus ([place])   . Diverticulosis of colon   . Duodenitis without hemorrhage   . Fibromyalgia   . GERD (gastroesophageal reflux disease)   . Hemorrhoids   . Hypertension   . Irritable bowel syndrome   . Long QT interval    a. mild - advised to avoid meds that may prolong QT.  Marland Kitchen Neuropathy   . Osteoporosis   . SVT (supraventricular tachycardia) (HCC)    a. nonsustained SVT - previously offered flecainide but refused;  b. 12/2012 30 day event monitor w/o significant arrhythmias.  . Thyroid nodule     Patient Active Problem List   Diagnosis Date Noted  . Laryngopharyngeal reflux (LPR) 08/18/2016  . Cough 06/24/2016  . Wheezing 06/24/2016  . Acute upper respiratory infection 05/17/2016  . Syncope 03/03/2016  .  Pacemaker 03/03/2016  . Chest pain at rest 09/13/2015  . Spinal stenosis of lumbar region 09/07/2015  . Raynaud phenomenon 09/07/2015  . Insomnia 09/07/2015  . Wellness examination 12/18/2014  . Paroxysmal spells 01/28/2014  . Dysthymia 01/28/2014  . Injury of leg 10/07/2013  . Headache(784.0) 03/14/2013  . Eye problem 02/15/2013  . Persistent headaches 02/15/2013  . Pre-syncope 10/04/2011  . NSVT (nonsustained ventricular tachycardia) ([place]) 10/04/2011  . DYSPNEA 10/01/2009  . Diverticulosis of large intestine 09/24/2009  . SWEATING 09/24/2009  . FATIQUE AND MALAISE 09/16/2009  . LONG QT SYNDROME 03/19/2009  . AV NODAL REENTRY TACHYCARDIA 10/03/2008  . Other dysphagia 06/17/2008  . HEMORRHOIDS 06/16/2008  . DUODENITIS, WITHOUT HEMORRHAGE 06/16/2008  . CONSTIPATION, CHRONIC 06/16/2008  . THYROID NODULE, right lobe. 05/26/2008  . Chronic pain syndrome 05/26/2008  . Coronary atherosclerosis 05/26/2008  . BRONCHITIS, RECURRENT 05/26/2008  . Irritable bowel syndrome 05/26/2008  . FECAL INCONTINENCE 05/26/2008  . Anxiety state 03/24/2008  . Essential hypertension 03/24/2008  . GERD 03/24/2008  . DEGENERATIVE JOINT DISEASE 03/24/2008  . BACK PAIN, LUMBAR 03/24/2008  . FIBROMYALGIA 03/24/2008  . Chest pain 03/24/2008  . NEUROPATHY, HX OF 03/24/2008    Past Surgical History:  Procedure Laterality  Date  . ABLATION OF DYSRHYTHMIC FOCUS  1993  . BIOPSY THYROID    . CARDIAC ELECTROPHYSIOLOGY STUDY AND ABLATION     atrioventricular nodal reentant tachycardia  . CHOLECYSTECTOMY  1980  . COLONOSCOPY  06/25/2008   Diverticulosis and Hemorrhoids  . COLPOSCOPY    . CORONARY ANGIOPLASTY  2007  . EP IMPLANTABLE DEVICE N/A 03/03/2016   Procedure: Pacemaker Implant;  Surgeon: Evans Lance, MD;  Location: [place] CV LAB;  Service: Cardiovascular;  Laterality: N/A;  . ESOPHAGOGASTRODUODENOSCOPY    . HYSTEROSCOPY  2002   with resection of endometrial polyps. by Dr.MCPhail  .  HYSTEROSCOPY W/D&C N/A 02/05/2015   Procedure: DILATATION AND CURETTAGE /HYSTEROSCOPY;  Surgeon: Vanessa Kick, MD;  Location: [facility];  Service: Gynecology;  Laterality: N/A;  . LOOP RECORDER IMPLANT  08/21/2013   MDT LinQ implanted by Dr Lovena Le for syncope  . LOOP RECORDER IMPLANT N/A 08/21/2013   Procedure: LOOP RECORDER IMPLANT;  Surgeon: Evans Lance, MD;  Location: [facility] CATH LAB;  Service: Cardiovascular;  Laterality: N/A;  . ORIF RADIAL HEAD / NECK FRACTURE  09/05/88  . TILT TABLE STUDY N/A 05/15/2013   Procedure: TILT TABLE STUDY;  Surgeon: Deboraha Sprang, MD;  Location: [facility] CATH LAB;  Service: Cardiovascular;  Laterality: N/A;  . TOTAL HIP ARTHROPLASTY  10/10   Left, at [facility] History    Gravida Para Term Preterm AB Living   2 2       2    SAB TAB Ectopic Multiple Live Births                   Home Medications    Prior to Admission medications   Medication Sig Start Date End Date Taking? Authorizing Provider  ALPRAZolam Duanne Moron) 0.5 MG tablet Take 0.5-1 tablets (0.25-0.5 mg total) by mouth 2 (two) times daily as needed for anxiety. Patient taking differently: Take 0.25 mg by mouth daily as needed for anxiety.  02/04/15  Yes Biagio Borg, MD  aspirin EC 81 MG tablet Take 81 mg by mouth daily.    Yes Historical Provider, MD  diltiazem (CARDIZEM CD) 120 MG 24 hr capsule Take 1 capsule (120 mg total) by mouth every morning. Patient taking differently: Take 120 mg by mouth daily.  08/01/16  Yes Evans Lance, MD  flecainide (TAMBOCOR) 50 MG tablet Take 0.5 tablets (25 mg total) by mouth 2 (two) times daily. 08/01/16  Yes Evans Lance, MD  naproxen sodium (ALEVE) 220 MG tablet Take 440 mg by mouth 2 (two) times daily as needed (arthritis pain).   Yes Historical Provider, MD  nitroGLYCERIN (NITROSTAT) 0.4 MG SL tablet Place 1 tablet (0.4 mg total) under the tongue every 5 (five) minutes as needed for chest pain. 02/03/15  Yes Biagio Borg, MD  omeprazole (PRILOSEC) 40 MG  capsule Take 1 capsule (40 mg total) by mouth every evening. Patient taking differently: Take 40 mg by mouth every evening. 5pm 05/13/15  Yes Ladene Artist, MD  potassium chloride (MICRO-K) 10 MEQ CR capsule Take 1 capsule (10 mEq total) by mouth daily. Yearly physical due in March must see MD for refills 07/28/16  Yes Biagio Borg, MD  triamterene-hydrochlorothiazide (DYAZIDE) 37.5-25 MG capsule Take 1 each (1 capsule total) by mouth daily. 07/28/16  Yes Jolaine Artist, MD  zolpidem (AMBIEN) 5 MG tablet Take 2.5-5 mg by mouth at bedtime as needed for sleep.  04/12/16  Yes Elby Showers  Aundra Dubin, MD  glucose blood (ACCU-CHEK SMARTVIEW) test strip 1 each by Other route 2 (two) times daily. Use to check blood sugars twice a day Dx e11.9 10/06/15   Biagio Borg, MD  Lancets (ACCU-CHEK MULTICLIX) lancets 1 each by Other route 2 (two) times daily. Use to help check blood sugars twice a day Dx e11.9 10/06/15   Biagio Borg, MD    Family History Family History  Problem Relation Age of Onset  . Ovarian cancer Mother   . Stroke Mother   . Diabetes Mother   . Asthma Mother   . Lung cancer Father     Heart problems  . Heart disease Sister   . Diabetes Brother   . Diabetes Sister     Pacemaker, CHF  . Heart disease Sister   . Asthma Sister     Social History Social History  Substance Use Topics  . Smoking status: Never Smoker  . Smokeless tobacco: Never Used  . Alcohol use Yes     Comment: Occasional      Allergies   Cyclobenzaprine; Azithromycin; Adhesive [tape]; Moxifloxacin; and Quinolones   Review of Systems Review of Systems  Constitutional: Negative for appetite change.  HENT: Negative for congestion.   Respiratory: Negative for shortness of breath.   Cardiovascular: Positive for chest pain.  Gastrointestinal: Negative for abdominal pain.  Genitourinary: Negative for dysuria.  Musculoskeletal: Negative for back pain.  Neurological: Negative for seizures.  Hematological:  Negative for adenopathy.  Psychiatric/Behavioral: Negative for confusion.     Physical Exam Updated Vital Signs BP 129/74 (BP Location: Right Arm)   Pulse 62   Temp 97.9 F (36.6 C) (Oral)   Resp 14   SpO2 97%   Physical Exam  Constitutional: She appears well-developed.  HENT:  Head: Atraumatic.  Neck: Neck supple.  Cardiovascular: Normal rate.   Pulmonary/Chest: Effort normal.  Abdominal: Soft. There is no tenderness.  Musculoskeletal: She exhibits no edema.  Neurological: She is alert.  Skin: Skin is warm.  Psychiatric: She has a normal mood and affect.     ED Treatments / Results  Labs (all labs ordered are listed, but only abnormal results are displayed) Labs Reviewed  BASIC METABOLIC PANEL - Abnormal; Notable for the following:       Result Value   Potassium 3.3 (*)    Glucose, Bld 101 (*)    All other components within normal limits  CBC  I-STAT TROPOININ, ED    EKG  EKG Interpretation  Date/Time:  Thursday October 06 2016 14:28:11 EDT Ventricular Rate:  79 PR Interval:    QRS Duration: 97 QT Interval:  401 QTC Calculation: 460 R Axis:   18 Text Interpretation:  Sinus rhythm Prolonged PR interval Low voltage, precordial leads Borderline T abnormalities, diffuse leads Confirmed by Alvino Chapel  MD, Duaa Stelzner [phone]) on 10/06/2016 3:53:33 PM       EKG Interpretation  Date/Time:  Thursday October 06 2016 14:28:45 EDT Ventricular Rate:  79 PR Interval:    QRS Duration: 145 QT Interval:  437 QTC Calculation: 501 R Axis:   -76 Text Interpretation:  ventricularly paced Confirmed by Alvino Chapel  MD, Cartrell Bentsen [phone]) on 10/06/2016 3:54:22 PM        Radiology Dg Chest 2 View  Result Date: 10/06/2016 CLINICAL DATA:  Initial evaluation for acute chest pain. EXAM: CHEST  2 VIEW COMPARISON:  Prior radiograph from 06/12/2016. FINDINGS: Left-sided transvenous pacemaker/ AICD in place, stable. Mild cardiomegaly, unchanged. Mediastinal silhouette normal. Lungs normally  inflated. No focal infiltrate, pulmonary edema, or pleural effusion. No pneumothorax. No acute osseous abnormality. IMPRESSION: Stable appearance of the chest.  No active cardiopulmonary disease. Electronically Signed   By: Jeannine Boga M.D.   On: 10/06/2016 15:19    Procedures Procedures (including critical care time)  Medications Ordered in ED Medications - No data to display   Initial Impression / Assessment and Plan / ED Course  I have reviewed the triage vital signs and the nursing notes.  Pertinent labs & imaging results that were available during my care of the patient were reviewed by me and considered in my medical decision making (see chart for details).     Patient with chest pain. Like previous reported angina but came on and was not relieved with her normal dose of nitroglycerin. EKG is reassuring and also paced at times. X-ray reassuring. To be seen by cardiology.  Final Clinical Impressions(s) / ED Diagnoses   Final diagnoses:  Chest pain, unspecified type    New Prescriptions New Prescriptions   No medications on file     Davonna Belling, MD 10/06/16 1558
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
## 2016-10-07 ENCOUNTER — Telehealth: Payer: Self-pay | Admitting: *Deleted

## 2016-10-07 ENCOUNTER — Encounter (HOSPITAL_COMMUNITY): Payer: Self-pay | Admitting: *Deleted

## 2016-10-07 DIAGNOSIS — R0789 Other chest pain: Secondary | ICD-10-CM | POA: Diagnosis not present

## 2016-10-07 DIAGNOSIS — Z79899 Other long term (current) drug therapy: Secondary | ICD-10-CM | POA: Diagnosis not present

## 2016-10-07 DIAGNOSIS — I471 Supraventricular tachycardia: Secondary | ICD-10-CM

## 2016-10-07 DIAGNOSIS — Z7982 Long term (current) use of aspirin: Secondary | ICD-10-CM | POA: Diagnosis not present

## 2016-10-07 DIAGNOSIS — R079 Chest pain, unspecified: Secondary | ICD-10-CM | POA: Diagnosis not present

## 2016-10-07 DIAGNOSIS — E119 Type 2 diabetes mellitus without complications: Secondary | ICD-10-CM | POA: Diagnosis not present

## 2016-10-07 DIAGNOSIS — I10 Essential (primary) hypertension: Secondary | ICD-10-CM | POA: Diagnosis not present

## 2016-10-07 LAB — BASIC METABOLIC PANEL
ANION GAP: 10 (ref 5–15)
BUN: 13 mg/dL (ref 6–20)
CALCIUM: 9.8 mg/dL (ref 8.9–10.3)
CO2: 25 mmol/L (ref 22–32)
Chloride: 103 mmol/L (ref 101–111)
Creatinine, Ser: 0.73 mg/dL (ref 0.44–1.00)
GFR calc Af Amer: >60 mL/min (ref >60)
GLUCOSE: 104 mg/dL — AB (ref 65–99)
Potassium: 3.5 mmol/L (ref 3.5–5.1)
SODIUM: 138 mmol/L (ref 135–145)

## 2016-10-07 LAB — CBC
HCT: 40.8 % (ref 36.0–46.0)
Hemoglobin: 13.6 g/dL (ref 12.0–15.0)
MCH: 27.9 pg (ref 26.0–34.0)
MCHC: 33.3 g/dL (ref 30.0–36.0)
MCV: 83.6 fL (ref 78.0–100.0)
PLATELETS: 228 10*3/uL (ref 150–400)
RBC: 4.88 MIL/uL (ref 3.87–5.11)
RDW: 13.2 % (ref 11.5–15.5)
WBC: 4.8 10*3/uL (ref 4.0–10.5)

## 2016-10-07 LAB — PROTIME-INR
INR: 1.01
PROTHROMBIN TIME: 13.3 s (ref 11.4–15.2)

## 2016-10-07 LAB — TROPONIN I: Troponin I: <0.03 ng/mL (ref <0.03)

## 2016-10-07 LAB — GLUCOSE, CAPILLARY: GLUCOSE-CAPILLARY: 114 mg/dL — AB (ref 65–99)

## 2016-10-07 MED ORDER — METOPROLOL SUCCINATE ER 25 MG PO TB24
12.5000 mg | ORAL_TABLET | Freq: Every day | ORAL | Status: DC
  Administered 2016-10-07: 12.5 mg via ORAL
  Filled 2016-10-07: qty 1

## 2016-10-07 MED ORDER — METOPROLOL SUCCINATE ER 25 MG PO TB24: 12.5000 mg | ORAL_TABLET | Freq: Every day | ORAL | 6 refills | Status: DC

## 2016-10-07 NOTE — Plan of Care (Signed)
Problem: Health Behavior/Discharge Planning: Goal: Ability to manage health-related needs will improve Outcome: Completed/Met Date Met: 10/07/16 Pt educated on new & current meds, f/u appointments, when to call MD, & diet.   Problem: Cardiac: Goal: Ability to achieve and maintain adequate cardiovascular perfusion will improve Outcome: Completed/Met Date Met: 10/07/16 Pt educated on when to use nitro & when to call MD. Verified via teachback method

## 2016-10-07 NOTE — Progress Notes (Signed)
Both pt & dgtr at bedside educated on diagnosis, post d/c activity, diet, meds, f/u appointments, & etc. All questions were answered. Education verified via teachback method. All belongings were given to pt. IVs were removed & CCMD notified of pt being d/c'd before removal of telemetry. Hoover Brunette, RN

## 2016-10-07 NOTE — Discharge Summary (Signed)
Discharge Summary    Patient ID: Renee [name],  MRN: [id number], DOB/AGE: 71/24/1947 71 y.o.  Admit date: 10/06/2016 Discharge date: 10/07/2016   Primary Care Provider: Cathlean Cower Primary Cardiologist: Dr. Lovena Le, Dr. Aundra Dubin  Discharge Diagnoses    Principal Problem:   Chest pain Active Problems:   Anxiety state   Chronic pain syndrome   Essential hypertension   Coronary atherosclerosis   Fibromyalgia   Pre-syncope   Allergies Allergies  Allergen Reactions  . Cyclobenzaprine Other (See Comments)    Pt states that this medication does not work for her.    . Azithromycin Other (See Comments)    Cannot take due to prolonged QT  . Adhesive [Tape] Itching, Rash and Other (See Comments)    Reaction:  Blisters  Pt states that she is allergic to the adhesive backing of EKG pads if left on for more than 24 hours   . Moxifloxacin Other (See Comments)    Unknown allergic reaction many years ago  . Quinolones Other (See Comments)    Unknown allergic reaction many years ago     History of Present Illness     Ms Renee [name] is a 71 yo female with a PMH significant for re-entry tachycardia s/p RFA ([facility], 1993), syncope with sinus pauses per loop recorder s/p dual-chamber PPM (2017), HTN, diet-controlled DM, and chronic pain. She presented to [facility] via EMS for sudden onset shoulder blade pain, described as an "angina attack."  Earlier today, she was helping a friend with data entry. She walked to the printer and states she had a sudden onset of pain on top of her shoulder blades bilaterally at 1300 today. She took a SL nitro, but the pain worsened. She became diaphoretic and felt like she might faint. She rested with her head down and called EMS.  She took a second SL nitro which relieved the pain. However, she became nauseous and layed on the ground. She did not vomit, but remained nauseous and diaphoretic. She states she has not eaten today. She states that this was the same feeling she  used to have in 2017 when she had syncopal episodes. She denies shortness of breath and palpitations. She states her vision was blurred during this episode. She reports she has "angina attacks" and "passing out episodes."  Currently she denies chest pain, palpitations, nausea, and diaphoresis. Her only complaint currently is of feeling weak and headache.    Hospital Course     Consultants: None  Pt was admitted for observation to cycle tropnonins for ACS rule out. Troponin x 3  remained negative. EKG stable. Pt denies current chest pain. Pacemaker interrogation demonstrated SVT in 160's on 4/20.  Labs stable.   Patient seen and examined by Dr. Debara Pickett today and was stable for discharge. All follow up has been arranged.  Ruled out for ACS. Pain across back - atypical. Found to have SVT on 4/20 - has a history of this. She is on flecainide and low dose diltiazem. Added low dose Toprol XL 12.5 mg daily.   She has a follow-up with Dr. Aundra Dubin already scheduled for Monday.  _____________  Discharge Vitals Blood pressure 128/68, pulse 62, temperature 98 F (36.7 C), temperature source Oral, resp. rate 18, height 5\' 3"  (1.6 m), weight 156 lb 6.4 oz (70.9 kg), SpO2 100 %.  Filed Weights   10/06/16 1900 10/07/16 0613  Weight: 158 lb 4.8 oz (71.8 kg) 156 lb 6.4 oz (70.9 kg)    Labs &  Radiologic Studies    CBC  Recent Labs  10/06/16 1812 10/07/16 0537  WBC 5.8 4.8  HGB 14.0 13.6  HCT 42.5 40.8  MCV 83.8 83.6  PLT 232 885   Basic Metabolic Panel  Recent Labs  10/06/16 1434 10/06/16 1812 10/07/16 0537  NA 139  --  138  K 3.3*  --  3.5  CL 103  --  103  CO2 27  --  25  GLUCOSE 101*  --  104*  BUN 10  --  13  CREATININE 0.77 0.78 0.73  CALCIUM 10.2  --  9.8   Liver Function Tests  Recent Labs  10/06/16 1812  AST 21  ALT 19  ALKPHOS 88  BILITOT 1.4*  PROT 7.3  ALBUMIN 4.4   No results for input(s): LIPASE, AMYLASE in the last 72 hours. Cardiac Enzymes  Recent  Labs  10/06/16 1812 10/06/16 2258 10/07/16 0537  TROPONINI <0.03 <0.03 <0.03   BNP Invalid input(s): POCBNP D-Dimer No results for input(s): DDIMER in the last 72 hours. Hemoglobin A1C No results for input(s): HGBA1C in the last 72 hours. Fasting Lipid Panel No results for input(s): CHOL, HDL, LDLCALC, TRIG, CHOLHDL, LDLDIRECT in the last 72 hours. Thyroid Function Tests  Recent Labs  10/06/16 1812  TSH 0.951   _____________  Dg Chest 2 View  Result Date: 10/06/2016 CLINICAL DATA:  Initial evaluation for acute chest pain. EXAM: CHEST  2 VIEW COMPARISON:  Prior radiograph from 06/12/2016. FINDINGS: Left-sided transvenous pacemaker/ AICD in place, stable. Mild cardiomegaly, unchanged. Mediastinal silhouette normal. Lungs normally inflated. No focal infiltrate, pulmonary edema, or pleural effusion. No pneumothorax. No acute osseous abnormality. IMPRESSION: Stable appearance of the chest.  No active cardiopulmonary disease. Electronically Signed   By: Jeannine Boga M.D.   On: 10/06/2016 15:19     Diagnostic Studies/Procedures    EKG 10/07/16: atrial paced  Disposition   Pt is being discharged home today in good condition.  Follow-up Plans & Appointments    Follow-up Information    Loralie Champagne, MD Follow up on 10/10/2016.   Specialty:  Cardiology Why:  2:20 pm Contact information: [address] [place] [address] [phone]          Discharge Instructions    Diet - low sodium heart healthy    Complete by:  As directed    Increase activity slowly    Complete by:  As directed       Discharge Medications   Current Discharge Medication List    START taking these medications   Details  metoprolol succinate (TOPROL-XL) 25 MG 24 hr tablet Take 0.5 tablets (12.5 mg total) by mouth daily. Qty: 30 tablet, Refills: 6      CONTINUE these medications which have NOT CHANGED   Details  ALPRAZolam (XANAX) 0.5 MG tablet Take 0.5-1  tablets (0.25-0.5 mg total) by mouth 2 (two) times daily as needed for anxiety. Qty: 90 tablet, Refills: 1    aspirin EC 81 MG tablet Take 81 mg by mouth daily.     diltiazem (CARDIZEM CD) 120 MG 24 hr capsule Take 1 capsule (120 mg total) by mouth every morning. Qty: 90 capsule, Refills: 3    flecainide (TAMBOCOR) 50 MG tablet Take 0.5 tablets (25 mg total) by mouth 2 (two) times daily. Qty: 90 tablet, Refills: 2    naproxen sodium (ALEVE) 220 MG tablet Take 440 mg by mouth 2 (two) times daily as needed (arthritis pain).  nitroGLYCERIN (NITROSTAT) 0.4 MG SL tablet Place 1 tablet (0.4 mg total) under the tongue every 5 (five) minutes as needed for chest pain. Qty: 30 tablet, Refills: 0    omeprazole (PRILOSEC) 40 MG capsule Take 1 capsule (40 mg total) by mouth every evening. Qty: 90 capsule, Refills: 2    potassium chloride (MICRO-K) 10 MEQ CR capsule Take 1 capsule (10 mEq total) by mouth daily. Yearly physical due in March must see MD for refills Qty: 90 capsule, Refills: 0    triamterene-hydrochlorothiazide (DYAZIDE) 37.5-25 MG capsule Take 1 each (1 capsule total) by mouth daily. Qty: 90 capsule, Refills: 3    zolpidem (AMBIEN) 5 MG tablet Take 2.5-5 mg by mouth at bedtime as needed for sleep.  Qty: 30 tablet, Refills: 0    glucose blood (ACCU-CHEK SMARTVIEW) test strip 1 each by Other route 2 (two) times daily. Use to check blood sugars twice a day Dx e11.9 Qty: 300 each, Refills: 2    Lancets (ACCU-CHEK MULTICLIX) lancets 1 each by Other route 2 (two) times daily. Use to help check blood sugars twice a day Dx e11.9 Qty: 1 each, Refills: 0          Outstanding Labs/Studies   Consider repeat lipid panel.   Duration of Discharge Encounter   Greater than 30 minutes including physician time.  Signed, Tami Lin [facility] PA-C 10/07/2016, 10:15 AM

## 2016-10-07 NOTE — Telephone Encounter (Signed)
Pt was on TCM list admitted 10/06/16 for chest pain. Pt was d/c 4/27, and will follow-up w/cardiology on 10/10/16 w/Dr. Kirk Ruths...Renee [name]

## 2016-10-07 NOTE — Care Management Note (Signed)
Case Management Note  Patient Details  Name: Renee [name] MRN: [id number] Date of Birth: 1945/08/17  Subjective/Objective:                 Patient in obs for CP. Patient with order to DC to home today. No CM consult, orders, or needs identified at this time.    Action/Plan:  DC to home.  Expected Discharge Date:  10/07/16               Expected Discharge Plan:  Home/Self Care  In-House Referral:     Discharge planning Services  CM Consult  Post Acute Care Choice:    Choice offered to:     DME Arranged:    DME Agency:     HH Arranged:    HH Agency:     Status of Service:  Completed, signed off  If discussed at [employer] of Stay Meetings, dates discussed:    Additional Comments:  Carles Collet, RN 10/07/2016, 10:29 AM

## 2016-10-07 NOTE — Progress Notes (Signed)
DAILY PROGRESS NOTE   Patient Name: Renee [name] Date of Encounter: 10/07/2016  Hospital Problem List   Active Problems:   Chest pain    Chief Complaint  No chest pain overnight  Subjective   Ruled-out for ACS overnight. Pacemaker interrogation demonstrated SVT in 160's on 4/20.  Labs stable.   Objective   Vitals:   10/06/16 1900 10/06/16 1939 10/07/16 0613 10/07/16 0800  BP: 120/66 127/78 119/65 128/68  Pulse: 61 75 62   Resp:  16 18   Temp: 99.4 F (37.4 C) 98.8 F (37.1 C) 98 F (36.7 C)   TempSrc: Oral Oral Oral   SpO2: 97% 96% 100%   Weight: 158 lb 4.8 oz (71.8 kg)  156 lb 6.4 oz (70.9 kg)   Height: _0  (1.6 m)       Intake/Output Summary (Last 24 hours) at 10/07/16 0902 Last data filed at 10/06/16 2015  Gross per 24 hour  Intake              240 ml  Output                0 ml  Net              240 ml   Filed Weights   10/06/16 1900 10/07/16 8527  Weight: 158 lb 4.8 oz (71.8 kg) 156 lb 6.4 oz (70.9 kg)    Physical Exam   General appearance: alert and no distress Lungs: clear to auscultation bilaterally Heart: regular rate and rhythm, S1, S2 normal, no murmur, click, rub or gallop Extremities: extremities normal, atraumatic, no cyanosis or edema Neurologic: Grossly normal  Inpatient Medications    Scheduled Meds: . aspirin  324 mg Oral NOW   Or  . aspirin  300 mg Rectal NOW  . aspirin EC  81 mg Oral Daily  . atorvastatin  40 mg Oral q1800  . diltiazem  120 mg Oral BH-q7a  . flecainide  25 mg Oral BID  . heparin  5,000 Units Subcutaneous Q8H  . insulin aspart  0-15 Units Subcutaneous TID WC  . insulin aspart  0-5 Units Subcutaneous QHS  . pantoprazole  40 mg Oral Daily  . potassium chloride  10 mEq Oral Daily  . triamterene-hydrochlorothiazide  1 capsule Oral Daily    Continuous Infusions: . sodium chloride 20 mL/hr at 10/07/16 0628    PRN Meds: acetaminophen, ALPRAZolam, nitroGLYCERIN, ondansetron (ZOFRAN) IV, zolpidem   Labs    Results for orders placed or performed during the hospital encounter of 10/06/16 (from the past 48 hour(s))  Basic metabolic panel     Status: Abnormal   Collection Time: 10/06/16  2:34 PM  Result Value Ref Range   Sodium 139 135 - 145 mmol/L   Potassium 3.3 (L) 3.5 - 5.1 mmol/L   Chloride 103 101 - 111 mmol/L   CO2 27 22 - 32 mmol/L   Glucose, Bld 101 (H) 65 - 99 mg/dL   BUN 10 6 - 20 mg/dL   Creatinine, Ser 0.77 0.44 - 1.00 mg/dL   Calcium 10.2 8.9 - 10.3 mg/dL   GFR calc non Af Amer >60 >60 mL/min   GFR calc Af Amer >60 >60 mL/min    Comment: (NOTE) The eGFR has been calculated using the CKD EPI equation. This calculation has not been validated in all clinical situations. eGFR's persistently <60 mL/min signify possible Chronic Kidney Disease.    Anion gap 9 5 - 15  CBC  Status: None   Collection Time: 10/06/16  2:34 PM  Result Value Ref Range   WBC 5.5 4.0 - 10.5 K/uL   RBC 5.06 3.87 - 5.11 MIL/uL   Hemoglobin 14.2 12.0 - 15.0 g/dL   HCT 42.3 36.0 - 46.0 %   MCV 83.6 78.0 - 100.0 fL   MCH 28.1 26.0 - 34.0 pg   MCHC 33.6 30.0 - 36.0 g/dL   RDW 13.3 11.5 - 15.5 %   Platelets 233 150 - 400 K/uL  I-stat troponin, ED     Status: None   Collection Time: 10/06/16  3:05 PM  Result Value Ref Range   Troponin i, poc 0.00 0.00 - 0.08 ng/mL   Comment 3            Comment: Due to the release kinetics of cTnI, a negative result within the first hours of the onset of symptoms does not rule out myocardial infarction with certainty. If myocardial infarction is still suspected, repeat the test at appropriate intervals.   CBC     Status: None   Collection Time: 10/06/16  6:12 PM  Result Value Ref Range   WBC 5.8 4.0 - 10.5 K/uL   RBC 5.07 3.87 - 5.11 MIL/uL   Hemoglobin 14.0 12.0 - 15.0 g/dL   HCT 42.5 36.0 - 46.0 %   MCV 83.8 78.0 - 100.0 fL   MCH 27.6 26.0 - 34.0 pg   MCHC 32.9 30.0 - 36.0 g/dL   RDW 13.5 11.5 - 15.5 %   Platelets 232 150 - 400 K/uL  Creatinine,  serum     Status: None   Collection Time: 10/06/16  6:12 PM  Result Value Ref Range   Creatinine, Ser 0.78 0.44 - 1.00 mg/dL   GFR calc non Af Amer >60 >60 mL/min   GFR calc Af Amer >60 >60 mL/min    Comment: (NOTE) The eGFR has been calculated using the CKD EPI equation. This calculation has not been validated in all clinical situations. eGFR's persistently <60 mL/min signify possible Chronic Kidney Disease.   TSH     Status: None   Collection Time: 10/06/16  6:12 PM  Result Value Ref Range   TSH 0.951 0.350 - 4.500 uIU/mL    Comment: Performed by a 3rd Generation assay with a functional sensitivity of <=0.01 uIU/mL.  Hepatic function panel     Status: Abnormal   Collection Time: 10/06/16  6:12 PM  Result Value Ref Range   Total Protein 7.3 6.5 - 8.1 g/dL   Albumin 4.4 3.5 - 5.0 g/dL   AST 21 15 - 41 U/L   ALT 19 14 - 54 U/L   Alkaline Phosphatase 88 38 - 126 U/L   Total Bilirubin 1.4 (H) 0.3 - 1.2 mg/dL   Bilirubin, Direct 0.2 0.1 - 0.5 mg/dL   Indirect Bilirubin 1.2 (H) 0.3 - 0.9 mg/dL  Troponin I     Status: None   Collection Time: 10/06/16  6:12 PM  Result Value Ref Range   Troponin I <0.03 <0.03 ng/mL  Glucose, capillary     Status: Abnormal   Collection Time: 10/06/16  9:01 PM  Result Value Ref Range   Glucose-Capillary 186 (H) 65 - 99 mg/dL  Troponin I     Status: None   Collection Time: 10/06/16 10:58 PM  Result Value Ref Range   Troponin I <0.03 <0.03 ng/mL  CBC     Status: None   Collection Time: 10/07/16  5:37 AM  Result Value Ref Range   WBC 4.8 4.0 - 10.5 K/uL   RBC 4.88 3.87 - 5.11 MIL/uL   Hemoglobin 13.6 12.0 - 15.0 g/dL   HCT 40.8 36.0 - 46.0 %   MCV 83.6 78.0 - 100.0 fL   MCH 27.9 26.0 - 34.0 pg   MCHC 33.3 30.0 - 36.0 g/dL   RDW 13.2 11.5 - 15.5 %   Platelets 228 150 - 400 K/uL  Basic metabolic panel     Status: Abnormal   Collection Time: 10/07/16  5:37 AM  Result Value Ref Range   Sodium 138 135 - 145 mmol/L   Potassium 3.5 3.5 - 5.1  mmol/L   Chloride 103 101 - 111 mmol/L   CO2 25 22 - 32 mmol/L   Glucose, Bld 104 (H) 65 - 99 mg/dL   BUN 13 6 - 20 mg/dL   Creatinine, Ser 0.73 0.44 - 1.00 mg/dL   Calcium 9.8 8.9 - 10.3 mg/dL   GFR calc non Af Amer >60 >60 mL/min   GFR calc Af Amer >60 >60 mL/min    Comment: (NOTE) The eGFR has been calculated using the CKD EPI equation. This calculation has not been validated in all clinical situations. eGFR's persistently <60 mL/min signify possible Chronic Kidney Disease.    Anion gap 10 5 - 15  Troponin I     Status: None   Collection Time: 10/07/16  5:37 AM  Result Value Ref Range   Troponin I <0.03 <0.03 ng/mL  Protime-INR     Status: None   Collection Time: 10/07/16  5:37 AM  Result Value Ref Range   Prothrombin Time 13.3 11.4 - 15.2 seconds   INR 1.01   Glucose, capillary     Status: Abnormal   Collection Time: 10/07/16  7:47 AM  Result Value Ref Range   Glucose-Capillary 114 (H) 65 - 99 mg/dL    ECG   Atrial paced at 71 - personally reviewed  Telemetry   Atrial paced - personally reviewed  Radiology    Dg Chest 2 View  Result Date: 10/06/2016 CLINICAL DATA:  Initial evaluation for acute chest pain. EXAM: CHEST  2 VIEW COMPARISON:  Prior radiograph from 06/12/2016. FINDINGS: Left-sided transvenous pacemaker/ AICD in place, stable. Mild cardiomegaly, unchanged. Mediastinal silhouette normal. Lungs normally inflated. No focal infiltrate, pulmonary edema, or pleural effusion. No pneumothorax. No acute osseous abnormality. IMPRESSION: Stable appearance of the chest.  No active cardiopulmonary disease. Electronically Signed   By: Jeannine Boga M.D.   On: 10/06/2016 15:19    Cardiac Studies   None  Assessment   1. Active Problems: 2.   Chest pain 3. Symptomatic SVT  Plan   1. Ruled out for ACS. Pain across back - atypical. Found to have SVT on 4/20 - has a history of this. She is on flecainide and low dose diltiazem. Will add low dose Toprol XL  12.5 mg daily. [place] for d/c home today. She has a follow-up with Dr. Aundra Dubin already scheduled for Monday.  Time Spent Directly with Patient:  15 minutes  Length of Stay:  LOS: 0 days   Pixie Casino, MD, [place]  Attending Cardiologist  Direct Dial: [phone]  Fax: [phone]  Website:  [email or web address] 10/07/2016, 9:02 AM

## 2016-10-08 LAB — HEMOGLOBIN A1C
HEMOGLOBIN A1C: 5.7 % — AB (ref 4.8–5.6)
MEAN PLASMA GLUCOSE: 117 mg/dL

## 2016-10-09 ENCOUNTER — Emergency Department (HOSPITAL_COMMUNITY): Payer: Medicare Other

## 2016-10-09 ENCOUNTER — Encounter (HOSPITAL_COMMUNITY): Payer: Self-pay | Admitting: Emergency Medicine

## 2016-10-09 ENCOUNTER — Emergency Department (HOSPITAL_COMMUNITY)
Admission: EM | Admit: 2016-10-09 | Discharge: 2016-10-09 | Disposition: A | Discharge status: Home / Self Care | Payer: Medicare Other | Attending: Emergency Medicine | Admitting: Emergency Medicine

## 2016-10-09 DIAGNOSIS — N390 Urinary tract infection, site not specified: Secondary | ICD-10-CM | POA: Insufficient documentation

## 2016-10-09 DIAGNOSIS — Z79899 Other long term (current) drug therapy: Secondary | ICD-10-CM | POA: Diagnosis not present

## 2016-10-09 DIAGNOSIS — Z96642 Presence of left artificial hip joint: Secondary | ICD-10-CM | POA: Diagnosis not present

## 2016-10-09 DIAGNOSIS — R109 Unspecified abdominal pain: Secondary | ICD-10-CM | POA: Diagnosis not present

## 2016-10-09 DIAGNOSIS — R1084 Generalized abdominal pain: Secondary | ICD-10-CM | POA: Diagnosis not present

## 2016-10-09 DIAGNOSIS — I10 Essential (primary) hypertension: Secondary | ICD-10-CM | POA: Diagnosis not present

## 2016-10-09 DIAGNOSIS — R079 Chest pain, unspecified: Secondary | ICD-10-CM | POA: Diagnosis not present

## 2016-10-09 LAB — CBC WITH DIFFERENTIAL/PLATELET
BASOS PCT: 0 %
Basophils Absolute: 0 10*3/uL (ref 0.0–0.1)
EOS PCT: 1 %
Eosinophils Absolute: 0.1 10*3/uL (ref 0.0–0.7)
HCT: 41 % (ref 36.0–46.0)
HEMOGLOBIN: 13.4 g/dL (ref 12.0–15.0)
LYMPHS ABS: 2.3 10*3/uL (ref 0.7–4.0)
LYMPHS PCT: 41 %
MCH: 27.7 pg (ref 26.0–34.0)
MCHC: 32.7 g/dL (ref 30.0–36.0)
MCV: 84.9 fL (ref 78.0–100.0)
Monocytes Absolute: 0.3 10*3/uL (ref 0.1–1.0)
Monocytes Relative: 6 %
NEUTROS ABS: 3 10*3/uL (ref 1.7–7.7)
Neutrophils Relative %: 52 %
PLATELETS: 211 10*3/uL (ref 150–400)
RBC: 4.83 MIL/uL (ref 3.87–5.11)
RDW: 13.1 % (ref 11.5–15.5)
WBC: 5.7 10*3/uL (ref 4.0–10.5)

## 2016-10-09 LAB — URINALYSIS, ROUTINE W REFLEX MICROSCOPIC
Bilirubin Urine: NEGATIVE
GLUCOSE, UA: NEGATIVE mg/dL
Hgb urine dipstick: NEGATIVE
KETONES UR: NEGATIVE mg/dL
NITRITE: NEGATIVE
PH: 6 (ref 5.0–8.0)
Protein, ur: NEGATIVE mg/dL
SPECIFIC GRAVITY, URINE: 1.019 (ref 1.005–1.030)

## 2016-10-09 LAB — COMPREHENSIVE METABOLIC PANEL
ALT: 16 U/L (ref 14–54)
AST: 17 U/L (ref 15–41)
Albumin: 3.9 g/dL (ref 3.5–5.0)
Alkaline Phosphatase: 77 U/L (ref 38–126)
Anion gap: 7 (ref 5–15)
BUN: 11 mg/dL (ref 6–20)
CHLORIDE: 105 mmol/L (ref 101–111)
CO2: 26 mmol/L (ref 22–32)
Calcium: 9.2 mg/dL (ref 8.9–10.3)
Creatinine, Ser: 0.77 mg/dL (ref 0.44–1.00)
GFR calc Af Amer: >60 mL/min (ref >60)
GFR calc non Af Amer: >60 mL/min (ref >60)
GLUCOSE: 91 mg/dL (ref 65–99)
POTASSIUM: 3.5 mmol/L (ref 3.5–5.1)
Sodium: 138 mmol/L (ref 135–145)
Total Bilirubin: 1.3 mg/dL — ABNORMAL HIGH (ref 0.3–1.2)
Total Protein: 6.4 g/dL — ABNORMAL LOW (ref 6.5–8.1)

## 2016-10-09 LAB — LIPASE, BLOOD: Lipase: 25 U/L (ref 11–51)

## 2016-10-09 LAB — I-STAT TROPONIN, ED: TROPONIN I, POC: 0 ng/mL (ref 0.00–0.08)

## 2016-10-09 MED ORDER — SODIUM CHLORIDE 0.9 % IV SOLN
INTRAVENOUS | Status: DC
  Administered 2016-10-09: 13:00:00 via INTRAVENOUS

## 2016-10-09 MED ORDER — IOPAMIDOL (ISOVUE-300) INJECTION 61%
INTRAVENOUS | Status: AC
  Administered 2016-10-09: 100 mL
  Filled 2016-10-09: qty 100

## 2016-10-09 MED ORDER — FENTANYL CITRATE (PF) 100 MCG/2ML IJ SOLN: 50.0000 ug | INTRAMUSCULAR | Status: DC | PRN

## 2016-10-09 MED ORDER — CEPHALEXIN 250 MG PO CAPS
500.0000 mg | ORAL_CAPSULE | Freq: Once | ORAL | Status: AC
  Administered 2016-10-09: 500 mg via ORAL
  Filled 2016-10-09: qty 2

## 2016-10-09 MED ORDER — CEPHALEXIN 500 MG PO CAPS: 500.0000 mg | ORAL_CAPSULE | Freq: Four times a day (QID) | ORAL | 0 refills | Status: DC

## 2016-10-09 MED ORDER — FAMOTIDINE IN NACL 20-0.9 MG/50ML-% IV SOLN
20.0000 mg | Freq: Once | INTRAVENOUS | Status: DC
  Filled 2016-10-09: qty 50

## 2016-10-09 NOTE — ED Notes (Signed)
Patient transported to X-ray 

## 2016-10-09 NOTE — ED Notes (Signed)
Notified CT pt ready for transport with established IV.

## 2016-10-09 NOTE — Discharge Instructions (Signed)
Take the prescription as directed.  Continue to take your usual prescriptions as previously directed. Call your regular medical doctor and your GI doctor tomorrow to schedule a follow up appointment this week. Keep your Cardiologist appointment tomorrow.  Return to the Emergency Department immediately sooner if worsening.

## 2016-10-09 NOTE — ED Notes (Signed)
Pt verbalizes DC teaching reporting she will follow up with her cardiologist tomorrow. NAD. VSS. Pt driven home by family.

## 2016-10-09 NOTE — ED Triage Notes (Signed)
Pt. Stated, I started having chest pain and its all on my stomach going up into my chest.  My back is also hurting. This started 2 days

## 2016-10-09 NOTE — ED Provider Notes (Signed)
[facility] DEPT Provider Note   CSN: [id number] Arrival date & time: 10/09/16  1157     History   Chief Complaint Chief Complaint  Patient presents with  . Chest Pain  . Abdominal Pain  . Back Pain  . Chills    HPI Renee [name] is a 71 y.o. female.  HPI  Pt was seen at 1210.  Per pt, c/o gradual onset and persistence of constant abd "pain" for the past 2 to 3 days.  Has been associated with no other symptoms. Describes the abd pain as "inside of me" and radiating into her back. Denies N/V, no diarrhea, no fevers, no back pain, no rash, no CP/SOB, no black or blood in stools.      Past Medical History:  Diagnosis Date  . Anxiety   . AV nodal re-entry tachycardia [facility])    a. s/p RFA 1993- Dr. Rolland Porter, [place]  . Broken neck ([place])    1993  . Cancer (HCC)    hx of cervical   ca  . Chronic chest pain    a. 2005 Cath: Mild nonobstructive plaque (30-40% LCX);  b. 08/2011 low risk myoview;  c. 09/2011 Coronary CT: NL cors w ca score of 0.  . Chronic pain syndrome   . Complication of anesthesia    blood pressure and heart dropped in 2009    . Concussion    1979, 1993  . Degenerative joint disease    a. s/p L Total Knee Arthroplasty.  . Diet-controlled type 2 diabetes mellitus ([place])   . Diverticulosis of colon   . Duodenitis without hemorrhage   . Fibromyalgia   . GERD (gastroesophageal reflux disease)   . Hemorrhoids   . Hypertension   . Irritable bowel syndrome   . Long QT interval    a. mild - advised to avoid meds that may prolong QT.  Marland Kitchen Neuropathy   . Osteoporosis   . SVT (supraventricular tachycardia) (HCC)    a. nonsustained SVT - previously offered flecainide but refused;  b. 12/2012 30 day event monitor w/o significant arrhythmias.  . Thyroid nodule     Patient Active Problem List   Diagnosis Date Noted  . Laryngopharyngeal reflux (LPR) 08/18/2016  . Cough 06/24/2016  . Wheezing 06/24/2016  . Acute upper respiratory infection 05/17/2016  . Syncope  03/03/2016  . Pacemaker 03/03/2016  . Chest pain at rest 09/13/2015  . Spinal stenosis of lumbar region 09/07/2015  . Raynaud phenomenon 09/07/2015  . Insomnia 09/07/2015  . Wellness examination 12/18/2014  . Paroxysmal spells 01/28/2014  . Dysthymia 01/28/2014  . Injury of leg 10/07/2013  . Headache(784.0) 03/14/2013  . Eye problem 02/15/2013  . Persistent headaches 02/15/2013  . Pre-syncope 10/04/2011  . NSVT (nonsustained ventricular tachycardia) ([place]) 10/04/2011  . DYSPNEA 10/01/2009  . Diverticulosis of large intestine 09/24/2009  . SWEATING 09/24/2009  . FATIQUE AND MALAISE 09/16/2009  . LONG QT SYNDROME 03/19/2009  . AV NODAL REENTRY TACHYCARDIA 10/03/2008  . Other dysphagia 06/17/2008  . HEMORRHOIDS 06/16/2008  . DUODENITIS, WITHOUT HEMORRHAGE 06/16/2008  . CONSTIPATION, CHRONIC 06/16/2008  . THYROID NODULE, right lobe. 05/26/2008  . Chronic pain syndrome 05/26/2008  . Coronary atherosclerosis 05/26/2008  . BRONCHITIS, RECURRENT 05/26/2008  . Irritable bowel syndrome 05/26/2008  . FECAL INCONTINENCE 05/26/2008  . Anxiety state 03/24/2008  . Essential hypertension 03/24/2008  . GERD 03/24/2008  . DEGENERATIVE JOINT DISEASE 03/24/2008  . BACK PAIN, LUMBAR 03/24/2008  . Fibromyalgia 03/24/2008  . Chest pain 03/24/2008  .  NEUROPATHY, HX OF 03/24/2008    Past Surgical History:  Procedure Laterality Date  . ABLATION OF DYSRHYTHMIC FOCUS  1993  . BIOPSY THYROID    . CARDIAC ELECTROPHYSIOLOGY STUDY AND ABLATION     atrioventricular nodal reentant tachycardia  . CHOLECYSTECTOMY  1980  . COLONOSCOPY  06/25/2008   Diverticulosis and Hemorrhoids  . COLPOSCOPY    . CORONARY ANGIOPLASTY  2007  . EP IMPLANTABLE DEVICE N/A 03/03/2016   Procedure: Pacemaker Implant;  Surgeon: Evans Lance, MD;  Location: [facility];  Service: Cardiovascular;  Laterality: N/A;  . ESOPHAGOGASTRODUODENOSCOPY    . HYSTEROSCOPY  2002   with resection of endometrial polyps. by  Dr.MCPhail  . HYSTEROSCOPY W/D&C N/A 02/05/2015   Procedure: DILATATION AND CURETTAGE /HYSTEROSCOPY;  Surgeon: Vanessa Kick, MD;  Location: [facility];  Service: Gynecology;  Laterality: N/A;  . LOOP RECORDER IMPLANT  08/21/2013   MDT LinQ implanted by Dr Lovena Le for syncope  . LOOP RECORDER IMPLANT N/A 08/21/2013   Procedure: LOOP RECORDER IMPLANT;  Surgeon: Evans Lance, MD;  Location[facility] LAB;  Service: Cardiovascular;  Laterality: N/A;  . ORIF RADIAL HEAD / NECK FRACTURE  09/05/88  . TILT TABLE STUDY N/A 05/15/2013   Procedure: TILT TABLE STUDY;  Surgeon: Deboraha Sprang, MD;  Location: [facility] C[facility] LAB;  Service: Cardiovascular;  Laterality: N/A;  . TOTAL HIP ARTHROPLASTY  10/10   Left, at [facility] History    Gravida Para Term Preterm AB Living   2 2       2    SAB TAB Ectopic Multiple Live Births                   Home Medications    Prior to Admission medications   Medication Sig Start Date End Date Taking? Authorizing Provider  ALPRAZolam Duanne Moron) 0.5 MG tablet Take 0.5-1 tablets (0.25-0.5 mg total) by mouth 2 (two) times daily as needed for anxiety. Patient taking differently: Take 0.25 mg by mouth daily as needed for anxiety.  02/04/15   Biagio Borg, MD  aspirin EC 81 MG tablet Take 81 mg by mouth daily.     Historical Provider, MD  diltiazem (CARDIZEM CD) 120 MG 24 hr capsule Take 1 capsule (120 mg total) by mouth every morning. Patient taking differently: Take 120 mg by mouth daily.  08/01/16   Evans Lance, MD  flecainide (TAMBOCOR) 50 MG tablet Take 0.5 tablets (25 mg total) by mouth 2 (two) times daily. 08/01/16   Evans Lance, MD  glucose blood (ACCU-CHEK SMARTVIEW) test strip 1 each by Other route 2 (two) times daily. Use to check blood sugars twice a day Dx e11.9 10/06/15   Biagio Borg, MD  Lancets (ACCU-CHEK MULTICLIX) lancets 1 each by Other route 2 (two) times daily. Use to help check blood sugars twice a day Dx e11.9 10/06/15   Biagio Borg, MD  metoprolol  succinate (TOPROL-XL) 25 MG 24 hr tablet Take 0.5 tablets (12.5 mg total) by mouth daily. 10/07/16   Tami Lin Duke, PA  naproxen sodium (ALEVE) 220 MG tablet Take 440 mg by mouth 2 (two) times daily as needed (arthritis pain).    Historical Provider, MD  nitroGLYCERIN (NITROSTAT) 0.4 MG SL tablet Place 1 tablet (0.4 mg total) under the tongue every 5 (five) minutes as needed for chest pain. 02/03/15   Biagio Borg, MD  omeprazole (PRILOSEC) 40 MG capsule Take 1 capsule (40  mg total) by mouth every evening. Patient taking differently: Take 40 mg by mouth every evening. 5pm 05/13/15   Ladene Artist, MD  potassium chloride (MICRO-K) 10 MEQ CR capsule Take 1 capsule (10 mEq total) by mouth daily. Yearly physical due in March must see MD for refills 07/28/16   Biagio Borg, MD  triamterene-hydrochlorothiazide (DYAZIDE) 37.5-25 MG capsule Take 1 each (1 capsule total) by mouth daily. 07/28/16   Jolaine Artist, MD  zolpidem (AMBIEN) 5 MG tablet Take 2.5-5 mg by mouth at bedtime as needed for sleep.  04/12/16   Larey Dresser, MD    Family History Family History  Problem Relation Age of Onset  . Ovarian cancer Mother   . Stroke Mother   . Diabetes Mother   . Asthma Mother   . Lung cancer Father     Heart problems  . Heart disease Sister   . Diabetes Brother   . Diabetes Sister     Pacemaker, CHF  . Heart disease Sister   . Asthma Sister     Social History Social History  Substance Use Topics  . Smoking status: Never Smoker  . Smokeless tobacco: Never Used  . Alcohol use Yes     Comment: Occasional      Allergies   Cyclobenzaprine; Azithromycin; Adhesive [tape]; Moxifloxacin; and Quinolones   Review of Systems Review of Systems ROS: Statement: All systems negative except as marked or noted in the HPI; Constitutional: Negative for fever and chills. ; ; Eyes: Negative for eye pain, redness and discharge. ; ; ENMT: Negative for ear pain, hoarseness, nasal congestion, sinus  pressure and sore throat. ; ; Cardiovascular: Negative for chest pain, palpitations, diaphoresis, dyspnea and peripheral edema. ; ; Respiratory: Negative for cough, wheezing and stridor. ; ; Gastrointestinal: +abd pain. Negative for nausea, vomiting, diarrhea, blood in stool, hematemesis, jaundice and rectal bleeding. . ; ; Genitourinary: Negative for dysuria, flank pain and hematuria. ; ; Musculoskeletal: Negative for back pain and neck pain. Negative for swelling and trauma.; ; Skin: Negative for pruritus, rash, abrasions, blisters, bruising and skin lesion.; ; Neuro: Negative for headache, lightheadedness and neck stiffness. Negative for weakness, altered level of consciousness, altered mental status, extremity weakness, paresthesias, involuntary movement, seizure and syncope.       Physical Exam Updated Vital Signs BP 128/85   Pulse 76   Temp 98.4 F (36.9 C) (Oral)   Resp 14   Ht 5\' 3"  (1.6 m)   Wt 156 lb (70.8 kg)   SpO2 99%   BMI 27.63 kg/m    Patient Vitals for the past 24 hrs:  BP Temp Temp src Pulse Resp SpO2 Height Weight  10/09/16 1518 125/85 - - - 15 - - -  10/09/16 1315 125/75 - - 69 15 96 % - -  10/09/16 1245 118/81 - - 69 17 100 % - -  10/09/16 1230 138/85 - - 65 19 100 % - -  10/09/16 1216 - - - 76 14 99 % - -  10/09/16 1215 128/85 - - - - - - -  10/09/16 1206 - 98.4 F (36.9 C) Oral - - - - -  10/09/16 1202 137/79 - - 78 17 98 % 5\' 3"  (1.6 m) 156 lb (70.8 kg)     Physical Exam 1215: Physical examination:  Nursing notes reviewed; Vital signs and O2 SAT reviewed;  Constitutional: Well developed, Well nourished, Well hydrated, In no acute distress; Head:  Normocephalic, atraumatic; Eyes: EOMI,  PERRL, No scleral icterus; ENMT: Mouth and pharynx normal, Mucous membranes moist; Neck: Supple, Full range of motion, No lymphadenopathy; Cardiovascular: Regular rate and rhythm, No gallop; Respiratory: Breath sounds clear & equal bilaterally, No wheezes.  Speaking full  sentences with ease, Normal respiratory effort/excursion; Chest: Nontender, Movement normal; Abdomen: Soft, +diffuse tenderness to palp. No rebound or guarding. Nondistended, Normal bowel sounds; Genitourinary: No CVA tenderness; Extremities: Pulses normal, No tenderness, No edema, No calf edema or asymmetry.; Neuro: AA&Ox3, Major CN grossly intact.  Speech clear. No gross focal motor or sensory deficits in extremities.; Skin: Color normal, Warm, Dry.; Psych:  Anxious, rapid/pressured speech.      ED Treatments / Results  Labs (all labs ordered are listed, but only abnormal results are displayed)   EKG  EKG Interpretation  Date/Time:  Sunday October 09 2016 12:05:29 EDT Ventricular Rate:  74 PR Interval:  166 QRS Duration: 86 QT Interval:  396 QTC Calculation: 439 R Axis:   47 Text Interpretation:  Normal sinus rhythm Cannot rule out Anterior infarct , age undetermined Artifact When compared with ECG of 10/07/2016 Normal sinus rhythm has replaced Atrial-paced rhythm Confirmed by [facility]  MD, Nunzio Cory [phone]) on 10/09/2016 12:22:34 PM       Radiology   Procedures Procedures (including critical care time)  Medications Ordered in ED Medications  famotidine (PEPCID) IVPB 20 mg premix (not administered)  0.9 %  sodium chloride infusion (not administered)  fentaNYL (SUBLIMAZE) injection 50 mcg (not administered)     Initial Impression / Assessment and Plan / ED Course  I have reviewed the triage vital signs and the nursing notes.  Pertinent labs & imaging results that were available during my care of the patient were reviewed by me and considered in my medical decision making (see chart for details).  MDM Reviewed: nursing note, previous chart and vitals Reviewed previous: labs and ECG Interpretation: labs, ECG, x-ray and CT scan   Results for orders placed or performed during the hospital encounter of 10/09/16  Comprehensive metabolic panel  Result Value Ref Range   Sodium  138 135 - 145 mmol/L   Potassium 3.5 3.5 - 5.1 mmol/L   Chloride 105 101 - 111 mmol/L   CO2 26 22 - 32 mmol/L   Glucose, Bld 91 65 - 99 mg/dL   BUN 11 6 - 20 mg/dL   Creatinine, Ser 0.77 0.44 - 1.00 mg/dL   Calcium 9.2 8.9 - 10.3 mg/dL   Total Protein 6.4 (L) 6.5 - 8.1 g/dL   Albumin 3.9 3.5 - 5.0 g/dL   AST 17 15 - 41 U/L   ALT 16 14 - 54 U/L   Alkaline Phosphatase 77 38 - 126 U/L   Total Bilirubin 1.3 (H) 0.3 - 1.2 mg/dL   GFR calc non Af Amer >60 >60 mL/min   GFR calc Af Amer >60 >60 mL/min   Anion gap 7 5 - 15  Lipase, blood  Result Value Ref Range   Lipase 25 11 - 51 U/L  CBC with Differential  Result Value Ref Range   WBC 5.7 4.0 - 10.5 K/uL   RBC 4.83 3.87 - 5.11 MIL/uL   Hemoglobin 13.4 12.0 - 15.0 g/dL   HCT 41.0 36.0 - 46.0 %   MCV 84.9 78.0 - 100.0 fL   MCH 27.7 26.0 - 34.0 pg   MCHC 32.7 30.0 - 36.0 g/dL   RDW 13.1 11.5 - 15.5 %   Platelets 211 150 - 400 K/uL   Neutrophils  Relative % 52 %   Neutro Abs 3.0 1.7 - 7.7 K/uL   Lymphocytes Relative 41 %   Lymphs Abs 2.3 0.7 - 4.0 K/uL   Monocytes Relative 6 %   Monocytes Absolute 0.3 0.1 - 1.0 K/uL   Eosinophils Relative 1 %   Eosinophils Absolute 0.1 0.0 - 0.7 K/uL   Basophils Relative 0 %   Basophils Absolute 0.0 0.0 - 0.1 K/uL  Urinalysis, Routine w reflex microscopic  Result Value Ref Range   Color, Urine YELLOW YELLOW   APPearance HAZY (A) CLEAR   Specific Gravity, Urine 1.019 1.005 - 1.030   pH 6.0 5.0 - 8.0   Glucose, UA NEGATIVE NEGATIVE mg/dL   Hgb urine dipstick NEGATIVE NEGATIVE   Bilirubin Urine NEGATIVE NEGATIVE   Ketones, ur NEGATIVE NEGATIVE mg/dL   Protein, ur NEGATIVE NEGATIVE mg/dL   Nitrite NEGATIVE NEGATIVE   Leukocytes, UA LARGE (A) NEGATIVE   RBC / HPF 0-5 0 - 5 RBC/hpf   WBC, UA 6-30 0 - 5 WBC/hpf   Bacteria, UA RARE (A) NONE SEEN   Squamous Epithelial / LPF 0-5 (A) NONE SEEN   Mucous PRESENT    Non Squamous Epithelial 0-5 (A) NONE SEEN  I-stat troponin, ED  Result Value Ref  Range   Troponin i, poc 0.00 0.00 - 0.08 ng/mL   Comment 3           Dg Chest 2 View Result Date: 10/09/2016 CLINICAL DATA:  Patient reports she had an on set of mid chest and back pain. Reports in the month of march her blood pressure was low a few times back to back. EXAM: CHEST  2 VIEW COMPARISON:  10/06/2016 FINDINGS: There is no focal parenchymal opacity. There is no pleural effusion or pneumothorax. The heart and mediastinal contours are unremarkable. There is a dual lead cardiac pacemaker. The osseous structures are unremarkable. IMPRESSION: No active cardiopulmonary disease. Electronically Signed   By: Kathreen Devoid   On: 10/09/2016 12:57   Ct Abdomen Pelvis W Contrast Result Date: 10/09/2016 CLINICAL DATA:  Abdominal pain 3 days. EXAM: CT ABDOMEN AND PELVIS WITH CONTRAST TECHNIQUE: Multidetector CT imaging of the abdomen and pelvis was performed using the standard protocol following bolus administration of intravenous contrast. CONTRAST:  127mL ISOVUE-300 IOPAMIDOL (ISOVUE-300) INJECTION 61% COMPARISON:  01/29/2016 FINDINGS: Lower chest: No acute abnormality. Hepatobiliary: Several low-density liver lesions are noted, stable from the prior CT, largest in the lateral segment of the left lobe measuring 2.3 cm, all consistent with cysts. No other liver abnormality. Gallbladder surgically absent. Mild chronic dilation of the common bile duct to a maximum of 1 cm. Pancreas: Unremarkable. No pancreatic ductal dilatation or surrounding inflammatory changes. Spleen: Normal in size without focal abnormality. Adrenals/Urinary Tract: No adrenal masses. Normal kidneys. Ureters normal course and in caliber. Bladder is not well visualized due to artifact from bilateral hip prosthesis. No gross bladder abnormality. Stomach/Bowel: Stomach, small bowel and colon are unremarkable. The lower sigmoid colon is partly obscured by artifact from bilateral hip prosthesis. Appendix not visualized. Vascular/Lymphatic: No  significant vascular findings are present. No enlarged abdominal or pelvic lymph nodes. Reproductive: Uterus and adnexa are partly obscured by artifact from bilateral hip prosthesis. No pelvic masses. Other: No abdominal wall hernia or abnormality. No abdominopelvic ascites. Musculoskeletal: Well-positioned bilateral total hip arthroplasties. No acute fracture. No osteoblastic or osteolytic lesions. IMPRESSION: 1. No acute findings.  No findings to account for abdominal pain. 2. Stable liver cysts. 3. Stable changes from a  previous cholecystectomy. Electronically Signed   By: Lajean Manes M.D.   On: 10/09/2016 14:34     1215:  No hx TAA/AAA on CT scans 2017 and 2014.  1515:  Possible UTI on Udip; workup otherwise reassuring. Has tol PO well while in the ED without N/V. No stooling while in the ED. VSS.  Doubt ACS as cause for symptoms with normal troponin and unchanged EKG from previous after 2-3 days of constant symptoms. Tx symptomatically, f/u GI MD. Pt states she already has Cards MD appt scheduled for tomorrow. Dx and testing d/w pt.  Questions answered.  Verb understanding, agreeable to d/c home with outpt f/u.     Final Clinical Impressions(s) / ED Diagnoses   Final diagnoses:  None    New Prescriptions New Prescriptions   No medications on file     Francine Graven, DO 10/12/16 1941

## 2016-10-09 NOTE — ED Notes (Signed)
Pt refused Pepcid after spiking bag. MD made aware.

## 2016-10-10 ENCOUNTER — Ambulatory Visit (HOSPITAL_COMMUNITY)
Admission: RE | Admit: 2016-10-10 | Discharge: 2016-10-10 | Disposition: A | Discharge status: Home / Self Care | Payer: Medicare Other | Source: Ambulatory Visit | Attending: Cardiology | Admitting: Cardiology

## 2016-10-10 ENCOUNTER — Encounter (HOSPITAL_COMMUNITY): Payer: Self-pay

## 2016-10-10 VITALS — BP 117/71 | HR 76 | Wt 157.8 lb

## 2016-10-10 DIAGNOSIS — I10 Essential (primary) hypertension: Secondary | ICD-10-CM | POA: Diagnosis not present

## 2016-10-10 DIAGNOSIS — Z8619 Personal history of other infectious and parasitic diseases: Secondary | ICD-10-CM | POA: Diagnosis not present

## 2016-10-10 DIAGNOSIS — I4581 Long QT syndrome: Secondary | ICD-10-CM | POA: Diagnosis not present

## 2016-10-10 DIAGNOSIS — I471 Supraventricular tachycardia: Secondary | ICD-10-CM | POA: Diagnosis not present

## 2016-10-10 DIAGNOSIS — Z7982 Long term (current) use of aspirin: Secondary | ICD-10-CM | POA: Insufficient documentation

## 2016-10-10 DIAGNOSIS — R079 Chest pain, unspecified: Secondary | ICD-10-CM | POA: Insufficient documentation

## 2016-10-10 DIAGNOSIS — R55 Syncope and collapse: Secondary | ICD-10-CM | POA: Insufficient documentation

## 2016-10-10 DIAGNOSIS — I2510 Atherosclerotic heart disease of native coronary artery without angina pectoris: Secondary | ICD-10-CM | POA: Insufficient documentation

## 2016-10-10 DIAGNOSIS — Z8541 Personal history of malignant neoplasm of cervix uteri: Secondary | ICD-10-CM | POA: Diagnosis not present

## 2016-10-10 DIAGNOSIS — E119 Type 2 diabetes mellitus without complications: Secondary | ICD-10-CM | POA: Diagnosis not present

## 2016-10-10 DIAGNOSIS — I442 Atrioventricular block, complete: Secondary | ICD-10-CM | POA: Insufficient documentation

## 2016-10-10 DIAGNOSIS — I4589 Other specified conduction disorders: Secondary | ICD-10-CM

## 2016-10-10 DIAGNOSIS — K589 Irritable bowel syndrome without diarrhea: Secondary | ICD-10-CM | POA: Diagnosis not present

## 2016-10-10 DIAGNOSIS — G8929 Other chronic pain: Secondary | ICD-10-CM | POA: Diagnosis not present

## 2016-10-10 MED ORDER — METOPROLOL SUCCINATE ER 25 MG PO TB24: 12.5000 mg | ORAL_TABLET | Freq: Two times a day (BID) | ORAL | 6 refills | Status: DC

## 2016-10-11 LAB — URINE CULTURE
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
## 2016-10-12 ENCOUNTER — Telehealth: Payer: Self-pay | Admitting: Emergency Medicine

## 2016-10-12 NOTE — Telephone Encounter (Signed)
Post ED Visit - Positive Culture Follow-up  Culture report reviewed by antimicrobial stewardship pharmacist:  []  Elenor Quinones, Pharm.D. []  Heide Guile, Pharm.D., BCPS AQ-ID []  Parks Neptune, Pharm.D., BCPS [x]  Alycia Rossetti, Pharm.D., BCPS []  [place], Pharm.D., BCPS, AAHIVP []  Legrand Como, Pharm.D., BCPS, AAHIVP []  Salome Arnt, PharmD, BCPS []  Dimitri Ped, PharmD, BCPS []  Vincenza Hews, PharmD, BCPS  Positive urine culture Treated with cephalexin, asymptomatic, recommendation to stop Keflex, consider no antibitoics and revisit PCP if symptoms occur  Hazle Nordmann 10/12/2016, 1:26 PM

## 2016-10-12 NOTE — Progress Notes (Signed)
ED Antimicrobial Stewardship Positive Culture Follow Up   Renee [name] is an 71 y.o. female who presented to [facility] on 10/09/2016 with a chief complaint of  Chief Complaint  Patient presents with  . Chest Pain  . Abdominal Pain  . Back Pain  . Chills    Recent Results (from the past 720 hour(s))  Urine culture     Status: Abnormal   Collection Time: 10/09/16  1:47 PM  Result Value Ref Range Status   Specimen Description URINE, CLEAN CATCH  Final   Special Requests NONE  Final   Culture >=100,000 COLONIES/mL ENTEROCOCCUS FAECALIS (A)  Final   Report Status 10/11/2016 FINAL  Final   Organism ID, Bacteria ENTEROCOCCUS FAECALIS (A)  Final      Susceptibility   Enterococcus faecalis - MIC*    AMPICILLIN <=2 SENSITIVE Sensitive     LEVOFLOXACIN 1 SENSITIVE Sensitive     NITROFURANTOIN <=16 SENSITIVE Sensitive     VANCOMYCIN 1 SENSITIVE Sensitive     * >=100,000 COLONIES/mL ENTEROCOCCUS FAECALIS    [x]  Treated with Keflex, organism resistant to prescribed antimicrobial []  Patient discharged originally without antimicrobial agent and treatment is now indicated  New antibiotic prescription: D/c Keflex, no additional treatment needed  ED Provider: Okey Regal, PA-C   Lawson Radar 10/12/2016, 10:49 AM Infectious Diseases Pharmacist Phone# [phone]

## 2016-10-14 ENCOUNTER — Telehealth: Payer: Self-pay | Admitting: Gastroenterology

## 2016-10-14 NOTE — Telephone Encounter (Signed)
Left message for patient to call back  

## 2016-10-17 NOTE — Telephone Encounter (Signed)
Patient was having constipation for a few weeks and had problems with her internal hemorrhoids.  She had to manually  disimpact herself to get relief. She had some bleeding.  All this has resolved.  She also feels that "my rectum wants to come out".  She would like to go see Dr. Marcello Moores again at [facility].  She is given the number, currently not an issue only noticed with severe constipation.  She is advised that she should try to prevent constipation by using Miralax or Colace daily.  She will call back for any additional questions or concerns.

## 2016-10-18 ENCOUNTER — Telehealth: Payer: Self-pay | Admitting: Internal Medicine

## 2016-10-18 NOTE — Telephone Encounter (Signed)
New message     Pt states she was having problems with pacemaker and she states the device clinic told her they could not read her device again until tonight and , that will not help with with the problem she is having with it now. Does she need to go to the hospital ?

## 2016-10-18 NOTE — Telephone Encounter (Signed)
Spoke with patient who states that she felt like she was in SVT this morning with a symptom of " all the air being squeezed out of my heart". She states that she currently feels fine and is free of any symptoms related to her irregular heart rates. She was very concerned that we could not continuously monitor her heart rate. We decided that since she is symptom free we will review her transmission tomorrow to determine if she had any episodes recorded during her symptom episode. She was agreeable to this plan.

## 2016-10-24 ENCOUNTER — Telehealth: Payer: Self-pay | Admitting: Internal Medicine

## 2016-10-24 NOTE — Telephone Encounter (Signed)
Called patient who complains of episodes of SVT. She is taking all meds as prescribed.  She is currently on Flecainide 25 mg BID, Diltiazem CD 120 mg QD, and Metoprolol XL 25 mg QD. She has been checking her HR and BP daily since 5/4. She gave me the recordings since 10/21/2016. Pt describes her episodes of "erratic beats as irregular beats"  10/21/2016: BP 126/84, HR 73. Pt c/o erratic beats (irregular) beats. 10/22/2016: BP  Am: 119/78, HR 65  10 pm: BP 155/133. HR 79   11 pm: BP 121/81, HR 69. c/o erratic beats    10/23/16: BP 5:40 am: BP 125/92  HR 83, c/o erratic beats 10/24/16: BP 6:10 am: BP 119/76, HR 72     9:10 am: BP 117/69, HR 94, c/o erratic beats  Pt describes her episodes as someone "taking their hand and squeezing all the air out of her heart". During her episodes, she experiences dizziness, shortness of breath and feels like she is about to fall over and collapse. She stated someone told her that if her HR is not >160, then SVT will not show up on her monitor. She told me she spoke to someone this morning about her device. Advised pt to keep her appt with Cristopher Peru on 12/09/2016 at 12:15 pm. Also asked patient to send in a manual transcript of her pacemaker  so someone could evaluate her heart rhythms. She stated she is not at home right now but when she gets home she could do so. Advised pt to take it easy, not to over exert herself, to drink plenty of fluid, to keep checking her HR and BP daily, and to go to ER if she feels like she is going to pass out. She states right now she feels okay. She is just highly concerned about her episodes of SVT that she is experiencing. I told pt I would send Dr. Lovena Le and his nurse the message. Also advised patient I would send message to [facility]. Pt verbalized understanding and thanked me for my call. Instructed to call the office back with any worsening symptoms.

## 2016-10-24 NOTE — Telephone Encounter (Signed)
Patient calling, states that she is still having episodes. Patient believes that it is SVT's and states that she is having more episodes. Thanks.

## 2016-10-24 NOTE — Telephone Encounter (Signed)
Spoke with pt earlier this morning, informed pt that her home monitor did not show any episodes at all, the last episode pt had was on 09/30/2016. Informed pt that her home monitor is up to date but would only transmit if her HR went above 160 bpm that is the parameter setting, so if she is having erratic heart beats at 130-140bpm it will not meet parameters and will not transmit.

## 2016-10-27 ENCOUNTER — Telehealth: Payer: Self-pay | Admitting: Internal Medicine

## 2016-10-27 NOTE — Telephone Encounter (Signed)
Called, spoke with pt. Pt requested sooner appt. Informed pt next available appt is on 11/09/16 at 3:30 PM, arriving at 3:15 PM. Pt stated she would like to be rescheduled to this appt date/time. Pt stated she feels she is having episodes of erratic heart rate. Informed if symptoms get worse, or pt develops symptoms prior to appt on 11/09/16, to please call our office. Pt would like to discuss with Dr. Lovena Le about switching from beta blocker to a different med, due to it making her feel weak. Pt verbalized understanding and thanked me for calling.

## 2016-10-27 NOTE — Telephone Encounter (Signed)
Patient is demanding to see Dr. Lovena Le sooner than her scheduled appointment in June. Will forward to Dr. Tanna Furry nurse.

## 2016-10-27 NOTE — Telephone Encounter (Signed)
New message       Pt c/o medication issue:  1. Name of Medication: metoprolol  2. How are you currently taking this medication (dosage and times per day)? 25mg  3. Are you having a reaction (difficulty breathing--STAT)?  Not sure  4. What is your medication issue?  Pt states that she cannot take this medication.  It is causing her to be sob and have joint pain in her knees.  Please call

## 2016-10-27 NOTE — Telephone Encounter (Signed)
Follow Up:      Returning your call from today. 

## 2016-10-27 NOTE — Telephone Encounter (Signed)
Left message for patient to call back  

## 2016-10-29 NOTE — Telephone Encounter (Signed)
Continue beta blocker until our clinic visit. GT

## 2016-10-31 NOTE — Telephone Encounter (Signed)
Called, spoke with pt. Informed Dr. Lovena Le recommends pt continuing beta blocker until clinic visit. Pt verbalized understanding.

## 2016-11-08 ENCOUNTER — Encounter: Payer: Self-pay | Admitting: Internal Medicine

## 2016-11-09 ENCOUNTER — Encounter: Payer: Self-pay | Admitting: Internal Medicine

## 2016-11-09 ENCOUNTER — Ambulatory Visit (INDEPENDENT_AMBULATORY_CARE_PROVIDER_SITE_OTHER): Payer: Medicare Other | Admitting: Internal Medicine

## 2016-11-09 VITALS — BP 124/74 | HR 75 | Ht 63.0 in | Wt 162.0 lb

## 2016-11-09 DIAGNOSIS — Z950 Presence of cardiac pacemaker: Secondary | ICD-10-CM | POA: Diagnosis not present

## 2016-11-09 DIAGNOSIS — I4589 Other specified conduction disorders: Secondary | ICD-10-CM | POA: Diagnosis not present

## 2016-11-09 LAB — CUP PACEART INCLINIC DEVICE CHECK
Brady Statistic RV Percent Paced: 1 %
Date Time Interrogation Session: 20180530162947
Implantable Lead Implant Date: 20170921
Implantable Lead Location: 753860
Implantable Lead Model: 377
Implantable Lead Model: 377
Implantable Lead Serial Number: 49580836
Lead Channel Impedance Value: 546 Ohm
Lead Channel Impedance Value: 682 Ohm
Lead Channel Pacing Threshold Amplitude: 0.5 V
Lead Channel Pacing Threshold Pulse Width: 0.4 ms
Lead Channel Sensing Intrinsic Amplitude: 3.7 mV
Lead Channel Setting Pacing Amplitude: 1.6 V
MDC IDC LEAD IMPLANT DT: 20170921
MDC IDC LEAD LOCATION: 753859
MDC IDC LEAD SERIAL: 49575519
MDC IDC MSMT LEADCHNL RA PACING THRESHOLD PULSEWIDTH: 0.4 ms
MDC IDC MSMT LEADCHNL RV PACING THRESHOLD AMPLITUDE: 0.7 V
MDC IDC MSMT LEADCHNL RV SENSING INTR AMPL: 12.3 mV
MDC IDC PG IMPLANT DT: 20170921
MDC IDC PG SERIAL: 68850891
MDC IDC SET LEADCHNL RV PACING AMPLITUDE: 1.3 V
MDC IDC SET LEADCHNL RV PACING PULSEWIDTH: 0.4 ms
MDC IDC STAT BRADY RA PERCENT PACED: 67 %

## 2016-11-09 MED ORDER — FLECAINIDE ACETATE 50 MG PO TABS: 50.0000 mg | ORAL_TABLET | Freq: Two times a day (BID) | ORAL | 2 refills | Status: DC
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
## 2016-11-09 NOTE — Patient Instructions (Addendum)
Medication Instructions:  Your physician has recommended you make the following change in your medication:  STOP Toprol XL  INCREASE Flecainide 50 mg twice daily  Labwork: None Ordered   Testing/Procedures: None Ordered   Follow-Up: Your physician wants you to follow-up in: 6 months with Dr. Lovena Le. You will receive a reminder letter in the mail two months in advance. If you don't receive a letter, please call our office to schedule the follow-up appointment.  Remote monitoring is used to monitor your Pacemaker from home. This monitoring reduces the number of office visits required to check your device to one time per year. It allows [place] to keep an eye on the functioning of your device to ensure it is working properly. You are scheduled for a device check from home on  02/08/17 . You may send your transmission at any time that day. If you have a wireless device, the transmission will be sent automatically. After your physician reviews your transmission, you will receive a postcard with your next transmission date.    Any Other Special Instructions Will Be Listed Below (If Applicable).     If you need a refill on your cardiac medications before your next appointment, please call your pharmacy.

## 2016-11-10 ENCOUNTER — Other Ambulatory Visit: Payer: Self-pay | Admitting: Internal Medicine

## 2016-11-10 ENCOUNTER — Other Ambulatory Visit (INDEPENDENT_AMBULATORY_CARE_PROVIDER_SITE_OTHER): Payer: Medicare Other

## 2016-11-10 ENCOUNTER — Telehealth: Payer: Self-pay | Admitting: Internal Medicine

## 2016-11-10 DIAGNOSIS — N390 Urinary tract infection, site not specified: Secondary | ICD-10-CM | POA: Diagnosis not present

## 2016-11-10 LAB — URINALYSIS, ROUTINE W REFLEX MICROSCOPIC
BILIRUBIN URINE: NEGATIVE
KETONES UR: NEGATIVE
NITRITE: POSITIVE — AB
RBC / HPF: NONE SEEN (ref 0–?)
Specific Gravity, Urine: 1.015 (ref 1.000–1.030)
Total Protein, Urine: NEGATIVE
URINE GLUCOSE: NEGATIVE
Urobilinogen, UA: 0.2 (ref 0.0–1.0)
pH: 6 (ref 5.0–8.0)

## 2016-11-10 MED ORDER — CEPHALEXIN 500 MG PO CAPS: 500.0000 mg | ORAL_CAPSULE | Freq: Four times a day (QID) | ORAL | 0 refills | Status: DC

## 2016-11-10 NOTE — Telephone Encounter (Signed)
Pt was seen recently in the ED for a UTI. She thinks that she still has the UTI and was wanting to know if she could bring a urine specimen to have checked. She is in [place] today and wanted to try to come today. Can this be done?

## 2016-11-10 NOTE — Telephone Encounter (Signed)
Ok, will wait for results

## 2016-11-10 NOTE — Telephone Encounter (Signed)
Pt states that she has been having symptoms since Sunday of pressure, urgency but little to no urination, no burning or pain with urination. She is very adamant that she has a UTI. I informed her that I would put it an order for a UA and a culture. I told her that the UA results will more than likely be back tomorrow but the culture would take a few days because it has to be sent off. She understood. I wanted you to be aware of what happened in case those results come back. If she needs an appt let me know and I will call her back to schedule.

## 2016-11-11 ENCOUNTER — Telehealth: Payer: Self-pay | Admitting: Internal Medicine

## 2016-11-11 ENCOUNTER — Telehealth: Payer: Self-pay

## 2016-11-11 MED ORDER — CEPHALEXIN 500 MG PO CAPS: 500.0000 mg | ORAL_CAPSULE | Freq: Four times a day (QID) | ORAL | 0 refills | Status: DC

## 2016-11-11 NOTE — Telephone Encounter (Signed)
Pt would like a call back with her lab results.

## 2016-11-11 NOTE — Telephone Encounter (Signed)
Pt does not want upfront office staff to give her any lab results.

## 2016-11-11 NOTE — Telephone Encounter (Signed)
Called pt, LVM.   

## 2016-11-11 NOTE — Telephone Encounter (Signed)
Unfortunately we usually do not know why UTI reoccur, and having 1-2 per yr is considered and common and almost normal per urology  I am sure it is not anything she is doing, any of her medication.  If the infections continue to recur, we would need to consider urology referral

## 2016-11-11 NOTE — Telephone Encounter (Signed)
Pt was given her results and expressed understanding. She stated that she would like to know what could have possibly happened for her to get another UTI after taking 2000mg  of keflex daily the other month for a UTI then. I told her that I would ask Dr. Jenny Reichmann to see if he has any explanation. She also voiced that she was upset that the front office staff tried to give her a "diagnosis" instead of transferring her to the back and stated that she has spoke with the office manager about it. Please advise.

## 2016-11-11 NOTE — Telephone Encounter (Signed)
Pt has been informed and expressed understanding. I told her that I would be in touch as soon as the culture results are back.

## 2016-11-11 NOTE — Telephone Encounter (Signed)
-----   Message from Biagio Borg, MD sent at 11/10/2016  6:20 PM EDT ----- Left message on MyChart, pt to cont same tx except  The test results show that your current treatment is OK, except the urine is consistent with infection, as you suspected.  We will send an antibiotic to your pharmacy, and you should be notified of this as well.Redmond Baseman to please inform pt, I will do rx

## 2016-11-12 LAB — URINE CULTURE

## 2016-11-13 MED ORDER — SULFAMETHOXAZOLE-TRIMETHOPRIM 800-160 MG PO TABS: 1.0000 | ORAL_TABLET | Freq: Two times a day (BID) | ORAL | 0 refills | Status: DC

## 2016-12-06 DIAGNOSIS — H524 Presbyopia: Secondary | ICD-10-CM | POA: Diagnosis not present

## 2016-12-09 ENCOUNTER — Encounter: Payer: Medicare Other | Admitting: Internal Medicine

## 2017-01-04 ENCOUNTER — Telehealth (HOSPITAL_COMMUNITY): Payer: Self-pay

## 2017-01-04 NOTE — Telephone Encounter (Signed)
Patient requesting to have labs drawn to check potassium level due to cramps. Left return VM per Dr. Aundra Dubin ok to have BMET checked. Advised to return call to our office to schedule a lab apt for BMET under Dr. Aundra Dubin VO.  Renee Pain, RN

## 2017-01-05 ENCOUNTER — Ambulatory Visit (HOSPITAL_COMMUNITY)
Admission: RE | Admit: 2017-01-05 | Discharge: 2017-01-05 | Disposition: A | Discharge status: Home / Self Care | Payer: Medicare Other | Source: Ambulatory Visit | Attending: Cardiology | Admitting: Cardiology

## 2017-01-05 DIAGNOSIS — R079 Chest pain, unspecified: Secondary | ICD-10-CM

## 2017-01-05 LAB — BASIC METABOLIC PANEL
Anion gap: 10 (ref 5–15)
BUN: 11 mg/dL (ref 6–20)
CO2: 26 mmol/L (ref 22–32)
CREATININE: 0.79 mg/dL (ref 0.44–1.00)
Calcium: 9.9 mg/dL (ref 8.9–10.3)
Chloride: 102 mmol/L (ref 101–111)
Glucose, Bld: 157 mg/dL — ABNORMAL HIGH (ref 65–99)
POTASSIUM: 4 mmol/L (ref 3.5–5.1)
SODIUM: 138 mmol/L (ref 135–145)

## 2017-01-16 DIAGNOSIS — Z85828 Personal history of other malignant neoplasm of skin: Secondary | ICD-10-CM | POA: Diagnosis not present

## 2017-01-16 DIAGNOSIS — C44519 Basal cell carcinoma of skin of other part of trunk: Secondary | ICD-10-CM | POA: Diagnosis not present

## 2017-01-16 DIAGNOSIS — L820 Inflamed seborrheic keratosis: Secondary | ICD-10-CM | POA: Diagnosis not present

## 2017-01-16 DIAGNOSIS — C4441 Basal cell carcinoma of skin of scalp and neck: Secondary | ICD-10-CM | POA: Diagnosis not present

## 2017-01-16 DIAGNOSIS — L237 Allergic contact dermatitis due to plants, except food: Secondary | ICD-10-CM | POA: Diagnosis not present

## 2017-01-16 DIAGNOSIS — D225 Melanocytic nevi of trunk: Secondary | ICD-10-CM | POA: Diagnosis not present

## 2017-01-16 DIAGNOSIS — L814 Other melanin hyperpigmentation: Secondary | ICD-10-CM | POA: Diagnosis not present

## 2017-01-17 DIAGNOSIS — N8111 Cystocele, midline: Secondary | ICD-10-CM | POA: Diagnosis not present

## 2017-01-19 ENCOUNTER — Other Ambulatory Visit: Payer: Self-pay | Admitting: Internal Medicine

## 2017-01-19 DIAGNOSIS — Z1231 Encounter for screening mammogram for malignant neoplasm of breast: Secondary | ICD-10-CM

## 2017-01-20 ENCOUNTER — Ambulatory Visit
Admission: RE | Admit: 2017-01-20 | Discharge: 2017-01-20 | Disposition: A | Discharge status: Home / Self Care | Payer: Medicare Other | Source: Ambulatory Visit | Attending: Internal Medicine | Admitting: Internal Medicine

## 2017-01-20 DIAGNOSIS — Z1231 Encounter for screening mammogram for malignant neoplasm of breast: Secondary | ICD-10-CM | POA: Diagnosis not present

## 2017-01-24 DIAGNOSIS — N8111 Cystocele, midline: Secondary | ICD-10-CM | POA: Diagnosis not present

## 2017-01-26 ENCOUNTER — Telehealth: Payer: Self-pay | Admitting: Internal Medicine

## 2017-01-26 ENCOUNTER — Other Ambulatory Visit: Payer: Self-pay | Admitting: Internal Medicine

## 2017-01-26 DIAGNOSIS — Z0000 Encounter for general adult medical examination without abnormal findings: Secondary | ICD-10-CM

## 2017-01-26 NOTE — Telephone Encounter (Signed)
Dr. Jenny Reichmann please advise. Does pt need an appt? Her last CPE was a year ago even though she has been seen for a URI and a cough since her CPE.
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
## 2017-01-27 MED ORDER — ZOLPIDEM TARTRATE 5 MG PO TABS: 2.5000 mg | ORAL_TABLET | Freq: Every evening | ORAL | 1 refills | Status: DC | PRN

## 2017-01-27 NOTE — Telephone Encounter (Signed)
Ok for 1 mo and 1 refill ambien, but please ask pt to make ROV for wellness with labs done just prior

## 2017-01-27 NOTE — Telephone Encounter (Signed)
Pt has been informed and expressed understanding. She knows to keep the 8/21 appt to receive additional refills.

## 2017-01-31 ENCOUNTER — Other Ambulatory Visit: Payer: Medicare Other

## 2017-01-31 ENCOUNTER — Encounter: Payer: Self-pay | Admitting: Internal Medicine

## 2017-01-31 ENCOUNTER — Other Ambulatory Visit (INDEPENDENT_AMBULATORY_CARE_PROVIDER_SITE_OTHER): Payer: Medicare Other

## 2017-01-31 ENCOUNTER — Ambulatory Visit (INDEPENDENT_AMBULATORY_CARE_PROVIDER_SITE_OTHER): Payer: Medicare Other | Admitting: Internal Medicine

## 2017-01-31 VITALS — BP 116/84 | HR 94 | Temp 98.4°F | Ht 63.0 in | Wt 166.0 lb

## 2017-01-31 DIAGNOSIS — Z0000 Encounter for general adult medical examination without abnormal findings: Secondary | ICD-10-CM

## 2017-01-31 DIAGNOSIS — C4441 Basal cell carcinoma of skin of scalp and neck: Secondary | ICD-10-CM | POA: Diagnosis not present

## 2017-01-31 DIAGNOSIS — R739 Hyperglycemia, unspecified: Secondary | ICD-10-CM

## 2017-01-31 DIAGNOSIS — M79672 Pain in left foot: Secondary | ICD-10-CM | POA: Diagnosis not present

## 2017-01-31 DIAGNOSIS — M79671 Pain in right foot: Secondary | ICD-10-CM | POA: Diagnosis not present

## 2017-01-31 DIAGNOSIS — Z1159 Encounter for screening for other viral diseases: Secondary | ICD-10-CM

## 2017-01-31 LAB — URINALYSIS, ROUTINE W REFLEX MICROSCOPIC
BILIRUBIN URINE: NEGATIVE
KETONES UR: NEGATIVE
NITRITE: NEGATIVE
PH: 6 (ref 5.0–8.0)
SPECIFIC GRAVITY, URINE: 1.02 (ref 1.000–1.030)
TOTAL PROTEIN, URINE-UPE24: NEGATIVE
URINE GLUCOSE: NEGATIVE
Urobilinogen, UA: 0.2 (ref 0.0–1.0)

## 2017-01-31 LAB — CBC WITH DIFFERENTIAL/PLATELET
BASOS ABS: 0.1 10*3/uL (ref 0.0–0.1)
Basophils Relative: 1 % (ref 0.0–3.0)
EOS PCT: 1.7 % (ref 0.0–5.0)
Eosinophils Absolute: 0.1 10*3/uL (ref 0.0–0.7)
HCT: 41.8 % (ref 36.0–46.0)
HEMOGLOBIN: 13.7 g/dL (ref 12.0–15.0)
LYMPHS ABS: 2 10*3/uL (ref 0.7–4.0)
Lymphocytes Relative: 31.8 % (ref 12.0–46.0)
MCHC: 32.8 g/dL (ref 30.0–36.0)
MCV: 83.4 fl (ref 78.0–100.0)
MONO ABS: 0.4 10*3/uL (ref 0.1–1.0)
MONOS PCT: 5.6 % (ref 3.0–12.0)
NEUTROS PCT: 59.9 % (ref 43.0–77.0)
Neutro Abs: 3.8 10*3/uL (ref 1.4–7.7)
Platelets: 251 10*3/uL (ref 150.0–400.0)
RBC: 5.01 Mil/uL (ref 3.87–5.11)
RDW: 13.1 % (ref 11.5–15.5)
WBC: 6.3 10*3/uL (ref 4.0–10.5)

## 2017-01-31 LAB — BASIC METABOLIC PANEL
BUN: 13 mg/dL (ref 6–23)
CALCIUM: 10 mg/dL (ref 8.4–10.5)
CHLORIDE: 103 meq/L (ref 96–112)
CO2: 30 meq/L (ref 19–32)
Creatinine, Ser: 0.88 mg/dL (ref 0.40–1.20)
GFR: 67.24 mL/min (ref >60.00)
Glucose, Bld: 93 mg/dL (ref 70–99)
POTASSIUM: 3.4 meq/L — AB (ref 3.5–5.1)
SODIUM: 138 meq/L (ref 135–145)

## 2017-01-31 LAB — HEPATIC FUNCTION PANEL
ALK PHOS: 93 U/L (ref 39–117)
ALT: 14 U/L (ref 0–35)
AST: 17 U/L (ref 0–37)
Albumin: 4.2 g/dL (ref 3.5–5.2)
BILIRUBIN DIRECT: 0.2 mg/dL (ref 0.0–0.3)
TOTAL PROTEIN: 7.4 g/dL (ref 6.0–8.3)
Total Bilirubin: 0.8 mg/dL (ref 0.2–1.2)

## 2017-01-31 LAB — LIPID PANEL
CHOLESTEROL: 146 mg/dL (ref 0–200)
HDL: 61.6 mg/dL (ref >39.00)
LDL Cholesterol: 50 mg/dL (ref 0–99)
NONHDL: 84.8
Total CHOL/HDL Ratio: 2
Triglycerides: 174 mg/dL — ABNORMAL HIGH (ref 0.0–149.0)
VLDL: 34.8 mg/dL (ref 0.0–40.0)

## 2017-01-31 LAB — TSH: TSH: 0.85 u[IU]/mL (ref 0.35–4.50)

## 2017-01-31 LAB — HEMOGLOBIN A1C: HEMOGLOBIN A1C: 5.7 % (ref 4.6–6.5)

## 2017-01-31 NOTE — Assessment & Plan Note (Signed)

## 2017-01-31 NOTE — Assessment & Plan Note (Signed)
Lab Results  Component Value Date   HGBA1C 5.7 01/31/2017  stable overall by history and exam, recent data reviewed with pt, and pt to continue medical treatment as before,  to f/u any worsening symptoms or concerns

## 2017-01-31 NOTE — Assessment & Plan Note (Signed)
Ok for podiatry referral 

## 2017-01-31 NOTE — Patient Instructions (Signed)

## 2017-01-31 NOTE — Progress Notes (Signed)
Subjective:    Patient ID: Renee [name], female    DOB: [birth year], 71 y.o.   MRN: [id number]  HPI  Here for wellness and f/u;  Overall doing ok;  Pt denies Chest pain, worsening SOB, DOE, wheezing, orthopnea, PND, worsening LE edema, palpitations, dizziness or syncope.  Pt denies neurological change such as new headache, facial or extremity weakness.  Pt denies polydipsia, polyuria, or low sugar symptoms. Pt states overall good compliance with treatment and medications, good tolerability, and has been trying to follow appropriate diet.  Pt denies worsening depressive symptoms, suicidal ideation or panic. No fever, night sweats, wt loss, loss of appetite, or other constitutional symptoms.  Pt states good ability with ADL's, has low fall risk, home safety reviewed and adequate, no other significant changes in hearing or vision, and only occasionally active with exercise.  Having balance  Issues, she thinks maybe due to right plantar heel pain x 6 wks.  Declined podiatry referral, then changed her mind.  Due for TAH with bladder tack soon per GYN and urology.  S/p basal cell ca earlier this am No other new hx Past Medical History:  Diagnosis Date  . Anxiety   . AV nodal re-entry tachycardia [facility])    a. s/p RFA 1993- Dr. Rolland Porter, [place]  . Broken neck ([place])    1993  . Cancer (HCC)    hx of cervical   ca  . Chronic chest pain    a. 2005 Cath: Mild nonobstructive plaque (30-40% LCX);  b. 08/2011 low risk myoview;  c. 09/2011 Coronary CT: NL cors w ca score of 0.  . Chronic pain syndrome   . Complication of anesthesia    blood pressure and heart dropped in 2009    . Concussion    1979, 1993  . Degenerative joint disease    a. s/p L Total Knee Arthroplasty.  . Diet-controlled type 2 diabetes mellitus ([place])   . Diverticulosis of colon   . Duodenitis without hemorrhage   . Fibromyalgia   . GERD (gastroesophageal reflux disease)   . Hemorrhoids   . Hypertension   . Irritable bowel syndrome   .  Long QT interval    a. mild - advised to avoid meds that may prolong QT.  Marland Kitchen Neuropathy   . Osteoporosis   . SVT (supraventricular tachycardia) (HCC)    a. nonsustained SVT - previously offered flecainide but refused;  b. 12/2012 30 day event monitor w/o significant arrhythmias.  . Thyroid nodule    Past Surgical History:  Procedure Laterality Date  . ABLATION OF DYSRHYTHMIC FOCUS  1993  . BIOPSY THYROID    . CARDIAC ELECTROPHYSIOLOGY STUDY AND ABLATION     atrioventricular nodal reentant tachycardia  . CHOLECYSTECTOMY  1980  . COLONOSCOPY  06/25/2008   Diverticulosis and Hemorrhoids  . COLPOSCOPY    . CORONARY ANGIOPLASTY  2007  . EP IMPLANTABLE DEVICE N/A 03/03/2016   Procedure: Pacemaker Implant;  Surgeon: Evans Lance, MD;  Location: [place] [facility];  Service: Cardiovascular;  Laterality: N/A;  . ESOPHAGOGASTRODUODENOSCOPY    . HYSTEROSCOPY  2002   with resection of endometrial polyps. by Dr.MCPhail  . HYSTEROSCOPY W/D&C N/A 02/05/2015   Procedure: DILATATION AND CURETTAGE /HYSTEROSCOPY;  Surgeon: Vanessa Kick, MD;  Location: [place] [facility];  Service: Gynecology;  Laterality: N/A;  . LOOP RECORDER IMPLANT  08/21/2013   MDT LinQ implanted by Dr Lovena Le for syncope  . LOOP RECORDER IMPLANT N/A 08/21/2013   Procedure: LOOP RECORDER  IMPLANT;  Surgeon: Evans Lance, MD;  [facility] C[facility];  Service: Cardiovascular;  Laterality: N/A;  . ORIF RADIAL HEAD / NECK FRACTURE  09/05/88  . TILT TABLE STUDY N/A 05/15/2013   Procedure: TILT TABLE STUDY;  Surgeon: Deboraha Sprang, MD;  Location: [facility] CATH LAB;  Service: Cardiovascular;  Laterality: N/A;  . TOTAL HIP ARTHROPLASTY  10/10   Left, at [facility]    reports that she has never smoked. She has never used smokeless tobacco. She reports that she drinks alcohol. She reports that she does not use drugs. family history includes Asthma in her mother and sister; Diabetes in her brother, mother, and sister; Heart disease in her sister and sister;  Lung cancer in her father; Ovarian cancer in her mother; Stroke in her mother. Allergies  Allergen Reactions  . Cyclobenzaprine Other (See Comments)    Pt states that this medication does not work for her.    . Azithromycin Other (See Comments)    Cannot take due to prolonged QT  . Adhesive [Tape] Itching, Rash and Other (See Comments)    Reaction:  Blisters  Pt states that she is allergic to the adhesive backing of EKG pads if left on for more than 24 hours   . Moxifloxacin Other (See Comments)    Unknown allergic reaction many years ago  . Quinolones Other (See Comments)    Unknown allergic reaction many years ago   Current Outpatient Prescriptions on File Prior to Visit  Medication Sig Dispense Refill  . ALPRAZolam (XANAX) 0.5 MG tablet Take 0.5-1 tablets (0.25-0.5 mg total) by mouth 2 (two) times daily as needed for anxiety. (Patient taking differently: Take 0.25 mg by mouth daily as needed for anxiety. ) 90 tablet 1  . aspirin EC 81 MG tablet Take 81 mg by mouth daily.     Marland Kitchen diltiazem (CARDIZEM CD) 120 MG 24 hr capsule Take 1 capsule (120 mg total) by mouth every morning. 90 capsule 3  . flecainide (TAMBOCOR) 50 MG tablet Take 1 tablet (50 mg total) by mouth 2 (two) times daily. 180 tablet 2  . glucose blood (ACCU-CHEK SMARTVIEW) test strip 1 each by Other route 2 (two) times daily. Use to check blood sugars twice a day Dx e11.9 300 each 2  . Lancets (ACCU-CHEK MULTICLIX) lancets 1 each by Other route 2 (two) times daily. Use to help check blood sugars twice a day Dx e11.9 1 each 0  . naproxen sodium (ALEVE) 220 MG tablet Take 440 mg by mouth 2 (two) times daily as needed (arthritis pain).    . nitroGLYCERIN (NITROSTAT) 0.4 MG SL tablet Place 1 tablet (0.4 mg total) under the tongue every 5 (five) minutes as needed for chest pain. 30 tablet 0  . omeprazole (PRILOSEC) 40 MG capsule Take 1 capsule (40 mg total) by mouth every evening. 90 capsule 2  . potassium chloride (MICRO-K) 10  MEQ CR capsule Take 1 capsule (10 mEq total) by mouth daily. Yearly physical due in March must see MD for refills 90 capsule 0  . triamterene-hydrochlorothiazide (DYAZIDE) 37.5-25 MG capsule Take 1 each (1 capsule total) by mouth daily. 90 capsule 3  . zolpidem (AMBIEN) 5 MG tablet Take 0.5-1 tablets (2.5-5 mg total) by mouth at bedtime as needed for sleep. 30 tablet 1   No current facility-administered medications on file prior to visit.    Review of Systems VS noted,  Constitutional: Pt is oriented to person, place, and time. Appears well-developed  and well-nourished, in no significant distress and comfortable Head: Normocephalic and atraumatic  Eyes: Conjunctivae and EOM are normal. Pupils are equal, round, and reactive to light Right Ear: External ear normal without discharge Left Ear: External ear normal without discharge Nose: Nose without discharge or deformity Mouth/Throat: Oropharynx is without other ulcerations and moist  Neck: Normal range of motion. Neck supple. No JVD present. No tracheal deviation present or significant neck LA or mass Cardiovascular: Normal rate, regular rhythm, normal heart sounds and intact distal pulses.   Pulmonary/Chest: WOB normal and breath sounds without rales or wheezing  Abdominal: Soft. Bowel sounds are normal. NT. No HSM  Musculoskeletal: Normal range of motion. Exhibits no edema Lymphadenopathy: Has no other cervical adenopathy.  Neurological: Pt is alert and oriented to person, place, and time. Pt has normal reflexes. No cranial nerve deficit. Motor grossly intact, Gait intact Skin: Skin is warm and dry. No rash noted or new ulcerations Psychiatric:  Has normal mood and affect. Behavior is normal without agitation All other system neg per pt    Objective:   Physical Exam BP 116/84   Pulse 94   Temp 98.4 F (36.9 C)   Ht 5\' 3"  (1.6 m)   Wt 166 lb (75.3 kg)   SpO2 99%   BMI 29.41 kg/m  VS noted,  Constitutional: Pt is oriented to  person, place, and time. Appears well-developed and well-nourished, in no significant distress and comfortable Head: Normocephalic and atraumatic  Eyes: Conjunctivae and EOM are normal. Pupils are equal, round, and reactive to light Right Ear: External ear normal without discharge Left Ear: External ear normal without discharge Nose: Nose without discharge or deformity Mouth/Throat: Oropharynx is without other ulcerations and moist  Neck: Normal range of motion. Neck supple. No JVD present. No tracheal deviation present or significant neck LA or mass Cardiovascular: Normal rate, regular rhythm, normal heart sounds and intact distal pulses.   Pulmonary/Chest: WOB normal and breath sounds without rales or wheezing  Abdominal: Soft. Bowel sounds are normal. NT. No HSM  Musculoskeletal: Normal range of motion. Exhibits no edema Lymphadenopathy: Has no other cervical adenopathy.  Neurological: Pt is alert and oriented to person, place, and time. Pt has normal reflexes. No cranial nerve deficit. Motor grossly intact, Gait intact Skin: Skin is warm and dry. No rash noted or new ulcerations Psychiatric:  Has marked nervous talkative mood and affect. Behavior is normal without agitation No other exam findings Lab Results  Component Value Date   WBC 6.3 01/31/2017   HGB 13.7 01/31/2017   HCT 41.8 01/31/2017   PLT 251.0 01/31/2017   GLUCOSE 93 01/31/2017   CHOL 146 01/31/2017   TRIG 174.0 (H) 01/31/2017   HDL 61.60 01/31/2017   LDLCALC 50 01/31/2017   ALT 14 01/31/2017   AST 17 01/31/2017   NA 138 01/31/2017   K 3.4 (L) 01/31/2017   CL 103 01/31/2017   CREATININE 0.88 01/31/2017   BUN 13 01/31/2017   CO2 30 01/31/2017   TSH 0.85 01/31/2017   INR 1.01 10/07/2016   HGBA1C 5.7 01/31/2017       Assessment & Plan:

## 2017-02-01 LAB — HEPATITIS C ANTIBODY: HCV Ab: NONREACTIVE

## 2017-02-08 ENCOUNTER — Telehealth: Payer: Self-pay | Admitting: *Deleted

## 2017-02-08 ENCOUNTER — Ambulatory Visit (INDEPENDENT_AMBULATORY_CARE_PROVIDER_SITE_OTHER): Payer: Medicare Other | Admitting: *Deleted

## 2017-02-08 DIAGNOSIS — R55 Syncope and collapse: Secondary | ICD-10-CM

## 2017-02-08 NOTE — Telephone Encounter (Signed)
Initially patient called to see how she could send in a manual transmission as it's scheduled for today. I informed patient that her transmissions are automatic and that it had already been received. Patient then asked if she had any PMT episodes on Sunday and Monday bc she had sx's that were similar to what she has when she's in PMT. I told patient that no episodes were recorded during the times she mentioned on Sunday or Monday. Patient then asked if she had any episodes at all. I told patient about AF episode from Monday and [facility] episode from 8/24. Patient then decided to explain to me that Dr.Taylor told her that she may need an AF ablation in the future if her AF becomes more persistent. Patient then asked when her appts were with Dr.McLean and Dr.Taylor. Informed patient that she has recalls for October and November and she will be contacted about making the appointments as it gets closer to time. Patient then gave me a brief rundown of her cardiac hx over the last 35-40 years. She then asked how often we received transmissions from her monitor. I told her that for BTK it sends daily. Then she asked about the episode she had from 01/02/16 when she had an ILR. She states that she had an episode on that Friday, but was not contacted until Monday. She states that she has brought this up to her MDs and no one has been able to give her a straight answer. She states that she neglected to ask the question when it first occurred, but wants an answer at some point. I explained to patient that I could not give her an exact answer since the episode occurred so long ago (13 months), but I apologized for it happening and reiterated to her that Dr.Taylor would call her if he had any recommendations for her transmission that was received today. Patient verbalized understanding and appreciation of information.

## 2017-02-09 NOTE — Progress Notes (Signed)
Remote pacemaker transmission.   

## 2017-02-15 ENCOUNTER — Telehealth: Payer: Self-pay | Admitting: Internal Medicine

## 2017-02-15 NOTE — Telephone Encounter (Signed)
New Message   pt verbalized that she is calling for rn   Cervical cancer and possible surgery

## 2017-02-17 ENCOUNTER — Encounter: Payer: Self-pay | Admitting: Cardiology

## 2017-02-17 NOTE — Telephone Encounter (Signed)
Call back received from patient.  Left patient 2nd VM.  Asked Pt to leave a message on my answering machine if she would like to be seen by Dr. Lovena Le earlier than recall time to discuss heart issues with new cancer diagnosis.  This nurse name and # left for call back.

## 2017-02-21 DIAGNOSIS — R87610 Atypical squamous cells of undetermined significance on cytologic smear of cervix (ASC-US): Secondary | ICD-10-CM | POA: Diagnosis not present

## 2017-02-22 NOTE — Telephone Encounter (Signed)
Unable to return Pt phone call.  Sending letter to Pt with my direct phone number.  Will await further communication.

## 2017-03-01 DIAGNOSIS — R030 Elevated blood-pressure reading, without diagnosis of hypertension: Secondary | ICD-10-CM | POA: Diagnosis not present

## 2017-03-01 DIAGNOSIS — S61214A Laceration without foreign body of right ring finger without damage to nail, initial encounter: Secondary | ICD-10-CM | POA: Diagnosis not present

## 2017-03-03 ENCOUNTER — Other Ambulatory Visit: Payer: Self-pay | Admitting: Cardiology

## 2017-03-06 ENCOUNTER — Ambulatory Visit (INDEPENDENT_AMBULATORY_CARE_PROVIDER_SITE_OTHER): Payer: Medicare Other | Admitting: Internal Medicine

## 2017-03-06 ENCOUNTER — Encounter: Payer: Self-pay | Admitting: Internal Medicine

## 2017-03-06 VITALS — BP 140/72 | HR 72 | Resp 16 | Wt 163.0 lb

## 2017-03-06 DIAGNOSIS — I480 Paroxysmal atrial fibrillation: Secondary | ICD-10-CM

## 2017-03-06 DIAGNOSIS — I4589 Other specified conduction disorders: Secondary | ICD-10-CM | POA: Diagnosis not present

## 2017-03-06 LAB — CUP PACEART INCLINIC DEVICE CHECK
Battery Remaining Longevity: 95 mo
Date Time Interrogation Session: 20180924145839
Implantable Lead Implant Date: 20170921
Implantable Lead Location: 753859
Implantable Lead Location: 753860
Implantable Lead Model: 377
Implantable Lead Serial Number: 49580836
Implantable Pulse Generator Implant Date: 20170921
Lead Channel Impedance Value: 604 Ohm
Lead Channel Pacing Threshold Pulse Width: 0.4 ms
Lead Channel Pacing Threshold Pulse Width: 0.4 ms
Lead Channel Pacing Threshold Pulse Width: 0.4 ms
Lead Channel Sensing Intrinsic Amplitude: 12.1 mV
Lead Channel Sensing Intrinsic Amplitude: 13.4 mV
Lead Channel Sensing Intrinsic Amplitude: 3.7 mV
Lead Channel Setting Pacing Amplitude: 1.1 V
Lead Channel Setting Pacing Amplitude: 1.5 V
Lead Channel Setting Pacing Pulse Width: 0.4 ms
MDC IDC LEAD IMPLANT DT: 20170921
MDC IDC LEAD SERIAL: 49575519
MDC IDC MSMT LEADCHNL RA IMPEDANCE VALUE: 565 Ohm
MDC IDC MSMT LEADCHNL RA PACING THRESHOLD AMPLITUDE: 0.6 V
MDC IDC MSMT LEADCHNL RA PACING THRESHOLD AMPLITUDE: 0.6 V
MDC IDC MSMT LEADCHNL RA PACING THRESHOLD PULSEWIDTH: 0.4 ms
MDC IDC MSMT LEADCHNL RA SENSING INTR AMPL: 3.6 mV
MDC IDC MSMT LEADCHNL RV PACING THRESHOLD AMPLITUDE: 0.6 V
MDC IDC MSMT LEADCHNL RV PACING THRESHOLD AMPLITUDE: 0.8 V
MDC IDC MSMT LEADCHNL RV PACING THRESHOLD AMPLITUDE: 0.8 V
MDC IDC MSMT LEADCHNL RV PACING THRESHOLD PULSEWIDTH: 0.4 ms
MDC IDC STAT BRADY RA PERCENT PACED: 65 %
MDC IDC STAT BRADY RV PERCENT PACED: 1 %
Pulse Gen Model: 407145
Pulse Gen Serial Number: 68850891

## 2017-03-06 NOTE — Patient Instructions (Signed)
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
## 2017-03-09 ENCOUNTER — Ambulatory Visit: Payer: Medicare Other

## 2017-03-10 ENCOUNTER — Telehealth: Payer: Self-pay | Admitting: Gastroenterology

## 2017-03-10 NOTE — Telephone Encounter (Signed)
Informed patient that she is over due for a follow visit and has to be seen every year for prescription refills. Patient states "that's just a way to get co-pays from [place]". Patient then states she does not have to come into see him every year and she will not come in. I informed patient that is what Dr. Fuller Plan requires for her to get yearly medication refills. Patient states "I have had this disease for 35 years and never had to come in before with Dr. Sharlett Iles." Then patient stated that we refilled the medication last. I informed patient we last refilled in 03/2015 and she should have ran out of refills last year. Patient states she has not ran out. I asked if any other doctor has filled the medication and she states she doesn't know. She looked at her prescription bottle and states there is no doctor on the bottle. Patient states she sees her primary care doctor every year and gets her physical so she does not need to see [place]. I informed patient that she can get her refills through her PCP if they is more convenient to her and if she sees them more frequently. Patient states that we refilled it and we should refill it again. I informed patient that we will refill but she needs to make an appt. Patient states she is about to schedule major surgery and no thank you. I will not be scheduled an appt and proceeded to hang up.

## 2017-03-13 LAB — CUP PACEART REMOTE DEVICE CHECK
Date Time Interrogation Session: 20181001090454
Implantable Lead Implant Date: 20170921
Implantable Lead Implant Date: 20170921
Implantable Lead Model: 377
Implantable Lead Serial Number: 49575519
MDC IDC LEAD LOCATION: 753859
MDC IDC LEAD LOCATION: 753860
MDC IDC LEAD SERIAL: 49580836
MDC IDC PG IMPLANT DT: 20170921
MDC IDC PG SERIAL: 68850891
Pulse Gen Model: 407145

## 2017-03-17 ENCOUNTER — Encounter: Payer: Self-pay | Admitting: Internal Medicine

## 2017-04-03 ENCOUNTER — Telehealth: Payer: Self-pay | Admitting: Internal Medicine

## 2017-04-03 DIAGNOSIS — M069 Rheumatoid arthritis, unspecified: Secondary | ICD-10-CM

## 2017-04-03 DIAGNOSIS — Z789 Other specified health status: Secondary | ICD-10-CM

## 2017-04-03 NOTE — Telephone Encounter (Signed)
Pt called in and would like Dr Jenny Reichmann to Refer her to a RA Dr   Dr Yehuda Mao  [phone] [address] [place], [place] [address]

## 2017-04-03 NOTE — Telephone Encounter (Signed)
Pt has been informed Dewitt Hoes is out of office but referral should be put in once he returns.

## 2017-04-03 NOTE — Telephone Encounter (Signed)
Ok, this is done 

## 2017-04-03 NOTE — Addendum Note (Signed)
Addended by: Biagio Borg on: 04/03/2017 09:07 PM   Modules accepted: Orders

## 2017-04-10 ENCOUNTER — Telehealth: Payer: Self-pay | Admitting: Cardiology

## 2017-04-10 NOTE — Telephone Encounter (Signed)
Patient called and stated that she is not feeling wells. Per patient she is having erratic heart rates and have SVT symptoms. Informed her that a Device Tech RN will review her remote transmission and call her back with results. Patient also wants to a report printed so she can pick it up and take it with her to her CHF appt w/ Dr. Aundra Dubin on 04-11-2017.

## 2017-04-10 NOTE — Telephone Encounter (Signed)
Patient called stating that she's been ShOB and tired for the last 5 days. Patient wanted to know if she's had any recent episodes. I informed patient about the (3) HVR episodes-1:1 SVT and (1) AF episode x 46sec recorded in October. Patient verbalized understanding and states that she will stop by before her appt with Dr.McLean to pick up a copy of the report. Will leave the copy at the front desk. Patient verbalized understanding.

## 2017-04-11 ENCOUNTER — Ambulatory Visit (HOSPITAL_COMMUNITY)
Admission: RE | Admit: 2017-04-11 | Discharge: 2017-04-11 | Disposition: A | Discharge status: Home / Self Care | Payer: Medicare Other | Source: Ambulatory Visit | Attending: Cardiology | Admitting: Cardiology

## 2017-04-11 ENCOUNTER — Encounter (HOSPITAL_COMMUNITY): Payer: Self-pay | Admitting: Cardiology

## 2017-04-11 VITALS — BP 127/64 | HR 61 | Wt 157.8 lb

## 2017-04-11 DIAGNOSIS — R55 Syncope and collapse: Secondary | ICD-10-CM | POA: Diagnosis not present

## 2017-04-11 DIAGNOSIS — Z950 Presence of cardiac pacemaker: Secondary | ICD-10-CM

## 2017-04-11 DIAGNOSIS — F341 Dysthymic disorder: Secondary | ICD-10-CM | POA: Diagnosis not present

## 2017-04-11 DIAGNOSIS — K589 Irritable bowel syndrome without diarrhea: Secondary | ICD-10-CM | POA: Diagnosis not present

## 2017-04-11 DIAGNOSIS — I4581 Long QT syndrome: Secondary | ICD-10-CM | POA: Diagnosis not present

## 2017-04-11 DIAGNOSIS — I10 Essential (primary) hypertension: Secondary | ICD-10-CM | POA: Insufficient documentation

## 2017-04-11 DIAGNOSIS — R0789 Other chest pain: Secondary | ICD-10-CM | POA: Insufficient documentation

## 2017-04-11 DIAGNOSIS — R079 Chest pain, unspecified: Secondary | ICD-10-CM | POA: Diagnosis not present

## 2017-04-11 DIAGNOSIS — I442 Atrioventricular block, complete: Secondary | ICD-10-CM | POA: Insufficient documentation

## 2017-04-11 DIAGNOSIS — Z79899 Other long term (current) drug therapy: Secondary | ICD-10-CM | POA: Diagnosis not present

## 2017-04-11 DIAGNOSIS — E119 Type 2 diabetes mellitus without complications: Secondary | ICD-10-CM | POA: Insufficient documentation

## 2017-04-11 DIAGNOSIS — I471 Supraventricular tachycardia, unspecified: Secondary | ICD-10-CM

## 2017-04-11 DIAGNOSIS — Z96652 Presence of left artificial knee joint: Secondary | ICD-10-CM | POA: Insufficient documentation

## 2017-04-11 DIAGNOSIS — I480 Paroxysmal atrial fibrillation: Secondary | ICD-10-CM

## 2017-04-11 DIAGNOSIS — H579 Unspecified disorder of eye and adnexa: Secondary | ICD-10-CM | POA: Diagnosis not present

## 2017-04-11 DIAGNOSIS — Z8541 Personal history of malignant neoplasm of cervix uteri: Secondary | ICD-10-CM | POA: Insufficient documentation

## 2017-04-11 DIAGNOSIS — Z7982 Long term (current) use of aspirin: Secondary | ICD-10-CM | POA: Diagnosis not present

## 2017-04-11 LAB — BASIC METABOLIC PANEL
Anion gap: 10 (ref 5–15)
BUN: 9 mg/dL (ref 6–20)
CALCIUM: 10 mg/dL (ref 8.9–10.3)
CHLORIDE: 99 mmol/L — AB (ref 101–111)
CO2: 28 mmol/L (ref 22–32)
CREATININE: 0.83 mg/dL (ref 0.44–1.00)
GFR calc Af Amer: >60 mL/min (ref >60)
GFR calc non Af Amer: >60 mL/min (ref >60)
Glucose, Bld: 91 mg/dL (ref 65–99)
Potassium: 3.7 mmol/L (ref 3.5–5.1)
SODIUM: 137 mmol/L (ref 135–145)

## 2017-04-11 MED ORDER — FLECAINIDE ACETATE 100 MG PO TABS: 100.0000 mg | ORAL_TABLET | Freq: Two times a day (BID) | ORAL | 3 refills | Status: DC

## 2017-04-11 NOTE — Patient Instructions (Addendum)
Increase Flecainide 100 mg (1 tab), twice a day  Labs drawn today (if we do not call you, then your lab work was stable)   You have been referred to Dr. Lovena Le for an appointment in 1-2 months   Your physician recommends that you schedule a follow-up appointment in: 6 months with Dr. Aundra Dubin (we will call you)
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
## 2017-04-20 ENCOUNTER — Other Ambulatory Visit: Payer: Self-pay | Admitting: Internal Medicine

## 2017-05-08 ENCOUNTER — Telehealth: Payer: Self-pay | Admitting: Cardiology

## 2017-05-08 NOTE — Telephone Encounter (Signed)
Patient called and stated that she had an erratic heart rate all weekend starting on Friday night around 10:15 and she was still experiencing this on Saturday and Sunday. She stated that her blood pressure has been around 149/93 and that her blood pressure machine also shows her that her heart rate is erratic. Informed pt that I would send this note to the Device Tech RN and they will review her home monitor transmissions and call her back w/ the results.

## 2017-05-08 NOTE — Telephone Encounter (Signed)
Remote transmission reviewed. No episodes recorded since 04/12/17. Flecainide increased by Dr.McLean on 04/11/17. Patient is currently taking 100mg  BID.  Called patient and informed her that her device has not recorded any episodes since 04/12/17. Patient insists that she's had an "erratic" HR, ShOB, lack of energy, and cold/clammy sweats since Friday. Patient states that she's sure that she is not retaining fluid, bc her sister had CHF and "she knows what that looks like". Patient states that it is just hard for her to do anything. Low risk myoview on 09/10/15. I told her that I would forward the information to Dr.Taylor and his nurse and someone would contact her if anything further was recommended. Patient verbalized understanding and appreciation of information.

## 2017-05-09 NOTE — Telephone Encounter (Signed)
Per review of last office visit with Dr. Aundra Dubin Pt having runs of SVT and afib.  Dr. Aundra Dubin had increased Pt flecainide to 100 mg bid and requested Pt follow up with Dr. Lovena Le in 1-2 months.  No referral made to this office to schedule.  Spoke with Pt.  Pt having increased sob, heart racing and irregular heart rhythms.  Pt feeling better today.  Advised Pt this nurse would get follow up appt scheduled in December per Dr. Aundra Dubin to discuss possible need for SVT ablation.  Advised Pt to continue current medications until she meets with Dr. Lovena Le.  Pt indicates understanding.  Gave Pt information to scheduler to make follow up appt.  Appt made in December.  No further action needed at this time.

## 2017-05-10 ENCOUNTER — Ambulatory Visit (INDEPENDENT_AMBULATORY_CARE_PROVIDER_SITE_OTHER): Payer: Medicare Other | Admitting: *Deleted

## 2017-05-10 DIAGNOSIS — R55 Syncope and collapse: Secondary | ICD-10-CM

## 2017-05-10 NOTE — Progress Notes (Signed)
Remote pacemaker transmission.   

## 2017-05-12 ENCOUNTER — Encounter: Payer: Self-pay | Admitting: Cardiology

## 2017-05-23 LAB — CUP PACEART REMOTE DEVICE CHECK
Brady Statistic AP VP Percent: 0 %
Brady Statistic AP VS Percent: 72 %
Brady Statistic AS VP Percent: 0 %
Brady Statistic RA Percent Paced: 72 %
Date Time Interrogation Session: 20181211054054
Implantable Lead Model: 377
Implantable Lead Serial Number: 49580836
Implantable Pulse Generator Implant Date: 20170921
Lead Channel Impedance Value: 503 Ohm
Lead Channel Impedance Value: 571 Ohm
Lead Channel Pacing Threshold Amplitude: 0.6 V
Lead Channel Pacing Threshold Pulse Width: 0.4 ms
Lead Channel Setting Pacing Amplitude: 1.3 V
Lead Channel Setting Pacing Amplitude: 1.5 V
Lead Channel Setting Pacing Pulse Width: 0.4 ms
MDC IDC LEAD IMPLANT DT: 20170921
MDC IDC LEAD IMPLANT DT: 20170921
MDC IDC LEAD LOCATION: 753859
MDC IDC LEAD LOCATION: 753860
MDC IDC LEAD SERIAL: 49575519
MDC IDC MSMT LEADCHNL RA PACING THRESHOLD PULSEWIDTH: 0.4 ms
MDC IDC MSMT LEADCHNL RV PACING THRESHOLD AMPLITUDE: 0.8 V
MDC IDC STAT BRADY AS VS PERCENT: 28 %
MDC IDC STAT BRADY RV PERCENT PACED: 0 %
Pulse Gen Serial Number: 68850891

## 2017-05-26 ENCOUNTER — Encounter: Payer: Medicare Other | Admitting: Internal Medicine

## 2017-05-30 ENCOUNTER — Encounter: Payer: Self-pay | Admitting: Internal Medicine

## 2017-05-30 ENCOUNTER — Ambulatory Visit (INDEPENDENT_AMBULATORY_CARE_PROVIDER_SITE_OTHER): Payer: Medicare Other | Admitting: Internal Medicine

## 2017-05-30 VITALS — BP 136/90 | HR 65 | Ht 63.0 in | Wt 163.0 lb

## 2017-05-30 DIAGNOSIS — Z950 Presence of cardiac pacemaker: Secondary | ICD-10-CM | POA: Diagnosis not present

## 2017-05-30 DIAGNOSIS — R55 Syncope and collapse: Secondary | ICD-10-CM | POA: Diagnosis not present

## 2017-05-30 DIAGNOSIS — I495 Sick sinus syndrome: Secondary | ICD-10-CM

## 2017-05-30 DIAGNOSIS — I471 Supraventricular tachycardia: Secondary | ICD-10-CM | POA: Diagnosis not present

## 2017-05-30 DIAGNOSIS — I480 Paroxysmal atrial fibrillation: Secondary | ICD-10-CM

## 2017-05-30 MED ORDER — FUROSEMIDE 40 MG PO TABS: 40.0000 mg | ORAL_TABLET | Freq: Every day | ORAL | 3 refills | Status: DC
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
## 2017-05-30 NOTE — Patient Instructions (Addendum)
Medication Instructions:  Your physician has recommended you make the following change in your medication:  1.  DC triamterene-HCTZ 2.  Start taking furosemide (lasix) 40 mg daily.  Labwork: You will need a BMP in 7-10 days.  Testing/Procedures: None ordered.  Follow-Up: Your physician wants you to follow-up in: one year with Dr. Lovena Le.   You will receive a reminder letter in the mail two months in advance. If you don't receive a letter, please call our office to schedule the follow-up appointment.  Remote monitoring is used to monitor your Pacemaker from home. This monitoring reduces the number of office visits required to check your device to one time per year. It allows [place] to keep an eye on the functioning of your device to ensure it is working properly. You are scheduled for a device check from home on 08/09/2017. You may send your transmission at any time that day. If you have a wireless device, the transmission will be sent automatically. After your physician reviews your transmission, you will receive a postcard with your next transmission date.    Any Other Special Instructions Will Be Listed Below (If Applicable).     If you need a refill on your cardiac medications before your next appointment, please call your pharmacy.

## 2017-05-31 ENCOUNTER — Telehealth (HOSPITAL_COMMUNITY): Payer: Self-pay | Admitting: *Deleted

## 2017-05-31 LAB — CUP PACEART INCLINIC DEVICE CHECK
Implantable Lead Implant Date: 20170921
Implantable Lead Location: 753859
Implantable Lead Model: 377
Implantable Pulse Generator Implant Date: 20170921
MDC IDC LEAD IMPLANT DT: 20170921
MDC IDC LEAD LOCATION: 753860
MDC IDC LEAD SERIAL: 49575519
MDC IDC LEAD SERIAL: 49580836
MDC IDC SESS DTM: 20181219160252
Pulse Gen Model: 407145
Pulse Gen Serial Number: 68850891

## 2017-05-31 NOTE — Telephone Encounter (Signed)
Patient called in to get a second opinion from [facility]. Patient was seen by Dr.Taylor yesterday and was told to start lasix 40mg  daily for SOB/Peripheral edema.  Patient does not want to start lasix without having tests to see if her heart is pumping correctly and if she is truly retaining fluid. Per Dr.McLean order echocardiogram to see if her EF is still normal and will proceed from there. Patient aware and agreeable echo scheduled for 12/26 at 2pm.

## 2017-06-02 ENCOUNTER — Other Ambulatory Visit (HOSPITAL_COMMUNITY): Payer: Self-pay | Admitting: Cardiology

## 2017-06-02 DIAGNOSIS — I10 Essential (primary) hypertension: Secondary | ICD-10-CM

## 2017-06-05 ENCOUNTER — Ambulatory Visit (HOSPITAL_COMMUNITY): Payer: Medicare Other

## 2017-06-05 ENCOUNTER — Ambulatory Visit (HOSPITAL_COMMUNITY)
Admission: RE | Admit: 2017-06-05 | Discharge: 2017-06-05 | Disposition: A | Discharge status: Home / Self Care | Payer: Medicare Other | Source: Ambulatory Visit | Attending: Internal Medicine | Admitting: Internal Medicine

## 2017-06-05 DIAGNOSIS — I071 Rheumatic tricuspid insufficiency: Secondary | ICD-10-CM | POA: Diagnosis not present

## 2017-06-05 DIAGNOSIS — I10 Essential (primary) hypertension: Secondary | ICD-10-CM | POA: Diagnosis not present

## 2017-06-05 NOTE — Progress Notes (Signed)
  Echocardiogram 2D Echocardiogram has been performed.  Jennette Dubin 06/05/2017, 11:53 AM

## 2017-06-07 ENCOUNTER — Other Ambulatory Visit (HOSPITAL_COMMUNITY): Payer: Medicare Other

## 2017-06-08 ENCOUNTER — Other Ambulatory Visit: Payer: Self-pay

## 2017-06-12 DIAGNOSIS — R350 Frequency of micturition: Secondary | ICD-10-CM | POA: Diagnosis not present

## 2017-06-14 DIAGNOSIS — S5001XA Contusion of right elbow, initial encounter: Secondary | ICD-10-CM | POA: Diagnosis not present

## 2017-06-14 DIAGNOSIS — S0990XA Unspecified injury of head, initial encounter: Secondary | ICD-10-CM | POA: Diagnosis not present

## 2017-06-14 DIAGNOSIS — S7002XA Contusion of left hip, initial encounter: Secondary | ICD-10-CM | POA: Diagnosis not present

## 2017-06-14 DIAGNOSIS — S339XXA Sprain of unspecified parts of lumbar spine and pelvis, initial encounter: Secondary | ICD-10-CM | POA: Diagnosis not present

## 2017-06-16 ENCOUNTER — Other Ambulatory Visit: Payer: Medicare Other

## 2017-06-21 ENCOUNTER — Telehealth (HOSPITAL_COMMUNITY): Payer: Self-pay | Admitting: *Deleted

## 2017-06-21 NOTE — Telephone Encounter (Signed)
Call attempted, left message.

## 2017-06-21 NOTE — Telephone Encounter (Signed)
Patient called requesting echo results from 06/05/17. Discussed with results with patient. Patient said the whole reason echo was ordered was to see if she start Lasix. Patient saw Dr.Taylor and he ordered Lasix in 05/30/17. She did not want to start Lasix until she had an echo performed. She wants to know if Dr.McLean agrees that she should take lasix and if so why.   Message routed to [place] for advice.

## 2017-06-23 ENCOUNTER — Encounter: Payer: Self-pay | Admitting: Internal Medicine

## 2017-06-23 ENCOUNTER — Ambulatory Visit (INDEPENDENT_AMBULATORY_CARE_PROVIDER_SITE_OTHER): Payer: Medicare Other | Admitting: Internal Medicine

## 2017-06-23 DIAGNOSIS — J069 Acute upper respiratory infection, unspecified: Secondary | ICD-10-CM

## 2017-06-23 NOTE — Assessment & Plan Note (Signed)
Advised to take zyrtec over the counter and saline nose spray as needed. Call back in 1 week if not better.

## 2017-06-23 NOTE — Patient Instructions (Signed)
We will have you try zyrtec (cetirizine) for the congestion. Take 1 pill daily to help. It is okay to use saline nose spray as well which is just salt water.    Upper Respiratory Infection, Adult Most upper respiratory infections (URIs) are caused by a virus. A URI affects the nose, throat, and upper air passages. The most common type of URI is often called "the common cold." Follow these instructions at home:  Take medicines only as told by your doctor.  Gargle warm saltwater or take cough drops to comfort your throat as told by your doctor.  Use a warm mist humidifier or inhale steam from a shower to increase air moisture. This may make it easier to breathe.  Drink enough fluid to keep your pee (urine) clear or pale yellow.  Eat soups and other clear broths.  Have a healthy diet.  Rest as needed.  Go back to work when your fever is gone or your doctor says it is okay. ? You may need to stay home longer to avoid giving your URI to others. ? You can also wear a face mask and wash your hands often to prevent spread of the virus.  Use your inhaler more if you have asthma.  Do not use any tobacco products, including cigarettes, chewing tobacco, or electronic cigarettes. If you need help quitting, ask your doctor. Contact a doctor if:  You are getting worse, not better.  Your symptoms are not helped by medicine.  You have chills.  You are getting more short of breath.  You have brown or red mucus.  You have yellow or brown discharge from your nose.  You have pain in your face, especially when you bend forward.  You have a fever.  You have puffy (swollen) neck glands.  You have pain while swallowing.  You have white areas in the back of your throat. Get help right away if:  You have very bad or constant: ? Headache. ? Ear pain. ? Pain in your forehead, behind your eyes, and over your cheekbones (sinus pain). ? Chest pain.  You have long-lasting (chronic) lung  disease and any of the following: ? Wheezing. ? Long-lasting cough. ? Coughing up blood. ? A change in your usual mucus.  You have a stiff neck.  You have changes in your: ? Vision. ? Hearing. ? Thinking. ? Mood. This information is not intended to replace advice given to you by your health care provider. Make sure you discuss any questions you have with your health care provider. Document Released: 11/16/2007 Document Revised: 01/31/2016 Document Reviewed: 09/04/2013 Elsevier Interactive Patient Education  2018 [employer].

## 2017-06-23 NOTE — Progress Notes (Signed)
   Subjective:    Patient ID: Renee [name], female    DOB: 05-Dec-1945, 72 y.o.   MRN: [id number]  HPI The patient is a 72 YO female coming in for sneezing for several months. This has progressed into nose being stopped up in the last several days. She is concerned as she has had bronchitis in the past. She denies fevers or chills. She denies SOB. She has cough which started yesterday. She has not tried anything for this as she is not able to take anything over the counter without permission due to long QT. She does have a pacemaker. Overall feels like worsening. Denies known sick contacts.   Review of Systems  Constitutional: Positive for chills. Negative for activity change, appetite change, fatigue, fever and unexpected weight change.  HENT: Positive for congestion, postnasal drip and sinus pressure. Negative for ear discharge, ear pain, rhinorrhea, sinus pain, sneezing, sore throat, tinnitus, trouble swallowing and voice change.   Eyes: Negative.   Respiratory: Positive for cough. Negative for chest tightness, shortness of breath and wheezing.   Cardiovascular: Negative.   Gastrointestinal: Negative.   Neurological: Negative.       Objective:   Physical Exam  Constitutional: She is oriented to person, place, and time. She appears well-developed and well-nourished.  HENT:  Head: Normocephalic and atraumatic.  Oropharynx with redness and clear drainage, TMs normal bilaterally  Eyes: EOM are normal.  Neck: Normal range of motion. No thyromegaly present.  Cardiovascular: Normal rate and regular rhythm.  Pulmonary/Chest: Effort normal and breath sounds normal. No respiratory distress. She has no wheezes. She has no rales.  Abdominal: Soft.  Lymphadenopathy:    She has no cervical adenopathy.  Neurological: She is alert and oriented to person, place, and time.  Skin: Skin is warm and dry.   Vitals:   06/23/17 0757  BP: (!) 144/92  Pulse: 70  Temp: 98.1 F (36.7 C)  TempSrc: Oral    SpO2: 99%  Weight: 163 lb (73.9 kg)  Height: 5\' 3"  (1.6 m)      Assessment & Plan:

## 2017-07-04 ENCOUNTER — Telehealth: Payer: Self-pay | Admitting: Internal Medicine

## 2017-07-04 DIAGNOSIS — E119 Type 2 diabetes mellitus without complications: Secondary | ICD-10-CM

## 2017-07-04 NOTE — Telephone Encounter (Signed)
Ok, this is done 

## 2017-07-04 NOTE — Telephone Encounter (Signed)
Copied from [place]. Topic: Referral - Request >> Jul 04, 2017  3:02 PM Burnis Medin, NT wrote: Reason for CRM: Patient said she received a referral from the doctor but she never made an appointment and it has closed. Pt wanted to see if she can get another referral to [facility].

## 2017-07-06 ENCOUNTER — Other Ambulatory Visit: Payer: Self-pay | Admitting: Obstetrics and Gynecology
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
## 2017-07-07 ENCOUNTER — Other Ambulatory Visit: Payer: Self-pay | Admitting: Urology

## 2017-07-10 ENCOUNTER — Other Ambulatory Visit: Payer: Self-pay | Admitting: Urology

## 2017-07-18 ENCOUNTER — Ambulatory Visit (INDEPENDENT_AMBULATORY_CARE_PROVIDER_SITE_OTHER): Payer: Medicare Other

## 2017-07-18 ENCOUNTER — Ambulatory Visit: Payer: Medicare Other | Admitting: Podiatry

## 2017-07-18 DIAGNOSIS — M778 Other enthesopathies, not elsewhere classified: Secondary | ICD-10-CM

## 2017-07-18 DIAGNOSIS — B351 Tinea unguium: Secondary | ICD-10-CM | POA: Diagnosis not present

## 2017-07-18 DIAGNOSIS — M722 Plantar fascial fibromatosis: Secondary | ICD-10-CM | POA: Diagnosis not present

## 2017-07-18 DIAGNOSIS — M79676 Pain in unspecified toe(s): Secondary | ICD-10-CM | POA: Diagnosis not present

## 2017-07-18 DIAGNOSIS — M779 Enthesopathy, unspecified: Principal | ICD-10-CM

## 2017-07-18 DIAGNOSIS — M7750 Other enthesopathy of unspecified foot: Secondary | ICD-10-CM

## 2017-07-18 DIAGNOSIS — N8111 Cystocele, midline: Secondary | ICD-10-CM | POA: Diagnosis not present

## 2017-07-18 NOTE — Patient Instructions (Signed)

## 2017-07-18 NOTE — Progress Notes (Signed)
Subjective:  Patient ID: Renee [name], female    DOB: [birth year],  MRN: [id number] HPI Chief Complaint  Patient presents with  . debride    Debride. Diabetes type 2 sugar-113 A1c-  . Foot Pain    B/L plantar and bottom heel pain x 2 mos; 7/10 achy pain Tx: aleve Pt. denies injury    72 y.o. female presents with the above complaint.     Past Medical History:  Diagnosis Date  . Anxiety   . AV nodal re-entry tachycardia [facility])    a. s/p RFA 1993- Dr. Rolland Porter, [place]  . Broken neck ([place])    1993  . Cancer (HCC)    hx of cervical   ca  . Chronic chest pain    a. 2005 Cath: Mild nonobstructive plaque (30-40% LCX);  b. 08/2011 low risk myoview;  c. 09/2011 Coronary CT: NL cors w ca score of 0.  . Chronic pain syndrome   . Complication of anesthesia    blood pressure and heart dropped in 2009    . Concussion    1979, 1993  . Degenerative joint disease    a. s/p L Total Knee Arthroplasty.  . Diet-controlled type 2 diabetes mellitus ([place])   . Diverticulosis of colon   . Duodenitis without hemorrhage   . Fibromyalgia   . GERD (gastroesophageal reflux disease)   . Hemorrhoids   . Hypertension   . Irritable bowel syndrome   . Long QT interval    a. mild - advised to avoid meds that may prolong QT.  Marland Kitchen Neuropathy   . Osteoporosis   . SVT (supraventricular tachycardia) (HCC)    a. nonsustained SVT - previously offered flecainide but refused;  b. 12/2012 30 day event monitor w/o significant arrhythmias.  . Thyroid nodule    Past Surgical History:  Procedure Laterality Date  . ABLATION OF DYSRHYTHMIC FOCUS  1993  . BIOPSY THYROID    . CARDIAC ELECTROPHYSIOLOGY STUDY AND ABLATION     atrioventricular nodal reentant tachycardia  . CHOLECYSTECTOMY  1980  . COLONOSCOPY  06/25/2008   Diverticulosis and Hemorrhoids  . COLPOSCOPY    . CORONARY ANGIOPLASTY  2007  . EP IMPLANTABLE DEVICE N/A 03/03/2016   Procedure: Pacemaker Implant;  Surgeon: Evans Lance, MD;  Location: [facility];  Service: Cardiovascular;  Laterality: N/A;  . ESOPHAGOGASTRODUODENOSCOPY    . HYSTEROSCOPY  2002   with resection of endometrial polyps. by Dr.MCPhail  . HYSTEROSCOPY W/D&C N/A 02/05/2015   Procedure: DILATATION AND CURETTAGE /HYSTEROSCOPY;  Surgeon: Vanessa Kick, MD;  Location: [facility];  Service: Gynecology;  Laterality: N/A;  . LOOP RECORDER IMPLANT  08/21/2013   MDT LinQ implanted by Dr Lovena Le for syncope  . LOOP RECORDER IMPLANT N/A 08/21/2013   Procedure: LOOP RECORDER IMPLANT;  Surgeon: Evans Lance, MD;  Location: [facility] CATH LAB;  Service: Cardiovascular;  Laterality: N/A;  . ORIF RADIAL HEAD / NECK FRACTURE  09/05/88  . TILT TABLE STUDY N/A 05/15/2013   Procedure: TILT TABLE STUDY;  Surgeon: Deboraha Sprang, MD;  Location[facility] C[facility] LAB;  Service: Cardiovascular;  Laterality: N/A;  . TOTAL HIP ARTHROPLASTY  10/10   Left, at [facility]    Current Outpatient Medications:  .  ALPRAZolam (XANAX) 0.5 MG tablet, Take 0.5-1 tablets (0.25-0.5 mg total) by mouth 2 (two) times daily as needed for anxiety., Disp: 90 tablet, Rfl: 1 .  aspirin EC 81 MG tablet, Take 81 mg by mouth every evening. ,  Disp: , Rfl:  .  cetirizine (ZYRTEC) 10 MG tablet, Take 10 mg by mouth daily as needed for allergies., Disp: , Rfl:  .  diltiazem (CARDIZEM CD) 120 MG 24 hr capsule, Take 1 capsule (120 mg total) by mouth every morning., Disp: 90 capsule, Rfl: 3 .  flecainide (TAMBOCOR) 100 MG tablet, Take 1 tablet (100 mg total) by mouth 2 (two) times daily., Disp: 60 tablet, Rfl: 3 .  glucose blood (ACCU-CHEK SMARTVIEW) test strip, 1 each by Other route 2 (two) times daily. Use to check blood sugars twice a day Dx e11.9, Disp: 300 each, Rfl: 2 .  Lancets (ACCU-CHEK MULTICLIX) lancets, 1 each by Other route 2 (two) times daily. Use to help check blood sugars twice a day Dx e11.9, Disp: 1 each, Rfl: 0 .  naproxen sodium (ALEVE) 220 MG tablet, Take 440 mg by mouth 2 (two) times daily as needed (for arthritis pain.). ,  Disp: , Rfl:  .  nitroGLYCERIN (NITROSTAT) 0.4 MG SL tablet, Place 1 tablet (0.4 mg total) under the tongue every 5 (five) minutes as needed for chest pain., Disp: 30 tablet, Rfl: 0 .  omeprazole (PRILOSEC) 40 MG capsule, Take 1 capsule (40 mg total) by mouth every evening. (Patient taking differently: Take 40 mg by mouth every evening. (1700)), Disp: 90 capsule, Rfl: 2 .  potassium chloride (MICRO-K) 10 MEQ CR capsule, TAKE 1 CAPSULE BY MOUTH  DAILY., Disp: 90 capsule, Rfl: 0 .  sodium chloride (OCEAN) 0.65 % SOLN nasal spray, Place 1-2 sprays into both nostrils 4 (four) times daily as needed for congestion., Disp: , Rfl:  .  triamterene-hydrochlorothiazide (DYAZIDE) 37.5-25 MG capsule, Take 1 capsule by mouth daily., Disp: , Rfl:  .  zolpidem (AMBIEN) 5 MG tablet, Take 0.5-1 tablets (2.5-5 mg total) by mouth at bedtime as needed for sleep., Disp: 30 tablet, Rfl: 1 .  furosemide (LASIX) 40 MG tablet, Take 1 tablet (40 mg total) by mouth daily. (Patient not taking: Reported on 07/18/2017), Disp: 90 tablet, Rfl: 3  Allergies  Allergen Reactions  . Cyclobenzaprine Other (See Comments)    Pt states that this medication does not work for her.    . Azithromycin Other (See Comments)    Cannot take due to prolonged QT  . Adhesive [Tape] Itching, Rash and Other (See Comments)    Reaction:  Blisters  Pt states that she is allergic to the adhesive backing of EKG pads if left on for more than 24 hours Patient states she tolerates paper tape ok  . Moxifloxacin Other (See Comments)    Unknown allergic reaction many years ago  . Quinolones Other (See Comments)    Unknown allergic reaction many years ago   Review of Systems  All other systems reviewed and are negative.  Objective:  There were no vitals filed for this visit.  General: Well developed, nourished, in no acute distress, alert and oriented x3   Dermatological: Skin is warm, dry and supple bilateral. Nails x 10 are well maintained; remaining  integument appears unremarkable at this time. There are no open sores, no preulcerative lesions, no rash or signs of infection present.  Hallux nails are thick yellow dystrophic-like mycotic.  Vascular: Dorsalis Pedis artery and Posterior Tibial artery pedal pulses are 2/4 bilateral with immedate capillary fill time. Pedal hair growth present. No varicosities and no lower extremity edema present bilateral.   Neruologic: Grossly intact via light touch bilateral. Vibratory intact via tuning fork bilateral. Protective threshold with Jobie Quaker  monofilament intact to all pedal sites bilateral. Patellar and Achilles deep tendon reflexes 2+ bilateral. No Babinski or clonus noted bilateral.   Musculoskeletal: No gross boney pedal deformities bilateral. No pain, crepitus, or limitation noted with foot and ankle range of motion bilateral. Muscular strength 5/5 in all groups tested bilateral.  She has pain to palpation medial continue tubercles bilateral.  Limited range of motion of the first metatarsophalangeal joint.  Gait: Unassisted, Nonantalgic.    Radiographs:  3 views bilateral demonstrate no acute injuries.  Does demonstrate a soft tissue increase in density of plantar fascial calcaneal insertion sites bilaterally.  Osteoarthritis of the first metatarsophalangeal joints bilateral.  Assessment & Plan:   Assessment: Pain in limb secondary to under fasciitis bilateral.  Pain in limb secondary to onychomycosis  Plan: Discussed etiology pathology conservative versus surgical therapies.  At this point after sterile Betadine skin prep injected bilateral heels with 20 mg Kenalog 5 mg Marcaine point maximal tenderness medial aspect of the heel.  Tolerated procedure well without complications.  Debrided toenails 1 through 5 bilateral cover service secondary to pain.  Lesser plantar fascial brace.  Discussed appropriate shoe gear stretching exercises ice therapy.     Dohn Stclair T[place], [place]

## 2017-07-24 NOTE — Patient Instructions (Signed)
Renee [name]  07/24/2017   Your procedure is scheduled on: 08-01-17   Report to [facility]: Renee [name]  Entrance   Follow signs to Short Stay on first floor at 530 AM   Call this number if you have problems the morning of surgery [phone]     Remember: Do not eat food or drink liquids :After Midnight.     Take these medicines the morning of surgery with A SIP OF WATER: FLECAINIDE, DILTIAZEM, CETIRIZINE IF NEEDED, NASAL SPRAY IF NEEDED                                You may not have any metal on your body including hair pins and              piercings  Do not wear jewelry, make-up, lotions, powders or perfumes, deodorant             Do not wear nail polish.  Do not shave  48 hours prior to surgery.            Do not bring valuables to the hospital. [place].  Contacts, dentures or bridgework may not be worn into surgery.  Leave suitcase in the car. After surgery it may be brought to your room.                 Please read over the following fact sheets you were given: _____________________________________________________________________  How to Manage Your Diabetes Before and After Surgery  Why is it important to control my blood sugar before and after surgery? . Improving blood sugar levels before and after surgery helps healing and can limit problems. . A way of improving blood sugar control is eating a healthy diet by: o  Eating less sugar and carbohydrates o  Increasing activity/exercise o  Talking with your doctor about reaching your blood sugar goals . High blood sugars (greater than 180 mg/dL) can raise your risk of infections and slow your recovery, so you will need to focus on controlling your diabetes during the weeks before surgery. . Make sure that the doctor who takes care of your diabetes knows about your planned surgery including the date and location.  How do I manage my blood sugar  before surgery? . Check your blood sugar at least 4 times a day, starting 2 days before surgery, to make sure that the level is not too high or low. o Check your blood sugar the morning of your surgery when you wake up and every 2 hours until you get to the Short Stay unit. . If your blood sugar is less than 70 mg/dL, you will need to treat for low blood sugar: o Do not take insulin. o Treat a low blood sugar (less than 70 mg/dL) with  cup of clear juice (cranberry or apple), 4 glucose tablets, OR glucose gel. o Recheck blood sugar in 15 minutes after treatment (to make sure it is greater than 70 mg/dL). If your blood sugar is not greater than 70 mg/dL on recheck, call [phone] for further instructions. . Report your blood sugar to the short stay nurse when you get to Short Stay.  . If you are admitted to the hospital after surgery: o Your  blood sugar will be checked by the staff and you will probably be given insulin after surgery (instead of oral diabetes medicines) to make sure you have good blood sugar levels. o The goal for blood sugar control after surgery is 80-180 mg/dL.    Patient Signature:  Date:   Nurse Signature:  Date:   Reviewed and Endorsed by [name] Patient Education Committee, August 2015            [facility] - Preparing for Surgery Before surgery, you can play an important role.  Because skin is not sterile, your skin needs to be as free of germs as possible.  You can reduce the number of germs on your skin by washing with CHG (chlorahexidine gluconate) soap before surgery.  CHG is an antiseptic cleaner which kills germs and bonds with the skin to continue killing germs even after washing. Please DO NOT use if you have an allergy to CHG or antibacterial soaps.  If your skin becomes reddened/irritated stop using the CHG and inform your nurse when you arrive at Short Stay. Do not shave (including legs and underarms) for at least 48 hours prior to the first CHG  shower.  You may shave your face/neck. Please follow these instructions carefully:  1.  Shower with CHG Soap the night before surgery and the  morning of Surgery.  2.  If you choose to wash your hair, wash your hair first as usual with your  normal  shampoo.  3.  After you shampoo, rinse your hair and body thoroughly to remove the  shampoo.                           4.  Use CHG as you would any other liquid soap.  You can apply chg directly  to the skin and wash                       Gently with a scrungie or clean washcloth.  5.  Apply the CHG Soap to your body ONLY FROM THE NECK DOWN.   Do not use on face/ open                           Wound or open sores. Avoid contact with eyes, ears mouth and genitals (private parts).                       Wash face,  Genitals (private parts) with your normal soap.             6.  Wash thoroughly, paying special attention to the area where your surgery  will be performed.  7.  Thoroughly rinse your body with warm water from the neck down.  8.  DO NOT shower/wash with your normal soap after using and rinsing off  the CHG Soap.                9.  Pat yourself dry with a clean towel.            10.  Wear clean pajamas.            11.  Place clean sheets on your bed the night of your first shower and do not  sleep with pets. Day of Surgery : Do not apply any lotions/deodorants the morning of surgery.  Please wear clean clothes to the hospital/surgery  center.  FAILURE TO FOLLOW THESE INSTRUCTIONS MAY RESULT IN THE CANCELLATION OF YOUR SURGERY PATIENT SIGNATURE_________________________________  NURSE SIGNATURE__________________________________  ________________________________________________________________________   Renee [name]  An incentive spirometer is a tool that can help keep your lungs clear and active. This tool measures how well you are filling your lungs with each breath. Taking long deep breaths may help reverse or decrease the chance  of developing breathing (pulmonary) problems (especially infection) following:  A long period of time when you are unable to move or be active. BEFORE THE PROCEDURE   If the spirometer includes an indicator to show your best effort, your nurse or respiratory therapist will set it to a desired goal.  If possible, sit up straight or lean slightly forward. Try not to slouch.  Hold the incentive spirometer in an upright position. INSTRUCTIONS FOR USE  1. Sit on the edge of your bed if possible, or sit up as far as you can in bed or on a chair. 2. Hold the incentive spirometer in an upright position. 3. Breathe out normally. 4. Place the mouthpiece in your mouth and seal your lips tightly around it. 5. Breathe in slowly and as deeply as possible, raising the piston or the ball toward the top of the column. 6. Hold your breath for 3-5 seconds or for as long as possible. Allow the piston or ball to fall to the bottom of the column. 7. Remove the mouthpiece from your mouth and breathe out normally. 8. Rest for a few seconds and repeat Steps 1 through 7 at least 10 times every 1-2 hours when you are awake. Take your time and take a few normal breaths between deep breaths. 9. The spirometer may include an indicator to show your best effort. Use the indicator as a goal to work toward during each repetition. 10. After each set of 10 deep breaths, practice coughing to be sure your lungs are clear. If you have an incision (the cut made at the time of surgery), support your incision when coughing by placing a pillow or rolled up towels firmly against it. Once you are able to get out of bed, walk around indoors and cough well. You may stop using the incentive spirometer when instructed by your caregiver.  RISKS AND COMPLICATIONS  Take your time so you do not get dizzy or light-headed.  If you are in pain, you may need to take or ask for pain medication before doing incentive spirometry. It is harder to  take a deep breath if you are having pain. AFTER USE  Rest and breathe slowly and easily.  It can be helpful to keep track of a log of your progress. Your caregiver can provide you with a simple table to help with this. If you are using the spirometer at home, follow these instructions: [place] IF:   You are having difficultly using the spirometer.  You have trouble using the spirometer as often as instructed.  Your pain medication is not giving enough relief while using the spirometer.  You develop fever of 100.5 F (38.1 C) or higher. SEEK IMMEDIATE MEDICAL CARE IF:   You cough up bloody sputum that had not been present before.  You develop fever of 102 F (38.9 C) or greater.  You develop worsening pain at or near the incision site. MAKE SURE YOU:   Understand these instructions.  Will watch your condition.  Will get help right away if you are not doing well or get worse. Document  Released: 10/10/2006 Document Revised: 08/22/2011 Document Reviewed: 12/11/2006 Exit[employer] Patient Information 2014 Exit[employer], [place].   ________________________________________________________________________  WHAT IS A BLOOD TRANSFUSION? Blood Transfusion Information  A transfusion is the replacement of blood or some of its parts. Blood is made up of multiple cells which provide different functions.  Red blood cells carry oxygen and are used for blood loss replacement.  White blood cells fight against infection.  Platelets control bleeding.  Plasma helps clot blood.  Other blood products are available for specialized needs, such as hemophilia or other clotting disorders. BEFORE THE TRANSFUSION  Who gives blood for transfusions?   Healthy volunteers who are fully evaluated to make sure their blood is safe. This is blood bank blood. Transfusion therapy is the safest it has ever been in the practice of medicine. Before blood is taken from a donor, a complete history is taken to  make sure that person has no history of diseases nor engages in risky social behavior (examples are intravenous drug use or sexual activity with multiple partners). The donor's travel history is screened to minimize risk of transmitting infections, such as malaria. The donated blood is tested for signs of infectious diseases, such as HIV and hepatitis. The blood is then tested to be sure it is compatible with you in order to minimize the chance of a transfusion reaction. If you or a relative donates blood, this is often done in anticipation of surgery and is not appropriate for emergency situations. It takes many days to process the donated blood. RISKS AND COMPLICATIONS Although transfusion therapy is very safe and saves many lives, the [name] dangers of transfusion include:   Getting an infectious disease.  Developing a transfusion reaction. This is an allergic reaction to something in the blood you were given. Every precaution is taken to prevent this. The decision to have a blood transfusion has been considered carefully by your caregiver before blood is given. Blood is not given unless the benefits outweigh the risks. AFTER THE TRANSFUSION  Right after receiving a blood transfusion, you will usually feel much better and more energetic. This is especially true if your red blood cells have gotten low (anemic). The transfusion raises the level of the red blood cells which carry oxygen, and this usually causes an energy increase.  The nurse administering the transfusion will monitor you carefully for complications. HOME CARE INSTRUCTIONS  No special instructions are needed after a transfusion. You may find your energy is better. Speak with your caregiver about any limitations on activity for underlying diseases you may have. SEEK MEDICAL CARE IF:   Your condition is not improving after your transfusion.  You develop redness or irritation at the intravenous (IV) site. SEEK IMMEDIATE MEDICAL CARE  IF:  Any of the following symptoms occur over the next 12 hours:  Shaking chills.  You have a temperature by mouth above 102 F (38.9 C), not controlled by medicine.  Chest, back, or muscle pain.  People around you feel you are not acting correctly or are confused.  Shortness of breath or difficulty breathing.  Dizziness and fainting.  You get a rash or develop hives.  You have a decrease in urine output.  Your urine turns a dark color or changes to pink, red, or brown. Any of the following symptoms occur over the next 10 days:  You have a temperature by mouth above 102 F (38.9 C), not controlled by medicine.  Shortness of breath.  Weakness after normal activity.  The white part  of the eye turns yellow (jaundice).  You have a decrease in the amount of urine or are urinating less often.  Your urine turns a dark color or changes to pink, red, or brown. Document Released: 05/27/2000 Document Revised: 08/22/2011 Document Reviewed: 01/14/2008 [facility] Patient Information 2014 [place], [place].  _______________________________________________________________________

## 2017-07-24 NOTE — Progress Notes (Signed)
PACMAKER DEVICE ORDERS ON CHART; DR [place] DR. GREGG TAYLOR 05-30-17 Epic  EKG 05-30-17 Epic  ECHO 06-05-17 Epic  CXR 10-09-16 Epic  STRESS TEST 09-30-15 Epic

## 2017-07-25 ENCOUNTER — Other Ambulatory Visit: Payer: Self-pay

## 2017-07-25 ENCOUNTER — Telehealth: Payer: Self-pay | Admitting: Nurse Practitioner

## 2017-07-25 ENCOUNTER — Encounter (HOSPITAL_COMMUNITY): Payer: Self-pay

## 2017-07-25 ENCOUNTER — Encounter (HOSPITAL_COMMUNITY)
Admission: RE | Admit: 2017-07-25 | Discharge: 2017-07-25 | Disposition: A | Discharge status: Home / Self Care | Payer: Medicare Other | Source: Ambulatory Visit | Attending: Urology | Admitting: Urology

## 2017-07-25 ENCOUNTER — Telehealth (HOSPITAL_COMMUNITY): Payer: Self-pay | Admitting: *Deleted

## 2017-07-25 ENCOUNTER — Telehealth: Payer: Self-pay | Admitting: *Deleted

## 2017-07-25 DIAGNOSIS — I4891 Unspecified atrial fibrillation: Secondary | ICD-10-CM | POA: Diagnosis not present

## 2017-07-25 DIAGNOSIS — I10 Essential (primary) hypertension: Secondary | ICD-10-CM | POA: Insufficient documentation

## 2017-07-25 DIAGNOSIS — Z01812 Encounter for preprocedural laboratory examination: Secondary | ICD-10-CM | POA: Diagnosis not present

## 2017-07-25 DIAGNOSIS — I2510 Atherosclerotic heart disease of native coronary artery without angina pectoris: Secondary | ICD-10-CM | POA: Diagnosis not present

## 2017-07-25 DIAGNOSIS — N814 Uterovaginal prolapse, unspecified: Secondary | ICD-10-CM | POA: Insufficient documentation

## 2017-07-25 DIAGNOSIS — F329 Major depressive disorder, single episode, unspecified: Secondary | ICD-10-CM | POA: Diagnosis not present

## 2017-07-25 DIAGNOSIS — R000 Tachycardia, unspecified: Secondary | ICD-10-CM | POA: Diagnosis not present

## 2017-07-25 DIAGNOSIS — M797 Fibromyalgia: Secondary | ICD-10-CM | POA: Diagnosis not present

## 2017-07-25 DIAGNOSIS — E119 Type 2 diabetes mellitus without complications: Secondary | ICD-10-CM | POA: Insufficient documentation

## 2017-07-25 DIAGNOSIS — F419 Anxiety disorder, unspecified: Secondary | ICD-10-CM | POA: Insufficient documentation

## 2017-07-25 HISTORY — DX: Paroxysmal atrial fibrillation: I48.0

## 2017-07-25 HISTORY — DX: Cardiac arrhythmia, unspecified: I49.9

## 2017-07-25 LAB — CBC
HEMATOCRIT: 42.1 % (ref 36.0–46.0)
HEMOGLOBIN: 13.9 g/dL (ref 12.0–15.0)
MCH: 27.2 pg (ref 26.0–34.0)
MCHC: 33 g/dL (ref 30.0–36.0)
MCV: 82.4 fL (ref 78.0–100.0)
Platelets: 269 10*3/uL (ref 150–400)
RBC: 5.11 MIL/uL (ref 3.87–5.11)
RDW: 13.1 % (ref 11.5–15.5)
WBC: 6 10*3/uL (ref 4.0–10.5)

## 2017-07-25 LAB — ABO/RH: ABO/RH(D): A POS

## 2017-07-25 LAB — BASIC METABOLIC PANEL
ANION GAP: 11 (ref 5–15)
BUN: 12 mg/dL (ref 6–20)
CO2: 26 mmol/L (ref 22–32)
Calcium: 9.9 mg/dL (ref 8.9–10.3)
Chloride: 102 mmol/L (ref 101–111)
Creatinine, Ser: 0.76 mg/dL (ref 0.44–1.00)
GFR calc non Af Amer: >60 mL/min (ref >60)
Glucose, Bld: 100 mg/dL — ABNORMAL HIGH (ref 65–99)
POTASSIUM: 4.3 mmol/L (ref 3.5–5.1)
SODIUM: 139 mmol/L (ref 135–145)

## 2017-07-25 LAB — HEMOGLOBIN A1C
HEMOGLOBIN A1C: 5.6 % (ref 4.8–5.6)
Mean Plasma Glucose: 114.02 mg/dL

## 2017-07-25 LAB — PROTIME-INR
INR: 0.92
Prothrombin Time: 12.2 seconds (ref 11.4–15.2)

## 2017-07-25 NOTE — Telephone Encounter (Signed)
   Pt called to report that she was recently rx nitrofurantoin to be taken prior to pending surgery.  She wants to make sure that this will not lengthen her QT.  We discussed that this drug may cause nonspecific changes on the ECG but that it should not lengthen QT and she should be safe to initiate it.  Caller verbalized understanding and was grateful for the call back.  Murray Hodgkins, NP 07/25/2017, 6:08 PM

## 2017-07-25 NOTE — Telephone Encounter (Signed)
Received surgical clearance from [facility] for patient to have procedure done on 08/01/2017.  Dr. Aundra Dubin has cleared patient for surgery and she is to hold Aspirin 5 days prior to procedure.  Clearance faxed to [phone].

## 2017-07-25 NOTE — Telephone Encounter (Signed)
Renee [name] calling regarding HR reading yesterday. She has felt palpitations/ heart racing yesterday and Saturday. Average HR reading for the last few days have been in the 70s per [employer] home monitoring. No episodes. She reports that she was told Dr. Lovena Le "signed off" on her surgery that she is to have soon. No further questions.

## 2017-07-25 NOTE — Progress Notes (Signed)
At PAT appt, patient reports to RN that just yesterday she felt like her heart was racing and checked her blood pressure, and fund it to be in to 200s/100s and her heart rate was 1122. She describes feel breathless and sweaty but not having nay chest pain or pressure. She reports that she's been having these kinds of "spells" since they put in her pacemaker. She claims however that both her cardiologists have cleared her for the surgery but no clearances have been received.   Because of such recent symptoms RN contacted anesthesia Dr Annye Asa for consult. RN reviewed [place] cardiology notes , heart medication list, and patients recent symptoms with Dr Glennon Mac. Per Dr Glennon Mac, patient may proceed with this elective surgery as long as she maintains her prescribed heart medications and has written clearance from cardiology .  RN called and LVMM with Pam at [facility] to make aware. Pam later returned RNs call. Pam report that Dr Philis Pique hysterectomy portion of surgery has been cancelled. Have sates she will reach out to cardiologist Dr Loralie Champagne with [facility] to obtain clearance. RN will continue to F/U.

## 2017-07-27 NOTE — Progress Notes (Signed)
Cardiac clearance Dr Loralie Champagne 07-25-17 on chart

## 2017-07-31 ENCOUNTER — Other Ambulatory Visit: Payer: Self-pay

## 2017-07-31 ENCOUNTER — Emergency Department (HOSPITAL_COMMUNITY): Payer: Medicare Other

## 2017-07-31 ENCOUNTER — Emergency Department (HOSPITAL_COMMUNITY)
Admission: EM | Admit: 2017-07-31 | Discharge: 2017-07-31 | Disposition: A | Discharge status: Home / Self Care | Payer: Medicare Other | Attending: Emergency Medicine | Admitting: Emergency Medicine

## 2017-07-31 ENCOUNTER — Telehealth (HOSPITAL_COMMUNITY): Payer: Self-pay | Admitting: *Deleted

## 2017-07-31 ENCOUNTER — Telehealth: Payer: Self-pay | Admitting: Internal Medicine

## 2017-07-31 ENCOUNTER — Encounter (HOSPITAL_COMMUNITY): Payer: Self-pay

## 2017-07-31 DIAGNOSIS — R42 Dizziness and giddiness: Secondary | ICD-10-CM | POA: Insufficient documentation

## 2017-07-31 DIAGNOSIS — R002 Palpitations: Secondary | ICD-10-CM | POA: Diagnosis not present

## 2017-07-31 DIAGNOSIS — R531 Weakness: Secondary | ICD-10-CM | POA: Diagnosis not present

## 2017-07-31 DIAGNOSIS — Z950 Presence of cardiac pacemaker: Secondary | ICD-10-CM | POA: Insufficient documentation

## 2017-07-31 DIAGNOSIS — E119 Type 2 diabetes mellitus without complications: Secondary | ICD-10-CM | POA: Diagnosis not present

## 2017-07-31 DIAGNOSIS — I10 Essential (primary) hypertension: Secondary | ICD-10-CM | POA: Insufficient documentation

## 2017-07-31 DIAGNOSIS — R3989 Other symptoms and signs involving the genitourinary system: Secondary | ICD-10-CM | POA: Insufficient documentation

## 2017-07-31 DIAGNOSIS — I4892 Unspecified atrial flutter: Secondary | ICD-10-CM | POA: Diagnosis not present

## 2017-07-31 DIAGNOSIS — Z79899 Other long term (current) drug therapy: Secondary | ICD-10-CM | POA: Diagnosis not present

## 2017-07-31 DIAGNOSIS — R0602 Shortness of breath: Secondary | ICD-10-CM | POA: Diagnosis not present

## 2017-07-31 DIAGNOSIS — Z7982 Long term (current) use of aspirin: Secondary | ICD-10-CM | POA: Insufficient documentation

## 2017-07-31 LAB — BASIC METABOLIC PANEL
ANION GAP: 11 (ref 5–15)
BUN: 8 mg/dL (ref 6–20)
CHLORIDE: 103 mmol/L (ref 101–111)
CO2: 25 mmol/L (ref 22–32)
Calcium: 10 mg/dL (ref 8.9–10.3)
Creatinine, Ser: 0.76 mg/dL (ref 0.44–1.00)
GFR calc Af Amer: >60 mL/min (ref >60)
GFR calc non Af Amer: >60 mL/min (ref >60)
GLUCOSE: 94 mg/dL (ref 65–99)
POTASSIUM: 3.4 mmol/L — AB (ref 3.5–5.1)
SODIUM: 139 mmol/L (ref 135–145)

## 2017-07-31 LAB — CBC WITH DIFFERENTIAL/PLATELET
Basophils Absolute: 0 10*3/uL (ref 0.0–0.1)
Basophils Relative: 1 %
Eosinophils Absolute: 0.1 10*3/uL (ref 0.0–0.7)
Eosinophils Relative: 1 %
HCT: 43.6 % (ref 36.0–46.0)
HEMOGLOBIN: 14.6 g/dL (ref 12.0–15.0)
LYMPHS ABS: 2.4 10*3/uL (ref 0.7–4.0)
LYMPHS PCT: 40 %
MCH: 28 pg (ref 26.0–34.0)
MCHC: 33.5 g/dL (ref 30.0–36.0)
MCV: 83.7 fL (ref 78.0–100.0)
Monocytes Absolute: 0.3 10*3/uL (ref 0.1–1.0)
Monocytes Relative: 6 %
NEUTROS PCT: 52 %
Neutro Abs: 3.2 10*3/uL (ref 1.7–7.7)
Platelets: 258 10*3/uL (ref 150–400)
RBC: 5.21 MIL/uL — AB (ref 3.87–5.11)
RDW: 13.5 % (ref 11.5–15.5)
WBC: 6 10*3/uL (ref 4.0–10.5)

## 2017-07-31 LAB — I-STAT TROPONIN, ED: TROPONIN I, POC: 0 ng/mL (ref 0.00–0.08)

## 2017-07-31 LAB — BRAIN NATRIURETIC PEPTIDE: B NATRIURETIC PEPTIDE 5: 54.7 pg/mL (ref 0.0–100.0)

## 2017-07-31 MED ORDER — GENTAMICIN SULFATE 40 MG/ML IJ SOLN
320.0000 mg | INTRAVENOUS | Status: AC
  Administered 2017-08-01: 320 mg via INTRAVENOUS
  Filled 2017-07-31 (×2): qty 8

## 2017-07-31 NOTE — Telephone Encounter (Signed)
New message   Patient calling with concerns of afib. Please call  Patient c/o Palpitations:  High priority if patient c/o lightheadedness, shortness of breath, or chest pain  1) How long have you had palpitations/irregular HR/ Afib? Are you having the symptoms now? Palpitations started January 15  2) Are you currently experiencing lightheadedness, SOB or CP? SOB, lightheaded  3) Do you have a history of afib (atrial fibrillation) or irregular heart rhythm? YES  4) Have you checked your BP or HR? (document readings if available): 125/86 HR 84  5) Are you experiencing any other symptoms? Flutter feeling

## 2017-07-31 NOTE — ED Provider Notes (Signed)
[place] EMERGENCY DEPARTMENT Provider Note   CSN: [id number] Arrival date & time: 07/31/17  1052     History   Chief Complaint Chief Complaint  Patient presents with  . Shortness of Breath    HPI Renee [name] is a 72 y.o. female.  HPI 72 year old female history of AV nodal reentry tachycardia, prior ablation 1993, Chronic chest pain syndrome, long QT prolongation syndrome, has AICD, pending surgery tomorrow for chronic urinary incontinence with urology presents the emergency department with acute on chronic symptoms include palpitations/flutter, shortness of breath, lightheadedness, weakness with most recent episode this morning.  Patient denies any current chest pain/shortness of breath.  Patient's been compliant with her flecainide/diltiazem.  Patient is not currently on antiplatelet therapy secondary to upcoming procedure.  Patient called her cardiologist this morning was instructed to report to the emergency department for evaluation.    Past Medical History:  Diagnosis Date  . Anxiety   . AV nodal re-entry tachycardia [facility])    a. s/p RFA 1993- Dr. Rolland Porter, [place]  . Broken neck ([place])    1993  . Cancer (HCC)    hx of cervical   ca  . Chronic chest pain    a. 2005 Cath: Mild nonobstructive plaque (30-40% LCX);  b. 08/2011 low risk myoview;  c. 09/2011 Coronary CT: NL cors w ca score of 0.  . Chronic pain syndrome   . Complication of anesthesia    blood pressure and heart dropped in 2009    . Concussion    1979, 1993  . Degenerative joint disease    a. s/p L Total Knee Arthroplasty.  . Diet-controlled type 2 diabetes mellitus ([place])   . Diverticulosis of colon   . Duodenitis without hemorrhage   . Dysrhythmia    REPORTS , SINCE PLACEMENT OF PACEMAKER, STILL CAN FEEL HEART " GOING IN AND OUT OF [facility]" REPORTS, YESTERDAY HER BP SYSTOLIC WAS IN THE 803O AND WAS BREATHLESS, HR 112, SWEATING ; DENIES SYNCOPE  , DENIES CHEST PAIN   . Fibromyalgia   .  GERD (gastroesophageal reflux disease)   . Hemorrhoids   . Hypertension   . Irritable bowel syndrome   . Long QT interval    a. mild - advised to avoid meds that may prolong QT.  Marland Kitchen Neuropathy   . Osteoporosis   . Paroxysmal A-fib (HCC)    PER PATIENT. I CAN FEEL MY HEART RACING WHEN IM IN IT   . SVT (supraventricular tachycardia) (HCC)    a. nonsustained SVT - previously offered flecainide but refused;  b. 12/2012 30 day event monitor w/o significant arrhythmias.  . Thyroid nodule     Patient Active Problem List   Diagnosis Date Noted  . Hyperglycemia 01/31/2017  . Bilateral foot pain 01/31/2017  . Laryngopharyngeal reflux (LPR) 08/18/2016  . Cough 06/24/2016  . Acute upper respiratory infection 05/17/2016  . Syncope 03/03/2016  . Pacemaker 03/03/2016  . Chest pain at rest 09/13/2015  . Spinal stenosis of lumbar region 09/07/2015  . Raynaud phenomenon 09/07/2015  . Insomnia 09/07/2015  . Wellness examination 12/18/2014  . Paroxysmal spells 01/28/2014  . Dysthymia 01/28/2014  . Injury of leg 10/07/2013  . Headache(784.0) 03/14/2013  . Eye problem 02/15/2013  . Persistent headaches 02/15/2013  . Pre-syncope 10/04/2011  . NSVT (nonsustained ventricular tachycardia) ([place]) 10/04/2011  . DYSPNEA 10/01/2009  . Diverticulosis of large intestine 09/24/2009  . SWEATING 09/24/2009  . FATIQUE AND MALAISE 09/16/2009  . LONG QT SYNDROME 03/19/2009  .  AV NODAL REENTRY TACHYCARDIA 10/03/2008  . Other dysphagia 06/17/2008  . HEMORRHOIDS 06/16/2008  . DUODENITIS, WITHOUT HEMORRHAGE 06/16/2008  . CONSTIPATION, CHRONIC 06/16/2008  . THYROID NODULE, right lobe. 05/26/2008  . Chronic pain syndrome 05/26/2008  . Coronary atherosclerosis 05/26/2008  . BRONCHITIS, RECURRENT 05/26/2008  . Irritable bowel syndrome 05/26/2008  . FECAL INCONTINENCE 05/26/2008  . Anxiety state 03/24/2008  . Essential hypertension 03/24/2008  . GERD 03/24/2008  . DEGENERATIVE JOINT DISEASE 03/24/2008  . BACK  PAIN, LUMBAR 03/24/2008  . Fibromyalgia 03/24/2008  . Chest pain 03/24/2008  . NEUROPATHY, HX OF 03/24/2008    Past Surgical History:  Procedure Laterality Date  . ABLATION OF DYSRHYTHMIC FOCUS  1993  . BIOPSY THYROID    . CARDIAC ELECTROPHYSIOLOGY STUDY AND ABLATION     atrioventricular nodal reentant tachycardia  . CHOLECYSTECTOMY  1980  . COLONOSCOPY  06/25/2008   Diverticulosis and Hemorrhoids  . COLPOSCOPY    . CORONARY ANGIOPLASTY  2007  . EP IMPLANTABLE DEVICE N/A 03/03/2016   Procedure: Pacemaker Implant;  Surgeon: Evans Lance, MD;  Location: [facility];  Service: Cardiovascular;  Laterality: N/A;  . ESOPHAGOGASTRODUODENOSCOPY    . HYSTEROSCOPY  2002   with resection of endometrial polyps. by Dr.MCPhail  . HYSTEROSCOPY W/D&C N/A 02/05/2015   Procedure: DILATATION AND CURETTAGE /HYSTEROSCOPY;  Surgeon: Vanessa Kick, MD;  Location: [facility];  Service: Gynecology;  Laterality: N/A;  . LOOP RECORDER IMPLANT  08/21/2013   MDT LinQ implanted by Dr Lovena Le for syncope  . LOOP RECORDER IMPLANT N/A 08/21/2013   Procedure: LOOP RECORDER IMPLANT;  Surgeon: Evans Lance, MD;  Location: [facility] CATH LAB;  Service: Cardiovascular;  Laterality: N/A;  . ORIF RADIAL HEAD / NECK FRACTURE  09/05/88  . TILT TABLE STUDY N/A 05/15/2013   Procedure: TILT TABLE STUDY;  Surgeon: Deboraha Sprang, MD;  Location: [facility] CATH LAB;  Service: Cardiovascular;  Laterality: N/A;  . TOTAL HIP ARTHROPLASTY  10/10   Left, at [facility] History    Gravida Para Term Preterm AB Living   2 2       2    SAB TAB Ectopic Multiple Live Births                   Home Medications    Prior to Admission medications   Medication Sig Start Date End Date Taking? Authorizing Provider  cetirizine (ZYRTEC) 10 MG tablet Take 10 mg by mouth daily as needed for allergies.   Yes [provider]  nitroGLYCERIN (NITROSTAT) 0.4 MG SL tablet Place 1 tablet (0.4 mg total) under the tongue every 5 (five) minutes  as needed for chest pain. 02/03/15  Yes Biagio Borg, MD  ALPRAZolam Duanne Moron) 0.5 MG tablet Take 0.5-1 tablets (0.25-0.5 mg total) by mouth 2 (two) times daily as needed for anxiety. 02/04/15   Biagio Borg, MD  aspirin EC 81 MG tablet Take 81 mg by mouth every evening.     [provider]  diltiazem (CARDIZEM CD) 120 MG 24 hr capsule Take 1 capsule (120 mg total) by mouth every morning. 08/01/16   Evans Lance, MD  flecainide (TAMBOCOR) 100 MG tablet Take 1 tablet (100 mg total) by mouth 2 (two) times daily. 04/11/17   Larey Dresser, MD  furosemide (LASIX) 40 MG tablet Take 1 tablet (40 mg total) by mouth daily. Patient not taking: Reported on 07/18/2017 05/30/17   Evans Lance, MD  glucose blood (  ACCU-CHEK SMARTVIEW) test strip 1 each by Other route 2 (two) times daily. Use to check blood sugars twice a day Dx e11.9 Patient not taking: Reported on 07/31/2017 10/06/15   Biagio Borg, MD  Lancets (ACCU-CHEK MULTICLIX) lancets 1 each by Other route 2 (two) times daily. Use to help check blood sugars twice a day Dx e11.9 Patient not taking: Reported on 07/31/2017 10/06/15   Biagio Borg, MD  naproxen sodium (ALEVE) 220 MG tablet Take 440 mg by mouth 2 (two) times daily as needed (for arthritis pain.).     [provider]  omeprazole (PRILOSEC) 40 MG capsule Take 1 capsule (40 mg total) by mouth every evening. Patient taking differently: Take 40 mg by mouth every evening. (1700) 05/13/15   Ladene Artist, MD  potassium chloride (MICRO-K) 10 MEQ CR capsule TAKE 1 CAPSULE BY MOUTH  DAILY. 04/20/17   Biagio Borg, MD  sodium chloride (OCEAN) 0.65 % SOLN nasal spray Place 1-2 sprays into both nostrils 4 (four) times daily as needed for congestion.    [provider]  triamterene-hydrochlorothiazide (DYAZIDE) 37.5-25 MG capsule Take 1 capsule by mouth daily. 06/29/17   [provider]  zolpidem (AMBIEN) 5 MG tablet Take 0.5-1 tablets (2.5-5 mg total) by mouth at  bedtime as needed for sleep. 01/27/17   Biagio Borg, MD    Family History Family History  Problem Relation Age of Onset  . Ovarian cancer Mother   . Stroke Mother   . Diabetes Mother   . Asthma Mother   . Lung cancer Father        Heart problems  . Heart disease Sister   . Diabetes Brother   . Diabetes Sister        Pacemaker, CHF  . Heart disease Sister   . Asthma Sister     Social History Social History   Tobacco Use  . Smoking status: Never Smoker  . Smokeless tobacco: Never Used  Substance Use Topics  . Alcohol use: Yes    Comment: Occasional   . Drug use: No     Allergies   Cyclobenzaprine; Azithromycin; Adhesive [tape]; Moxifloxacin; and Quinolones   Review of Systems Review of Systems  Review of Systems  Constitutional: Negative for fever and chills.  HENT: Negative for ear pain, sore throat and trouble swallowing.   Eyes: Negative for pain and visual disturbance.  Respiratory: see HPI Cardiovascular: Negative for chest pain and leg swelling.  Gastrointestinal: Negative for nausea, vomiting, abdominal pain and diarrhea.  Genitourinary: Negative for dysuria, urgency and frequency.  Musculoskeletal: Negative for back pain and joint swelling.  Skin: Negative for rash and wound.  Neurological: see HPI   Physical Exam Updated Vital Signs BP (!) 148/93   Pulse 60   Temp 98.1 F (36.7 C) (Oral)   Resp (!) 26   Wt 73.9 kg (163 lb)   SpO2 100%   BMI 28.87 kg/m   Physical Exam  Physical Exam Vitals:   07/31/17 1815 07/31/17 1830  BP: (!) 142/84 (!) 148/93  Pulse: 70 60  Resp: 15 (!) 26  Temp:    SpO2: 97% 100%   Constitutional: Patient is in no acute distress Head: Normocephalic and atraumatic.  Eyes: Extraocular motion intact, no scleral icterus Neck: Supple without meningismus, mass, or overt JVD Respiratory: Effort normal and breath sounds normal. No respiratory distress. CV: Heart regular rate and rhythm, no obvious murmurs.  Pulses  +2 and symmetric Abdomen: Soft, non-tender, non-distended  MSK: Extremities are atraumatic without deformity, ROM intact Skin: Warm, dry, intact Neuro: Alert and oriented, no motor deficit noted Psychiatric: Mood and affect are normal.  ED Treatments / Results  Labs (all labs ordered are listed, but only abnormal results are displayed) Labs Reviewed  CBC WITH DIFFERENTIAL/PLATELET - Abnormal; Notable for the following components:      Result Value   RBC 5.21 (*)    All other components within normal limits  BASIC METABOLIC PANEL - Abnormal; Notable for the following components:   Potassium 3.4 (*)    All other components within normal limits  BRAIN NATRIURETIC PEPTIDE  I-STAT TROPONIN, ED  I-STAT TROPONIN, ED    EKG  EKG Interpretation  Date/Time:  Monday July 31 2017 11:12:34 EST Ventricular Rate:  72 PR Interval:  212 QRS Duration: 94 QT Interval:  436 QTC Calculation: 477 R Axis:   64 Text Interpretation:  Atrial-paced rhythm with prolonged AV conduction Low voltage QRS Cannot rule out Inferior infarct , age undetermined Abnormal ECG no significant change since Oct 2018 Confirmed by Sherwood Gambler [phone]) on 07/31/2017 3:19:04 PM       Radiology Dg Chest 2 View  Result Date: 07/31/2017 CLINICAL DATA:  Shortness of breath with palpitations. EXAM: CHEST  2 VIEW COMPARISON:  Chest x-ray dated October 09, 2016. FINDINGS: Unchanged left chest wall AICD. The heart size and mediastinal contours are within normal limits. Normal pulmonary vascularity. No focal consolidation, pleural effusion, or pneumothorax. No acute osseous abnormality. IMPRESSION: No active cardiopulmonary disease. Electronically Signed   By: Titus Dubin M.D.   On: 07/31/2017 11:54    Procedures Procedures (including critical care time)  Medications Ordered in ED Medications - No data to display   Initial Impression / Assessment and Plan / ED Course  I have reviewed the triage vital signs and the  nursing notes.  Pertinent labs & imaging results that were available during my care of the patient were reviewed by me and considered in my medical decision making (see chart for details).     72 year old female history of AV nodal reentry tachycardia, prior ablation 1993, Chronic chest pain syndrome, long QT prolongation syndrome, has AICD, pending surgery tomorrow for chronic urinary incontinence with urology presents the emergency department with acute on chronic symptoms include palpitations/flutter, shortness of breath, lightheadedness, weakness with most recent episode this morning.  Patient denies any current chest pain/shortness of breath.  Patient's been compliant with her flecainide/diltiazem.  Patient is not currently on antiplatelet therapy secondary to upcoming procedure.  Patient called her cardiologist this morning was instructed to report to the emergency department for evaluation.   Patient arrives hemodynamic [place] stable normotensive and physical exam unremarkable.  Review of labs shows no evidence of leukocytosis, stable H&H, mild hypokalemia potassium 3.4, good renal function, troponin x1 undetectable,BNP at 54.7 and chest x-ray with no findings concerning for infectious etiology/heart failure.  Patient has been asymptomatic throughout the emergency department encounter.  Report from [employer] interrogator within the emergency department states there is normal rhythmic variability no capture from concerning pathological rhythms.  Spoke with patient she feels reassured after finding out the information with her pacemaker, has been asymptomatic throughout the emergency department encounter.  Patient has made a personal decision to move forward with her scheduled surgery tomorrow and will follow up with her cardiologist thereafter.  Doubt ACS, doubt infectious etiology; doubt pathologic arrhythmia; doubt PNA; low suspicion PE.      Final Clinical Impressions(s) / ED Diagnoses  Final diagnoses:  Palpitations    ED Discharge Orders    None       Willette Alma, DO 07/31/17 1903    Sherwood Gambler, MD 08/01/17 0001

## 2017-07-31 NOTE — H&P (Signed)
I saw the patient in November 2016 and she was going to see Dr. Kaleen Odea for a pessary. I felt a 80% to 90% of the problem was an overactive bladder. She had a cardiac history. I felt that the prolapse surgery would be a lot for her mixed incontinence. She did not come back on myrbetriq. She has a history of QT prolongation.   The patient currently is mixed incontinence. She is mild or enuresis. She had perforation of her uterus during a biopsy for cervical issues. She may be wanted to have a hysterectomy and I think is hoping to have her bladder fixed simultaneously. She now has a pacemaker for her cardiac issues   On pelvic examination the patient's cervix did descend from 9 cm to about 5 cm. She had a small grade 3 cystocele with a small central defect and a shorter anterior vaginal wall. She had a grade 2 rectocele. She did not leak with coughing   She is hoping to see I believe another gynecologist. I want to see her back on Toviaz to see if it helps her mixed incontinence. She does have surgery she likely best benefit from a transvaginal hysterectomy with vault suspension cystocele repair and graft and probable rectocele repair. I will need to go through all my notes in more detail. If she responds well to an overactive bladder medication she certainly would not need a sling.   Today  The patient did not want to take medication as noted. In the past she did have urodynamics which showed mild stress incontinence noted. She did very overactive bladder. She has had issues and concerns about medications with her cardiac issue. She had similar pelvic floor findings in the past.   The patient currently leaks with coughing and sneezing a small amount. She has urge incontinence and large amount with little warning. She has high-volume bedwetting. She double pads and uses 6 or 8 changes a day that are very wet. Her fecal incontinence varies with loose bowel movements and irritable bowel syndrome and sometimes  is associated with short onset urgency. I think this is been addressed before medically by others. She has not 6 reactive. She is seeking a second opinion by Dr. Philis Pique   I drew her a picture. We talked about a transvaginal vault suspension cystocele repair and graft and probable or possible rectocele repair. We talked about watchful waiting a pessary. Hysterectomy will be discussed by gynecology. She she understands have surgery for prolapse and not for incontinence.   I talked about watchful waiting and future medications approved by cardiology with a pacemaker and neuromodulation treatments were mentioned and we talked about a sling in detail for her mixed incontinence. I'm very concerned that her overactive bladder and bedwetting etc will persist. She says she can live with the incontinence. Full discussion a sling and mesh and graft issues discussed. She understands not to have surgery for GI symptoms and bowel complaints that will continue postoperatively.   I believe she listened very well and understands the complexity of the issue. Her daughter was very helpful as well. I had to firmly and politely redirect her a few times so we could walk through her discussions today with treatment goals.   If she does proceed with surgery I will hear from Dr. Philis Pique and I will send her copy of today's note   I think the patient made an excellent decision not to have a sling. We can always try cardiac cleared drugs in the  future and we can discuss the 3 neuromodulation treatments in the future.   Today  The patient's prolapse is stable and apparently she has surgery scheduled with myself on February 19. Her bladder is more frequent than ever. She has very little warning with urge incontinence. She still has bedwetting.   She reminded me she had 2 hips done and is afraid that she may have less mobility for the pelvic surgery and that she could have a lot of back pain afterwards and this was highlighted.    Pelvic examination unchanged.   The patient will be proceeding with prolapse surgery and not a sling. I will call the urine cultures positive ongoing incontinence discussed. The patient remembered our discussions well     ALLERGIES: Avelox TABS    MEDICATIONS: Omeprazole 40 mg capsule,delayed release  Alprazolam  Cartia Xt  Flecainide Acetate  Nitrostat 0.4 mg tablet, sublingual  Potassium Chloride 10 meq capsule, extended release  Triamterene-Hydrochlorothiazid 37.5 mg-25 mg capsule     GU PSH: None     PSH Notes: Neck Surgery, Wrist Surgery, Gallbladder Surgery, Hip Surgery   NON-GU PSH: Pacemaker placement    GU PMH: Cystocele, midline, Cystocele, midline - 04/16/2015 Female Infertility, Unspec, Female infertility - 04/16/2015 Mixed incontinence, Urge and stress incontinence - 04/16/2015 Nocturia, Nocturia - 04/16/2015 Rectocele, Rectocele, female - 04/16/2015 Urinary Frequency, Increased urinary frequency - 04/16/2015    NON-GU PMH: Encounter for general adult medical examination without abnormal findings, Encounter for preventive health examination - 04/30/2015 Anxiety, Anxiety - 04/16/2015 Personal history of other diseases of the circulatory system, History of hypertension - 04/16/2015, History of cardiac arrhythmia, - 04/16/2015 Personal history of other diseases of the digestive system, History of esophageal reflux - 04/16/2015 Personal history of other diseases of the musculoskeletal system and connective tissue, History of gout - 04/16/2015, History of arthritis, - 04/16/2015    FAMILY HISTORY: renal failure - Runs In Family   SOCIAL HISTORY: None    Notes: Never smoker, Father deceased, Mother deceased, Alcohol use, Non-smoker   REVIEW OF SYSTEMS:    GU Review Female:   Patient denies frequent urination, hard to postpone urination, burning /pain with urination, get up at night to urinate, leakage of urine, stream starts and stops, trouble starting your stream, have to  strain to urinate, and being pregnant.  Gastrointestinal (Upper):   Patient denies nausea, vomiting, and indigestion/ heartburn.  Gastrointestinal (Lower):   Patient denies diarrhea and constipation.  Constitutional:   Patient denies fever, night sweats, weight loss, and fatigue.  Skin:   Patient denies skin rash/ lesion and itching.  Eyes:   Patient denies blurred vision and double vision.  Ears/ Nose/ Throat:   Patient denies sore throat and sinus problems.  Hematologic/Lymphatic:   Patient denies swollen glands and easy bruising.  Cardiovascular:   Patient denies leg swelling and chest pains.  Respiratory:   Patient denies cough and shortness of breath.  Endocrine:   Patient denies excessive thirst.  Musculoskeletal:   Patient denies joint pain and back pain.  Neurological:   Patient denies headaches and dizziness.  Psychologic:   Patient denies depression and anxiety.   VITAL SIGNS:      07/18/2017 02:21 PM  BP 135/75 mmHg  Pulse 81 /min  Temperature 97.8 F / 36.5 C   PAST DATA REVIEWED:  Source Of History:  Patient   PROCEDURES:          Urinalysis w/Scope Dipstick Dipstick Cont'd Micro  Color: Yellow Bilirubin:  Neg WBC/hpf: 0 - 5/hpf  Appearance: Clear Ketones: Neg RBC/hpf: 0 - 2/hpf  Specific Gravity: 1.015 Blood: Neg Bacteria: NS (Not Seen)  pH: 6.5 Protein: Neg Cystals: NS (Not Seen)  Glucose: Neg Urobilinogen: 0.2 Casts: NS (Not Seen)    Nitrites: Neg Trichomonas: Not Present    Leukocyte Esterase: Trace Mucous: Not Present      Epithelial Cells: NS (Not Seen)      Yeast: NS (Not Seen)      Sperm: Not Present    ASSESSMENT:      ICD-10 Details  1 GU:   Cystocele, midline - N81.11   2   Mixed incontinence - N39.46 Stable     PLAN:           Orders Labs Urine Culture   I drew her a picture and we talked about prolapse surgery in detail. Pros, cons, general surgical and anesthetic risks, and other options including behavioral therapy, pessaries, and watchful  waiting were discussed. She understands that prolapse repairs are successful in 80-85% of cases for prolapse symptoms and can recur anteriorly, posteriorly, and/or apically. She understands that in most cases I use a graft and general risks were discussed. Surgical risks were described but not limited to the discussion of injury to neighboring structures including the bowel (with possible life-threatening sepsis and colostomy), bladder, urethra, vagina (all resulting in further surgery), and ureter (resulting in re-implantation). We talked about injury to nerves/soft tissue leading to debilitating and intractable pelvic, abdominal, and lower extremity pain syndromes and neuropathies. The risks of buttock pain, intractable dyspareunia, and vaginal narrowing and shortening with sequelae were discussed. Bleeding risks, transfusion rates, and infection were discussed. The risk of persistent, de novo, or worsening bladder and/or bowel incontinence/dysfunction was discussed. The need for CIC was described as well the usual post-operative course. The patient understands that she might not reach her treatment goal and that she might be worse following surgery.   We talked about a sling in detail. Pros, cons, general surgical and anesthetic risks, and other options including behavioral therapy and watchful waiting were discussed. She understands that slings are generally successful in 90% of cases for stress incontinence, 50% for urge incontinence, and that in a small % of cases the incontinence can worsen. The risk of persistent, de novo, or worsening incontinence/dysfunction was discussed. Risks were described but not limited to the discussion of injury to neighboring structures including the bowel (with possible life-threatening sepsis and colostomy), bladder, urethra, vagina (all resulting in further surgery), and ureter (resulting in re-implantation). We also talked about the risk of retention requiring urethrolysis,  extrusion requiring revision, and erosion resulting in further surgery. Bleeding risks and transfusion rates and the risk of infection were discussed. The risk of pelvic and abdominal pain syndromes, dyspareunia, and neuropathies were discussed. The need for CIC was described as well the usual post-operative course. The patient understands that she might not reach her treatment goal and that she might be worse following surgery.   The patient likely was dilated years ago based upon her past history.   After a thorough review of the management options for the patient's condition the patient  elected to proceed with surgical therapy as noted above. We have discussed the potential benefits and risks of the procedure, side effects of the proposed treatment, the likelihood of the patient achieving the goals of the procedure, and any potential problems that might occur during the procedure or recuperation. Informed consent has been obtained.  Schedule Return Visit/Planned Activity: Return PRN - Office Visit  Return Notes: we will see her on the day of surgery

## 2017-07-31 NOTE — Telephone Encounter (Signed)
Pt advised to go to ER by heart failure team.  No further action at this time.

## 2017-07-31 NOTE — ED Triage Notes (Addendum)
Pt presents to the ed with complaints of shortness of breath x 1 month.  Pt denies any pain.  NAD in triage. Pt also endorses on and off palpitations through out the past month, denies any at this time.

## 2017-07-31 NOTE — Telephone Encounter (Signed)
Patient called stating that since Jan 15th she has been having episodes of Afib with HR jumping up to 114.  This morning her BP started at 203/90 with HR 114.  Most recent was 131/81 with HR 111.  She feels very short of breath and that was apparent during our conversation that she was SOB.  She denies chest pain.    Per Dr. Aundra Dubin if patient is feeling that bad she needs to go to the ER.  I explained that to patient and she is agreeable to go.

## 2017-08-01 ENCOUNTER — Inpatient Hospital Stay (HOSPITAL_COMMUNITY): Payer: Medicare Other | Admitting: Certified Registered"

## 2017-08-01 ENCOUNTER — Encounter (HOSPITAL_COMMUNITY): Payer: Self-pay

## 2017-08-01 ENCOUNTER — Observation Stay (HOSPITAL_COMMUNITY)
Admission: RE | Admit: 2017-08-01 | Discharge: 2017-08-02 | Disposition: A | Discharge status: Home / Self Care | Payer: Medicare Other | Source: Ambulatory Visit | Attending: Urology | Admitting: Urology

## 2017-08-01 ENCOUNTER — Encounter (HOSPITAL_COMMUNITY)
Admission: RE | Disposition: A | Discharge status: Home / Self Care | Payer: Self-pay | Source: Ambulatory Visit | Attending: Urology

## 2017-08-01 DIAGNOSIS — I2510 Atherosclerotic heart disease of native coronary artery without angina pectoris: Secondary | ICD-10-CM | POA: Diagnosis not present

## 2017-08-01 DIAGNOSIS — F419 Anxiety disorder, unspecified: Secondary | ICD-10-CM | POA: Insufficient documentation

## 2017-08-01 DIAGNOSIS — Z950 Presence of cardiac pacemaker: Secondary | ICD-10-CM | POA: Diagnosis not present

## 2017-08-01 DIAGNOSIS — Z96652 Presence of left artificial knee joint: Secondary | ICD-10-CM | POA: Insufficient documentation

## 2017-08-01 DIAGNOSIS — N816 Rectocele: Secondary | ICD-10-CM | POA: Diagnosis not present

## 2017-08-01 DIAGNOSIS — K219 Gastro-esophageal reflux disease without esophagitis: Secondary | ICD-10-CM | POA: Insufficient documentation

## 2017-08-01 DIAGNOSIS — N8111 Cystocele, midline: Secondary | ICD-10-CM | POA: Diagnosis present

## 2017-08-01 DIAGNOSIS — I10 Essential (primary) hypertension: Secondary | ICD-10-CM | POA: Insufficient documentation

## 2017-08-01 DIAGNOSIS — Z79899 Other long term (current) drug therapy: Secondary | ICD-10-CM | POA: Insufficient documentation

## 2017-08-01 DIAGNOSIS — Z7982 Long term (current) use of aspirin: Secondary | ICD-10-CM | POA: Diagnosis not present

## 2017-08-01 DIAGNOSIS — N8110 Cystocele, unspecified: Secondary | ICD-10-CM | POA: Diagnosis not present

## 2017-08-01 DIAGNOSIS — N3946 Mixed incontinence: Secondary | ICD-10-CM | POA: Insufficient documentation

## 2017-08-01 DIAGNOSIS — F329 Major depressive disorder, single episode, unspecified: Secondary | ICD-10-CM | POA: Insufficient documentation

## 2017-08-01 DIAGNOSIS — E1140 Type 2 diabetes mellitus with diabetic neuropathy, unspecified: Secondary | ICD-10-CM | POA: Insufficient documentation

## 2017-08-01 DIAGNOSIS — Z8541 Personal history of malignant neoplasm of cervix uteri: Secondary | ICD-10-CM | POA: Insufficient documentation

## 2017-08-01 HISTORY — PX: VAGINAL HYSTERECTOMY: SHX2639

## 2017-08-01 HISTORY — PX: CYSTOSCOPY: SHX5120

## 2017-08-01 HISTORY — PX: ANTERIOR AND POSTERIOR REPAIR: SHX5121

## 2017-08-01 HISTORY — PX: BILATERAL SALPINGECTOMY: SHX5743

## 2017-08-01 LAB — GLUCOSE, CAPILLARY
GLUCOSE-CAPILLARY: 132 mg/dL — AB (ref 65–99)
GLUCOSE-CAPILLARY: 166 mg/dL — AB (ref 65–99)

## 2017-08-01 LAB — TYPE AND SCREEN
ABO/RH(D): A POS
ANTIBODY SCREEN: NEGATIVE

## 2017-08-01 LAB — HEMOGLOBIN AND HEMATOCRIT, BLOOD
HCT: 36.6 % (ref 36.0–46.0)
Hemoglobin: 12.2 g/dL (ref 12.0–15.0)

## 2017-08-01 SURGERY — HYSTERECTOMY, VAGINAL
Anesthesia: Choice

## 2017-08-01 SURGERY — ANTERIOR (CYSTOCELE) AND POSTERIOR REPAIR (RECTOCELE)
Anesthesia: General

## 2017-08-01 MED ORDER — SODIUM CHLORIDE 0.9 % IR SOLN
Status: DC | PRN
Start: 2017-08-01 — End: 2017-08-01
  Administered 2017-08-01: 1000 mL

## 2017-08-01 MED ORDER — CLINDAMYCIN PHOSPHATE 2 % VA CREA
TOPICAL_CREAM | VAGINAL | Status: DC | PRN
  Administered 2017-08-01: 1 via VAGINAL

## 2017-08-01 MED ORDER — ESTRADIOL 0.1 MG/GM VA CREA
TOPICAL_CREAM | VAGINAL | Status: AC
  Filled 2017-08-01: qty 85

## 2017-08-01 MED ORDER — PHENYLEPHRINE 40 MCG/ML (10ML) SYRINGE FOR IV PUSH (FOR BLOOD PRESSURE SUPPORT)
PREFILLED_SYRINGE | INTRAVENOUS | Status: DC | PRN
  Administered 2017-08-01 (×2): 80 ug via INTRAVENOUS
  Administered 2017-08-01: 40 ug via INTRAVENOUS
  Administered 2017-08-01: 80 ug via INTRAVENOUS

## 2017-08-01 MED ORDER — MIDAZOLAM HCL 2 MG/2ML IJ SOLN
INTRAMUSCULAR | Status: DC | PRN
  Administered 2017-08-01: 2 mg via INTRAVENOUS

## 2017-08-01 MED ORDER — FENTANYL CITRATE (PF) 100 MCG/2ML IJ SOLN
25.0000 ug | INTRAMUSCULAR | Status: DC | PRN
  Administered 2017-08-01 (×2): 50 ug via INTRAVENOUS

## 2017-08-01 MED ORDER — ONDANSETRON HCL 4 MG/2ML IJ SOLN
INTRAMUSCULAR | Status: AC
  Filled 2017-08-01: qty 2

## 2017-08-01 MED ORDER — FLUORESCEIN SODIUM 10 % IV SOLN
INTRAVENOUS | Status: AC
  Filled 2017-08-01: qty 5

## 2017-08-01 MED ORDER — DEXAMETHASONE SODIUM PHOSPHATE 10 MG/ML IJ SOLN
INTRAMUSCULAR | Status: AC
  Filled 2017-08-01: qty 1

## 2017-08-01 MED ORDER — MEPERIDINE HCL 50 MG/ML IJ SOLN: 6.2500 mg | INTRAMUSCULAR | Status: DC | PRN

## 2017-08-01 MED ORDER — CEFAZOLIN SODIUM-DEXTROSE 2-4 GM/100ML-% IV SOLN
INTRAVENOUS | Status: AC
  Filled 2017-08-01: qty 100

## 2017-08-01 MED ORDER — PANTOPRAZOLE SODIUM 40 MG PO TBEC
40.0000 mg | DELAYED_RELEASE_TABLET | Freq: Every day | ORAL | Status: DC
  Administered 2017-08-01 – 2017-08-02 (×2): 40 mg via ORAL
  Filled 2017-08-01 (×2): qty 1

## 2017-08-01 MED ORDER — ONDANSETRON HCL 4 MG/2ML IJ SOLN
INTRAMUSCULAR | Status: DC | PRN
  Administered 2017-08-01: 4 mg via INTRAVENOUS

## 2017-08-01 MED ORDER — LIDOCAINE 2% (20 MG/ML) 5 ML SYRINGE
INTRAMUSCULAR | Status: DC | PRN
  Administered 2017-08-01: 100 mg via INTRAVENOUS

## 2017-08-01 MED ORDER — FLECAINIDE ACETATE 100 MG PO TABS
100.0000 mg | ORAL_TABLET | Freq: Two times a day (BID) | ORAL | Status: DC
  Administered 2017-08-01 – 2017-08-02 (×2): 100 mg via ORAL
  Filled 2017-08-01 (×2): qty 1

## 2017-08-01 MED ORDER — PROMETHAZINE HCL 25 MG/ML IJ SOLN
12.5000 mg | Freq: Four times a day (QID) | INTRAMUSCULAR | Status: DC | PRN
  Administered 2017-08-01: 12.5 mg via INTRAVENOUS
  Filled 2017-08-01: qty 1

## 2017-08-01 MED ORDER — DEXAMETHASONE SODIUM PHOSPHATE 10 MG/ML IJ SOLN
INTRAMUSCULAR | Status: DC | PRN
  Administered 2017-08-01: 10 mg via INTRAVENOUS

## 2017-08-01 MED ORDER — SUGAMMADEX SODIUM 200 MG/2ML IV SOLN
INTRAVENOUS | Status: DC | PRN
  Administered 2017-08-01: 150 mg via INTRAVENOUS

## 2017-08-01 MED ORDER — FLUORESCEIN SODIUM 10 % IV SOLN
INTRAVENOUS | Status: DC | PRN
  Administered 2017-08-01: 50 mg via INTRAVENOUS

## 2017-08-01 MED ORDER — ROCURONIUM BROMIDE 10 MG/ML (PF) SYRINGE
PREFILLED_SYRINGE | INTRAVENOUS | Status: AC
  Filled 2017-08-01: qty 5

## 2017-08-01 MED ORDER — SODIUM CHLORIDE 0.45 % IV SOLN
INTRAVENOUS | Status: DC
  Administered 2017-08-01 – 2017-08-02 (×2): via INTRAVENOUS

## 2017-08-01 MED ORDER — STERILE WATER FOR IRRIGATION IR SOLN
Status: DC | PRN
  Administered 2017-08-01: 1000 mL

## 2017-08-01 MED ORDER — FENTANYL CITRATE (PF) 100 MCG/2ML IJ SOLN
INTRAMUSCULAR | Status: AC
  Filled 2017-08-01: qty 2

## 2017-08-01 MED ORDER — INSULIN ASPART 100 UNIT/ML ~~LOC~~ SOLN
0.0000 [IU] | Freq: Three times a day (TID) | SUBCUTANEOUS | Status: DC
  Administered 2017-08-01: 2 [IU] via SUBCUTANEOUS

## 2017-08-01 MED ORDER — LACTATED RINGERS IV SOLN
INTRAVENOUS | Status: DC
  Administered 2017-08-01 (×3): via INTRAVENOUS

## 2017-08-01 MED ORDER — LIP MEDEX EX OINT
TOPICAL_OINTMENT | CUTANEOUS | Status: AC
Start: 2017-08-01 — End: 2017-08-02
  Filled 2017-08-01: qty 7

## 2017-08-01 MED ORDER — CLINDAMYCIN PHOSPHATE 2 % VA CREA
TOPICAL_CREAM | VAGINAL | Status: AC
  Filled 2017-08-01: qty 40

## 2017-08-01 MED ORDER — MIDAZOLAM HCL 2 MG/2ML IJ SOLN
INTRAMUSCULAR | Status: AC
  Filled 2017-08-01: qty 2

## 2017-08-01 MED ORDER — FENTANYL CITRATE (PF) 250 MCG/5ML IJ SOLN
INTRAMUSCULAR | Status: AC
  Filled 2017-08-01: qty 5

## 2017-08-01 MED ORDER — FUROSEMIDE 10 MG/ML IJ SOLN
5.0000 mg | Freq: Once | INTRAMUSCULAR | Status: AC
  Administered 2017-08-01: 5 mg via INTRAVENOUS
  Filled 2017-08-01: qty 2

## 2017-08-01 MED ORDER — FLEET ENEMA 7-19 GM/118ML RE ENEM
1.0000 | ENEMA | Freq: Once | RECTAL | Status: AC
  Administered 2017-08-01: 1 via RECTAL
  Filled 2017-08-01: qty 1

## 2017-08-01 MED ORDER — SODIUM CHLORIDE 0.9 % IV SOLN
INTRAVENOUS | Status: DC | PRN
  Administered 2017-08-01: 500 mL

## 2017-08-01 MED ORDER — LIDOCAINE-EPINEPHRINE (PF) 1 %-1:200000 IJ SOLN
INTRAMUSCULAR | Status: DC | PRN
  Administered 2017-08-01: 53 mL

## 2017-08-01 MED ORDER — SODIUM CHLORIDE 0.9 % IV SOLN
INTRAVENOUS | Status: AC
  Filled 2017-08-01: qty 500000

## 2017-08-01 MED ORDER — PROPOFOL 10 MG/ML IV BOLUS
INTRAVENOUS | Status: DC | PRN
  Administered 2017-08-01: 30 mg via INTRAVENOUS
  Administered 2017-08-01: 130 mg via INTRAVENOUS

## 2017-08-01 MED ORDER — NITROGLYCERIN 0.4 MG SL SUBL: 0.4000 mg | SUBLINGUAL_TABLET | SUBLINGUAL | Status: DC | PRN

## 2017-08-01 MED ORDER — MORPHINE SULFATE (PF) 4 MG/ML IV SOLN
INTRAVENOUS | Status: AC
  Filled 2017-08-01: qty 1

## 2017-08-01 MED ORDER — MORPHINE SULFATE (PF) 4 MG/ML IV SOLN
2.0000 mg | INTRAVENOUS | Status: DC | PRN
  Administered 2017-08-01 – 2017-08-02 (×3): 2 mg via INTRAVENOUS
  Filled 2017-08-01 (×2): qty 1

## 2017-08-01 MED ORDER — PROPOFOL 10 MG/ML IV BOLUS
INTRAVENOUS | Status: AC
  Filled 2017-08-01: qty 20

## 2017-08-01 MED ORDER — ACETAMINOPHEN 325 MG PO TABS: 650.0000 mg | ORAL_TABLET | ORAL | Status: DC | PRN

## 2017-08-01 MED ORDER — METOCLOPRAMIDE HCL 5 MG/ML IJ SOLN: 10.0000 mg | Freq: Once | INTRAMUSCULAR | Status: DC | PRN

## 2017-08-01 MED ORDER — LIDOCAINE HCL 2 % EX GEL
CUTANEOUS | Status: AC
  Filled 2017-08-01: qty 5

## 2017-08-01 MED ORDER — INDIGOTINDISULFONATE SODIUM 8 MG/ML IJ SOLN
INTRAMUSCULAR | Status: AC
  Filled 2017-08-01: qty 5

## 2017-08-01 MED ORDER — FUROSEMIDE 10 MG/ML IJ SOLN: INTRAMUSCULAR | Status: DC | PRN

## 2017-08-01 MED ORDER — ONDANSETRON HCL 4 MG/2ML IJ SOLN
4.0000 mg | INTRAMUSCULAR | Status: DC | PRN
  Administered 2017-08-01: 4 mg via INTRAVENOUS

## 2017-08-01 MED ORDER — DEXTROSE 5 % IV SOLN
INTRAVENOUS | Status: DC | PRN
  Administered 2017-08-01: 25 ug/min via INTRAVENOUS

## 2017-08-01 MED ORDER — HYDROCODONE-ACETAMINOPHEN 5-325 MG PO TABS: 1.0000 | ORAL_TABLET | ORAL | Status: DC | PRN

## 2017-08-01 MED ORDER — FENTANYL CITRATE (PF) 250 MCG/5ML IJ SOLN
INTRAMUSCULAR | Status: DC | PRN
  Administered 2017-08-01: 100 ug via INTRAVENOUS
  Administered 2017-08-01: 50 ug via INTRAVENOUS
  Administered 2017-08-01 (×4): 25 ug via INTRAVENOUS

## 2017-08-01 MED ORDER — HYDROCODONE-ACETAMINOPHEN 5-325 MG PO TABS: 1.0000 | ORAL_TABLET | Freq: Four times a day (QID) | ORAL | 0 refills | Status: DC | PRN

## 2017-08-01 MED ORDER — DIPHENHYDRAMINE HCL 50 MG/ML IJ SOLN: 12.5000 mg | Freq: Four times a day (QID) | INTRAMUSCULAR | Status: DC | PRN

## 2017-08-01 MED ORDER — STERILE WATER FOR IRRIGATION IR SOLN
Status: DC | PRN
  Administered 2017-08-01: 3000 mL

## 2017-08-01 MED ORDER — LIDOCAINE-EPINEPHRINE (PF) 1 %-1:200000 IJ SOLN
INTRAMUSCULAR | Status: AC
  Filled 2017-08-01: qty 60

## 2017-08-01 MED ORDER — CEFAZOLIN SODIUM-DEXTROSE 2-4 GM/100ML-% IV SOLN
2.0000 g | INTRAVENOUS | Status: AC
  Administered 2017-08-01 (×2): 2 g via INTRAVENOUS
  Filled 2017-08-01: qty 100

## 2017-08-01 MED ORDER — PHENYLEPHRINE 40 MCG/ML (10ML) SYRINGE FOR IV PUSH (FOR BLOOD PRESSURE SUPPORT)
PREFILLED_SYRINGE | INTRAVENOUS | Status: AC
  Filled 2017-08-01: qty 10

## 2017-08-01 MED ORDER — DILTIAZEM HCL ER COATED BEADS 120 MG PO CP24
120.0000 mg | ORAL_CAPSULE | Freq: Every day | ORAL | Status: DC
  Administered 2017-08-02: 120 mg via ORAL
  Filled 2017-08-01: qty 1

## 2017-08-01 MED ORDER — ROCURONIUM BROMIDE 10 MG/ML (PF) SYRINGE
PREFILLED_SYRINGE | INTRAVENOUS | Status: DC | PRN
  Administered 2017-08-01 (×4): 10 mg via INTRAVENOUS
  Administered 2017-08-01: 60 mg via INTRAVENOUS

## 2017-08-01 MED ORDER — TRIAMTERENE-HCTZ 37.5-25 MG PO CAPS
1.0000 | ORAL_CAPSULE | Freq: Every day | ORAL | Status: DC
  Administered 2017-08-01 – 2017-08-02 (×2): 1 via ORAL
  Filled 2017-08-01 (×2): qty 1

## 2017-08-01 MED ORDER — DIPHENHYDRAMINE HCL 12.5 MG/5ML PO ELIX: 12.5000 mg | ORAL_SOLUTION | Freq: Four times a day (QID) | ORAL | Status: DC | PRN

## 2017-08-01 MED ORDER — POTASSIUM CHLORIDE CRYS ER 10 MEQ PO TBCR
10.0000 meq | EXTENDED_RELEASE_TABLET | Freq: Once | ORAL | Status: AC
  Administered 2017-08-01: 10 meq via ORAL
  Filled 2017-08-01: qty 1

## 2017-08-01 SURGICAL SUPPLY — 72 items
ALLOGRAFT TUTOPLAST AXIS 6X12 (Tissue) ×2 IMPLANT
BAG URINE DRAINAGE (UROLOGICAL SUPPLIES) ×3 IMPLANT
BAG URO CATCHER STRL LF (MISCELLANEOUS) ×2 IMPLANT
BALLN NEPHROSTOMY (BALLOONS)
BALLOON NEPHROSTOMY (BALLOONS) IMPLANT
BLADE SURG 15 STRL LF DISP TIS (BLADE) ×3 IMPLANT
BLADE SURG 15 STRL SS (BLADE) ×5
CATH FOLEY 2W COUNCIL 5CC 18FR (CATHETERS) IMPLANT
CATH FOLEY 2WAY SLVR  5CC 14FR (CATHETERS) ×2
CATH FOLEY 2WAY SLVR 5CC 14FR (CATHETERS) ×3 IMPLANT
CLOTH BEACON ORANGE TIMEOUT ST (SAFETY) ×4 IMPLANT
COVER FOOTSWITCH UNIV (MISCELLANEOUS) IMPLANT
COVER MAYO STAND STRL (DRAPES) IMPLANT
COVER SURGICAL LIGHT HANDLE (MISCELLANEOUS) ×7 IMPLANT
DECANTER SPIKE VIAL GLASS SM (MISCELLANEOUS) ×5 IMPLANT
DEVICE CAPIO SLIM SINGLE (INSTRUMENTS) ×5 IMPLANT
DRAIN PENROSE 18X1/4 LTX STRL (WOUND CARE) ×5 IMPLANT
DRAPE SHEET LG 3/4 BI-LAMINATE (DRAPES) ×5 IMPLANT
DRAPE STERI URO 9X17 APER PCH (DRAPES) ×2 IMPLANT
ELECT PENCIL ROCKER SW 15FT (MISCELLANEOUS) ×5 IMPLANT
GAUZE PACKING 2X5 YD STRL (GAUZE/BANDAGES/DRESSINGS) ×2 IMPLANT
GAUZE SPONGE 4X4 16PLY XRAY LF (GAUZE/BANDAGES/DRESSINGS) ×10 IMPLANT
GLOVE BIO SURGEON STRL SZ 6.5 (GLOVE) ×4 IMPLANT
GLOVE BIO SURGEONS STRL SZ 6.5 (GLOVE) ×1
GLOVE BIOGEL M STRL SZ7.5 (GLOVE) ×5 IMPLANT
GLOVE BIOGEL PI IND STRL 7.0 (GLOVE) ×3 IMPLANT
GLOVE BIOGEL PI INDICATOR 7.0 (GLOVE) ×2
GLOVE ECLIPSE 6.5 STRL STRAW (GLOVE) ×5 IMPLANT
GLOVE ECLIPSE 8.5 STRL (GLOVE) ×5 IMPLANT
GOWN STRL REUS W/TWL XL LVL3 (GOWN DISPOSABLE) ×20 IMPLANT
HOLDER FOLEY CATH W/STRAP (MISCELLANEOUS) ×5 IMPLANT
IV NS 1000ML (IV SOLUTION)
IV NS 1000ML BAXH (IV SOLUTION) ×2 IMPLANT
KIT BASIN OR (CUSTOM PROCEDURE TRAY) ×5 IMPLANT
LIGASURE IMPACT 36 18CM CVD LR (INSTRUMENTS) ×2 IMPLANT
MANIFOLD NEPTUNE II (INSTRUMENTS) ×5 IMPLANT
NDL MAYO 6 CRC TAPER PT (NEEDLE) ×2 IMPLANT
NDL SPNL 22GX3.5 QUINCKE BK (NEEDLE) ×2 IMPLANT
NEEDLE HYPO 22GX1.5 SAFETY (NEEDLE) IMPLANT
NEEDLE MAYO 6 CRC TAPER PT (NEEDLE) ×5 IMPLANT
NEEDLE SPNL 22GX3.5 QUINCKE BK (NEEDLE) ×5 IMPLANT
NS IRRIG 1000ML POUR BTL (IV SOLUTION) ×2 IMPLANT
PACK CYSTO (CUSTOM PROCEDURE TRAY) ×5 IMPLANT
PACK GENERAL/GYN (CUSTOM PROCEDURE TRAY) ×2 IMPLANT
PACKING VAGINAL (PACKING) ×3 IMPLANT
PAD OB MATERNITY 4.3X12.25 (PERSONAL CARE ITEMS) ×2 IMPLANT
PLUG CATH AND CAP STER (CATHETERS) ×5 IMPLANT
RETRACTOR STAY HOOK 5MM (MISCELLANEOUS) ×2 IMPLANT
SHEET LAVH (DRAPES) ×5 IMPLANT
SPONGE LAP 4X18 X RAY DECT (DISPOSABLE) ×5 IMPLANT
SUT CAPIO ETHIBPND (SUTURE) ×6 IMPLANT
SUT VIC AB 0 CT1 18XCR BRD 8 (SUTURE) ×3 IMPLANT
SUT VIC AB 0 CT1 27 (SUTURE) ×15
SUT VIC AB 0 CT1 27XBRD ANTBC (SUTURE) ×5 IMPLANT
SUT VIC AB 0 CT1 36 (SUTURE) ×7 IMPLANT
SUT VIC AB 0 CT1 8-18 (SUTURE) ×15
SUT VIC AB 2-0 CT1 27 (SUTURE) ×10
SUT VIC AB 2-0 CT1 27XBRD (SUTURE) ×6 IMPLANT
SUT VIC AB 2-0 SH 27 (SUTURE) ×20
SUT VIC AB 2-0 SH 27X BRD (SUTURE) ×8 IMPLANT
SUT VIC AB 3-0 SH 27 (SUTURE) ×10
SUT VIC AB 3-0 SH 27XBRD (SUTURE) ×6 IMPLANT
SUT VICRYL 0 UR6 27IN ABS (SUTURE) ×10 IMPLANT
SYR 10ML LL (SYRINGE) ×5 IMPLANT
TOWEL OR 17X26 10 PK STRL BLUE (TOWEL DISPOSABLE) ×9 IMPLANT
TOWEL OR NON WOVEN STRL DISP B (DISPOSABLE) ×5 IMPLANT
TRAY FOLEY CATH 16FR SILVER (SET/KITS/TRAYS/PACK) ×5 IMPLANT
TUBING CONNECTING 10 (TUBING) ×4 IMPLANT
TUBING CONNECTING 10' (TUBING) ×1
TUTOPLAST AXIS 6X12 (Tissue) IMPLANT
WATER STERILE IRR 1000ML POUR (IV SOLUTION) ×7 IMPLANT
YANKAUER SUCT BULB TIP 10FT TU (MISCELLANEOUS) ×5 IMPLANT

## 2017-08-01 NOTE — Brief Op Note (Addendum)
08/01/2017  8:46 AM  PATIENT:  Renee [name]  72 y.o. female  PRE-OPERATIVE DIAGNOSIS:  [place], uterine decensus  POST-OPERATIVE DIAGNOSIS:  CYSTOSCELE RECTOCELE VAULT PROLAPSE, uterine decensus  PROCEDURE:  TVH, L salpingectomy  SURGEON:  Surgeon(s) and Role:  Panel 2:    Bobbye Charleston, MD - Primary    Jerelyn Charles, MD - Assisting  ANESTHESIA:   general  EBL:  <50 cc   LOCAL MEDICATIONS USED:  LIDOCAINE with epi  SPECIMEN:  Source of Specimen:  uterus, cervix, L tube  DISPOSITION OF SPECIMEN:  PATHOLOGY  COUNTS:  YES  TOURNIQUET:  * No tourniquets in log *  DICTATION: .Note written in EPIC  PLAN OF CARE: Admit for overnight observation  PATIENT DISPOSITION:  hemodynamically stable for Dr. McDiarmid's portion of case   Delay start of Pharmacological VTE agent (>24hrs) due to surgical blood loss or risk of bleeding: not applicable  Complications: none/  Findings: cystocele to +1, 2 cm size uterus and normal L tube and normal R ovary.  Prolapse to -2  Technique:  After general anesthesia was achieved, the patient was prepped and draped in a sterile fashion.  The bladder was emptied with a a foley. The cervix was grasped with a Lahey clamp and injected circumferentially with 0.5% marcaine with epi. A circumferential incision was made around the cervix with the bovie at the level of the reflection of the vagina onto the cervix and the posterior cul-de-sac was entered into with Mayo scissors. The long billed duckbill retractor was then placed and the bladder was removed off the cervix carefully with sharp dissection with the Metzenbaums. The the uterosacrals were grasped with a pair heney clamps on either side and secured with a Heaney stitch of 0 Vicryl. Cardinal ligament was then divided with alternating successive bites of the Heaney clamp followed by incision with the Mayo scissors and secured with stitches of 0 Vicryl at the level  of the cornua Heaneys were placed bilaterally around the entire pedicle and the uterus was able to be amputated.  The pedicles were secured with a free tie of 0 vicryl and a stitch of 0 vicryl. The L tube was identified and able to be grasped with a babcock.  A kelly clamp was put around the tube and the tube excised with mayos.  The pedicle was then secured with a free tie of 0 vicryl.  The ovary on the L was not seen.  The tube on the R was not seen or able to be brought into view with the babcock and was left in situ.  The R ovary appeared normal.    At this point the posterior cuff was secured with a running lock stitch of 0 vicryl.  Dr. McDiarmid desired the cuff to be left open for his repair and I handed the case over to him.     Brittanyann Wittner A

## 2017-08-01 NOTE — Interval H&P Note (Signed)
History and Physical Interval Note:  08/01/2017 8:05 AM  Renee [name]  has presented today for surgery, with the diagnosis of [place]  The various methods of treatment have been discussed with the patient and family. After consideration of risks, benefits and other options for treatment, the patient has consented to  Procedure(s) with comments: ANTERIOR (CYSTOCELE) AND POSTERIOR REPAIR (RECTOCELE) (N/A) VAULT PROLAPSE REPAIRSUSPENSION (N/A) - GRAFT CYSTOSCOPY (N/A) HYSTERECTOMY VAGINAL (N/A) BILATERAL SALPINGECTOMY (Bilateral) BILATRAL SALPINGO OOPHORECTOMY (Bilateral) as a surgical intervention .  The patient's history has been reviewed, patient examined, no change in status, stable for surgery.  I have reviewed the patient's chart and labs.  Questions were answered to the patient's satisfaction.     Chapel Silverthorn A

## 2017-08-01 NOTE — Op Note (Signed)
08/01/2017  8:46 AM  PATIENT:  [name]  72 y.o. female  PRE-OPERATIVE DIAGNOSIS:  [place], uterine decensus  POST-OPERATIVE DIAGNOSIS:  CYSTOSCELE RECTOCELE VAULT PROLAPSE, uterine decensus  PROCEDURE:  TVH, L salpingectomy  SURGEON:  Surgeon(s) and Role:  Panel 2:    Bobbye Charleston, MD - Primary    Jerelyn Charles, MD - Assisting  ANESTHESIA:   general  EBL:  <50 cc   LOCAL MEDICATIONS USED:  LIDOCAINE with epi  SPECIMEN:  Source of Specimen:  uterus, cervix, L tube  DISPOSITION OF SPECIMEN:  PATHOLOGY  COUNTS:  YES  TOURNIQUET:  * No tourniquets in log *  DICTATION: .Note written in EPIC  PLAN OF CARE: Admit for overnight observation  PATIENT DISPOSITION:  hemodynamically stable for Dr. McDiarmid's portion of case   Delay start of Pharmacological VTE agent (>24hrs) due to surgical blood loss or risk of bleeding: not applicable  Complications: none/  Findings: cystocele to +1, 2 cm size uterus and normal L tube and normal R ovary.  Prolapse to -2  Technique:  After general anesthesia was achieved, the patient was prepped and draped in a sterile fashion.  The bladder was emptied with a foley catheter. The cervix was grasped with a Lahey clamp and injected circumferentially with 0.5% marcaine with epi. A circumferential incision was made around the cervix with the bovie at the level of the reflection of the vagina onto the cervix and the posterior cul-de-sac was entered into with Mayo scissors. The long billed duckbill retractor was then placed and the bladder was removed off the cervix carefully with sharp dissection with the Metzenbaums. The the uterosacrals were grasped with a pair heney clamps on either side and secured with a Heaney stitch of 0 Vicryl. Cardinal ligament was then divided with alternating successive bites of the Heaney clamp followed by incision with the Mayo scissors and secured with stitches of 0 Vicryl at the  level of the cornua Heaneys were placed bilaterally around the entire pedicle and the uterus was able to be amputated.  The pedicles were secured with a free tie of 0 vicryl followed by a stitch of 0 vicryl. The L tube was identified and able to be grasped with a babcock.  A kelly clamp was put around the tube and the tube excised with mayos.  The pedicle was then secured with a free tie of 0 vicryl.  The ovary on the L was not seen.  The tube on the R was not seen or able to be brought into view with the babcock and was left in situ.  The R ovary appeared normal.    At this point the posterior cuff was secured with a running lock stitch of 0 vicryl.  Dr. McDiarmid desired the cuff to be left open for his repair and I handed the case over to him.     Sergio Zawislak A

## 2017-08-01 NOTE — Progress Notes (Signed)
There has been no change in the patients history, status or exam since the history and physical.  The pt did spend time in the ER yesterday for dizziness and palpitations, but everything checked out. Anesthesia is ok to proceed.  Pt desires to proceed.    Vitals:   08/01/17 0602 08/01/17 0703  BP: (!) 141/83   Pulse: 71   Resp: 18   Temp: 98.2 F (36.8 C)   TempSrc: Oral   SpO2: 100%   Weight:  163 lb (73.9 kg)  Height:  '5\' 3"'$  (1.6 m)    Results for orders placed or performed during the hospital encounter of 07/31/17 (from the past 72 hour(s))  CBC with Differential     Status: Abnormal   Collection Time: 07/31/17  4:40 PM  Result Value Ref Range   WBC 6.0 4.0 - 10.5 K/uL   RBC 5.21 (H) 3.87 - 5.11 MIL/uL   Hemoglobin 14.6 12.0 - 15.0 g/dL   HCT 43.6 36.0 - 46.0 %   MCV 83.7 78.0 - 100.0 fL   MCH 28.0 26.0 - 34.0 pg   MCHC 33.5 30.0 - 36.0 g/dL   RDW 13.5 11.5 - 15.5 %   Platelets 258 150 - 400 K/uL   Neutrophils Relative % 52 %   Neutro Abs 3.2 1.7 - 7.7 K/uL   Lymphocytes Relative 40 %   Lymphs Abs 2.4 0.7 - 4.0 K/uL   Monocytes Relative 6 %   Monocytes Absolute 0.3 0.1 - 1.0 K/uL   Eosinophils Relative 1 %   Eosinophils Absolute 0.1 0.0 - 0.7 K/uL   Basophils Relative 1 %   Basophils Absolute 0.0 0.0 - 0.1 K/uL    Comment: Performed at [facility], [address], [place], [place] [address]  Basic metabolic panel     Status: Abnormal   Collection Time: 07/31/17  4:40 PM  Result Value Ref Range   Sodium 139 135 - 145 mmol/L   Potassium 3.4 (L) 3.5 - 5.1 mmol/L   Chloride 103 101 - 111 mmol/L   CO2 25 22 - 32 mmol/L   Glucose, Bld 94 65 - 99 mg/dL   BUN 8 6 - 20 mg/dL   Creatinine, Ser 0.76 0.44 - 1.00 mg/dL   Calcium 10.0 8.9 - 10.3 mg/dL   GFR calc non Af Amer >60 >60 mL/min   GFR calc Af Amer >60 >60 mL/min    Comment: (NOTE) The eGFR has been calculated using the CKD EPI equation. This calculation has not been validated in all clinical  situations. eGFR's persistently <60 mL/min signify possible Chronic Kidney Disease.    Anion gap 11 5 - 15    Comment: Performed at [facility] [address] [place], [place] [address]  Brain natriuretic peptide     Status: None   Collection Time: 07/31/17  4:40 PM  Result Value Ref Range   B Natriuretic Peptide 54.7 0.0 - 100.0 pg/mL    Comment: Performed at [facility] [address] [place], [place] [address]  I-Stat Troponin, ED - 0, 3, 6 hours (not at [facility])     Status: None   Collection Time: 07/31/17  4:56 PM  Result Value Ref Range   Troponin i, poc 0.00 0.00 - 0.08 ng/mL   Comment 3            Comment: Due to the release kinetics of cTnI, a negative result within the first hours of the onset  of symptoms does not rule out myocardial infarction with certainty. If myocardial infarction is still suspected, repeat the test at appropriate intervals.     Rhiley Tarver A

## 2017-08-01 NOTE — Op Note (Signed)
Preoperative diagnosis: Cystocele and rectocele and mild vault prolapse Postoperative diagnosis: Cystocele and rectocele and mild vault prolapse Surgery: Cystocele repair and rectocele repair and cystoscopy Surgeon: Dr. Nicki Reaper Zylpha Poynor Assistant: Debbrah Alar  The assistant was present and necessary for all steps of the operation described. The assistant played a critical role assisting during the operation  The patient has the above diagnosis and consented to the above procedure.  Hysterectomy was performed by gynecology.  Ureteral sacral ligaments were tagged.  The patient actually had reasonable cuff support, a narrow vaginal vault, and a modest grade 2 cystocele with a short anterior vaginal wall.  20 cc of lidocaine epinephrine mixture was utilized under the epithelium  At the beginning of the case I performed a cystoscopy.  Likely due to hydration it took approximately 20 minutes to make certain there was excellent efflux bilaterally of yellow dye.  A cystocele was noted and there was no bladder injury from hysterectomy  With my Allis technique I made a long anterior vaginal wall incision and sharply mobilized the vaginal epithelium from the underlying pubocervical fascia to the white line bilaterally.  I was happy with my apical mobilization.  I did a 2 layer anterior repair and adjusted my retractors to make certain she had good length.  The anterior repair took much longer based upon her well supported vaginal cuff, and for the tendency for the anterior bladder wall to shorten almost like a thumb size cystocele.  I was very careful to do the repair and make certain length was retained.  I then cystoscoped the patient.  There was excellent efflux bilaterally.  There was a good cystocele repair cystoscopically and no injury  I took down my retractors and I felt the patient likely would be better off with a vault suspension.  She was in the gray area with reasonable support.  On the left side  with my usual technique I followed along the left lateral pelvic sidewall towards the spine.  I could not breakthrough tissue whatsoever safely.  I used controlled firm pressure and I was very pleased with the location and dissection plane.  I did not feel I could safely mobilized to the spine and sweep tissue medially so I stopped the dissection.  There was no bleeding associated  I trimmed an appropriate amount of anterior vaginal wall and closed the anterior vaginal wall with running 2-0 Vicryl and CT1 needle.    I closed the vaginal cuff with 0 Vicryl from left to right and right to left making sure each apex was obtained.  The ureteral sacral ligaments were reapproximated as a single suture bilaterally in the midline.  She had very good support in excellent length anteriorly.  Digital rectal examination demonstrated an obvious rectocele  20 cc of lidocaine epinephrine mixture was utilized underneath the vaginal epithelium.  Allis clamp was placed on the hymenal ring bilaterally.  I removed a small triangle perineal skin.  She had very elastic and mobile tissue.  I made a long posterior vaginal wall incision with my Allis technique.  I sharply mobilized the underlying rectovaginal fascia from the overlying vaginal epithelium.  I mobilized well at the apex.  Digital rectal examination noted fairly thick rectovaginal fascia but diffuse thinning.  A 2 layer repair was performed with running 2-0 Vicryl on an SH needle.  Rectal examination demonstrated good repair with no rectal injury.  Was diligent to make certain that rectal length was capped.  I trimmed only a few millimeters of  posterior vaginal wall bilaterally and closed the posterior vaginal wall with running 2-0 Vicryl and CT1 needle.  I did one gentle perineal suture with 0 Vicryl.  The 2-0 Vicryl was used to close the outer skin subcuticular suture.  The patient had excellent support of the apex at the end of the case.  She had very good  support anteriorly and posteriorly.  There is very good length.  There is no narrowing.  Blood loss was less than 75 mL.  Leg position was good.  Urine output was excellent.  Vaginal pack with estrogen cream was applied  Hopefully the operation will reach the patient's treatment goal

## 2017-08-01 NOTE — Anesthesia Procedure Notes (Signed)
Procedure Name: Intubation Date/Time: 08/01/2017 7:46 AM Performed by: Pilar Grammes, CRNA Pre-anesthesia Checklist: Patient identified, Emergency Drugs available, Suction available, Patient being monitored and Timeout performed Patient Re-evaluated:Patient Re-evaluated prior to induction Oxygen Delivery Method: Circle system utilized Preoxygenation: Pre-oxygenation with 100% oxygen Induction Type: IV induction Ventilation: Mask ventilation without difficulty Laryngoscope Size: Miller and 3 Tube type: Oral Tube size: 7.5 mm Number of attempts: 1 Airway Equipment and Method: Stylet Placement Confirmation: positive ETCO2,  ETT inserted through vocal cords under direct vision,  CO2 detector and breath sounds checked- equal and bilateral Secured at: 19 cm Tube secured with: Tape Dental Injury: Teeth and Oropharynx as per pre-operative assessment

## 2017-08-01 NOTE — Transfer of Care (Signed)
Immediate Anesthesia Transfer of Care Note  Patient: Renee [name]  Procedure(s) Performed: ANTERIOR (CYSTOCELE) AND POSTERIOR REPAIR (RECTOCELE) (N/A ) CYSTOSCOPY (N/A ) HYSTERECTOMY VAGINAL (N/A ) SALPINGECTOMY (Left )  Patient Location: PACU  Anesthesia Type:General  Level of Consciousness: alert , sedated and patient cooperative  Airway & Oxygen Therapy: Patient connected to face mask oxygen  Post-op Assessment: Report given to RN, Post -op Vital signs reviewed and stable and Patient moving all extremities X 4  Post vital signs: stable  Last Vitals:  Vitals:   08/01/17 0602  BP: (!) 141/83  Pulse: 71  Resp: 18  Temp: 36.8 C  SpO2: 100%    Last Pain:  Vitals:   08/01/17 0602  TempSrc: Oral      Patients Stated Pain Goal: 4 (03/05/29 0762)  Complications: No apparent anesthesia complications

## 2017-08-01 NOTE — Anesthesia Preprocedure Evaluation (Signed)
Anesthesia Evaluation  Patient identified by MRN, date of birth, ID band Patient awake    Reviewed: Allergy & Precautions, NPO status , Patient's Chart, lab work & pertinent test results  History of Anesthesia Complications (+) PROLONGED EMERGENCE  Airway Mallampati: II  TM Distance: >3 FB Neck ROM: Full    Dental no notable dental hx.    Pulmonary neg pulmonary ROS,    Pulmonary exam normal breath sounds clear to auscultation       Cardiovascular hypertension, Pt. on medications + CAD  Normal cardiovascular exam+ dysrhythmias Atrial Fibrillation and Supra Ventricular Tachycardia + pacemaker  Rhythm:Regular Rate:Normal     Neuro/Psych Anxiety Depression negative neurological ROS     GI/Hepatic negative GI ROS, Neg liver ROS,   Endo/Other  diabetes  Renal/GU negative Renal ROS  negative genitourinary   Musculoskeletal negative musculoskeletal ROS (+) Fibromyalgia -  Abdominal   Peds negative pediatric ROS (+)  Hematology negative hematology ROS (+)   Anesthesia Other Findings   Reproductive/Obstetrics negative OB ROS                           Anesthesia Physical Anesthesia Plan  ASA: III  Anesthesia Plan: General   Post-op Pain Management:    Induction: Intravenous  PONV Risk Score and Plan: 4 or greater and Ondansetron, Dexamethasone and Treatment may vary due to age or medical condition  Airway Management Planned: Oral ETT  Additional Equipment:   Intra-op Plan:   Post-operative Plan: Extubation in OR  Informed Consent: I have reviewed the patients History and Physical, chart, labs and discussed the procedure including the risks, benefits and alternatives for the proposed anesthesia with the patient or authorized representative who has indicated his/her understanding and acceptance.   Dental advisory given  Plan Discussed with: CRNA  Anesthesia Plan Comments:          Anesthesia Quick Evaluation

## 2017-08-02 DIAGNOSIS — E1140 Type 2 diabetes mellitus with diabetic neuropathy, unspecified: Secondary | ICD-10-CM | POA: Diagnosis not present

## 2017-08-02 DIAGNOSIS — Z79899 Other long term (current) drug therapy: Secondary | ICD-10-CM | POA: Diagnosis not present

## 2017-08-02 DIAGNOSIS — Z7982 Long term (current) use of aspirin: Secondary | ICD-10-CM | POA: Diagnosis not present

## 2017-08-02 DIAGNOSIS — I10 Essential (primary) hypertension: Secondary | ICD-10-CM | POA: Diagnosis not present

## 2017-08-02 DIAGNOSIS — N816 Rectocele: Secondary | ICD-10-CM | POA: Diagnosis not present

## 2017-08-02 DIAGNOSIS — I2510 Atherosclerotic heart disease of native coronary artery without angina pectoris: Secondary | ICD-10-CM | POA: Diagnosis not present

## 2017-08-02 DIAGNOSIS — K219 Gastro-esophageal reflux disease without esophagitis: Secondary | ICD-10-CM | POA: Diagnosis not present

## 2017-08-02 DIAGNOSIS — N8111 Cystocele, midline: Secondary | ICD-10-CM | POA: Diagnosis not present

## 2017-08-02 DIAGNOSIS — Z950 Presence of cardiac pacemaker: Secondary | ICD-10-CM | POA: Diagnosis not present

## 2017-08-02 DIAGNOSIS — Z96652 Presence of left artificial knee joint: Secondary | ICD-10-CM | POA: Diagnosis not present

## 2017-08-02 LAB — BASIC METABOLIC PANEL
Anion gap: 9 (ref 5–15)
BUN: 13 mg/dL (ref 6–20)
CHLORIDE: 104 mmol/L (ref 101–111)
CO2: 26 mmol/L (ref 22–32)
Calcium: 9.3 mg/dL (ref 8.9–10.3)
Creatinine, Ser: 0.7 mg/dL (ref 0.44–1.00)
GFR calc non Af Amer: >60 mL/min (ref >60)
Glucose, Bld: 111 mg/dL — ABNORMAL HIGH (ref 65–99)
POTASSIUM: 3.7 mmol/L (ref 3.5–5.1)
SODIUM: 139 mmol/L (ref 135–145)

## 2017-08-02 LAB — GLUCOSE, CAPILLARY
GLUCOSE-CAPILLARY: 109 mg/dL — AB (ref 65–99)
GLUCOSE-CAPILLARY: 154 mg/dL — AB (ref 65–99)

## 2017-08-02 LAB — HEMOGLOBIN AND HEMATOCRIT, BLOOD
HEMATOCRIT: 34.4 % — AB (ref 36.0–46.0)
Hemoglobin: 11.3 g/dL — ABNORMAL LOW (ref 12.0–15.0)

## 2017-08-02 NOTE — Progress Notes (Signed)
Post void residual 74ml. Updated MD. MD with order to DC pt

## 2017-08-02 NOTE — Care Management Note (Signed)
Case Management Note  Patient Details  Name: ANGELICE PIECH MRN: [id number] Date of Birth: 11/11/1945  Subjective/Objective:                    Action/Plan:d/c home.   Expected Discharge Date:  08/02/17               Expected Discharge Plan:  Home/Self Care  In-House Referral:     Discharge planning Services  CM Consult  Post Acute Care Choice:    Choice offered to:     DME Arranged:    DME Agency:     HH Arranged:    HH Agency:     Status of Service:  Completed, signed off  If discussed at [employer] of Stay Meetings, dates discussed:    Additional Comments:  Dessa Phi, RN 08/02/2017, 9:30 AM

## 2017-08-02 NOTE — Progress Notes (Signed)
Patient is eating, ambulating, catheter in place.  Pain control is good.  Vitals:   08/01/17 1610 08/01/17 2032 08/02/17 0005 08/02/17 0453  BP: 132/85 111/61 (!) 111/51 (!) 114/57  Pulse:  66 67 66  Resp: 13 16 14 16   Temp: 98.1 F (36.7 C) 98.2 F (36.8 C) 98 F (36.7 C) 98.2 F (36.8 C)  TempSrc:  Oral Oral Oral  SpO2: 99% 100% 99% 100%  Weight:      Height:        lungs:  clear to auscultation cor:    RRR Abdomen:  soft, appropriate tenderness, incisions intact and without erythema or exudate. ex:    no cords   Lab Results  Component Value Date   WBC 6.0 07/31/2017   HGB 11.3 (L) 08/02/2017   HCT 34.4 (L) 08/02/2017   MCV 83.7 07/31/2017   PLT 258 07/31/2017    A/P  POD#1 TV[facility] with L salpingectomy; cystocele and rectocele repair by Dr. McDiarmid.  Routine care .  Expect d/c per plan.  Pt ok to be d/ced from my standpoint with percocet for pain control.  Pt to f/u with me in two weeks.

## 2017-08-02 NOTE — Discharge Summary (Signed)
[facility] Discharge Summary  Admit date: 08/01/2017  Discharge date and time: 08/02/17   Discharge to: Home  Discharge Service: Urology   Discharge Attending Physician:  Dr. Nicki Reaper Macdiarmid  Discharge  Diagnoses: Cystocele, Rectocele   Secondary Diagnosis: Active Problems:   Cystocele, midline   OR Procedures: Procedure(s): ANTERIOR (CYSTOCELE) AND POSTERIOR REPAIR (RECTOCELE) CYSTOSCOPY HYSTERECTOMY VAGINAL SALPINGECTOMY 08/01/2017   Ancillary Procedures: None   Discharge Day Services: The patient was seen and examined by the Urology team both in the morning and immediately prior to discharge.  Vital signs and laboratory values were stable and within normal limits.  The physical exam was benign and unchanged and all surgical wounds were examined.  Discharge instructions were explained and all questions answered.  Subjective  No acute events overnight. Pain Controlled. No fever or chills.  Objective Patient Vitals for the past 8 hrs:  BP Temp Temp src Pulse Resp SpO2  08/02/17 0453 (!) 114/57 98.2 F (36.8 C) Oral 66 16 100 %   Total I/O In: -  Out: 500 [Urine:500]  General Appearance:        No acute distress Lungs:                 Normal work of breathing on room air Heart:                             Regular rate and rhythm Abdomen:                       Soft, non-tender, non-distended  Extremities:                  Warm and well perfused    Hospital Course:  The patient underwent cystocele and rectocele repair, as well as vaginal hysterectomy on 08/01/2017.  The patient tolerated the procedure well, was extubated in the OR, and afterwards was taken to the PACU for routine post-surgical care. When stable the patient was transferred to the floor.   The patient did well postoperatively.  The patient's diet was slowly advanced and at the time of discharge was tolerating a regular diet. Foley catheter and vaginal packing were removed on POD1, and patient passed  TOV.  The patient was discharged home 1 Day Post-Op, at which point was tolerating a regular solid diet, was able to void spontaneously, have adequate pain control with P.O. pain medication, and could ambulate without difficulty. The patient will follow up with [place] for post op check.   Condition at Discharge: Improved  Discharge Medications:  Allergies as of 08/02/2017      Reactions   Cyclobenzaprine Other (See Comments)   Pt states that this medication does not work for her.     Azithromycin Other (See Comments)   Cannot take due to prolonged QT   Adhesive [tape] Itching, Rash, Other (See Comments)   Reaction:  Blisters  Pt states that she is allergic to the adhesive backing of EKG pads if left on for more than 24 hours Patient states she tolerates paper tape ok   Moxifloxacin Other (See Comments)   Unknown allergic reaction many years ago   Quinolones Other (See Comments)   Unknown allergic reaction many years ago      Medication List    STOP taking these medications   ALEVE 220 MG tablet Generic drug:  naproxen sodium   aspirin EC 81 MG tablet  TAKE these medications   accu-chek multiclix lancets 1 each by Other route 2 (two) times daily. Use to help check blood sugars twice a day Dx e11.9   ALPRAZolam 0.5 MG tablet Commonly known as:  XANAX Take 0.5-1 tablets (0.25-0.5 mg total) by mouth 2 (two) times daily as needed for anxiety.   cetirizine 10 MG tablet Commonly known as:  ZYRTEC Take 10 mg by mouth daily as needed for allergies.   diltiazem 120 MG 24 hr capsule Commonly known as:  CARDIZEM CD Take 1 capsule (120 mg total) by mouth every morning.   flecainide 100 MG tablet Commonly known as:  TAMBOCOR Take 1 tablet (100 mg total) by mouth 2 (two) times daily.   furosemide 40 MG tablet Commonly known as:  LASIX Take 1 tablet (40 mg total) by mouth daily.   glucose blood test strip Commonly known as:  ACCU-CHEK SMARTVIEW 1 each by Other route 2 (two)  times daily. Use to check blood sugars twice a day Dx e11.9   HYDROcodone-acetaminophen 5-325 MG tablet Commonly known as:  NORCO Take 1-2 tablets by mouth every 6 (six) hours as needed for moderate pain or severe pain.   nitroGLYCERIN 0.4 MG SL tablet Commonly known as:  NITROSTAT Place 1 tablet (0.4 mg total) under the tongue every 5 (five) minutes as needed for chest pain.   omeprazole 40 MG capsule Commonly known as:  PRILOSEC Take 1 capsule (40 mg total) by mouth every evening. What changed:  additional instructions   potassium chloride 10 MEQ CR capsule Commonly known as:  MICRO-K TAKE 1 CAPSULE BY MOUTH  DAILY.   sodium chloride 0.65 % Soln nasal spray Commonly known as:  OCEAN Place 1-2 sprays into both nostrils 4 (four) times daily as needed for congestion.   triamterene-hydrochlorothiazide 37.5-25 MG capsule Commonly known as:  DYAZIDE Take 1 capsule by mouth daily.   zolpidem 5 MG tablet Commonly known as:  AMBIEN Take 0.5-1 tablets (2.5-5 mg total) by mouth at bedtime as needed for sleep.        Pending Test Results: None  Discharge Instructions:   Follow up:  Follow-up Information    MacDiarmid, Nicki Reaper, MD Follow up.   Specialty:  Urology Why:  office will call you with date and time of appt.  Contact information: [place] [address] [phone]

## 2017-08-02 NOTE — Progress Notes (Signed)
Looks good Vitals ok OR findings described again Void trial  Post op detailed

## 2017-08-02 NOTE — Progress Notes (Signed)
Foley and packing removed. Pt tolerated well. No apparent signs of distress. Will monitor urine output and pain

## 2017-08-02 NOTE — Discharge Instructions (Signed)
I have reviewed discharge instructions in detail with the patient. They will follow-up with me or their physician as scheduled. My nurse will also be calling the patients as per protocol. As discussed with Dr. Larra Crunkleton. ° °You may resume aspirin, advil, aleve, vitamins, and supplements 7 days after surgery. °

## 2017-08-03 NOTE — Anesthesia Postprocedure Evaluation (Signed)
Anesthesia Post Note  Patient: XYLINA RHOADS  Procedure(s) Performed: ANTERIOR (CYSTOCELE) AND POSTERIOR REPAIR (RECTOCELE) (N/A ) CYSTOSCOPY (N/A ) HYSTERECTOMY VAGINAL (N/A ) SALPINGECTOMY (Left )     Patient location during evaluation: PACU Anesthesia Type: General Level of consciousness: awake and alert Pain management: pain level controlled Vital Signs Assessment: post-procedure vital signs reviewed and stable Respiratory status: spontaneous breathing, nonlabored ventilation, respiratory function stable and patient connected to nasal cannula oxygen Cardiovascular status: blood pressure returned to baseline and stable Postop Assessment: no apparent nausea or vomiting Anesthetic complications: no    Last Vitals:  Vitals:   08/02/17 0005 08/02/17 0453  BP: (!) 111/51 (!) 114/57  Pulse: 67 66  Resp: 14 16  Temp: 36.7 C 36.8 C  SpO2: 99% 100%    Last Pain:  Vitals:   08/02/17 1111  TempSrc:   PainSc: 3                  Montez Hageman

## 2017-08-09 ENCOUNTER — Ambulatory Visit (INDEPENDENT_AMBULATORY_CARE_PROVIDER_SITE_OTHER): Payer: Medicare Other | Admitting: *Deleted

## 2017-08-09 DIAGNOSIS — I495 Sick sinus syndrome: Secondary | ICD-10-CM

## 2017-08-09 NOTE — Progress Notes (Signed)
Remote pacemaker transmission.   

## 2017-08-10 ENCOUNTER — Encounter: Payer: Self-pay | Admitting: Cardiology

## 2017-08-14 ENCOUNTER — Other Ambulatory Visit (HOSPITAL_COMMUNITY): Payer: Self-pay | Admitting: *Deleted

## 2017-08-14 DIAGNOSIS — Z950 Presence of cardiac pacemaker: Secondary | ICD-10-CM

## 2017-08-14 MED ORDER — FLECAINIDE ACETATE 100 MG PO TABS: 100.0000 mg | ORAL_TABLET | Freq: Two times a day (BID) | ORAL | 3 refills | Status: DC

## 2017-08-16 DIAGNOSIS — N816 Rectocele: Secondary | ICD-10-CM | POA: Diagnosis not present

## 2017-08-16 DIAGNOSIS — N8111 Cystocele, midline: Secondary | ICD-10-CM | POA: Diagnosis not present

## 2017-08-17 ENCOUNTER — Other Ambulatory Visit (HOSPITAL_COMMUNITY): Payer: Self-pay | Admitting: Internal Medicine

## 2017-08-17 ENCOUNTER — Other Ambulatory Visit: Payer: Self-pay | Admitting: Internal Medicine

## 2017-08-22 ENCOUNTER — Ambulatory Visit: Payer: Medicare Other | Admitting: Podiatry

## 2017-08-26 LAB — CUP PACEART REMOTE DEVICE CHECK
Implantable Lead Implant Date: 20170921
Implantable Lead Model: 377
Implantable Lead Model: 377
Implantable Lead Serial Number: 49580836
Lead Channel Setting Pacing Amplitude: 1.1 V
MDC IDC LEAD IMPLANT DT: 20170921
MDC IDC LEAD LOCATION: 753859
MDC IDC LEAD LOCATION: 753860
MDC IDC LEAD SERIAL: 49575519
MDC IDC PG IMPLANT DT: 20170921
MDC IDC SESS DTM: 20190316190647
MDC IDC SET LEADCHNL RA PACING AMPLITUDE: 1.5 V
MDC IDC SET LEADCHNL RV PACING PULSEWIDTH: 0.4 ms
Pulse Gen Serial Number: 68850891

## 2017-09-22 ENCOUNTER — Ambulatory Visit (HOSPITAL_COMMUNITY)
Admission: RE | Admit: 2017-09-22 | Discharge: 2017-09-22 | Disposition: A | Discharge status: Home / Self Care | Payer: Medicare Other | Source: Ambulatory Visit | Attending: Cardiology | Admitting: Cardiology

## 2017-09-22 VITALS — BP 147/74 | HR 80 | Wt 164.8 lb

## 2017-09-22 DIAGNOSIS — I480 Paroxysmal atrial fibrillation: Secondary | ICD-10-CM | POA: Insufficient documentation

## 2017-09-22 DIAGNOSIS — E119 Type 2 diabetes mellitus without complications: Secondary | ICD-10-CM | POA: Diagnosis not present

## 2017-09-22 DIAGNOSIS — I10 Essential (primary) hypertension: Secondary | ICD-10-CM | POA: Diagnosis not present

## 2017-09-22 DIAGNOSIS — Z950 Presence of cardiac pacemaker: Secondary | ICD-10-CM | POA: Diagnosis not present

## 2017-09-22 DIAGNOSIS — I2510 Atherosclerotic heart disease of native coronary artery without angina pectoris: Secondary | ICD-10-CM | POA: Diagnosis not present

## 2017-09-22 DIAGNOSIS — I442 Atrioventricular block, complete: Secondary | ICD-10-CM | POA: Diagnosis not present

## 2017-09-22 DIAGNOSIS — I471 Supraventricular tachycardia: Secondary | ICD-10-CM | POA: Insufficient documentation

## 2017-09-22 DIAGNOSIS — Z8541 Personal history of malignant neoplasm of cervix uteri: Secondary | ICD-10-CM | POA: Diagnosis not present

## 2017-09-22 DIAGNOSIS — K589 Irritable bowel syndrome without diarrhea: Secondary | ICD-10-CM | POA: Diagnosis not present

## 2017-09-22 DIAGNOSIS — R55 Syncope and collapse: Secondary | ICD-10-CM | POA: Insufficient documentation

## 2017-09-22 DIAGNOSIS — I4581 Long QT syndrome: Secondary | ICD-10-CM | POA: Diagnosis not present

## 2017-09-22 DIAGNOSIS — Z8619 Personal history of other infectious and parasitic diseases: Secondary | ICD-10-CM | POA: Diagnosis not present

## 2017-09-22 DIAGNOSIS — I495 Sick sinus syndrome: Secondary | ICD-10-CM | POA: Diagnosis not present

## 2017-09-22 DIAGNOSIS — Z79899 Other long term (current) drug therapy: Secondary | ICD-10-CM | POA: Diagnosis not present

## 2017-09-22 NOTE — Patient Instructions (Signed)
Your physician recommends that you schedule a follow-up appointment in: 6 months (October, 2019) with Dr. Aundra Dubin Please Call an Schedule Appointment
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
## 2017-09-25 DIAGNOSIS — Z85828 Personal history of other malignant neoplasm of skin: Secondary | ICD-10-CM | POA: Diagnosis not present

## 2017-09-25 DIAGNOSIS — L821 Other seborrheic keratosis: Secondary | ICD-10-CM | POA: Diagnosis not present

## 2017-09-25 DIAGNOSIS — D225 Melanocytic nevi of trunk: Secondary | ICD-10-CM | POA: Diagnosis not present

## 2017-09-25 DIAGNOSIS — D2262 Melanocytic nevi of left upper limb, including shoulder: Secondary | ICD-10-CM | POA: Diagnosis not present

## 2017-09-25 DIAGNOSIS — L814 Other melanin hyperpigmentation: Secondary | ICD-10-CM | POA: Diagnosis not present

## 2017-09-25 DIAGNOSIS — C44311 Basal cell carcinoma of skin of nose: Secondary | ICD-10-CM | POA: Diagnosis not present

## 2017-10-10 DIAGNOSIS — N816 Rectocele: Secondary | ICD-10-CM | POA: Diagnosis not present

## 2017-10-17 ENCOUNTER — Ambulatory Visit: Payer: Medicare Other | Admitting: Podiatry

## 2017-10-17 DIAGNOSIS — C44311 Basal cell carcinoma of skin of nose: Secondary | ICD-10-CM | POA: Diagnosis not present

## 2017-10-20 ENCOUNTER — Other Ambulatory Visit: Payer: Self-pay | Admitting: Internal Medicine

## 2017-10-20 DIAGNOSIS — E042 Nontoxic multinodular goiter: Secondary | ICD-10-CM

## 2017-10-20 DIAGNOSIS — E65 Localized adiposity: Secondary | ICD-10-CM | POA: Diagnosis not present

## 2017-10-20 DIAGNOSIS — R61 Generalized hyperhidrosis: Secondary | ICD-10-CM | POA: Diagnosis not present

## 2017-10-20 DIAGNOSIS — R5383 Other fatigue: Secondary | ICD-10-CM | POA: Diagnosis not present

## 2017-10-24 ENCOUNTER — Ambulatory Visit
Admission: RE | Admit: 2017-10-24 | Discharge: 2017-10-24 | Disposition: A | Discharge status: Home / Self Care | Payer: Medicare Other | Source: Ambulatory Visit | Attending: Internal Medicine | Admitting: Internal Medicine

## 2017-10-24 DIAGNOSIS — E042 Nontoxic multinodular goiter: Secondary | ICD-10-CM

## 2017-11-08 ENCOUNTER — Ambulatory Visit (INDEPENDENT_AMBULATORY_CARE_PROVIDER_SITE_OTHER): Payer: Medicare Other | Admitting: *Deleted

## 2017-11-08 DIAGNOSIS — R55 Syncope and collapse: Secondary | ICD-10-CM | POA: Diagnosis not present

## 2017-11-08 NOTE — Progress Notes (Signed)
Remote pacemaker transmission.   

## 2017-11-10 LAB — CUP PACEART REMOTE DEVICE CHECK
Brady Statistic AP VP Percent: 0 %
Brady Statistic AP VS Percent: 75 %
Brady Statistic AS VP Percent: 0 %
Brady Statistic AS VS Percent: 25 %
Date Time Interrogation Session: 20190531061904
Implantable Lead Implant Date: 20170921
Implantable Lead Location: 753860
Implantable Lead Serial Number: 49580836
Lead Channel Impedance Value: 537 Ohm
Lead Channel Pacing Threshold Amplitude: 0.5 V
Lead Channel Pacing Threshold Pulse Width: 0.4 ms
Lead Channel Setting Pacing Pulse Width: 0.4 ms
MDC IDC LEAD IMPLANT DT: 20170921
MDC IDC LEAD LOCATION: 753859
MDC IDC LEAD SERIAL: 49575519
MDC IDC MSMT BATTERY REMAINING PERCENTAGE: 85 %
MDC IDC MSMT LEADCHNL RA IMPEDANCE VALUE: 504 Ohm
MDC IDC MSMT LEADCHNL RV PACING THRESHOLD AMPLITUDE: 0.7 V
MDC IDC MSMT LEADCHNL RV PACING THRESHOLD PULSEWIDTH: 0.4 ms
MDC IDC PG IMPLANT DT: 20170921
MDC IDC SET LEADCHNL RA PACING AMPLITUDE: 1.5 V
MDC IDC SET LEADCHNL RV PACING AMPLITUDE: 1.3 V
MDC IDC STAT BRADY RA PERCENT PACED: 75 %
MDC IDC STAT BRADY RV PERCENT PACED: 0 %
Pulse Gen Model: 407145
Pulse Gen Serial Number: 68850891

## 2017-11-15 ENCOUNTER — Telehealth: Payer: Self-pay

## 2017-11-15 MED ORDER — PREDNISONE 10 MG PO TABS: ORAL_TABLET | ORAL | 0 refills | Status: DC

## 2017-11-15 NOTE — Telephone Encounter (Signed)
Done erx to [facility] 

## 2017-11-15 NOTE — Telephone Encounter (Signed)
Copied from [place] [id number]. Topic: General - Other >> Nov 15, 2017  8:30 AM Carolyn Stare wrote:  Pt said she has a bad case of poison ivyand said she know what it is and is asking if prednisone can be called in for her. I offered her an appt she wanted to wait for the call back    [place]

## 2017-11-15 NOTE — Addendum Note (Signed)
Addended by: Biagio Borg on: 11/15/2017 10:30 AM   Modules accepted: Orders

## 2017-11-17 ENCOUNTER — Telehealth: Payer: Self-pay | Admitting: Cardiology

## 2017-11-17 NOTE — Telephone Encounter (Signed)
Spoke pt informed her that her monitor had updated this morning at 2:17am and there were no anomalities pt reported that it may have been between the hours of 2:30am and 3:30am that she felt like her heart was racing, pt asked what the parameters are for recording informed her that her heart rate would need to get over 150bpm for the pacemaker to record, pt voiced understanding pt asked that someone call her back on Monday with a report from her pacemaker, informed pt that I would have someone call her.

## 2017-11-17 NOTE — Telephone Encounter (Signed)
Patient wants to know if she was having an irregular rhythm between 2:00 AM - 3:30 AM. She wants to know if her device showed any irregular rhythm. Informed pt that the Device Tech RN will review the transmission and call her back.

## 2017-11-21 NOTE — Telephone Encounter (Signed)
Spoke with Ms. Montee- no recordings on Friday morning 2:30-3:30. Normal pacemaker function. She verbalizes understanding.

## 2017-12-11 ENCOUNTER — Other Ambulatory Visit: Payer: Self-pay

## 2017-12-11 ENCOUNTER — Other Ambulatory Visit (HOSPITAL_COMMUNITY): Payer: Self-pay | Admitting: *Deleted

## 2017-12-11 ENCOUNTER — Telehealth: Payer: Self-pay

## 2017-12-11 DIAGNOSIS — Z950 Presence of cardiac pacemaker: Secondary | ICD-10-CM

## 2017-12-11 MED ORDER — TRIAMTERENE-HCTZ 37.5-25 MG PO CAPS: 1.0000 | ORAL_CAPSULE | Freq: Every day | ORAL | 11 refills | Status: DC

## 2017-12-11 MED ORDER — FLECAINIDE ACETATE 100 MG PO TABS: 100.0000 mg | ORAL_TABLET | Freq: Two times a day (BID) | ORAL | 11 refills | Status: DC

## 2017-12-11 MED ORDER — POTASSIUM CHLORIDE ER 10 MEQ PO CPCR: 10.0000 meq | ORAL_CAPSULE | Freq: Every day | ORAL | 11 refills | Status: DC

## 2017-12-11 NOTE — Telephone Encounter (Signed)
Patient called in to speak with nurse about her flecainide and I explained dr Aundra Dubin had filled her medication last time. Patient was very adamant that she wanted to speak with nurse to get her opinion on what she should do. Patient never explained what exactly she was talking about or why she needed the nurse. I gave her CHF phone number and sent message to Dr. Lovena Le nurse to call patient back and refill request to dr Aundra Dubin office for refill to local pharmacy.

## 2017-12-11 NOTE — Telephone Encounter (Signed)
Refills sent per Pt request to [facility] in [facility].

## 2017-12-28 ENCOUNTER — Telehealth: Payer: Self-pay | Admitting: Internal Medicine

## 2017-12-28 NOTE — Telephone Encounter (Signed)
Copied from [place] [id number] [id number]. Topic: Quick Communication - Rx Refill/Question >> Dec 28, 2017  4:17 PM Neva Seat wrote: ALPRAZolam Duanne Moron) 0.5 MG tablet Needing 3 months supply for free  Needing refill  [place], [address] [facility] [address] [place] [address] Phone: [phone] Fax: [phone]

## 2017-12-29 NOTE — Telephone Encounter (Signed)
Xanax refill. Pt needs 3 month supply for free. Last Refill:01/15/15 #90 with 1 refill Last OV: 01/31/17 Next OV 02/01/18 PCP: Dr. Ronnald Ramp Pharmacy: [facility]

## 2017-12-29 NOTE — Telephone Encounter (Signed)
Will hold until MD return for 90 day.Marland KitchenJohny [name]

## 2017-12-30 MED ORDER — ALPRAZOLAM 0.5 MG PO TABS: 0.2500 mg | ORAL_TABLET | Freq: Two times a day (BID) | ORAL | 1 refills | Status: DC | PRN

## 2017-12-30 NOTE — Telephone Encounter (Signed)
Done erx 

## 2018-01-01 NOTE — Telephone Encounter (Signed)
MD approved and sent rx to [facility].Marland KitchenJohny [name]

## 2018-01-08 ENCOUNTER — Other Ambulatory Visit (HOSPITAL_COMMUNITY): Payer: Self-pay | Admitting: Internal Medicine

## 2018-01-12 ENCOUNTER — Telehealth: Payer: Self-pay | Admitting: Gastroenterology

## 2018-01-15 ENCOUNTER — Telehealth: Payer: Self-pay | Admitting: Emergency Medicine

## 2018-01-15 NOTE — Telephone Encounter (Signed)
Left a message for patient to return my call. 

## 2018-01-15 NOTE — Telephone Encounter (Signed)
Called patient to schedule AWV. Patient declined at this time. 

## 2018-01-16 ENCOUNTER — Telehealth: Payer: Self-pay | Admitting: Internal Medicine

## 2018-01-16 MED ORDER — OMEPRAZOLE 40 MG PO CPDR: 40.0000 mg | DELAYED_RELEASE_CAPSULE | Freq: Every evening | ORAL | 2 refills | Status: DC

## 2018-01-16 NOTE — Telephone Encounter (Signed)
Called patient and she states someone already called her back and phone went silent and then hung up.

## 2018-01-16 NOTE — Telephone Encounter (Signed)
Omeprazole (Prilosec) 40mg  cap refill Last Refilled by a different provider Last OV: 06/23/17 for acute visit with Dr. Sharlet Salina Next OV: 02/01/18 PCP: Dr. Jenny Reichmann Pharmacy: [facility] in [place], [place]  [address]

## 2018-01-16 NOTE — Telephone Encounter (Signed)
Copied from [place] [id number]. Topic: Quick Communication - Rx Refill/Question >> Jan 16, 2018  3:23 PM Wynetta Emery, [place] C wrote: Medication: omeprazole (PRILOSEC) 40 MG capsule   Has the patient contacted their pharmacy? No  (Agent: If no, request that the patient contact the pharmacy for the refill.) (Agent: If yes, when and what did the pharmacy advise?)  Preferred Pharmacy (with phone number or street name): [place], [place] - [address] [phone] (Phone) [phone] (Fax)      Agent: Please be advised that RX refills may take up to 3 business days. We ask that you follow-up with your pharmacy.

## 2018-01-18 ENCOUNTER — Ambulatory Visit (INDEPENDENT_AMBULATORY_CARE_PROVIDER_SITE_OTHER): Payer: Medicare Other | Admitting: Podiatry

## 2018-01-18 ENCOUNTER — Encounter: Payer: Self-pay | Admitting: Podiatry

## 2018-01-18 DIAGNOSIS — M722 Plantar fascial fibromatosis: Secondary | ICD-10-CM

## 2018-01-18 NOTE — Progress Notes (Signed)
Follow-up of the bilateral heels.  She says it hurts right in here to her she refers to the plantar fascial area. Objective: Vital signs are stable alert and oriented x3.  Pain on palpation medial calcaneal tubercle.  Assessment: Plantar fasciitis chronic in nature.  Plan: Injected the bilateral heels today with 20 mg Kenalog 5 mg Marcaine point maximal tenderness.  And placement plantar fascial brace/night splint.  May need to consider physical therapy and orthotics next visit.

## 2018-01-30 ENCOUNTER — Other Ambulatory Visit: Payer: Self-pay | Admitting: Internal Medicine

## 2018-01-30 DIAGNOSIS — Z1231 Encounter for screening mammogram for malignant neoplasm of breast: Secondary | ICD-10-CM

## 2018-02-01 ENCOUNTER — Encounter: Payer: Self-pay | Admitting: Internal Medicine

## 2018-02-01 ENCOUNTER — Other Ambulatory Visit (INDEPENDENT_AMBULATORY_CARE_PROVIDER_SITE_OTHER): Payer: Medicare Other

## 2018-02-01 ENCOUNTER — Ambulatory Visit (INDEPENDENT_AMBULATORY_CARE_PROVIDER_SITE_OTHER): Payer: Medicare Other | Admitting: Internal Medicine

## 2018-02-01 VITALS — BP 140/80 | HR 70 | Temp 98.4°F | Ht 63.0 in | Wt 163.8 lb

## 2018-02-01 DIAGNOSIS — R739 Hyperglycemia, unspecified: Secondary | ICD-10-CM

## 2018-02-01 DIAGNOSIS — Z0000 Encounter for general adult medical examination without abnormal findings: Secondary | ICD-10-CM

## 2018-02-01 LAB — CBC WITH DIFFERENTIAL/PLATELET
Basophils Absolute: 0 10*3/uL (ref 0.0–0.1)
Basophils Relative: 0.5 % (ref 0.0–3.0)
EOS PCT: 1 % (ref 0.0–5.0)
Eosinophils Absolute: 0.1 10*3/uL (ref 0.0–0.7)
HEMATOCRIT: 39.8 % (ref 36.0–46.0)
HEMOGLOBIN: 13.3 g/dL (ref 12.0–15.0)
LYMPHS ABS: 2.5 10*3/uL (ref 0.7–4.0)
LYMPHS PCT: 30.4 % (ref 12.0–46.0)
MCHC: 33.4 g/dL (ref 30.0–36.0)
MCV: 82.2 fl (ref 78.0–100.0)
MONOS PCT: 6.4 % (ref 3.0–12.0)
Monocytes Absolute: 0.5 10*3/uL (ref 0.1–1.0)
Neutro Abs: 5 10*3/uL (ref 1.4–7.7)
Neutrophils Relative %: 61.7 % (ref 43.0–77.0)
Platelets: 262 10*3/uL (ref 150.0–400.0)
RBC: 4.84 Mil/uL (ref 3.87–5.11)
RDW: 14 % (ref 11.5–15.5)
WBC: 8.2 10*3/uL (ref 4.0–10.5)

## 2018-02-01 LAB — LIPID PANEL
Cholesterol: 135 mg/dL (ref 0–200)
HDL: 62.6 mg/dL (ref >39.00)
LDL CALC: 56 mg/dL (ref 0–99)
NonHDL: 72.11
TRIGLYCERIDES: 82 mg/dL (ref 0.0–149.0)
Total CHOL/HDL Ratio: 2
VLDL: 16.4 mg/dL (ref 0.0–40.0)

## 2018-02-01 LAB — URINALYSIS, ROUTINE W REFLEX MICROSCOPIC
Bilirubin Urine: NEGATIVE
Hgb urine dipstick: NEGATIVE
Ketones, ur: NEGATIVE
Nitrite: NEGATIVE
RBC / HPF: NONE SEEN (ref 0–?)
SPECIFIC GRAVITY, URINE: 1.025 (ref 1.000–1.030)
TOTAL PROTEIN, URINE-UPE24: NEGATIVE
URINE GLUCOSE: NEGATIVE
UROBILINOGEN UA: 0.2 (ref 0.0–1.0)
pH: 5.5 (ref 5.0–8.0)

## 2018-02-01 LAB — HEPATIC FUNCTION PANEL
ALBUMIN: 4.3 g/dL (ref 3.5–5.2)
ALK PHOS: 95 U/L (ref 39–117)
ALT: 13 U/L (ref 0–35)
AST: 13 U/L (ref 0–37)
Bilirubin, Direct: 0.2 mg/dL (ref 0.0–0.3)
TOTAL PROTEIN: 7.1 g/dL (ref 6.0–8.3)
Total Bilirubin: 1.1 mg/dL (ref 0.2–1.2)

## 2018-02-01 LAB — TSH: TSH: 0.77 u[IU]/mL (ref 0.35–4.50)

## 2018-02-01 LAB — BASIC METABOLIC PANEL
BUN: 16 mg/dL (ref 6–23)
CALCIUM: 10 mg/dL (ref 8.4–10.5)
CO2: 29 mEq/L (ref 19–32)
CREATININE: 1.13 mg/dL (ref 0.40–1.20)
Chloride: 103 mEq/L (ref 96–112)
GFR: 50.24 mL/min — AB (ref >60.00)
Glucose, Bld: 76 mg/dL (ref 70–99)
Potassium: 3.6 mEq/L (ref 3.5–5.1)
SODIUM: 139 meq/L (ref 135–145)

## 2018-02-01 LAB — HEMOGLOBIN A1C: Hgb A1c MFr Bld: 5.7 % (ref 4.6–6.5)

## 2018-02-01 MED ORDER — ZOLPIDEM TARTRATE 5 MG PO TABS: 5.0000 mg | ORAL_TABLET | Freq: Every evening | ORAL | 1 refills | Status: DC | PRN

## 2018-02-01 NOTE — Patient Instructions (Signed)
Please continue all other medications as before, and refills have been done if requested -the ambien  Please have the pharmacy call with any other refills you may need.  Please continue your efforts at being more active, low cholesterol diet, and weight control.  You are otherwise up to date with prevention measures today.  Please keep your appointments with your specialists as you may have planned  Please go to the LAB in the Basement (turn left off the elevator) for the tests to be done today  You will be contacted by phone if any changes need to be made immediately.  Otherwise, you will receive a letter about your results with an explanation, but please check with MyChart first.  Please remember to sign up for MyChart if you have not done so, as this will be important to you in the future with finding out test results, communicating by private email, and scheduling acute appointments online when needed.  Please return in 1 year for your yearly visit, or sooner if needed, with Lab testing done 3-5 days before

## 2018-02-01 NOTE — Progress Notes (Signed)
Subjective:    Patient ID: Renee [name], female    DOB: [birth year], 72 y.o.   MRN: [id number]  HPI Here for wellness and f/u;  Overall doing ok;  Pt denies Chest pain, worsening SOB, DOE, wheezing, orthopnea, PND, worsening LE edema, palpitations, dizziness or syncope.  Pt denies neurological change such as new headache, facial or extremity weakness.  Pt denies polydipsia, polyuria, or low sugar symptoms. Pt states overall good compliance with treatment and medications, good tolerability, and has been trying to follow appropriate diet.  Pt denies worsening depressive symptoms, suicidal ideation or panic. No fever, night sweats, wt loss, loss of appetite, or other constitutional symptoms.  Pt states good ability with ADL's, has low fall risk, home safety reviewed and adequate, no other significant changes in hearing or vision, and only occasionally active with exercise.  Has had increased stress in last few yrs; Mother died 55, then sister died 10/04/14, had PPM placed 10-04-2015, dog died in 03-Oct-2016, and another dog died earlier this yr.  C/o increased stress.  Past Medical History:  Diagnosis Date  . Anxiety   . AV nodal re-entry tachycardia [facility])    a. s/p RFA 10-04-91- Dr. Rolland Porter, [place]  . Broken neck ([place])    1993  . Cancer (HCC)    hx of cervical   ca  . Chronic chest pain    a. 2003/10/04 Cath: Mild nonobstructive plaque (30-40% LCX);  b. 10/04/11 low risk myoview;  c. 09/2011 Coronary CT: NL cors w ca score of 0.  . Chronic pain syndrome   . Complication of anesthesia    blood pressure and heart dropped in 10-04-2007    . Concussion    1979, 1993  . Degenerative joint disease    a. s/p L Total Knee Arthroplasty.  . Diet-controlled type 2 diabetes mellitus ([place])   . Diverticulosis of colon   . Duodenitis without hemorrhage   . Dysrhythmia    REPORTS , SINCE PLACEMENT OF PACEMAKER, STILL CAN FEEL HEART " GOING IN AND OUT OF RYTHYN" REPORTS, YESTERDAY HER BP SYSTOLIC WAS IN THE 510C AND WAS BREATHLESS, HR 112,  SWEATING ; DENIES SYNCOPE  , DENIES CHEST PAIN   . Fibromyalgia   . GERD (gastroesophageal reflux disease)   . Hemorrhoids   . Hypertension   . Irritable bowel syndrome   . Long QT interval    a. mild - advised to avoid meds that may prolong QT.  Marland Kitchen Neuropathy   . Osteoporosis   . Paroxysmal A-fib (HCC)    PER PATIENT. I CAN FEEL MY HEART RACING WHEN IM IN IT   . SVT (supraventricular tachycardia) (HCC)    a. nonsustained SVT - previously offered flecainide but refused;  b. 12/2012 30 day event monitor w/o significant arrhythmias.  . Thyroid nodule    Past Surgical History:  Procedure Laterality Date  . ABLATION OF DYSRHYTHMIC FOCUS  1993  . ANTERIOR AND POSTERIOR REPAIR N/A 08/01/2017   Procedure: ANTERIOR (CYSTOCELE) AND POSTERIOR REPAIR (RECTOCELE);  Surgeon: Bjorn Loser, MD;  Location: [facility];  Service: Urology;  Laterality: N/A;  . BILATERAL SALPINGECTOMY Left 08/01/2017   Procedure: SALPINGECTOMY;  Surgeon: Bobbye Charleston, MD;  Location: [facility];  Service: Gynecology;  Laterality: Left;  . BIOPSY THYROID    . CARDIAC ELECTROPHYSIOLOGY STUDY AND ABLATION     atrioventricular nodal reentant tachycardia  . CHOLECYSTECTOMY  1980  . COLONOSCOPY  06/25/2008   Diverticulosis and Hemorrhoids  . COLPOSCOPY    .  CORONARY ANGIOPLASTY  2007  . CYSTOSCOPY N/A 08/01/2017   Procedure: CYSTOSCOPY;  Surgeon: Bjorn Loser, MD;  Location: [facility];  Service: Urology;  Laterality: N/A;  . EP IMPLANTABLE DEVICE N/A 03/03/2016   Procedure: Pacemaker Implant;  Surgeon: Evans Lance, MD;  Location: [place] CV LAB;  Service: Cardiovascular;  Laterality: N/A;  . ESOPHAGOGASTRODUODENOSCOPY    . HYSTEROSCOPY  2002   with resection of endometrial polyps. by Dr.MCPhail  . HYSTEROSCOPY W/D&C N/A 02/05/2015   Procedure: DILATATION AND CURETTAGE /HYSTEROSCOPY;  Surgeon: Vanessa Kick, MD;  Location: [place] [facility];  Service: Gynecology;  Laterality: N/A;  . LOOP RECORDER IMPLANT  08/21/2013   MDT LinQ  implanted by Dr Lovena Le for syncope  . LOOP RECORDER IMPLANT N/A 08/21/2013   Procedure: LOOP RECORDER IMPLANT;  Surgeon: Evans Lance, MD;  Location: [facility] CATH LAB;  Service: Cardiovascular;  Laterality: N/A;  . ORIF RADIAL HEAD / NECK FRACTURE  09/05/88  . TILT TABLE STUDY N/A 05/15/2013   Procedure: TILT TABLE STUDY;  Surgeon: Deboraha Sprang, MD;  Location: [facility] CATH LAB;  Service: Cardiovascular;  Laterality: N/A;  . TOTAL HIP ARTHROPLASTY  10/10   Left, at [place] N/A 08/01/2017   Procedure: HYSTERECTOMY VAGINAL;  Surgeon: Bobbye Charleston, MD;  Location: [facility];  Service: Gynecology;  Laterality: N/A;    reports that she has never smoked. She has never used smokeless tobacco. She reports that she drinks alcohol. She reports that she does not use drugs. family history includes Asthma in her mother and sister; Diabetes in her brother, mother, and sister; Heart disease in her sister and sister; Lung cancer in her father; Ovarian cancer in her mother; Stroke in her mother. Allergies  Allergen Reactions  . Cyclobenzaprine Other (See Comments)    Pt states that this medication does not work for her.    . Azithromycin Other (See Comments)    Cannot take due to prolonged QT  . Adhesive [Tape] Itching, Rash and Other (See Comments)    Reaction:  Blisters  Pt states that she is allergic to the adhesive backing of EKG pads if left on for more than 24 hours Patient states she tolerates paper tape ok  . Moxifloxacin Other (See Comments)    Unknown allergic reaction many years ago  . Quinolones Other (See Comments)    Unknown allergic reaction many years ago   Current Outpatient Medications on File Prior to Visit  Medication Sig Dispense Refill  . ALPRAZolam (XANAX) 0.5 MG tablet Take 0.5-1 tablets (0.25-0.5 mg total) by mouth 2 (two) times daily as needed for anxiety. 90 tablet 1  . cetirizine (ZYRTEC) 10 MG tablet Take 10 mg by mouth daily as needed for allergies.     Marland Kitchen diltiazem (CARDIZEM CD) 120 MG 24 hr capsule TAKE 1 CAPSULE BY MOUTH  EVERY MORNING 90 capsule 2  . flecainide (TAMBOCOR) 100 MG tablet Take 1 tablet (100 mg total) by mouth 2 (two) times daily. 60 tablet 11  . glucose blood (ACCU-CHEK SMARTVIEW) test strip 1 each by Other route 2 (two) times daily. Use to check blood sugars twice a day Dx e11.9 300 each 2  . Lancets (ACCU-CHEK MULTICLIX) lancets 1 each by Other route 2 (two) times daily. Use to help check blood sugars twice a day Dx e11.9 1 each 0  . nitroGLYCERIN (NITROSTAT) 0.4 MG SL tablet Place 1 tablet (0.4 mg total) under the tongue every 5 (five) minutes as needed for  chest pain. 30 tablet 0  . omeprazole (PRILOSEC) 40 MG capsule Take 1 capsule (40 mg total) by mouth every evening. 90 capsule 2  . potassium chloride (MICRO-K) 10 MEQ CR capsule Take 1 capsule (10 mEq total) by mouth daily. 30 capsule 11  . sodium chloride (OCEAN) 0.65 % SOLN nasal spray Place 1-2 sprays into both nostrils 4 (four) times daily as needed for congestion.    . triamterene-hydrochlorothiazide (DYAZIDE) 37.5-25 MG capsule Take 1 each (1 capsule total) by mouth daily. 30 capsule 11  . zolpidem (AMBIEN) 5 MG tablet Take 0.5-1 tablets (2.5-5 mg total) by mouth at bedtime as needed for sleep. 30 tablet 1   No current facility-administered medications on file prior to visit.    Review of Systems Constitutional: Negative for other unusual diaphoresis, sweats, appetite or weight changes HENT: Negative for other worsening hearing loss, ear pain, facial swelling, mouth sores or neck stiffness.   Eyes: Negative for other worsening pain, redness or other visual disturbance.  Respiratory: Negative for other stridor or swelling Cardiovascular: Negative for other palpitations or other chest pain  Gastrointestinal: Negative for worsening diarrhea or loose stools, blood in stool, distention or other pain Genitourinary: Negative for hematuria, flank pain or other change in  urine volume.  Musculoskeletal: Negative for myalgias or other joint swelling.  Skin: Negative for other color change, or other wound or worsening drainage.  Neurological: Negative for other syncope or numbness. Hematological: Negative for other adenopathy or swelling Psychiatric/Behavioral: Negative for hallucinations, other worsening agitation, SI, self-injury, or new decreased concentration All other system neg per pt    Objective:   Physical Exam BP 140/80 (BP Location: Left Arm, Patient Position: Sitting, Cuff Size: Normal)   Pulse 70   Temp 98.4 F (36.9 C) (Oral)   Ht 5\' 3"  (1.6 m)   Wt 163 lb 12.8 oz (74.3 kg)   SpO2 98%   BMI 29.02 kg/m  VS noted,  Constitutional: Pt is oriented to person, place, and time. Appears well-developed and well-nourished, in no significant distress and comfortable Head: Normocephalic and atraumatic  Eyes: Conjunctivae and EOM are normal. Pupils are equal, round, and reactive to light Right Ear: External ear normal without discharge Left Ear: External ear normal without discharge Nose: Nose without discharge or deformity Mouth/Throat: Oropharynx is without other ulcerations and moist  Neck: Normal range of motion. Neck supple. No JVD present. No tracheal deviation present or significant neck LA or mass Cardiovascular: Normal rate, regular rhythm, normal heart sounds and intact distal pulses.   Pulmonary/Chest: WOB normal and breath sounds without rales or wheezing  Abdominal: Soft. Bowel sounds are normal. NT. No HSM  Musculoskeletal: Normal range of motion. Exhibits no edema Lymphadenopathy: Has no other cervical adenopathy.  Neurological: Pt is alert and oriented to person, place, and time. Pt has normal reflexes. No cranial nerve deficit. Motor grossly intact, Gait intact Skin: Skin is warm and dry. No rash noted or new ulcerations Psychiatric:  Has nervous mood and affect. Behavior is normal without agitation' No other exam findings  Lab  Results  Component Value Date   WBC 6.0 07/31/2017   HGB 11.3 (L) 08/02/2017   HCT 34.4 (L) 08/02/2017   PLT 258 07/31/2017   GLUCOSE 111 (H) 08/02/2017   CHOL 146 01/31/2017   TRIG 174.0 (H) 01/31/2017   HDL 61.60 01/31/2017   LDLCALC 50 01/31/2017   ALT 14 01/31/2017   AST 17 01/31/2017   NA 139 08/02/2017   K 3.7  08/02/2017   CL 104 08/02/2017   CREATININE 0.70 08/02/2017   BUN 13 08/02/2017   CO2 26 08/02/2017   TSH 0.85 01/31/2017   INR 0.92 07/25/2017   HGBA1C 5.6 07/25/2017       Assessment & Plan:

## 2018-02-07 ENCOUNTER — Ambulatory Visit (INDEPENDENT_AMBULATORY_CARE_PROVIDER_SITE_OTHER): Payer: Medicare Other | Admitting: *Deleted

## 2018-02-07 DIAGNOSIS — I495 Sick sinus syndrome: Secondary | ICD-10-CM | POA: Diagnosis not present

## 2018-02-07 NOTE — Progress Notes (Signed)
Remote pacemaker transmission.   

## 2018-02-14 DIAGNOSIS — H2513 Age-related nuclear cataract, bilateral: Secondary | ICD-10-CM | POA: Diagnosis not present

## 2018-02-14 DIAGNOSIS — H52223 Regular astigmatism, bilateral: Secondary | ICD-10-CM | POA: Diagnosis not present

## 2018-02-15 ENCOUNTER — Ambulatory Visit: Payer: Medicare Other | Admitting: Podiatry

## 2018-02-15 DIAGNOSIS — N8111 Cystocele, midline: Secondary | ICD-10-CM | POA: Diagnosis not present

## 2018-02-15 DIAGNOSIS — R350 Frequency of micturition: Secondary | ICD-10-CM | POA: Diagnosis not present

## 2018-02-19 ENCOUNTER — Other Ambulatory Visit: Payer: Self-pay | Admitting: Internal Medicine

## 2018-02-20 LAB — CUP PACEART REMOTE DEVICE CHECK
Battery Remaining Percentage: 80 %
Brady Statistic AP VP Percent: 1 %
Brady Statistic AS VP Percent: 0 %
Brady Statistic RA Percent Paced: 79 %
Brady Statistic RV Percent Paced: 1 %
Date Time Interrogation Session: 20190910054116
Implantable Lead Implant Date: 20170921
Implantable Lead Location: 753859
Implantable Lead Location: 753860
Implantable Lead Model: 377
Implantable Lead Serial Number: 49580836
Lead Channel Impedance Value: 503 Ohm
Lead Channel Pacing Threshold Amplitude: 0.5 V
Lead Channel Pacing Threshold Pulse Width: 0.4 ms
Lead Channel Setting Pacing Amplitude: 1.3 V
MDC IDC LEAD IMPLANT DT: 20170921
MDC IDC LEAD SERIAL: 49575519
MDC IDC MSMT LEADCHNL RV IMPEDANCE VALUE: 541 Ohm
MDC IDC MSMT LEADCHNL RV PACING THRESHOLD AMPLITUDE: 0.8 V
MDC IDC MSMT LEADCHNL RV PACING THRESHOLD PULSEWIDTH: 0.4 ms
MDC IDC PG IMPLANT DT: 20170921
MDC IDC SET LEADCHNL RA PACING AMPLITUDE: 1.6 V
MDC IDC SET LEADCHNL RV PACING PULSEWIDTH: 0.4 ms
MDC IDC STAT BRADY AP VS PERCENT: 78 %
MDC IDC STAT BRADY AS VS PERCENT: 21 %
Pulse Gen Model: 407145
Pulse Gen Serial Number: 68850891

## 2018-02-21 ENCOUNTER — Telehealth: Payer: Self-pay

## 2018-02-21 MED ORDER — BLOOD GLUCOSE MONITOR KIT: PACK | 0 refills | Status: AC

## 2018-02-21 NOTE — Telephone Encounter (Signed)
Faxed to [employer]

## 2018-02-21 NOTE — Telephone Encounter (Signed)
Copied from [place] [phone]. Topic: General - Other >> Feb 21, 2018 11:05 AM Keene Breath wrote: Reason for CRM: Patient called to inform doctor that her insurance company said that her blood sugar machine is out dated and she needs to get an updated machine with the strips.  Patient stated that she is diabetic and needs this machine as soon as possible.  CB# [phone].

## 2018-02-21 NOTE — Telephone Encounter (Signed)
Done erx to shirron due to unable to erx this one

## 2018-02-21 NOTE — Addendum Note (Signed)
Addended by: Biagio Borg on: 02/21/2018 01:04 PM   Modules accepted: Orders

## 2018-02-26 ENCOUNTER — Ambulatory Visit
Admission: RE | Admit: 2018-02-26 | Discharge: 2018-02-26 | Disposition: A | Discharge status: Home / Self Care | Payer: Medicare Other | Source: Ambulatory Visit | Attending: Internal Medicine | Admitting: Internal Medicine

## 2018-02-26 DIAGNOSIS — Z1231 Encounter for screening mammogram for malignant neoplasm of breast: Secondary | ICD-10-CM | POA: Diagnosis not present

## 2018-02-26 DIAGNOSIS — R109 Unspecified abdominal pain: Secondary | ICD-10-CM | POA: Diagnosis not present

## 2018-02-26 DIAGNOSIS — R350 Frequency of micturition: Secondary | ICD-10-CM | POA: Diagnosis not present

## 2018-03-26 ENCOUNTER — Ambulatory Visit (HOSPITAL_COMMUNITY)
Admission: RE | Admit: 2018-03-26 | Discharge: 2018-03-26 | Disposition: A | Discharge status: Home / Self Care | Payer: Medicare Other | Source: Ambulatory Visit | Attending: Cardiology | Admitting: Cardiology

## 2018-03-26 VITALS — BP 134/82 | HR 69 | Wt 159.4 lb

## 2018-03-26 DIAGNOSIS — I10 Essential (primary) hypertension: Secondary | ICD-10-CM | POA: Insufficient documentation

## 2018-03-26 DIAGNOSIS — R55 Syncope and collapse: Secondary | ICD-10-CM | POA: Diagnosis not present

## 2018-03-26 DIAGNOSIS — I471 Supraventricular tachycardia: Secondary | ICD-10-CM | POA: Insufficient documentation

## 2018-03-26 DIAGNOSIS — I495 Sick sinus syndrome: Secondary | ICD-10-CM | POA: Diagnosis not present

## 2018-03-26 DIAGNOSIS — R079 Chest pain, unspecified: Secondary | ICD-10-CM | POA: Insufficient documentation

## 2018-03-26 DIAGNOSIS — Z79899 Other long term (current) drug therapy: Secondary | ICD-10-CM | POA: Insufficient documentation

## 2018-03-26 DIAGNOSIS — Z950 Presence of cardiac pacemaker: Secondary | ICD-10-CM | POA: Diagnosis not present

## 2018-03-26 DIAGNOSIS — I4581 Long QT syndrome: Secondary | ICD-10-CM | POA: Insufficient documentation

## 2018-03-26 DIAGNOSIS — I480 Paroxysmal atrial fibrillation: Secondary | ICD-10-CM | POA: Insufficient documentation

## 2018-03-26 MED ORDER — DILTIAZEM HCL ER COATED BEADS 240 MG PO CP24: 240.0000 mg | ORAL_CAPSULE | Freq: Every morning | ORAL | 2 refills | Status: DC

## 2018-03-26 NOTE — Patient Instructions (Signed)
Increase Diltiazem to 240 mg daily  We will contact you in 6 months to schedule your next appointment.
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
## 2018-03-28 ENCOUNTER — Telehealth: Payer: Self-pay | Admitting: Internal Medicine

## 2018-03-28 DIAGNOSIS — I480 Paroxysmal atrial fibrillation: Secondary | ICD-10-CM

## 2018-03-28 NOTE — Telephone Encounter (Signed)
Spoke with patient she is aware that Dr.McLean will call her to discuss any concerns she has with her EKG.

## 2018-03-28 NOTE — Telephone Encounter (Signed)
New Message   Patient is calling because she has some questions about EKG that she recently had. She says when she was in the office she addressed her concerns and she felt that she was rushed. She says she reviewed her past EKG and she sees a difference. She has major concern and she has not feeling well.

## 2018-03-28 NOTE — Telephone Encounter (Signed)
Dr Aundra Dubin is very aware of this situation, he reviewed pt's EKG at her visit and compared it to her previous EKG and advised it was ok, pt was advised of this at her appointment.  Dr Aundra Dubin is aware pt is calling back to discuss further and states he will call pt to discuss tomorrow.

## 2018-03-28 NOTE — Telephone Encounter (Signed)
Routed to Dr.McLean   Damian Leavell, RN routed conversation to [facility] Triage Pool 44 minutes ago (9:21 AM)    Dairl Ponder L routed conversation to Damian Leavell, RN 56 minutes ago (9:10 AM)    Jimmye Norman, Avaletta L 59 minutes ago (9:07 AM)      New Message   Patient is calling because she has some questions about EKG that she recently had. She says when she was in the office she addressed her concerns and she felt that she was rushed. She says she reviewed her past EKG and she sees a difference. She has major concern and she has not feeling well.

## 2018-03-29 ENCOUNTER — Other Ambulatory Visit (HOSPITAL_COMMUNITY): Payer: Self-pay | Admitting: *Deleted

## 2018-03-29 DIAGNOSIS — R0609 Other forms of dyspnea: Principal | ICD-10-CM

## 2018-03-29 NOTE — Addendum Note (Signed)
Encounter addended by: Larey Dresser, MD on: 03/29/2018 3:38 PM  Actions taken: Sign clinical note

## 2018-03-30 NOTE — Telephone Encounter (Signed)
Patient called back to clinic to talk to me again today.  She says that her dyspnea with exertion has been getting worse and she is very concerned about it.  I will set her up for a coronary CT angiogram to assess for significant CAD.   Renee [name] 03/29/2018   Orders placed for CT and bmet to be done prior

## 2018-04-06 ENCOUNTER — Other Ambulatory Visit: Payer: Self-pay

## 2018-04-06 MED ORDER — OMEPRAZOLE 40 MG PO CPDR: 40.0000 mg | DELAYED_RELEASE_CAPSULE | Freq: Every evening | ORAL | 2 refills | Status: DC

## 2018-04-11 ENCOUNTER — Other Ambulatory Visit: Payer: Self-pay | Admitting: Internal Medicine

## 2018-04-12 ENCOUNTER — Encounter (HOSPITAL_COMMUNITY): Payer: Medicare Other | Admitting: Cardiology

## 2018-04-13 ENCOUNTER — Ambulatory Visit (HOSPITAL_COMMUNITY)
Admission: RE | Admit: 2018-04-13 | Discharge: 2018-04-13 | Disposition: A | Discharge status: Home / Self Care | Payer: Medicare Other | Source: Ambulatory Visit | Attending: Cardiology | Admitting: Cardiology

## 2018-04-13 ENCOUNTER — Ambulatory Visit (HOSPITAL_COMMUNITY): Payer: Medicare Other

## 2018-04-13 ENCOUNTER — Telehealth: Payer: Self-pay | Admitting: Cardiology

## 2018-04-13 DIAGNOSIS — R0609 Other forms of dyspnea: Secondary | ICD-10-CM | POA: Insufficient documentation

## 2018-04-13 DIAGNOSIS — Z950 Presence of cardiac pacemaker: Secondary | ICD-10-CM | POA: Insufficient documentation

## 2018-04-13 DIAGNOSIS — I480 Paroxysmal atrial fibrillation: Secondary | ICD-10-CM | POA: Diagnosis not present

## 2018-04-13 LAB — BASIC METABOLIC PANEL
Anion gap: 6 (ref 5–15)
BUN: 16 mg/dL (ref 8–23)
CALCIUM: 10 mg/dL (ref 8.9–10.3)
CO2: 27 mmol/L (ref 22–32)
Chloride: 105 mmol/L (ref 98–111)
Creatinine, Ser: 0.87 mg/dL (ref 0.44–1.00)
GFR calc Af Amer: >60 mL/min (ref >60)
GLUCOSE: 91 mg/dL (ref 70–99)
POTASSIUM: 3.7 mmol/L (ref 3.5–5.1)
Sodium: 138 mmol/L (ref 135–145)

## 2018-04-13 MED ORDER — IOPAMIDOL (ISOVUE-370) INJECTION 76%
100.0000 mL | Freq: Once | INTRAVENOUS | Status: AC | PRN
  Administered 2018-04-13: 100 mL via INTRAVENOUS

## 2018-04-13 MED ORDER — NITROGLYCERIN 0.4 MG SL SUBL
SUBLINGUAL_TABLET | SUBLINGUAL | Status: AC
  Filled 2018-04-13: qty 2

## 2018-04-13 MED ORDER — NITROGLYCERIN 0.4 MG SL SUBL
0.8000 mg | SUBLINGUAL_TABLET | Freq: Once | SUBLINGUAL | Status: AC
  Administered 2018-04-13: 0.8 mg via SUBLINGUAL
  Filled 2018-04-13: qty 25

## 2018-04-13 NOTE — Addendum Note (Signed)
Addended by: Drucilla Schmidt on: 04/13/2018 10:26 AM   Modules accepted: Orders

## 2018-04-13 NOTE — Telephone Encounter (Signed)
Pt called in with "concerns" about the procedure that she had done today, cardiac CTA. She was not given adequate instructions for the procedure including to be NPO and she had eaten and drank tea. While she was getting her scan she says that one nurse Gershon Mussel) asked the the other Legrand Como)  "what dose did you give her" twice and this concerned the pt. She felt that something was being done wrong. She says that they were talking behind her back. She said that the whole procedure was a nightmare to her.   She was told that her pacemaker would need to be turned off for the procedure but was not.   I explained the procedure to her and what needed to be done to accomplish the test, which came out really good, but she is unhappy with what occurred today.   Gershon Mussel was the nurse and Legrand Como was also there.   I will route this to Dr. Aundra Dubin.

## 2018-04-19 ENCOUNTER — Ambulatory Visit: Payer: Self-pay

## 2018-04-19 NOTE — Telephone Encounter (Signed)
Reports she started out with allergy symptoms 3 weeks ago. "It has gotten worse.I have a bad cough now and I'm coughing up white to yellow looking mucus." Denies any fever or wheezing. Reports gets "very short of breath" with exertion. Has already scheduled an appointment with her provider for tomorrow. No availability for today.Reports she is eating and drinking well. Instructed if symptoms worsened to go to ED. Verbalizes understanding. Answer Assessment - Initial Assessment Questions 1. ONSET: "When did the cough begin?"      3 weeks ago 2. SEVERITY: "How bad is the cough today?"      Severe 3. RESPIRATORY DISTRESS: "Describe your breathing."      It is getting worse 4. FEVER: "Do you have a fever?" If so, ask: "What is your temperature, how was it measured, and when did it start?"     No 5. SPUTUM: "Describe the color of your sputum" (clear, white, yellow, green)     Yellow 6. HEMOPTYSIS: "Are you coughing up any blood?" If so ask: "How much?" (flecks, streaks, tablespoons, etc.)     No 7. CARDIAC HISTORY: "Do you have any history of heart disease?" (e.g., heart attack, congestive heart failure)      Pacemaker 8. LUNG HISTORY: "Do you have any history of lung disease?"  (e.g., pulmonary embolus, asthma, emphysema)     No 9. PE RISK FACTORS: "Do you have a history of blood clots?" (or: recent major surgery, recent prolonged travel, bedridden)     No 10. OTHER SYMPTOMS: "Do you have any other symptoms?" (e.g., runny nose, wheezing, chest pain)       Runny nose 11. PREGNANCY: "Is there any chance you are pregnant?" "When was your last menstrual period?"       No 12. TRAVEL: "Have you traveled out of the country in the last month?" (e.g., travel history, exposures)       No  Protocols used: [place]

## 2018-04-19 NOTE — Telephone Encounter (Signed)
Noted  

## 2018-04-19 NOTE — Telephone Encounter (Signed)
Returned call to patient. Left VM to return call to office. 

## 2018-04-20 ENCOUNTER — Ambulatory Visit (INDEPENDENT_AMBULATORY_CARE_PROVIDER_SITE_OTHER): Payer: Medicare Other | Admitting: Internal Medicine

## 2018-04-20 ENCOUNTER — Encounter: Payer: Self-pay | Admitting: Internal Medicine

## 2018-04-20 VITALS — BP 142/90 | HR 107 | Temp 98.2°F | Ht 63.0 in | Wt 162.0 lb

## 2018-04-20 DIAGNOSIS — R739 Hyperglycemia, unspecified: Secondary | ICD-10-CM

## 2018-04-20 DIAGNOSIS — J309 Allergic rhinitis, unspecified: Secondary | ICD-10-CM | POA: Diagnosis not present

## 2018-04-20 DIAGNOSIS — J019 Acute sinusitis, unspecified: Secondary | ICD-10-CM | POA: Diagnosis not present

## 2018-04-20 DIAGNOSIS — J3089 Other allergic rhinitis: Secondary | ICD-10-CM | POA: Insufficient documentation

## 2018-04-20 MED ORDER — CEFUROXIME AXETIL 250 MG PO TABS: 250.0000 mg | ORAL_TABLET | Freq: Two times a day (BID) | ORAL | 0 refills | Status: AC

## 2018-04-20 MED ORDER — BENZONATATE 100 MG PO CAPS: ORAL_CAPSULE | ORAL | 1 refills | Status: DC

## 2018-04-20 MED ORDER — TRIAMCINOLONE ACETONIDE 55 MCG/ACT NA AERO: 2.0000 | INHALATION_SPRAY | Freq: Every day | NASAL | 12 refills | Status: DC

## 2018-04-20 MED ORDER — PREDNISONE 10 MG PO TABS: ORAL_TABLET | ORAL | 0 refills | Status: DC

## 2018-04-20 NOTE — Progress Notes (Signed)
Subjective:    Patient ID: Renee [name], female    DOB: 08/07/1945, 72 y.o.   MRN: [id number]  HPI   Here with 2-3 days acute onset fever, facial pain, pressure, headache, general weakness and malaise, and greenish d/c, with mild ST and cough, but pt denies chest pain, wheezing, increased sob or doe, orthopnea, PND, increased LE swelling, palpitations, dizziness or syncope.  ALso Does have several wks ongoing nasal allergy symptoms with clearish congestion, itch and sneezing, without fever, pain, ST, cough, swelling or wheezing.  Not taking Zyrtec and doesn't want to start after she read on facebook one her friends saw somewhere it causes cancer.  Pt denies new neurological symptoms such as new headache, or facial or extremity weakness or numbness   Pt denies polydipsia, polyuria Past Medical History:  Diagnosis Date  . Anxiety   . AV nodal re-entry tachycardia [facility])    a. s/p RFA 1993- Dr. Rolland Porter, [place]  . Broken neck ([place])    1993  . Cancer (HCC)    hx of cervical   ca  . Chronic chest pain    a. 2005 Cath: Mild nonobstructive plaque (30-40% LCX);  b. 08/2011 low risk myoview;  c. 09/2011 Coronary CT: NL cors w ca score of 0.  . Chronic pain syndrome   . Complication of anesthesia    blood pressure and heart dropped in 2009    . Concussion    1979, 1993  . Degenerative joint disease    a. s/p L Total Knee Arthroplasty.  . Diet-controlled type 2 diabetes mellitus ([place])   . Diverticulosis of colon   . Duodenitis without hemorrhage   . Dysrhythmia    REPORTS , SINCE PLACEMENT OF PACEMAKER, STILL CAN FEEL HEART " GOING IN AND OUT OF R[facility]THYN" REPORTS, YESTERDAY HER BP SYSTOLIC WAS IN THE 825O AND WAS BREATHLESS, HR 112, SWEATING ; DENIES SYNCOPE  , DENIES CHEST PAIN   . Fibromyalgia   . GERD (gastroesophageal reflux disease)   . Hemorrhoids   . Hypertension   . Irritable bowel syndrome   . Long QT interval    a. mild - advised to avoid meds that may prolong QT.  Marland Kitchen Neuropathy   .  Osteoporosis   . Paroxysmal A-fib (HCC)    PER PATIENT. I CAN FEEL MY HEART RACING WHEN IM IN IT   . SVT (supraventricular tachycardia) (HCC)    a. nonsustained SVT - previously offered flecainide but refused;  b. 12/2012 30 day event monitor w/o significant arrhythmias.  . Thyroid nodule    Past Surgical History:  Procedure Laterality Date  . ABLATION OF DYSRHYTHMIC FOCUS  1993  . ANTERIOR AND POSTERIOR REPAIR N/A 08/01/2017   Procedure: ANTERIOR (CYSTOCELE) AND POSTERIOR REPAIR (RECTOCELE);  Surgeon: Bjorn Loser, MD;  Location: [facility];  Service: Urology;  Laterality: N/A;  . BILATERAL SALPINGECTOMY Left 08/01/2017   Procedure: SALPINGECTOMY;  Surgeon: Bobbye Charleston, MD;  Location: [facility];  Service: Gynecology;  Laterality: Left;  . BIOPSY THYROID    . CARDIAC ELECTROPHYSIOLOGY STUDY AND ABLATION     atrioventricular nodal reentant tachycardia  . CHOLECYSTECTOMY  1980  . COLONOSCOPY  06/25/2008   Diverticulosis and Hemorrhoids  . COLPOSCOPY    . CORONARY ANGIOPLASTY  2007  . CYSTOSCOPY N/A 08/01/2017   Procedure: CYSTOSCOPY;  Surgeon: Bjorn Loser, MD;  Location: [facility];  Service: Urology;  Laterality: N/A;  . EP IMPLANTABLE DEVICE N/A 03/03/2016   Procedure: Pacemaker Implant;  Surgeon: Carleene Overlie  Peyton Najjar, MD;  Location: [place] [facility];  Service: Cardiovascular;  Laterality: N/A;  . ESOPHAGOGASTRODUODENOSCOPY    . HYSTEROSCOPY  2002   with resection of endometrial polyps. by Dr.MCPhail  . HYSTEROSCOPY W/D&C N/A 02/05/2015   Procedure: DILATATION AND CURETTAGE /HYSTEROSCOPY;  Surgeon: Vanessa Kick, MD;  Location: [facility];  Service: Gynecology;  Laterality: N/A;  . LOOP RECORDER IMPLANT  08/21/2013   MDT LinQ implanted by Dr Lovena Le for syncope  . LOOP RECORDER IMPLANT N/A 08/21/2013   Procedure: LOOP RECORDER IMPLANT;  Surgeon: Evans Lance, MD;  Location: [facility] C[facility];  Service: Cardiovascular;  Laterality: N/A;  . ORIF RADIAL HEAD / NECK FRACTURE  09/05/88  . TILT TABLE  STUDY N/A 05/15/2013   Procedure: TILT TABLE STUDY;  Surgeon: Deboraha Sprang, MD;  [facility];  Service: Cardiovascular;  Laterality: N/A;  . TOTAL HIP ARTHROPLASTY  10/10   Left, at [place] N/A 08/01/2017   Procedure: HYSTERECTOMY VAGINAL;  Surgeon: Bobbye Charleston, MD;  Location: [facility];  Service: Gynecology;  Laterality: N/A;    reports that she has never smoked. She has never used smokeless tobacco. She reports that she drinks alcohol. She reports that she does not use drugs. family history includes Asthma in her mother and sister; Diabetes in her brother, mother, and sister; Heart disease in her sister and sister; Lung cancer in her father; Ovarian cancer in her mother; Stroke in her mother. Allergies  Allergen Reactions  . Cyclobenzaprine Other (See Comments)    Pt states that this medication does not work for her.    . Azithromycin Other (See Comments)    Cannot take due to prolonged QT  . Nitrofurantoin Other (See Comments)  . Adhesive [Tape] Itching, Rash and Other (See Comments)    Reaction:  Blisters  Pt states that she is allergic to the adhesive backing of EKG pads if left on for more than 24 hours Patient states she tolerates paper tape ok  . Moxifloxacin Other (See Comments)    Unknown allergic reaction many years ago  . Quinolones Other (See Comments)    Unknown allergic reaction many years ago   Current Outpatient Medications on File Prior to Visit  Medication Sig Dispense Refill  . ACCU-CHEK FASTCLIX LANCETS MISC USE UP TO 4 TIMES DAILY AS  DIRECTED 408 each 1  . ACCU-CHEK GUIDE test strip USE UP TO 4 TIMES DAILY AS  DIRECTED 100 each 1  . ALPRAZolam (XANAX) 0.5 MG tablet Take 0.5-1 tablets (0.25-0.5 mg total) by mouth 2 (two) times daily as needed for anxiety. 90 tablet 1  . blood glucose meter kit and supplies KIT Dispense per patient and insurance preferred. Use up to four times daily as directed. E11.9 1 each 0  .  cetirizine (ZYRTEC) 10 MG tablet Take 10 mg by mouth daily as needed for allergies.    Marland Kitchen diltiazem (CARDIZEM CD) 240 MG 24 hr capsule Take 1 capsule (240 mg total) by mouth every morning. 90 capsule 2  . flecainide (TAMBOCOR) 100 MG tablet Take 1 tablet (100 mg total) by mouth 2 (two) times daily. 60 tablet 11  . nitroGLYCERIN (NITROSTAT) 0.4 MG SL tablet Place 1 tablet (0.4 mg total) under the tongue every 5 (five) minutes as needed for chest pain. 30 tablet 0  . omeprazole (PRILOSEC) 40 MG capsule Take 1 capsule (40 mg total) by mouth every evening. 90 capsule 2  . potassium chloride (MICRO-K) 10  MEQ CR capsule TAKE 1 CAPSULE BY MOUTH  DAILY. 90 capsule 1  . sodium chloride (OCEAN) 0.65 % SOLN nasal spray Place 1-2 sprays into both nostrils 4 (four) times daily as needed for congestion.    . triamterene-hydrochlorothiazide (DYAZIDE) 37.5-25 MG capsule Take 1 each (1 capsule total) by mouth daily. 30 capsule 11  . zolpidem (AMBIEN) 5 MG tablet Take 1 tablet (5 mg total) by mouth at bedtime as needed for sleep. 90 tablet 1   No current facility-administered medications on file prior to visit.    Review of Systems  Constitutional: Negative for other unusual diaphoresis or sweats HENT: Negative for ear discharge or swelling Eyes: Negative for other worsening visual disturbances Respiratory: Negative for stridor or other swelling  Gastrointestinal: Negative for worsening distension or other blood Genitourinary: Negative for retention or other urinary change Musculoskeletal: Negative for other MSK pain or swelling Skin: Negative for color change or other new lesions Neurological: Negative for worsening tremors and other numbness  Psychiatric/Behavioral: Negative for worsening agitation or other fatigue All other system neg per pt    Objective:   Physical Exam BP (!) 142/90   Pulse (!) 107   Temp 98.2 F (36.8 C) (Oral)   Ht '5\' 3"'$  (1.6 m)   Wt 162 lb (73.5 kg)   SpO2 97%   BMI 28.70  kg/m  VS noted, mild ill Constitutional: Pt appears in NAD HENT: Head: NCAT.  Right Ear: External ear normal.  Left Ear: External ear normal.  Bilat tm's with mild erythema.  Max sinus areas mild tender.  Pharynx with mild erythema, no exudate Eyes: . Pupils are equal, round, and reactive to light. Conjunctivae and EOM are normal Nose: without d/c or deformity Neck: Neck supple. Gross normal ROM Cardiovascular: Normal rate and regular rhythm.   Pulmonary/Chest: Effort normal and breath sounds without rales or wheezing.  Neurological: Pt is alert. At baseline orientation, motor grossly intact Skin: Skin is warm. No rashes, other new lesions, no LE edema Psychiatric: Pt behavior is normal without agitation  No other exam findings Lab Results  Component Value Date   WBC 8.2 02/01/2018   HGB 13.3 02/01/2018   HCT 39.8 02/01/2018   PLT 262.0 02/01/2018   GLUCOSE 91 04/13/2018   CHOL 135 02/01/2018   TRIG 82.0 02/01/2018   HDL 62.60 02/01/2018   LDLCALC 56 02/01/2018   ALT 13 02/01/2018   AST 13 02/01/2018   NA 138 04/13/2018   K 3.7 04/13/2018   CL 105 04/13/2018   CREATININE 0.87 04/13/2018   BUN 16 04/13/2018   CO2 27 04/13/2018   TSH 0.77 02/01/2018   INR 0.92 07/25/2017   HGBA1C 5.7 02/01/2018       Assessment & Plan:

## 2018-04-20 NOTE — Patient Instructions (Signed)
Please take all new medication as prescribed - the antibiotic, prednisone, and the nasacort  Please continue all other medications as before, and refills have been done if requested.  Please have the pharmacy call with any other refills you may need.  Please keep your appointments with your specialists as you may have planned

## 2018-04-20 NOTE — Assessment & Plan Note (Signed)
Mild to mod, for short course predpac asd, add nasacort asd,  to f/u any worsening symptoms or concerns

## 2018-04-20 NOTE — Assessment & Plan Note (Signed)
Mild to mod, for antibx course,  to f/u any worsening symptoms or concerns 

## 2018-04-20 NOTE — Assessment & Plan Note (Signed)
stable overall by history and exam, recent data reviewed with pt, and pt to continue medical treatment as before,  to f/u any worsening symptoms or concerns  

## 2018-05-08 ENCOUNTER — Telehealth: Payer: Self-pay

## 2018-05-08 DIAGNOSIS — R399 Unspecified symptoms and signs involving the genitourinary system: Secondary | ICD-10-CM

## 2018-05-08 NOTE — Telephone Encounter (Signed)
Informed patient

## 2018-05-08 NOTE — Telephone Encounter (Signed)
Copied from [place] [id number]. Topic: Appointment Scheduling - Scheduling Inquiry for Clinic >> May 08, 2018  9:42 AM Burchel, Abbi R wrote: Pt requesting same day appt with Dr Jenny Reichmann this afternoon or tomorrow for urinary sx (pt states she was unable to urninate very much yesterday, but was feeling urgency and burning with very little output all day.  She believes she has a UTI, and is scheduled to go out of town for the [place] and would like to be seen before). Please call pt to schedule if possible.  She only wants to see Dr Jenny Reichmann.     [phone] >> May 08, 2018 10:03 AM Morphies, Isidoro Donning wrote: Patient is scheduled to come tomorrow at 4:00pm. If possible for work-in sooner, let me know and I can see if she can come.

## 2018-05-09 ENCOUNTER — Other Ambulatory Visit (INDEPENDENT_AMBULATORY_CARE_PROVIDER_SITE_OTHER): Payer: Medicare Other

## 2018-05-09 ENCOUNTER — Telehealth: Payer: Self-pay | Admitting: Internal Medicine

## 2018-05-09 ENCOUNTER — Ambulatory Visit (INDEPENDENT_AMBULATORY_CARE_PROVIDER_SITE_OTHER): Payer: Medicare Other

## 2018-05-09 ENCOUNTER — Ambulatory Visit (INDEPENDENT_AMBULATORY_CARE_PROVIDER_SITE_OTHER): Payer: Medicare Other | Admitting: Internal Medicine

## 2018-05-09 ENCOUNTER — Encounter: Payer: Self-pay | Admitting: Internal Medicine

## 2018-05-09 VITALS — BP 118/76 | HR 89 | Temp 98.4°F | Ht 63.0 in | Wt 152.0 lb

## 2018-05-09 DIAGNOSIS — R61 Generalized hyperhidrosis: Secondary | ICD-10-CM | POA: Diagnosis not present

## 2018-05-09 DIAGNOSIS — R300 Dysuria: Secondary | ICD-10-CM | POA: Diagnosis not present

## 2018-05-09 DIAGNOSIS — R55 Syncope and collapse: Secondary | ICD-10-CM

## 2018-05-09 DIAGNOSIS — R05 Cough: Secondary | ICD-10-CM | POA: Diagnosis not present

## 2018-05-09 DIAGNOSIS — R399 Unspecified symptoms and signs involving the genitourinary system: Secondary | ICD-10-CM | POA: Diagnosis not present

## 2018-05-09 DIAGNOSIS — R059 Cough, unspecified: Secondary | ICD-10-CM

## 2018-05-09 LAB — URINALYSIS, ROUTINE W REFLEX MICROSCOPIC
Bilirubin Urine: NEGATIVE
Hgb urine dipstick: NEGATIVE
KETONES UR: NEGATIVE
NITRITE: NEGATIVE
RBC / HPF: NONE SEEN (ref 0–?)
TOTAL PROTEIN, URINE-UPE24: NEGATIVE
URINE GLUCOSE: NEGATIVE
Urobilinogen, UA: 0.2 (ref 0.0–1.0)
pH: 7 (ref 5.0–8.0)

## 2018-05-09 MED ORDER — HYDROCODONE-HOMATROPINE 5-1.5 MG/5ML PO SYRP: 5.0000 mL | ORAL_SOLUTION | Freq: Four times a day (QID) | ORAL | 0 refills | Status: DC | PRN

## 2018-05-09 MED ORDER — POTASSIUM CHLORIDE ER 10 MEQ PO TBCR: 10.0000 meq | EXTENDED_RELEASE_TABLET | Freq: Every day | ORAL | 3 refills | Status: DC

## 2018-05-09 MED ORDER — CEPHALEXIN 500 MG PO CAPS: 500.0000 mg | ORAL_CAPSULE | Freq: Four times a day (QID) | ORAL | 0 refills | Status: DC

## 2018-05-09 NOTE — Progress Notes (Signed)
Subjective:    Patient ID: Renee [name], female    DOB: January 12, 1946, 72 y.o.   MRN: [id number]  HPI  Here with GU complaints of 3 days onset mild persistent dysuria and urinary retention for some reason better today, but still with lower abd pressure and discomfort, as well as a "dark brown" urine x 1 yesterday, and left flank pain.  Has felt warm not sure about fever, no chills.  Was assoc once with sweating and water running from the face, as well as strange bilat hand burning while sitting on commode.  Also has a persistent Hacking dry cough, cant get rid of it since last visit, with intermittent sinus allergy congestion, but denies post nasal gtt.  Pt denies chest pain, increased sob or doe, wheezing, orthopnea, PND, increased LE swelling, palpitations, dizziness or syncope, and no hx of asthma.   Past Medical History:  Diagnosis Date  . Anxiety   . AV nodal re-entry tachycardia [facility])    a. s/p RFA 1993- Dr. Rolland Porter, [place]  . Broken neck ([place])    1993  . Cancer (HCC)    hx of cervical   ca  . Chronic chest pain    a. 2005 Cath: Mild nonobstructive plaque (30-40% LCX);  b. 08/2011 low risk myoview;  c. 09/2011 Coronary CT: NL cors w ca score of 0.  . Chronic pain syndrome   . Complication of anesthesia    blood pressure and heart dropped in 2009    . Concussion    1979, 1993  . Degenerative joint disease    a. s/p L Total Knee Arthroplasty.  . Diet-controlled type 2 diabetes mellitus ([place])   . Diverticulosis of colon   . Duodenitis without hemorrhage   . Dysrhythmia    REPORTS , SINCE PLACEMENT OF PACEMAKER, STILL CAN FEEL HEART " GOING IN AND OUT OF [facility]" REPORTS, YESTERDAY HER BP SYSTOLIC WAS IN THE 030S AND WAS BREATHLESS, HR 112, SWEATING ; DENIES SYNCOPE  , DENIES CHEST PAIN   . Fibromyalgia   . GERD (gastroesophageal reflux disease)   . Hemorrhoids   . Hypertension   . Irritable bowel syndrome   . Long QT interval    a. mild - advised to avoid meds that may prolong QT.    Marland Kitchen Neuropathy   . Osteoporosis   . Paroxysmal A-fib (HCC)    PER PATIENT. I CAN FEEL MY HEART RACING WHEN IM IN IT   . SVT (supraventricular tachycardia) (HCC)    a. nonsustained SVT - previously offered flecainide but refused;  b. 12/2012 30 day event monitor w/o significant arrhythmias.  . Thyroid nodule    Past Surgical History:  Procedure Laterality Date  . ABLATION OF DYSRHYTHMIC FOCUS  1993  . ANTERIOR AND POSTERIOR REPAIR N/A 08/01/2017   Procedure: ANTERIOR (CYSTOCELE) AND POSTERIOR REPAIR (RECTOCELE);  Surgeon: Bjorn Loser, MD;  Location: [facility];  Service: Urology;  Laterality: N/A;  . BILATERAL SALPINGECTOMY Left 08/01/2017   Procedure: SALPINGECTOMY;  Surgeon: Bobbye Charleston, MD;  Location: [facility];  Service: Gynecology;  Laterality: Left;  . BIOPSY THYROID    . CARDIAC ELECTROPHYSIOLOGY STUDY AND ABLATION     atrioventricular nodal reentant tachycardia  . CHOLECYSTECTOMY  1980  . COLONOSCOPY  06/25/2008   Diverticulosis and Hemorrhoids  . COLPOSCOPY    . CORONARY ANGIOPLASTY  2007  . CYSTOSCOPY N/A 08/01/2017   Procedure: CYSTOSCOPY;  Surgeon: Bjorn Loser, MD;  Location: [facility];  Service: Urology;  Laterality: N/A;  .  EP IMPLANTABLE DEVICE N/A 03/03/2016   Procedure: Pacemaker Implant;  Surgeon: Evans Lance, MD;  Location: [facility];  Service: Cardiovascular;  Laterality: N/A;  . ESOPHAGOGASTRODUODENOSCOPY    . HYSTEROSCOPY  2002   with resection of endometrial polyps. by Dr.MCPhail  . HYSTEROSCOPY W/D&C N/A 02/05/2015   Procedure: DILATATION AND CURETTAGE /HYSTEROSCOPY;  Surgeon: Vanessa Kick, MD;  Location: [facility];  Service: Gynecology;  Laterality: N/A;  . LOOP RECORDER IMPLANT  08/21/2013   MDT LinQ implanted by Dr Lovena Le for syncope  . LOOP RECORDER IMPLANT N/A 08/21/2013   Procedure: LOOP RECORDER IMPLANT;  Surgeon: Evans Lance, MD;  Location: [facility] CATH LAB;  Service: Cardiovascular;  Laterality: N/A;  . ORIF RADIAL HEAD / NECK FRACTURE   09/05/88  . TILT TABLE STUDY N/A 05/15/2013   Procedure: TILT TABLE STUDY;  Surgeon: Deboraha Sprang, MD;  Location[facility];  Service: Cardiovascular;  Laterality: N/A;  . TOTAL HIP ARTHROPLASTY  10/10   Left, at [place] N/A 08/01/2017   Procedure: HYSTERECTOMY VAGINAL;  Surgeon: Bobbye Charleston, MD;  Location: [facility];  Service: Gynecology;  Laterality: N/A;    reports that she has never smoked. She has never used smokeless tobacco. She reports that she drinks alcohol. She reports that she does not use drugs. family history includes Asthma in her mother and sister; Diabetes in her brother, mother, and sister; Heart disease in her sister and sister; Lung cancer in her father; Ovarian cancer in her mother; Stroke in her mother. Allergies  Allergen Reactions  . Cyclobenzaprine Other (See Comments)    Pt states that this medication does not work for her.    . Azithromycin Other (See Comments)    Cannot take due to prolonged QT  . Nitrofurantoin Other (See Comments)  . Adhesive [Tape] Itching, Rash and Other (See Comments)    Reaction:  Blisters  Pt states that she is allergic to the adhesive backing of EKG pads if left on for more than 24 hours Patient states she tolerates paper tape ok  . Moxifloxacin Other (See Comments)    Unknown allergic reaction many years ago  . Quinolones Other (See Comments)    Unknown allergic reaction many years ago   Current Outpatient Medications on File Prior to Visit  Medication Sig Dispense Refill  . ACCU-CHEK FASTCLIX LANCETS MISC USE UP TO 4 TIMES DAILY AS  DIRECTED 408 each 1  . ACCU-CHEK GUIDE test strip USE UP TO 4 TIMES DAILY AS  DIRECTED 100 each 1  . ALPRAZolam (XANAX) 0.5 MG tablet Take 0.5-1 tablets (0.25-0.5 mg total) by mouth 2 (two) times daily as needed for anxiety. 90 tablet 1  . benzonatate (TESSALON PERLES) 100 MG capsule 1-2 tab by mouth three times per day as needed for cough 40 capsule 1  . blood  glucose meter kit and supplies KIT Dispense per patient and insurance preferred. Use up to four times daily as directed. E11.9 1 each 0  . diltiazem (CARDIZEM CD) 240 MG 24 hr capsule Take 1 capsule (240 mg total) by mouth every morning. 90 capsule 2  . flecainide (TAMBOCOR) 100 MG tablet Take 1 tablet (100 mg total) by mouth 2 (two) times daily. 60 tablet 11  . nitroGLYCERIN (NITROSTAT) 0.4 MG SL tablet Place 1 tablet (0.4 mg total) under the tongue every 5 (five) minutes as needed for chest pain. 30 tablet 0  . omeprazole (PRILOSEC) 40 MG capsule Take 1  capsule (40 mg total) by mouth every evening. 90 capsule 2  . predniSONE (DELTASONE) 10 MG tablet 3 tabs by mouth per day for 3 days,2tabs per day for 3 days,1tab per day for 3 days 18 tablet 0  . sodium chloride (OCEAN) 0.65 % SOLN nasal spray Place 1-2 sprays into both nostrils 4 (four) times daily as needed for congestion.    . triamcinolone (NASACORT) 55 MCG/ACT AERO nasal inhaler Place 2 sprays into the nose daily. 1 Inhaler 12  . triamterene-hydrochlorothiazide (DYAZIDE) 37.5-25 MG capsule Take 1 each (1 capsule total) by mouth daily. 30 capsule 11  . zolpidem (AMBIEN) 5 MG tablet Take 1 tablet (5 mg total) by mouth at bedtime as needed for sleep. 90 tablet 1   No current facility-administered medications on file prior to visit.    Review of Systems  Constitutional: Negative for other unusual diaphoresis or sweats HENT: Negative for ear discharge or swelling Eyes: Negative for other worsening visual disturbances Respiratory: Negative for stridor or other swelling  Gastrointestinal: Negative for worsening distension or other blood Genitourinary: Negative for retention or other urinary change Musculoskeletal: Negative for other MSK pain or swelling Skin: Negative for color change or other new lesions Neurological: Negative for worsening tremors and other numbness  Psychiatric/Behavioral: Negative for worsening agitation or other  fatigue All other system neg per pt    Objective:   Physical Exam BP 118/76   Pulse 89   Temp 98.4 F (36.9 C) (Oral)   Ht _0  (1.6 m)   Wt 152 lb (68.9 kg)   SpO2 94%   BMI 26.93 kg/m  VS noted,  Constitutional: Pt appears in NAD HENT: Head: NCAT.  Right Ear: External ear normal.  Left Ear: External ear normal.  Eyes: . Pupils are equal, round, and reactive to light. Conjunctivae and EOM are normal Nose: without d/c or deformity Neck: Neck supple. Gross normal ROM Cardiovascular: Normal rate and regular rhythm.   Pulmonary/Chest: Effort normal and breath sounds decreased without rales or wheezing.  Abd:  Soft, NT ND, + BS, no organomegaly and mild left flank tender Neurological: Pt is alert. At baseline orientation, motor grossly intact Skin: Skin is warm. No rashes, other new lesions, no LE edema Psychiatric: Pt behavior is normal without agitation, 2+ nervous  No other exam findings    Assessment & Plan:

## 2018-05-09 NOTE — Assessment & Plan Note (Signed)
Etiology unclear,  Lab Results  Component Value Date   TSH 0.77 02/01/2018  recent tsh normal,  to f/u any worsening symptoms or concerns

## 2018-05-09 NOTE — Assessment & Plan Note (Signed)
?   Cough variant asthma, cxr earlier this yr neg, declines repeat, declines symbicort trial, for cough med prn limited rx, consider f/u pulmonary if persists

## 2018-05-09 NOTE — Patient Instructions (Addendum)
  Please take all new medication as prescribed - the cephalexin  The urine culture is pending  Please take all new medication as prescribed - the cough medicine, but consider calling in 2-3 wks if not improved for possible Symbicort and/or seeing Dr Lenna Gilford  Please continue all other medications as before, and refills have been done if requested.  Please have the pharmacy call with any other refills you may need..  Please keep your appointments with your specialists as you may have planned

## 2018-05-09 NOTE — Progress Notes (Signed)
Remote pacemaker transmission.   

## 2018-05-09 NOTE — Telephone Encounter (Signed)
Copied from [place] [id number]. Topic: Quick Communication - Rx Refill/Question >> May 09, 2018  5:58 PM Marin Olp L wrote: Medication: HYDROcodone-homatropine (HYCODAN) 5-1.5 MG/5ML syrup (out of stock)  Has the patient contacted their pharmacy? Yes.   (Agent: If no, request that the patient contact the pharmacy for the refill.) (Agent: If yes, when and what did the pharmacy advise?)  Preferred Pharmacy (with phone number or street name): [place], [place] - [address] [place] [address] Phone: [phone] Fax: [phone]  Agent: Please be advised that RX refills may take up to 3 business days. We ask that you follow-up with your pharmacy.

## 2018-05-09 NOTE — Telephone Encounter (Signed)
New message    Pt c/o medication issue:  1. Name of Medication: potassium  2. How are you currently taking this medication (dosage and times per day)? Once daily  3. Are you having a reaction (difficulty breathing--STAT)? no  4. What is your medication issue? Pt has a question about her medication. She said she used to get the generic pill and now she is getting name brand and it's more money. She said she wants to know if she can go back to generic.

## 2018-05-09 NOTE — Telephone Encounter (Signed)
Returned call to Pt.  Appears PCP ordered Pt's potassium as a name brand.  Will reorder as generic per Pt request.  No further action needed.

## 2018-05-09 NOTE — Assessment & Plan Note (Signed)
With c/o retention like symptoms, abd and left flank pain of unclear etiology, cant r/o UTI, for urine cx, empiric cephalexin asd,  to f/u any worsening symptoms or concerns

## 2018-05-10 LAB — URINE CULTURE
MICRO NUMBER:: 91430822
Result:: NO GROWTH
SPECIMEN QUALITY:: ADEQUATE

## 2018-05-14 MED ORDER — HYDROCODONE-HOMATROPINE 5-1.5 MG/5ML PO SYRP: 5.0000 mL | ORAL_SOLUTION | Freq: Four times a day (QID) | ORAL | 0 refills | Status: DC | PRN

## 2018-05-14 NOTE — Addendum Note (Signed)
Addended by: Biagio Borg on: 05/14/2018 06:07 PM   Modules accepted: Orders

## 2018-05-14 NOTE — Telephone Encounter (Signed)
Done erx to [place]

## 2018-05-21 ENCOUNTER — Telehealth: Payer: Self-pay | Admitting: Internal Medicine

## 2018-05-21 ENCOUNTER — Ambulatory Visit: Payer: Self-pay

## 2018-05-21 MED ORDER — HYDROCOD POLST-CPM POLST ER 10-8 MG/5ML PO SUER: 5.0000 mL | Freq: Two times a day (BID) | ORAL | 0 refills | Status: DC | PRN

## 2018-05-21 NOTE — Telephone Encounter (Signed)
Dr. Quay Burow called and I call the patient and connected the calls.

## 2018-05-21 NOTE — Telephone Encounter (Signed)
Patient called and asked about what is going on with her today. She says "I don't want to go over it again. I need to speak to Dr. Ronnald Ramp." I advised the office is closed, but I will try to call anyway. I called and no one picked up at 1708. I advised no one picked up and I advised he is not on call, so tomorrow he will receive the message and return the call. She says "I am on a job that I cannot answer the phone and I have been over there twice for what is going on and I need to speak to the on call physician." I asked if I should tell the on call physician anything, she says "I have insurance and my insurance says I can talk to a doctor, so I need to speak to them." I called and spoke to Dr. Quay Burow who says she is driving and unable to take the patient's number down at the moment. I advised Dr. Quay Burow I will call back on another line that the number will show up and she can call me back on that line and I will conference the patient at that time. I advised the patient of the above, the patient verbalized understanding and says she will wait on the call tonight. I called Dr. Quay Burow on a number she can return the call to me directly at [facility].

## 2018-05-21 NOTE — Telephone Encounter (Signed)
Patient called after the office had closed.  She was seen 11/8 and 11/27 and is still not feeling better.  At her first visit she was prescribed Ladona Ridgel and they were not covered by her insurance, but she did not call the office to request a different medication.  At her second visit she was prescribed Hycodan cough syrup, but her pharmacy did not have this medication-she did not call to let Dr. Jenny Reichmann know this.  She is still feeling sick and has not had anything for her cough and would like to have something called in to her pharmacy.  Tussionex sent to [facility].

## 2018-05-23 NOTE — Telephone Encounter (Signed)
This medication is for cough  I am not responsible for calling about Prior Authorizations, though I can direct this to my assistant

## 2018-05-23 NOTE — Telephone Encounter (Signed)
Called pt, LVM to get clarification on what me she picked up.

## 2018-05-23 NOTE — Telephone Encounter (Signed)
D.John please address msg from Dr. Quay Burow. Please advise.

## 2018-05-23 NOTE — Telephone Encounter (Signed)
Patient called in wanting to speak with Dr Judi Cong nurse concerning chlorpheniramine-HYDROcodone [facility] ER) 10-8 MG/5ML SUER   that were prescribed to her that isnt covered under her insurance she is stating she needs a PA done to receive the refund. She states Dr Jenny Reichmann needs to call [phone] to give PA so that she can get the refund on the med.

## 2018-05-24 NOTE — Telephone Encounter (Signed)
Pt called back in and confirmed that she picked up the  chlorpheniramine-HYDROcodone (TUSSIONEX PENNKINETIC ER) 10-8 MG/5ML SURE from the Pharmacy

## 2018-05-29 ENCOUNTER — Other Ambulatory Visit: Payer: Self-pay | Admitting: Internal Medicine

## 2018-05-30 ENCOUNTER — Encounter: Payer: Self-pay | Admitting: Internal Medicine

## 2018-05-30 ENCOUNTER — Ambulatory Visit: Payer: Medicare Other | Admitting: Internal Medicine

## 2018-05-30 VITALS — BP 132/68 | HR 80 | Ht 63.0 in | Wt 158.0 lb

## 2018-05-30 DIAGNOSIS — I480 Paroxysmal atrial fibrillation: Secondary | ICD-10-CM

## 2018-05-30 DIAGNOSIS — Z950 Presence of cardiac pacemaker: Secondary | ICD-10-CM

## 2018-05-30 DIAGNOSIS — I495 Sick sinus syndrome: Secondary | ICD-10-CM
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
## 2018-05-30 NOTE — Patient Instructions (Signed)

## 2018-06-01 LAB — CUP PACEART INCLINIC DEVICE CHECK
Implantable Lead Implant Date: 20170921
Implantable Lead Location: 753859
Implantable Lead Model: 377
Implantable Lead Model: 377
Implantable Lead Serial Number: 49580836
Implantable Pulse Generator Implant Date: 20170921
MDC IDC LEAD IMPLANT DT: 20170921
MDC IDC LEAD LOCATION: 753860
MDC IDC LEAD SERIAL: 49575519
MDC IDC PG SERIAL: 68850891
MDC IDC SESS DTM: 20191220113602

## 2018-06-21 DIAGNOSIS — N8111 Cystocele, midline: Secondary | ICD-10-CM | POA: Diagnosis not present

## 2018-06-21 DIAGNOSIS — R350 Frequency of micturition: Secondary | ICD-10-CM | POA: Diagnosis not present

## 2018-06-29 LAB — CUP PACEART REMOTE DEVICE CHECK
Implantable Lead Implant Date: 20170921
Implantable Lead Model: 377
Implantable Lead Serial Number: 49575519
Implantable Pulse Generator Implant Date: 20170921
MDC IDC LEAD IMPLANT DT: 20170921
MDC IDC LEAD LOCATION: 753859
MDC IDC LEAD LOCATION: 753860
MDC IDC LEAD SERIAL: 49580836
MDC IDC PG SERIAL: 68850891
MDC IDC SESS DTM: 20200117125849

## 2018-07-24 ENCOUNTER — Encounter: Payer: Self-pay | Admitting: Gastroenterology

## 2018-07-24 ENCOUNTER — Ambulatory Visit: Payer: Medicare Other | Admitting: Gastroenterology

## 2018-07-24 VITALS — BP 130/70 | HR 80 | Ht 63.0 in | Wt 164.1 lb

## 2018-07-24 DIAGNOSIS — K5904 Chronic idiopathic constipation: Secondary | ICD-10-CM | POA: Diagnosis not present

## 2018-07-24 DIAGNOSIS — K921 Melena: Secondary | ICD-10-CM

## 2018-07-24 DIAGNOSIS — Z86010 Personal history of colonic polyps: Secondary | ICD-10-CM

## 2018-07-24 MED ORDER — NA SULFATE-K SULFATE-MG SULF 17.5-3.13-1.6 GM/177ML PO SOLN: 1.0000 | Freq: Once | ORAL | 0 refills | Status: AC

## 2018-07-24 NOTE — Patient Instructions (Addendum)
Take Miralax mixing 17 grams in 8 oz of water 2-3 x daily.   You have been scheduled for a colonoscopy. Please follow written instructions given to you at your visit today.  Please pick up your prep supplies at the pharmacy within the next 1-3 days. If you use inhalers (even only as needed), please bring them with you on the day of your procedure. Your physician has requested that you go to www.startemmi.com and enter the access code given to you at your visit today. This web site gives a general overview about your procedure. However, you should still follow specific instructions given to you by our office regarding your preparation for the procedure.   You can use preparation H suppositories coated in 1 % hydrocortisone cream daily x 1 week then as needed.   Thank you for choosing me and [facility].  Pricilla Riffle. Dagoberto Ligas., MD., Marval Regal
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
## 2018-08-02 DIAGNOSIS — N8111 Cystocele, midline: Secondary | ICD-10-CM | POA: Diagnosis not present

## 2018-08-04 DIAGNOSIS — I10 Essential (primary) hypertension: Secondary | ICD-10-CM | POA: Diagnosis not present

## 2018-08-04 DIAGNOSIS — Z7982 Long term (current) use of aspirin: Secondary | ICD-10-CM | POA: Diagnosis not present

## 2018-08-04 DIAGNOSIS — Z950 Presence of cardiac pacemaker: Secondary | ICD-10-CM | POA: Diagnosis not present

## 2018-08-04 DIAGNOSIS — M1711 Unilateral primary osteoarthritis, right knee: Secondary | ICD-10-CM | POA: Diagnosis not present

## 2018-08-04 DIAGNOSIS — Z79899 Other long term (current) drug therapy: Secondary | ICD-10-CM | POA: Diagnosis not present

## 2018-08-04 DIAGNOSIS — I2510 Atherosclerotic heart disease of native coronary artery without angina pectoris: Secondary | ICD-10-CM | POA: Diagnosis not present

## 2018-08-04 DIAGNOSIS — M25561 Pain in right knee: Secondary | ICD-10-CM | POA: Diagnosis not present

## 2018-08-04 DIAGNOSIS — M25461 Effusion, right knee: Secondary | ICD-10-CM | POA: Diagnosis not present

## 2018-08-04 DIAGNOSIS — K219 Gastro-esophageal reflux disease without esophagitis: Secondary | ICD-10-CM | POA: Diagnosis not present

## 2018-08-08 ENCOUNTER — Ambulatory Visit (INDEPENDENT_AMBULATORY_CARE_PROVIDER_SITE_OTHER): Payer: Medicare Other | Admitting: *Deleted

## 2018-08-08 DIAGNOSIS — R55 Syncope and collapse: Secondary | ICD-10-CM | POA: Diagnosis not present

## 2018-08-08 DIAGNOSIS — R001 Bradycardia, unspecified: Secondary | ICD-10-CM

## 2018-08-09 LAB — CUP PACEART REMOTE DEVICE CHECK
Implantable Lead Implant Date: 20170921
Implantable Lead Location: 753860
Implantable Lead Model: 377
Implantable Lead Model: 377
Implantable Lead Serial Number: 49580836
MDC IDC LEAD IMPLANT DT: 20170921
MDC IDC LEAD LOCATION: 753859
MDC IDC LEAD SERIAL: 49575519
MDC IDC PG IMPLANT DT: 20170921
MDC IDC PG SERIAL: 68850891
MDC IDC SESS DTM: 20200227194150

## 2018-08-10 ENCOUNTER — Telehealth (HOSPITAL_COMMUNITY): Payer: Self-pay

## 2018-08-10 NOTE — Telephone Encounter (Signed)
Pt called concerned with potential blood clot in her right knee. Symptoms started after getting on her knee to clean her cat box. Symptoms were a red spot on the back of her knee with swelling and pain. Pt had been to the ED in [facility] and had been ruled out for a blood clot. She called because she was concerned that it was still there however symptoms had resolved. Chart reviewed. D.dimer and xray negative. Advised pt to follow up with her PCP.

## 2018-08-15 ENCOUNTER — Encounter: Payer: Self-pay | Admitting: Gastroenterology

## 2018-08-15 ENCOUNTER — Other Ambulatory Visit: Payer: Self-pay

## 2018-08-15 ENCOUNTER — Ambulatory Visit (AMBULATORY_SURGERY_CENTER): Payer: Medicare Other | Admitting: Gastroenterology

## 2018-08-15 VITALS — BP 121/76 | HR 71 | Temp 98.4°F | Resp 16 | Ht 63.0 in | Wt 164.0 lb

## 2018-08-15 DIAGNOSIS — K921 Melena: Secondary | ICD-10-CM

## 2018-08-15 DIAGNOSIS — K5730 Diverticulosis of large intestine without perforation or abscess without bleeding: Secondary | ICD-10-CM

## 2018-08-15 DIAGNOSIS — K5904 Chronic idiopathic constipation: Secondary | ICD-10-CM

## 2018-08-15 DIAGNOSIS — K648 Other hemorrhoids: Secondary | ICD-10-CM | POA: Diagnosis not present

## 2018-08-15 DIAGNOSIS — K5900 Constipation, unspecified: Secondary | ICD-10-CM | POA: Diagnosis not present

## 2018-08-15 DIAGNOSIS — Z86010 Personal history of colonic polyps: Secondary | ICD-10-CM

## 2018-08-15 DIAGNOSIS — D120 Benign neoplasm of cecum: Secondary | ICD-10-CM

## 2018-08-15 MED ORDER — SODIUM CHLORIDE 0.9 % IV SOLN: 500.0000 mL | Freq: Once | INTRAVENOUS | Status: DC

## 2018-08-15 NOTE — Op Note (Signed)
[facility] Patient Name: Renee [name] Procedure Date: 08/15/2018 2:20 PM MRN: [id number] Endoscopist: Ladene Artist , MD Age: 73 Referring MD:  Date of Birth: 06-May-1946 Gender: Female Account #: [id number] Procedure:                Colonoscopy Indications:              Hematochezia, Chronic idiopathic constipation,                            Personal history of adenomatous colon polyps. Medicines:                Monitored Anesthesia Care Procedure:                Pre-Anesthesia Assessment:                           - Prior to the procedure, a History and Physical                            was performed, and patient medications and                            allergies were reviewed. The patient's tolerance of                            previous anesthesia was also reviewed. The risks                            and benefits of the procedure and the sedation                            options and risks were discussed with the patient.                            All questions were answered, and informed consent                            was obtained. Prior Anticoagulants: The patient has                            taken no previous anticoagulant or antiplatelet                            agents. ASA Grade Assessment: II - A patient with                            mild systemic disease. After reviewing the risks                            and benefits, the patient was deemed in                            satisfactory condition to undergo the procedure.  After obtaining informed consent, the colonoscope                            was passed under direct vision. Throughout the                            procedure, the patient's blood pressure, pulse, and                            oxygen saturations were monitored continuously. The                            Colonoscope was introduced through the anus and                            advanced to the the  cecum, identified by                            appendiceal orifice and ileocecal valve. The                            ileocecal valve, appendiceal orifice, and rectum                            were photographed. The quality of the bowel                            preparation was good. The colonoscopy was performed                            without difficulty. The patient tolerated the                            procedure well. Scope In: 2:32:27 PM Scope Out: 2:47:56 PM Scope Withdrawal Time: 0 hours 9 minutes 26 seconds  Total Procedure Duration: 0 hours 15 minutes 29 seconds  Findings:                 External hemorrhoids were found on perianal exam.                           A 8 mm polyp was found in the cecum. The polyp was                            sessile. The polyp was removed with a cold snare.                            Resection and retrieval were complete.                           A few small-mouthed diverticula were found in the                            left colon. There was no evidence of diverticular  bleeding.                           Internal hemorrhoids were found during                            retroflexion. The hemorrhoids were Grade I                            (internal hemorrhoids that do not prolapse).                           The exam was otherwise without abnormality on                            direct and retroflexion views. Complications:            No immediate complications. Estimated blood loss:                            None. Estimated Blood Loss:     Estimated blood loss: none. Impression:               - Internal, external hemorrhoids                           - Cecal polyp, sessile, removed by cold snare.                           - Mild left colon diverticulosis.                           - Otherwise normal appearing colonoscopy. Recommendation:           - Repeat colonoscopy in 5 years for surveillance.                            - Patient has a contact number available for                            emergencies. The signs and symptoms of potential                            delayed complications were discussed with the                            patient. Return to normal activities tomorrow.                            Written discharge instructions were provided to the                            patient.                           - Resume previous diet.                           -  Continue present medications.                           - OTC Prep H cream or supp PR bid as needed for                            hemorrhoidal symptoms                           - Await pathology results. Ladene Artist, MD 08/15/2018 2:54:48 PM This report has been signed electronically.

## 2018-08-15 NOTE — Progress Notes (Signed)
PT taken to PACU. Monitors in place. VSS. Report given to RN. 

## 2018-08-15 NOTE — Patient Instructions (Signed)
Thank you for allowing  [place] to care for you today!  Await pathology results by mail, approximately 2 weeks.  Continue present medications.  Add Over the counter Prep H cream or suppositories as needed for hemorrhoid symptoms  Recommend next surveillance colonoscopy in 5 years.   YOU HAD AN ENDOSCOPIC PROCEDURE TODAY AT [facility]:   Refer to the procedure report that was given to you for any specific questions about what was found during the examination.  If the procedure report does not answer your questions, please call your gastroenterologist to clarify.  If you requested that your care partner not be given the details of your procedure findings, then the procedure report has been included in a sealed envelope for you to review at your convenience later.  YOU SHOULD EXPECT: Some feelings of bloating in the abdomen. Passage of more gas than usual.  Walking can help get rid of the air that was put into your GI tract during the procedure and reduce the bloating. If you had a lower endoscopy (such as a colonoscopy or flexible sigmoidoscopy) you may notice spotting of blood in your stool or on the toilet paper. If you underwent a bowel prep for your procedure, you may not have a normal bowel movement for a few days.  Please Note:  You might notice some irritation and congestion in your nose or some drainage.  This is from the oxygen used during your procedure.  There is no need for concern and it should clear up in a day or so.  SYMPTOMS TO REPORT IMMEDIATELY:   Following lower endoscopy (colonoscopy or flexible sigmoidoscopy):  Excessive amounts of blood in the stool  Significant tenderness or worsening of abdominal pains  Swelling of the abdomen that is new, acute  Fever of 100F or higher   For urgent or emergent issues, a gastroenterologist can be reached at any hour by calling [phone].   DIET:  We do recommend a small meal at first, but then you may proceed to  your regular diet.  Drink plenty of fluids but you should avoid alcoholic beverages for 24 hours.  ACTIVITY:  You should plan to take it easy for the rest of today and you should NOT DRIVE or use heavy machinery until tomorrow (because of the sedation medicines used during the test).    FOLLOW UP: Our staff will call the number listed on your records the next business day following your procedure to check on you and address any questions or concerns that you may have regarding the information given to you following your procedure. If we do not reach you, we will leave a message.  However, if you are feeling well and you are not experiencing any problems, there is no need to return our call.  We will assume that you have returned to your regular daily activities without incident.  If any biopsies were taken you will be contacted by phone or by letter within the next 1-3 weeks.  Please call us at [phone] if you have not heard about the biopsies in 3 weeks.    SIGNATURES/CONFIDENTIALITY: You and/or your care partner have signed paperwork which will be entered into your electronic medical record.  These signatures attest to the fact that that the information above on your After Visit Summary has been reviewed and is understood.  Full responsibility of the confidentiality of this discharge information lies with you and/or your care-partner.

## 2018-08-15 NOTE — Progress Notes (Signed)
Called to room to assist during endoscopic procedure.  Patient ID and intended procedure confirmed with present staff. Received instructions for my participation in the procedure from the performing physician.  

## 2018-08-15 NOTE — Progress Notes (Signed)
Remote pacemaker transmission.   

## 2018-08-16 ENCOUNTER — Telehealth: Payer: Self-pay | Admitting: *Deleted

## 2018-08-16 NOTE — Telephone Encounter (Signed)
Pt returned call and said she is doing good

## 2018-08-16 NOTE — Telephone Encounter (Signed)
No answer. Name identifier. Message left to call if questions or concerns and that we would make an attempt to call a little later in the day.

## 2018-08-16 NOTE — Telephone Encounter (Signed)
Second attempt, left VM.  

## 2018-08-17 ENCOUNTER — Encounter: Payer: Self-pay | Admitting: Gastroenterology

## 2018-08-24 ENCOUNTER — Telehealth: Payer: Self-pay | Admitting: Gastroenterology

## 2018-08-24 NOTE — Telephone Encounter (Signed)
Reviewed the results and mailed her a copy of the letter and pathology report at her request

## 2018-08-24 NOTE — Telephone Encounter (Signed)
Pt called regarding path results, he would like results mailed to her address.

## 2018-09-04 ENCOUNTER — Telehealth: Payer: Self-pay | Admitting: Gastroenterology

## 2018-09-04 NOTE — Telephone Encounter (Signed)
Agree with plans for VSL#2 and titration of Miralax dosing.

## 2018-09-04 NOTE — Telephone Encounter (Signed)
Patient notified

## 2018-09-04 NOTE — Telephone Encounter (Signed)
Patient reports diarrhea with 2 caps of Miralax.  She would like to try something different .  She will try VSL #3.  She will call back for any additional questions or concerns. She will also trytitratting the  miralax between one cap and 2. She would like you to review this Dr. Fuller Plan

## 2018-09-04 NOTE — Telephone Encounter (Signed)
Pt called in and stated that she wanted to speak with the nurse regarding her bowel movements. She did not give anymore information stating she do not want to repeat.

## 2018-09-10 ENCOUNTER — Telehealth: Payer: Self-pay | Admitting: Internal Medicine

## 2018-09-10 ENCOUNTER — Telehealth (HOSPITAL_COMMUNITY): Payer: Self-pay | Admitting: Cardiology

## 2018-09-10 NOTE — Telephone Encounter (Signed)
Returned call to Pt.    Pt states she has had more of her "spells".  She notes her blood sugars have been labile.    She also c/o labile blood pressures and heart rates.  She thinks her pacemaker should be reprogrammed to advise when her heart rate is above 130 because heart rates above 130 "I am in bad shape".  Will discuss with GT.

## 2018-09-10 NOTE — Telephone Encounter (Signed)
Pt c/o Shortness Of Breath: STAT if SOB developed within the last 24 hours or pt is noticeably SOB on the phone  1. Are you currently SOB (can you hear that pt is SOB on the phone)? No    2. How long have you been experiencing SOB? Three weeks  3. Are you SOB when sitting or when up moving around? Moving around. Can not get out of her chair to get something to eat without getting short of breath  4. Are you currently experiencing any other symptoms? Low blood sugar, weakness. Shakiness,   Just concerned about her overall energy levels. She feels terrible. She hasn't had a spell this bad since December.

## 2018-09-10 NOTE — Telephone Encounter (Signed)
Patient left voicemail to report BS drops x 1 week, increased SOB and unable to do normal activity over the past few weeks. Increased HR and increased BP over the weekend  Attempted to return call [facility]

## 2018-09-11 ENCOUNTER — Ambulatory Visit (INDEPENDENT_AMBULATORY_CARE_PROVIDER_SITE_OTHER): Payer: Medicare Other | Admitting: Internal Medicine

## 2018-09-11 ENCOUNTER — Encounter: Payer: Self-pay | Admitting: Internal Medicine

## 2018-09-11 ENCOUNTER — Other Ambulatory Visit (INDEPENDENT_AMBULATORY_CARE_PROVIDER_SITE_OTHER): Payer: Medicare Other

## 2018-09-11 ENCOUNTER — Ambulatory Visit: Payer: Self-pay | Admitting: Internal Medicine

## 2018-09-11 DIAGNOSIS — R531 Weakness: Secondary | ICD-10-CM

## 2018-09-11 LAB — HEPATIC FUNCTION PANEL
ALK PHOS: 106 U/L (ref 39–117)
ALT: 16 U/L (ref 0–35)
AST: 15 U/L (ref 0–37)
Albumin: 4.4 g/dL (ref 3.5–5.2)
BILIRUBIN DIRECT: 0.1 mg/dL (ref 0.0–0.3)
BILIRUBIN TOTAL: 0.6 mg/dL (ref 0.2–1.2)
TOTAL PROTEIN: 7.2 g/dL (ref 6.0–8.3)

## 2018-09-11 LAB — URINALYSIS, ROUTINE W REFLEX MICROSCOPIC
Bilirubin Urine: NEGATIVE
HGB URINE DIPSTICK: NEGATIVE
KETONES UR: NEGATIVE
Nitrite: NEGATIVE
RBC / HPF: NONE SEEN (ref 0–?)
SPECIFIC GRAVITY, URINE: 1.01 (ref 1.000–1.030)
Total Protein, Urine: NEGATIVE
URINE GLUCOSE: NEGATIVE
UROBILINOGEN UA: 0.2 (ref 0.0–1.0)
pH: 7 (ref 5.0–8.0)

## 2018-09-11 LAB — CBC WITH DIFFERENTIAL/PLATELET
BASOS ABS: 0.1 10*3/uL (ref 0.0–0.1)
Basophils Relative: 0.9 % (ref 0.0–3.0)
EOS ABS: 0.2 10*3/uL (ref 0.0–0.7)
Eosinophils Relative: 2.6 % (ref 0.0–5.0)
HEMATOCRIT: 41.6 % (ref 36.0–46.0)
HEMOGLOBIN: 13.8 g/dL (ref 12.0–15.0)
Lymphocytes Relative: 40 % (ref 12.0–46.0)
Lymphs Abs: 2.5 10*3/uL (ref 0.7–4.0)
MCHC: 33.1 g/dL (ref 30.0–36.0)
MCV: 81.9 fl (ref 78.0–100.0)
MONO ABS: 0.3 10*3/uL (ref 0.1–1.0)
Monocytes Relative: 5.6 % (ref 3.0–12.0)
Neutro Abs: 3.1 10*3/uL (ref 1.4–7.7)
Neutrophils Relative %: 50.9 % (ref 43.0–77.0)
Platelets: 261 10*3/uL (ref 150.0–400.0)
RBC: 5.08 Mil/uL (ref 3.87–5.11)
RDW: 13.1 % (ref 11.5–15.5)
WBC: 6.2 10*3/uL (ref 4.0–10.5)

## 2018-09-11 LAB — BASIC METABOLIC PANEL
BUN: 15 mg/dL (ref 6–23)
CO2: 31 mEq/L (ref 19–32)
Calcium: 10 mg/dL (ref 8.4–10.5)
Chloride: 100 mEq/L (ref 96–112)
Creatinine, Ser: 0.83 mg/dL (ref 0.40–1.20)
GFR: 67.37 mL/min (ref >60.00)
GLUCOSE: 65 mg/dL — AB (ref 70–99)
POTASSIUM: 3.7 meq/L (ref 3.5–5.1)
Sodium: 138 mEq/L (ref 135–145)

## 2018-09-11 LAB — TSH: TSH: 1.09 u[IU]/mL (ref 0.35–4.50)

## 2018-09-11 NOTE — Telephone Encounter (Signed)
Pt in AP/VS rhythm today. No arrhythmias recorded.

## 2018-09-11 NOTE — Telephone Encounter (Signed)
Pt. Reports she has had low blood sugars for 2 weeks. Has decreased appetite since being laid off 2 weeks ago. Is shaky in the mornings.One morning BS was 79.Ate waffles with fruit and syrup. Blood sugar went up to 108 at 12 noon. Ate lunch and at 4 BS 88. Discussed incorporating more protein in her diet. Blood sugar was 101 this morning and she feels "a little better." Feels shaky,sweaty, weak with the low blood sugars. Feels like she may need lab work. Wants to know what Dr. Jenny Reichmann thinks .Please advise pt. Does have e-mail if Webex is needed. Answer Assessment - Initial Assessment Questions 1. SYMPTOMS: "What symptoms are you concerned about?"     Shaky, shortness of breath 2. ONSET:  "When did the symptoms start?"     2 weeks ago 3. BLOOD GLUCOSE: "What is your blood glucose level?"      70 - 80 4. USUAL RANGE: "What is your blood glucose level usually?" (e.g., usual fasting morning value, usual evening value)     Usually "a little higher." 5. TYPE 1 or 2:  "Do you know what type of diabetes you have?"  (e.g., Type 1, Type 2, Gestational; doesn't know)      Type 2 6. INSULIN: "Do you take insulin?" "What type of insulin(s) do you use? What is the mode of delivery? (syringe, pen (e.g., injection or  pump)      No 7. DIABETES PILLS: "Do you take any pills for your diabetes?"     No 8. OTHER SYMPTOMS: "Do you have any symptoms?" (e.g., fever, frequent urination, difficulty breathing, vomiting)     Has felt shaky in the morning 9. LOW BLOOD GLUCOSE TREATMENT: "What have you done so far to treat the low blood glucose level?"     Trying to eat better 10. FOOD: "When did you last eat or drink?"       Had cereal and coffee 11. ALONE: "Are you alone right now or is someone with you?"        Blood sugar101 12. PREGNANCY: "Is there any chance you are pregnant?" "When was your last menstrual period?"       No  Protocols used: DIABETES - LOW BLOOD SUGAR-A-AH

## 2018-09-11 NOTE — Telephone Encounter (Signed)
Noted  

## 2018-09-11 NOTE — Telephone Encounter (Signed)
Patient scheduled.

## 2018-09-11 NOTE — Telephone Encounter (Addendum)
Pt has been informed of PCP recommendations and expressed understanding.   Pt would like to move forward with a virtual visit.

## 2018-09-11 NOTE — Telephone Encounter (Signed)
Blood sugar concerns addressed by PCP  Patient still has concerns regarding irregular b/p readings and HR 131/78 HR 92 119/81 HR 132 153/124 HR 108 123/77 HR 79 127/76 HR 84 131/84 108 126/88 HR 88  Patient reports she "is in one of her spells" with irregular b/p, increased fatigue,and increased SOB. Would like to know if she can decrease diltiazem to 180 mg, reports 240 mg maybe the cause for her increased fatigue.  She is working with EP regarding her device settings

## 2018-09-11 NOTE — Telephone Encounter (Signed)
Pt aware that there were not arrhythmias noted on device check.  No recommendations for medication changes. Pt verbalized understanding however insists that her medicines should be adjusted because of her fatigue and exertion.  Advised there were no changes based on results of EP study.

## 2018-09-11 NOTE — Telephone Encounter (Signed)
Conrad Hayfield, NP  Valeda Malm, RN        I discussed with Dr Aundra Dubin.   We are not doing visits in the office at this time due to Horine virus. .   If she is SOB or concerned she may need to call EMS or go to the ED.   For concerns with heart rate she should contact EP.   Thanks Amy     See above. Pt made aware of same. Appreciative of the call

## 2018-09-11 NOTE — Telephone Encounter (Signed)
I have reviewed chart, and as pt is not on an OHA (dm med), does not have end stage liver disease, and no known insulinoma, it appears she is doing the correct thing by eating frequent small meals, as there is no other treatment.  Blood sugar check at the lab is unlikely to be helpful if she has access to a glucometer that is functioning ok.  I would not know what other tests to order based on her symptoms alone since they are non specific and better with eating.     Thanks

## 2018-09-11 NOTE — Telephone Encounter (Signed)
Discussed with Dr Aundra Dubin.   No arrhythmias per EP. No medication changes for now. Please call.   Philana Younis NP-C  12:36 PM

## 2018-09-11 NOTE — Progress Notes (Signed)
Patient ID: Renee [name], female   DOB: [birth year], 73 y.o.   MRN: [id number]  Virtual Visit via Video Note  I connected with Renee [name] on 09/11/18 at  1:00 PM EDT by a video enabled telemedicine application and verified that I am speaking with the correct person using two identifiers. Pt  is at home, and I am in the office and no other persons present.  Pt seen 15 min after schedule due to technical difficulties and lack of understanding by the pt.  I discussed the limitations of evaluation and management by telemedicine and the availability of in person appointments. The patient expressed understanding and agreed to proceed.  History of Present Illness: Pt c/o 3 wks of generalized weakness and shakiness, the last week of which has noted several low sugars, as low as 74.  She quickly got on the internet and today is very concerned there must be a lack of insulin, and how to fix that.  Her symptoms seem somewhat improved by eating, but then seem to recur within a few hours.  Afdmits to lower appetite and less calorie intake over this time, but in the next sentence is argumentative and adamant that her diet has not changed.  She is not willing to consider looking into a reason for her weakness and lower appetite, though I was able to get from her she has no fever, cough or dysuria.  She states repeatedly "I want the labs to find out why I have lower sugars and I cant go on like this."   Past Medical History:  Diagnosis Date  . Allergy    tape, electrodes  . Anxiety   . AV nodal re-entry tachycardia [facility])    a. s/p RFA 1993- Dr. Rolland Porter, [place]  . Broken neck ([place])    1993  . Cancer (HCC)    hx of cervical   ca  . Chronic chest pain    a. 2005 Cath: Mild nonobstructive plaque (30-40% LCX);  b. 08/2011 low risk myoview;  c. 09/2011 Coronary CT: NL cors w ca score of 0.  . Chronic pain syndrome   . Complication of anesthesia    blood pressure and heart dropped in 2009    . Concussion    1979, 1993  . Degenerative joint disease    a. s/p L Total Knee Arthroplasty.  . Diet-controlled type 2 diabetes mellitus ([place])    pt. denies  . Diverticulosis of colon   . Duodenitis without hemorrhage   . Dysrhythmia    REPORTS , SINCE PLACEMENT OF PACEMAKER, STILL CAN FEEL HEART " GOING IN AND OUT OF [facility]" REPORTS, YESTERDAY HER BP SYSTOLIC WAS IN THE 856D AND WAS BREATHLESS, HR 112, SWEATING ; DENIES SYNCOPE  , DENIES CHEST PAIN   . Fibromyalgia   . GERD (gastroesophageal reflux disease)   . Hemorrhoids   . Hypertension   . Irritable bowel syndrome   . Long QT interval    a. mild - advised to avoid meds that may prolong QT.  Marland Kitchen Myocardial infarction [facility]) 09/1991   does not know when second heart attack was  . Neuropathy   . Osteoporosis   . Paroxysmal A-fib (HCC)    PER PATIENT. I CAN FEEL MY HEART RACING WHEN IM IN IT   . SVT (supraventricular tachycardia) (HCC)    a. nonsustained SVT - previously offered flecainide but refused;  b. 12/2012 30 day event monitor w/o significant arrhythmias.  . Thyroid nodule  Past Surgical History:  Procedure Laterality Date  . ABLATION OF DYSRHYTHMIC FOCUS  1993  . ANTERIOR AND POSTERIOR REPAIR N/A 08/01/2017   Procedure: ANTERIOR (CYSTOCELE) AND POSTERIOR REPAIR (RECTOCELE);  Surgeon: Bjorn Loser, MD;  Location: [facility];  Service: Urology;  Laterality: N/A;  . APPENDECTOMY     1980  . BILATERAL SALPINGECTOMY Left 08/01/2017   Procedure: SALPINGECTOMY;  Surgeon: Bobbye Charleston, MD;  Location: [facility];  Service: Gynecology;  Laterality: Left;  . BIOPSY THYROID    . CARDIAC ELECTROPHYSIOLOGY STUDY AND ABLATION     atrioventricular nodal reentant tachycardia  . CHOLECYSTECTOMY  1980  . COLONOSCOPY  06/25/2008   Diverticulosis and Hemorrhoids  . COLPOSCOPY    . CORONARY ANGIOPLASTY  2007  . CYSTOSCOPY N/A 08/01/2017   Procedure: CYSTOSCOPY;  Surgeon: Bjorn Loser, MD;  Location: [facility];  Service: Urology;  Laterality: N/A;  .  EP IMPLANTABLE DEVICE N/A 03/03/2016   Procedure: Pacemaker Implant;  Surgeon: Evans Lance, MD;  Location: [facility];  Service: Cardiovascular;  Laterality: N/A;  . ESOPHAGOGASTRODUODENOSCOPY    . HYSTEROSCOPY  2002   with resection of endometrial polyps. by Dr.MCPhail  . HYSTEROSCOPY W/D&C N/A 02/05/2015   Procedure: DILATATION AND CURETTAGE /HYSTEROSCOPY;  Surgeon: Vanessa Kick, MD;  Location: [facility];  Service: Gynecology;  Laterality: N/A;  . LOOP RECORDER IMPLANT  08/21/2013   MDT LinQ implanted by Dr Lovena Le for syncope  . LOOP RECORDER IMPLANT N/A 08/21/2013   Procedure: LOOP RECORDER IMPLANT;  Surgeon: Evans Lance, MD;  Location: [facility] CATH LAB;  Service: Cardiovascular;  Laterality: N/A;  . ORIF RADIAL HEAD / NECK FRACTURE  09/05/88  . TILT TABLE STUDY N/A 05/15/2013   Procedure: TILT TABLE STUDY;  Surgeon: Deboraha Sprang, MD;  Location: [facility] CATH LAB;  Service: Cardiovascular;  Laterality: N/A;  . TOTAL HIP ARTHROPLASTY  10/10   Left, at [place]    . VAGINAL HYSTERECTOMY N/A 08/01/2017   Procedure: HYSTERECTOMY VAGINAL;  Surgeon: Bobbye Charleston, MD;  Location: [facility];  Service: Gynecology;  Laterality: N/A;    reports that she has never smoked. She has never used smokeless tobacco. She reports current alcohol use. She reports that she does not use drugs. family history includes Asthma in her mother and sister; Diabetes in her brother, mother, and sister; Heart disease in her sister and sister; Lung cancer in her father; Ovarian cancer in her mother; Stroke in her mother. Allergies  Allergen Reactions  . Cyclobenzaprine Other (See Comments)    Pt states that this medication does not work for her.    . Azithromycin Other (See Comments)    Cannot take due to prolonged QT  . Nitrofurantoin Other (See Comments)  . Adhesive [Tape] Itching, Rash and Other (See Comments)    Reaction:  Blisters  Pt states that she is allergic to the  adhesive backing of EKG pads if left on for more than 24 hours Patient states she tolerates paper tape ok  . Moxifloxacin Other (See Comments)    Unknown allergic reaction many years ago  . Quinolones Other (See Comments)    Unknown allergic reaction many years ago   Current Outpatient Medications on File Prior to Visit  Medication Sig Dispense Refill  . ACCU-CHEK FASTCLIX LANCETS MISC USE UP TO 4 TIMES DAILY AS  DIRECTED 408 each 1  . ACCU-CHEK GUIDE test strip USE UP TO 4 TIMES DAILY AS  DIRECTED 100 each 1  .  ALPRAZolam (XANAX) 0.5 MG tablet Take 0.5-1 tablets (0.25-0.5 mg total) by mouth 2 (two) times daily as needed for anxiety. 90 tablet 1  . blood glucose meter kit and supplies KIT Dispense per patient and insurance preferred. Use up to four times daily as directed. E11.9 1 each 0  . diltiazem (CARDIZEM CD) 240 MG 24 hr capsule Take 1 capsule (240 mg total) by mouth every morning. 90 capsule 2  . flecainide (TAMBOCOR) 100 MG tablet Take 1 tablet (100 mg total) by mouth 2 (two) times daily. 60 tablet 11  . nitroGLYCERIN (NITROSTAT) 0.4 MG SL tablet Place 1 tablet (0.4 mg total) under the tongue every 5 (five) minutes as needed for chest pain. 30 tablet 0  . omeprazole (PRILOSEC) 40 MG capsule Take 1 capsule (40 mg total) by mouth every evening. 90 capsule 2  . potassium chloride (K-DUR) 10 MEQ tablet Take 1 tablet (10 mEq total) by mouth daily. 90 tablet 3  . sodium chloride (OCEAN) 0.65 % SOLN nasal spray Place 1-2 sprays into both nostrils 4 (four) times daily as needed for congestion.    . triamcinolone (NASACORT) 55 MCG/ACT AERO nasal inhaler Place 2 sprays into the nose daily. 1 Inhaler 12  . triamterene-hydrochlorothiazide (DYAZIDE) 37.5-25 MG capsule Take 1 each (1 capsule total) by mouth daily. 30 capsule 11  . zolpidem (AMBIEN) 5 MG tablet Take 1 tablet (5 mg total) by mouth at bedtime as needed for sleep. 90 tablet 1   No current facility-administered medications on file prior  to visit.    Observations/Objective: Markedly nervous, argumentative, not willing to be counseled or educated, cn 2-12 intact, somewhat disheveled and fatigued appearance but not otherwise ill or toxic appearing Lab Results  Component Value Date   WBC 8.2 02/01/2018   HGB 13.3 02/01/2018   HCT 39.8 02/01/2018   PLT 262.0 02/01/2018   GLUCOSE 91 04/13/2018   CHOL 135 02/01/2018   TRIG 82.0 02/01/2018   HDL 62.60 02/01/2018   LDLCALC 56 02/01/2018   ALT 13 02/01/2018   AST 13 02/01/2018   NA 138 04/13/2018   K 3.7 04/13/2018   CL 105 04/13/2018   CREATININE 0.87 04/13/2018   BUN 16 04/13/2018   CO2 27 04/13/2018   TSH 0.77 02/01/2018   INR 0.92 07/25/2017   HGBA1C 5.7 02/01/2018   Assessment and Plan: Generalized weakness with shakiness and lower sugars - I suspect this has to do with some underlying physical or psychological reason for lower appetite, vs med side effect, vs other.  Pt has no hx end stage liver disease.  I did offer to have her see endocrinology  Follow Up Instructions: To come to lab for labs as ordered   I discussed the assessment and treatment plan with the patient. The patient was provided an opportunity to ask questions and all were answered. The patient agreed with the plan and demonstrated an understanding of the instructions.   The patient was advised to call back or seek an in-person evaluation if the symptoms worsen or if the condition fails to improve as anticipated.   Cathlean Cower, MD

## 2018-09-11 NOTE — Assessment & Plan Note (Signed)
See above

## 2018-09-11 NOTE — Telephone Encounter (Signed)
Discussed with Dr Aundra Dubin.   Please call her and instruct to send device interrogation to EP.    EP will need to let us know what rhythm she is in.   Dr Aundra Dubin does not want to change any medications until EP weighs in.   General Wearing N:P-C  11:30 AM

## 2018-09-12 ENCOUNTER — Other Ambulatory Visit: Payer: Self-pay | Admitting: Internal Medicine

## 2018-09-12 ENCOUNTER — Telehealth: Payer: Self-pay

## 2018-09-12 DIAGNOSIS — E162 Hypoglycemia, unspecified: Secondary | ICD-10-CM

## 2018-09-12 MED ORDER — HYDROCORTISONE 10 MG PO TABS: 10.0000 mg | ORAL_TABLET | Freq: Every day | ORAL | 2 refills | Status: DC

## 2018-09-12 NOTE — Telephone Encounter (Signed)
-----   Message from Biagio Borg, MD sent at 09/12/2018  8:10 AM EDT ----- Left message on MyChart, pt to cont same tx except  The test results show that your current treatment is OK, except as you know the sugar was low.    After further consideration, due to the pandemic and hard to access endocrinology in a timely fashion, we can try to treat for an unusual problem called adrenal insufficiency ( an adrenal gland low function that is normally evaluated by endocrinology).    We will go ahead and treat with hydrocortisone 10 mg per day, which if not actually needed, would not be harmful.    We can work on a further referral after the pandemic.  I will send the prescription, and you should hear from the office as well.Redmond Baseman to please inform pt, I will do rx

## 2018-09-12 NOTE — Addendum Note (Signed)
Addended by: Biagio Borg on: 09/12/2018 02:01 PM   Modules accepted: Orders

## 2018-09-12 NOTE — Telephone Encounter (Addendum)
Pt has been informed of results and expressed understanding.  She would like to know what is the hydrocortisone is going to do for her adrenal glands? She would also like to know would that medication affect her heart since she is a cardiac patient with a pacemaker? Please advise.

## 2018-09-12 NOTE — Telephone Encounter (Signed)
The medication is simply a replacement for steroid her adrenal gland may no longer be making enough of  There would be no problem with the heart condition, as it doesn't overstimulate the cardiovascular system in any way, and would not interfere with her other medications

## 2018-09-12 NOTE — Telephone Encounter (Signed)
Pt has been informed and expressed understanding and will wait for the referral.

## 2018-09-13 ENCOUNTER — Telehealth: Payer: Self-pay | Admitting: Internal Medicine

## 2018-09-13 NOTE — Telephone Encounter (Signed)
Copied from [place] [id number]. Topic: Referral - Request for Referral >> Sep 13, 2018  3:06 PM Blase Mess A wrote: Has patient seen PCP for this complaint? yes *If NO, is insurance requiring patient see PCP for this issue before PCP can refer them? no Referral for which specialty: endocrinology Preferred provider/office: [facility]  Reason for referral: shortness of breathe, blood sugar, adrenal insufficiency

## 2018-09-13 NOTE — Telephone Encounter (Signed)
Since the referral is done (see "referrals"), there is no need to refer twice, thanks

## 2018-09-13 NOTE — Telephone Encounter (Signed)
Reviewed Dr. Gwynn Burly result note with patient. She is willing to take the hydrocortisone for now. Was insistent of the Endocrinologist referral at this time. Request has been sent to PCP for referral.

## 2018-09-14 NOTE — Telephone Encounter (Signed)
Pt is already aware of referral from discussing her results.

## 2018-09-17 ENCOUNTER — Telehealth: Payer: Self-pay

## 2018-09-17 NOTE — Telephone Encounter (Signed)
This is patient second or third request  No need for ROV, since she already has an endo referral in place since apr 1  Please let pt know

## 2018-09-17 NOTE — Telephone Encounter (Signed)
Copied from [place] [id number]. Topic: Referral - Request for Referral >> Sep 17, 2018 10:59 AM Yvette Rack wrote: Has patient seen PCP for this complaint? yes  *If NO, is insurance requiring patient see PCP for this issue before PCP can refer them? Referral for which specialty: Endocrinology Preferred provider/office: Dr. Acquanetta Belling with [facility]   phone# [phone]

## 2018-09-24 NOTE — Telephone Encounter (Signed)
Pt had f/u with her PCP.  Will await further needs.

## 2018-09-25 DIAGNOSIS — E162 Hypoglycemia, unspecified: Secondary | ICD-10-CM | POA: Diagnosis not present

## 2018-09-25 DIAGNOSIS — E042 Nontoxic multinodular goiter: Secondary | ICD-10-CM | POA: Diagnosis not present

## 2018-09-25 DIAGNOSIS — E559 Vitamin D deficiency, unspecified: Secondary | ICD-10-CM | POA: Diagnosis not present

## 2018-09-25 DIAGNOSIS — R531 Weakness: Secondary | ICD-10-CM | POA: Diagnosis not present

## 2018-09-26 DIAGNOSIS — E162 Hypoglycemia, unspecified: Secondary | ICD-10-CM | POA: Diagnosis not present

## 2018-09-26 DIAGNOSIS — E042 Nontoxic multinodular goiter: Secondary | ICD-10-CM | POA: Diagnosis not present

## 2018-09-30 ENCOUNTER — Other Ambulatory Visit (HOSPITAL_COMMUNITY): Payer: Self-pay | Admitting: Cardiology

## 2018-09-30 DIAGNOSIS — Z950 Presence of cardiac pacemaker: Secondary | ICD-10-CM

## 2018-10-03 ENCOUNTER — Other Ambulatory Visit: Payer: Self-pay

## 2018-10-03 ENCOUNTER — Ambulatory Visit (HOSPITAL_COMMUNITY)
Admission: RE | Admit: 2018-10-03 | Discharge: 2018-10-03 | Disposition: A | Discharge status: Home / Self Care | Payer: Medicare Other | Source: Ambulatory Visit | Attending: Cardiology | Admitting: Cardiology

## 2018-10-03 DIAGNOSIS — I495 Sick sinus syndrome: Secondary | ICD-10-CM

## 2018-10-03 DIAGNOSIS — R0602 Shortness of breath: Secondary | ICD-10-CM | POA: Diagnosis not present

## 2018-10-03 DIAGNOSIS — I471 Supraventricular tachycardia: Secondary | ICD-10-CM

## 2018-10-03 DIAGNOSIS — R0609 Other forms of dyspnea: Secondary | ICD-10-CM

## 2018-10-03 NOTE — Patient Instructions (Signed)
Your physician has requested that you have an echocardiogram. Echocardiography is a painless test that uses sound waves to create images of your heart. It provides your doctor with information about the size and shape of your heart and how well your heart's chambers and valves are working. This procedure takes approximately one hour. There are no restrictions for this procedure. Someone will contact you in order to schedule this when it is available again.  Your physician has recommended that you have a pulmonary function test. Pulmonary Function Tests are a group of tests that measure how well air moves in and out of your lungs. Someone will be in contact with you when it is available again.   Please follow up with Dr. Aundra Dubin in 6 months

## 2018-10-03 NOTE — Progress Notes (Signed)
avs to be mailed to pt.
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
## 2018-10-22 ENCOUNTER — Encounter: Payer: Self-pay | Admitting: Internal Medicine

## 2018-10-22 ENCOUNTER — Other Ambulatory Visit: Payer: Self-pay

## 2018-10-22 ENCOUNTER — Ambulatory Visit: Payer: Self-pay

## 2018-10-22 ENCOUNTER — Ambulatory Visit (INDEPENDENT_AMBULATORY_CARE_PROVIDER_SITE_OTHER): Payer: Medicare Other | Admitting: Internal Medicine

## 2018-10-22 VITALS — BP 132/80 | HR 71 | Temp 98.4°F | Ht 63.0 in | Wt 164.0 lb

## 2018-10-22 DIAGNOSIS — J309 Allergic rhinitis, unspecified: Secondary | ICD-10-CM | POA: Diagnosis not present

## 2018-10-22 DIAGNOSIS — I10 Essential (primary) hypertension: Secondary | ICD-10-CM | POA: Diagnosis not present

## 2018-10-22 DIAGNOSIS — R0600 Dyspnea, unspecified: Secondary | ICD-10-CM

## 2018-10-22 DIAGNOSIS — E162 Hypoglycemia, unspecified: Secondary | ICD-10-CM

## 2018-10-22 MED ORDER — TRIAMCINOLONE ACETONIDE 55 MCG/ACT NA AERO: 2.0000 | INHALATION_SPRAY | Freq: Every day | NASAL | 12 refills | Status: DC

## 2018-10-22 MED ORDER — METHYLPREDNISOLONE ACETATE 80 MG/ML IJ SUSP
80.0000 mg | Freq: Once | INTRAMUSCULAR | Status: AC
  Administered 2018-10-22: 17:00:00 80 mg via INTRAMUSCULAR

## 2018-10-22 NOTE — Patient Instructions (Signed)
You had the steroid shot today  Please take all medication as prescribed - the nasacort as directed  You will be contacted regarding the referral for: Allergy  Please continue all other medications as before, and refills have been done if requested.  Please have the pharmacy call with any other refills you may need.  Please keep your appointments with your specialists as you may have planned such as endocrinology

## 2018-10-22 NOTE — Progress Notes (Addendum)
Subjective:    Patient ID: Renee [name], female    DOB: October 09, 1945, 73 y.o.   MRN: [id number]  HPI  Here to f/u difficulty breathing in that she was awakened twice recently with lying flat with sensation of airway blockage, also with nasal congestion; Does have several wks ongoing nasal allergy symptoms with clearish congestion, itch and sneezing, without fever, pain, ST, cough, swelling or wheezing  Thinks she had recent temp this am 99.6, later 96.5, o/w VSS at home; did have an oxygen drop to 86% on 4/25 with HR 108 on awakening that day, but quickly resolved and stable after getting out of bed. Pt denies chest pain, wheezing, orthopnea, PND, increased LE swelling, palpitations, dizziness or syncope.  Pt denies new neurological symptoms such as new headache, or facial or extremity weakness or numbness   Pt denies polydipsia, polyuria  Has echo and PFT s recently ordered per cardiology.  Her endo referral has been rescheduled to July 27 due to pandemic Past Medical History:  Diagnosis Date   Allergy    tape, electrodes   Anxiety    AV nodal re-entry tachycardia [facility])    a. s/p RFA 1993- Dr. Rolland Porter, [facility]   Broken neck [facility])    1993   Cancer [facility])    hx of cervical   ca   Chronic chest pain    a. 2005 Cath: Mild nonobstructive plaque (30-40% LCX);  b. 08/2011 low risk myoview;  c. 09/2011 Coronary CT: NL cors w ca score of 0.   Chronic pain syndrome    Complication of anesthesia    blood pressure and heart dropped in 2009     Concussion    1979, 1993   Degenerative joint disease    a. s/p L Total Knee Arthroplasty.   Diet-controlled type 2 diabetes mellitus ([facility])    pt. denies   Diverticulosis of colon    Duodenitis without hemorrhage    Dysrhythmia    REPORTS , SINCE PLACEMENT OF PACEMAKER, STILL CAN FEEL HEART " GOING IN AND OUT OF [facility]" REPORTS, YESTERDAY HER BP SYSTOLIC WAS IN THE 329J AND WAS BREATHLESS, HR 112, SWEATING ; DENIES SYNCOPE  , DENIES CHEST PAIN     Fibromyalgia    GERD (gastroesophageal reflux disease)    Hemorrhoids    Hypertension    Irritable bowel syndrome    Long QT interval    a. mild - advised to avoid meds that may prolong QT.   Myocardial infarction [facility]) 09/1991   does not know when second heart attack was   Neuropathy    Osteoporosis    Paroxysmal A-fib (HCC)    PER PATIENT. I CAN FEEL MY HEART RACING WHEN IM IN IT    SVT (supraventricular tachycardia) (HCC)    a. nonsustained SVT - previously offered flecainide but refused;  b. 12/2012 30 day event monitor w/o significant arrhythmias.   Thyroid nodule    Past Surgical History:  Procedure Laterality Date   ABLATION OF DYSRHYTHMIC FOCUS  1993   ANTERIOR AND POSTERIOR REPAIR N/A 08/01/2017   Procedure: ANTERIOR (CYSTOCELE) AND POSTERIOR REPAIR (RECTOCELE);  Surgeon: Bjorn Loser, MD;  Location: [facility];  Service: Urology;  Laterality: N/A;   APPENDECTOMY     1980   BILATERAL SALPINGECTOMY Left 08/01/2017   Procedure: SALPINGECTOMY;  Surgeon: Bobbye Charleston, MD;  Location: [facility];  Service: Gynecology;  Laterality: Left;   BIOPSY THYROID     CARDIAC ELECTROPHYSIOLOGY STUDY AND ABLATION  atrioventricular nodal reentant tachycardia   CHOLECYSTECTOMY  1980   COLONOSCOPY  06/25/2008   Diverticulosis and Hemorrhoids   COLPOSCOPY     CORONARY ANGIOPLASTY  2007   CYSTOSCOPY N/A 08/01/2017   Procedure: CYSTOSCOPY;  Surgeon: Bjorn Loser, MD;  Location: [facility];  Service: Urology;  Laterality: N/A;   EP IMPLANTABLE DEVICE N/A 03/03/2016   Procedure: Pacemaker Implant;  Surgeon: Evans Lance, MD;  Location: [facility];  Service: Cardiovascular;  Laterality: N/A;   ESOPHAGOGASTRODUODENOSCOPY     HYSTEROSCOPY  2002   with resection of endometrial polyps. by Dr.MCPhail   HYSTEROSCOPY W/D&C N/A 02/05/2015   Procedure: DILATATION AND CURETTAGE /HYSTEROSCOPY;  Surgeon: Vanessa Kick, MD;  Location: [facility];  Service: Gynecology;   Laterality: N/A;   LOOP RECORDER IMPLANT  08/21/2013   MDT LinQ implanted by Dr Lovena Le for syncope   LOOP RECORDER IMPLANT N/A 08/21/2013   Procedure: LOOP RECORDER IMPLANT;  Surgeon: Evans Lance, MD;  Location: [facility] C[facility] LAB;  Service: Cardiovascular;  Laterality: N/A;   ORIF RADIAL HEAD / NECK FRACTURE  09/05/88   TILT TABLE STUDY N/A 05/15/2013   Procedure: TILT TABLE STUDY;  Surgeon: Deboraha Sprang, MD;  Location: [facility] CATH LAB;  Service: Cardiovascular;  Laterality: N/A;   TOTAL HIP ARTHROPLASTY  10/10   Left, at [place] N/A 08/01/2017   Procedure: HYSTERECTOMY VAGINAL;  Surgeon: Bobbye Charleston, MD;  Location: [facility];  Service: Gynecology;  Laterality: N/A;    reports that she has never smoked. She has never used smokeless tobacco. She reports current alcohol use. She reports that she does not use drugs. family history includes Asthma in her mother and sister; Diabetes in her brother, mother, and sister; Heart disease in her sister and sister; Lung cancer in her father; Ovarian cancer in her mother; Stroke in her mother. Allergies  Allergen Reactions   Cyclobenzaprine Other (See Comments)    Pt states that this medication does not work for her.     Azithromycin Other (See Comments)    Cannot take due to prolonged QT   Nitrofurantoin Other (See Comments)   Adhesive [Tape] Itching, Rash and Other (See Comments)    Reaction:  Blisters  Pt states that she is allergic to the adhesive backing of EKG pads if left on for more than 24 hours Patient states she tolerates paper tape ok   Moxifloxacin Other (See Comments)    Unknown allergic reaction many years ago   Quinolones Other (See Comments)    Unknown allergic reaction many years ago   Current Outpatient Medications on File Prior to Visit  Medication Sig Dispense Refill   ACCU-CHEK FASTCLIX LANCETS MISC USE UP TO 4 TIMES DAILY AS  DIRECTED 408 each 1    ACCU-CHEK GUIDE test strip USE UP TO 4 TIMES DAILY AS  DIRECTED 100 each 1   ALPRAZolam (XANAX) 0.5 MG tablet Take 0.5-1 tablets (0.25-0.5 mg total) by mouth 2 (two) times daily as needed for anxiety. 90 tablet 1   blood glucose meter kit and supplies KIT Dispense per patient and insurance preferred. Use up to four times daily as directed. E11.9 1 each 0   diltiazem (CARDIZEM CD) 240 MG 24 hr capsule Take 1 capsule (240 mg total) by mouth every morning. 90 capsule 2   flecainide (TAMBOCOR) 100 MG tablet TAKE 1 TABLET BY MOUTH TWO  TIMES DAILY 180 tablet 1  nitroGLYCERIN (NITROSTAT) 0.4 MG SL tablet Place 1 tablet (0.4 mg total) under the tongue every 5 (five) minutes as needed for chest pain. 30 tablet 0   omeprazole (PRILOSEC) 40 MG capsule Take 1 capsule (40 mg total) by mouth every evening. 90 capsule 2   sodium chloride (OCEAN) 0.65 % SOLN nasal spray Place 1-2 sprays into both nostrils 4 (four) times daily as needed for congestion.     triamterene-hydrochlorothiazide (DYAZIDE) 37.5-25 MG capsule TAKE 1 CAPSULE BY MOUTH  DAILY 90 capsule 1   zolpidem (AMBIEN) 5 MG tablet Take 1 tablet (5 mg total) by mouth at bedtime as needed for sleep. 90 tablet 1   hydrocortisone (CORTEF) 10 MG tablet Take 1 tablet (10 mg total) by mouth daily. (Patient not taking: Reported on 10/22/2018) 30 tablet 2   potassium chloride (K-DUR) 10 MEQ tablet Take 1 tablet (10 mEq total) by mouth daily. 90 tablet 3   No current facility-administered medications on file prior to visit.    Review of Systems  Constitutional: Negative for other unusual diaphoresis or sweats HENT: Negative for ear discharge or swelling Eyes: Negative for other worsening visual disturbances Respiratory: Negative for stridor or other swelling  Gastrointestinal: Negative for worsening distension or other blood Genitourinary: Negative for retention or other urinary change Musculoskeletal: Negative for other MSK pain or swelling Skin:  Negative for color change or other new lesions Neurological: Negative for worsening tremors and other numbness  Psychiatric/Behavioral: Negative for worsening agitation or other fatigue All other system neg per pt    Objective:   Physical Exam BP 132/80 (BP Location: Left Arm, Patient Position: Sitting, Cuff Size: Normal)    Pulse 71    Temp 98.4 F (36.9 C) (Oral)    Ht _0  (1.6 m)    Wt 164 lb (74.4 kg)    SpO2 97%    BMI 29.05 kg/m  VS noted,  Constitutional: Pt appears in NAD HENT: Head: NCAT.  Right Ear: External ear normal.  Left Ear: External ear normal.  Bilat tm's with mild erythema.  Max sinus areas non tender.  Pharynx with mild erythema, no exudate Eyes: . Pupils are equal, round, and reactive to light. Conjunctivae and EOM are normal Nose: without d/c or deformity Neck: Neck supple. Gross normal ROM Cardiovascular: Normal rate and regular rhythm.   Pulmonary/Chest: Effort normal and breath sounds without rales or wheezing.  Abd:  Soft, NT, ND, + BS, no organomegaly Neurological: Pt is alert. At baseline orientation, motor grossly intact Skin: Skin is warm. No rashes, other new lesions, no LE edema Psychiatric: Pt behavior is normal without agitation  but 2+ nervous No other exam findings Lab Results  Component Value Date   WBC 6.2 09/11/2018   HGB 13.8 09/11/2018   HCT 41.6 09/11/2018   PLT 261.0 09/11/2018   GLUCOSE 65 (L) 09/11/2018   CHOL 135 02/01/2018   TRIG 82.0 02/01/2018   HDL 62.60 02/01/2018   LDLCALC 56 02/01/2018   ALT 16 09/11/2018   AST 15 09/11/2018   NA 138 09/11/2018   K 3.7 09/11/2018   CL 100 09/11/2018   CREATININE 0.83 09/11/2018   BUN 15 09/11/2018   CO2 31 09/11/2018   TSH 1.09 09/11/2018   INR 0.92 07/25/2017   HGBA1C 5.7 02/01/2018          Assessment & Plan:

## 2018-10-22 NOTE — Telephone Encounter (Signed)
Patient called and says at night for this past week or weekend, she's she waking up not able to breathe. She says she closes her mouth and tries to breathe through her nose and realizes she can't breathe. She says she then clears her throat several times, then the air passes through. She says all of this is documented and she's seen several doctors but nothing is being done about this. She says she doesn't want a virtual visit, because someone needs to look down her throat to see what is going on and this only happens at night. I called the office and spoke to Sam, [facility] who asked to speak to the patient, the call was connected successfully.

## 2018-10-24 ENCOUNTER — Encounter: Payer: Self-pay | Admitting: Internal Medicine

## 2018-10-24 NOTE — Assessment & Plan Note (Signed)
stable overall by history and exam, recent data reviewed with pt, and pt to continue medical treatment as before,  to f/u any worsening symptoms or concerns  

## 2018-10-24 NOTE — Assessment & Plan Note (Signed)
Mild to mod, for depomedrol IM 80, nasacort asd, and refer allergy per pt reqeust,  to f/u any worsening symptoms or concerns

## 2018-10-24 NOTE — Assessment & Plan Note (Signed)
Etiology unclear, for endo f/u as planned

## 2018-10-24 NOTE — Assessment & Plan Note (Signed)
Exam benign, suspect related to post nasal congestion in the airways worse with lying down at night; declines cxr,  to f/u any worsening symptoms or concerns

## 2018-10-28 ENCOUNTER — Encounter: Payer: Self-pay | Admitting: Cardiology

## 2018-10-28 ENCOUNTER — Telehealth: Payer: Self-pay | Admitting: Cardiology

## 2018-10-28 DIAGNOSIS — I480 Paroxysmal atrial fibrillation: Secondary | ICD-10-CM

## 2018-10-28 NOTE — Telephone Encounter (Signed)
Please arrange for her to go ahead and get echo done this week.  Not sure that we can do her PFTs yet, but if we can get them done as well.

## 2018-10-28 NOTE — Telephone Encounter (Signed)
errorenous encounter This encounter was created in error - please disregard.

## 2018-10-28 NOTE — Telephone Encounter (Addendum)
Patient called today very upset because she continues to have weaknss in her legs and she is so weak that she is immobile at this time.   Her soft BP at 99/39mmHg and HR 68bpm and O2 sats 94-96%.  She went outside to try to walk and now feels so weak she is worried she may die.  I recommended that if she feels that bad that she needs to call 911 and to [facility].  She is adamant that she does not want to go to the ER. She stated to me that she is going to make a long list of the doctors taking care of her and will leave instructions for her daughter  to make sure people are responsible for not addressing her issues if she dies tonight.  I again recommended that she call EMS and go to the ER if she is that bad off.    Of note her last pacer interrogation in Feb 2020 was normal.  Coronary CTA in 04/2018 showed no obstructive CAD.  She is VERY upset that she has not had her echo done or her PFTs and says she is holding her MDs responsible if something bad happens.

## 2018-10-30 NOTE — Telephone Encounter (Signed)
Pt ordered for an echo. Scheduled for tomorrow 10am.  Pt amenable to plan and appreciative.

## 2018-10-30 NOTE — Addendum Note (Signed)
Addended by: Valeda Malm on: 10/30/2018 09:20 AM   Modules accepted: Orders

## 2018-10-31 ENCOUNTER — Ambulatory Visit (HOSPITAL_COMMUNITY)
Admission: RE | Admit: 2018-10-31 | Discharge: 2018-10-31 | Disposition: A | Discharge status: Home / Self Care | Payer: Medicare Other | Source: Ambulatory Visit | Attending: Cardiology | Admitting: Cardiology

## 2018-10-31 ENCOUNTER — Other Ambulatory Visit: Payer: Self-pay

## 2018-10-31 DIAGNOSIS — I471 Supraventricular tachycardia: Secondary | ICD-10-CM | POA: Diagnosis not present

## 2018-10-31 DIAGNOSIS — Z950 Presence of cardiac pacemaker: Secondary | ICD-10-CM | POA: Diagnosis not present

## 2018-10-31 DIAGNOSIS — I110 Hypertensive heart disease with heart failure: Secondary | ICD-10-CM | POA: Insufficient documentation

## 2018-10-31 DIAGNOSIS — I480 Paroxysmal atrial fibrillation: Secondary | ICD-10-CM | POA: Diagnosis not present

## 2018-10-31 DIAGNOSIS — I4891 Unspecified atrial fibrillation: Secondary | ICD-10-CM | POA: Insufficient documentation

## 2018-10-31 DIAGNOSIS — I509 Heart failure, unspecified: Secondary | ICD-10-CM | POA: Diagnosis not present

## 2018-10-31 NOTE — Progress Notes (Signed)
  Echocardiogram 2D Echocardiogram has been performed.  Matilde Bash 10/31/2018, 10:29 AM

## 2018-10-31 NOTE — Telephone Encounter (Signed)
Pt has order for PFT's since April. Due to Covid limitations, PFT's are not being scheduled until phase 2.

## 2018-11-01 ENCOUNTER — Telehealth (HOSPITAL_COMMUNITY): Payer: Self-pay | Admitting: Cardiology

## 2018-11-01 NOTE — Telephone Encounter (Signed)
Patient left VM on triage line requesting echo results  Per DM echo 10/31/18 Normal LV and RV systolic function.  Attempted to return call to patient No answer [facility]

## 2018-11-02 NOTE — Telephone Encounter (Signed)
Pt left VM stating she did not want a call from our nursing staff about her echo results she wants to speak directly to a doctor.   Routed to [place]

## 2018-11-08 ENCOUNTER — Other Ambulatory Visit: Payer: Self-pay

## 2018-11-08 ENCOUNTER — Ambulatory Visit (INDEPENDENT_AMBULATORY_CARE_PROVIDER_SITE_OTHER): Payer: Medicare Other | Admitting: *Deleted

## 2018-11-08 ENCOUNTER — Encounter: Payer: Self-pay | Admitting: Allergy

## 2018-11-08 ENCOUNTER — Ambulatory Visit: Payer: Medicare Other | Admitting: Allergy

## 2018-11-08 VITALS — BP 110/72 | HR 68 | Temp 98.5°F | Resp 18 | Ht 62.5 in | Wt 164.0 lb

## 2018-11-08 DIAGNOSIS — J3089 Other allergic rhinitis: Secondary | ICD-10-CM

## 2018-11-08 DIAGNOSIS — R0602 Shortness of breath: Secondary | ICD-10-CM | POA: Diagnosis not present

## 2018-11-08 DIAGNOSIS — I495 Sick sinus syndrome: Secondary | ICD-10-CM

## 2018-11-08 MED ORDER — ALBUTEROL SULFATE HFA 108 (90 BASE) MCG/ACT IN AERS: 2.0000 | INHALATION_SPRAY | RESPIRATORY_TRACT | 3 refills | Status: DC | PRN

## 2018-11-08 NOTE — Patient Instructions (Addendum)
Today's skin testing showed: Positive to dust mites, dog and mold.  Allergies:  Start environmental control measures.  May use zyrtec 10mg  daily.  May use Nasacort 1-2 sprays in each nostril daily.   Nasal saline spray (i.e., Simply Saline) or nasal saline lavage (i.e., NeilMed) is recommended as needed and prior to medicated nasal sprays.  Breathing:  Go get the full body PFT done - I ordered it.   May use albuterol rescue inhaler 2 puffs or nebulizer every 4 to 6 hours as needed for shortness of breath, chest tightness, coughing, and wheezing. May use albuterol rescue inhaler 2 puffs 5 to 15 minutes prior to strenuous physical activities. Monitor frequency of use.   Follow up in 2 months  Control of House Dust Mite Allergen . Dust mite allergens are a common trigger of allergy and asthma symptoms. While they can be found throughout the house, these microscopic creatures thrive in warm, humid environments such as bedding, upholstered furniture and carpeting. . Because so much time is spent in the bedroom, it is essential to reduce mite levels there.  . Encase pillows, mattresses, and box springs in special allergen-proof fabric covers or airtight, zippered plastic covers.  . Bedding should be washed weekly in hot water (130 F) and dried in a hot dryer. Allergen-proof covers are available for comforters and pillows that can't be regularly washed.  Wendee Copp the allergy-proof covers every few months. Minimize clutter in the bedroom. Keep pets out of the bedroom.  Marland Kitchen Keep humidity less than 50% by using a dehumidifier or air conditioning. You can buy a humidity measuring device called a hygrometer to monitor this.  . If possible, replace carpets with hardwood, linoleum, or washable area rugs. If that's not possible, vacuum frequently with a vacuum that has a HEPA filter. . Remove all upholstered furniture and non-washable window drapes from the bedroom. . Remove all non-washable stuffed toys  from the bedroom.  Wash stuffed toys weekly.  Pet Allergen Avoidance: . Contrary to popular opinion, there are no "hypoallergenic" breeds of dogs or cats. That is because people are not allergic to an animal's hair, but to an allergen found in the animal's saliva, dander (dead skin flakes) or urine. Pet allergy symptoms typically occur within minutes. For some people, symptoms can build up and become most severe 8 to 12 hours after contact with the animal. People with severe allergies can experience reactions in public places if dander has been transported on the pet owners' clothing. Marland Kitchen Keeping an animal outdoors is only a partial solution, since homes with pets in the yard still have higher concentrations of animal allergens. . Before getting a pet, ask your allergist to determine if you are allergic to animals. If your pet is already considered part of your family, try to minimize contact and keep the pet out of the bedroom and other rooms where you spend a great deal of time. . As with dust mites, vacuum carpets often or replace carpet with a hardwood floor, tile or linoleum. . High-efficiency particulate air (HEPA) cleaners can reduce allergen levels over time. . While dander and saliva are the source of cat and dog allergens, urine is the source of allergens from rabbits, hamsters, mice and [place] pigs; so ask a non-allergic family member to clean the animal's cage. . If you have a pet allergy, talk to your allergist about the potential for allergy immunotherapy (allergy shots). This strategy can often provide long-term relief.  Mold Control . Mold and  fungi can grow on a variety of surfaces provided certain temperature and moisture conditions exist.  . Outdoor molds grow on plants, decaying vegetation and soil. The major outdoor mold, Alternaria and Cladosporium, are found in very high numbers during hot and dry conditions. Generally, a late summer - fall peak is seen for common outdoor fungal  spores. Rain will temporarily lower outdoor mold spore count, but counts rise rapidly when the rainy period ends. . The most important indoor molds are Aspergillus and Penicillium. Dark, humid and poorly ventilated basements are ideal sites for mold growth. The next most common sites of mold growth are the bathroom and the kitchen. Outdoor (Seasonal) Mold Control . Use air conditioning and keep windows closed. . Avoid exposure to decaying vegetation. Marland Kitchen Avoid leaf raking. . Avoid grain handling. . Consider wearing a face mask if working in moldy areas.  Indoor (Perennial) Mold Control  . Maintain humidity below 50%. . Get rid of mold growth on hard surfaces with water, detergent and, if necessary, 5% bleach (do not mix with other cleaners). Then dry the area completely. If mold covers an area more than 10 square feet, consider hiring an indoor environmental professional. . For clothing, washing with soap and water is best. If moldy items cannot be cleaned and dried, throw them away. . Remove sources e.g. contaminated carpets. . Repair and seal leaking roofs or pipes. Using dehumidifiers in damp basements may be helpful, but empty the water and clean units regularly to prevent mildew from forming. All rooms, especially basements, bathrooms and kitchens, require ventilation and cleaning to deter mold and mildew growth. Avoid carpeting on concrete or damp floors, and storing items in damp areas.

## 2018-11-08 NOTE — Assessment & Plan Note (Signed)
Perennial rhinitis symptoms for the past 2-3 years with worsening in the fall. Used Zyrtec and Nasacort as needed with some benefit. ENT evaluation showed some deviated septum.  Today's skin testing showed: Positive to dust mites, dog and mold. She has 1 new dog for less than 1 year which sleeps on her bed.   Start environmental control measures.  May use zyrtec 10mg  daily.  May use Nasacort 1-2 sprays in each nostril daily.   Nasal saline spray (i.e., Simply Saline) or nasal saline lavage (i.e., NeilMed) is recommended as needed and prior to medicated nasal sprays.

## 2018-11-08 NOTE — Assessment & Plan Note (Addendum)
Dyspnea especially with exertion and at times dry coughing for the past 3 years. Patient being followed by cardiology for sinus node dysfunction s/p pacemaker and paroxsymal afib. Recent echo results pending. PFTs not done yet due to COVID-19 pandemic. No previous diagnosis of asthma or reactive airway disease. No previous inhaler use.  Today's spirometry did not show any over abnormalities given today's efforts.  Ordered full body PFT to further assess lung function.   Advised patient to use albuterol during these episodes of extreme SOB to see if it helps her symptoms.  Discussed at length that dyspnea can be caused by multiple issues and she should get evaluated during these episodes o see how her BP, EKG, HR and pulse ox is doing. She states she doesn't want to bother anyone when these episodes occur so she just stays at home. I'm concerned if there's something else triggering these episodes other than her lungs.   May use albuterol rescue inhaler 2 puffs or nebulizer every 4 to 6 hours as needed for shortness of breath, chest tightness, coughing, and wheezing. May use albuterol rescue inhaler 2 puffs 5 to 15 minutes prior to strenuous physical activities. Monitor frequency of use.
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
## 2018-11-10 LAB — CUP PACEART REMOTE DEVICE CHECK
Date Time Interrogation Session: 20200530090658
Implantable Lead Implant Date: 20170921
Implantable Lead Implant Date: 20170921
Implantable Lead Location: 753859
Implantable Lead Location: 753860
Implantable Lead Model: 377
Implantable Lead Model: 377
Implantable Lead Serial Number: 49575519
Implantable Lead Serial Number: 49580836
Implantable Pulse Generator Implant Date: 20170921
Pulse Gen Model: 407145
Pulse Gen Serial Number: 68850891

## 2018-11-14 ENCOUNTER — Encounter: Payer: Self-pay | Admitting: Cardiology

## 2018-11-14 NOTE — Progress Notes (Signed)
Remote pacemaker transmission.   

## 2018-11-15 ENCOUNTER — Telehealth (HOSPITAL_COMMUNITY): Payer: Self-pay

## 2018-11-15 NOTE — Telephone Encounter (Signed)
Pt called requesting results of echo.  Results endorsed to patient. Pt also notes that she saw an ENT doctor that prescribed albuterol.  Pt asking if it is ok to take. Please advise  Pt also inquiring about PFT's, advised that as office is moving into phase two, she is on list to be called for PFT's.   Routed to Dr Aundra Dubin

## 2018-11-15 NOTE — Telephone Encounter (Signed)
Pt made aware she can take albuterol.  Pt aware she will get call to schedule PFT's when they start scheduling. Pt appreciative.

## 2018-11-15 NOTE — Telephone Encounter (Signed)
Ok to take albuterol.  Echo showed normal LV and RV systolic function, no valvular problems.

## 2018-12-06 ENCOUNTER — Telehealth: Payer: Self-pay | Admitting: Gastroenterology

## 2018-12-06 NOTE — Telephone Encounter (Signed)
Pt reported that since her colonoscopy in March 2020, she has been having irregular BMs and currently experiencing blood in stool.

## 2018-12-07 NOTE — Telephone Encounter (Signed)
Pt states she has already been scheduled for an OV with Dr. Fuller Plan. She states since her colon she has had a decreased appetite, stomach cramps and has had BRBPR. Pt knows to keep the OV she scheduled, if her symptoms get worse prior to appt she will call back.

## 2018-12-31 ENCOUNTER — Other Ambulatory Visit (HOSPITAL_COMMUNITY): Payer: Self-pay | Admitting: *Deleted

## 2019-01-01 ENCOUNTER — Telehealth: Payer: Self-pay

## 2019-01-01 NOTE — Telephone Encounter (Signed)
Covid-19 screening questions   Do you now or have you had a fever in the last 14 days? No  Do you have any respiratory symptoms of shortness of breath or cough now or in the last 14 days? No  Do you have any family members or close contacts with diagnosed or suspected Covid-19 in the past 14 days? No  Have you been tested for Covid-19 and found to be positive? No        

## 2019-01-02 ENCOUNTER — Ambulatory Visit: Payer: Medicare Other | Admitting: Gastroenterology

## 2019-01-02 ENCOUNTER — Encounter: Payer: Self-pay | Admitting: Gastroenterology

## 2019-01-02 VITALS — BP 112/78 | HR 68 | Temp 98.2°F | Wt 163.0 lb

## 2019-01-02 DIAGNOSIS — K5900 Constipation, unspecified: Secondary | ICD-10-CM | POA: Diagnosis not present

## 2019-01-02 DIAGNOSIS — R197 Diarrhea, unspecified: Secondary | ICD-10-CM | POA: Diagnosis not present

## 2019-01-02 DIAGNOSIS — K921 Melena: Secondary | ICD-10-CM | POA: Diagnosis not present

## 2019-01-02 MED ORDER — DICYCLOMINE HCL 10 MG PO CAPS: 10.0000 mg | ORAL_CAPSULE | Freq: Three times a day (TID) | ORAL | 11 refills | Status: DC

## 2019-01-02 NOTE — Progress Notes (Signed)
    History of Present Illness: This is a 73 year old female with a long history of alternating diarrhea and constipation occasional fecal urgency and intermittent cramping lower abdominal pain.  She is been diagnosed with IBS.  In the past her symptoms responded very well to VSL#3 however she felt it was too expensive to continue.  She notes intermittent small amounts of bright red blood and occasionally small amounts of dark red blood per rectum following bowel movements.  Prior hemorrhoidal banding by Dr. Marcello Moores in 2016. Recent colonoscopy as below.   Colonoscopy 08/2018 - Internal, external hemorrhoids - Cecal polyp, sessile, removed by cold snare. - Mild left colon diverticulosis. - Otherwise normal appearing colonoscopy.  Current Medications, Allergies, Past Medical History, Past Surgical History, Family History and Social History were reviewed in [employer] record.   Physical Exam: General: Well developed, well nourished, no acute distress Head: Normocephalic and atraumatic Eyes:  sclerae anicteric, EOMI Ears: Normal auditory acuity Mouth: No deformity or lesions Lungs: Clear throughout to auscultation Heart: Regular rate and rhythm; no murmurs, rubs or bruits Abdomen: Soft, non tender and non distended. No masses, hepatosplenomegaly or hernias noted. Normal Bowel sounds Rectal: As per recent colonoscopy, not repeated Musculoskeletal: Symmetrical with no gross deformities  Pulses:  Normal pulses noted Extremities: No clubbing, cyanosis, edema or deformities noted Neurological: Alert oriented x 4, grossly nonfocal Psychological:  Alert and cooperative. Normal mood and affect   Assessment and Recommendations:  1.  IBS with alternating diarrhea and constipation.  Begin dicyclomine 10 mg 3 times daily AC.  Begin a trial of 4 different probiotics as outlined below each for 2 weeks and if 1 is beneficial she can remain on it. If none are beneficial consider  resuming VSL #3. Continue Miralax daily as needed for constipation. REV in 2 months.   2.  Hematochezia due to internal and external hemorrhoids. Discussed hemorrhoidal banding when her IBS is under better control. Anusol HC cream externally and PR bid prn. Rectal care instructions.   3. Personal history of adenomatous colon polyps. Consider surveillance colonoscopy in 7 years.

## 2019-01-02 NOTE — Patient Instructions (Signed)
We have sent the following medications to your pharmacy for you to pick up at your convenience: dicyclomine.   Some examples of probiotics are Align, Florastor, Restora or phillips colon health. Try each of these for two weeks at a time and see if this help your symptoms.   We have given you samples of Align to take daily to get you started.  We have given you information on hemorrhoidal banding.   Thank you for choosing me and [facility].  Pricilla Riffle. Dagoberto Ligas., MD., Marval Regal

## 2019-01-04 ENCOUNTER — Other Ambulatory Visit (HOSPITAL_COMMUNITY): Payer: Self-pay

## 2019-01-04 ENCOUNTER — Other Ambulatory Visit (HOSPITAL_COMMUNITY): Payer: Medicare Other

## 2019-01-07 ENCOUNTER — Other Ambulatory Visit (HOSPITAL_COMMUNITY)
Admission: RE | Admit: 2019-01-07 | Discharge: 2019-01-07 | Disposition: A | Discharge status: Home / Self Care | Payer: Medicare Other | Source: Ambulatory Visit | Attending: Internal Medicine | Admitting: Internal Medicine

## 2019-01-07 DIAGNOSIS — Z20828 Contact with and (suspected) exposure to other viral communicable diseases: Secondary | ICD-10-CM | POA: Insufficient documentation

## 2019-01-07 LAB — SARS CORONAVIRUS 2 (TAT 6-24 HRS): SARS Coronavirus 2: NEGATIVE

## 2019-01-09 ENCOUNTER — Ambulatory Visit: Payer: Medicare Other | Admitting: Allergy

## 2019-01-10 ENCOUNTER — Other Ambulatory Visit: Payer: Self-pay

## 2019-01-10 ENCOUNTER — Telehealth (HOSPITAL_COMMUNITY): Payer: Self-pay

## 2019-01-10 ENCOUNTER — Ambulatory Visit (HOSPITAL_COMMUNITY)
Admission: RE | Admit: 2019-01-10 | Discharge: 2019-01-10 | Disposition: A | Discharge status: Home / Self Care | Payer: Medicare Other | Source: Ambulatory Visit | Attending: Cardiology | Admitting: Cardiology

## 2019-01-10 DIAGNOSIS — R0602 Shortness of breath: Secondary | ICD-10-CM

## 2019-01-10 LAB — PULMONARY FUNCTION TEST
DL/VA % pred: 94 %
DL/VA: 3.91 ml/min/mmHg/L
DLCO unc % pred: 103 %
DLCO unc: 19.01 ml/min/mmHg
FEF 25-75 Post: 3.32 L/sec
FEF 25-75 Pre: 2.61 L/sec
FEF2575-%Change-Post: 27 %
FEF2575-%Pred-Post: 198 %
FEF2575-%Pred-Pre: 155 %
FEV1-%Change-Post: 6 %
FEV1-%Pred-Post: 133 %
FEV1-%Pred-Pre: 125 %
FEV1-Post: 2.72 L
FEV1-Pre: 2.56 L
FEV1FVC-%Change-Post: 5 %
FEV1FVC-%Pred-Pre: 105 %
FEV6-%Change-Post: 0 %
FEV6-%Pred-Post: 124 %
FEV6-%Pred-Pre: 123 %
FEV6-Post: 3.2 L
FEV6-Pre: 3.19 L
FEV6FVC-%Change-Post: 0 %
FEV6FVC-%Pred-Post: 105 %
FEV6FVC-%Pred-Pre: 104 %
FVC-%Change-Post: 0 %
FVC-%Pred-Post: 119 %
FVC-%Pred-Pre: 118 %
FVC-Post: 3.24 L
FVC-Pre: 3.21 L
Post FEV1/FVC ratio: 84 %
Post FEV6/FVC ratio: 100 %
Pre FEV1/FVC ratio: 80 %
Pre FEV6/FVC Ratio: 99 %
RV % pred: 80 %
RV: 1.76 L
TLC % pred: 107 %
TLC: 5.2 L

## 2019-01-10 MED ORDER — ALBUTEROL SULFATE (2.5 MG/3ML) 0.083% IN NEBU
2.5000 mg | INHALATION_SOLUTION | Freq: Once | RESPIRATORY_TRACT | Status: AC
  Administered 2019-01-10: 2.5 mg via RESPIRATORY_TRACT

## 2019-01-10 NOTE — Telephone Encounter (Signed)
Pt aware of pft results.

## 2019-01-10 NOTE — Telephone Encounter (Signed)
-----   Message from Larey Dresser, MD sent at 01/10/2019 12:55 PM EDT ----- Normal PFTs.

## 2019-01-16 ENCOUNTER — Telehealth: Payer: Self-pay | Admitting: Cardiology

## 2019-01-16 NOTE — Telephone Encounter (Signed)
Pt called and stated that she got a message either on mychart or her gmail account that there was an alert on her device. After consulting w/ the Device Tech RN I informed pt that there was no abnormal readings for her device. Pt was adamant that she seen something but couldn't remember where and when she gets home she is going to find it and call back.

## 2019-02-01 ENCOUNTER — Other Ambulatory Visit (HOSPITAL_COMMUNITY): Payer: Self-pay | Admitting: Cardiology

## 2019-02-01 ENCOUNTER — Other Ambulatory Visit: Payer: Self-pay | Admitting: Internal Medicine

## 2019-02-01 MED ORDER — ZOLPIDEM TARTRATE 5 MG PO TABS: 5.0000 mg | ORAL_TABLET | Freq: Every evening | ORAL | 1 refills | Status: DC | PRN

## 2019-02-01 NOTE — Telephone Encounter (Signed)
Done erx 

## 2019-02-04 ENCOUNTER — Ambulatory Visit: Payer: Medicare Other | Admitting: Allergy

## 2019-02-07 ENCOUNTER — Ambulatory Visit (INDEPENDENT_AMBULATORY_CARE_PROVIDER_SITE_OTHER): Payer: Medicare Other | Admitting: *Deleted

## 2019-02-07 DIAGNOSIS — R55 Syncope and collapse: Secondary | ICD-10-CM | POA: Diagnosis not present

## 2019-02-08 LAB — CUP PACEART REMOTE DEVICE CHECK
Date Time Interrogation Session: 20200828125906
Implantable Lead Implant Date: 20170921
Implantable Lead Implant Date: 20170921
Implantable Lead Location: 753859
Implantable Lead Location: 753860
Implantable Lead Model: 377
Implantable Lead Model: 377
Implantable Lead Serial Number: 49575519
Implantable Lead Serial Number: 49580836
Implantable Pulse Generator Implant Date: 20170921
Pulse Gen Model: 407145
Pulse Gen Serial Number: 68850891

## 2019-02-11 ENCOUNTER — Telehealth: Payer: Self-pay | Admitting: Internal Medicine

## 2019-02-11 NOTE — Telephone Encounter (Signed)
Per Dr. Lovena Le- have patient send a transmission.  Advise Pt when you call to have her write down the date/times of her "spells" so we can correlate with rhythms.  If no correlation of symptoms with anything on her remote refer her to her PCP.

## 2019-02-11 NOTE — Telephone Encounter (Signed)
LMOVM requesting call back from patient. Gave direct DC phone number for return call.  Monitor updated automatically overnight--no atrial or ventricular episodes noted in the past 24hrs. AP 70%, VP 1%.

## 2019-02-11 NOTE — Telephone Encounter (Signed)
New message    Pt would like a call back from RN. She states that she is having these spells and no one knows what it is but she feels like it is her heart. She said she feels like she has no quality of life. She said Dr. Aundra Dubin told her she may have to have another ablation. Please call.

## 2019-02-12 NOTE — Telephone Encounter (Signed)
Pt returning your call. She stated that she does not answer unidentified number. I gave her the [facility] number and the [facility] number to look for on her caller ID. Pt verbalized understanding.

## 2019-02-12 NOTE — Telephone Encounter (Signed)
Spoke with patient. Pt is "so near death" during her spells that she cannot do anything, including check her HR. She has minimal activity tolerance and "no quality of life" in recent months. Her spells involve extreme fatigue, palpitations, and "rapid heart rate" (though she has not checked her HR during an episode). Explained no episodes detected by device. Pt expresses frustration that her PPM has not detected any episodes. She requests an appointment with Dr. Lovena Le because she is sure that something is "going on" in her heart. Encouraged pt to f/u with PCP, but she declines, states "he doesn't know anything about me." Explained that many things can contribute to palpitations and fatigue, not just cardiac issues. Pt notes a history of thyroid nodules that she "gets checked" every 2 years by her oncologist. Pt thinks the last time these were checked was last year. Explained I will forward message to Dr. Lovena Le and Sonia Baller, RN for recommendations regarding appointment. Pt verbalizes understanding, no further questions at this time.

## 2019-02-12 NOTE — Progress Notes (Signed)
Remote pacemaker transmission.   

## 2019-02-14 ENCOUNTER — Other Ambulatory Visit: Payer: Self-pay | Admitting: Internal Medicine

## 2019-02-14 DIAGNOSIS — Z1231 Encounter for screening mammogram for malignant neoplasm of breast: Secondary | ICD-10-CM

## 2019-02-18 NOTE — Telephone Encounter (Signed)
Agree with above. If she cannot breath, has severe chest pain or passes out she will need to go to the ED.

## 2019-02-20 NOTE — Telephone Encounter (Signed)
Spoke with patient to advise of Dr. Tanna Furry recommendations. Explained that our scheduler will contact her if Dr. Lovena Le has any openings due to cancellations sooner than current f/u on 05/01/19. Pt states "I am not happy with this situation. Why are you calling me instead of Dr. Lovena Le?" Explained that this RN is a nurse who works with Dr. Lovena Le. Reiterated recommendations, including advice to f/u with PCP. Pt verbalizes understanding. No further questions at this time.

## 2019-02-21 ENCOUNTER — Telehealth: Payer: Self-pay

## 2019-02-21 DIAGNOSIS — R079 Chest pain, unspecified: Secondary | ICD-10-CM

## 2019-02-21 NOTE — Telephone Encounter (Signed)
Ok , but would she have a preference?

## 2019-02-21 NOTE — Telephone Encounter (Signed)
Copied from [place] [phone]. Topic: Referral - Question >> Feb 21, 2019  1:48 PM Sheran Luz wrote: Patient inquired if she could get another referral to cardiologist, as she would like a second opinion. Patient requesting call back from referral coordinator to discuss whether this is necessary.

## 2019-02-22 ENCOUNTER — Telehealth: Payer: Self-pay

## 2019-02-22 NOTE — Telephone Encounter (Signed)
Pt would like a referral to another cardiologist sent to:  Dimas Alexandria, MD Address: [address] [place], [place]  [address] Phone: [phone] Fax  [phone]

## 2019-02-22 NOTE — Telephone Encounter (Signed)
Done erx 

## 2019-02-22 NOTE — Telephone Encounter (Signed)
Copied from [place] [id number]. Topic: Referral - Question >> Feb 21, 2019  1:48 PM Sheran Luz wrote: Patient inquired if she could get another referral to cardiologist, as she would like a second opinion. Patient requesting call back from referral coordinator to discuss whether this is necessary. >> Feb 22, 2019  9:12 AM Scherrie Gerlach wrote: Pt would like the referral to the cardiologist sent to:  Dimas Alexandria, MD Address: [address] [place], [place] [address] Phone: [phone] Fax  [phone]

## 2019-02-22 NOTE — Telephone Encounter (Signed)
Spoke with patient will call back if she need a referral.

## 2019-02-22 NOTE — Addendum Note (Signed)
Addended by: Biagio Borg on: 02/22/2019 12:02 PM   Modules accepted: Orders

## 2019-02-25 ENCOUNTER — Other Ambulatory Visit: Payer: Self-pay | Admitting: Internal Medicine

## 2019-02-25 ENCOUNTER — Telehealth: Payer: Self-pay

## 2019-02-25 DIAGNOSIS — R1310 Dysphagia, unspecified: Secondary | ICD-10-CM | POA: Diagnosis not present

## 2019-02-25 DIAGNOSIS — E042 Nontoxic multinodular goiter: Secondary | ICD-10-CM

## 2019-02-25 DIAGNOSIS — R093 Abnormal sputum: Secondary | ICD-10-CM | POA: Diagnosis not present

## 2019-02-25 NOTE — Telephone Encounter (Signed)
-----   Message from Carlynn Purl sent at 02/25/2019  8:39 AM EDT -----  ----- Message ----- From: Biagio Borg, MD Sent: 02/22/2019   4:17 PM EDT To: Carlynn Purl  OK for shirron to let pt know, due to having no OV by my for her problem, the insurance will not cover the referral.  Perhaps she can ask her cardiologist she currently has to forward their notes so the insurance will accept the referral.  thanks ----- Message ----- From: Carlynn Purl Sent: 02/22/2019   2:07 PM EDT To: Biagio Borg, MD, Juliet Rude, CMA  This patient is going to need office visit I have no notes to send with referral. I can't send cardiologist  Notes. Pt can ask her cardiologist to do referral and they can send their notes.   Thanks

## 2019-02-25 NOTE — Telephone Encounter (Signed)
Called pt, LVM.   

## 2019-03-01 ENCOUNTER — Ambulatory Visit
Admission: RE | Admit: 2019-03-01 | Discharge: 2019-03-01 | Disposition: A | Discharge status: Home / Self Care | Payer: Medicare Other | Source: Ambulatory Visit | Attending: Internal Medicine | Admitting: Internal Medicine

## 2019-03-01 DIAGNOSIS — E042 Nontoxic multinodular goiter: Secondary | ICD-10-CM

## 2019-03-04 ENCOUNTER — Telehealth (HOSPITAL_COMMUNITY): Payer: Self-pay | Admitting: *Deleted

## 2019-03-04 NOTE — Telephone Encounter (Signed)
That would be fine 

## 2019-03-04 NOTE — Telephone Encounter (Signed)
Pt left VM stating she would like Dr.McLean to refer her to another cardiologist for a second opinion. Pt requests we send a referral to Dr.Kumar in [facility].[place].  If Dr.McLean agrees fax referral to Amy @ [phone]  Routed to [place] for advice

## 2019-03-05 ENCOUNTER — Ambulatory Visit (INDEPENDENT_AMBULATORY_CARE_PROVIDER_SITE_OTHER)
Admission: RE | Admit: 2019-03-05 | Discharge: 2019-03-05 | Disposition: A | Discharge status: Home / Self Care | Payer: Medicare Other | Source: Ambulatory Visit | Attending: Internal Medicine | Admitting: Internal Medicine

## 2019-03-05 ENCOUNTER — Other Ambulatory Visit (INDEPENDENT_AMBULATORY_CARE_PROVIDER_SITE_OTHER): Payer: Medicare Other

## 2019-03-05 ENCOUNTER — Ambulatory Visit (INDEPENDENT_AMBULATORY_CARE_PROVIDER_SITE_OTHER): Payer: Medicare Other | Admitting: Internal Medicine

## 2019-03-05 ENCOUNTER — Telehealth: Payer: Self-pay

## 2019-03-05 ENCOUNTER — Encounter: Payer: Self-pay | Admitting: Internal Medicine

## 2019-03-05 ENCOUNTER — Other Ambulatory Visit: Payer: Self-pay

## 2019-03-05 VITALS — BP 132/86 | HR 87 | Temp 98.1°F | Ht 62.5 in | Wt 164.0 lb

## 2019-03-05 DIAGNOSIS — Z0001 Encounter for general adult medical examination with abnormal findings: Secondary | ICD-10-CM | POA: Diagnosis not present

## 2019-03-05 DIAGNOSIS — R05 Cough: Secondary | ICD-10-CM | POA: Diagnosis not present

## 2019-03-05 DIAGNOSIS — E559 Vitamin D deficiency, unspecified: Secondary | ICD-10-CM | POA: Diagnosis not present

## 2019-03-05 DIAGNOSIS — E162 Hypoglycemia, unspecified: Secondary | ICD-10-CM | POA: Diagnosis not present

## 2019-03-05 DIAGNOSIS — Z23 Encounter for immunization: Secondary | ICD-10-CM | POA: Diagnosis not present

## 2019-03-05 DIAGNOSIS — E611 Iron deficiency: Secondary | ICD-10-CM | POA: Diagnosis not present

## 2019-03-05 DIAGNOSIS — E538 Deficiency of other specified B group vitamins: Secondary | ICD-10-CM

## 2019-03-05 DIAGNOSIS — Z0000 Encounter for general adult medical examination without abnormal findings: Secondary | ICD-10-CM | POA: Diagnosis not present

## 2019-03-05 DIAGNOSIS — R053 Chronic cough: Secondary | ICD-10-CM

## 2019-03-05 DIAGNOSIS — R399 Unspecified symptoms and signs involving the genitourinary system: Secondary | ICD-10-CM

## 2019-03-05 DIAGNOSIS — F411 Generalized anxiety disorder: Secondary | ICD-10-CM

## 2019-03-05 DIAGNOSIS — R339 Retention of urine, unspecified: Secondary | ICD-10-CM

## 2019-03-05 LAB — URINALYSIS, ROUTINE W REFLEX MICROSCOPIC
Bilirubin Urine: NEGATIVE
Hgb urine dipstick: NEGATIVE
Ketones, ur: NEGATIVE
Leukocytes,Ua: NEGATIVE
Nitrite: NEGATIVE
RBC / HPF: NONE SEEN (ref 0–?)
Specific Gravity, Urine: 1.015 (ref 1.000–1.030)
Total Protein, Urine: NEGATIVE
Urine Glucose: NEGATIVE
Urobilinogen, UA: 0.2 (ref 0.0–1.0)
pH: 7.5 (ref 5.0–8.0)

## 2019-03-05 LAB — LIPID PANEL
Cholesterol: 153 mg/dL (ref 0–200)
HDL: 66.5 mg/dL (ref >39.00)
LDL Cholesterol: 66 mg/dL (ref 0–99)
NonHDL: 86.54
Total CHOL/HDL Ratio: 2
Triglycerides: 102 mg/dL (ref 0.0–149.0)
VLDL: 20.4 mg/dL (ref 0.0–40.0)

## 2019-03-05 LAB — VITAMIN D 25 HYDROXY (VIT D DEFICIENCY, FRACTURES): VITD: 31.91 ng/mL (ref 30.00–100.00)

## 2019-03-05 LAB — CBC WITH DIFFERENTIAL/PLATELET
Basophils Absolute: 0.1 10*3/uL (ref 0.0–0.1)
Basophils Relative: 1.3 % (ref 0.0–3.0)
Eosinophils Absolute: 0.2 10*3/uL (ref 0.0–0.7)
Eosinophils Relative: 3.3 % (ref 0.0–5.0)
HCT: 42.5 % (ref 36.0–46.0)
Hemoglobin: 14.1 g/dL (ref 12.0–15.0)
Lymphocytes Relative: 36.8 % (ref 12.0–46.0)
Lymphs Abs: 2 10*3/uL (ref 0.7–4.0)
MCHC: 33 g/dL (ref 30.0–36.0)
MCV: 84.1 fl (ref 78.0–100.0)
Monocytes Absolute: 0.3 10*3/uL (ref 0.1–1.0)
Monocytes Relative: 5.2 % (ref 3.0–12.0)
Neutro Abs: 2.9 10*3/uL (ref 1.4–7.7)
Neutrophils Relative %: 53.4 % (ref 43.0–77.0)
Platelets: 258 10*3/uL (ref 150.0–400.0)
RBC: 5.06 Mil/uL (ref 3.87–5.11)
RDW: 13.2 % (ref 11.5–15.5)
WBC: 5.5 10*3/uL (ref 4.0–10.5)

## 2019-03-05 LAB — TSH: TSH: 1.24 u[IU]/mL (ref 0.35–4.50)

## 2019-03-05 LAB — BASIC METABOLIC PANEL
BUN: 12 mg/dL (ref 6–23)
CO2: 26 mEq/L (ref 19–32)
Calcium: 10.2 mg/dL (ref 8.4–10.5)
Chloride: 103 mEq/L (ref 96–112)
Creatinine, Ser: 0.77 mg/dL (ref 0.40–1.20)
GFR: 73.37 mL/min (ref >60.00)
Glucose, Bld: 96 mg/dL (ref 70–99)
Potassium: 3.9 mEq/L (ref 3.5–5.1)
Sodium: 138 mEq/L (ref 135–145)

## 2019-03-05 LAB — IBC PANEL
Iron: 90 ug/dL (ref 42–145)
Saturation Ratios: 21.9 % (ref 20.0–50.0)
Transferrin: 293 mg/dL (ref 212.0–360.0)

## 2019-03-05 LAB — HEPATIC FUNCTION PANEL
ALT: 12 U/L (ref 0–35)
AST: 14 U/L (ref 0–37)
Albumin: 4.5 g/dL (ref 3.5–5.2)
Alkaline Phosphatase: 90 U/L (ref 39–117)
Bilirubin, Direct: 0.1 mg/dL (ref 0.0–0.3)
Total Bilirubin: 0.7 mg/dL (ref 0.2–1.2)
Total Protein: 7.3 g/dL (ref 6.0–8.3)

## 2019-03-05 LAB — HEMOGLOBIN A1C: Hgb A1c MFr Bld: 5.8 % (ref 4.6–6.5)

## 2019-03-05 LAB — VITAMIN B12: Vitamin B-12: 331 pg/mL (ref 211–911)

## 2019-03-05 NOTE — Assessment & Plan Note (Signed)

## 2019-03-05 NOTE — Assessment & Plan Note (Signed)
Severe with poor insight, not amenable to tx,  to f/u any worsening symptoms or concerns

## 2019-03-05 NOTE — Patient Instructions (Addendum)
You had the flu shot today  Please continue all other medications as before, and refills have been done if requested.  Please have the pharmacy call with any other refills you may need.  Please continue your efforts at being more active, low cholesterol diet, and weight control.  You are otherwise up to date with prevention measures today.  Please keep your appointments with your specialists as you may have planned  Please go to the XRAY Department in the Basement (go straight as you get off the elevator) for the x-ray testing  Please go to the LAB in the Basement (turn left off the elevator) for the tests to be done today  You will be contacted by phone if any changes need to be made immediately.  Otherwise, you will receive a letter about your results with an explanation, but please check with MyChart first.  Please remember to sign up for MyChart if you have not done so, as this will be important to you in the future with finding out test results, communicating by private email, and scheduling acute appointments online when needed.  Please return in 6 months, or sooner if needed, with Lab testing done 3-5 days before

## 2019-03-05 NOTE — Telephone Encounter (Signed)
Lab orders entered

## 2019-03-05 NOTE — Assessment & Plan Note (Signed)
Etiology unexplained after multiple specialty evaluations, and argues against any attempt now at diagnosis as if going over old ground, then c/o being frustrated

## 2019-03-05 NOTE — Telephone Encounter (Signed)
Referral faxed to Amy at [facility] office

## 2019-03-05 NOTE — Assessment & Plan Note (Addendum)
?   uti - for ua and cx with labs  In addition to the time spent performing CPE, I spent an additional 25 minutes face to face,in which greater than 50% of this time was spent in counseling and coordination of care for patient's acute illness as documented, including the differential dx, treatment, further evaluation and other management of urinary retention, chronic cough, low blood sugar, anxiety

## 2019-03-05 NOTE — Assessment & Plan Note (Signed)
Improved symptoms amptomsfter several months earlier this yr of pt reported sy

## 2019-03-05 NOTE — Progress Notes (Signed)
Subjective:    Patient ID: Renee [name], female    DOB: [birth year], 72 y.o.   MRN: [id number]  HPI    Here for wellness and f/u;  Overall doing ok;  Pt denies Chest pain, worsening SOB, DOE, wheezing, orthopnea, PND, worsening LE edema, palpitations, dizziness or syncope.  Pt denies neurological change such as new headache, facial or extremity weakness.  Pt denies polydipsia, polyuria, or low sugar symptoms. Pt states overall good compliance with treatment and medications, good tolerability, and has been trying to follow appropriate diet.  Pt denies worsening depressive symptoms, suicidal ideation or panic. No fever, night sweats, wt loss, loss of appetite, or other constitutional symptoms.  Pt states good ability with ADL's, has low fall risk, home safety reviewed and adequate, no other significant changes in hearing or vision, and only occasionally active with exercise. Doe still c/o thick white phelgm in throat with persistent clearing of the throat and chronic cough, despite numerous specialist evaluations and no asthma or allergies per allergist Dr Maudie Mercury evaluation, and ENT evaluation.  Did see endocrinology with thyroid testing, very upset only TSH was done, and not happy with that.  Also has seen cardiology with persistent sweating, now wanting a second opinion since no one can seem to explain this as well.  S/p TAH and bladder surgury in past, could not urinate last night when usually has some leakage, has some mild low abd soreness today,   Denies urinary symptoms such as dysuria, frequency, urgency, flank pain, hematuria or n/v, fever, chills. Past Medical History:  Diagnosis Date  . Allergy    tape, electrodes  . Anxiety   . AV nodal re-entry tachycardia [facility])    a. s/p RFA 1993- Dr. Rolland Porter, [place]  . Broken neck ([place])    1993  . Cancer (HCC)    hx of cervical   ca  . Chronic chest pain    a. 2005 Cath: Mild nonobstructive plaque (30-40% LCX);  b. 08/2011 low risk myoview;  c. 09/2011  Coronary CT: NL cors w ca score of 0.  . Chronic pain syndrome   . Complication of anesthesia    blood pressure and heart dropped in 2009    . Concussion    1979, 1993  . Degenerative joint disease    a. s/p L Total Knee Arthroplasty.  . Diet-controlled type 2 diabetes mellitus ([place])    pt. denies  . Diverticulosis of colon   . Duodenitis without hemorrhage   . Dysrhythmia    REPORTS , SINCE PLACEMENT OF PACEMAKER, STILL CAN FEEL HEART " GOING IN AND OUT OF RYTHYN" REPORTS, YESTERDAY HER BP SYSTOLIC WAS IN THE 030S AND WAS BREATHLESS, HR 112, SWEATING ; DENIES SYNCOPE  , DENIES CHEST PAIN   . Fibromyalgia   . GERD (gastroesophageal reflux disease)   . Hemorrhoids   . Hypertension   . Irritable bowel syndrome   . Long QT interval    a. mild - advised to avoid meds that may prolong QT.  Marland Kitchen Myocardial infarction [facility]) 09/1991   does not know when second heart attack was  . Neuropathy   . Osteoporosis   . Paroxysmal A-fib (HCC)    PER PATIENT. I CAN FEEL MY HEART RACING WHEN IM IN IT   . Recurrent upper respiratory infection (URI)   . SVT (supraventricular tachycardia) (HCC)    a. nonsustained SVT - previously offered flecainide but refused;  b. 12/2012 30 day event monitor w/o significant arrhythmias.  Marland Kitchen  Thyroid nodule    Past Surgical History:  Procedure Laterality Date  . ABLATION OF DYSRHYTHMIC FOCUS  1993  . ANTERIOR AND POSTERIOR REPAIR N/A 08/01/2017   Procedure: ANTERIOR (CYSTOCELE) AND POSTERIOR REPAIR (RECTOCELE);  Surgeon: Bjorn Loser, MD;  Location: [facility];  Service: Urology;  Laterality: N/A;  . APPENDECTOMY     1980  . BILATERAL SALPINGECTOMY Left 08/01/2017   Procedure: SALPINGECTOMY;  Surgeon: Bobbye Charleston, MD;  Location: [facility];  Service: Gynecology;  Laterality: Left;  . BIOPSY THYROID    . CARDIAC ELECTROPHYSIOLOGY STUDY AND ABLATION     atrioventricular nodal reentant tachycardia  . CHOLECYSTECTOMY  1980  . COLONOSCOPY  06/25/2008   Diverticulosis  and Hemorrhoids  . COLPOSCOPY    . CORONARY ANGIOPLASTY  2007  . CYSTOSCOPY N/A 08/01/2017   Procedure: CYSTOSCOPY;  Surgeon: Bjorn Loser, MD;  Location: [facility];  Service: Urology;  Laterality: N/A;  . EP IMPLANTABLE DEVICE N/A 03/03/2016   Procedure: Pacemaker Implant;  Surgeon: Evans Lance, MD;  Location: [facility];  Service: Cardiovascular;  Laterality: N/A;  . ESOPHAGOGASTRODUODENOSCOPY    . HYSTEROSCOPY  2002   with resection of endometrial polyps. by Dr.MCPhail  . HYSTEROSCOPY W/D&C N/A 02/05/2015   Procedure: DILATATION AND CURETTAGE /HYSTEROSCOPY;  Surgeon: Vanessa Kick, MD;  Location: [facility];  Service: Gynecology;  Laterality: N/A;  . LOOP RECORDER IMPLANT  08/21/2013   MDT LinQ implanted by Dr Lovena Le for syncope  . LOOP RECORDER IMPLANT N/A 08/21/2013   Procedure: LOOP RECORDER IMPLANT;  Surgeon: Evans Lance, MD;  Location: [facility] CATH LAB;  Service: Cardiovascular;  Laterality: N/A;  . ORIF RADIAL HEAD / NECK FRACTURE  09/05/88  . TILT TABLE STUDY N/A 05/15/2013   Procedure: TILT TABLE STUDY;  Surgeon: Deboraha Sprang, MD;  Location: [facility] CATH LAB;  Service: Cardiovascular;  Laterality: N/A;  . TOTAL HIP ARTHROPLASTY  10/10   Left, at [place]    . VAGINAL HYSTERECTOMY N/A 08/01/2017   Procedure: HYSTERECTOMY VAGINAL;  Surgeon: Bobbye Charleston, MD;  Location: [facility];  Service: Gynecology;  Laterality: N/A;    reports that she has never smoked. She has never used smokeless tobacco. She reports current alcohol use. She reports that she does not use drugs. family history includes Asthma in her mother and sister; Diabetes in her brother, mother, and sister; Heart disease in her sister and sister; Lung cancer in her father; Ovarian cancer in her mother; Stroke in her mother. Allergies  Allergen Reactions  . Cyclobenzaprine Other (See Comments)    Pt states that this medication does not work for her.    . Azithromycin Other (See  Comments)    Cannot take due to prolonged QT  . Nitrofurantoin Other (See Comments)  . Adhesive [Tape] Itching, Rash and Other (See Comments)    Reaction:  Blisters  Pt states that she is allergic to the adhesive backing of EKG pads if left on for more than 24 hours Patient states she tolerates paper tape ok  . Moxifloxacin Other (See Comments)    Unknown allergic reaction many years ago  . Quinolones Other (See Comments)    Unknown allergic reaction many years ago   Current Outpatient Medications on File Prior to Visit  Medication Sig Dispense Refill  . ACCU-CHEK FASTCLIX LANCETS MISC USE UP TO 4 TIMES DAILY AS  DIRECTED 408 each 1  . ACCU-CHEK GUIDE test strip USE UP TO 4 TIMES DAILY AS  DIRECTED 100 each 1  . albuterol (PROAIR HFA) 108 (90 Base) MCG/ACT inhaler Inhale 2 puffs into the lungs every 4 (four) hours as needed for wheezing or shortness of breath. 1 Inhaler 3  . ALPRAZolam (XANAX) 0.5 MG tablet Take 0.5-1 tablets (0.25-0.5 mg total) by mouth 2 (two) times daily as needed for anxiety. 90 tablet 1  . blood glucose meter kit and supplies KIT Dispense per patient and insurance preferred. Use up to four times daily as directed. E11.9 1 each 0  . cetirizine (ZYRTEC) 10 MG tablet Take 10 mg by mouth daily.    Marland Kitchen dicyclomine (BENTYL) 10 MG capsule Take 1 capsule (10 mg total) by mouth 3 (three) times daily before meals. 90 capsule 11  . diltiazem (CARDIZEM CD) 240 MG 24 hr capsule TAKE 1 CAPSULE BY MOUTH  EVERY MORNING 90 capsule 3  . flecainide (TAMBOCOR) 100 MG tablet TAKE 1 TABLET BY MOUTH TWO  TIMES DAILY 180 tablet 1  . nitroGLYCERIN (NITROSTAT) 0.4 MG SL tablet Place 1 tablet (0.4 mg total) under the tongue every 5 (five) minutes as needed for chest pain. 30 tablet 0  . omeprazole (PRILOSEC) 40 MG capsule TAKE 1 CAPSULE BY MOUTH  EVERY EVENING 90 capsule 2  . sodium chloride (OCEAN) 0.65 % SOLN nasal spray Place 1-2 sprays into both nostrils 4 (four) times daily as needed for  congestion.    . triamcinolone (NASACORT) 55 MCG/ACT AERO nasal inhaler Place 2 sprays into the nose daily. 1 Inhaler 12  . triamterene-hydrochlorothiazide (DYAZIDE) 37.5-25 MG capsule TAKE 1 CAPSULE BY MOUTH  DAILY 90 capsule 1  . zolpidem (AMBIEN) 5 MG tablet Take 1 tablet (5 mg total) by mouth at bedtime as needed for sleep. 90 tablet 1  . potassium chloride (K-DUR) 10 MEQ tablet Take 1 tablet (10 mEq total) by mouth daily. 90 tablet 3   No current facility-administered medications on file prior to visit.    Review of Systems Constitutional: Negative for other unusual diaphoresis, sweats, appetite or weight changes HENT: Negative for other worsening hearing loss, ear pain, facial swelling, mouth sores or neck stiffness.   Eyes: Negative for other worsening pain, redness or other visual disturbance.  Respiratory: Negative for other stridor or swelling Cardiovascular: Negative for other palpitations or other chest pain  Gastrointestinal: Negative for worsening diarrhea or loose stools, blood in stool, distention or other pain Genitourinary: Negative for hematuria, flank pain or other change in urine volume.  Musculoskeletal: Negative for myalgias or other joint swelling.  Skin: Negative for other color change, or other wound or worsening drainage.  Neurological: Negative for other syncope or numbness. Hematological: Negative for other adenopathy or swelling Psychiatric/Behavioral: Negative for hallucinations, other worsening agitation, SI, self-injury, or new decreased concentration All otherwise neg per pt      Objective:   Physical Exam BP 132/86   Pulse 87   Temp 98.1 F (36.7 C) (Oral)   Ht 5' 2.5" (1.588 m)   Wt 164 lb (74.4 kg)   SpO2 98%   BMI 29.52 kg/m  VS noted,  Constitutional: Pt is oriented to person, place, and time. Appears well-developed and well-nourished, in no significant distress and comfortable Head: Normocephalic and atraumatic  Eyes: Conjunctivae and EOM  are normal. Pupils are equal, round, and reactive to light Right Ear: External ear normal without discharge Left Ear: External ear normal without discharge Nose: Nose without discharge or deformity Mouth/Throat: Oropharynx is without other ulcerations and moist  Neck: Normal range  of motion. Neck supple. No JVD present. No tracheal deviation present or significant neck LA or mass Cardiovascular: Normal rate, regular rhythm, normal heart sounds and intact distal pulses.   Pulmonary/Chest: WOB normal and breath sounds without rales or wheezing  Abdominal: Soft. Bowel sounds are normal. NT. No HSM  Musculoskeletal: Normal range of motion. Exhibits no edema Lymphadenopathy: Has no other cervical adenopathy.  Neurological: Pt is alert and oriented to person, place, and time. Pt has normal reflexes. No cranial nerve deficit. Motor grossly intact, Gait intact Skin: Skin is warm and dry. No rash noted or new ulcerations Psychiatric:  Has marked nervousmood and affect. Behavior is normal without agitation No other exam findings Lab Results  Component Value Date   WBC 5.5 03/05/2019   HGB 14.1 03/05/2019   HCT 42.5 03/05/2019   PLT 258.0 03/05/2019   GLUCOSE 96 03/05/2019   CHOL 153 03/05/2019   TRIG 102.0 03/05/2019   HDL 66.50 03/05/2019   LDLCALC 66 03/05/2019   ALT 12 03/05/2019   AST 14 03/05/2019   NA 138 03/05/2019   K 3.9 03/05/2019   CL 103 03/05/2019   CREATININE 0.77 03/05/2019   BUN 12 03/05/2019   CO2 26 03/05/2019   TSH 1.24 03/05/2019   INR 0.92 07/25/2017   HGBA1C 5.8 03/05/2019        Assessment & Plan:

## 2019-03-07 LAB — URINE CULTURE
MICRO NUMBER:: 908500
SPECIMEN QUALITY:: ADEQUATE

## 2019-03-11 ENCOUNTER — Ambulatory Visit (INDEPENDENT_AMBULATORY_CARE_PROVIDER_SITE_OTHER): Payer: Medicare Other | Admitting: Internal Medicine

## 2019-03-11 ENCOUNTER — Other Ambulatory Visit: Payer: Self-pay

## 2019-03-11 ENCOUNTER — Encounter: Payer: Self-pay | Admitting: Internal Medicine

## 2019-03-11 VITALS — BP 140/86 | HR 76 | Ht 63.5 in | Wt 164.8 lb

## 2019-03-11 DIAGNOSIS — I495 Sick sinus syndrome: Secondary | ICD-10-CM | POA: Diagnosis not present

## 2019-03-11 DIAGNOSIS — Z950 Presence of cardiac pacemaker: Secondary | ICD-10-CM

## 2019-03-11 NOTE — Patient Instructions (Signed)
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
## 2019-03-13 ENCOUNTER — Telehealth: Payer: Self-pay | Admitting: Internal Medicine

## 2019-03-13 NOTE — Telephone Encounter (Signed)
Copied from [place] [id number]. Topic: General - Other >> Mar 13, 2019 12:12 PM Keene Breath wrote: Reason for CRM: Patient called to get the results of her chest x-rays.  CB# [phone]

## 2019-03-13 NOTE — Telephone Encounter (Signed)
Pt has been informed.

## 2019-03-14 ENCOUNTER — Other Ambulatory Visit: Payer: Self-pay

## 2019-03-14 ENCOUNTER — Emergency Department (HOSPITAL_COMMUNITY)
Admission: EM | Admit: 2019-03-14 | Discharge: 2019-03-15 | Disposition: A | Discharge status: Home / Self Care | Payer: Medicare Other | Attending: Emergency Medicine | Admitting: Emergency Medicine

## 2019-03-14 ENCOUNTER — Emergency Department (HOSPITAL_COMMUNITY): Payer: Medicare Other

## 2019-03-14 ENCOUNTER — Encounter (HOSPITAL_COMMUNITY): Payer: Self-pay

## 2019-03-14 ENCOUNTER — Telehealth: Payer: Self-pay | Admitting: Internal Medicine

## 2019-03-14 DIAGNOSIS — R0602 Shortness of breath: Secondary | ICD-10-CM | POA: Insufficient documentation

## 2019-03-14 DIAGNOSIS — Z79899 Other long term (current) drug therapy: Secondary | ICD-10-CM | POA: Insufficient documentation

## 2019-03-14 DIAGNOSIS — Z950 Presence of cardiac pacemaker: Secondary | ICD-10-CM | POA: Insufficient documentation

## 2019-03-14 DIAGNOSIS — Z8541 Personal history of malignant neoplasm of cervix uteri: Secondary | ICD-10-CM | POA: Diagnosis not present

## 2019-03-14 DIAGNOSIS — E119 Type 2 diabetes mellitus without complications: Secondary | ICD-10-CM | POA: Insufficient documentation

## 2019-03-14 DIAGNOSIS — R5383 Other fatigue: Secondary | ICD-10-CM | POA: Insufficient documentation

## 2019-03-14 DIAGNOSIS — R110 Nausea: Secondary | ICD-10-CM | POA: Diagnosis not present

## 2019-03-14 DIAGNOSIS — R002 Palpitations: Secondary | ICD-10-CM

## 2019-03-14 DIAGNOSIS — R531 Weakness: Secondary | ICD-10-CM | POA: Insufficient documentation

## 2019-03-14 DIAGNOSIS — Z7982 Long term (current) use of aspirin: Secondary | ICD-10-CM | POA: Diagnosis not present

## 2019-03-14 LAB — BASIC METABOLIC PANEL
Anion gap: 12 (ref 5–15)
BUN: 17 mg/dL (ref 8–23)
CO2: 23 mmol/L (ref 22–32)
Calcium: 10.2 mg/dL (ref 8.9–10.3)
Chloride: 103 mmol/L (ref 98–111)
Creatinine, Ser: 1.19 mg/dL — ABNORMAL HIGH (ref 0.44–1.00)
GFR calc Af Amer: 52 mL/min — ABNORMAL LOW (ref >60)
GFR calc non Af Amer: 45 mL/min — ABNORMAL LOW (ref >60)
Glucose, Bld: 98 mg/dL (ref 70–99)
Potassium: 3.5 mmol/L (ref 3.5–5.1)
Sodium: 138 mmol/L (ref 135–145)

## 2019-03-14 LAB — URINALYSIS, ROUTINE W REFLEX MICROSCOPIC
Bilirubin Urine: NEGATIVE
Glucose, UA: NEGATIVE mg/dL
Hgb urine dipstick: NEGATIVE
Ketones, ur: NEGATIVE mg/dL
Nitrite: NEGATIVE
Protein, ur: NEGATIVE mg/dL
Specific Gravity, Urine: 1.006 (ref 1.005–1.030)
pH: 5 (ref 5.0–8.0)

## 2019-03-14 LAB — TROPONIN I (HIGH SENSITIVITY): Troponin I (High Sensitivity): 6 ng/L (ref <18)

## 2019-03-14 LAB — CBC
HCT: 43.6 % (ref 36.0–46.0)
Hemoglobin: 14.2 g/dL (ref 12.0–15.0)
MCH: 28 pg (ref 26.0–34.0)
MCHC: 32.6 g/dL (ref 30.0–36.0)
MCV: 86 fL (ref 80.0–100.0)
Platelets: 279 10*3/uL (ref 150–400)
RBC: 5.07 MIL/uL (ref 3.87–5.11)
RDW: 12.4 % (ref 11.5–15.5)
WBC: 9.9 10*3/uL (ref 4.0–10.5)
nRBC: 0 % (ref 0.0–0.2)

## 2019-03-14 LAB — CBG MONITORING, ED: Glucose-Capillary: 94 mg/dL (ref 70–99)

## 2019-03-14 MED ORDER — ONDANSETRON HCL 4 MG/2ML IJ SOLN
4.0000 mg | Freq: Once | INTRAMUSCULAR | Status: AC
  Administered 2019-03-14: 4 mg via INTRAVENOUS
  Filled 2019-03-14: qty 2

## 2019-03-14 MED ORDER — SODIUM CHLORIDE 0.9% FLUSH: 3.0000 mL | Freq: Once | INTRAVENOUS | Status: DC

## 2019-03-14 NOTE — Telephone Encounter (Signed)
Patient is concerned pacemaker changes are causing her to feel weak.Patient reports that she has been diaphoretic, cold and clammy,  had substernal chest pressure, extreme weakness and SOB x 2 days with any activity and periodically at rest. Recommended patient call 911 and be evaluated in [facility] ED due to reported symptoms. Patient does not want to be seen in ED and wants to have office visit to change ppm settings.   Spoke with Dr Lovena Le and gave him report on patient as documented above and he agreed with the above recommendation for patient to be evaluated in ED. Patient made aware that Dr Lovena Le recommends for patient to seek evaluation in ED today. Patient stated she has not decided if she will go to ED. I emphasized that patient should call 911 or have someone drive her to [facility] ED to be evaluated ASAP due to the reported symptems of chest pressure, extreme weakness, diaphoresis, and SOB with activity and at rest. Patient undecided on ED visit and will consider recommendation by Dr Lovena Le. ED precaution given .

## 2019-03-14 NOTE — ED Triage Notes (Signed)
Pt reports recent pacemaker setting changes and is now feeling SOB, nausea, and generalized weakness. Pt a.o, nad noted.

## 2019-03-14 NOTE — Telephone Encounter (Signed)
  Patient is calling because her pacemaker was adjusted at her visit on 03/11/19 with Dr Lovena Le. She states that she has less energy now and feels very fatigued and does not feel good at all.  She would like to speak with the nurse.

## 2019-03-14 NOTE — ED Provider Notes (Signed)
[place] EMERGENCY DEPARTMENT Provider Note   CSN: [id number] Arrival date & time: 03/14/19  1818     History   Chief Complaint Chief Complaint  Patient presents with  . Weakness  . Nausea  . Chest Pain    HPI Renee [name] is a 73 y.o. female.     73 year old female with prior medical history as detailed below presents for evaluation of fatigue.  Patient reports significant fatigue over the last 1 to 2 weeks.  Patient does report prior history of pacemaker with recent adjustment of her pacemaker settings by Dr. Lovena Le.  Her symptoms fatigue appear to predate the most recent pacemaker adjustment.  Her pacemaker rate was adjusted given reported palpitations.  She is presenting tonight complaining of diffuse overwhelming fatigue.  She denies fever.  She denies acute chest pain.  She denies shortness of breath.  The history is provided by the patient and medical records.  Weakness Severity:  Mild Onset quality:  Gradual Duration:  2 weeks Timing:  Constant Progression:  Waxing and waning Chronicity:  New Relieved by:  Nothing Worsened by:  Nothing Associated symptoms: no chest pain   Chest Pain Associated symptoms: weakness     Past Medical History:  Diagnosis Date  . Allergy    tape, electrodes  . Anxiety   . AV nodal re-entry tachycardia [facility])    a. s/p RFA 1993- Dr. Rolland Porter, [place]  . Broken neck ([place])    1993  . Cancer (HCC)    hx of cervical   ca  . Chronic chest pain    a. 2005 Cath: Mild nonobstructive plaque (30-40% LCX);  b. 08/2011 low risk myoview;  c. 09/2011 Coronary CT: NL cors w ca score of 0.  . Chronic pain syndrome   . Complication of anesthesia    blood pressure and heart dropped in 2009    . Concussion    1979, 1993  . Degenerative joint disease    a. s/p L Total Knee Arthroplasty.  . Diet-controlled type 2 diabetes mellitus ([place])    pt. denies  . Diverticulosis of colon   . Duodenitis without hemorrhage   .  Dysrhythmia    REPORTS , SINCE PLACEMENT OF PACEMAKER, STILL CAN FEEL HEART " GOING IN AND OUT OF [facility]" REPORTS, YESTERDAY HER BP SYSTOLIC WAS IN THE 761P AND WAS BREATHLESS, HR 112, SWEATING ; DENIES SYNCOPE  , DENIES CHEST PAIN   . Fibromyalgia   . GERD (gastroesophageal reflux disease)   . Hemorrhoids   . Hypertension   . Irritable bowel syndrome   . Long QT interval    a. mild - advised to avoid meds that may prolong QT.  Marland Kitchen Myocardial infarction [facility]) 09/1991   does not know when second heart attack was  . Neuropathy   . Osteoporosis   . Paroxysmal A-fib (HCC)    PER PATIENT. I CAN FEEL MY HEART RACING WHEN IM IN IT   . Recurrent upper respiratory infection (URI)   . SVT (supraventricular tachycardia) (HCC)    a. nonsustained SVT - previously offered flecainide but refused;  b. 12/2012 30 day event monitor w/o significant arrhythmias.  . Thyroid nodule     Patient Active Problem List   Diagnosis Date Noted  . Sinus node dysfunction ([place]) 03/11/2019  . Chronic cough 03/05/2019  . Urinary retention 03/05/2019  . Generalized weakness 09/11/2018  . Dysuria 05/09/2018  . Acute sinus infection 04/20/2018  . Other allergic rhinitis 04/20/2018  .  Cystocele, midline 08/01/2017  . Hyperglycemia 01/31/2017  . Bilateral foot pain 01/31/2017  . Laryngopharyngeal reflux (LPR) 08/18/2016  . Cough 06/24/2016  . Syncope 03/03/2016  . Pacemaker 03/03/2016  . Chest pain at rest 09/13/2015  . Spinal stenosis of lumbar region 09/07/2015  . Raynaud phenomenon 09/07/2015  . Insomnia 09/07/2015  . Encounter for well adult exam with abnormal findings 12/18/2014  . Paroxysmal spells 01/28/2014  . Dysthymia 01/28/2014  . Injury of leg 10/07/2013  . Headache(784.0) 03/14/2013  . Eye problem 02/15/2013  . Persistent headaches 02/15/2013  . Low blood sugar 05/31/2012  . Pre-syncope 10/04/2011  . NSVT (nonsustained ventricular tachycardia) ([place]) 10/04/2011  . Dyspnea 10/01/2009  .  Diverticulosis of large intestine 09/24/2009  . SWEATING 09/24/2009  . FATIQUE AND MALAISE 09/16/2009  . LONG QT SYNDROME 03/19/2009  . AV NODAL REENTRY TACHYCARDIA 10/03/2008  . Other dysphagia 06/17/2008  . HEMORRHOIDS 06/16/2008  . DUODENITIS, WITHOUT HEMORRHAGE 06/16/2008  . CONSTIPATION, CHRONIC 06/16/2008  . THYROID NODULE, right lobe. 05/26/2008  . Chronic pain syndrome 05/26/2008  . Coronary atherosclerosis 05/26/2008  . BRONCHITIS, RECURRENT 05/26/2008  . Irritable bowel syndrome 05/26/2008  . FECAL INCONTINENCE 05/26/2008  . Anxiety state 03/24/2008  . Essential hypertension 03/24/2008  . GERD 03/24/2008  . DEGENERATIVE JOINT DISEASE 03/24/2008  . BACK PAIN, LUMBAR 03/24/2008  . Fibromyalgia 03/24/2008  . Chest pain 03/24/2008  . NEUROPATHY, HX OF 03/24/2008    Past Surgical History:  Procedure Laterality Date  . ABLATION OF DYSRHYTHMIC FOCUS  1993  . ANTERIOR AND POSTERIOR REPAIR N/A 08/01/2017   Procedure: ANTERIOR (CYSTOCELE) AND POSTERIOR REPAIR (RECTOCELE);  Surgeon: Bjorn Loser, MD;  Location: [facility];  Service: Urology;  Laterality: N/A;  . APPENDECTOMY     1980  . BILATERAL SALPINGECTOMY Left 08/01/2017   Procedure: SALPINGECTOMY;  Surgeon: Bobbye Charleston, MD;  Location: [facility];  Service: Gynecology;  Laterality: Left;  . BIOPSY THYROID    . CARDIAC ELECTROPHYSIOLOGY STUDY AND ABLATION     atrioventricular nodal reentant tachycardia  . CHOLECYSTECTOMY  1980  . COLONOSCOPY  06/25/2008   Diverticulosis and Hemorrhoids  . COLPOSCOPY    . CORONARY ANGIOPLASTY  2007  . CYSTOSCOPY N/A 08/01/2017   Procedure: CYSTOSCOPY;  Surgeon: Bjorn Loser, MD;  Location: [facility];  Service: Urology;  Laterality: N/A;  . EP IMPLANTABLE DEVICE N/A 03/03/2016   Procedure: Pacemaker Implant;  Surgeon: Evans Lance, MD;  Location: [facility];  Service: Cardiovascular;  Laterality: N/A;  . ESOPHAGOGASTRODUODENOSCOPY    . HYSTEROSCOPY  2002   with resection of  endometrial polyps. by Dr.MCPhail  . HYSTEROSCOPY W/D&C N/A 02/05/2015   Procedure: DILATATION AND CURETTAGE /HYSTEROSCOPY;  Surgeon: Vanessa Kick, MD;  Location: [facility];  Service: Gynecology;  Laterality: N/A;  . LOOP RECORDER IMPLANT  08/21/2013   MDT LinQ implanted by Dr Lovena Le for syncope  . LOOP RECORDER IMPLANT N/A 08/21/2013   Procedure: LOOP RECORDER IMPLANT;  Surgeon: Evans Lance, MD;  Location[facility] CATH LAB;  Service: Cardiovascular;  Laterality: N/A;  . ORIF RADIAL HEAD / NECK FRACTURE  09/05/88  . TILT TABLE STUDY N/A 05/15/2013   Procedure: TILT TABLE STUDY;  Surgeon: Deboraha Sprang, MD;  Location: [facility] CATH LAB;  Service: Cardiovascular;  Laterality: N/A;  . TOTAL HIP ARTHROPLASTY  10/10   Left, at [place]    . VAGINAL HYSTERECTOMY N/A 08/01/2017   Procedure: HYSTERECTOMY VAGINAL;  Surgeon: Bobbye Charleston, MD;  Location:  [facility];  Service: Gynecology;  Laterality: N/A;     OB History    Gravida  2   Para  2   Term      Preterm      AB      Living  2     SAB      TAB      Ectopic      Multiple      Live Births               Home Medications    Prior to Admission medications   Medication Sig Start Date End Date Taking? Authorizing Provider  ACCU-CHEK GUIDE test strip USE UP TO 4 TIMES DAILY AS  DIRECTED 04/11/18  Yes Biagio Borg, MD  ALPRAZolam Duanne Moron) 0.5 MG tablet Take 0.5-1 tablets (0.25-0.5 mg total) by mouth 2 (two) times daily as needed for anxiety. 12/30/17  Yes Biagio Borg, MD  aspirin EC 81 MG tablet Take 81 mg by mouth daily.   Yes [provider]  blood glucose meter kit and supplies KIT Dispense per patient and insurance preferred. Use up to four times daily as directed. E11.9 02/21/18  Yes Biagio Borg, MD  cetirizine (ZYRTEC) 10 MG tablet Take 10 mg by mouth daily.   Yes [provider]  dicyclomine (BENTYL) 10 MG capsule Take 1 capsule (10 mg total) by mouth 3 (three) times  daily before meals. 01/02/19  Yes Ladene Artist, MD  diltiazem (CARDIZEM CD) 240 MG 24 hr capsule TAKE 1 CAPSULE BY MOUTH  EVERY MORNING 02/01/19  Yes Larey Dresser, MD  flecainide (TAMBOCOR) 100 MG tablet TAKE 1 TABLET BY MOUTH TWO  TIMES DAILY Patient taking differently: Take 100 mg by mouth 2 (two) times daily.  10/01/18  Yes Larey Dresser, MD  naproxen sodium (ALEVE) 220 MG tablet Take 220 mg by mouth as needed.   Yes [provider]  nitroGLYCERIN (NITROSTAT) 0.4 MG SL tablet Place 1 tablet (0.4 mg total) under the tongue every 5 (five) minutes as needed for chest pain. 02/03/15  Yes Biagio Borg, MD  omeprazole (PRILOSEC) 40 MG capsule TAKE 1 CAPSULE BY MOUTH  EVERY EVENING Patient taking differently: Take 40 mg by mouth every evening.  02/01/19  Yes Biagio Borg, MD  sodium chloride (OCEAN) 0.65 % SOLN nasal spray Place 1-2 sprays into both nostrils 4 (four) times daily as needed for congestion.   Yes [provider]  triamcinolone (NASACORT) 55 MCG/ACT AERO nasal inhaler Place 2 sprays into the nose daily. 10/22/18  Yes Biagio Borg, MD  triamterene-hydrochlorothiazide (DYAZIDE) 37.5-25 MG capsule TAKE 1 CAPSULE BY MOUTH  DAILY Patient taking differently: Take 1 capsule by mouth daily.  10/01/18  Yes Larey Dresser, MD  zolpidem (AMBIEN) 5 MG tablet Take 1 tablet (5 mg total) by mouth at bedtime as needed for sleep. Patient taking differently: Take 2.5 mg by mouth at bedtime as needed for sleep.  02/01/19  Yes Biagio Borg, MD  ACCU-CHEK FASTCLIX LANCETS MISC USE UP TO 4 TIMES DAILY AS  DIRECTED 04/11/18   Biagio Borg, MD    Family History Family History  Problem Relation Age of Onset  . Ovarian cancer Mother   . Stroke Mother   . Diabetes Mother   . Asthma Mother   . Lung cancer Father        Heart problems  . Heart disease Sister   . Diabetes  Brother   . Diabetes Sister        Pacemaker, CHF  . Heart disease Sister   . Asthma Sister     Social  History Social History   Tobacco Use  . Smoking status: Never Smoker  . Smokeless tobacco: Never Used  Substance Use Topics  . Alcohol use: Yes    Comment: Occasional   . Drug use: No     Allergies   Cyclobenzaprine, Azithromycin, Nitrofurantoin, Adhesive [tape], Moxifloxacin, and Quinolones   Review of Systems Review of Systems  Cardiovascular: Negative for chest pain.  Neurological: Positive for weakness.  All other systems reviewed and are negative.    Physical Exam Updated Vital Signs BP 137/79   Pulse 63   Temp 98.2 F (36.8 C) (Oral)   Resp 18   SpO2 100%   Physical Exam Vitals signs and nursing note reviewed.  Constitutional:      General: She is not in acute distress.    Appearance: She is well-developed.  HENT:     Head: Normocephalic and atraumatic.  Eyes:     Conjunctiva/sclera: Conjunctivae normal.     Pupils: Pupils are equal, round, and reactive to light.  Neck:     Musculoskeletal: Normal range of motion and neck supple.  Cardiovascular:     Rate and Rhythm: Normal rate and regular rhythm.     Heart sounds: Normal heart sounds.  Pulmonary:     Effort: Pulmonary effort is normal. No respiratory distress.     Breath sounds: Normal breath sounds.  Abdominal:     General: There is no distension.     Palpations: Abdomen is soft.     Tenderness: There is no abdominal tenderness.  Musculoskeletal: Normal range of motion.        General: No deformity.  Skin:    General: Skin is warm and dry.  Neurological:     General: No focal deficit present.     Mental Status: She is alert and oriented to person, place, and time.     Cranial Nerves: No cranial nerve deficit.     Motor: No weakness.      ED Treatments / Results  Labs (all labs ordered are listed, but only abnormal results are displayed) Labs Reviewed  BASIC METABOLIC PANEL - Abnormal; Notable for the following components:      Result Value   Creatinine, Ser 1.19 (*)    GFR calc non  Af Amer 45 (*)    GFR calc Af Amer 52 (*)    All other components within normal limits  URINALYSIS, ROUTINE W REFLEX MICROSCOPIC - Abnormal; Notable for the following components:   Leukocytes,Ua MODERATE (*)    Bacteria, UA RARE (*)    All other components within normal limits  CBC  CBG MONITORING, ED  TROPONIN I (HIGH SENSITIVITY)  TROPONIN I (HIGH SENSITIVITY)    EKG EKG Interpretation  Date/Time:  Thursday March 14 2019 18:37:53 EDT Ventricular Rate:  66 PR Interval:  172 QRS Duration: 96 QT Interval:  456 QTC Calculation: 478 R Axis:   59 Text Interpretation:  Electronic atrial pacemaker Low voltage QRS Possible Inferior infarct , age undetermined ST & T wave abnormality, consider anterolateral ischemia Abnormal ECG Confirmed by Dene Gentry [id number]) on 03/14/2019 7:36:11 PM   Radiology Dg Chest Port 1 View  Result Date: 03/14/2019 CLINICAL DATA:  Shortness of breath with recent pacemaker sending changes. EXAM: PORTABLE CHEST 1 VIEW COMPARISON:  March 05, 2019 FINDINGS: The mediastinal  contour and cardiac silhouette are stable. Cardiac pacemaker is unchanged. There is no focal infiltrate, pulmonary edema, or pleural effusion. The bony structures are stable. IMPRESSION: No active disease. Electronically Signed   By: Abelardo Diesel M.D.   On: 03/14/2019 21:09    Procedures Procedures (including critical care time)  Medications Ordered in ED Medications  sodium chloride flush (NS) 0.9 % injection 3 mL (0 mLs Intravenous Hold 03/14/19 1955)  ondansetron (ZOFRAN) injection 4 mg (4 mg Intravenous Given 03/14/19 2028)     Initial Impression / Assessment and Plan / ED Course  I have reviewed the triage vital signs and the nursing notes.  Pertinent labs & imaging results that were available during my care of the patient were reviewed by me and considered in my medical decision making (see chart for details).        MDM  Screen complete  NASTASHA REISING was  evaluated in Emergency Department on 03/14/2019 for the symptoms described in the history of present illness. She was evaluated in the context of the global COVID-19 pandemic, which necessitated consideration that the patient might be at risk for infection with the SARS-CoV-2 virus that causes COVID-19. Institutional protocols and algorithms that pertain to the evaluation of patients at risk for COVID-19 are in a state of rapid change based on information released by regulatory bodies including the CDC and federal and state organizations. These policies and algorithms were followed during the patient's care in the ED.   Patient is presenting for evaluation of reported generalized fatigue.  Patient without focal finding on exam.  Patient's reported symptoms are not likely to be related to her recent pacemaker setting adjustment.  Screening labs do not reveal significant abnormality thus far.  Initially high sensitive troponin is 6. Delta Troponin is pending.   UA demonstrated moderate leukocytes and rare bacteria without nitrites or elevated WBC count.  Patient is without urinary symptoms.  Dr. Kathrynn Humble aware of pending delta troponin and disposition.     Final Clinical Impressions(s) / ED Diagnoses   Final diagnoses:  Other fatigue    ED Discharge Orders    None       Valarie Merino, MD 03/14/19 2309

## 2019-03-14 NOTE — Telephone Encounter (Signed)
noted 

## 2019-03-15 ENCOUNTER — Ambulatory Visit: Payer: Medicare Other

## 2019-03-15 LAB — TROPONIN I (HIGH SENSITIVITY): Troponin I (High Sensitivity): 4 ng/L (ref <18)

## 2019-03-15 NOTE — Discharge Instructions (Addendum)
  All the results in the ER are normal, labs and imaging. We are not sure what is causing your symptoms. The workup in the ER is not complete, and is limited to screening for life threatening and emergent conditions only, so please see a primary care doctor for further evaluation.  Please return to the ER if your symptoms worsen; you have increased pain, fevers, chills, inability to keep any medications down, confusion. Otherwise see the outpatient doctor as requested.  

## 2019-03-15 NOTE — Telephone Encounter (Signed)
Call returned to Pt.  Pt thinks she has PVC's  No amount of data will convince her.  She states ER told her she had PVC's but nothing is documented.  Will speak with GT on Monday.  Highly recommend a heart monitor so she can "mark" her "irregular heartbeats"  Will follow

## 2019-03-15 NOTE — ED Notes (Signed)
Patient Alert and oriented to baseline. Stable and ambulatory to baseline. Patient verbalized understanding of the discharge instructions.  Patient belongings were taken by the patient.   

## 2019-03-15 NOTE — ED Provider Notes (Signed)
Patient's delta trop is neg. Dr. Francia Greaves had seen the patient and completed eval. He recommended close pcp f/u if trop is neg.  The patient appears reasonably screened and/or stabilized for discharge and I doubt any other medical condition or other [facility] requiring further screening, evaluation, or treatment in the ED at this time prior to discharge.   Results from the ER workup discussed with the patient face to face and all questions answered to the best of my ability. The patient is safe for discharge with strict return precautions.    Varney Biles, MD 03/15/19 [phone]

## 2019-03-15 NOTE — Telephone Encounter (Signed)
° ° °  Patient requesting to speak with nurse with follow up question from ED. Please call

## 2019-03-15 NOTE — Telephone Encounter (Signed)
Pt evaluated in ER.  Was advised current symptoms not related to pacemaker, and symptoms predated her pacemaker adjustment.  No further action needed at this time.

## 2019-03-18 LAB — URINE CULTURE

## 2019-03-18 NOTE — Telephone Encounter (Signed)
Left detailed message per DPR.  Discussed with GT.  Per GT -agreed to order HM to evaluate Pt reports of palpitations that leave her so weak for days that she cannot leave her bed.  Advised Pt HM had been ordered and would be mailed to her.  Pt previously agreeable to HM when discussed last Friday 10/2

## 2019-03-18 NOTE — Addendum Note (Signed)
Addended by: Willeen Cass A on: 03/18/2019 02:27 PM   Modules accepted: Orders

## 2019-03-22 ENCOUNTER — Other Ambulatory Visit: Payer: Self-pay | Admitting: Internal Medicine

## 2019-03-22 ENCOUNTER — Other Ambulatory Visit (HOSPITAL_COMMUNITY): Payer: Self-pay | Admitting: Cardiology

## 2019-03-22 DIAGNOSIS — Z950 Presence of cardiac pacemaker: Secondary | ICD-10-CM

## 2019-03-27 ENCOUNTER — Telehealth: Payer: Self-pay | Admitting: *Deleted

## 2019-03-27 NOTE — Telephone Encounter (Signed)
[employer] to ship a 30 day cardiac event to the patients home. Instructions reviewed briefly as they are included in the monitor kit.

## 2019-03-29 ENCOUNTER — Ambulatory Visit (INDEPENDENT_AMBULATORY_CARE_PROVIDER_SITE_OTHER): Payer: Medicare Other

## 2019-03-29 DIAGNOSIS — R002 Palpitations: Secondary | ICD-10-CM

## 2019-04-03 ENCOUNTER — Telehealth: Payer: Self-pay

## 2019-04-03 NOTE — Telephone Encounter (Signed)
Pt has questions about a medication that effects long QT's. Pt would like a phone call today in whether she should take the medication or not. The best number for the pt is [phone].

## 2019-04-03 NOTE — Telephone Encounter (Signed)
Returned call to Pt.  Call went directly to VM.  Advised Pt would send her a mychart message to respond to with the medications she is concerned about.  Will then submit to pharmacy for review.

## 2019-04-08 ENCOUNTER — Telehealth: Payer: Self-pay | Admitting: *Deleted

## 2019-04-08 NOTE — Telephone Encounter (Signed)
Spoke with pt re Serious event per [employer]  Accelerated Idioventricular Rhythm Sinus Tachycardia w/Artifact.Per pt with little activity HR increases to 100-130 and notes SOB for aprrox 3-4 months Per Dr Meda Coffee no changes continue to monitor ./cy

## 2019-04-09 ENCOUNTER — Telehealth: Payer: Self-pay

## 2019-04-09 NOTE — Telephone Encounter (Signed)
Pt is returning [employer] phone call. Please call her as soon as possible. She has questions about the event monitor.

## 2019-04-09 NOTE — Telephone Encounter (Signed)
Patient expressed concern regarding "critical" notification for her cardiac event monitor which occurred 04/07/2019.  Patient states Altha Harm was going to review with Dr. Lovena Le and get back with her.  Patient also states she has a history of PVC's, so she feels uncomfortable waiting for a response.  04/07/19 Serious episode and similar episode on 03/30/19 have been imported.  I will route to Dr. Lovena Le.  Patient asks that Altha Harm give her a call back.

## 2019-04-09 NOTE — Telephone Encounter (Signed)
  Patient is calling back to follow up with Altha Harm regarding her heart monitor

## 2019-04-10 NOTE — Telephone Encounter (Signed)
Her monitor is not in my inbox. The strips represent PMT. She will need her device reprogrammed. GT

## 2019-04-11 NOTE — Telephone Encounter (Signed)
Scheduler aware and will schedule patient for next available f/u with Dr. Lovena Le.

## 2019-04-16 ENCOUNTER — Ambulatory Visit: Payer: Medicare Other | Admitting: Internal Medicine

## 2019-04-16 ENCOUNTER — Other Ambulatory Visit: Payer: Self-pay

## 2019-04-16 ENCOUNTER — Encounter: Payer: Self-pay | Admitting: Internal Medicine

## 2019-04-16 ENCOUNTER — Telehealth: Payer: Self-pay

## 2019-04-16 VITALS — BP 142/80 | HR 73 | Ht 63.5 in | Wt 167.0 lb

## 2019-04-16 DIAGNOSIS — Z950 Presence of cardiac pacemaker: Secondary | ICD-10-CM | POA: Diagnosis not present

## 2019-04-16 DIAGNOSIS — I495 Sick sinus syndrome: Secondary | ICD-10-CM | POA: Diagnosis not present

## 2019-04-16 MED ORDER — FLECAINIDE ACETATE 50 MG PO TABS: 50.0000 mg | ORAL_TABLET | Freq: Two times a day (BID) | ORAL | 3 refills | Status: DC
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
## 2019-04-16 NOTE — Patient Instructions (Addendum)
Medication Instructions:  Your physician has recommended you make the following change in your medication:   1.  Reduce your flecainide 100 mg---Take 1/2 tablet (50 mg) by mouth twice a day  Labwork: None ordered.  Testing/Procedures: None ordered.  Follow-Up: Your physician wants you to follow-up in: 6 weeks with Dr. Lovena Le.     May 29, 2019 at 4:15 PM  Remote monitoring is used to monitor your Pacemaker from home. This monitoring reduces the number of office visits required to check your device to one time per year. It allows [place] to keep an eye on the functioning of your device to ensure it is working properly. You are scheduled for a device check from home on 05/13/2019. You may send your transmission at any time that day. If you have a wireless device, the transmission will be sent automatically. After your physician reviews your transmission, you will receive a postcard with your next transmission date.  Any Other Special Instructions Will Be Listed Below (If Applicable).  If you need a refill on your cardiac medications before your next appointment, please call your pharmacy.

## 2019-04-16 NOTE — Telephone Encounter (Signed)
I received a monitor event on the patient recorded on 11/3 @ 8 pm.  She was sitting in a chair and felt like "her breath was taken away" so she activated the monitor.  The report indicated "Accelerated Idioventricular Rhythm".  The patient has an appointment with Dr Lovena Le this afternoon.  I will give them printout.

## 2019-04-25 ENCOUNTER — Telehealth: Payer: Self-pay | Admitting: Internal Medicine

## 2019-04-25 NOTE — Telephone Encounter (Signed)
Patient states that Dr. Lovena Le ia aware of her situation with her heart monitor. She states she is still having  "spells" that seem to "take her under". She has reported she feels weak in her legs and knees. She has experienced SOB but she is having a good day today. She had two days where she felt like she was "taken under completely" and that was yesterday and last Friday 04/19/19. She would like Dr. Lovena Le or his nurse to please give her call in regards to this.

## 2019-04-25 NOTE — Telephone Encounter (Signed)
I spoke to the patient who called to let us know that she is continuing to have "spells" of passing out and "going under".  She had OV on 11/3 with Dr Lovena Le. Last Friday, 11/6 she had an episode from 5-9 pm where she just goes to bed and "passes" out.  She had another similar episode on 11/11 at 3:30pm and had to lay down.  She "feels PVCs".    She says that she pushes the button to her monitor, but we have not received any notifications.  She has a PPM and I asked her to send in a transmission, but she sounded unfamiliar.  Please advise, thank you.

## 2019-04-25 NOTE — Telephone Encounter (Signed)
Normal device function, no episodes.   Chanetta Marshall, NP 04/25/2019 11:47 AM

## 2019-04-28 NOTE — Telephone Encounter (Signed)
We will need to see her back in the office for more evaluation.

## 2019-04-30 ENCOUNTER — Other Ambulatory Visit: Payer: Self-pay

## 2019-04-30 ENCOUNTER — Ambulatory Visit
Admission: RE | Admit: 2019-04-30 | Discharge: 2019-04-30 | Disposition: A | Discharge status: Home / Self Care | Payer: Medicare Other | Source: Ambulatory Visit | Attending: Internal Medicine | Admitting: Internal Medicine

## 2019-04-30 DIAGNOSIS — Z1231 Encounter for screening mammogram for malignant neoplasm of breast: Secondary | ICD-10-CM | POA: Diagnosis not present

## 2019-05-01 ENCOUNTER — Encounter: Payer: Medicare Other | Admitting: Internal Medicine

## 2019-05-07 ENCOUNTER — Telehealth: Payer: Self-pay | Admitting: Internal Medicine

## 2019-05-07 ENCOUNTER — Other Ambulatory Visit: Payer: Self-pay

## 2019-05-07 ENCOUNTER — Encounter: Payer: Self-pay | Admitting: Internal Medicine

## 2019-05-07 ENCOUNTER — Ambulatory Visit: Payer: Medicare Other | Admitting: Internal Medicine

## 2019-05-07 VITALS — BP 144/84 | HR 76 | Ht 63.5 in | Wt 165.0 lb

## 2019-05-07 DIAGNOSIS — I495 Sick sinus syndrome: Secondary | ICD-10-CM

## 2019-05-07 NOTE — Telephone Encounter (Signed)
New Message:      Pt called and said daughter would be coming in with her today for her appt with Dr Lovena Le. I told her it need to be medically necessary for someone to come in with patients. She said she was coming in and her daughter was coming in with her.

## 2019-05-07 NOTE — Telephone Encounter (Signed)
Returned call to pt.  Advised no family members allowed up with Patient's unless they have a physical or mental deficit.  Pt is upset.  Apologized.  Advised this was not negotiable.

## 2019-05-07 NOTE — Patient Instructions (Addendum)
Medication Instructions:  Your physician recommends that you continue on your current medications as directed. Please refer to the Current Medication list given to you today.  Labwork: None ordered.  Testing/Procedures: None ordered.  Follow-Up: Your physician wants you to follow-up in: 3 months with Dr. Lovena Le.     August 05, 2019 at 2:00 PM  Remote monitoring is used to monitor your Pacemaker from home. This monitoring reduces the number of office visits required to check your device to one time per year. It allows [place] to keep an eye on the functioning of your device to ensure it is working properly. You are scheduled for a device check from home on 05/13/2019. You may send your transmission at any time that day. If you have a wireless device, the transmission will be sent automatically. After your physician reviews your transmission, you will receive a postcard with your next transmission date.  Any Other Special Instructions Will Be Listed Below (If Applicable).  If you need a refill on your cardiac medications before your next appointment, please call your pharmacy.
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
## 2019-05-13 ENCOUNTER — Ambulatory Visit (INDEPENDENT_AMBULATORY_CARE_PROVIDER_SITE_OTHER): Payer: Medicare Other | Admitting: *Deleted

## 2019-05-13 DIAGNOSIS — I495 Sick sinus syndrome: Secondary | ICD-10-CM

## 2019-05-13 LAB — CUP PACEART REMOTE DEVICE CHECK
Date Time Interrogation Session: 20201130085952
Implantable Lead Implant Date: 20170921
Implantable Lead Implant Date: 20170921
Implantable Lead Location: 753859
Implantable Lead Location: 753860
Implantable Lead Model: 377
Implantable Lead Model: 377
Implantable Lead Serial Number: 49575519
Implantable Lead Serial Number: 49580836
Implantable Pulse Generator Implant Date: 20170921
Pulse Gen Model: 407145
Pulse Gen Serial Number: 68850891

## 2019-05-13 NOTE — Addendum Note (Signed)
Addended by: Rose Phi on: 05/13/2019 04:37 PM   Modules accepted: Orders

## 2019-05-29 ENCOUNTER — Encounter: Payer: Medicare Other | Admitting: Internal Medicine

## 2019-06-05 NOTE — Progress Notes (Signed)
PPM remote 

## 2019-06-20 LAB — CUP PACEART INCLINIC DEVICE CHECK
Date Time Interrogation Session: 20201124102038
Implantable Lead Implant Date: 20170921
Implantable Lead Implant Date: 20170921
Implantable Lead Location: 753859
Implantable Lead Location: 753860
Implantable Lead Model: 377
Implantable Lead Model: 377
Implantable Lead Serial Number: 49575519
Implantable Lead Serial Number: 49580836
Implantable Pulse Generator Implant Date: 20170921
Pulse Gen Model: 407145
Pulse Gen Serial Number: 68850891

## 2019-07-05 DIAGNOSIS — Z20822 Contact with and (suspected) exposure to covid-19: Secondary | ICD-10-CM | POA: Diagnosis not present

## 2019-07-09 DIAGNOSIS — Z950 Presence of cardiac pacemaker: Secondary | ICD-10-CM | POA: Diagnosis not present

## 2019-07-09 DIAGNOSIS — Z7982 Long term (current) use of aspirin: Secondary | ICD-10-CM | POA: Diagnosis not present

## 2019-07-09 DIAGNOSIS — E042 Nontoxic multinodular goiter: Secondary | ICD-10-CM | POA: Diagnosis not present

## 2019-07-09 DIAGNOSIS — Z9049 Acquired absence of other specified parts of digestive tract: Secondary | ICD-10-CM | POA: Diagnosis not present

## 2019-07-09 DIAGNOSIS — R0989 Other specified symptoms and signs involving the circulatory and respiratory systems: Secondary | ICD-10-CM | POA: Diagnosis not present

## 2019-07-09 DIAGNOSIS — K219 Gastro-esophageal reflux disease without esophagitis: Secondary | ICD-10-CM | POA: Diagnosis not present

## 2019-07-09 DIAGNOSIS — I119 Hypertensive heart disease without heart failure: Secondary | ICD-10-CM | POA: Diagnosis not present

## 2019-07-09 DIAGNOSIS — I2510 Atherosclerotic heart disease of native coronary artery without angina pectoris: Secondary | ICD-10-CM | POA: Diagnosis not present

## 2019-07-10 ENCOUNTER — Other Ambulatory Visit: Payer: Self-pay | Admitting: Internal Medicine

## 2019-07-15 DIAGNOSIS — E042 Nontoxic multinodular goiter: Secondary | ICD-10-CM | POA: Diagnosis not present

## 2019-07-15 DIAGNOSIS — R490 Dysphonia: Secondary | ICD-10-CM | POA: Diagnosis not present

## 2019-07-16 DIAGNOSIS — Z8639 Personal history of other endocrine, nutritional and metabolic disease: Secondary | ICD-10-CM | POA: Diagnosis not present

## 2019-07-16 DIAGNOSIS — K219 Gastro-esophageal reflux disease without esophagitis: Secondary | ICD-10-CM | POA: Diagnosis not present

## 2019-07-16 DIAGNOSIS — R490 Dysphonia: Secondary | ICD-10-CM | POA: Insufficient documentation

## 2019-08-01 ENCOUNTER — Other Ambulatory Visit: Payer: Self-pay | Admitting: Internal Medicine

## 2019-08-02 ENCOUNTER — Telehealth: Payer: Self-pay

## 2019-08-02 MED ORDER — POTASSIUM CHLORIDE ER 10 MEQ PO TBCR: 10.0000 meq | EXTENDED_RELEASE_TABLET | Freq: Every day | ORAL | 3 refills | Status: DC

## 2019-08-02 NOTE — Telephone Encounter (Signed)
Pt called stating the pharmacy states the office has denied refill on her potassium. She wants to know why she was denied and wants a phone call as soon as possible. Her phone number is [phone].

## 2019-08-02 NOTE — Telephone Encounter (Signed)
Pt saw Dr. Lovena Le September 2020 and was on potassium.  It appears that when Pt went to ER on 03/14/2019 the potassium fell off her med list.  Advised Pt that this nurse would add potassium back on her med list for her.    Sent to [facility] per Pt request.

## 2019-08-05 ENCOUNTER — Encounter: Payer: Medicare Other | Admitting: Internal Medicine

## 2019-08-05 ENCOUNTER — Other Ambulatory Visit: Payer: Self-pay

## 2019-08-05 MED ORDER — POTASSIUM CHLORIDE ER 10 MEQ PO TBCR: 10.0000 meq | EXTENDED_RELEASE_TABLET | Freq: Every day | ORAL | 3 refills | Status: DC

## 2019-08-06 ENCOUNTER — Encounter: Payer: Self-pay | Admitting: Internal Medicine

## 2019-08-06 ENCOUNTER — Other Ambulatory Visit: Payer: Self-pay

## 2019-08-06 ENCOUNTER — Ambulatory Visit: Payer: Medicare Other | Admitting: Internal Medicine

## 2019-08-06 VITALS — BP 122/74 | HR 68 | Ht 63.0 in | Wt 162.0 lb

## 2019-08-06 DIAGNOSIS — I495 Sick sinus syndrome: Secondary | ICD-10-CM | POA: Diagnosis not present

## 2019-08-06 DIAGNOSIS — I10 Essential (primary) hypertension: Secondary | ICD-10-CM

## 2019-08-06 DIAGNOSIS — Z950 Presence of cardiac pacemaker: Secondary | ICD-10-CM

## 2019-08-06 DIAGNOSIS — I493 Ventricular premature depolarization: Secondary | ICD-10-CM | POA: Insufficient documentation

## 2019-08-06 NOTE — Patient Instructions (Addendum)
Medication Instructions:  Your physician recommends that you continue on your current medications as directed. Please refer to the Current Medication list given to you today.  Labwork: None ordered.  Testing/Procedures: None ordered.  Follow-Up: Your physician wants you to follow-up in: 6 months with Dr. Lovena Le.   You will receive a reminder letter in the mail two months in advance. If you don't receive a letter, please call our office to schedule the follow-up appointment.  Remote monitoring is used to monitor your Pacemaker from home. This monitoring reduces the number of office visits required to check your device to one time per year. It allows [place] to keep an eye on the functioning of your device to ensure it is working properly. You are scheduled for a device check from home on 08/12/2019. You may send your transmission at any time that day. If you have a wireless device, the transmission will be sent automatically. After your physician reviews your transmission, you will receive a postcard with your next transmission date.  Any Other Special Instructions Will Be Listed Below (If Applicable).  If you need a refill on your cardiac medications before your next appointment, please call your pharmacy.
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
## 2019-08-08 ENCOUNTER — Telehealth: Payer: Self-pay

## 2019-08-08 NOTE — Telephone Encounter (Signed)
The pt states she saw Dr. Lovena Le on 08-06-2019 and her job needs a note stating she was here. I told her I will let Dr. Lovena Le nurse know. She states the note can be sent to her e-mail: [email or web address].

## 2019-08-09 NOTE — Telephone Encounter (Signed)
Work note faxed as requested.  

## 2019-08-12 ENCOUNTER — Ambulatory Visit (INDEPENDENT_AMBULATORY_CARE_PROVIDER_SITE_OTHER): Payer: Medicare Other | Admitting: *Deleted

## 2019-08-12 DIAGNOSIS — I495 Sick sinus syndrome: Secondary | ICD-10-CM

## 2019-08-12 LAB — CUP PACEART REMOTE DEVICE CHECK
Date Time Interrogation Session: 20210301071355
Implantable Lead Implant Date: 20170921
Implantable Lead Implant Date: 20170921
Implantable Lead Location: 753859
Implantable Lead Location: 753860
Implantable Lead Model: 377
Implantable Lead Model: 377
Implantable Lead Serial Number: 49575519
Implantable Lead Serial Number: 49580836
Implantable Pulse Generator Implant Date: 20170921
Pulse Gen Model: 407145
Pulse Gen Serial Number: 68850891

## 2019-08-12 NOTE — Progress Notes (Signed)
PPM Remote  

## 2019-08-15 LAB — CUP PACEART INCLINIC DEVICE CHECK
Date Time Interrogation Session: 20210223165253
Implantable Lead Implant Date: 20170921
Implantable Lead Implant Date: 20170921
Implantable Lead Location: 753859
Implantable Lead Location: 753860
Implantable Lead Model: 377
Implantable Lead Model: 377
Implantable Lead Serial Number: 49575519
Implantable Lead Serial Number: 49580836
Implantable Pulse Generator Implant Date: 20170921
Pulse Gen Model: 407145
Pulse Gen Serial Number: 68850891

## 2019-08-21 ENCOUNTER — Telehealth: Payer: Self-pay | Admitting: Gastroenterology

## 2019-08-21 NOTE — Telephone Encounter (Signed)
Pt states about a month ago she lost her voice. She was seen by endocrinologist and has hx of thyroid nodules. States he told her everything from his standpoint was ok. Pt then saw an ENT and he did a scope and looked at her "voice box" and said everything was ok and suggested she see her GI doctor. Dr. Wilburn Cornelia discussed with her that gerd sometimes causes issues like this. Pt reports she has not had an EGD in quite a while and she has been on omeprazole 40mg  daily for a long time. Pt scheduled to see Dr. Fuller Plan 09/25/19 @8 :30am. Pt aware of appt.

## 2019-08-21 NOTE — Telephone Encounter (Signed)
Patient called stating she has concerns and information in reference to her upper GI hx and would like to discuss

## 2019-09-05 DIAGNOSIS — I10 Essential (primary) hypertension: Secondary | ICD-10-CM

## 2019-09-05 NOTE — Telephone Encounter (Signed)
Nightly transmission reviewed from 09/05/19. No atrial arrhythmias noted since last in-office check on 08/07/19. Programmed AAIR, AP 43%. Available lead trends stable.   Routed to [place], Therapist, sports, for further management.

## 2019-09-11 MED ORDER — DILTIAZEM HCL 30 MG PO TABS: 60.0000 mg | ORAL_TABLET | Freq: Four times a day (QID) | ORAL | 3 refills | Status: DC | PRN

## 2019-09-13 ENCOUNTER — Telehealth: Payer: Self-pay | Admitting: Internal Medicine

## 2019-09-13 MED ORDER — FUROSEMIDE 20 MG PO TABS: ORAL_TABLET | ORAL | 3 refills | Status: DC

## 2019-09-13 NOTE — Addendum Note (Signed)
Addended by: Willeen Cass A on: 09/13/2019 11:03 AM   Modules accepted: Orders

## 2019-09-13 NOTE — Addendum Note (Signed)
Addended by: Willeen Cass A on: 09/13/2019 10:40 AM   Modules accepted: Orders

## 2019-09-13 NOTE — Telephone Encounter (Signed)
Called pharmacy to clarify how pt is taking furosemide that was sent in today. Stated that pt is supposed to take furosemide 20 mg tablets, taking 2 tablet by mouth daily for 3 days and then reduce to taking 1 tablet by mouth daily. Pharmacist verbalized understanding.

## 2019-09-13 NOTE — Telephone Encounter (Signed)
Follow Up:      Needs clarification on pt's Lasix.

## 2019-09-20 ENCOUNTER — Other Ambulatory Visit: Payer: Medicare Other

## 2019-09-25 ENCOUNTER — Encounter: Payer: Self-pay | Admitting: Gastroenterology

## 2019-09-25 ENCOUNTER — Ambulatory Visit: Payer: Medicare Other | Admitting: Gastroenterology

## 2019-09-25 VITALS — BP 130/80 | HR 72 | Temp 98.5°F | Ht 63.0 in | Wt 168.2 lb

## 2019-09-25 DIAGNOSIS — K219 Gastro-esophageal reflux disease without esophagitis: Secondary | ICD-10-CM

## 2019-09-25 MED ORDER — ESOMEPRAZOLE MAGNESIUM 40 MG PO CPDR: 40.0000 mg | DELAYED_RELEASE_CAPSULE | Freq: Two times a day (BID) | ORAL | 11 refills | Status: DC

## 2019-09-25 NOTE — Patient Instructions (Addendum)
Stop taking omeprazole.   We have sent the following medications to your pharmacy for you to pick up at your convenience: Nexium 40 mg twice daily.   Patient advised to avoid spicy, acidic, citrus, chocolate, mints, fruit and fruit juices.  Limit the intake of caffeine, alcohol and Soda.  Don't exercise too soon after eating.  Don't lie down within 3-4 hours of eating.  Elevate the head of your bed.  You can take over the counter hydrocortisone cream rectally three times a day.   RECTAL CARE INSTRUCTIONS:  1. Sitz Baths twice a day for 10 minutes each. 2. Thoroughly clean and dry the rectum. 3. Put Tucks pad against the rectum at night. 4. Clean the rectum with Balenol lotion after each bowel movement.   Normal BMI (Body Mass Index- based on height and weight) is between 23 and 30. Your BMI today is Body mass index is 29.8 kg/m. Marland Kitchen Please consider follow up  regarding your BMI with your Primary Care Provider.   Thank you for choosing me and [facility].  Pricilla Riffle. Dagoberto Ligas., MD., Marval Regal

## 2019-09-25 NOTE — Progress Notes (Signed)
    History of Present Illness: This is a 74 year old female complaining of difficulty breathing at night and difficulty speaking after eating.  These episodes appear to occur after active heartburn and regurgitation.  She also notes prolonged periods of hoarseness.  She relates occasional episodes of of choking that are not related to swallowing foods or liquids.  She was evaluated by Dr. Wilburn Cornelia in February who felt symptoms were related to LPR.  She has a long history GERD with LPR of IBS alternating pattern.  She has intermittent difficulties with hemorrhoids.  See July 2020 office note.  Current Medications, Allergies, Past Medical History, Past Surgical History, Family History and Social History were reviewed in [employer] record.   Physical Exam: General: Well developed, well nourished, no acute distress Head: Normocephalic and atraumatic Eyes:  sclerae anicteric, EOMI Ears: Normal auditory acuity Mouth: Not examined, mask on during Covid-19 pandemic Lungs: Clear throughout to auscultation Heart: Regular rate and rhythm; no murmurs, rubs or bruits Abdomen: Soft, non tender and non distended. No masses, hepatosplenomegaly or hernias noted. Normal Bowel sounds Rectal: Not done Musculoskeletal: Symmetrical with no gross deformities  Pulses:  Normal pulses noted Extremities: No clubbing, cyanosis, edema or deformities noted Neurological: Alert oriented x 4, grossly nonfocal Psychological:  Alert and cooperative. Normal mood and affect   Assessment and Recommendations:  1. GERD with LPR.  Change to Nexium 40 mg p.o. twice daily taken 30 minutes before breakfast and dinner.  Closely follow antireflux measures.  Begin the use of 4 inch bed blocks.  Schedule EGD. The risks (including bleeding, perforation, infection, missed lesions, medication reactions and possible hospitalization or surgery if complications occur), benefits, and alternatives to endoscopy with  possible biopsy and possible dilation were discussed with the patient and they consent to proceed.   2.  Internal and external hemorrhoids.  Standard rectal care instructions.  Anusol HC cream twice daily as needed

## 2019-09-26 ENCOUNTER — Other Ambulatory Visit: Payer: Self-pay

## 2019-09-26 ENCOUNTER — Ambulatory Visit (HOSPITAL_COMMUNITY)
Admission: RE | Admit: 2019-09-26 | Discharge: 2019-09-26 | Disposition: A | Discharge status: Home / Self Care | Payer: Medicare Other | Source: Ambulatory Visit | Attending: Cardiology | Admitting: Cardiology

## 2019-09-26 ENCOUNTER — Encounter (HOSPITAL_COMMUNITY): Payer: Self-pay | Admitting: Cardiology

## 2019-09-26 VITALS — BP 130/80 | HR 70 | Wt 166.4 lb

## 2019-09-26 DIAGNOSIS — R200 Anesthesia of skin: Secondary | ICD-10-CM | POA: Diagnosis not present

## 2019-09-26 DIAGNOSIS — I493 Ventricular premature depolarization: Secondary | ICD-10-CM | POA: Diagnosis not present

## 2019-09-26 DIAGNOSIS — Z881 Allergy status to other antibiotic agents status: Secondary | ICD-10-CM | POA: Diagnosis not present

## 2019-09-26 DIAGNOSIS — Z823 Family history of stroke: Secondary | ICD-10-CM | POA: Insufficient documentation

## 2019-09-26 DIAGNOSIS — E119 Type 2 diabetes mellitus without complications: Secondary | ICD-10-CM | POA: Diagnosis not present

## 2019-09-26 DIAGNOSIS — I480 Paroxysmal atrial fibrillation: Secondary | ICD-10-CM | POA: Diagnosis not present

## 2019-09-26 DIAGNOSIS — Z950 Presence of cardiac pacemaker: Secondary | ICD-10-CM

## 2019-09-26 DIAGNOSIS — R0609 Other forms of dyspnea: Secondary | ICD-10-CM

## 2019-09-26 DIAGNOSIS — E854 Organ-limited amyloidosis: Secondary | ICD-10-CM | POA: Diagnosis not present

## 2019-09-26 DIAGNOSIS — I5032 Chronic diastolic (congestive) heart failure: Secondary | ICD-10-CM

## 2019-09-26 DIAGNOSIS — I43 Cardiomyopathy in diseases classified elsewhere: Secondary | ICD-10-CM

## 2019-09-26 DIAGNOSIS — I442 Atrioventricular block, complete: Secondary | ICD-10-CM | POA: Diagnosis not present

## 2019-09-26 DIAGNOSIS — Z7982 Long term (current) use of aspirin: Secondary | ICD-10-CM | POA: Diagnosis not present

## 2019-09-26 DIAGNOSIS — Z8041 Family history of malignant neoplasm of ovary: Secondary | ICD-10-CM | POA: Insufficient documentation

## 2019-09-26 DIAGNOSIS — I495 Sick sinus syndrome: Secondary | ICD-10-CM | POA: Diagnosis not present

## 2019-09-26 DIAGNOSIS — Z888 Allergy status to other drugs, medicaments and biological substances status: Secondary | ICD-10-CM | POA: Insufficient documentation

## 2019-09-26 DIAGNOSIS — Z8249 Family history of ischemic heart disease and other diseases of the circulatory system: Secondary | ICD-10-CM | POA: Insufficient documentation

## 2019-09-26 DIAGNOSIS — R9431 Abnormal electrocardiogram [ECG] [EKG]: Secondary | ICD-10-CM | POA: Diagnosis not present

## 2019-09-26 DIAGNOSIS — R55 Syncope and collapse: Secondary | ICD-10-CM | POA: Insufficient documentation

## 2019-09-26 DIAGNOSIS — I471 Supraventricular tachycardia: Secondary | ICD-10-CM | POA: Diagnosis not present

## 2019-09-26 DIAGNOSIS — K589 Irritable bowel syndrome without diarrhea: Secondary | ICD-10-CM | POA: Insufficient documentation

## 2019-09-26 DIAGNOSIS — R0789 Other chest pain: Secondary | ICD-10-CM | POA: Diagnosis not present

## 2019-09-26 DIAGNOSIS — I2510 Atherosclerotic heart disease of native coronary artery without angina pectoris: Secondary | ICD-10-CM | POA: Insufficient documentation

## 2019-09-26 DIAGNOSIS — Z8541 Personal history of malignant neoplasm of cervix uteri: Secondary | ICD-10-CM | POA: Diagnosis not present

## 2019-09-26 DIAGNOSIS — I110 Hypertensive heart disease with heart failure: Secondary | ICD-10-CM | POA: Insufficient documentation

## 2019-09-26 DIAGNOSIS — Z833 Family history of diabetes mellitus: Secondary | ICD-10-CM | POA: Insufficient documentation

## 2019-09-26 DIAGNOSIS — Z79899 Other long term (current) drug therapy: Secondary | ICD-10-CM | POA: Diagnosis not present

## 2019-09-26 DIAGNOSIS — Z825 Family history of asthma and other chronic lower respiratory diseases: Secondary | ICD-10-CM | POA: Diagnosis not present

## 2019-09-26 DIAGNOSIS — R0600 Dyspnea, unspecified: Secondary | ICD-10-CM

## 2019-09-26 DIAGNOSIS — Z87820 Personal history of traumatic brain injury: Secondary | ICD-10-CM | POA: Diagnosis not present

## 2019-09-26 DIAGNOSIS — Z801 Family history of malignant neoplasm of trachea, bronchus and lung: Secondary | ICD-10-CM | POA: Insufficient documentation

## 2019-09-26 DIAGNOSIS — I5030 Unspecified diastolic (congestive) heart failure: Secondary | ICD-10-CM | POA: Diagnosis present

## 2019-09-26 DIAGNOSIS — G8929 Other chronic pain: Secondary | ICD-10-CM | POA: Insufficient documentation

## 2019-09-26 LAB — BRAIN NATRIURETIC PEPTIDE: B Natriuretic Peptide: 55.4 pg/mL (ref 0.0–100.0)

## 2019-09-26 LAB — BASIC METABOLIC PANEL
Anion gap: 10 (ref 5–15)
BUN: 6 mg/dL — ABNORMAL LOW (ref 8–23)
CO2: 25 mmol/L (ref 22–32)
Calcium: 9.7 mg/dL (ref 8.9–10.3)
Chloride: 105 mmol/L (ref 98–111)
Creatinine, Ser: 0.74 mg/dL (ref 0.44–1.00)
GFR calc Af Amer: >60 mL/min (ref >60)
GFR calc non Af Amer: >60 mL/min (ref >60)
Glucose, Bld: 108 mg/dL — ABNORMAL HIGH (ref 70–99)
Potassium: 3.3 mmol/L — ABNORMAL LOW (ref 3.5–5.1)
Sodium: 140 mmol/L (ref 135–145)
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
## 2019-09-26 NOTE — Progress Notes (Signed)
ReDS Vest / Clip - 09/26/19 1300      ReDS Vest / Clip   Station Marker  B    Ruler Value  34    ReDS Value Range  Low volume    ReDS Actual Value  34    Anatomical Comments  sitting

## 2019-09-26 NOTE — Patient Instructions (Signed)
NO medication changes today!   Labs today We will only contact you if something comes back abnormal or we need to make some changes. Otherwise no news is good news!    You have been ordered for a PYP scan. This is done in Radiology.  You will get a call to schedule this appointment.    Your physician has requested that you have an echocardiogram. Echocardiography is a painless test that uses sound waves to create images of your heart. It provides your doctor with information about the size and shape of your heart and how well your heart's chambers and valves are working. This procedure takes approximately one hour. There are no restrictions for this procedure.   Your physician recommends that you schedule a follow-up appointment in: 6 months. We will call you to schedule this appointment. Please call our office in August if you do not get a call.    Please call office at [phone] option 2 if you have any questions or concerns.   At the [facility], you and your health needs are our priority. As part of our continuing mission to provide you with exceptional heart care, we have created designated Provider Care Teams. These Care Teams include your primary Cardiologist (physician) and Advanced Practice Providers (APPs- Physician Assistants and Nurse Practitioners) who all work together to provide you with the care you need, when you need it.   You may see any of the following providers on your designated Care Team at your next follow up: Marland Kitchen Dr Glori Bickers . Dr Loralie Champagne . Darrick Grinder, NP . Lyda Jester, PA . Audry Riles, PharmD   Please be sure to bring in all your medications bottles to every appointment.

## 2019-09-27 ENCOUNTER — Telehealth (HOSPITAL_COMMUNITY): Payer: Self-pay | Admitting: *Deleted

## 2019-09-27 ENCOUNTER — Telehealth (HOSPITAL_COMMUNITY): Payer: Self-pay

## 2019-09-27 ENCOUNTER — Encounter (HOSPITAL_COMMUNITY): Payer: Self-pay | Admitting: *Deleted

## 2019-09-27 ENCOUNTER — Encounter: Payer: Self-pay | Admitting: Gastroenterology

## 2019-09-27 LAB — IMMUNOFIXATION, URINE

## 2019-09-27 MED ORDER — POTASSIUM CHLORIDE ER 10 MEQ PO TBCR: 30.0000 meq | EXTENDED_RELEASE_TABLET | Freq: Every day | ORAL | 3 refills | Status: DC

## 2019-09-27 NOTE — Telephone Encounter (Signed)
I think she would be safe to have an endoscopy if it is needed.

## 2019-09-27 NOTE — Telephone Encounter (Signed)
Pt aware of results and recommendations to increase potassium by 20 meq. Pt verbalized understanding.

## 2019-09-27 NOTE — Telephone Encounter (Signed)
Pt called asking if she should hold off on having endoscopy (scheduled for 4/20) until after shes has her echo and pyp test. Pt stated she has "lived in her body with heart problems for many years" and wants a direct answer from [facility] if she should have endoscopy done. Pt informed me during phone call that she already cancelled her endoscopy but still wants to know if its safe for her to have done in the future.    Routed to Lindsay for advice

## 2019-09-27 NOTE — Telephone Encounter (Signed)
Left detailed VM and sent advice via my chart.

## 2019-09-27 NOTE — Telephone Encounter (Signed)
-----   Message from Larey Dresser, MD sent at 09/26/2019  9:56 PM EDT ----- Increase KCl to 20 mEq daily.

## 2019-09-30 ENCOUNTER — Ambulatory Visit (HOSPITAL_COMMUNITY)
Admission: RE | Admit: 2019-09-30 | Discharge: 2019-09-30 | Disposition: A | Discharge status: Home / Self Care | Payer: Medicare Other | Source: Ambulatory Visit | Attending: Cardiology | Admitting: Cardiology

## 2019-09-30 ENCOUNTER — Telehealth: Payer: Self-pay

## 2019-09-30 ENCOUNTER — Other Ambulatory Visit: Payer: Self-pay

## 2019-09-30 DIAGNOSIS — I43 Cardiomyopathy in diseases classified elsewhere: Secondary | ICD-10-CM | POA: Insufficient documentation

## 2019-09-30 DIAGNOSIS — Z950 Presence of cardiac pacemaker: Secondary | ICD-10-CM | POA: Insufficient documentation

## 2019-09-30 DIAGNOSIS — R0602 Shortness of breath: Secondary | ICD-10-CM | POA: Insufficient documentation

## 2019-09-30 DIAGNOSIS — R9431 Abnormal electrocardiogram [ECG] [EKG]: Secondary | ICD-10-CM | POA: Insufficient documentation

## 2019-09-30 DIAGNOSIS — R55 Syncope and collapse: Secondary | ICD-10-CM | POA: Insufficient documentation

## 2019-09-30 DIAGNOSIS — I110 Hypertensive heart disease with heart failure: Secondary | ICD-10-CM | POA: Insufficient documentation

## 2019-09-30 DIAGNOSIS — I5030 Unspecified diastolic (congestive) heart failure: Secondary | ICD-10-CM | POA: Insufficient documentation

## 2019-09-30 DIAGNOSIS — I509 Heart failure, unspecified: Secondary | ICD-10-CM | POA: Diagnosis present

## 2019-09-30 DIAGNOSIS — I493 Ventricular premature depolarization: Secondary | ICD-10-CM | POA: Diagnosis not present

## 2019-09-30 DIAGNOSIS — E854 Organ-limited amyloidosis: Secondary | ICD-10-CM | POA: Diagnosis not present

## 2019-09-30 DIAGNOSIS — R079 Chest pain, unspecified: Secondary | ICD-10-CM | POA: Insufficient documentation

## 2019-09-30 NOTE — Telephone Encounter (Signed)
Pt was scheduled to see Dr. Fuller Plan on 10/01/19 at 4pm. I tried to call pt to see if she could come in earlier to get procedure done. Pt stated that she thought this procedure was canceled. She stated that she wanted to wait until after her echocardiogram to do the endoscopy. Cancelled procedure at the patients request, and gave office number to patient so she could call and reschedule at her earliest convenience.

## 2019-09-30 NOTE — Progress Notes (Signed)
  Echocardiogram 2D Echocardiogram has been performed.  Renee [name] 09/30/2019, 9:56 AM

## 2019-10-01 ENCOUNTER — Encounter: Payer: Medicare Other | Admitting: Gastroenterology

## 2019-10-01 ENCOUNTER — Other Ambulatory Visit (HOSPITAL_COMMUNITY): Payer: Medicare Other

## 2019-10-01 LAB — MULTIPLE MYELOMA PANEL, SERUM
Albumin SerPl Elph-Mcnc: 4.1 g/dL (ref 2.9–4.4)
Albumin/Glob SerPl: 1.5 (ref 0.7–1.7)
Alpha 1: 0.2 g/dL (ref 0.0–0.4)
Alpha2 Glob SerPl Elph-Mcnc: 0.7 g/dL (ref 0.4–1.0)
B-Globulin SerPl Elph-Mcnc: 1.1 g/dL (ref 0.7–1.3)
Gamma Glob SerPl Elph-Mcnc: 0.9 g/dL (ref 0.4–1.8)
Globulin, Total: 2.8 g/dL (ref 2.2–3.9)
IgA: 225 mg/dL (ref 64–422)
IgG (Immunoglobin G), Serum: 1023 mg/dL (ref 586–1602)
IgM (Immunoglobulin M), Srm: 108 mg/dL (ref 26–217)
Total Protein ELP: 6.9 g/dL (ref 6.0–8.5)

## 2019-10-02 ENCOUNTER — Telehealth: Payer: Self-pay

## 2019-10-02 MED ORDER — ALPRAZOLAM 0.5 MG PO TABS: 0.2500 mg | ORAL_TABLET | Freq: Two times a day (BID) | ORAL | 1 refills | Status: DC | PRN

## 2019-10-02 NOTE — Telephone Encounter (Signed)
Done erx 

## 2019-10-09 ENCOUNTER — Encounter (HOSPITAL_COMMUNITY): Payer: Self-pay | Admitting: *Deleted

## 2019-10-09 ENCOUNTER — Other Ambulatory Visit: Payer: Self-pay

## 2019-10-09 ENCOUNTER — Emergency Department (HOSPITAL_COMMUNITY)
Admission: EM | Admit: 2019-10-09 | Discharge: 2019-10-09 | Disposition: A | Discharge status: Home / Self Care | Payer: Medicare Other | Attending: Emergency Medicine | Admitting: Emergency Medicine

## 2019-10-09 ENCOUNTER — Telehealth: Payer: Self-pay | Admitting: Gastroenterology

## 2019-10-09 DIAGNOSIS — J029 Acute pharyngitis, unspecified: Secondary | ICD-10-CM | POA: Insufficient documentation

## 2019-10-09 DIAGNOSIS — Z5321 Procedure and treatment not carried out due to patient leaving prior to being seen by health care provider: Secondary | ICD-10-CM | POA: Diagnosis not present

## 2019-10-09 NOTE — Telephone Encounter (Signed)
Patient has been rescheduled to 10/16/19 1:30.  She verbalized understanding of arrival times

## 2019-10-09 NOTE — Telephone Encounter (Signed)
There is a cancellation for tomorrow 10-10-2019 at 11:30am. Is it appropriate to move her up?

## 2019-10-09 NOTE — ED Notes (Signed)
Pt stated that she is leaving. Pt has walked out of building.

## 2019-10-09 NOTE — Telephone Encounter (Signed)
Pt reported that she had woken up at 3:30 am and felt that "a razor blade was stuck in her throat."  She stated that she could not speak until 8:00 am.  Pt is currently waiting in the ER and [facility] hospital and requested a sooner appt.

## 2019-10-09 NOTE — Telephone Encounter (Signed)
Patient called back she said she has been waiting for 4 hours and they told her it would be about another 9 hours before she is seen at ED and she has decided not to wait any longer and leave. Please advise

## 2019-10-09 NOTE — Telephone Encounter (Signed)
Patient currently in the ED will await their eval

## 2019-10-09 NOTE — ED Triage Notes (Signed)
Pt reports ongoing difficulty with hoarseness and loosing her voice. Has been seen by multiple providers and test performed with no answer. Had episode this am of razor blade pain to right throat. Airway intact at triage.

## 2019-10-14 ENCOUNTER — Telehealth (HOSPITAL_COMMUNITY): Payer: Self-pay | Admitting: *Deleted

## 2019-10-14 NOTE — Telephone Encounter (Signed)
Pt called inquiring about radiology procedure (PYP). She said she had not heard from scheduling. I called pt and told her we have to get it approved by her insurance first and it is still in clinical review. Once it is approved by her insurance our scheduler will contact her.

## 2019-10-15 ENCOUNTER — Telehealth: Payer: Self-pay

## 2019-10-15 NOTE — Telephone Encounter (Signed)
New message    Offer an appt for the patient to see MD patient voiced she only sees the MD for a physical and does not need to come into the office, only wants Dr. Judi Cong to refill the medication.    1.Medication Requested:ALPRAZolam (XANAX) 0.5 MG tablet  2. Pharmacy (Name, Street, [place]):[facility]  3. On Med List: Yes   4. Last Visit with PCP: 9.22.2020   5. Next visit date with PCP: no appt is made at this time    Agent: Please be advised that RX refills may take up to 3 business days. We ask that you follow-up with your pharmacy.

## 2019-10-16 ENCOUNTER — Other Ambulatory Visit: Payer: Self-pay

## 2019-10-16 ENCOUNTER — Telehealth (HOSPITAL_COMMUNITY): Payer: Self-pay | Admitting: *Deleted

## 2019-10-16 ENCOUNTER — Encounter: Payer: Self-pay | Admitting: Gastroenterology

## 2019-10-16 ENCOUNTER — Ambulatory Visit (AMBULATORY_SURGERY_CENTER): Payer: Medicare Other | Admitting: Gastroenterology

## 2019-10-16 VITALS — BP 133/85 | HR 65 | Temp 96.8°F | Resp 14 | Ht 63.0 in | Wt 168.0 lb

## 2019-10-16 DIAGNOSIS — K449 Diaphragmatic hernia without obstruction or gangrene: Secondary | ICD-10-CM

## 2019-10-16 DIAGNOSIS — K228 Other specified diseases of esophagus: Secondary | ICD-10-CM | POA: Diagnosis not present

## 2019-10-16 DIAGNOSIS — K229 Disease of esophagus, unspecified: Secondary | ICD-10-CM

## 2019-10-16 DIAGNOSIS — K2970 Gastritis, unspecified, without bleeding: Secondary | ICD-10-CM

## 2019-10-16 DIAGNOSIS — K2950 Unspecified chronic gastritis without bleeding: Secondary | ICD-10-CM | POA: Diagnosis not present

## 2019-10-16 DIAGNOSIS — K219 Gastro-esophageal reflux disease without esophagitis: Secondary | ICD-10-CM | POA: Diagnosis not present

## 2019-10-16 MED ORDER — SODIUM CHLORIDE 0.9 % IV SOLN: 500.0000 mL | Freq: Once | INTRAVENOUS | Status: DC

## 2019-10-16 NOTE — Progress Notes (Signed)
Report to PACU, RN, vss, BBS= Clear.  

## 2019-10-16 NOTE — Progress Notes (Signed)
Called to room to assist during endoscopic procedure.  Patient ID and intended procedure confirmed with present staff. Received instructions for my participation in the procedure from the performing physician.  

## 2019-10-16 NOTE — Telephone Encounter (Signed)
PYP in clinical review with [employer]. Case [id number]

## 2019-10-16 NOTE — Patient Instructions (Signed)
YOU HAD AN ENDOSCOPIC PROCEDURE TODAY AT THE [facility]:   Refer to the procedure report that was given to you for any specific questions about what was found during the examination.  If the procedure report does not answer your questions, please call your gastroenterologist to clarify.  If you requested that your care partner not be given the details of your procedure findings, then the procedure report has been included in a sealed envelope for you to review at your convenience later.  **Handouts given on Gastritis and Hiatal Hernia**  YOU SHOULD EXPECT: Some feelings of bloating in the abdomen. Passage of more gas than usual.  Walking can help get rid of the air that was put into your GI tract during the procedure and reduce the bloating. If you had a lower endoscopy (such as a colonoscopy or flexible sigmoidoscopy) you may notice spotting of blood in your stool or on the toilet paper. If you underwent a bowel prep for your procedure, you may not have a normal bowel movement for a few days.  Please Note:  You might notice some irritation and congestion in your nose or some drainage.  This is from the oxygen used during your procedure.  There is no need for concern and it should clear up in a day or so.  SYMPTOMS TO REPORT IMMEDIATELY:    Following upper endoscopy (EGD)  Vomiting of blood or coffee ground material  New chest pain or pain under the shoulder blades  Painful or persistently difficult swallowing  New shortness of breath  Fever of 100F or higher  Black, tarry-looking stools  For urgent or emergent issues, a gastroenterologist can be reached at any hour by calling [phone]. Do not use MyChart messaging for urgent concerns.    DIET:  We do recommend a small meal at first, but then you may proceed to your regular diet.  Drink plenty of fluids but you should avoid alcoholic beverages for 24 hours.  ACTIVITY:  You should plan to take it easy for the rest of  today and you should NOT DRIVE or use heavy machinery until tomorrow (because of the sedation medicines used during the test).    FOLLOW UP: Our staff will call the number listed on your records 48-72 hours following your procedure to check on you and address any questions or concerns that you may have regarding the information given to you following your procedure. If we do not reach you, we will leave a message.  We will attempt to reach you two times.  During this call, we will ask if you have developed any symptoms of COVID 19. If you develop any symptoms (ie: fever, flu-like symptoms, shortness of breath, cough etc.) before then, please call [phone].  If you test positive for Covid 19 in the 2 weeks post procedure, please call and report this information to us.    If any biopsies were taken you will be contacted by phone or by letter within the next 1-3 weeks.  Please call us at [phone] if you have not heard about the biopsies in 3 weeks.    SIGNATURES/CONFIDENTIALITY: You and/or your care partner have signed paperwork which will be entered into your electronic medical record.  These signatures attest to the fact that that the information above on your After Visit Summary has been reviewed and is understood.  Full responsibility of the confidentiality of this discharge information lies with you and/or your care-partner. 

## 2019-10-16 NOTE — Op Note (Signed)
[facility] Patient Name: Renee [name] Procedure Date: 10/16/2019 1:36 PM MRN: [id number] Endoscopist: Ladene Artist , MD Age: 74 Referring MD:  Date of Birth: 19-Aug-1945 Gender: Female Account #: [id number] Procedure:                Upper GI endoscopy Indications:              Gastroesophageal reflux disease Medicines:                Monitored Anesthesia Care Procedure:                Pre-Anesthesia Assessment:                           - Prior to the procedure, a History and Physical                            was performed, and patient medications and                            allergies were reviewed. The patient's tolerance of                            previous anesthesia was also reviewed. The risks                            and benefits of the procedure and the sedation                            options and risks were discussed with the patient.                            All questions were answered, and informed consent                            was obtained. Prior Anticoagulants: The patient has                            taken no previous anticoagulant or antiplatelet                            agents. ASA Grade Assessment: II - A patient with                            mild systemic disease. After reviewing the risks                            and benefits, the patient was deemed in                            satisfactory condition to undergo the procedure.                           After obtaining informed consent, the endoscope was  passed under direct vision. Throughout the                            procedure, the patient's blood pressure, pulse, and                            oxygen saturations were monitored continuously. The                            Endoscope was introduced through the mouth, and                            advanced to the second part of duodenum. The upper                            GI endoscopy was  accomplished without difficulty.                            The patient tolerated the procedure well. Scope In: Scope Out: Findings:                 The Z-line was variable and was found 35 cm from                            the incisors. Biopsies were taken with a cold                            forceps for histology.                           The exam of the esophagus was otherwise normal.                           Localized mild inflammation characterized by                            friability and granularity was found in the                            prepyloric region of the stomach. Biopsies were                            taken with a cold forceps for histology.                           A small hiatal hernia was present.                           The exam of the stomach was otherwise normal.                           The duodenal bulb and second portion of the  duodenum were normal. Complications:            No immediate complications. Estimated Blood Loss:     Estimated blood loss was minimal. Impression:               - Z-line variable, 35 cm from the incisors.                            Biopsied.                           - Gastritis. Biopsied.                           - Small hiatal hernia.                           - Normal duodenal bulb and second portion of the                            duodenum. Recommendation:           - Patient has a contact number available for                            emergencies. The signs and symptoms of potential                            delayed complications were discussed with the                            patient. Return to normal activities tomorrow.                            Written discharge instructions were provided to the                            patient.                           - Resume previous diet.                           - Follow antireflux measures long term.                           -  Continue present medications.                           - Await pathology results.                           - Return to GI office in 2 months. Ladene Artist, MD 10/16/2019 1:58:33 PM This report has been signed electronically.

## 2019-10-16 NOTE — Progress Notes (Signed)
Temp check by:JB Vital check by:CW  The medical and surgical history was reviewed and verified with the patient. 

## 2019-10-18 ENCOUNTER — Telehealth: Payer: Self-pay | Admitting: *Deleted

## 2019-10-18 NOTE — Telephone Encounter (Signed)
error 

## 2019-10-18 NOTE — Telephone Encounter (Signed)
1. Have you developed a fever since your procedure? no  2.   Have you had an respiratory symptoms (SOB or cough) since your procedure? no  3.   Have you tested positive for COVID 19 since your procedure no  4.   Have you had any family members/close contacts diagnosed with the COVID 19 since your procedure?  no   If yes to any of these questions please route to Joylene John, RN and Erenest Rasher, RN Follow up Call-  Call back number 10/16/2019 08/15/2018  Post procedure Call Back phone  # [phone]  Permission to leave phone message Yes Yes  Some recent data might be hidden     Patient questions:  Do you have a fever, pain , or abdominal swelling? No. Pain Score  0 *  Have you tolerated food without any problems? Yes.    Have you been able to return to your normal activities? Yes.    Do you have any questions about your discharge instructions: Diet   No. Medications  No. Follow up visit  No.  Do you have questions or concerns about your Care? No.  Actions: * If pain score is 4 or above: No action needed, pain <4. Pt c/o no voice ,unable to to whisper for several hours. Pt was having this problem before procedure . Pt is very concerned and feels she is getting a run around from ENT Dr as well as other Drs and wants Dr Fuller Plan to be aware of her concerns . Encouraged pt to follow anti reflux regimen and wait for biopsy results and make her f/u appointment with Dr Fuller Plan.

## 2019-10-21 ENCOUNTER — Encounter: Payer: Medicare Other | Admitting: Gastroenterology

## 2019-10-29 ENCOUNTER — Encounter: Payer: Self-pay | Admitting: Gastroenterology

## 2019-11-12 ENCOUNTER — Ambulatory Visit (INDEPENDENT_AMBULATORY_CARE_PROVIDER_SITE_OTHER): Payer: Medicare Other | Admitting: *Deleted

## 2019-11-12 DIAGNOSIS — R55 Syncope and collapse: Secondary | ICD-10-CM

## 2019-11-12 LAB — CUP PACEART REMOTE DEVICE CHECK
Date Time Interrogation Session: 20210601143953
Implantable Lead Implant Date: 20170921
Implantable Lead Implant Date: 20170921
Implantable Lead Location: 753859
Implantable Lead Location: 753860
Implantable Lead Model: 377
Implantable Lead Model: 377
Implantable Lead Serial Number: 49575519
Implantable Lead Serial Number: 49580836
Implantable Pulse Generator Implant Date: 20170921
Pulse Gen Model: 407145
Pulse Gen Serial Number: 68850891

## 2019-11-13 NOTE — Progress Notes (Signed)
Remote pacemaker transmission.   

## 2019-12-10 ENCOUNTER — Ambulatory Visit (INDEPENDENT_AMBULATORY_CARE_PROVIDER_SITE_OTHER): Payer: Medicare Other

## 2019-12-10 ENCOUNTER — Encounter: Payer: Self-pay | Admitting: Internal Medicine

## 2019-12-10 ENCOUNTER — Other Ambulatory Visit: Payer: Self-pay

## 2019-12-10 ENCOUNTER — Ambulatory Visit (INDEPENDENT_AMBULATORY_CARE_PROVIDER_SITE_OTHER): Payer: Medicare Other | Admitting: Internal Medicine

## 2019-12-10 VITALS — BP 124/82 | HR 71 | Temp 98.6°F | Ht 63.0 in | Wt 164.0 lb

## 2019-12-10 DIAGNOSIS — R1013 Epigastric pain: Secondary | ICD-10-CM | POA: Diagnosis not present

## 2019-12-10 DIAGNOSIS — R0600 Dyspnea, unspecified: Secondary | ICD-10-CM

## 2019-12-10 DIAGNOSIS — R0789 Other chest pain: Secondary | ICD-10-CM

## 2019-12-10 DIAGNOSIS — R079 Chest pain, unspecified: Secondary | ICD-10-CM | POA: Diagnosis not present

## 2019-12-10 LAB — CBC
HCT: 39.9 % (ref 36.0–46.0)
Hemoglobin: 13.2 g/dL (ref 12.0–15.0)
MCHC: 33.1 g/dL (ref 30.0–36.0)
MCV: 81.2 fl (ref 78.0–100.0)
Platelets: 274 10*3/uL (ref 150.0–400.0)
RBC: 4.92 Mil/uL (ref 3.87–5.11)
RDW: 13.3 % (ref 11.5–15.5)
WBC: 7.6 10*3/uL (ref 4.0–10.5)

## 2019-12-10 LAB — COMPREHENSIVE METABOLIC PANEL
ALT: 16 U/L (ref 0–35)
AST: 16 U/L (ref 0–37)
Albumin: 4.7 g/dL (ref 3.5–5.2)
Alkaline Phosphatase: 116 U/L (ref 39–117)
BUN: 12 mg/dL (ref 6–23)
CO2: 28 mEq/L (ref 19–32)
Calcium: 9.9 mg/dL (ref 8.4–10.5)
Chloride: 98 mEq/L (ref 96–112)
Creatinine, Ser: 0.8 mg/dL (ref 0.40–1.20)
GFR: 70.06 mL/min (ref >60.00)
Glucose, Bld: 96 mg/dL (ref 70–99)
Potassium: 3.2 mEq/L — ABNORMAL LOW (ref 3.5–5.1)
Sodium: 135 mEq/L (ref 135–145)
Total Bilirubin: 1.3 mg/dL — ABNORMAL HIGH (ref 0.2–1.2)
Total Protein: 7.5 g/dL (ref 6.0–8.3)

## 2019-12-10 LAB — BRAIN NATRIURETIC PEPTIDE: Pro B Natriuretic peptide (BNP): 50 pg/mL (ref 0.0–100.0)

## 2019-12-10 LAB — LIPASE: Lipase: 25 U/L (ref 11.0–59.0)

## 2019-12-10 MED ORDER — HYDROCODONE-ACETAMINOPHEN 5-325 MG PO TABS: 1.0000 | ORAL_TABLET | Freq: Three times a day (TID) | ORAL | 0 refills | Status: DC | PRN

## 2019-12-10 NOTE — Assessment & Plan Note (Signed)
EKG done today and checking CXR due to new sputum and SOB with pain. Does not sound typical for cardiac. Checking BNP.

## 2019-12-10 NOTE — Assessment & Plan Note (Addendum)
Checking CBC, CMP, lipase today and BNP. Checking CXR given new SOB and sputum as this can cause her pain. If no etiology found needs CT abdomen to assess. She is taking nexium already and nexium is already at BID per GI due to recent gastritis on EGD. Rx hydrocodone for pain temporary and asked not to take NSAIDs given recent gastritis.

## 2019-12-10 NOTE — Patient Instructions (Addendum)
We have done an EKG of the heart today which looks the same as before.   We are checking labs and a chest x-ray to try to find the cause of the pain.   We have sent in hydrocodone to use for pain in the meantime until we can figure this out.

## 2019-12-10 NOTE — Assessment & Plan Note (Signed)
Worse than usual and checking BNP and CXR to assess. Treat as appropriate. May be related to pain and shallow inspiration due to the epigastric pain.

## 2019-12-10 NOTE — Progress Notes (Signed)
   Subjective:   Patient ID: Renee [name], female    DOB: September 11, 1945, 74 y.o.   MRN: [id number]  HPI The patient is a 74 YO female coming in for pain. Started on Sunday with pain from head to toes and some chills. She did call EMS and they advised she go to an ER in [place] where she lives. She did not do this but decided to come in today when it was not improving. She denies fevers. She does have improvement in the head to toe pain today. She is having pain in the upper stomach/lower chest and the mid back which is concerning to her. 10/10 pain. Has not been able to eat or drink much since Sunday due to nausea and pain. Denies vomiting. Chronic constipation but no worse than usual. Denies diarrhea or dark stool/blood in stool. Denies headaches. Has taken ibuprofen yesterday which did help the pain some. Overall the pain in her stomach and back is not improving. Had some sputum which was not usual for her on Sunday and more SOB since that time. Denies cough Monday or Tuesday. She has been fully vaccinated against covid-19.  PMH, [facility], social history reviewed and updated  Review of Systems  Constitutional: Positive for activity change, appetite change and chills. Negative for fatigue, fever and unexpected weight change.  Respiratory: Positive for cough and shortness of breath.   Cardiovascular: Positive for chest pain.  Gastrointestinal: Positive for abdominal pain and nausea. Negative for blood in stool, constipation, diarrhea, rectal pain and vomiting.  Musculoskeletal: Positive for arthralgias, back pain and myalgias. Negative for gait problem and joint swelling.  Skin: Negative.   Neurological: Negative.     Objective:  Physical Exam Constitutional:      Appearance: She is well-developed.  HENT:     Head: Normocephalic and atraumatic.  Cardiovascular:     Rate and Rhythm: Normal rate and regular rhythm.  Pulmonary:     Effort: Pulmonary effort is normal. No respiratory distress.      Breath sounds: Normal breath sounds. No wheezing or rales.  Abdominal:     General: Bowel sounds are normal. There is no distension.     Palpations: Abdomen is soft.     Tenderness: There is abdominal tenderness. There is no rebound.     Comments: Tenderness to palpation epigastric region, no pain other quadrants, normal BS  Musculoskeletal:        General: Tenderness present.     Cervical back: Normal range of motion.     Comments: Pain at the scapular level bilaterally  Skin:    General: Skin is warm and dry.  Neurological:     Mental Status: She is alert and oriented to person, place, and time.     Coordination: Coordination normal.     Vitals:   12/10/19 1337  BP: 124/82  Pulse: 71  Temp: 98.6 F (37 C)  TempSrc: Oral  SpO2: 98%  Weight: 164 lb (74.4 kg)  Height: 5\' 3"  (1.6 m)   EKG: Rate 65, sinus, axis normal, interval normal, stable st/t wave changes, no significant change compared to 09/2019  This visit occurred during the SARS-CoV-2 public health emergency.  Safety protocols were in place, including screening questions prior to the visit, additional usage of staff PPE, and extensive cleaning of exam room while observing appropriate contact time as indicated for disinfecting solutions.   Assessment & Plan:

## 2019-12-11 ENCOUNTER — Telehealth: Payer: Self-pay | Admitting: Internal Medicine

## 2019-12-11 ENCOUNTER — Telehealth: Payer: Self-pay

## 2019-12-11 ENCOUNTER — Other Ambulatory Visit: Payer: Self-pay | Admitting: Internal Medicine

## 2019-12-11 DIAGNOSIS — R1013 Epigastric pain: Secondary | ICD-10-CM

## 2019-12-11 MED ORDER — ONDANSETRON HCL 4 MG PO TABS: 4.0000 mg | ORAL_TABLET | Freq: Three times a day (TID) | ORAL | 0 refills | Status: DC | PRN

## 2019-12-11 MED ORDER — HYDROCODONE-ACETAMINOPHEN 5-325 MG PO TABS: 1.0000 | ORAL_TABLET | Freq: Three times a day (TID) | ORAL | 0 refills | Status: AC | PRN

## 2019-12-11 NOTE — Telephone Encounter (Signed)
New message:   [place] is calling and states Zolfran was prescribed and she states the pt has a long QT interval. Please advise.

## 2019-12-11 NOTE — Telephone Encounter (Signed)
New Message  Patient is calling in to see if she is supposed to be picking up a solution that she is supposed to drink for tomorrow's CT. Please advise.

## 2019-12-11 NOTE — Telephone Encounter (Signed)
New message  The patient is asking for nausea medication to be called in as well.     1.Medication Requested:HYDROcodone-acetaminophen (NORCO/VICODIN) 5-325 MG tablet - re-routed to [facility] in [place] the mail order takes 5-7 days to received   2. Pharmacy (Name, Street, [place]):[facility], [place] - [id number]  3. On Med List: Yes   4. Last Visit with PCP: 6.30.21 - seen Dr. Sharlet Salina   5. Next visit date with PCP: n/a    Agent: Please be advised that RX refills may take up to 3 business days. We ask that you follow-up with your pharmacy.

## 2019-12-11 NOTE — Telephone Encounter (Signed)
See result note for details.

## 2019-12-12 ENCOUNTER — Other Ambulatory Visit: Payer: Self-pay

## 2019-12-12 ENCOUNTER — Ambulatory Visit (INDEPENDENT_AMBULATORY_CARE_PROVIDER_SITE_OTHER)
Admission: RE | Admit: 2019-12-12 | Discharge: 2019-12-12 | Disposition: A | Discharge status: Home / Self Care | Payer: Medicare Other | Source: Ambulatory Visit | Attending: Internal Medicine | Admitting: Internal Medicine

## 2019-12-12 ENCOUNTER — Telehealth: Payer: Self-pay | Admitting: Internal Medicine

## 2019-12-12 DIAGNOSIS — R1013 Epigastric pain: Secondary | ICD-10-CM | POA: Diagnosis not present

## 2019-12-12 DIAGNOSIS — Z9049 Acquired absence of other specified parts of digestive tract: Secondary | ICD-10-CM | POA: Diagnosis not present

## 2019-12-12 DIAGNOSIS — R110 Nausea: Secondary | ICD-10-CM | POA: Diagnosis not present

## 2019-12-12 DIAGNOSIS — R1084 Generalized abdominal pain: Secondary | ICD-10-CM | POA: Diagnosis not present

## 2019-12-12 DIAGNOSIS — K7689 Other specified diseases of liver: Secondary | ICD-10-CM | POA: Diagnosis not present

## 2019-12-12 MED ORDER — IOHEXOL 300 MG/ML  SOLN
100.0000 mL | Freq: Once | INTRAMUSCULAR | Status: AC | PRN
  Administered 2019-12-12: 100 mL via INTRAVENOUS

## 2019-12-12 NOTE — Telephone Encounter (Addendum)
    Patient requesting return to work note (pick up) Plans to return to work 7/6

## 2019-12-12 NOTE — Telephone Encounter (Signed)
Pharmacist has been informed

## 2019-12-12 NOTE — Telephone Encounter (Signed)
New message:   Pt is calling and would like to know how long it will be before she gets the results back of her test she had at the heart care office today. Please advise.   Test was ordered by Dr. Sharlet Salina

## 2019-12-12 NOTE — Telephone Encounter (Signed)
QT from recent visit acceptable for zofran

## 2019-12-12 NOTE — Telephone Encounter (Signed)
Pt has been informed of results and expressed understanding. She states that she is going to hold off on follow up for now and see how she does. She stated the pain is starting to subside and she was preparing to travel to a home that she has in [facility].

## 2019-12-13 NOTE — Telephone Encounter (Signed)
[place] for note, thanks

## 2019-12-13 NOTE — Telephone Encounter (Signed)
Pt contacted and pt stated that she had the ambulance at her house on Monday due to feeling fatigue and muscle pain from the tops of her shoulder to the bottom of feet. 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   Pt came in on Tuesday and saw Sharlet Salina for above mentioned pain.   Pt has not worked all this week. (12/09/2019).   Okay to write note for patient?

## 2019-12-13 NOTE — Telephone Encounter (Signed)
Pt saw [facility] on 12/10/2019 for epigastric pain.   LVM for pt to call back as soon as possible

## 2019-12-13 NOTE — Telephone Encounter (Signed)
° ° °  Please return call to patient °

## 2019-12-17 NOTE — Telephone Encounter (Signed)
Patient calling for status  Of return to work note, needed today

## 2019-12-23 ENCOUNTER — Telehealth (HOSPITAL_COMMUNITY): Payer: Self-pay | Admitting: *Deleted

## 2019-12-23 NOTE — Telephone Encounter (Signed)
auth for pyp expired. Josem Kaufmann was faxed to the wrong office. Our office was never notified until the day the auth expired. New auth submitted by Philicia Branch,CMA on 6/73. Pt called our office today and left VM to check on auth. I spoke with Philicia and she stated she would contact pts insurance today to see if Josem Kaufmann is approved. I called pt to tell her we are following up on auth and will contact her when Josem Kaufmann is approved. Pt did not answer.

## 2019-12-24 ENCOUNTER — Ambulatory Visit (INDEPENDENT_AMBULATORY_CARE_PROVIDER_SITE_OTHER): Payer: Medicare Other | Admitting: Gastroenterology

## 2019-12-24 ENCOUNTER — Encounter: Payer: Self-pay | Admitting: Gastroenterology

## 2019-12-24 VITALS — BP 126/70 | HR 72 | Ht 63.0 in | Wt 165.1 lb

## 2019-12-24 DIAGNOSIS — K219 Gastro-esophageal reflux disease without esophagitis: Secondary | ICD-10-CM | POA: Diagnosis not present

## 2019-12-24 DIAGNOSIS — Z86010 Personal history of colonic polyps: Secondary | ICD-10-CM

## 2019-12-24 MED ORDER — OMEPRAZOLE 40 MG PO CPDR: 40.0000 mg | DELAYED_RELEASE_CAPSULE | Freq: Two times a day (BID) | ORAL | 3 refills | Status: DC

## 2019-12-24 NOTE — Progress Notes (Signed)
    History of Present Illness: This is a 74 year old female returning for follow-up of GERD and gastritis.  She been treated with Nexium 40 mg twice daily which has been effective in controlling symptoms.  For cost reasons she would like to change to omeprazole.  She relates an episode of leg aches chest back and abdominal aches, severe fatigue and nausea following work of event were she was outdoors for several hours in the heat standing on a hard surface.  Blood work was unremarkable except for potassium of 3.2.  CT AP scan was unremarkable.  Her symptoms resolved after several days and are likely due to heat and muscle strain.  EGD 10/2019 - Z-line variable, 35 cm from the incisors. Biopsied. - Gastritis. Biopsied. - Small hiatal hernia. - Normal duodenal bulb and second portion of the duodenum.  1. Surgical [P], gastric antrum and gastric body - GASTRIC ANTRAL AND OXYNTIC MUCOSA WITH MILD CHRONIC GASTRITIS. - INTESTINAL METAPLASIA IS PRESENT FOCALLY IN ANTRUM, NEGATIVE FOR DYSPLASIA. - WARTHIN-STARRY STAIN IS NEGATIVE FOR HELICOBACTER PYLORI. 2. Surgical [P], esophagus, irregular z line - GASTROESOPHAGEAL JUNCTION MUCOSA WITH REACTIVE/REGENERATIVE CHANGES. - NEGATIVE FOR INTESTINAL METAPLASIA (GOBLET CELL METAPLASIA).   Current Medications, Allergies, Past Medical History, Past Surgical History, Family History and Social History were reviewed in [employer] record.   Physical Exam: General: Well developed, well nourished, no acute distress Head: Normocephalic and atraumatic Eyes:  sclerae anicteric, EOMI Ears: Normal auditory acuity Mouth: Not examined, mask on during Covid-19 pandemic Lungs: Clear throughout to auscultation Heart: Regular rate and rhythm; no murmurs, rubs or bruits Abdomen: Soft, non tender and non distended. No masses, hepatosplenomegaly or hernias noted. Normal Bowel sounds Rectal: Not done Musculoskeletal: Symmetrical with no gross  deformities  Pulses:  Normal pulses noted Extremities: No clubbing, cyanosis, edema or deformities noted Neurological: Alert oriented x 4, grossly nonfocal Psychological:  Alert and cooperative. Normal mood and affect   Assessment and Recommendations:  1.  GERD, gastritis.  Follow antireflux measures long-term.  Per her request change to omeprazole 40 mg twice daily.  If this is not effective in controlling her symptoms she is advised to notify our office otherwise REV in 1 year.  2.  Hypokalemia. Follow up with Cardiology or PCP.   3. Personal history of adenomatous colon polyps.  Consider surveillance colonoscopy in March 2027.

## 2019-12-24 NOTE — Patient Instructions (Signed)
We have sent the following prescriptions to your mail in pharmacy: omeprazole 40 mg twice daily.   If you have not heard from your mail in pharmacy within 1 week or if you have not received your medication in the mail, please contact us at [phone] so we may find out why.  Follow up with your cardiologist for your elevated potassium and medication problems.   Thank you for choosing me and [facility].  Pricilla Riffle. Dagoberto Ligas., MD., Marval Regal

## 2020-01-14 DIAGNOSIS — K219 Gastro-esophageal reflux disease without esophagitis: Secondary | ICD-10-CM | POA: Diagnosis not present

## 2020-01-14 DIAGNOSIS — E042 Nontoxic multinodular goiter: Secondary | ICD-10-CM | POA: Diagnosis not present

## 2020-01-29 ENCOUNTER — Telehealth (HOSPITAL_COMMUNITY): Payer: Self-pay

## 2020-01-29 NOTE — Telephone Encounter (Signed)
Submitted another pre auth for [phone] [phone] case was sent to medical review. Case #:[id number]

## 2020-02-07 ENCOUNTER — Other Ambulatory Visit (HOSPITAL_COMMUNITY): Payer: Self-pay | Admitting: Cardiology

## 2020-02-10 ENCOUNTER — Ambulatory Visit (INDEPENDENT_AMBULATORY_CARE_PROVIDER_SITE_OTHER): Payer: Medicare Other | Admitting: *Deleted

## 2020-02-10 DIAGNOSIS — R55 Syncope and collapse: Secondary | ICD-10-CM | POA: Diagnosis not present

## 2020-02-10 LAB — CUP PACEART REMOTE DEVICE CHECK
Date Time Interrogation Session: 20210830122936
Implantable Lead Implant Date: 20170921
Implantable Lead Implant Date: 20170921
Implantable Lead Location: 753859
Implantable Lead Location: 753860
Implantable Lead Model: 377
Implantable Lead Model: 377
Implantable Lead Serial Number: 49575519
Implantable Lead Serial Number: 49580836
Implantable Pulse Generator Implant Date: 20170921
Pulse Gen Model: 407145
Pulse Gen Serial Number: 68850891

## 2020-02-11 NOTE — Progress Notes (Signed)
Remote pacemaker transmission.   

## 2020-02-14 ENCOUNTER — Telehealth: Payer: Self-pay

## 2020-02-14 ENCOUNTER — Telehealth (HOSPITAL_COMMUNITY): Payer: Self-pay | Admitting: Cardiology

## 2020-02-14 NOTE — Telephone Encounter (Signed)
Returned call to pt. Advised most recent AT episode was on 02/07/20 at 13:04, <2 min duration, reported on 02/10/20 scheduled PPM remote. Pt concerned about chest discomfort at center chest, states "I feel pain in my heart." Notices only at night, some improvement when laying on her back. Reports it is a "solid, even, smooth pain, not throbbing, not stabbing, just an even pain that I can feel." Reports she feels she can't do anything when she is feeling chest discomfort, feels very short of breath. Also concerned about an episode of chest pain a few nights ago that woke her up from sleep, "felt like someone hit me in the chest and I sat straight up in bed." Denies SOB or other associated symptoms with this episode. Reports compliance with cardiac meds, has not tried PRN nitro for chest discomfort. Denies any symptoms at this time.   Pt also concerned that her body is not absorbing the new coated potassium pills she received from [facility]. She plans to discuss this further at her upcoming appointment with Dr. Aundra Dubin on 02/21/20.  Reassured pt that there have been no episodes on PPM to correlate with episodes of chest discomfort. Will route to Dr. Aundra Dubin and Dr. Lovena Le for advisement. ED precautions reviewed for new or worsening symptoms, pt verbalizes understanding and agreement with plan.

## 2020-02-14 NOTE — Telephone Encounter (Signed)
Patient called with multiple concerns Reports she had an episode last night that felt as if someone was punching directly in the chest. Report she already has a message in at the EP clinic. Reports multiple concerns regarding detailed medical history (why was it not explained that she has a history of complete heart block and heart failure, why was her previous episode with loop recorder not addressed as soon as it happened as opposed to 3 days later, etc)  With additional concerns of increased SOB and fatigue would like appt with Dr Aundra Dubin to discuss in person  -appt provided

## 2020-02-14 NOTE — Telephone Encounter (Signed)
The pt states she had an A/T event and wants to know what it is and when did it happened. I told her the nurse will call her back.

## 2020-02-14 NOTE — Telephone Encounter (Signed)
Pt called back to inquire about PYP scan, per [place], [facility] [facility] denied test and the appeal. Advised pt we would discuss at her upcoming appt and could try to reorder and start a new pa at that time, she is agreeable but not happy. She then states she was shocked by her defib in her sleep last night, she has a call into Dr Tanna Furry office to f/u on this. She then goes on to complain about how she had much more in depth and 3D echo's while she was Dr [place] pt and she doesn't understand why her past 2 echos have not been done that way. Advised not sure what type of in depth echo she is ref about, a 2D echo is our standard test and what she has been getting, again advised pt she can discuss these concerns with Dr Aundra Dubin at upcoming appt and call was ended.

## 2020-02-18 ENCOUNTER — Telehealth (HOSPITAL_COMMUNITY): Payer: Self-pay | Admitting: *Deleted

## 2020-02-18 NOTE — Telephone Encounter (Signed)
Pt left VM to cancel appt because she has to attend a funeral 9/10 at 3pm. Pt said she still needs to be seen by Dr.McLean asap. Her original appt was a work in appt and Dr.McLean's next available is 10/26 called pt to inform. No answer an vm is full.

## 2020-02-19 ENCOUNTER — Telehealth: Payer: Self-pay | Admitting: Gastroenterology

## 2020-02-19 NOTE — Telephone Encounter (Signed)
Left message for patient to call back  

## 2020-02-21 ENCOUNTER — Encounter (HOSPITAL_COMMUNITY): Payer: Medicare Other | Admitting: Cardiology

## 2020-02-24 NOTE — Telephone Encounter (Signed)
Patient reports diarrhea since the EGD in May.  She will come in and see Dr. Fuller Plan on 10/5.  She is offered an appt with APP, she declines.  She doesn't understand that if her stomach was "inflamed "at the time of her EGD why she wasn't treated with antibiotics.  She reports diarrhea with her meals.  She would like Dr. Fuller Plan to be aware of what she is experiencing prior to the appt so he "can review my chart and have a plan when I come for the apptt".

## 2020-02-24 NOTE — Telephone Encounter (Signed)
Further evaluate diarrhea at REV. Her gastritis was not infectious (H pylori negative). Omeprazole 40 mg po bid to treat her GERD also treats her gastritis.

## 2020-03-04 DIAGNOSIS — R490 Dysphonia: Secondary | ICD-10-CM | POA: Diagnosis not present

## 2020-03-04 DIAGNOSIS — E042 Nontoxic multinodular goiter: Secondary | ICD-10-CM | POA: Diagnosis not present

## 2020-03-10 DIAGNOSIS — E042 Nontoxic multinodular goiter: Secondary | ICD-10-CM | POA: Diagnosis not present

## 2020-03-17 ENCOUNTER — Encounter: Payer: Self-pay | Admitting: Gastroenterology

## 2020-03-17 ENCOUNTER — Encounter (HOSPITAL_COMMUNITY): Payer: Self-pay

## 2020-03-17 ENCOUNTER — Other Ambulatory Visit (INDEPENDENT_AMBULATORY_CARE_PROVIDER_SITE_OTHER): Payer: Medicare Other

## 2020-03-17 ENCOUNTER — Ambulatory Visit: Payer: Medicare Other | Admitting: Gastroenterology

## 2020-03-17 VITALS — BP 142/70 | HR 76 | Ht 63.0 in | Wt 166.4 lb

## 2020-03-17 DIAGNOSIS — R1030 Lower abdominal pain, unspecified: Secondary | ICD-10-CM

## 2020-03-17 DIAGNOSIS — R197 Diarrhea, unspecified: Secondary | ICD-10-CM | POA: Diagnosis not present

## 2020-03-17 LAB — COMPREHENSIVE METABOLIC PANEL
ALT: 14 U/L (ref 0–35)
AST: 15 U/L (ref 0–37)
Albumin: 4.4 g/dL (ref 3.5–5.2)
Alkaline Phosphatase: 140 U/L — ABNORMAL HIGH (ref 39–117)
BUN: 10 mg/dL (ref 6–23)
CO2: 25 mEq/L (ref 19–32)
Calcium: 9.7 mg/dL (ref 8.4–10.5)
Chloride: 105 mEq/L (ref 96–112)
Creatinine, Ser: 0.79 mg/dL (ref 0.40–1.20)
GFR: 73.44 mL/min (ref >60.00)
Glucose, Bld: 108 mg/dL — ABNORMAL HIGH (ref 70–99)
Potassium: 3.6 mEq/L (ref 3.5–5.1)
Sodium: 139 mEq/L (ref 135–145)
Total Bilirubin: 0.6 mg/dL (ref 0.2–1.2)
Total Protein: 7.2 g/dL (ref 6.0–8.3)

## 2020-03-17 LAB — CBC
HCT: 38.5 % (ref 36.0–46.0)
Hemoglobin: 12.7 g/dL (ref 12.0–15.0)
MCHC: 33.1 g/dL (ref 30.0–36.0)
MCV: 80.4 fl (ref 78.0–100.0)
Platelets: 251 10*3/uL (ref 150.0–400.0)
RBC: 4.79 Mil/uL (ref 3.87–5.11)
RDW: 13.9 % (ref 11.5–15.5)
WBC: 6.2 10*3/uL (ref 4.0–10.5)

## 2020-03-17 LAB — TSH: TSH: 1.39 u[IU]/mL (ref 0.35–4.50)

## 2020-03-17 MED ORDER — DICYCLOMINE HCL 10 MG PO CAPS
10.0000 mg | ORAL_CAPSULE | Freq: Three times a day (TID) | ORAL | 0 refills | Status: DC
Start: 2020-03-17 — End: 2020-05-22

## 2020-03-17 MED ORDER — DICYCLOMINE HCL 10 MG PO CAPS: 10.0000 mg | ORAL_CAPSULE | Freq: Three times a day (TID) | ORAL | 1 refills | Status: DC

## 2020-03-17 NOTE — Progress Notes (Signed)
    History of Present Illness: This is a 74 year old female who relates the onset of urgent, watery, postprandial diarrhea occasionally with blood and mucus or darker stools. She relates crampy lower abdominal pain. Symptoms started in May following her EGD. She denies any recent antibiotic use or travel history. No recent change in medications or diet. She relates the symptoms have been constant and persistent since May occurring on a daily basis. CT AP from July as below.  CT AP 12/12/2019 No acute findings or other significant abnormality within the abdomen or pelvis.  Colonoscopy 08/2018 - Internal, external hemorrhoids - Cecal polyp, sessile, removed by cold snare. (tubluar adenoma) - Mild left colon diverticulosis. - Otherwise normal appearing colonoscopy.  Current Medications, Allergies, Past Medical History, Past Surgical History, Family History and Social History were reviewed in [employer] record.   Physical Exam: General: Well developed, well nourished, no acute distress Head: Normocephalic and atraumatic Eyes:  sclerae anicteric, EOMI Ears: Normal auditory acuity Mouth: Not examined, mask on during Covid-19 pandemic Lungs: Clear throughout to auscultation Heart: Regular rate and rhythm; no murmurs, rubs or bruits Abdomen: Soft, mild lower abdominal tenderness and non distended. No masses, hepatosplenomegaly or hernias noted. Normal Bowel sounds Rectal: External hemorrhoidal tags no internal lesions Hemoccult negative brown stool in the vault Musculoskeletal: Symmetrical with no gross deformities  Pulses:  Normal pulses noted Extremities: No clubbing, cyanosis, edema or deformities noted Neurological: Alert oriented x 4, grossly nonfocal Psychological:  Alert and cooperative. Normal mood and affect   Assessment and Recommendations:  1. Diarrhea, urgency, lower abdominal pain, rectal bleeding. Suspect rectal bleeding is related to known hemorrhoids.  Rule out infectious and inflammatory disorders. CBC, CMP, TSH, IgA, TTG and GI stool profile. If no cause is identified schedule colonoscopy for further evaluation. Begin dicyclomine 10 mg 3 times daily AC. Preparation H suppositories PR daily as needed hemorrhoidal symptoms.  2. Gastritis. Addressed her concern about an infectious gastritis. H. pylori was negative. She was reassured that she did not have an infectious gastritis. Continue omeprazole 40 mg po bid. she takes Naproxen very infrequently

## 2020-03-17 NOTE — Patient Instructions (Addendum)
Your provider has requested that you go to the basement level for lab work before leaving today. Press "B" on the elevator. The lab is located at the first door on the left as you exit the elevator.  We have sent the following medications to your pharmacy for you to pick up at your convenience: Dicyclomine 30 days supply sent to local [facility]/ 90 day supply sent to [facility] order.   Due to recent changes in healthcare laws, you may see the results of your imaging and laboratory studies on MyChart before your provider has had a chance to review them.  We understand that in some cases there may be results that are confusing or concerning to you. Not all laboratory results come back in the same time frame and the provider may be waiting for multiple results in order to interpret others.  Please give [place] 48 hours in order for your provider to thoroughly review all the results before contacting the office for clarification of your results.    Thank you for choosing me and [facility].  Pricilla Riffle. Dagoberto Ligas., MD., Marval Regal

## 2020-03-18 ENCOUNTER — Other Ambulatory Visit: Payer: Self-pay

## 2020-03-18 DIAGNOSIS — R1030 Lower abdominal pain, unspecified: Secondary | ICD-10-CM

## 2020-03-18 DIAGNOSIS — R197 Diarrhea, unspecified: Secondary | ICD-10-CM

## 2020-03-18 LAB — TISSUE TRANSGLUTAMINASE, IGA: (tTG) Ab, IgA: <1 U/mL

## 2020-03-24 ENCOUNTER — Encounter (HOSPITAL_COMMUNITY): Payer: Self-pay

## 2020-03-24 ENCOUNTER — Telehealth (HOSPITAL_COMMUNITY): Payer: Self-pay

## 2020-03-24 NOTE — Telephone Encounter (Signed)
PYP scan has been denied several times by patients insurance although office notes have been faxed. FYI

## 2020-03-31 ENCOUNTER — Emergency Department (HOSPITAL_COMMUNITY): Payer: Medicare Other

## 2020-03-31 ENCOUNTER — Telehealth (HOSPITAL_COMMUNITY): Payer: Self-pay | Admitting: *Deleted

## 2020-03-31 ENCOUNTER — Encounter (HOSPITAL_COMMUNITY): Payer: Self-pay | Admitting: Emergency Medicine

## 2020-03-31 ENCOUNTER — Emergency Department (HOSPITAL_COMMUNITY)
Admission: EM | Admit: 2020-03-31 | Discharge: 2020-03-31 | Disposition: A | Discharge status: Home / Self Care | Payer: Medicare Other | Attending: Emergency Medicine | Admitting: Emergency Medicine

## 2020-03-31 ENCOUNTER — Telehealth: Payer: Self-pay | Admitting: Internal Medicine

## 2020-03-31 ENCOUNTER — Other Ambulatory Visit: Payer: Self-pay

## 2020-03-31 DIAGNOSIS — Z96642 Presence of left artificial hip joint: Secondary | ICD-10-CM | POA: Diagnosis not present

## 2020-03-31 DIAGNOSIS — E1165 Type 2 diabetes mellitus with hyperglycemia: Secondary | ICD-10-CM | POA: Insufficient documentation

## 2020-03-31 DIAGNOSIS — Z79899 Other long term (current) drug therapy: Secondary | ICD-10-CM | POA: Insufficient documentation

## 2020-03-31 DIAGNOSIS — G319 Degenerative disease of nervous system, unspecified: Secondary | ICD-10-CM | POA: Diagnosis not present

## 2020-03-31 DIAGNOSIS — R55 Syncope and collapse: Secondary | ICD-10-CM | POA: Diagnosis not present

## 2020-03-31 DIAGNOSIS — Z955 Presence of coronary angioplasty implant and graft: Secondary | ICD-10-CM | POA: Diagnosis not present

## 2020-03-31 DIAGNOSIS — R42 Dizziness and giddiness: Secondary | ICD-10-CM | POA: Diagnosis not present

## 2020-03-31 DIAGNOSIS — Z8541 Personal history of malignant neoplasm of cervix uteri: Secondary | ICD-10-CM | POA: Diagnosis not present

## 2020-03-31 DIAGNOSIS — Z950 Presence of cardiac pacemaker: Secondary | ICD-10-CM | POA: Diagnosis not present

## 2020-03-31 DIAGNOSIS — I10 Essential (primary) hypertension: Secondary | ICD-10-CM | POA: Diagnosis not present

## 2020-03-31 DIAGNOSIS — R410 Disorientation, unspecified: Secondary | ICD-10-CM | POA: Diagnosis present

## 2020-03-31 DIAGNOSIS — R5383 Other fatigue: Secondary | ICD-10-CM | POA: Diagnosis not present

## 2020-03-31 DIAGNOSIS — Z96652 Presence of left artificial knee joint: Secondary | ICD-10-CM | POA: Diagnosis not present

## 2020-03-31 DIAGNOSIS — J90 Pleural effusion, not elsewhere classified: Secondary | ICD-10-CM | POA: Diagnosis not present

## 2020-03-31 DIAGNOSIS — R6889 Other general symptoms and signs: Secondary | ICD-10-CM | POA: Diagnosis not present

## 2020-03-31 DIAGNOSIS — E162 Hypoglycemia, unspecified: Secondary | ICD-10-CM | POA: Diagnosis not present

## 2020-03-31 DIAGNOSIS — R9082 White matter disease, unspecified: Secondary | ICD-10-CM | POA: Diagnosis not present

## 2020-03-31 DIAGNOSIS — Z7982 Long term (current) use of aspirin: Secondary | ICD-10-CM | POA: Insufficient documentation

## 2020-03-31 DIAGNOSIS — I6782 Cerebral ischemia: Secondary | ICD-10-CM | POA: Diagnosis not present

## 2020-03-31 DIAGNOSIS — Z20822 Contact with and (suspected) exposure to covid-19: Secondary | ICD-10-CM | POA: Diagnosis not present

## 2020-03-31 DIAGNOSIS — R52 Pain, unspecified: Secondary | ICD-10-CM | POA: Diagnosis not present

## 2020-03-31 DIAGNOSIS — R9431 Abnormal electrocardiogram [ECG] [EKG]: Secondary | ICD-10-CM | POA: Diagnosis not present

## 2020-03-31 DIAGNOSIS — Z743 Need for continuous supervision: Secondary | ICD-10-CM | POA: Diagnosis not present

## 2020-03-31 DIAGNOSIS — E161 Other hypoglycemia: Secondary | ICD-10-CM | POA: Diagnosis not present

## 2020-03-31 LAB — URINALYSIS, ROUTINE W REFLEX MICROSCOPIC
Bilirubin Urine: NEGATIVE
Glucose, UA: NEGATIVE mg/dL
Hgb urine dipstick: NEGATIVE
Ketones, ur: NEGATIVE mg/dL
Nitrite: NEGATIVE
Protein, ur: NEGATIVE mg/dL
Specific Gravity, Urine: 1.016 (ref 1.005–1.030)
pH: 6 (ref 5.0–8.0)

## 2020-03-31 LAB — PROTIME-INR
INR: 0.9 (ref 0.8–1.2)
Prothrombin Time: 12.2 seconds (ref 11.4–15.2)

## 2020-03-31 LAB — RAPID URINE DRUG SCREEN, HOSP PERFORMED
Amphetamines: NOT DETECTED
Barbiturates: NOT DETECTED
Benzodiazepines: POSITIVE — AB
Cocaine: NOT DETECTED
Opiates: NOT DETECTED
Tetrahydrocannabinol: NOT DETECTED

## 2020-03-31 LAB — CBC
HCT: 42.6 % (ref 36.0–46.0)
Hemoglobin: 13.4 g/dL (ref 12.0–15.0)
MCH: 25.6 pg — ABNORMAL LOW (ref 26.0–34.0)
MCHC: 31.5 g/dL (ref 30.0–36.0)
MCV: 81.3 fL (ref 80.0–100.0)
Platelets: 276 10*3/uL (ref 150–400)
RBC: 5.24 MIL/uL — ABNORMAL HIGH (ref 3.87–5.11)
RDW: 13.1 % (ref 11.5–15.5)
WBC: 6 10*3/uL (ref 4.0–10.5)
nRBC: 0 % (ref 0.0–0.2)

## 2020-03-31 LAB — COMPREHENSIVE METABOLIC PANEL
ALT: 14 U/L (ref 0–44)
AST: 16 U/L (ref 15–41)
Albumin: 4.1 g/dL (ref 3.5–5.0)
Alkaline Phosphatase: 135 U/L — ABNORMAL HIGH (ref 38–126)
Anion gap: 9 (ref 5–15)
BUN: 10 mg/dL (ref 8–23)
CO2: 26 mmol/L (ref 22–32)
Calcium: 9.7 mg/dL (ref 8.9–10.3)
Chloride: 103 mmol/L (ref 98–111)
Creatinine, Ser: 0.81 mg/dL (ref 0.44–1.00)
GFR, Estimated: >60 mL/min (ref >60)
Glucose, Bld: 85 mg/dL (ref 70–99)
Potassium: 3.5 mmol/L (ref 3.5–5.1)
Sodium: 138 mmol/L (ref 135–145)
Total Bilirubin: 1.2 mg/dL (ref 0.3–1.2)
Total Protein: 7 g/dL (ref 6.5–8.1)

## 2020-03-31 LAB — DIFFERENTIAL
Abs Immature Granulocytes: 0.02 10*3/uL (ref 0.00–0.07)
Basophils Absolute: 0 10*3/uL (ref 0.0–0.1)
Basophils Relative: 1 %
Eosinophils Absolute: 0.1 10*3/uL (ref 0.0–0.5)
Eosinophils Relative: 2 %
Immature Granulocytes: 0 %
Lymphocytes Relative: 42 %
Lymphs Abs: 2.5 10*3/uL (ref 0.7–4.0)
Monocytes Absolute: 0.3 10*3/uL (ref 0.1–1.0)
Monocytes Relative: 5 %
Neutro Abs: 3 10*3/uL (ref 1.7–7.7)
Neutrophils Relative %: 50 %

## 2020-03-31 LAB — ETHANOL: Alcohol, Ethyl (B): <10 mg/dL (ref <10)

## 2020-03-31 LAB — TROPONIN I (HIGH SENSITIVITY): Troponin I (High Sensitivity): 6 ng/L (ref <18)

## 2020-03-31 LAB — APTT: aPTT: 26 seconds (ref 24–36)

## 2020-03-31 MED ORDER — SODIUM CHLORIDE 0.9% FLUSH: 3.0000 mL | Freq: Once | INTRAVENOUS | Status: DC

## 2020-03-31 NOTE — Discharge Instructions (Addendum)
At this time there does not appear to be the presence of an emergent medical condition, however there is always the potential for conditions to change. Please read and follow the below instructions.  Please return to the Emergency Department immediately for any new or worsening symptoms. Please be sure to follow up with your Primary Care Provider within one week regarding your visit today; please call their office to schedule an appointment even if you are feeling better for a follow-up visit.  Go to the nearest Emergency Department immediately if: You have fever or chills You throw up (vomit) or have watery poop (diarrhea), and you cannot eat or drink anything. You have trouble: Talking. Walking. Swallowing. Using your arms, hands, or legs. You feel generally weak. You are not thinking clearly, or you have trouble forming sentences. A friend or family member may notice this. You have: Chest pain. Pain in your belly (abdomen). Shortness of breath. Sweating. Your vision changes. You are bleeding. You have a very bad headache. You have neck pain or a stiff neck. You have a fever. You have any new/concerning or worsening of symptoms   Please read the additional information packets attached to your discharge summary.  Do not take your medicine if  develop an itchy rash, swelling in your mouth or lips, or difficulty breathing; call 911 and seek immediate emergency medical attention if this occurs.  You may review your lab tests and imaging results in their entirety on your MyChart account.  Please discuss all results of fully with your primary care provider and other specialist at your follow-up visit.  Note: Portions of this text may have been transcribed using voice recognition software. Every effort was made to ensure accuracy; however, inadvertent computerized transcription errors may still be present.

## 2020-03-31 NOTE — ED Triage Notes (Signed)
Pt arrives to ED via [facility] with complaint of episodes of confusion and dizziness that last up to 4 hours for the last two weeks. Pt states again today she got confused and disoriented and went to UC. Denies CP and SOB. Alert & Oriented x4 in triage.

## 2020-03-31 NOTE — ED Provider Notes (Signed)
[facility] EMERGENCY DEPARTMENT Provider Note   CSN: [id number] Arrival date & time: 03/31/20  1336     History Chief Complaint  Patient presents with   Altered Mental Status    Renee [name] is a 74 y.o. female history of pacemaker for complete heart block, type II diet-controlled diabetes, GERD, hypertension, fibromyalgia.  Patient presents today for feeling strange over the past 3 weeks. She reports an intermittent feeling of disorientation and "not feeling like myself" this is associated with feeling like she is going to pass out. She reports sometimes this lasts only a few minutes and sometimes it can last up to 4 hours. She cannot identify any inciting factors, sometimes she is sitting at her office chair when it begins. She denies similar in the past. When it happened today she checked her blood sugar and noted her glucose to be 100 she then became concerned for a cardiac cause and called EMS. Patient reports that when these sensations occurred she feels generally weak.  She denies vision changes, confusion, unilateral weakness, difficulty speaking, paresthesias, recent illness, fever/chills, neck stiffness, headache, chest pain/shortness of breath, abdominal pain, nausea/vomiting, diarrhea, extremity swelling/color change or any additional concerns.  HPI     Past Medical History:  Diagnosis Date   Allergy    tape, electrodes   Anxiety    AV nodal re-entry tachycardia [facility])    a. s/p RFA 1993- Dr. Rolland Porter, [facility]   Broken neck [facility])    1993   Cancer [facility])    hx of cervical   ca   Chronic chest pain    a. 2005 Cath: Mild nonobstructive plaque (30-40% LCX);  b. 08/2011 low risk myoview;  c. 09/2011 Coronary CT: NL cors w ca score of 0.   Chronic pain syndrome    Complication of anesthesia    blood pressure and heart dropped in 2009     Concussion    1979, 1993   Degenerative joint disease    a. s/p L Total Knee Arthroplasty.    Diet-controlled type 2 diabetes mellitus ([place])    pt. denies   Diverticulosis of colon    Duodenitis without hemorrhage    Dysrhythmia    REPORTS , SINCE PLACEMENT OF PACEMAKER, STILL CAN FEEL HEART " GOING IN AND OUT OF [facility]" REPORTS, YESTERDAY HER BP SYSTOLIC WAS IN THE 166A AND WAS BREATHLESS, HR 112, SWEATING ; DENIES SYNCOPE  , DENIES CHEST PAIN    Fibromyalgia    GERD (gastroesophageal reflux disease)    Hemorrhoids    Hypertension    Irritable bowel syndrome    Long QT interval    a. mild - advised to avoid meds that may prolong QT.   Myocardial infarction [facility]) 09/1991   does not know when second heart attack was   Neuropathy    Osteoporosis    Paroxysmal A-fib (HCC)    PER PATIENT. I CAN FEEL MY HEART RACING WHEN IM IN IT    Recurrent upper respiratory infection (URI)    SVT (supraventricular tachycardia) (HCC)    a. nonsustained SVT - previously offered flecainide but refused;  b. 12/2012 30 day event monitor w/o significant arrhythmias.   Thyroid nodule     Patient Active Problem List   Diagnosis Date Noted   Epigastric pain 12/10/2019   Atypical chest pain 12/10/2019   PVC's (premature ventricular contractions) 08/06/2019   Sinus node dysfunction (HCC) 03/11/2019   Chronic cough 03/05/2019   Urinary retention 03/05/2019  Generalized weakness 09/11/2018   Dysuria 05/09/2018   Acute sinus infection 04/20/2018   Other allergic rhinitis 04/20/2018   Cystocele, midline 08/01/2017   Hyperglycemia 01/31/2017   Bilateral foot pain 01/31/2017   Laryngopharyngeal reflux (LPR) 08/18/2016   Cough 06/24/2016   Syncope 03/03/2016   Pacemaker 03/03/2016   Chest pain at rest 09/13/2015   Spinal stenosis of lumbar region 09/07/2015   Raynaud phenomenon 09/07/2015   Insomnia 09/07/2015   Encounter for well adult exam with abnormal findings 12/18/2014   Paroxysmal spells 01/28/2014   Dysthymia 01/28/2014   Injury of leg 10/07/2013    Headache(784.0) 03/14/2013   Eye problem 02/15/2013   Persistent headaches 02/15/2013   Low blood sugar 05/31/2012   Pre-syncope 10/04/2011   NSVT (nonsustained ventricular tachycardia) ([place]) 10/04/2011   Dyspnea 10/01/2009   Diverticulosis of large intestine 09/24/2009   SWEATING 09/24/2009   FATIQUE AND MALAISE 09/16/2009   LONG QT SYNDROME 03/19/2009   AV NODAL REENTRY TACHYCARDIA 10/03/2008   Other dysphagia 06/17/2008   HEMORRHOIDS 06/16/2008   DUODENITIS, WITHOUT HEMORRHAGE 06/16/2008   CONSTIPATION, CHRONIC 06/16/2008   THYROID NODULE, right lobe. 05/26/2008   Chronic pain syndrome 05/26/2008   Coronary atherosclerosis 05/26/2008   BRONCHITIS, RECURRENT 05/26/2008   Irritable bowel syndrome 05/26/2008   FECAL INCONTINENCE 05/26/2008   Anxiety state 03/24/2008   Essential hypertension 03/24/2008   GERD 03/24/2008   DEGENERATIVE JOINT DISEASE 03/24/2008   BACK PAIN, LUMBAR 03/24/2008   Fibromyalgia 03/24/2008   Chest pain 03/24/2008   NEUROPATHY, HX OF 03/24/2008    Past Surgical History:  Procedure Laterality Date   ABLATION OF DYSRHYTHMIC FOCUS  1993   ANTERIOR AND POSTERIOR REPAIR N/A 08/01/2017   Procedure: ANTERIOR (CYSTOCELE) AND POSTERIOR REPAIR (RECTOCELE);  Surgeon: Bjorn Loser, MD;  Location: [facility];  Service: Urology;  Laterality: N/A;   APPENDECTOMY     1980   BILATERAL SALPINGECTOMY Left 08/01/2017   Procedure: SALPINGECTOMY;  Surgeon: Bobbye Charleston, MD;  Location: [facility];  Service: Gynecology;  Laterality: Left;   BIOPSY THYROID     CARDIAC ELECTROPHYSIOLOGY STUDY AND ABLATION     atrioventricular nodal reentant tachycardia   CHOLECYSTECTOMY  1980   COLONOSCOPY  06/25/2008   Diverticulosis and Hemorrhoids   COLPOSCOPY     CORONARY ANGIOPLASTY  2007   CYSTOSCOPY N/A 08/01/2017   Procedure: CYSTOSCOPY;  Surgeon: Bjorn Loser, MD;  Location: [facility];  Service: Urology;  Laterality: N/A;   EP  IMPLANTABLE DEVICE N/A 03/03/2016   Procedure: Pacemaker Implant;  Surgeon: Evans Lance, MD;  Location: [facility];  Service: Cardiovascular;  Laterality: N/A;   ESOPHAGOGASTRODUODENOSCOPY     HYSTEROSCOPY  2002   with resection of endometrial polyps. by Dr.MCPhail   HYSTEROSCOPY WITH D & C N/A 02/05/2015   Procedure: DILATATION AND CURETTAGE /HYSTEROSCOPY;  Surgeon: Vanessa Kick, MD;  Location: [facility];  Service: Gynecology;  Laterality: N/A;   LOOP RECORDER IMPLANT  08/21/2013   MDT LinQ implanted by Dr Lovena Le for syncope   LOOP RECORDER IMPLANT N/A 08/21/2013   Procedure: LOOP RECORDER IMPLANT;  Surgeon: Evans Lance, MD;  Location: [facility] CATH LAB;  Service: Cardiovascular;  Laterality: N/A;   ORIF RADIAL HEAD / NECK FRACTURE  09/05/88   TILT TABLE STUDY N/A 05/15/2013   Procedure: TILT TABLE STUDY;  Surgeon: Deboraha Sprang, MD;  Location: [facility] CATH LAB;  Service: Cardiovascular;  Laterality: N/A;   TOTAL HIP ARTHROPLASTY  10/10   Left, at [facility]  UPPER GASTROINTESTINAL ENDOSCOPY     VAGINAL HYSTERECTOMY N/A 08/01/2017   Procedure: HYSTERECTOMY VAGINAL;  Surgeon: Carrington Clamp, MD;  Location: [facility];  Service: Gynecology;  Laterality: N/A;     OB History    Gravida  2   Para  2   Term      Preterm      AB      Living  2     SAB      TAB      Ectopic      Multiple      Live Births              Family History  Problem Relation Age of Onset   Ovarian cancer Mother    Stroke Mother    Diabetes Mother    Asthma Mother    Lung cancer Father        Heart problems   Heart disease Sister    Diabetes Brother    Diabetes Sister        Pacemaker, CHF   Heart disease Sister    Asthma Sister    Colon cancer Neg Hx    Esophageal cancer Neg Hx    Stomach cancer Neg Hx    Rectal cancer Neg Hx     Social History   Tobacco Use   Smoking status: Never Smoker   Smokeless tobacco: Never Used  Vaping Use   Vaping Use:  Never used  Substance Use Topics   Alcohol use: Yes    Comment: Occasional    Drug use: No    Home Medications Prior to Admission medications   Medication Sig Start Date End Date Taking? Authorizing Provider  ACCU-CHEK FASTCLIX LANCETS MISC USE UP TO 4 TIMES DAILY AS  DIRECTED 04/11/18   Corwin Levins, MD  ACCU-CHEK GUIDE test strip USE UP TO 4 TIMES DAILY AS  DIRECTED 04/11/18   Corwin Levins, MD  ALPRAZolam Prudy Feeler) 0.5 MG tablet Take 0.5-1 tablets (0.25-0.5 mg total) by mouth 2 (two) times daily as needed for anxiety. 10/02/19   Corwin Levins, MD  aspirin EC 81 MG tablet Take 81 mg by mouth daily.    [provider]  blood glucose meter kit and supplies KIT Dispense per patient and insurance preferred. Use up to four times daily as directed. E11.9 02/21/18   Corwin Levins, MD  cetirizine (ZYRTEC) 10 MG tablet Take 10 mg by mouth daily.    [provider]  dicyclomine (BENTYL) 10 MG capsule Take 1 capsule (10 mg total) by mouth 4 (four) times daily -  before meals and at bedtime. 03/17/20   Meryl Dare, MD  diltiazem (CARDIZEM CD) 240 MG 24 hr capsule TAKE 1 CAPSULE BY MOUTH IN  THE MORNING 02/10/20   Laurey Morale, MD  diltiazem (CARDIZEM) 30 MG tablet Take 2 tablets (60 mg total) by mouth 4 (four) times daily as needed (as needed for breakthrough heart racing/palpitations). 09/11/19   Marinus Maw, MD  flecainide (TAMBOCOR) 50 MG tablet Take 1 tablet (50 mg total) by mouth 2 (two) times daily. 04/16/19   Marinus Maw, MD  furosemide (LASIX) 20 MG tablet Take 2 tablets by mouth for three days.  Then reduce to one tablet by mouth daily. 09/13/19   Marinus Maw, MD  naproxen sodium (ALEVE) 220 MG tablet Take 220 mg by mouth as needed.    [provider]  nitroGLYCERIN (NITROSTAT) 0.4 MG SL tablet  Place 1 tablet (0.4 mg total) under the tongue every 5 (five) minutes as needed for chest pain. Patient not taking: Reported on 03/17/2020 02/03/15   Biagio Borg,  MD  omeprazole (PRILOSEC) 40 MG capsule Take 1 capsule (40 mg total) by mouth in the morning and at bedtime. 12/24/19   Ladene Artist, MD  potassium chloride (KLOR-CON) 10 MEQ tablet Take 3 tablets (30 mEq total) by mouth daily. 09/27/19   Larey Dresser, MD  zolpidem (AMBIEN) 5 MG tablet TAKE 1 TABLET BY MOUTH AT  BEDTIME AS NEEDED FOR SLEEP 08/01/19   Biagio Borg, MD    Allergies    Cyclobenzaprine, Azithromycin, Nitrofurantoin, Adhesive [tape], Moxifloxacin, and Quinolones  Review of Systems   Review of Systems Ten systems are reviewed and are negative for acute change except as noted in the HPI  Physical Exam Updated Vital Signs BP (!) 147/80    Pulse 73    Temp 98.2 F (36.8 C) (Oral)    Resp 15    Ht $R'5\' 3"'ai$  (1.6 m)    Wt 72.6 kg    SpO2 94%    BMI 28.34 kg/m   Physical Exam Constitutional:      General: She is not in acute distress.    Appearance: Normal appearance. She is well-developed. She is not ill-appearing or diaphoretic.  HENT:     Head: Normocephalic and atraumatic.  Eyes:     General: Vision grossly intact. Gaze aligned appropriately.     Pupils: Pupils are equal, round, and reactive to light.  Neck:     Trachea: Trachea and phonation normal.  Pulmonary:     Effort: Pulmonary effort is normal. No respiratory distress.  Abdominal:     General: There is no distension.     Palpations: Abdomen is soft.     Tenderness: There is no abdominal tenderness. There is no guarding or rebound.  Musculoskeletal:        General: Normal range of motion.     Cervical back: Normal range of motion.  Skin:    General: Skin is warm and dry.  Neurological:     Mental Status: She is alert.     GCS: GCS eye subscore is 4. GCS verbal subscore is 5. GCS motor subscore is 6.     Comments: Speech is clear and goal oriented, follows commands Major Cranial nerves without deficit, no facial droop Moves extremities without ataxia, coordination intact  Psychiatric:        Behavior:  Behavior normal.     ED Results / Procedures / Treatments   Labs (all labs ordered are listed, but only abnormal results are displayed) Labs Reviewed  CBC - Abnormal; Notable for the following components:      Result Value   RBC 5.24 (*)    MCH 25.6 (*)    All other components within normal limits  COMPREHENSIVE METABOLIC PANEL - Abnormal; Notable for the following components:   Alkaline Phosphatase 135 (*)    All other components within normal limits  URINALYSIS, ROUTINE W REFLEX MICROSCOPIC - Abnormal; Notable for the following components:   APPearance HAZY (*)    Leukocytes,Ua MODERATE (*)    Bacteria, UA FEW (*)    All other components within normal limits  PROTIME-INR  APTT  DIFFERENTIAL  ETHANOL  RAPID URINE DRUG SCREEN, HOSP PERFORMED  CBG MONITORING, ED  TROPONIN I (HIGH SENSITIVITY)    EKG EKG Interpretation  Date/Time:  Tuesday March 31 2020 13:37:47  EDT Ventricular Rate:  64 PR Interval:  208 QRS Duration: 86 QT Interval:  468 QTC Calculation: 482 R Axis:   37 Text Interpretation: Atrial-paced rhythm Possible Inferior infarct , age undetermined ST & T wave abnormality, consider anterior ischemia Abnormal ECG since last tracing no significant change Confirmed by Daleen Bo [phone]) on 03/31/2020 9:28:03 PM   Radiology CT HEAD WO CONTRAST  Result Date: 03/31/2020 CLINICAL DATA:  Mental status change. EXAM: CT HEAD WITHOUT CONTRAST TECHNIQUE: Contiguous axial images were obtained from the base of the skull through the vertex without intravenous contrast. COMPARISON:  03/30/2014 FINDINGS: Brain: Mild atrophy with progression. Patchy white matter hypodensity bilaterally with progression. Negative for acute infarct, hemorrhage, mass. Vascular: Negative for hyperdense vessel Skull: Negative Sinuses/Orbits: Paranasal sinuses clear.  Negative orbit. Other: None IMPRESSION: No acute abnormality Progression of mild atrophy and chronic microvascular ischemic change  in the white matter since 2015. Electronically Signed   By: Franchot Gallo M.D.   On: 03/31/2020 15:02   DG Chest Portable 1 View  Result Date: 03/31/2020 CLINICAL DATA:  Near syncope EXAM: PORTABLE CHEST 1 VIEW COMPARISON:  12/10/2019 FINDINGS: Left-sided pacing device as before. No focal opacity or pleural effusion. Stable cardiomediastinal silhouette with aortic atherosclerosis. No pneumothorax. IMPRESSION: No active disease. Electronically Signed   By: Donavan Foil M.D.   On: 03/31/2020 20:05    Procedures Procedures (including critical care time)  Medications Ordered in ED Medications  sodium chloride flush (NS) 0.9 % injection 3 mL (3 mLs Intravenous Not Given 03/31/20 1901)    ED Course  I have reviewed the triage vital signs and the nursing notes.  Pertinent labs & imaging results that were available during my care of the patient were reviewed by me and considered in my medical decision making (see chart for details).  Clinical Course as of Apr 01 2131  Tue Mar 31, 2020  2126 [employer] Pacemaker Report Status: No Anomalies Device Status: OK Battery Status OK Findings: No anomalies   [BM]    Clinical Course User Index [BM] Gari Crown   MDM Rules/Calculators/A&P                         Additional history obtained from: 1. Nursing notes from this visit. 2. Review of electronic medical record. It appears patient was at an urgent care today had a negative Covid test, no note filed at this time. --------------------------- 74 year old female history as above presents with intermittent symptoms of what sounds to be presyncope, fatigue and dizziness for the past 3 weeks. In triage stroke labs and CT head were ordered. I have reviewed those results below.  CMP shows no emergent electrolyte derangement, AKI, anion gap elevation or emergent LFT elevations. PT INR and APTT are within normal limits. CBC without anemia, no leukocytosis to suggest infection.  CT  Head:  IMPRESSION:  No acute abnormality    Progression of mild atrophy and chronic microvascular ischemic  change in the white matter since 2015.  ---- ---- On initial evaluation patient is well-appearing no acute distress. Cranial nerves intact, no meningeal signs, cardiopulmonary examination unremarkable, abdomen soft nontender, neurovascular tact all 4 extremities no evidence of DVT, strong equal extremity movements. Patient's symptoms are not consistent with CVA she has no unilateral weakness, vision changes, aphasia. I have added chest x-ray, troponin, CBG, urinalysis, UDS ethanol and have asked nursing staff to interrogate patient's pacemaker. - High-sensitivity troponin within normal limits, no indication for  delta troponin. Ethanol negative, patient does not appear intoxicated or in withdrawal. Biotronik pacemaker report shows no anomalies device status okay. EKG reviewed by Dr. Eulis Foster shows no emergent findings. Orthostatics obtained and are negative. Urinalysis appears contaminated, patient without urinary symptoms doubt UTI. Chest x-ray:  IMPRESSION:  No active disease.  - Work-up today is inconsistent with anemia, arrhythmia, hypoglycemia, AAA/dissection, PE, SAH, CVA or other emergent pathologies.  Possible vasovagal or anxiety.  No evidence for life-threatening cause at this time.  Patient seen and evaluated by Dr. Eulis Foster, there is no indication for further testing, treatment or admission at this time.  Patient will need to follow-up with her PCP and other specialist as outpatient.  At this time there does not appear to be any evidence of an acute emergency medical condition and the patient appears stable for discharge with appropriate outpatient follow up. Diagnosis was discussed with patient who verbalizes understanding of care plan and is agreeable to discharge. I have discussed return precautions with patient who verbalizes understanding. Patient encouraged to follow-up with  their PCP. All questions answered.   Note: Portions of this report may have been transcribed using voice recognition software. Every effort was made to ensure accuracy; however, inadvertent computerized transcription errors may still be present. Final Clinical Impression(s) / ED Diagnoses Final diagnoses:  Light headedness    Rx / DC Orders ED Discharge Orders    None       Gari Crown 03/31/20 2133    Daleen Bo, MD 03/31/20 [phone]

## 2020-03-31 NOTE — Telephone Encounter (Signed)
Pt left vm stating she was at [facility] on [address] Because she is sick. I called back to get more information. No answer.

## 2020-03-31 NOTE — Telephone Encounter (Signed)
[facility] Report : Office is transferring pt who is lightheaded and dizzy and says she feels like she is drunk. She has BS 100 and pt has a pacemaker but does not mention heartrate. Disconnected while holding but office provided zip code and callback #. Caller did not hear end scripting  Triage nurse attempted to reach patient x 3 with no message left.   Patient is currently at the ED.

## 2020-03-31 NOTE — ED Provider Notes (Signed)
  Face-to-face evaluation   History: She presents for evaluation of dizziness for couple of weeks.  She is concerned about her heart failure which is diastolic dysfunction.  She has no other distinct complaints.  Physical exam: Alert elderly patient who is anxious.  No respiratory distress.  No dysarthria.  No aphasia.  Medical screening examination/treatment/procedure(s) were conducted as a shared visit with non-physician practitioner(s) and myself.  I personally evaluated the patient during the encounter    Daleen Bo, MD 03/31/20 2338

## 2020-03-31 NOTE — ED Notes (Signed)
Reviewed discharge instructions with patient. Follow-up care reviewed. Patient verbalized understanding. Patient A&Ox4, VSS, and ambulatory with steady gait upon discharge.  

## 2020-04-07 ENCOUNTER — Other Ambulatory Visit: Payer: Self-pay

## 2020-04-07 ENCOUNTER — Encounter (HOSPITAL_COMMUNITY): Payer: Self-pay | Admitting: Cardiology

## 2020-04-07 ENCOUNTER — Ambulatory Visit (HOSPITAL_COMMUNITY)
Admission: RE | Admit: 2020-04-07 | Discharge: 2020-04-07 | Disposition: A | Discharge status: Home / Self Care | Payer: Medicare Other | Source: Ambulatory Visit | Attending: Cardiology | Admitting: Cardiology

## 2020-04-07 VITALS — BP 180/90 | HR 71 | Wt 163.2 lb

## 2020-04-07 DIAGNOSIS — R079 Chest pain, unspecified: Secondary | ICD-10-CM | POA: Insufficient documentation

## 2020-04-07 DIAGNOSIS — I110 Hypertensive heart disease with heart failure: Secondary | ICD-10-CM | POA: Insufficient documentation

## 2020-04-07 DIAGNOSIS — I5032 Chronic diastolic (congestive) heart failure: Secondary | ICD-10-CM | POA: Insufficient documentation

## 2020-04-07 DIAGNOSIS — I4581 Long QT syndrome: Secondary | ICD-10-CM | POA: Insufficient documentation

## 2020-04-07 DIAGNOSIS — Z8249 Family history of ischemic heart disease and other diseases of the circulatory system: Secondary | ICD-10-CM | POA: Diagnosis not present

## 2020-04-07 DIAGNOSIS — I480 Paroxysmal atrial fibrillation: Secondary | ICD-10-CM | POA: Insufficient documentation

## 2020-04-07 DIAGNOSIS — R0609 Other forms of dyspnea: Secondary | ICD-10-CM

## 2020-04-07 DIAGNOSIS — Z833 Family history of diabetes mellitus: Secondary | ICD-10-CM | POA: Insufficient documentation

## 2020-04-07 DIAGNOSIS — I471 Supraventricular tachycardia: Secondary | ICD-10-CM | POA: Diagnosis not present

## 2020-04-07 DIAGNOSIS — Z79899 Other long term (current) drug therapy: Secondary | ICD-10-CM | POA: Diagnosis not present

## 2020-04-07 DIAGNOSIS — Z888 Allergy status to other drugs, medicaments and biological substances status: Secondary | ICD-10-CM | POA: Insufficient documentation

## 2020-04-07 DIAGNOSIS — R0600 Dyspnea, unspecified: Secondary | ICD-10-CM | POA: Diagnosis not present

## 2020-04-07 DIAGNOSIS — E1140 Type 2 diabetes mellitus with diabetic neuropathy, unspecified: Secondary | ICD-10-CM | POA: Diagnosis not present

## 2020-04-07 DIAGNOSIS — Z881 Allergy status to other antibiotic agents status: Secondary | ICD-10-CM | POA: Insufficient documentation

## 2020-04-07 MED ORDER — LOSARTAN POTASSIUM 25 MG PO TABS: 25.0000 mg | ORAL_TABLET | Freq: Every day | ORAL | 3 refills | Status: DC

## 2020-04-07 MED ORDER — NITROGLYCERIN 0.4 MG SL SUBL: 0.4000 mg | SUBLINGUAL_TABLET | SUBLINGUAL | 2 refills | Status: DC | PRN
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
## 2020-04-07 NOTE — Patient Instructions (Signed)
Start Losartan 25 mg Daiy  Your physician recommends that you return for lab work in: 10-14 days  You have been ordered a PYP Scan.  This is done in the Radiology Department of [facility].  When you come for this test please plan to be there 2-3 hours.  Please follow up with our heart failure pharmacist in 3-4 weeks  Your physician recommends that you schedule a follow-up appointment in: 4 months  If you have any questions or concerns before your next appointment please send [place] a message through [place] or call our office at [phone].    TO LEAVE A MESSAGE FOR THE NURSE SELECT OPTION 2, PLEASE LEAVE A MESSAGE INCLUDING: . YOUR NAME . DATE OF BIRTH . CALL BACK NUMBER . REASON FOR CALL**this is important as we prioritize the call backs  [place] AS LONG AS YOU CALL BEFORE 4:00 PM  At the [facility], you and your health needs are our priority. As part of our continuing mission to provide you with exceptional heart care, we have created designated Provider Care Teams. These Care Teams include your primary Cardiologist (physician) and Advanced Practice Providers (APPs- Physician Assistants and Nurse Practitioners) who all work together to provide you with the care you need, when you need it.   You may see any of the following providers on your designated Care Team at your next follow up: Marland Kitchen Dr Glori Bickers . Dr Loralie Champagne . Darrick Grinder, NP . Lyda Jester, PA . Audry Riles, PharmD   Please be sure to bring in all your medications bottles to every appointment.

## 2020-04-09 ENCOUNTER — Other Ambulatory Visit (HOSPITAL_COMMUNITY): Payer: Self-pay | Admitting: Cardiology

## 2020-04-10 DIAGNOSIS — H5203 Hypermetropia, bilateral: Secondary | ICD-10-CM | POA: Diagnosis not present

## 2020-04-10 DIAGNOSIS — H2513 Age-related nuclear cataract, bilateral: Secondary | ICD-10-CM | POA: Diagnosis not present

## 2020-04-19 ENCOUNTER — Encounter (HOSPITAL_COMMUNITY): Payer: Self-pay

## 2020-04-20 ENCOUNTER — Other Ambulatory Visit (HOSPITAL_COMMUNITY): Payer: Self-pay | Admitting: *Deleted

## 2020-04-20 DIAGNOSIS — R079 Chest pain, unspecified: Secondary | ICD-10-CM

## 2020-04-20 NOTE — Telephone Encounter (Signed)
Order placed, will reach out to patient to schedule

## 2020-04-20 NOTE — Progress Notes (Signed)
ca

## 2020-04-22 ENCOUNTER — Ambulatory Visit (HOSPITAL_COMMUNITY)
Admission: RE | Admit: 2020-04-22 | Discharge: 2020-04-22 | Disposition: A | Discharge status: Home / Self Care | Payer: Medicare Other | Source: Ambulatory Visit | Attending: Internal Medicine | Admitting: Internal Medicine

## 2020-04-22 ENCOUNTER — Other Ambulatory Visit (HOSPITAL_COMMUNITY): Payer: Self-pay | Admitting: *Deleted

## 2020-04-22 ENCOUNTER — Encounter (HOSPITAL_COMMUNITY): Payer: Self-pay

## 2020-04-22 ENCOUNTER — Other Ambulatory Visit: Payer: Self-pay

## 2020-04-22 DIAGNOSIS — I5032 Chronic diastolic (congestive) heart failure: Secondary | ICD-10-CM

## 2020-04-22 LAB — BASIC METABOLIC PANEL
Anion gap: 10 (ref 5–15)
BUN: 7 mg/dL — ABNORMAL LOW (ref 8–23)
CO2: 26 mmol/L (ref 22–32)
Calcium: 10.2 mg/dL (ref 8.9–10.3)
Chloride: 102 mmol/L (ref 98–111)
Creatinine, Ser: 0.79 mg/dL (ref 0.44–1.00)
GFR, Estimated: >60 mL/min (ref >60)
Glucose, Bld: 94 mg/dL (ref 70–99)
Potassium: 3.8 mmol/L (ref 3.5–5.1)
Sodium: 138 mmol/L (ref 135–145)

## 2020-04-28 ENCOUNTER — Ambulatory Visit (HOSPITAL_COMMUNITY)
Admission: RE | Admit: 2020-04-28 | Discharge: 2020-04-28 | Disposition: A | Discharge status: Home / Self Care | Payer: Medicare Other | Source: Ambulatory Visit | Attending: Pharmacist | Admitting: Pharmacist

## 2020-04-28 ENCOUNTER — Other Ambulatory Visit: Payer: Self-pay

## 2020-04-28 VITALS — BP 144/84 | HR 78 | Wt 165.2 lb

## 2020-04-28 DIAGNOSIS — I4581 Long QT syndrome: Secondary | ICD-10-CM | POA: Insufficient documentation

## 2020-04-28 DIAGNOSIS — R0789 Other chest pain: Secondary | ICD-10-CM | POA: Insufficient documentation

## 2020-04-28 DIAGNOSIS — I480 Paroxysmal atrial fibrillation: Secondary | ICD-10-CM | POA: Insufficient documentation

## 2020-04-28 DIAGNOSIS — I471 Supraventricular tachycardia: Secondary | ICD-10-CM | POA: Diagnosis not present

## 2020-04-28 DIAGNOSIS — I5032 Chronic diastolic (congestive) heart failure: Secondary | ICD-10-CM | POA: Insufficient documentation

## 2020-04-28 DIAGNOSIS — I442 Atrioventricular block, complete: Secondary | ICD-10-CM | POA: Diagnosis not present

## 2020-04-28 DIAGNOSIS — I110 Hypertensive heart disease with heart failure: Secondary | ICD-10-CM | POA: Diagnosis not present

## 2020-04-28 DIAGNOSIS — I5022 Chronic systolic (congestive) heart failure: Secondary | ICD-10-CM

## 2020-04-28 DIAGNOSIS — R55 Syncope and collapse: Secondary | ICD-10-CM | POA: Insufficient documentation

## 2020-04-28 MED ORDER — LOSARTAN POTASSIUM 25 MG PO TABS
25.0000 mg | ORAL_TABLET | Freq: Two times a day (BID) | ORAL | 3 refills | Status: DC
Start: 2020-04-28 — End: 2020-07-02

## 2020-04-28 NOTE — Patient Instructions (Addendum)
It was a pleasure seeing you today!  MEDICATIONS: -We are changing your medications today -Increase losartan to 25 mg (1 tablet) twice daily.  -Call if you have questions about your medications.   NEXT APPOINTMENT: Return to clinic in 4 months with Dr. Aundra Dubin.  In general, to take care of your heart failure: -Limit your fluid intake to 2 Liters (half-gallon) per day.   -Limit your salt intake to ideally 2-3 grams (2000-3000 mg) per day. -Weigh yourself daily and record, and bring that "weight diary" to your next appointment.  (Weight gain of 2-3 pounds in 1 day typically means fluid weight.) -The medications for your heart are to help your heart and help you live longer.   -Please contact us before stopping any of your heart medications.  Call the clinic at [phone] with questions or to reschedule future appointments.

## 2020-04-28 NOTE — Progress Notes (Signed)
PCP:  Biagio Borg, MD      Cardiologist:  Dr. Aundra Dubin  HPI:  Renee [name] is a 74 y.o. female who has a h/o AVNRT ablation, NSVT, long QT syndrome, and chronic chest pain syndrome. She has a long history of chest pain. Coronary CT angiogram in 10/14 showed no coronary plaque and calcium score = 0.   She has been on diltiazem CD which helped her chest pain in the past.  She was admitted overnight in 4/18 with shoulder pain and diaphoresis. She took nitroglycerin and got lightheaded. Troponin was negative x 3, ECG was unremarkable. She was noted on device interrogation to have had an episode of SVT in the 160s. This, however, was asymptomatic and not related to the admission. Metoprolol succinate 12.5 mg daily was added to her regimen but she subsequently stopped it.   With SVT runs on device interrogation in 03/2017, I increased her flecainide was increased to 100 mg bid. It was subsequently decreased back to 50 mg bid.   With increased chest pain, she had coronary CTA again in 04/2018.  This showed coronary artery calcium score 0, no significant coronary disease.   Echo in 10/2018 showed EF 95-28%, grade 2 diastolic dysfunction, normal RV size and systolic function.  Echo in 09/2019 with EF 55-60%, moderate LVH, normal RV.   She has a history of "spells" where she is profoundly weak, breathless, lightheaded, flushed, "shaky," "spaced out," and nauseated. She has had extensive workup of these spells. She saw Dr Lovena Le for placement of loop recorder. He also had her start flecainide due to concern for SVT. She had a tilt test in 12/14 that was negative. Finally, in 02/12/20, her monitor captured a 7 second pause during the day associated with presyncope. She had a Biotronik dual chamber PPM placed.   She was recently seen for follow-up with Dr. Aundra Dubin on 04/07/20. She reported still having "spells" where she felt profoundly weak, lightheaded, tremulous, and short of breath.  Interrogation of device did not show corresponding arrhythmias.  BP had been running high, SBP 130s-150s when she checked at home. BP 180/90 was in clinic. Did not have chest pain or orthopnea/PND.  Was generally able to walk on flat ground without problem except when she had a "spell."  When these occurred, she had to sit down. She continued to note neuropathic-type pain and numbness in her feet and hands.  Today she returns to HF clinic for pharmacist medication titration. At last visit with MD, losartan 25 mg daily was started. She reports feeling well overall. She has not had any "spells" of weakness, lightheadedness, or SOB since her last visit. She does report having bad days once in a while but cannot identify what might be causing this. Her SBP at home have been 125-160s. She continues to have some nausea but her appetite is OK and she is following a low-salt diet. She stays active at her job and completing daily errands. She takes naproxen PRN for joint pain but is willing to switch to acetaminophen to prevent increased BP. She has not taken her potassium since before her last clinic visit.  HTN Medications: Losartan 25 mg daily Furosemide 20 mg daily Potassium Chloride 30 mEq daily - not taking  Has the patient been experiencing any side effects to the medications prescribed?  no  Does the patient have any problems obtaining medications due to transportation or finances?   no  Understanding of regimen: fair Understanding of indications: good Potential  of compliance: good Patient understands to avoid NSAIDs. Patient understands to avoid decongestants.    Pertinent Lab Values 04/22/20: . Serum creatinine 0.79, BUN 7, Potassium 3.8, Sodium 138   Vital Signs: . Weight: 165.2 lbs (last clinic weight: 163 lbs) . Blood pressure: 144/84  . Heart rate: 78   Assessment: 1. SVT: Prior history of AVNRT ablation. On flecainide 50 mg bid, she is much less symptomatic in terms of  palpitations.  - Continue diltiazem CD 240 mg daily.  - Continue flecainide 50 mg BID  2. Long QT syndrome: The QT interval has been normal to marginally elevated on ECGs since I have seen her. Avoid QT-prolonging medications.   3. Chest pain syndrome: Long history of atypical chest pain. Workups have not been suggestive of macrovascular coronary disease. Cannot rule out microvascular angina or coronary vasospasm. Coronary CTA in 11/19 was normal.  No recent chest pain.  - Continue diltiazem CD for possible microvascular angina.   4. HTN: BP remains above goal of <130/80 but is significantly improved since last visit. - Increase losartan 25 mg BID. Check BMET in 2-3 weeks.  5. Syncope/complete heart block: 7 second waking pause seen on monitor in past. Likely vagal etiology but nonetheless concerning presentation. Biotronik dual chamber PPM was placed.  She subsequently developed pacemaker syndrome and was markedly symptomatic with RV pacing.  She is now set at [facility] with no problems  6. Atrial fibrillation: Paroxysmal. Very short runs of atrial fibrillation have been noted on device interrogation. Given lack of longer runs of atrial fibrillation, there is no clear anticoagulation indication. Monitor closely.   7. Chronic diastolic CHF: Echo in 4/01 with EF 55-60%, moderate LVH.  Now on a low dose of Lasix.  She is not volume overloaded on exam. She still describes NYHA class III symptoms that seem to wax and wane.  - Continue furosemide 20 mg daily. BMET today.  - Given diastolic CHF with LVH on echo and neuropathy symptoms, would consider cardiac amyloidosis.  Myeloma panel and urine immunofixation were negative.  Currently in the process of arranging for a PYP scan.   Plan: 1) Medication changes: Based on clinical presentation, vital signs and recent labs will increase losartan to 25 mg BID 2) Labs: BMET in 2-3 weeks 3) Follow-up: with Dr. Aundra Dubin on 08/11/20  Richardine Service,  PharmD PGY2 Cardiology Pharmacy Resident  Audry Riles, PharmD, BCPS, [place], CPP Heart Failure Clinic Pharmacist [phone]

## 2020-04-30 ENCOUNTER — Ambulatory Visit (INDEPENDENT_AMBULATORY_CARE_PROVIDER_SITE_OTHER): Payer: Medicare Other

## 2020-04-30 ENCOUNTER — Other Ambulatory Visit: Payer: Self-pay

## 2020-04-30 DIAGNOSIS — Z23 Encounter for immunization: Secondary | ICD-10-CM | POA: Diagnosis not present

## 2020-05-01 ENCOUNTER — Telehealth (HOSPITAL_COMMUNITY): Payer: Self-pay | Admitting: Emergency Medicine

## 2020-05-01 ENCOUNTER — Other Ambulatory Visit (HOSPITAL_COMMUNITY): Payer: Self-pay | Admitting: Emergency Medicine

## 2020-05-01 DIAGNOSIS — R079 Chest pain, unspecified: Secondary | ICD-10-CM

## 2020-05-01 MED ORDER — METOPROLOL TARTRATE 50 MG PO TABS: 50.0000 mg | ORAL_TABLET | Freq: Once | ORAL | 0 refills | Status: DC

## 2020-05-01 NOTE — Progress Notes (Signed)
Order for one time dose 50mg  metoprolol tartrate to be taken 2 hr prior to scan.   Marchia Bond RN Navigator Cardiac Imaging [facility] [phone] Office  [phone] Cell

## 2020-05-01 NOTE — Telephone Encounter (Signed)
Phone call to patient to inform her that I had prescribed a one time dose 50mg  metoprolol tartrate to be taken 2 hr prior to CCTA on 05/14/20. Sent to her pharm on file ([place]).   Left detailed message with callback number.  Marchia Bond RN Navigator Cardiac Imaging [facility] Services [phone] Office  [phone] Cell

## 2020-05-11 ENCOUNTER — Ambulatory Visit (INDEPENDENT_AMBULATORY_CARE_PROVIDER_SITE_OTHER): Payer: Medicare Other

## 2020-05-11 DIAGNOSIS — R55 Syncope and collapse: Secondary | ICD-10-CM | POA: Diagnosis not present

## 2020-05-11 LAB — CUP PACEART REMOTE DEVICE CHECK
Date Time Interrogation Session: 20211129065314
Implantable Lead Implant Date: 20170921
Implantable Lead Implant Date: 20170921
Implantable Lead Location: 753859
Implantable Lead Location: 753860
Implantable Lead Model: 377
Implantable Lead Model: 377
Implantable Lead Serial Number: 49575519
Implantable Lead Serial Number: 49580836
Implantable Pulse Generator Implant Date: 20170921
Pulse Gen Model: 407145
Pulse Gen Serial Number: 68850891

## 2020-05-13 ENCOUNTER — Telehealth (HOSPITAL_COMMUNITY): Payer: Self-pay | Admitting: Emergency Medicine

## 2020-05-13 NOTE — Telephone Encounter (Signed)
Attempted to call patient regarding upcoming cardiac CT appointment. °Left message on voicemail with name and callback number °Iola Turri RN Navigator Cardiac Imaging °[place] [facility] °[phone] Office °[phone] Cell ° °

## 2020-05-14 ENCOUNTER — Ambulatory Visit (HOSPITAL_COMMUNITY)
Admission: RE | Admit: 2020-05-14 | Discharge: 2020-05-14 | Disposition: A | Discharge status: Home / Self Care | Payer: Medicare Other | Source: Ambulatory Visit | Attending: Cardiology | Admitting: Cardiology

## 2020-05-14 ENCOUNTER — Telehealth (HOSPITAL_COMMUNITY): Payer: Self-pay | Admitting: Emergency Medicine

## 2020-05-14 ENCOUNTER — Other Ambulatory Visit: Payer: Self-pay

## 2020-05-14 ENCOUNTER — Encounter (HOSPITAL_COMMUNITY): Payer: Self-pay

## 2020-05-14 DIAGNOSIS — Z136 Encounter for screening for cardiovascular disorders: Secondary | ICD-10-CM | POA: Diagnosis not present

## 2020-05-14 DIAGNOSIS — R0789 Other chest pain: Secondary | ICD-10-CM | POA: Insufficient documentation

## 2020-05-14 DIAGNOSIS — R079 Chest pain, unspecified: Secondary | ICD-10-CM | POA: Diagnosis not present

## 2020-05-14 DIAGNOSIS — Z9189 Other specified personal risk factors, not elsewhere classified: Secondary | ICD-10-CM | POA: Insufficient documentation

## 2020-05-14 MED ORDER — IOHEXOL 350 MG/ML SOLN
80.0000 mL | Freq: Once | INTRAVENOUS | Status: AC | PRN
  Administered 2020-05-14: 80 mL via INTRAVENOUS

## 2020-05-14 MED ORDER — NITROGLYCERIN 0.4 MG SL SUBL
SUBLINGUAL_TABLET | SUBLINGUAL | Status: AC
  Administered 2020-05-14: 0.8 mg via SUBLINGUAL
  Filled 2020-05-14: qty 2

## 2020-05-14 MED ORDER — NITROGLYCERIN 0.4 MG SL SUBL: 0.8000 mg | SUBLINGUAL_TABLET | Freq: Once | SUBLINGUAL | Status: AC

## 2020-05-14 NOTE — Telephone Encounter (Signed)
Attempted to call patient regarding upcoming cardiac CT appointment. °Left message on voicemail with name and callback number °Kurstin Dimarzo RN Navigator Cardiac Imaging °[place] [facility] °[phone] Office °[phone] Cell ° °

## 2020-05-15 NOTE — Progress Notes (Signed)
Remote pacemaker transmission.   

## 2020-05-20 ENCOUNTER — Encounter (HOSPITAL_COMMUNITY): Payer: Self-pay

## 2020-05-20 ENCOUNTER — Other Ambulatory Visit: Payer: Self-pay | Admitting: Internal Medicine

## 2020-05-20 ENCOUNTER — Encounter: Payer: Self-pay | Admitting: Internal Medicine

## 2020-05-20 DIAGNOSIS — N644 Mastodynia: Secondary | ICD-10-CM

## 2020-05-21 ENCOUNTER — Other Ambulatory Visit (HOSPITAL_COMMUNITY): Payer: Self-pay

## 2020-05-21 ENCOUNTER — Ambulatory Visit (HOSPITAL_COMMUNITY)
Admission: RE | Admit: 2020-05-21 | Discharge: 2020-05-21 | Disposition: A | Discharge status: Home / Self Care | Payer: Medicare Other | Source: Ambulatory Visit | Attending: Internal Medicine | Admitting: Internal Medicine

## 2020-05-21 ENCOUNTER — Other Ambulatory Visit: Payer: Self-pay

## 2020-05-21 DIAGNOSIS — I5022 Chronic systolic (congestive) heart failure: Secondary | ICD-10-CM

## 2020-05-21 LAB — BASIC METABOLIC PANEL
Anion gap: 12 (ref 5–15)
BUN: 7 mg/dL — ABNORMAL LOW (ref 8–23)
CO2: 24 mmol/L (ref 22–32)
Calcium: 9.9 mg/dL (ref 8.9–10.3)
Chloride: 102 mmol/L (ref 98–111)
Creatinine, Ser: 0.8 mg/dL (ref 0.44–1.00)
GFR, Estimated: >60 mL/min (ref >60)
Glucose, Bld: 85 mg/dL (ref 70–99)
Potassium: 3.8 mmol/L (ref 3.5–5.1)
Sodium: 138 mmol/L (ref 135–145)

## 2020-05-22 ENCOUNTER — Other Ambulatory Visit: Payer: Self-pay | Admitting: Gastroenterology

## 2020-05-27 DIAGNOSIS — N3000 Acute cystitis without hematuria: Secondary | ICD-10-CM | POA: Diagnosis not present

## 2020-06-10 ENCOUNTER — Other Ambulatory Visit: Payer: Self-pay | Admitting: Internal Medicine

## 2020-06-10 DIAGNOSIS — N644 Mastodynia: Secondary | ICD-10-CM

## 2020-06-15 DIAGNOSIS — Z20822 Contact with and (suspected) exposure to covid-19: Secondary | ICD-10-CM | POA: Diagnosis not present

## 2020-06-17 ENCOUNTER — Encounter: Payer: Self-pay | Admitting: Internal Medicine

## 2020-06-17 ENCOUNTER — Telehealth: Payer: Self-pay | Admitting: Internal Medicine

## 2020-06-17 MED ORDER — HYDROCODONE-HOMATROPINE 5-1.5 MG/5ML PO SYRP: 5.0000 mL | ORAL_SOLUTION | Freq: Four times a day (QID) | ORAL | 0 refills | Status: DC | PRN

## 2020-06-17 MED ORDER — ONDANSETRON HCL 4 MG PO TABS: 4.0000 mg | ORAL_TABLET | Freq: Three times a day (TID) | ORAL | 1 refills | Status: DC | PRN

## 2020-06-17 MED ORDER — DEXAMETHASONE 6 MG PO TABS: 6.0000 mg | ORAL_TABLET | Freq: Every day | ORAL | 0 refills | Status: AC

## 2020-06-17 NOTE — Telephone Encounter (Signed)
HYDROcodone-homatropine (HYCODAN) 5-1.5 MG/5ML syrup [facility] is out of this medication [facility]   [id number] [address] [facility], [place] - [address] Phone:  [phone]  Fax:  [phone]

## 2020-06-17 NOTE — Telephone Encounter (Signed)
Stacy   -   To please refer pt to the [facility] monoclonal Ab hotline  -      Not for the antibody tx, but for the 3 days IV remdisivir

## 2020-06-17 NOTE — Telephone Encounter (Signed)
Message sent to [facility]

## 2020-06-18 ENCOUNTER — Other Ambulatory Visit: Payer: Medicare Other

## 2020-06-18 DIAGNOSIS — N3000 Acute cystitis without hematuria: Secondary | ICD-10-CM | POA: Diagnosis not present

## 2020-06-18 MED ORDER — PROMETHAZINE-CODEINE 6.25-10 MG/5ML PO SYRP: 5.0000 mL | ORAL_SOLUTION | Freq: Four times a day (QID) | ORAL | 0 refills | Status: DC | PRN

## 2020-06-18 NOTE — Telephone Encounter (Signed)
Cary from [facility] ([place]) called   The pharmacy is unable to fill the  promethazine-codeine (PHENERGAN WITH CODEINE) 6.25-10 MG/5ML syrup  Per [facility] [employer] stopped carrying this medication due to the forging of prescriptions. The patient did pick up the  ondansetron (ZOFRAN) 4 MG tablet but will need to fill the other medication at a different pharmacy.   Please advise.

## 2020-06-18 NOTE — Telephone Encounter (Signed)
Sent to Dr. John to advise. 

## 2020-06-18 NOTE — Telephone Encounter (Signed)
Ok for change to phenergan with codeine   Done hardcopy to fax if this might work, as the erx failed twice

## 2020-06-19 MED ORDER — BENZONATATE 100 MG PO CAPS: 100.0000 mg | ORAL_CAPSULE | Freq: Three times a day (TID) | ORAL | 0 refills | Status: DC | PRN

## 2020-06-19 NOTE — Telephone Encounter (Signed)
Ok for tessalon perle 

## 2020-06-19 NOTE — Telephone Encounter (Signed)
Sent to Dr. John. 

## 2020-06-19 NOTE — Addendum Note (Signed)
Addended by: Biagio Borg on: 06/19/2020 01:16 PM   Modules accepted: Orders

## 2020-06-21 DIAGNOSIS — Z20822 Contact with and (suspected) exposure to covid-19: Secondary | ICD-10-CM | POA: Diagnosis not present

## 2020-06-23 ENCOUNTER — Encounter: Payer: Self-pay | Admitting: Internal Medicine

## 2020-06-24 DIAGNOSIS — N3000 Acute cystitis without hematuria: Secondary | ICD-10-CM | POA: Diagnosis not present

## 2020-06-24 DIAGNOSIS — R350 Frequency of micturition: Secondary | ICD-10-CM | POA: Diagnosis not present

## 2020-06-24 NOTE — Progress Notes (Signed)
Called pt there was no answer LMOM letter ready for pick-up../lmb °

## 2020-07-01 DIAGNOSIS — I499 Cardiac arrhythmia, unspecified: Secondary | ICD-10-CM | POA: Diagnosis not present

## 2020-07-01 DIAGNOSIS — R079 Chest pain, unspecified: Secondary | ICD-10-CM | POA: Diagnosis not present

## 2020-07-01 DIAGNOSIS — I700 Atherosclerosis of aorta: Secondary | ICD-10-CM | POA: Diagnosis not present

## 2020-07-01 DIAGNOSIS — R1013 Epigastric pain: Secondary | ICD-10-CM | POA: Diagnosis not present

## 2020-07-01 DIAGNOSIS — Z5321 Procedure and treatment not carried out due to patient leaving prior to being seen by health care provider: Secondary | ICD-10-CM | POA: Diagnosis not present

## 2020-07-02 ENCOUNTER — Encounter (HOSPITAL_COMMUNITY): Payer: Self-pay

## 2020-07-02 ENCOUNTER — Other Ambulatory Visit (HOSPITAL_COMMUNITY): Payer: Self-pay | Admitting: *Deleted

## 2020-07-02 MED ORDER — LOSARTAN POTASSIUM 50 MG PO TABS: 50.0000 mg | ORAL_TABLET | Freq: Two times a day (BID) | ORAL | 3 refills | Status: DC

## 2020-07-03 ENCOUNTER — Telehealth (HOSPITAL_COMMUNITY): Payer: Self-pay

## 2020-07-03 DIAGNOSIS — I10 Essential (primary) hypertension: Secondary | ICD-10-CM

## 2020-07-03 NOTE — Telephone Encounter (Signed)
-----   Message from Larey Dresser, MD sent at 07/02/2020 11:06 AM EST ----- Please have her increase losartan to 50 mg bid with BMET in 10 days for high BP.

## 2020-07-03 NOTE — Telephone Encounter (Signed)
Patient advised and verbalized understanding. Lab order entered,lab appt scheduled,med list updated to reflect changes.

## 2020-07-07 ENCOUNTER — Telehealth (INDEPENDENT_AMBULATORY_CARE_PROVIDER_SITE_OTHER): Payer: Medicare Other | Admitting: Internal Medicine

## 2020-07-07 DIAGNOSIS — R059 Cough, unspecified: Secondary | ICD-10-CM | POA: Diagnosis not present

## 2020-07-07 MED ORDER — HYDROCODONE-HOMATROPINE 5-1.5 MG/5ML PO SYRP
5.0000 mL | ORAL_SOLUTION | Freq: Four times a day (QID) | ORAL | 0 refills | Status: DC | PRN
Start: 2020-07-07 — End: 2020-08-11

## 2020-07-07 NOTE — Progress Notes (Signed)
Patient ID: Renee [name], female   DOB: Nov 11, 1945, 75 y.o.   MRN: [id number]  Cumulative time during 7-day interval 14 min, there was not an associated office visit for this concern within a 7 day period.  Verbal consent for services obtained from patient prior to services given.  Names of all persons present for services: Cathlean Cower, MD, patient  Chief complaint: persistent cough  History, background, results pertinent:  Here to f/u recent covid illness with symptoms starting about jan 1 mostly URI related with  Myalgias, tested + on jan 4, also with some nausea and fatigue, lower appetite, requiring several attempts at symptom improvement including tessalon perle, zofran, then phenergan with codeine, with most sypmtoms resolved by jan 10, and wants to return to work in [employer] as she does 4 days per wk, but knows that this will lead to consternation among her coworkers due to the persistent dry cough and cannot speak for a few seconds after.  Pt is asking for different cough med for hopefully better control to avoid this and be able to continue working.  Losartan also increased to 50 bid per cardiology recently for better BP control.  Pt denies chest pain, increased sob or doe, wheezing, orthopnea, PND, increased LE swelling, palpitations, dizziness or syncope.  Pt denies new neurological symptoms such as new headache, or facial or extremity weakness or numbness   Pt denies polydipsia, polyuria   Note pt is s/p covid Vax x 2.    Past Medical History:  Diagnosis Date  . Allergy    tape, electrodes  . Anxiety   . AV nodal re-entry tachycardia [facility])    a. s/p RFA 1993- Dr. Rolland Porter, [place]  . Broken neck ([place])    1993  . Cancer (HCC)    hx of cervical   ca  . Chronic chest pain    a. 2005 Cath: Mild nonobstructive plaque (30-40% LCX);  b. 08/2011 low risk myoview;  c. 09/2011 Coronary CT: NL cors w ca score of 0.  . Chronic pain syndrome   . Complication of anesthesia    blood pressure and heart  dropped in 2009    . Concussion    1979, 1993  . Degenerative joint disease    a. s/p L Total Knee Arthroplasty.  . Diet-controlled type 2 diabetes mellitus ([place])    pt. denies  . Diverticulosis of colon   . Duodenitis without hemorrhage   . Dysrhythmia    REPORTS , SINCE PLACEMENT OF PACEMAKER, STILL CAN FEEL HEART " GOING IN AND OUT OF RYTHYN" REPORTS, YESTERDAY HER BP SYSTOLIC WAS IN THE 619J AND WAS BREATHLESS, HR 112, SWEATING ; DENIES SYNCOPE  , DENIES CHEST PAIN   . Fibromyalgia   . GERD (gastroesophageal reflux disease)   . Hemorrhoids   . Hypertension   . Irritable bowel syndrome   . Long QT interval    a. mild - advised to avoid meds that may prolong QT.  Marland Kitchen Myocardial infarction [facility]) 09/1991   does not know when second heart attack was  . Neuropathy   . Osteoporosis   . Paroxysmal A-fib (HCC)    PER PATIENT. I CAN FEEL MY HEART RACING WHEN IM IN IT   . Recurrent upper respiratory infection (URI)   . SVT (supraventricular tachycardia) (HCC)    a. nonsustained SVT - previously offered flecainide but refused;  b. 12/2012 30 day event monitor w/o significant arrhythmias.  . Thyroid nodule    No results found  for this or any previous visit (from the past 48 hour(s)).    A/P/next steps:   Post COVID persistent cough - ok for hycodan prn asd,  to f/u any worsening symptoms or concerns; pt should look for covid booster at 90 days post covid - about April 1.  Cathlean Cower MD

## 2020-07-12 ENCOUNTER — Encounter: Payer: Self-pay | Admitting: Internal Medicine

## 2020-07-12 NOTE — Assessment & Plan Note (Signed)
Post covid - see notes

## 2020-07-12 NOTE — Patient Instructions (Signed)
Please take all new medication as prescribed  You are adivsed for covid booster at 90 days post covid - on or about September 11, 2020

## 2020-07-13 ENCOUNTER — Other Ambulatory Visit: Payer: Self-pay

## 2020-07-13 ENCOUNTER — Ambulatory Visit (HOSPITAL_COMMUNITY)
Admission: RE | Admit: 2020-07-13 | Discharge: 2020-07-13 | Disposition: A | Discharge status: Home / Self Care | Payer: Medicare Other | Source: Ambulatory Visit | Attending: Cardiology | Admitting: Cardiology

## 2020-07-13 DIAGNOSIS — I10 Essential (primary) hypertension: Secondary | ICD-10-CM | POA: Diagnosis not present

## 2020-07-13 LAB — BASIC METABOLIC PANEL
Anion gap: 9 (ref 5–15)
BUN: 12 mg/dL (ref 8–23)
CO2: 25 mmol/L (ref 22–32)
Calcium: 9.9 mg/dL (ref 8.9–10.3)
Chloride: 104 mmol/L (ref 98–111)
Creatinine, Ser: 0.86 mg/dL (ref 0.44–1.00)
GFR, Estimated: >60 mL/min (ref >60)
Glucose, Bld: 100 mg/dL — ABNORMAL HIGH (ref 70–99)
Potassium: 4.2 mmol/L (ref 3.5–5.1)
Sodium: 138 mmol/L (ref 135–145)

## 2020-07-21 ENCOUNTER — Encounter (HOSPITAL_COMMUNITY): Payer: Self-pay

## 2020-07-21 ENCOUNTER — Encounter: Payer: Self-pay | Admitting: Internal Medicine

## 2020-07-21 ENCOUNTER — Ambulatory Visit (INDEPENDENT_AMBULATORY_CARE_PROVIDER_SITE_OTHER): Payer: Medicare Other | Admitting: Internal Medicine

## 2020-07-21 ENCOUNTER — Other Ambulatory Visit: Payer: Self-pay

## 2020-07-21 ENCOUNTER — Other Ambulatory Visit (INDEPENDENT_AMBULATORY_CARE_PROVIDER_SITE_OTHER): Payer: Medicare Other

## 2020-07-21 VITALS — BP 156/82 | HR 64 | Temp 98.3°F | Ht 63.0 in | Wt 164.0 lb

## 2020-07-21 DIAGNOSIS — N390 Urinary tract infection, site not specified: Secondary | ICD-10-CM | POA: Diagnosis not present

## 2020-07-21 DIAGNOSIS — Z0000 Encounter for general adult medical examination without abnormal findings: Secondary | ICD-10-CM

## 2020-07-21 DIAGNOSIS — E538 Deficiency of other specified B group vitamins: Secondary | ICD-10-CM

## 2020-07-21 DIAGNOSIS — E559 Vitamin D deficiency, unspecified: Secondary | ICD-10-CM

## 2020-07-21 DIAGNOSIS — R739 Hyperglycemia, unspecified: Secondary | ICD-10-CM

## 2020-07-21 DIAGNOSIS — I10 Essential (primary) hypertension: Secondary | ICD-10-CM | POA: Diagnosis not present

## 2020-07-21 LAB — CBC WITH DIFFERENTIAL/PLATELET
Basophils Absolute: 0 10*3/uL (ref 0.0–0.1)
Basophils Relative: 0.3 % (ref 0.0–3.0)
Eosinophils Absolute: 0 10*3/uL (ref 0.0–0.7)
Eosinophils Relative: 0 % (ref 0.0–5.0)
HCT: 41.3 % (ref 36.0–46.0)
Hemoglobin: 13.7 g/dL (ref 12.0–15.0)
Lymphocytes Relative: 20.8 % (ref 12.0–46.0)
Lymphs Abs: 2 10*3/uL (ref 0.7–4.0)
MCHC: 33.1 g/dL (ref 30.0–36.0)
MCV: 79.5 fl (ref 78.0–100.0)
Monocytes Absolute: 0.6 10*3/uL (ref 0.1–1.0)
Monocytes Relative: 6.7 % (ref 3.0–12.0)
Neutro Abs: 6.9 10*3/uL (ref 1.4–7.7)
Neutrophils Relative %: 72.2 % (ref 43.0–77.0)
Platelets: 310 10*3/uL (ref 150.0–400.0)
RBC: 5.19 Mil/uL — ABNORMAL HIGH (ref 3.87–5.11)
RDW: 14 % (ref 11.5–15.5)
WBC: 9.5 10*3/uL (ref 4.0–10.5)

## 2020-07-21 LAB — MICROALBUMIN / CREATININE URINE RATIO
Creatinine,U: 100.2 mg/dL
Microalb Creat Ratio: 2.9 mg/g (ref 0.0–30.0)
Microalb, Ur: 2.9 mg/dL — ABNORMAL HIGH (ref 0.0–1.9)

## 2020-07-21 NOTE — Assessment & Plan Note (Signed)
Also for urine culture °

## 2020-07-21 NOTE — Assessment & Plan Note (Signed)
Lab Results  Component Value Date   HGBA1C 5.8 03/05/2019   Stable, pt to continue current medical treatment  - diet

## 2020-07-21 NOTE — Assessment & Plan Note (Signed)
Age and sex appropriate education and counseling updated with regular exercise and diet Referrals for preventative services - declines optho referral, has mammogram scheduled Immunizations addressed - none needed Smoking counseling  - none needed Evidence for depression or other mood disorder - none significant Most recent labs reviewed. I have personally reviewed and have noted: 1) the patient's medical and social history 2) The patient's current medications and supplements 3) The patient's height, weight, and BMI have been recorded in the chart

## 2020-07-21 NOTE — Patient Instructions (Addendum)
Please continue all other medications as before, and refills have been done if requested.  Please have the pharmacy call with any other refills you may need.  Please continue your efforts at being more active, low cholesterol diet, and weight control.  You are otherwise up to date with prevention measures today.  Please keep your appointments with your specialists as you may have planned  Please go to the LAB at the blood drawing area for the tests to be done - at the [facility] lab  You will be contacted by phone if any changes need to be made immediately.  Otherwise, you will receive a letter about your results with an explanation, but please check with MyChart first.  Please remember to sign up for MyChart if you have not done so, as this will be important to you in the future with finding out test results, communicating by private email, and scheduling acute appointments online when needed.  Please make an Appointment to return in 6 months, or sooner if needed 

## 2020-07-21 NOTE — Assessment & Plan Note (Signed)
BP Readings from Last 3 Encounters:  07/21/20 (!) 156/82  05/14/20 119/63  04/28/20 (!) 144/84   Stable, pt to continue medical treatment - cardizem, losartan

## 2020-07-21 NOTE — Progress Notes (Signed)
Patient ID: Renee [name], female   DOB: [birth year], 75 y.o.   MRN: [id number]         Chief Complaint:: wellness exam and high sugars       HPI:  Renee [name] is a 75 y.o. female here for wellness exam, declines optho exam, due for mammogram soon.   Wt Readings from Last 3 Encounters:  07/21/20 164 lb (74.4 kg)  04/28/20 165 lb 3.2 oz (74.9 kg)  04/07/20 163 lb 3.2 oz (74 kg)   BP Readings from Last 3 Encounters:  07/21/20 (!) 156/82  05/14/20 119/63  04/28/20 (!) 144/84   Immunization History  Administered Date(s) Administered  . Fluad Quad(high Dose 65+) 03/05/2019, 04/30/2020  . Influenza Split 03/14/2011, 04/02/2013  . Influenza Whole 03/11/2009, 03/29/2010  . Influenza, High Dose Seasonal PF 04/30/2015  . Influenza,inj,Quad PF,6+ Mos 06/11/2014, 03/04/2016  . Influenza-Unspecified 03/13/2018  . PFIZER(Purple Top)SARS-COV-2 Vaccination 07/20/2019, 08/10/2019  . Pneumococcal Conjugate-13 12/18/2014  . Pneumococcal Polysaccharide-23 09/07/2015  . Tdap 09/28/2013   Health Maintenance Due  Topic Date Due  . HEMOGLOBIN A1C  09/02/2019         Also s/ps covid infection jan 1, followed by a UTI tx per urology, cough finally resolved.  BS has been mild elevated as well, the opposite of low blood sugars in the past.    Past Medical History:  Diagnosis Date  . Allergy    tape, electrodes  . Anxiety   . AV nodal re-entry tachycardia [facility])    a. s/p RFA 1993- Dr. Rolland Porter, [place]  . Broken neck ([place])    1993  . Cancer (HCC)    hx of cervical   ca  . Chronic chest pain    a. 2005 Cath: Mild nonobstructive plaque (30-40% LCX);  b. 08/2011 low risk myoview;  c. 09/2011 Coronary CT: NL cors w ca score of 0.  . Chronic pain syndrome   . Complication of anesthesia    blood pressure and heart dropped in 2009    . Concussion    1979, 1993  . Degenerative joint disease    a. s/p L Total Knee Arthroplasty.  . Diet-controlled type 2 diabetes mellitus ([place])    pt. denies  .  Diverticulosis of colon   . Duodenitis without hemorrhage   . Dysrhythmia    REPORTS , SINCE PLACEMENT OF PACEMAKER, STILL CAN FEEL HEART " GOING IN AND OUT OF RYTHYN" REPORTS, YESTERDAY HER BP SYSTOLIC WAS IN THE 675F AND WAS BREATHLESS, HR 112, SWEATING ; DENIES SYNCOPE  , DENIES CHEST PAIN   . Fibromyalgia   . GERD (gastroesophageal reflux disease)   . Hemorrhoids   . Hypertension   . Irritable bowel syndrome   . Long QT interval    a. mild - advised to avoid meds that may prolong QT.  Marland Kitchen Myocardial infarction [facility]) 09/1991   does not know when second heart attack was  . Neuropathy   . Osteoporosis   . Paroxysmal A-fib (HCC)    PER PATIENT. I CAN FEEL MY HEART RACING WHEN IM IN IT   . Recurrent upper respiratory infection (URI)   . SVT (supraventricular tachycardia) (HCC)    a. nonsustained SVT - previously offered flecainide but refused;  b. 12/2012 30 day event monitor w/o significant arrhythmias.  . Thyroid nodule    Past Surgical History:  Procedure Laterality Date  . ABLATION OF DYSRHYTHMIC FOCUS  1993  . ANTERIOR AND POSTERIOR REPAIR N/A 08/01/2017  Procedure: ANTERIOR (CYSTOCELE) AND POSTERIOR REPAIR (RECTOCELE);  Surgeon: Alfredo Martinez, MD;  Location: [facility];  Service: Urology;  Laterality: N/A;  . APPENDECTOMY     1980  . BILATERAL SALPINGECTOMY Left 08/01/2017   Procedure: SALPINGECTOMY;  Surgeon: Carrington Clamp, MD;  Location: [facility];  Service: Gynecology;  Laterality: Left;  . BIOPSY THYROID    . CARDIAC ELECTROPHYSIOLOGY STUDY AND ABLATION     atrioventricular nodal reentant tachycardia  . CHOLECYSTECTOMY  1980  . COLONOSCOPY  06/25/2008   Diverticulosis and Hemorrhoids  . COLPOSCOPY    . CORONARY ANGIOPLASTY  2007  . CYSTOSCOPY N/A 08/01/2017   Procedure: CYSTOSCOPY;  Surgeon: Alfredo Martinez, MD;  Location: [facility];  Service: Urology;  Laterality: N/A;  . EP IMPLANTABLE DEVICE N/A 03/03/2016   Procedure: Pacemaker Implant;  Surgeon: Marinus Maw, MD;   Location: [facility] INVASIVE CV LAB;  Service: Cardiovascular;  Laterality: N/A;  . ESOPHAGOGASTRODUODENOSCOPY    . HYSTEROSCOPY  2002   with resection of endometrial polyps. by Dr.MCPhail  . HYSTEROSCOPY WITH D & C N/A 02/05/2015   Procedure: DILATATION AND CURETTAGE /HYSTEROSCOPY;  Surgeon: Waynard Reeds, MD;  Location: [facility];  Service: Gynecology;  Laterality: N/A;  . LOOP RECORDER IMPLANT  08/21/2013   MDT LinQ implanted by Dr Ladona Ridgel for syncope  . LOOP RECORDER IMPLANT N/A 08/21/2013   Procedure: LOOP RECORDER IMPLANT;  Surgeon: Marinus Maw, MD;  Location: [facility] CATH LAB;  Service: Cardiovascular;  Laterality: N/A;  . ORIF RADIAL HEAD / NECK FRACTURE  09/05/88  . TILT TABLE STUDY N/A 05/15/2013   Procedure: TILT TABLE STUDY;  Surgeon: Duke Salvia, MD;  Location: [facility] CATH LAB;  Service: Cardiovascular;  Laterality: N/A;  . TOTAL HIP ARTHROPLASTY  10/10   Left, at [facility]  . UPPER GASTROINTESTINAL ENDOSCOPY    . VAGINAL HYSTERECTOMY N/A 08/01/2017   Procedure: HYSTERECTOMY VAGINAL;  Surgeon: Carrington Clamp, MD;  Location: [facility];  Service: Gynecology;  Laterality: N/A;    reports that she has never smoked. She has never used smokeless tobacco. She reports current alcohol use. She reports that she does not use drugs. family history includes Asthma in her mother and sister; Diabetes in her brother, mother, and sister; Heart disease in her sister and sister; Lung cancer in her father; Ovarian cancer in her mother; Stroke in her mother. Allergies  Allergen Reactions  . Cyclobenzaprine Other (See Comments)    Pt states that this medication does not work for her.    . Azithromycin Other (See Comments)    Cannot take due to prolonged QT  . Nitrofurantoin Other (See Comments)  . Adhesive [Tape] Itching, Rash and Other (See Comments)    Reaction:  Blisters  Pt states that she is allergic to the adhesive backing of EKG pads if left on for more than 24 hours Patient states she tolerates paper  tape ok  . Moxifloxacin Other (See Comments)    Unknown allergic reaction many years ago  . Quinolones Other (See Comments)    Unknown allergic reaction many years ago   Current Outpatient Medications on File Prior to Visit  Medication Sig Dispense Refill  . ACCU-CHEK FASTCLIX LANCETS MISC USE UP TO 4 TIMES DAILY AS  DIRECTED 408 each 1  . ACCU-CHEK GUIDE test strip USE UP TO 4 TIMES DAILY AS  DIRECTED 100 each 1  . ALPRAZolam (XANAX) 0.5 MG tablet Take 0.5-1 tablets (0.25-0.5 mg total) by mouth 2 (two) times daily as needed for  anxiety. 90 tablet 1  . aspirin EC 81 MG tablet Take 81 mg by mouth daily.    . benzonatate (TESSALON PERLES) 100 MG capsule Take 1 capsule (100 mg total) by mouth 3 (three) times daily as needed for cough. 60 capsule 0  . blood glucose meter kit and supplies KIT Dispense per patient and insurance preferred. Use up to four times daily as directed. E11.9 1 each 0  . cetirizine (ZYRTEC) 10 MG tablet Take 10 mg by mouth daily.    Marland Kitchen dicyclomine (BENTYL) 10 MG capsule TAKE 1 CAPSULE BY MOUTH 4  TIMES DAILY BEFORE MEALS  AND AT BEDTIME 180 capsule 1  . diltiazem (CARDIZEM CD) 240 MG 24 hr capsule TAKE 1 CAPSULE BY MOUTH IN  THE MORNING 90 capsule 3  . diltiazem (CARDIZEM) 30 MG tablet Take 2 tablets (60 mg total) by mouth 4 (four) times daily as needed (as needed for breakthrough heart racing/palpitations). 60 tablet 3  . flecainide (TAMBOCOR) 50 MG tablet Take 1 tablet (50 mg total) by mouth 2 (two) times daily. 180 tablet 3  . furosemide (LASIX) 20 MG tablet Take 2 tablets by mouth for three days.  Then reduce to one tablet by mouth daily. 90 tablet 3  . HYDROcodone-homatropine (HYCODAN) 5-1.5 MG/5ML syrup Take 5 mLs by mouth every 6 (six) hours as needed for cough. 180 mL 0  . losartan (COZAAR) 50 MG tablet Take 1 tablet (50 mg total) by mouth 2 (two) times daily. 180 tablet 3  . nitroGLYCERIN (NITROSTAT) 0.4 MG SL tablet Place 1 tablet (0.4 mg total) under the tongue  every 5 (five) minutes as needed for chest pain. 30 tablet 2  . omeprazole (PRILOSEC) 40 MG capsule Take 1 capsule (40 mg total) by mouth in the morning and at bedtime. 180 capsule 3  . ondansetron (ZOFRAN) 4 MG tablet Take 1 tablet (4 mg total) by mouth every 8 (eight) hours as needed for nausea or vomiting. 20 tablet 1  . promethazine-codeine (PHENERGAN WITH CODEINE) 6.25-10 MG/5ML syrup Take 5 mLs by mouth every 6 (six) hours as needed for cough. 180 mL 0  . zolpidem (AMBIEN) 5 MG tablet TAKE 1 TABLET BY MOUTH AT  BEDTIME AS NEEDED FOR SLEEP 90 tablet 1  . metoprolol tartrate (LOPRESSOR) 50 MG tablet Take 1 tablet (50 mg total) by mouth once for 1 dose. Please take 2 hr prior to CT scan appt 1 tablet 0   No current facility-administered medications on file prior to visit.        ROS:  All others reviewed and negative.  Objective        PE:  BP (!) 156/82   Pulse 64   Temp 98.3 F (36.8 C) (Oral)   Ht $R'5\' 3"'bb$  (1.6 m)   Wt 164 lb (74.4 kg)   SpO2 98%   BMI 29.05 kg/m                 Constitutional: Pt appears in NAD               HENT: Head: NCAT.                Right Ear: External ear normal.                 Left Ear: External ear normal.                Eyes: . Pupils are equal, round, and reactive to light. Conjunctivae and  EOM are normal               Nose: without d/c or deformity               Neck: Neck supple. Gross normal ROM               Cardiovascular: Normal rate and regular rhythm.                 Pulmonary/Chest: Effort normal and breath sounds without rales or wheezing.                Abd:  Soft, NT, ND, + BS, no organomegaly               Neurological: Pt is alert. At baseline orientation, motor grossly intact               Skin: Skin is warm. No rashes, no other new lesions, LE edema - none               Psychiatric: Pt behavior is normal without agitation   Micro: none  Cardiac tracings I have personally interpreted today:  none  Pertinent Radiological  findings (summarize): none   Lab Results  Component Value Date   WBC 6.0 03/31/2020   HGB 13.4 03/31/2020   HCT 42.6 03/31/2020   PLT 276 03/31/2020   GLUCOSE 100 (H) 07/13/2020   CHOL 153 03/05/2019   TRIG 102.0 03/05/2019   HDL 66.50 03/05/2019   LDLCALC 66 03/05/2019   ALT 14 03/31/2020   AST 16 03/31/2020   NA 138 07/13/2020   K 4.2 07/13/2020   CL 104 07/13/2020   CREATININE 0.86 07/13/2020   BUN 12 07/13/2020   CO2 25 07/13/2020   TSH 1.39 03/17/2020   INR 0.9 03/31/2020   HGBA1C 5.8 03/05/2019   Assessment/Plan:  Renee [name] is a 75 y.o. White or Caucasian [1] female with  has a past medical history of Allergy, Anxiety, AV nodal re-entry tachycardia ([place]), Broken neck ([place]), Cancer ([place]), Chronic chest pain, Chronic pain syndrome, Complication of anesthesia, Concussion, Degenerative joint disease, Diet-controlled type 2 diabetes mellitus ([place]), Diverticulosis of colon, Duodenitis without hemorrhage, Dysrhythmia, Fibromyalgia, GERD (gastroesophageal reflux disease), Hemorrhoids, Hypertension, Irritable bowel syndrome, Long QT interval, Myocardial infarction ([place]) (09/1991), Neuropathy, Osteoporosis, Paroxysmal A-fib (HCC), Recurrent upper respiratory infection (URI), SVT (supraventricular tachycardia) ([place]), and Thyroid nodule. Preventative health care Age and sex appropriate education and counseling updated with regular exercise and diet Referrals for preventative services - declines optho referral, has mammogram scheduled Immunizations addressed - none needed Smoking counseling  - none needed Evidence for depression or other mood disorder - none significant Most recent labs reviewed. I have personally reviewed and have noted: 1) the patient's medical and social history 2) The patient's current medications and supplements 3) The patient's height, weight, and BMI have been recorded in the chart   Recurrent UTI Also for urine culture  Essential hypertension BP  Readings from Last 3 Encounters:  07/21/20 (!) 156/82  05/14/20 119/63  04/28/20 (!) 144/84   Stable, pt to continue medical treatment - cardizem, losartan   Hyperglycemia Lab Results  Component Value Date   HGBA1C 5.8 03/05/2019   Stable, pt to continue current medical treatment  - diet   Followup: Return in about 6 months (around 01/18/2021).  Cathlean Cower, MD 07/21/2020 7:59 PM [facility]

## 2020-07-21 NOTE — Progress Notes (Deleted)
Patient ID: Renee [name], female   DOB: [birth year], 75 y.o.   MRN: [id number]         Chief Complaint:: wellness exam and No chief complaint on file.  ***      HPI:  Renee [name] is a 75 y.o. female here for wellness exam   Wt Readings from Last 3 Encounters:  07/21/20 164 lb (74.4 kg)  04/28/20 165 lb 3.2 oz (74.9 kg)  04/07/20 163 lb 3.2 oz (74 kg)   BP Readings from Last 3 Encounters:  07/21/20 (!) 156/82  05/14/20 119/63  04/28/20 (!) 144/84   Immunization History  Administered Date(s) Administered  . Fluad Quad(high Dose 65+) 03/05/2019, 04/30/2020  . Influenza Split 03/14/2011, 04/02/2013  . Influenza Whole 03/11/2009, 03/29/2010  . Influenza, High Dose Seasonal PF 04/30/2015  . Influenza,inj,Quad PF,6+ Mos 06/11/2014, 03/04/2016  . Influenza-Unspecified 03/13/2018  . PFIZER(Purple Top)SARS-COV-2 Vaccination 07/20/2019, 08/10/2019  . Pneumococcal Conjugate-13 12/18/2014  . Pneumococcal Polysaccharide-23 09/07/2015  . Tdap 09/28/2013   Health Maintenance Due  Topic Date Due  . HEMOGLOBIN A1C  09/02/2019         Also ***  Past Medical History:  Diagnosis Date  . Allergy    tape, electrodes  . Anxiety   . AV nodal re-entry tachycardia [facility])    a. s/p RFA 1993- Dr. Delena Serve, [facility]  . Broken neck (HCC)    1993  . Cancer (HCC)    hx of cervical   ca  . Chronic chest pain    a. 2005 Cath: Mild nonobstructive plaque (30-40% LCX);  b. 08/2011 low risk myoview;  c. 09/2011 Coronary CT: NL cors w ca score of 0.  . Chronic pain syndrome   . Complication of anesthesia    blood pressure and heart dropped in 2009    . Concussion    1979, 1993  . Degenerative joint disease    a. s/p L Total Knee Arthroplasty.  . Diet-controlled type 2 diabetes mellitus (HCC)    pt. denies  . Diverticulosis of colon   . Duodenitis without hemorrhage   . Dysrhythmia    REPORTS , SINCE PLACEMENT OF PACEMAKER, STILL CAN FEEL HEART " GOING IN AND OUT OF [facility]" REPORTS, YESTERDAY HER BP  SYSTOLIC WAS IN THE 200s AND WAS BREATHLESS, HR 112, SWEATING ; DENIES SYNCOPE  , DENIES CHEST PAIN   . Fibromyalgia   . GERD (gastroesophageal reflux disease)   . Hemorrhoids   . Hypertension   . Irritable bowel syndrome   . Long QT interval    a. mild - advised to avoid meds that may prolong QT.  Marland Kitchen Myocardial infarction [facility]) 09/1991   does not know when second heart attack was  . Neuropathy   . Osteoporosis   . Paroxysmal A-fib (HCC)    PER PATIENT. I CAN FEEL MY HEART RACING WHEN IM IN IT   . Recurrent upper respiratory infection (URI)   . SVT (supraventricular tachycardia) (HCC)    a. nonsustained SVT - previously offered flecainide but refused;  b. 12/2012 30 day event monitor w/o significant arrhythmias.  . Thyroid nodule    Past Surgical History:  Procedure Laterality Date  . ABLATION OF DYSRHYTHMIC FOCUS  1993  . ANTERIOR AND POSTERIOR REPAIR N/A 08/01/2017   Procedure: ANTERIOR (CYSTOCELE) AND POSTERIOR REPAIR (RECTOCELE);  Surgeon: Alfredo Martinez, MD;  Location: [facility];  Service: Urology;  Laterality: N/A;  . APPENDECTOMY     1980  . BILATERAL SALPINGECTOMY Left 08/01/2017  Procedure: SALPINGECTOMY;  Surgeon: Bobbye Charleston, MD;  Location: [facility];  Service: Gynecology;  Laterality: Left;  . BIOPSY THYROID    . CARDIAC ELECTROPHYSIOLOGY STUDY AND ABLATION     atrioventricular nodal reentant tachycardia  . CHOLECYSTECTOMY  1980  . COLONOSCOPY  06/25/2008   Diverticulosis and Hemorrhoids  . COLPOSCOPY    . CORONARY ANGIOPLASTY  2007  . CYSTOSCOPY N/A 08/01/2017   Procedure: CYSTOSCOPY;  Surgeon: Bjorn Loser, MD;  Location: [facility];  Service: Urology;  Laterality: N/A;  . EP IMPLANTABLE DEVICE N/A 03/03/2016   Procedure: Pacemaker Implant;  Surgeon: Evans Lance, MD;  Location: [place] [facility];  Service: Cardiovascular;  Laterality: N/A;  . ESOPHAGOGASTRODUODENOSCOPY    . HYSTEROSCOPY  2002   with resection of endometrial polyps. by Dr.MCPhail  .  HYSTEROSCOPY WITH D & C N/A 02/05/2015   Procedure: DILATATION AND CURETTAGE /HYSTEROSCOPY;  Surgeon: Vanessa Kick, MD;  Location: [place] [facility];  Service: Gynecology;  Laterality: N/A;  . LOOP RECORDER IMPLANT  08/21/2013   MDT LinQ implanted by Dr Lovena Le for syncope  . LOOP RECORDER IMPLANT N/A 08/21/2013   Procedure: LOOP RECORDER IMPLANT;  Surgeon: Evans Lance, MD;  Location: [facility];  Service: Cardiovascular;  Laterality: N/A;  . ORIF RADIAL HEAD / NECK FRACTURE  09/05/88  . TILT TABLE STUDY N/A 05/15/2013   Procedure: TILT TABLE STUDY;  Surgeon: Deboraha Sprang, MD;  Location: [facility] C[facility] LAB;  Service: Cardiovascular;  Laterality: N/A;  . TOTAL HIP ARTHROPLASTY  10/10   Left, at [facility]    . VAGINAL HYSTERECTOMY N/A 08/01/2017   Procedure: HYSTERECTOMY VAGINAL;  Surgeon: Bobbye Charleston, MD;  Location: [facility];  Service: Gynecology;  Laterality: N/A;    reports that she has never smoked. She has never used smokeless tobacco. She reports current alcohol use. She reports that she does not use drugs. family history includes Asthma in her mother and sister; Diabetes in her brother, mother, and sister; Heart disease in her sister and sister; Lung cancer in her father; Ovarian cancer in her mother; Stroke in her mother. Allergies  Allergen Reactions  . Cyclobenzaprine Other (See Comments)    Pt states that this medication does not work for her.    . Azithromycin Other (See Comments)    Cannot take due to prolonged QT  . Nitrofurantoin Other (See Comments)  . Adhesive [Tape] Itching, Rash and Other (See Comments)    Reaction:  Blisters  Pt states that she is allergic to the adhesive backing of EKG pads if left on for more than 24 hours Patient states she tolerates paper tape ok  . Moxifloxacin Other (See Comments)    Unknown allergic reaction many years ago  . Quinolones Other (See Comments)    Unknown allergic reaction many years ago   Current  Outpatient Medications on File Prior to Visit  Medication Sig Dispense Refill  . ACCU-CHEK FASTCLIX LANCETS MISC USE UP TO 4 TIMES DAILY AS  DIRECTED 408 each 1  . ACCU-CHEK GUIDE test strip USE UP TO 4 TIMES DAILY AS  DIRECTED 100 each 1  . ALPRAZolam (XANAX) 0.5 MG tablet Take 0.5-1 tablets (0.25-0.5 mg total) by mouth 2 (two) times daily as needed for anxiety. 90 tablet 1  . aspirin EC 81 MG tablet Take 81 mg by mouth daily.    . benzonatate (TESSALON PERLES) 100 MG capsule Take 1 capsule (100 mg total) by mouth 3 (three) times  daily as needed for cough. 60 capsule 0  . blood glucose meter kit and supplies KIT Dispense per patient and insurance preferred. Use up to four times daily as directed. E11.9 1 each 0  . cetirizine (ZYRTEC) 10 MG tablet Take 10 mg by mouth daily.    Marland Kitchen dicyclomine (BENTYL) 10 MG capsule TAKE 1 CAPSULE BY MOUTH 4  TIMES DAILY BEFORE MEALS  AND AT BEDTIME 180 capsule 1  . diltiazem (CARDIZEM CD) 240 MG 24 hr capsule TAKE 1 CAPSULE BY MOUTH IN  THE MORNING 90 capsule 3  . diltiazem (CARDIZEM) 30 MG tablet Take 2 tablets (60 mg total) by mouth 4 (four) times daily as needed (as needed for breakthrough heart racing/palpitations). 60 tablet 3  . flecainide (TAMBOCOR) 50 MG tablet Take 1 tablet (50 mg total) by mouth 2 (two) times daily. 180 tablet 3  . furosemide (LASIX) 20 MG tablet Take 2 tablets by mouth for three days.  Then reduce to one tablet by mouth daily. 90 tablet 3  . HYDROcodone-homatropine (HYCODAN) 5-1.5 MG/5ML syrup Take 5 mLs by mouth every 6 (six) hours as needed for cough. 180 mL 0  . losartan (COZAAR) 50 MG tablet Take 1 tablet (50 mg total) by mouth 2 (two) times daily. 180 tablet 3  . nitroGLYCERIN (NITROSTAT) 0.4 MG SL tablet Place 1 tablet (0.4 mg total) under the tongue every 5 (five) minutes as needed for chest pain. 30 tablet 2  . omeprazole (PRILOSEC) 40 MG capsule Take 1 capsule (40 mg total) by mouth in the morning and at bedtime. 180 capsule 3  .  ondansetron (ZOFRAN) 4 MG tablet Take 1 tablet (4 mg total) by mouth every 8 (eight) hours as needed for nausea or vomiting. 20 tablet 1  . promethazine-codeine (PHENERGAN WITH CODEINE) 6.25-10 MG/5ML syrup Take 5 mLs by mouth every 6 (six) hours as needed for cough. 180 mL 0  . zolpidem (AMBIEN) 5 MG tablet TAKE 1 TABLET BY MOUTH AT  BEDTIME AS NEEDED FOR SLEEP 90 tablet 1  . metoprolol tartrate (LOPRESSOR) 50 MG tablet Take 1 tablet (50 mg total) by mouth once for 1 dose. Please take 2 hr prior to CT scan appt 1 tablet 0   No current facility-administered medications on file prior to visit.        ROS:  All others reviewed and negative.  Objective        PE:  BP (!) 156/82   Pulse 64   Temp 98.3 F (36.8 C) (Oral)   Ht $R'5\' 3"'DX$  (1.6 m)   Wt 164 lb (74.4 kg)   SpO2 98%   BMI 29.05 kg/m                 Constitutional: Pt appears in NAD               HENT: Head: NCAT.                Right Ear: External ear normal.                 Left Ear: External ear normal.                Eyes: . Pupils are equal, round, and reactive to light. Conjunctivae and EOM are normal               Nose: without d/c or deformity               Neck: Neck  supple. Gross normal ROM               Cardiovascular: Normal rate and regular rhythm.                 Pulmonary/Chest: Effort normal and breath sounds without rales or wheezing.                Abd:  Soft, NT, ND, + BS, no organomegaly               Neurological: Pt is alert. At baseline orientation, motor grossly intact               Skin: Skin is warm. No rashes, no other new lesions, LE edema - ***               Psychiatric: Pt behavior is normal without agitation   Micro: none  Cardiac tracings I have personally interpreted today:  none  Pertinent Radiological findings (summarize): none   Lab Results  Component Value Date   WBC 6.0 03/31/2020   HGB 13.4 03/31/2020   HCT 42.6 03/31/2020   PLT 276 03/31/2020   GLUCOSE 100 (H) 07/13/2020   CHOL  153 03/05/2019   TRIG 102.0 03/05/2019   HDL 66.50 03/05/2019   LDLCALC 66 03/05/2019   ALT 14 03/31/2020   AST 16 03/31/2020   NA 138 07/13/2020   K 4.2 07/13/2020   CL 104 07/13/2020   CREATININE 0.86 07/13/2020   BUN 12 07/13/2020   CO2 25 07/13/2020   TSH 1.39 03/17/2020   INR 0.9 03/31/2020   HGBA1C 5.8 03/05/2019   Assessment/Plan:  NEELA ZECCA is a 75 y.o. White or Caucasian [1] female with  has a past medical history of Allergy, Anxiety, AV nodal re-entry tachycardia ([place]), Broken neck ([place]), Cancer ([place]), Chronic chest pain, Chronic pain syndrome, Complication of anesthesia, Concussion, Degenerative joint disease, Diet-controlled type 2 diabetes mellitus ([place]), Diverticulosis of colon, Duodenitis without hemorrhage, Dysrhythmia, Fibromyalgia, GERD (gastroesophageal reflux disease), Hemorrhoids, Hypertension, Irritable bowel syndrome, Long QT interval, Myocardial infarction ([place]) (09/1991), Neuropathy, Osteoporosis, Paroxysmal A-fib (HCC), Recurrent upper respiratory infection (URI), SVT (supraventricular tachycardia) ([place]), and Thyroid nodule. No problem-specific Assessment & Plan notes found for this encounter.  Followup: No follow-ups on file.  Cathlean Cower, MD 07/21/2020 4:52 PM [facility]

## 2020-07-22 ENCOUNTER — Encounter: Payer: Self-pay | Admitting: Internal Medicine

## 2020-07-22 ENCOUNTER — Other Ambulatory Visit: Payer: Self-pay | Admitting: Internal Medicine

## 2020-07-22 DIAGNOSIS — E0590 Thyrotoxicosis, unspecified without thyrotoxic crisis or storm: Secondary | ICD-10-CM

## 2020-07-22 LAB — URINE CULTURE
MICRO NUMBER:: 11508520
SPECIMEN QUALITY:: ADEQUATE

## 2020-07-22 LAB — HEPATIC FUNCTION PANEL
ALT: 12 U/L (ref 0–35)
AST: 11 U/L (ref 0–37)
Albumin: 4.3 g/dL (ref 3.5–5.2)
Alkaline Phosphatase: 107 U/L (ref 39–117)
Bilirubin, Direct: 0.2 mg/dL (ref 0.0–0.3)
Total Bilirubin: 0.9 mg/dL (ref 0.2–1.2)
Total Protein: 7 g/dL (ref 6.0–8.3)

## 2020-07-22 LAB — BASIC METABOLIC PANEL
BUN: 18 mg/dL (ref 6–23)
CO2: 27 mEq/L (ref 19–32)
Calcium: 9.5 mg/dL (ref 8.4–10.5)
Chloride: 102 mEq/L (ref 96–112)
Creatinine, Ser: 0.81 mg/dL (ref 0.40–1.20)
GFR: 71.25 mL/min (ref >60.00)
Glucose, Bld: 94 mg/dL (ref 70–99)
Potassium: 3.9 mEq/L (ref 3.5–5.1)
Sodium: 138 mEq/L (ref 135–145)

## 2020-07-22 LAB — LIPID PANEL
Cholesterol: 175 mg/dL (ref 0–200)
HDL: 69.3 mg/dL (ref >39.00)
LDL Cholesterol: 83 mg/dL (ref 0–99)
NonHDL: 105.84
Total CHOL/HDL Ratio: 3
Triglycerides: 114 mg/dL (ref 0.0–149.0)
VLDL: 22.8 mg/dL (ref 0.0–40.0)

## 2020-07-22 LAB — URINALYSIS, ROUTINE W REFLEX MICROSCOPIC
Bilirubin Urine: NEGATIVE
Hgb urine dipstick: NEGATIVE
Ketones, ur: NEGATIVE
Leukocytes,Ua: NEGATIVE
Nitrite: NEGATIVE
RBC / HPF: NONE SEEN (ref 0–?)
Specific Gravity, Urine: 1.02 (ref 1.000–1.030)
Total Protein, Urine: NEGATIVE
Urine Glucose: NEGATIVE
Urobilinogen, UA: 0.2 (ref 0.0–1.0)
pH: 7 (ref 5.0–8.0)

## 2020-07-22 LAB — TSH: TSH: 0.29 u[IU]/mL — ABNORMAL LOW (ref 0.35–4.50)

## 2020-07-22 LAB — VITAMIN D 25 HYDROXY (VIT D DEFICIENCY, FRACTURES): VITD: 23.63 ng/mL — ABNORMAL LOW (ref 30.00–100.00)

## 2020-07-22 LAB — HEMOGLOBIN A1C: Hgb A1c MFr Bld: 6.2 % (ref 4.6–6.5)

## 2020-07-22 LAB — VITAMIN B12: Vitamin B-12: 251 pg/mL (ref 211–911)

## 2020-07-26 DIAGNOSIS — L539 Erythematous condition, unspecified: Secondary | ICD-10-CM | POA: Diagnosis not present

## 2020-07-26 DIAGNOSIS — K219 Gastro-esophageal reflux disease without esophagitis: Secondary | ICD-10-CM | POA: Diagnosis not present

## 2020-07-26 DIAGNOSIS — L03818 Cellulitis of other sites: Secondary | ICD-10-CM | POA: Diagnosis not present

## 2020-07-26 DIAGNOSIS — I2510 Atherosclerotic heart disease of native coronary artery without angina pectoris: Secondary | ICD-10-CM | POA: Diagnosis not present

## 2020-07-26 DIAGNOSIS — L03116 Cellulitis of left lower limb: Secondary | ICD-10-CM | POA: Diagnosis not present

## 2020-07-26 DIAGNOSIS — M25552 Pain in left hip: Secondary | ICD-10-CM | POA: Diagnosis not present

## 2020-07-26 DIAGNOSIS — Z96643 Presence of artificial hip joint, bilateral: Secondary | ICD-10-CM | POA: Diagnosis not present

## 2020-07-26 DIAGNOSIS — R7989 Other specified abnormal findings of blood chemistry: Secondary | ICD-10-CM | POA: Diagnosis not present

## 2020-07-26 DIAGNOSIS — Z7982 Long term (current) use of aspirin: Secondary | ICD-10-CM | POA: Diagnosis not present

## 2020-07-26 DIAGNOSIS — Z79899 Other long term (current) drug therapy: Secondary | ICD-10-CM | POA: Diagnosis not present

## 2020-07-26 DIAGNOSIS — Z96642 Presence of left artificial hip joint: Secondary | ICD-10-CM | POA: Diagnosis not present

## 2020-07-26 DIAGNOSIS — M25452 Effusion, left hip: Secondary | ICD-10-CM | POA: Diagnosis not present

## 2020-07-26 DIAGNOSIS — Z471 Aftercare following joint replacement surgery: Secondary | ICD-10-CM | POA: Diagnosis not present

## 2020-07-26 DIAGNOSIS — I10 Essential (primary) hypertension: Secondary | ICD-10-CM | POA: Diagnosis not present

## 2020-07-27 ENCOUNTER — Telehealth: Payer: Self-pay | Admitting: Student

## 2020-07-27 DIAGNOSIS — Z471 Aftercare following joint replacement surgery: Secondary | ICD-10-CM | POA: Diagnosis not present

## 2020-07-27 DIAGNOSIS — Z96642 Presence of left artificial hip joint: Secondary | ICD-10-CM | POA: Diagnosis not present

## 2020-07-27 DIAGNOSIS — M25552 Pain in left hip: Secondary | ICD-10-CM | POA: Diagnosis not present

## 2020-07-27 NOTE — Telephone Encounter (Signed)
Spoke with patient she states her monitor has a battery issue and was given # to [employer] as she didn't have time for me to do it for her. Pt will call back

## 2020-07-27 NOTE — Telephone Encounter (Signed)
[employer] alert for no communication x 21 days.   Pt doesn't think there's any issue with her monitor. She will check when she gets home and let us know.   Legrand Como [address] [place], PA-C  07/27/2020 9:12 AM

## 2020-07-29 DIAGNOSIS — M542 Cervicalgia: Secondary | ICD-10-CM | POA: Diagnosis not present

## 2020-07-29 DIAGNOSIS — M62838 Other muscle spasm: Secondary | ICD-10-CM | POA: Diagnosis not present

## 2020-07-29 DIAGNOSIS — M25511 Pain in right shoulder: Secondary | ICD-10-CM | POA: Diagnosis not present

## 2020-07-29 DIAGNOSIS — I2510 Atherosclerotic heart disease of native coronary artery without angina pectoris: Secondary | ICD-10-CM | POA: Diagnosis not present

## 2020-07-29 DIAGNOSIS — I10 Essential (primary) hypertension: Secondary | ICD-10-CM | POA: Diagnosis not present

## 2020-07-29 DIAGNOSIS — Z79899 Other long term (current) drug therapy: Secondary | ICD-10-CM | POA: Diagnosis not present

## 2020-07-29 DIAGNOSIS — Z7982 Long term (current) use of aspirin: Secondary | ICD-10-CM | POA: Diagnosis not present

## 2020-07-29 DIAGNOSIS — M79601 Pain in right arm: Secondary | ICD-10-CM | POA: Diagnosis not present

## 2020-07-30 NOTE — Telephone Encounter (Signed)
Confirmed data received today 07/30/20.

## 2020-07-31 LAB — HM PAP SMEAR: HM Pap smear: NORMAL

## 2020-08-10 ENCOUNTER — Ambulatory Visit (INDEPENDENT_AMBULATORY_CARE_PROVIDER_SITE_OTHER): Payer: Medicare Other

## 2020-08-10 DIAGNOSIS — R55 Syncope and collapse: Secondary | ICD-10-CM | POA: Diagnosis not present

## 2020-08-10 LAB — CUP PACEART REMOTE DEVICE CHECK
Date Time Interrogation Session: 20220227113905
Implantable Lead Implant Date: 20170921
Implantable Lead Implant Date: 20170921
Implantable Lead Location: 753859
Implantable Lead Location: 753860
Implantable Lead Model: 377
Implantable Lead Model: 377
Implantable Lead Serial Number: 49575519
Implantable Lead Serial Number: 49580836
Implantable Pulse Generator Implant Date: 20170921
Pulse Gen Model: 407145
Pulse Gen Serial Number: 68850891

## 2020-08-11 ENCOUNTER — Ambulatory Visit (HOSPITAL_COMMUNITY)
Admission: RE | Admit: 2020-08-11 | Discharge: 2020-08-11 | Disposition: A | Discharge status: Home / Self Care | Payer: Medicare Other | Source: Ambulatory Visit | Attending: Cardiology | Admitting: Cardiology

## 2020-08-11 ENCOUNTER — Other Ambulatory Visit (HOSPITAL_COMMUNITY): Payer: Self-pay | Admitting: Cardiology

## 2020-08-11 ENCOUNTER — Encounter (HOSPITAL_COMMUNITY): Payer: Self-pay | Admitting: Cardiology

## 2020-08-11 ENCOUNTER — Other Ambulatory Visit: Payer: Self-pay

## 2020-08-11 VITALS — BP 124/78 | HR 74 | Wt 168.4 lb

## 2020-08-11 DIAGNOSIS — I5032 Chronic diastolic (congestive) heart failure: Secondary | ICD-10-CM | POA: Insufficient documentation

## 2020-08-11 DIAGNOSIS — R079 Chest pain, unspecified: Secondary | ICD-10-CM | POA: Diagnosis not present

## 2020-08-11 DIAGNOSIS — E041 Nontoxic single thyroid nodule: Secondary | ICD-10-CM | POA: Insufficient documentation

## 2020-08-11 DIAGNOSIS — I480 Paroxysmal atrial fibrillation: Secondary | ICD-10-CM | POA: Insufficient documentation

## 2020-08-11 DIAGNOSIS — Z7982 Long term (current) use of aspirin: Secondary | ICD-10-CM | POA: Insufficient documentation

## 2020-08-11 DIAGNOSIS — Z8249 Family history of ischemic heart disease and other diseases of the circulatory system: Secondary | ICD-10-CM | POA: Insufficient documentation

## 2020-08-11 DIAGNOSIS — I4581 Long QT syndrome: Secondary | ICD-10-CM | POA: Diagnosis not present

## 2020-08-11 DIAGNOSIS — Z79899 Other long term (current) drug therapy: Secondary | ICD-10-CM | POA: Insufficient documentation

## 2020-08-11 DIAGNOSIS — I471 Supraventricular tachycardia: Secondary | ICD-10-CM | POA: Diagnosis not present

## 2020-08-11 DIAGNOSIS — I5022 Chronic systolic (congestive) heart failure: Secondary | ICD-10-CM

## 2020-08-11 DIAGNOSIS — R55 Syncope and collapse: Secondary | ICD-10-CM | POA: Diagnosis not present

## 2020-08-11 DIAGNOSIS — I110 Hypertensive heart disease with heart failure: Secondary | ICD-10-CM | POA: Insufficient documentation

## 2020-08-11 HISTORY — DX: Heart failure, unspecified: I50.9

## 2020-08-11 LAB — TSH: TSH: 1.134 u[IU]/mL (ref 0.350–4.500)

## 2020-08-11 LAB — T4, FREE: Free T4: 0.92 ng/dL (ref 0.61–1.12)

## 2020-08-11 MED ORDER — TRAZODONE HCL 50 MG PO TABS: 50.0000 mg | ORAL_TABLET | Freq: Every evening | ORAL | 3 refills | Status: DC | PRN

## 2020-08-11 NOTE — Patient Instructions (Addendum)
EKG done today.  Labs done today. We will contact you only if your labs are abnormal.  No medication changes were made. Please continue all current medications as prescribed.  Your physician recommends that you schedule a follow-up appointment in: 6 months. Please contact our office in August to schedule a September appointment.   If you have any questions or concerns before your next appointment please send [place] a message through [place] or call our office at [phone].    TO LEAVE A MESSAGE FOR THE NURSE SELECT OPTION 2, PLEASE LEAVE A MESSAGE INCLUDING: . YOUR NAME . DATE OF BIRTH . CALL BACK NUMBER . REASON FOR CALL**this is important as we prioritize the call backs  YOU WILL RECEIVE A CALL BACK THE SAME DAY AS LONG AS YOU CALL BEFORE 4:00 PM   Do the following things EVERYDAY: 1) Weigh yourself in the morning before breakfast. Write it down and keep it in a log. 2) Take your medicines as prescribed 3) Eat low salt foods--Limit salt (sodium) to 2000 mg per day.  4) Stay as active as you can everyday 5) Limit all fluids for the day to less than 2 liters   At the [facility], you and your health needs are our priority. As part of our continuing mission to provide you with exceptional heart care, we have created designated Provider Care Teams. These Care Teams include your primary Cardiologist (physician) and Advanced Practice Providers (APPs- Physician Assistants and Nurse Practitioners) who all work together to provide you with the care you need, when you need it.   You may see any of the following providers on your designated Care Team at your next follow up: Marland Kitchen Dr Glori Bickers . Dr Loralie Champagne . Darrick Grinder, NP . Lyda Jester, PA . Audry Riles, PharmD   Please be sure to bring in all your medications bottles to every appointment.

## 2020-08-12 LAB — T3, FREE: T3, Free: 3.6 pg/mL (ref 2.0–4.4)
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
## 2020-08-17 ENCOUNTER — Encounter: Payer: Self-pay | Admitting: Internal Medicine

## 2020-08-17 NOTE — Progress Notes (Signed)
Remote pacemaker transmission.   

## 2020-08-20 ENCOUNTER — Ambulatory Visit (INDEPENDENT_AMBULATORY_CARE_PROVIDER_SITE_OTHER): Payer: Medicare Other | Admitting: Internal Medicine

## 2020-08-20 ENCOUNTER — Encounter: Payer: Self-pay | Admitting: Internal Medicine

## 2020-08-20 ENCOUNTER — Other Ambulatory Visit: Payer: Self-pay

## 2020-08-20 DIAGNOSIS — L03116 Cellulitis of left lower limb: Secondary | ICD-10-CM | POA: Diagnosis not present

## 2020-08-20 MED ORDER — SULFAMETHOXAZOLE-TRIMETHOPRIM 800-160 MG PO TABS: 1.0000 | ORAL_TABLET | Freq: Two times a day (BID) | ORAL | 0 refills | Status: AC

## 2020-08-20 NOTE — Patient Instructions (Signed)
We have sent in bactrim to take 1 pill twice a day for 2 weeks. Then come back to make sure this is healing.

## 2020-08-20 NOTE — Progress Notes (Unsigned)
   Subjective:   Patient ID: Renee [name], female    DOB: 12/03/1945, 75 y.o.   MRN: [id number]  HPI The patient is a 75 YO female coming in for cellulitis on the left hip in the area of her past scar from hip replacement. The hip replacement was years ago. About 1 month or so ago she got a rash there and went to urgent care. She was given 1 week keflex and this cleared the rash. The rash did return within 1 day off medication. She then went back and was given another 1 week course of keflex and she is still taking that. She states that the area is shrinking some but not gone and only has 3 pills left. She is concerned about abscess or MRSA. She did have x-ray at the initial urgent care encounter and this looked superficial without concern for spread to the hip.   Review of Systems  Constitutional: Negative.   HENT: Negative.   Eyes: Negative.   Respiratory: Negative for cough, chest tightness and shortness of breath.   Cardiovascular: Negative for chest pain, palpitations and leg swelling.  Gastrointestinal: Negative for abdominal distention, abdominal pain, constipation, diarrhea, nausea and vomiting.  Musculoskeletal: Negative.   Skin: Positive for rash.  Neurological: Negative.   Psychiatric/Behavioral: Negative.     Objective:  Physical Exam Constitutional:      Appearance: She is well-developed.  HENT:     Head: Normocephalic and atraumatic.  Cardiovascular:     Rate and Rhythm: Normal rate and regular rhythm.  Pulmonary:     Effort: Pulmonary effort is normal. No respiratory distress.     Breath sounds: Normal breath sounds. No wheezing or rales.  Abdominal:     General: Bowel sounds are normal. There is no distension.     Palpations: Abdomen is soft.     Tenderness: There is no abdominal tenderness. There is no rebound.  Musculoskeletal:     Cervical back: Normal range of motion.  Skin:    General: Skin is warm and dry.     Findings: Rash present.     Comments:  Cellulitis appearing rash left lateral thigh over scar from past hip replacement  Neurological:     Mental Status: She is alert and oriented to person, place, and time.     Coordination: Coordination normal.     Vitals:   08/20/20 1530  BP: 122/70  Pulse: 76  Resp: 18  Temp: 98.2 F (36.8 C)  TempSrc: Oral  SpO2: 97%  Weight: 165 lb 6.4 oz (75 kg)  Height: 5\' 3"  (1.6 m)    This visit occurred during the SARS-CoV-2 public health emergency.  Safety protocols were in place, including screening questions prior to the visit, additional usage of staff PPE, and extensive cleaning of exam room while observing appropriate contact time as indicated for disinfecting solutions.   Assessment & Plan:  Visit time 25 minutes in face to face communication with patient and coordination of care, additional 10 minutes spent in record review, coordination or care, ordering tests, communicating/referring to other healthcare professionals, documenting in medical records all on the same day of the visit for total time 35 minutes spent on the visit.

## 2020-08-21 ENCOUNTER — Encounter: Payer: Self-pay | Admitting: Internal Medicine

## 2020-08-21 DIAGNOSIS — L0390 Cellulitis, unspecified: Secondary | ICD-10-CM | POA: Insufficient documentation

## 2020-08-21 NOTE — Assessment & Plan Note (Signed)
The timeline of story is not easy to follow. Per her dispense report she did fill keflex on 07/26/20 for 1 week supply and again on 08/01/20 for a 1 week supply however the bottle she has with her which has 3 pills remaining is from 08/01/20 which is about 20 days ago so either she did not start taking right away or has not been taking appropriately. Will change to bactrim and 2 week course for recurrent cellulitis and needs visit with provider in 10-14 days for recheck and adjustment of therapy if needed.

## 2020-08-26 ENCOUNTER — Telehealth: Payer: Self-pay | Admitting: Internal Medicine

## 2020-08-26 NOTE — Telephone Encounter (Signed)
See below

## 2020-08-26 NOTE — Telephone Encounter (Signed)
Patient called and said that she cannot take sulfamethoxazole-trimethoprim (BACTRIM DS) 800-160 MG tablet because of long qt. She said her pharmacist said that she should not take it and was wondering if something else could be called into [place], [place] - [address]. She seen Dr. Sharlet Salina on 08/20/20. Please advise.

## 2020-08-27 ENCOUNTER — Encounter: Payer: Self-pay | Admitting: Internal Medicine

## 2020-08-27 NOTE — Telephone Encounter (Signed)
She does not have long QT on most recent EKG so bactrim should be safe for her. Additionally this is now almost a week after this medication was prescribed so it may be best for her to have a visit to reassess her infection since it has gone untreated for a week to make sure she does not need different treatment.

## 2020-08-27 NOTE — Telephone Encounter (Signed)
Patient calling requesting a call back

## 2020-08-28 NOTE — Telephone Encounter (Signed)
Unable to get in contact with the patient. LDVM with recommendations from Dr. Sharlet Salina. Patient has a scheduled follow up visit with Dr. Sharlet Salina for 09/01/2020.

## 2020-09-01 ENCOUNTER — Ambulatory Visit: Payer: Medicare Other | Admitting: Internal Medicine

## 2020-09-01 ENCOUNTER — Telehealth: Payer: Self-pay

## 2020-09-01 MED ORDER — NITROGLYCERIN 0.4 MG SL SUBL: 0.4000 mg | SUBLINGUAL_TABLET | SUBLINGUAL | 5 refills | Status: DC | PRN

## 2020-09-01 NOTE — Telephone Encounter (Signed)
OK to give NTG.  Did not have epicardial CAD on recent coronary CT.

## 2020-09-01 NOTE — Telephone Encounter (Signed)
Patient called and reports Saturday she was driving and experienced an "angina attack." States she has a long hx of this. States she does not have Nitro and needs a refill. Patient currently denies any symptoms. Advised if she had symptoms that persist or different to go to ED. Verbalized understanding. Advised I would send Dr. Aundra Dubin RN a message.   Patient needs to be scheduled with [facility] d/t recall. Sending to scheduling to advise.

## 2020-09-01 NOTE — Telephone Encounter (Signed)
The patient states she felt like she was having Chest pains again. She also asked for a copy of the transmission. I let her speak with device nurse Leigh, rn. She states she needs another refill of nitro.

## 2020-09-01 NOTE — Telephone Encounter (Signed)
Meds ordered this encounter  Medications   nitroGLYCERIN (NITROSTAT) 0.4 MG SL tablet    Sig: Place 1 tablet (0.4 mg total) under the tongue every 5 (five) minutes as needed for chest pain.    Dispense:  30 tablet    Refill:  5   Rx sent,pt advised

## 2020-09-01 NOTE — Telephone Encounter (Signed)
See below, fyi

## 2020-09-02 ENCOUNTER — Encounter: Payer: Self-pay | Admitting: Internal Medicine

## 2020-09-02 ENCOUNTER — Ambulatory Visit (INDEPENDENT_AMBULATORY_CARE_PROVIDER_SITE_OTHER): Payer: Medicare Other | Admitting: Internal Medicine

## 2020-09-02 ENCOUNTER — Other Ambulatory Visit: Payer: Self-pay

## 2020-09-02 DIAGNOSIS — M25552 Pain in left hip: Secondary | ICD-10-CM | POA: Diagnosis not present

## 2020-09-02 DIAGNOSIS — R739 Hyperglycemia, unspecified: Secondary | ICD-10-CM

## 2020-09-02 DIAGNOSIS — N3281 Overactive bladder: Secondary | ICD-10-CM | POA: Insufficient documentation

## 2020-09-02 DIAGNOSIS — I10 Essential (primary) hypertension: Secondary | ICD-10-CM | POA: Diagnosis not present

## 2020-09-02 DIAGNOSIS — N870 Mild cervical dysplasia: Secondary | ICD-10-CM | POA: Insufficient documentation

## 2020-09-02 DIAGNOSIS — N950 Postmenopausal bleeding: Secondary | ICD-10-CM | POA: Insufficient documentation

## 2020-09-02 DIAGNOSIS — N951 Menopausal and female climacteric states: Secondary | ICD-10-CM | POA: Insufficient documentation

## 2020-09-02 NOTE — Assessment & Plan Note (Signed)
No evidence for cellulitis currently but has area of subq fluid collection I suspect cystic like, will likely need minor surgical interveniton for resolution - for ortho referral [facility],  to f/u any worsening symptoms or concerns

## 2020-09-02 NOTE — Patient Instructions (Signed)
Please continue all other medications as before, and refills have been done if requested.  Please have the pharmacy call with any other refills you may need.  Please continue your efforts at being more active, low cholesterol diet, and weight control.  Please keep your appointments with your specialists as you may have planned  You will be contacted regarding the referral for: [facility]o - [facility]

## 2020-09-02 NOTE — Assessment & Plan Note (Signed)
Lab Results  Component Value Date   HGBA1C 6.2 07/21/2020   Stable, pt to continue current medical treatment  - diet and wt control

## 2020-09-02 NOTE — Progress Notes (Signed)
Patient ID: Renee [name], female   DOB: 1946-02-20, 75 y.o.   MRN: [id number]        Chief Complaint: follow up left lateral hip cellulitis       HPI:  Renee [name] is a 75 y.o. female here with recent tx for left hip cellulitis at least ongoing since early feb 2022, seen at [facility] then per Dr Sharlet Salina with keflex course x 2 (pt did not take the bactrim due to concern about long QT); here to f/u now with what appears to be a fluid collection subq at the proximal aspect of the prior surgical site left lateral hip,, concerned about returning cellulitis as it does seem somewhat indurated and tender but no redness, swelling, red streaks or dranage.  Pt denies chest pain, increased sob or doe, wheezing, orthopnea, PND, increased LE swelling, palpitations, dizziness or syncope.   Pt denies polydipsia, polyuria, Denies new worsening focal neuro s/s.         Wt Readings from Last 3 Encounters:  09/02/20 166 lb (75.3 kg)  08/20/20 165 lb 6.4 oz (75 kg)  08/11/20 168 lb 6.4 oz (76.4 kg)   BP Readings from Last 3 Encounters:  09/02/20 124/78  08/20/20 122/70  08/11/20 124/78         Past Medical History:  Diagnosis Date  . Allergy    tape, electrodes  . Anxiety   . AV nodal re-entry tachycardia [facility])    a. s/p RFA 1993- Dr. Rolland Porter, [place]  . Broken neck ([place])    1993  . Cancer (HCC)    hx of cervical   ca  . CHF (congestive heart failure) ([place])   . Chronic chest pain    a. 2005 Cath: Mild nonobstructive plaque (30-40% LCX);  b. 08/2011 low risk myoview;  c. 09/2011 Coronary CT: NL cors w ca score of 0.  . Chronic pain syndrome   . Complication of anesthesia    blood pressure and heart dropped in 2009    . Concussion    1979, 1993  . Degenerative joint disease    a. s/p L Total Knee Arthroplasty.  . Diet-controlled type 2 diabetes mellitus ([name])    pt. denies  . Diverticulosis of colon   . Duodenitis without hemorrhage   . Dysrhythmia    REPORTS , SINCE PLACEMENT OF PACEMAKER, STILL CAN  FEEL HEART " GOING IN AND OUT OF RYTHYN" REPORTS, YESTERDAY HER BP SYSTOLIC WAS IN THE 408X AND WAS BREATHLESS, HR 112, SWEATING ; DENIES SYNCOPE  , DENIES CHEST PAIN   . Fibromyalgia   . GERD (gastroesophageal reflux disease)   . Hemorrhoids   . Hypertension   . Irritable bowel syndrome   . Long QT interval    a. mild - advised to avoid meds that may prolong QT.  Marland Kitchen Myocardial infarction [facility]) 09/1991   does not know when second heart attack was  . Neuropathy   . Osteoporosis   . Paroxysmal A-fib (HCC)    PER PATIENT. I CAN FEEL MY HEART RACING WHEN IM IN IT   . Recurrent upper respiratory infection (URI)   . SVT (supraventricular tachycardia) (HCC)    a. nonsustained SVT - previously offered flecainide but refused;  b. 12/2012 30 day event monitor w/o significant arrhythmias.  . Thyroid nodule    Past Surgical History:  Procedure Laterality Date  . ABLATION OF DYSRHYTHMIC FOCUS  1993  . ANTERIOR AND POSTERIOR REPAIR N/A 08/01/2017   Procedure: ANTERIOR (CYSTOCELE)  AND POSTERIOR REPAIR (RECTOCELE);  Surgeon: Bjorn Loser, MD;  Location: [facility];  Service: Urology;  Laterality: N/A;  . APPENDECTOMY     1980  . BILATERAL SALPINGECTOMY Left 08/01/2017   Procedure: SALPINGECTOMY;  Surgeon: Bobbye Charleston, MD;  Location: [facility];  Service: Gynecology;  Laterality: Left;  . BIOPSY THYROID    . CARDIAC ELECTROPHYSIOLOGY STUDY AND ABLATION     atrioventricular nodal reentant tachycardia  . CHOLECYSTECTOMY  1980  . COLONOSCOPY  06/25/2008   Diverticulosis and Hemorrhoids  . COLPOSCOPY    . CORONARY ANGIOPLASTY  2007  . CYSTOSCOPY N/A 08/01/2017   Procedure: CYSTOSCOPY;  Surgeon: Bjorn Loser, MD;  Location: [facility];  Service: Urology;  Laterality: N/A;  . EP IMPLANTABLE DEVICE N/A 03/03/2016   Procedure: Pacemaker Implant;  Surgeon: Evans Lance, MD;  Location: [facility];  Service: Cardiovascular;  Laterality: N/A;  . ESOPHAGOGASTRODUODENOSCOPY    . HYSTEROSCOPY  2002    with resection of endometrial polyps. by Dr.MCPhail  . HYSTEROSCOPY WITH D & C N/A 02/05/2015   Procedure: DILATATION AND CURETTAGE /HYSTEROSCOPY;  Surgeon: Vanessa Kick, MD;  Location: [facility];  Service: Gynecology;  Laterality: N/A;  . LOOP RECORDER IMPLANT  08/21/2013   MDT LinQ implanted by Dr Lovena Le for syncope  . LOOP RECORDER IMPLANT N/A 08/21/2013   Procedure: LOOP RECORDER IMPLANT;  Surgeon: Evans Lance, MD;  Location: [facility] CATH LAB;  Service: Cardiovascular;  Laterality: N/A;  . ORIF RADIAL HEAD / NECK FRACTURE  09/05/88  . TILT TABLE STUDY N/A 05/15/2013   Procedure: TILT TABLE STUDY;  Surgeon: Deboraha Sprang, MD;  Location: [facility] CATH LAB;  Service: Cardiovascular;  Laterality: N/A;  . TOTAL HIP ARTHROPLASTY  10/10   Left, at [place]    . VAGINAL HYSTERECTOMY N/A 08/01/2017   Procedure: HYSTERECTOMY VAGINAL;  Surgeon: Bobbye Charleston, MD;  Location: [facility];  Service: Gynecology;  Laterality: N/A;    reports that she has never smoked. She has never used smokeless tobacco. She reports current alcohol use. She reports that she does not use drugs. family history includes Asthma in her mother and sister; Diabetes in her brother, mother, and sister; Heart disease in her sister and sister; Lung cancer in her father; Ovarian cancer in her mother; Stroke in her mother. Allergies  Allergen Reactions  . Cyclobenzaprine Other (See Comments)    Pt states that this medication does not work for her.    . Azithromycin Other (See Comments)    Cannot take due to prolonged QT  . Nitrofurantoin Other (See Comments)  . Adhesive [Tape] Itching, Rash and Other (See Comments)    Reaction:  Blisters  Pt states that she is allergic to the adhesive backing of EKG pads if left on for more than 24 hours Patient states she tolerates paper tape ok  . Moxifloxacin Other (See Comments)    Unknown allergic reaction many years ago  . Quinolones Other (See Comments)     Unknown allergic reaction many years ago   Current Outpatient Medications on File Prior to Visit  Medication Sig Dispense Refill  . ACCU-CHEK FASTCLIX LANCETS MISC USE UP TO 4 TIMES DAILY AS  DIRECTED 408 each 1  . ACCU-CHEK GUIDE test strip USE UP TO 4 TIMES DAILY AS  DIRECTED 100 each 1  . ALPRAZolam (XANAX) 0.5 MG tablet Take 0.5-1 tablets (0.25-0.5 mg total) by mouth 2 (two) times daily as needed for anxiety. 90 tablet  1  . aspirin EC 81 MG tablet Take 81 mg by mouth daily.    . blood glucose meter kit and supplies KIT Dispense per patient and insurance preferred. Use up to four times daily as directed. E11.9 1 each 0  . cephALEXin (KEFLEX) 500 MG capsule Take 500 mg by mouth in the morning, at noon, and at bedtime.    . cetirizine (ZYRTEC) 10 MG tablet Take 10 mg by mouth daily.    . cyclobenzaprine (FLEXERIL) 10 MG tablet Take 10 mg by mouth 3 (three) times daily as needed for muscle spasms.    Marland Kitchen diltiazem (CARDIZEM CD) 240 MG 24 hr capsule TAKE 1 CAPSULE BY MOUTH IN  THE MORNING 90 capsule 3  . diltiazem (CARDIZEM) 30 MG tablet Take 2 tablets (60 mg total) by mouth 4 (four) times daily as needed (as needed for breakthrough heart racing/palpitations). 60 tablet 3  . flecainide (TAMBOCOR) 50 MG tablet Take 1 tablet (50 mg total) by mouth 2 (two) times daily. 180 tablet 3  . furosemide (LASIX) 20 MG tablet Take 20 mg by mouth as needed.    Marland Kitchen losartan (COZAAR) 50 MG tablet Take 1 tablet (50 mg total) by mouth 2 (two) times daily. 180 tablet 3  . nitroGLYCERIN (NITROSTAT) 0.4 MG SL tablet Place 1 tablet (0.4 mg total) under the tongue every 5 (five) minutes as needed for chest pain. 30 tablet 5  . omeprazole (PRILOSEC) 40 MG capsule Take 1 capsule (40 mg total) by mouth in the morning and at bedtime. 180 capsule 3  . zolpidem (AMBIEN) 5 MG tablet TAKE 1 TABLET BY MOUTH AT  BEDTIME AS NEEDED FOR SLEEP 90 tablet 1  . sulfamethoxazole-trimethoprim (BACTRIM DS) 800-160 MG tablet Take 1 tablet by  mouth 2 (two) times daily for 14 days. (Patient not taking: Reported on 09/02/2020) 28 tablet 0  . traZODone (DESYREL) 50 MG tablet Take 1 tablet (50 mg total) by mouth at bedtime as needed for sleep. (Patient not taking: Reported on 09/02/2020) 45 tablet 3   No current facility-administered medications on file prior to visit.        ROS:  All others reviewed and negative.  Objective        PE:  BP 124/78   Pulse 71   Temp 98.5 F (36.9 C) (Oral)   Ht $R'5\' 3"'NJ$  (1.6 m)   Wt 166 lb (75.3 kg)   SpO2 97%   BMI 29.41 kg/m                 Constitutional: Pt appears in NAD               HENT: Head: NCAT.                Right Ear: External ear normal.                 Left Ear: External ear normal.                Eyes: . Pupils are equal, round, and reactive to light. Conjunctivae and EOM are normal               Nose: without d/c or deformity               Neck: Neck supple. Gross normal ROM               Cardiovascular: Normal rate and regular rhythm.  Pulmonary/Chest: Effort normal and breath sounds without rales or wheezing.                Abd:  Soft, NT, ND, + BS, no organomegaly               Neurological: Pt is alert. At baseline orientation, motor grossly intact               Skin, LE edema - none; left lateral hip with prox surgical incision site with 2 cm area subq fluid collection mild tender but no surrounding signifiicant induration, redness, swelling or drainage               Psychiatric: Pt behavior is normal without agitation   Micro: none  Cardiac tracings I have personally interpreted today:  none  Pertinent Radiological findings (summarize): none   Lab Results  Component Value Date   WBC 9.5 07/21/2020   HGB 13.7 07/21/2020   HCT 41.3 07/21/2020   PLT 310.0 07/21/2020   GLUCOSE 94 07/21/2020   CHOL 175 07/21/2020   TRIG 114.0 07/21/2020   HDL 69.30 07/21/2020   LDLCALC 83 07/21/2020   ALT 12 07/21/2020   AST 11 07/21/2020   NA 138 07/21/2020    K 3.9 07/21/2020   CL 102 07/21/2020   CREATININE 0.81 07/21/2020   BUN 18 07/21/2020   CO2 27 07/21/2020   TSH 1.134 08/11/2020   INR 0.9 03/31/2020   HGBA1C 6.2 07/21/2020   MICROALBUR 2.9 (H) 07/21/2020   Assessment/Plan:  Renee [name] is a 75 y.o. White or Caucasian [1] female with  has a past medical history of Allergy, Anxiety, AV nodal re-entry tachycardia ([place]), Broken neck ([place]), Cancer ([place]), CHF (congestive heart failure) ([place]), Chronic chest pain, Chronic pain syndrome, Complication of anesthesia, Concussion, Degenerative joint disease, Diet-controlled type 2 diabetes mellitus ([place]), Diverticulosis of colon, Duodenitis without hemorrhage, Dysrhythmia, Fibromyalgia, GERD (gastroesophageal reflux disease), Hemorrhoids, Hypertension, Irritable bowel syndrome, Long QT interval, Myocardial infarction ([place]) (09/1991), Neuropathy, Osteoporosis, Paroxysmal A-fib (HCC), Recurrent upper respiratory infection (URI), SVT (supraventricular tachycardia) ([place]), and Thyroid nodule.  Left hip pain No evidence for cellulitis currently but has area of subq fluid collection I suspect cystic like, will likely need minor surgical interveniton for resolution - for ortho referral [facility],  to f/u any worsening symptoms or concerns  Hyperglycemia Lab Results  Component Value Date   HGBA1C 6.2 07/21/2020   Stable, pt to continue current medical treatment  - diet and wt control    Essential hypertension BP Readings from Last 3 Encounters:  09/02/20 124/78  08/20/20 122/70  08/11/20 124/78   Stable, pt to continue medical treatment cardizem, losartan, lasix   Followup: Return if symptoms worsen or fail to improve.  Cathlean Cower, MD 09/02/2020 8:15 PM [facility]

## 2020-09-02 NOTE — Assessment & Plan Note (Signed)
BP Readings from Last 3 Encounters:  09/02/20 124/78  08/20/20 122/70  08/11/20 124/78   Stable, pt to continue medical treatment cardizem, losartan, lasix

## 2020-09-07 ENCOUNTER — Telehealth: Payer: Self-pay | Admitting: Internal Medicine

## 2020-09-07 DIAGNOSIS — M25552 Pain in left hip: Secondary | ICD-10-CM

## 2020-09-07 NOTE — Telephone Encounter (Signed)
Marla w/ [facility] called and said that the patient is needing another referral to a major medical center per Dr. Jenny Reichmann since the patient has a hip infection. Please advise

## 2020-09-07 NOTE — Telephone Encounter (Signed)
Ok to contact pt - please verify that this is what the patient was told, and ask if she prefers [facility] or other such as [facility] or [facility]

## 2020-09-08 NOTE — Addendum Note (Signed)
Addended by: Biagio Borg on: 09/08/2020 09:15 PM   Modules accepted: Orders

## 2020-09-08 NOTE — Telephone Encounter (Signed)
Sorry I am unable to speak on phone, but I can refer to another ortho locally if she likes

## 2020-09-09 ENCOUNTER — Encounter: Payer: Self-pay | Admitting: Endocrinology

## 2020-09-09 ENCOUNTER — Encounter (HOSPITAL_COMMUNITY): Payer: Self-pay

## 2020-09-09 ENCOUNTER — Emergency Department (HOSPITAL_COMMUNITY)
Admission: EM | Admit: 2020-09-09 | Discharge: 2020-09-09 | Disposition: A | Discharge status: Home / Self Care | Payer: Medicare Other | Attending: Emergency Medicine | Admitting: Emergency Medicine

## 2020-09-09 DIAGNOSIS — R251 Tremor, unspecified: Secondary | ICD-10-CM

## 2020-09-09 DIAGNOSIS — I10 Essential (primary) hypertension: Secondary | ICD-10-CM | POA: Diagnosis not present

## 2020-09-09 DIAGNOSIS — Z96642 Presence of left artificial hip joint: Secondary | ICD-10-CM | POA: Insufficient documentation

## 2020-09-09 DIAGNOSIS — L02416 Cutaneous abscess of left lower limb: Secondary | ICD-10-CM

## 2020-09-09 DIAGNOSIS — Z8541 Personal history of malignant neoplasm of cervix uteri: Secondary | ICD-10-CM | POA: Diagnosis not present

## 2020-09-09 DIAGNOSIS — Z950 Presence of cardiac pacemaker: Secondary | ICD-10-CM | POA: Diagnosis not present

## 2020-09-09 DIAGNOSIS — Z7982 Long term (current) use of aspirin: Secondary | ICD-10-CM | POA: Diagnosis not present

## 2020-09-09 DIAGNOSIS — I110 Hypertensive heart disease with heart failure: Secondary | ICD-10-CM | POA: Diagnosis not present

## 2020-09-09 DIAGNOSIS — I509 Heart failure, unspecified: Secondary | ICD-10-CM | POA: Insufficient documentation

## 2020-09-09 DIAGNOSIS — Z79899 Other long term (current) drug therapy: Secondary | ICD-10-CM | POA: Diagnosis not present

## 2020-09-09 DIAGNOSIS — G252 Other specified forms of tremor: Secondary | ICD-10-CM | POA: Diagnosis not present

## 2020-09-09 LAB — MAGNESIUM: Magnesium: 2 mg/dL (ref 1.7–2.4)

## 2020-09-09 LAB — CBC WITH DIFFERENTIAL/PLATELET
Abs Immature Granulocytes: 0.01 10*3/uL (ref 0.00–0.07)
Basophils Absolute: 0.1 10*3/uL (ref 0.0–0.1)
Basophils Relative: 1 %
Eosinophils Absolute: 0.2 10*3/uL (ref 0.0–0.5)
Eosinophils Relative: 3 %
HCT: 41.4 % (ref 36.0–46.0)
Hemoglobin: 13.3 g/dL (ref 12.0–15.0)
Immature Granulocytes: 0 %
Lymphocytes Relative: 35 %
Lymphs Abs: 2.1 10*3/uL (ref 0.7–4.0)
MCH: 26.7 pg (ref 26.0–34.0)
MCHC: 32.1 g/dL (ref 30.0–36.0)
MCV: 83 fL (ref 80.0–100.0)
Monocytes Absolute: 0.3 10*3/uL (ref 0.1–1.0)
Monocytes Relative: 5 %
Neutro Abs: 3.3 10*3/uL (ref 1.7–7.7)
Neutrophils Relative %: 56 %
Platelets: 288 10*3/uL (ref 150–400)
RBC: 4.99 MIL/uL (ref 3.87–5.11)
RDW: 13.1 % (ref 11.5–15.5)
WBC: 5.9 10*3/uL (ref 4.0–10.5)
nRBC: 0 % (ref 0.0–0.2)

## 2020-09-09 LAB — BASIC METABOLIC PANEL
Anion gap: 11 (ref 5–15)
BUN: 7 mg/dL — ABNORMAL LOW (ref 8–23)
CO2: 23 mmol/L (ref 22–32)
Calcium: 10 mg/dL (ref 8.9–10.3)
Chloride: 106 mmol/L (ref 98–111)
Creatinine, Ser: 0.69 mg/dL (ref 0.44–1.00)
GFR, Estimated: >60 mL/min (ref >60)
Glucose, Bld: 103 mg/dL — ABNORMAL HIGH (ref 70–99)
Potassium: 3.9 mmol/L (ref 3.5–5.1)
Sodium: 140 mmol/L (ref 135–145)

## 2020-09-09 LAB — TROPONIN I (HIGH SENSITIVITY): Troponin I (High Sensitivity): 4 ng/L (ref <18)

## 2020-09-09 MED ORDER — LIDOCAINE HCL (PF) 1 % IJ SOLN
INTRAMUSCULAR | Status: AC
  Administered 2020-09-09: 5 mL
  Filled 2020-09-09: qty 5

## 2020-09-09 NOTE — ED Provider Notes (Signed)
[facility] EMERGENCY DEPARTMENT Provider Note   CSN: [id number] Arrival date & time: 09/09/20  5885     History Chief Complaint  Patient presents with  . Jaw Pain    Renee [name] is a 75 y.o. female.  Patient presents after episode of tremor primarily in the right jaw.  Patient's had tremors different areas of the body in the past and has been seen outpatient for this.  Patient denies new medications or stroke symptoms or signs.  Tremor lasted a few minutes and resolved.  Patient has had intermittent globus sensation and reflux symptoms with hoarse voice for over a year.  Patient is seen ENT and gastroenterology and had pictures/camera without acute findings.  Patient saw neurology back in 2015.  Patient denies fevers.  Patient also has had fluid collection left lateral hip at location of scar from previous surgery.  Patient was placed on Keflex with mild improvement however it has returned.        Past Medical History:  Diagnosis Date  . Allergy    tape, electrodes  . Anxiety   . AV nodal re-entry tachycardia [facility])    a. s/p RFA 1993- Dr. Rolland Porter, [place]  . Broken neck ([place])    1993  . Cancer (HCC)    hx of cervical   ca  . CHF (congestive heart failure) ([place])   . Chronic chest pain    a. 2005 Cath: Mild nonobstructive plaque (30-40% LCX);  b. 08/2011 low risk myoview;  c. 09/2011 Coronary CT: NL cors w ca score of 0.  . Chronic pain syndrome   . Complication of anesthesia    blood pressure and heart dropped in 2009    . Concussion    1979, 1993  . Degenerative joint disease    a. s/p L Total Knee Arthroplasty.  . Diet-controlled type 2 diabetes mellitus ([place])    pt. denies  . Diverticulosis of colon   . Duodenitis without hemorrhage   . Dysrhythmia    REPORTS , SINCE PLACEMENT OF PACEMAKER, STILL CAN FEEL HEART " GOING IN AND OUT OF [facility]" REPORTS, YESTERDAY HER BP SYSTOLIC WAS IN THE 027X AND WAS BREATHLESS, HR 112, SWEATING ; DENIES SYNCOPE  ,  DENIES CHEST PAIN   . Fibromyalgia   . GERD (gastroesophageal reflux disease)   . Hemorrhoids   . Hypertension   . Irritable bowel syndrome   . Long QT interval    a. mild - advised to avoid meds that may prolong QT.  Marland Kitchen Myocardial infarction [facility]) 09/1991   does not know when second heart attack was  . Neuropathy   . Osteoporosis   . Paroxysmal A-fib (HCC)    PER PATIENT. I CAN FEEL MY HEART RACING WHEN IM IN IT   . Recurrent upper respiratory infection (URI)   . SVT (supraventricular tachycardia) (HCC)    a. nonsustained SVT - previously offered flecainide but refused;  b. 12/2012 30 day event monitor w/o significant arrhythmias.  . Thyroid nodule     Patient Active Problem List   Diagnosis Date Noted  . Cervical intraepithelial neoplasia grade 1 09/02/2020  . Menopausal syndrome 09/02/2020  . Overactive bladder 09/02/2020  . Postmenopausal bleeding 09/02/2020  . Left hip pain 09/02/2020  . Cellulitis 08/21/2020  . Recurrent UTI 07/21/2020  . Multiple thyroid nodules 01/14/2020  . Epigastric pain 12/10/2019  . Atypical chest pain 12/10/2019  . PVC's (premature ventricular contractions) 08/06/2019  . Hoarseness 07/16/2019  . Sinus  node dysfunction ([place]) 03/11/2019  . Chronic cough 03/05/2019  . Urinary retention 03/05/2019  . Generalized weakness 09/11/2018  . Dysuria 05/09/2018  . Acute sinus infection 04/20/2018  . Other allergic rhinitis 04/20/2018  . Cystocele, midline 08/01/2017  . Hyperglycemia 01/31/2017  . Bilateral foot pain 01/31/2017  . Laryngopharyngeal reflux (LPR) 08/18/2016  . Globus pharyngeus 08/01/2016  . Thyroid nodule 08/01/2016  . Cough 06/24/2016  . Syncope 03/03/2016  . Pacemaker 03/03/2016  . Chest pain at rest 09/13/2015  . Spinal stenosis of lumbar region 09/07/2015  . Raynaud phenomenon 09/07/2015  . Insomnia 09/07/2015  . Preventative health care 12/18/2014  . Paroxysmal spells 01/28/2014  . Dysthymia 01/28/2014  . Injury of leg  10/07/2013  . Headache(784.0) 03/14/2013  . Eye problem 02/15/2013  . Persistent headaches 02/15/2013  . Low blood sugar 05/31/2012  . Pre-syncope 10/04/2011  . NSVT (nonsustained ventricular tachycardia) ([place]) 10/04/2011  . Dyspnea 10/01/2009  . Diverticulosis of large intestine 09/24/2009  . SWEATING 09/24/2009  . FATIQUE AND MALAISE 09/16/2009  . LONG QT SYNDROME 03/19/2009  . AV NODAL REENTRY TACHYCARDIA 10/03/2008  . Other dysphagia 06/17/2008  . HEMORRHOIDS 06/16/2008  . DUODENITIS, WITHOUT HEMORRHAGE 06/16/2008  . CONSTIPATION, CHRONIC 06/16/2008  . THYROID NODULE, right lobe. 05/26/2008  . Chronic pain syndrome 05/26/2008  . Coronary atherosclerosis 05/26/2008  . BRONCHITIS, RECURRENT 05/26/2008  . Irritable bowel syndrome 05/26/2008  . FECAL INCONTINENCE 05/26/2008  . Anxiety state 03/24/2008  . Essential hypertension 03/24/2008  . GERD 03/24/2008  . DEGENERATIVE JOINT DISEASE 03/24/2008  . BACK PAIN, LUMBAR 03/24/2008  . Fibromyalgia 03/24/2008  . Chest pain 03/24/2008  . NEUROPATHY, HX OF 03/24/2008    Past Surgical History:  Procedure Laterality Date  . ABLATION OF DYSRHYTHMIC FOCUS  1993  . ANTERIOR AND POSTERIOR REPAIR N/A 08/01/2017   Procedure: ANTERIOR (CYSTOCELE) AND POSTERIOR REPAIR (RECTOCELE);  Surgeon: Bjorn Loser, MD;  Location: [facility];  Service: Urology;  Laterality: N/A;  . APPENDECTOMY     1980  . BILATERAL SALPINGECTOMY Left 08/01/2017   Procedure: SALPINGECTOMY;  Surgeon: Bobbye Charleston, MD;  Location: [facility];  Service: Gynecology;  Laterality: Left;  . BIOPSY THYROID    . CARDIAC ELECTROPHYSIOLOGY STUDY AND ABLATION     atrioventricular nodal reentant tachycardia  . CHOLECYSTECTOMY  1980  . COLONOSCOPY  06/25/2008   Diverticulosis and Hemorrhoids  . COLPOSCOPY    . CORONARY ANGIOPLASTY  2007  . CYSTOSCOPY N/A 08/01/2017   Procedure: CYSTOSCOPY;  Surgeon: Bjorn Loser, MD;  Location: [facility];  Service: Urology;  Laterality: N/A;   . EP IMPLANTABLE DEVICE N/A 03/03/2016   Procedure: Pacemaker Implant;  Surgeon: Evans Lance, MD;  Location: [place] CV LAB;  Service: Cardiovascular;  Laterality: N/A;  . ESOPHAGOGASTRODUODENOSCOPY    . HYSTEROSCOPY  2002   with resection of endometrial polyps. by Dr.MCPhail  . HYSTEROSCOPY WITH D & C N/A 02/05/2015   Procedure: DILATATION AND CURETTAGE /HYSTEROSCOPY;  Surgeon: Vanessa Kick, MD;  Location: [place] [facility];  Service: Gynecology;  Laterality: N/A;  . LOOP RECORDER IMPLANT  08/21/2013   MDT LinQ implanted by Dr Lovena Le for syncope  . LOOP RECORDER IMPLANT N/A 08/21/2013   Procedure: LOOP RECORDER IMPLANT;  Surgeon: Evans Lance, MD;  Location: [facility] CATH LAB;  Service: Cardiovascular;  Laterality: N/A;  . ORIF RADIAL HEAD / NECK FRACTURE  09/05/88  . TILT TABLE STUDY N/A 05/15/2013   Procedure: TILT TABLE STUDY;  Surgeon: Deboraha Sprang, MD;  Location: [facility] CATH  LAB;  Service: Cardiovascular;  Laterality: N/A;  . TOTAL HIP ARTHROPLASTY  10/10   Left, at [place]    . VAGINAL HYSTERECTOMY N/A 08/01/2017   Procedure: HYSTERECTOMY VAGINAL;  Surgeon: Bobbye Charleston, MD;  Location: [facility];  Service: Gynecology;  Laterality: N/A;     OB History    Gravida  2   Para  2   Term      Preterm      AB      Living  2     SAB      IAB      Ectopic      Multiple      Live Births              Family History  Problem Relation Age of Onset  . Ovarian cancer Mother   . Stroke Mother   . Diabetes Mother   . Asthma Mother   . Lung cancer Father        Heart problems  . Heart disease Sister   . Diabetes Brother   . Diabetes Sister        Pacemaker, CHF  . Heart disease Sister   . Asthma Sister   . Colon cancer Neg Hx   . Esophageal cancer Neg Hx   . Stomach cancer Neg Hx   . Rectal cancer Neg Hx     Social History   Tobacco Use  . Smoking status: Never Smoker  . Smokeless tobacco: Never Used  Vaping Use  . Vaping  Use: Never used  Substance Use Topics  . Alcohol use: Yes    Comment: Occasional   . Drug use: No    Home Medications Prior to Admission medications   Medication Sig Start Date End Date Taking? Authorizing Provider  ACCU-CHEK FASTCLIX LANCETS MISC USE UP TO 4 TIMES DAILY AS  DIRECTED 04/11/18   Biagio Borg, MD  ACCU-CHEK GUIDE test strip USE UP TO 4 TIMES DAILY AS  DIRECTED 04/11/18   Biagio Borg, MD  ALPRAZolam Duanne Moron) 0.5 MG tablet Take 0.5-1 tablets (0.25-0.5 mg total) by mouth 2 (two) times daily as needed for anxiety. 10/02/19   Biagio Borg, MD  aspirin EC 81 MG tablet Take 81 mg by mouth daily.    [provider]  blood glucose meter kit and supplies KIT Dispense per patient and insurance preferred. Use up to four times daily as directed. E11.9 02/21/18   Biagio Borg, MD  cephALEXin (KEFLEX) 500 MG capsule Take 500 mg by mouth in the morning, at noon, and at bedtime.    [provider]  cetirizine (ZYRTEC) 10 MG tablet Take 10 mg by mouth daily.    [provider]  cyclobenzaprine (FLEXERIL) 10 MG tablet Take 10 mg by mouth 3 (three) times daily as needed for muscle spasms.    [provider]  diltiazem (CARDIZEM CD) 240 MG 24 hr capsule TAKE 1 CAPSULE BY MOUTH IN  THE MORNING 02/10/20   Larey Dresser, MD  diltiazem (CARDIZEM) 30 MG tablet Take 2 tablets (60 mg total) by mouth 4 (four) times daily as needed (as needed for breakthrough heart racing/palpitations). 09/11/19   Evans Lance, MD  flecainide (TAMBOCOR) 50 MG tablet Take 1 tablet (50 mg total) by mouth 2 (two) times daily. 04/16/19   Evans Lance, MD  furosemide (LASIX) 20 MG tablet Take 20 mg by mouth as needed.  [provider]  losartan (COZAAR) 50 MG tablet Take 1 tablet (50 mg total) by mouth 2 (two) times daily. 07/02/20   Larey Dresser, MD  nitroGLYCERIN (NITROSTAT) 0.4 MG SL tablet Place 1 tablet (0.4 mg total) under the tongue every 5 (five) minutes as needed  for chest pain. 09/01/20   Larey Dresser, MD  omeprazole (PRILOSEC) 40 MG capsule Take 1 capsule (40 mg total) by mouth in the morning and at bedtime. 12/24/19   Ladene Artist, MD  traZODone (DESYREL) 50 MG tablet Take 1 tablet (50 mg total) by mouth at bedtime as needed for sleep. Patient not taking: Reported on 09/02/2020 08/11/20   Larey Dresser, MD  zolpidem (AMBIEN) 5 MG tablet TAKE 1 TABLET BY MOUTH AT  BEDTIME AS NEEDED FOR SLEEP 08/01/19   Biagio Borg, MD    Allergies    Cyclobenzaprine, Azithromycin, Nitrofurantoin, Adhesive [tape], Moxifloxacin, and Quinolones  Review of Systems   Review of Systems  Constitutional: Negative for chills and fever.  HENT: Negative for congestion.   Eyes: Negative for visual disturbance.  Respiratory: Negative for shortness of breath.   Cardiovascular: Negative for chest pain.  Gastrointestinal: Negative for abdominal pain and vomiting.  Genitourinary: Negative for dysuria and flank pain.  Musculoskeletal: Negative for back pain, neck pain and neck stiffness.  Skin: Positive for rash.  Neurological: Positive for tremors. Negative for light-headedness and headaches.  Psychiatric/Behavioral: The patient is nervous/anxious.     Physical Exam Updated Vital Signs BP 136/70   Pulse 67   Temp 98.4 F (36.9 C) (Oral)   Resp 20   Ht _0  (1.6 m)   Wt 75.8 kg   SpO2 98%   BMI 29.58 kg/m   Physical Exam Vitals and nursing note reviewed.  Constitutional:      Appearance: She is well-developed.  HENT:     Head: Normocephalic and atraumatic.  Eyes:     General:        Right eye: No discharge.        Left eye: No discharge.     Conjunctiva/sclera: Conjunctivae normal.  Neck:     Trachea: No tracheal deviation.  Cardiovascular:     Rate and Rhythm: Normal rate and regular rhythm.  Pulmonary:     Effort: Pulmonary effort is normal.     Breath sounds: Normal breath sounds.  Abdominal:     General: There is no distension.      Palpations: Abdomen is soft.     Tenderness: There is no abdominal tenderness. There is no guarding.  Musculoskeletal:        General: Swelling present. Normal range of motion.     Cervical back: Normal range of motion and neck supple.  Skin:    General: Skin is warm.     Findings: No rash.     Comments: Patient has 2 cm area of fluctuance with minimal erythema over scar left lateral hip.  No induration.  No spreading erythema or warmth down the thigh.  Minimal tenderness.  Neurological:     Mental Status: She is alert and oriented to person, place, and time.  Psychiatric:        Mood and Affect: Mood normal.     ED Results / Procedures / Treatments   Labs (all labs ordered are listed, but only abnormal results are displayed) Labs Reviewed  BASIC METABOLIC PANEL - Abnormal; Notable for the following components:      Result Value  Glucose, Bld 103 (*)    BUN 7 (*)    All other components within normal limits  AEROBIC CULTURE W GRAM STAIN (SUPERFICIAL SPECIMEN)  CBC WITH DIFFERENTIAL/PLATELET  MAGNESIUM  TROPONIN I (HIGH SENSITIVITY)    EKG EKG Interpretation  Date/Time:  Wednesday September 09 2020 09:26:38 EDT Ventricular Rate:  81 PR Interval:  175 QRS Duration: 110 QT Interval:  406 QTC Calculation: 472 R Axis:   3 Text Interpretation: Sinus rhythm Atrial premature complex Low voltage, precordial leads Abnormal inferior Q waves Borderline T abnormalities, anterior leads Confirmed by Elnora Morrison [phone]) on 09/09/2020 10:00:10 AM   Radiology No results found.  Procedures .Marland KitchenIncision and Drainage  Date/Time: 09/09/2020 12:56 PM Performed by: Elnora Morrison, MD Authorized by: Elnora Morrison, MD   Consent:    Consent obtained:  Verbal   Consent given by:  Patient   Risks, benefits, and alternatives were discussed: yes     Risks discussed:  Bleeding, incomplete drainage, pain, infection and damage to other organs   Alternatives discussed:  No treatment Universal  protocol:    Procedure explained and questions answered to patient or proxy's satisfaction: yes     Patient identity confirmed:  Verbally with patient Location:    Type:  Abscess   Location:  Lower extremity   Lower extremity location:  Hip   Hip location:  L hip Pre-procedure details:    Skin preparation:  Chlorhexidine Sedation:    Sedation type:  None Anesthesia:    Anesthesia method:  Local infiltration   Local anesthetic:  Lidocaine 1% WITH epi Procedure type:    Complexity:  Simple Procedure details:    Ultrasound guidance: no     Needle aspiration: yes     Needle size:  18 G   Incision depth:  Dermal   Drainage:  Serosanguinous   Drainage amount:  Moderate   Wound treatment:  Wound left open   Packing materials:  None Post-procedure details:    Procedure completion:  Tolerated     Medications Ordered in ED Medications  lidocaine (PF) (XYLOCAINE) 1 % injection (has no administration in time range)    ED Course  I have reviewed the triage vital signs and the nursing notes.  Pertinent labs & imaging results that were available during my care of the patient were reviewed by me and considered in my medical decision making (see chart for details).    MDM Rules/Calculators/A&P                          Patient with history of multiple medical conditions presents with recurrent hoarse voice and loss of voice and intermittent tremors for months.  Patient is seen multiple clinicians, reviewed medical records patient has seen gastroenterology, ENT and primary doctor.  Discussed will check electrolytes and basic blood work.  Patient has outpatient follow-up.  Recommended follow-up with neurology for intermittent tremors.  Vital signs normalized in the ED without treatment.  Patient also has small area of fluctuance and patient agreed to aspiration, patient has had this intermittently for weeks and followed up with primary doctor.  Patient well-appearing on reassessment.   Blood work reviewed showing normal electrolytes, normal hemoglobin, normal white blood cell count.  Left thigh wound drains with 5 cc syringe.  Discussed supportive care and reasons to return.   Final Clinical Impression(s) / ED Diagnoses Final diagnoses:  Occasional tremors  Abscess of left thigh    Rx / DC Orders ED  Discharge Orders    None       Elnora Morrison, MD 09/09/20 1258

## 2020-09-09 NOTE — Discharge Instructions (Addendum)
Call [place] neurology for appointment [phone] If unable to get in then call [facility] neurology for follow up of tremors. Soak wound for 3 days.  See a clinician for fevers, spreading redness or new concerns. Return for new Concerns, avoid excessive caffeine. Follow up wound culture results.

## 2020-09-09 NOTE — ED Triage Notes (Signed)
Reports Tremors right jaw, woke her up this morning, started in February. Also lost voice this morning along with tremor. Pain with swallowing that lasts after tremor subsides. Sharp pain.

## 2020-09-09 NOTE — ED Notes (Signed)
ED Provider at bedside. 
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
## 2020-09-11 LAB — AEROBIC CULTURE W GRAM STAIN (SUPERFICIAL SPECIMEN): Culture: NO GROWTH

## 2020-09-14 ENCOUNTER — Other Ambulatory Visit: Payer: Self-pay

## 2020-09-14 ENCOUNTER — Ambulatory Visit: Payer: Medicare Other | Admitting: Neurology

## 2020-09-14 ENCOUNTER — Encounter: Payer: Self-pay | Admitting: Neurology

## 2020-09-14 VITALS — BP 134/78 | HR 74 | Ht 63.25 in | Wt 167.0 lb

## 2020-09-14 DIAGNOSIS — R251 Tremor, unspecified: Secondary | ICD-10-CM

## 2020-09-14 DIAGNOSIS — M542 Cervicalgia: Secondary | ICD-10-CM | POA: Diagnosis not present

## 2020-09-14 NOTE — Patient Instructions (Signed)
After  Visit Summary:   Medication Changes: NONE MADE TODAY Please contact your pharmacy if you need medication refills on your medications prescribed by Dr Tat.   Lab work: NONE ORDERED   Testing: Dr Tat has ordered a cervical spine MRI on your neck. Once we get this appointment scheduled we will contact you to give you the details.   Referral:  NONE  Follow up:  Your physician recommends that you schedule a follow-up appointment NOT NEEDED

## 2020-09-16 ENCOUNTER — Ambulatory Visit: Payer: Medicare Other | Admitting: Internal Medicine

## 2020-09-16 ENCOUNTER — Encounter: Payer: Self-pay | Admitting: Internal Medicine

## 2020-09-16 ENCOUNTER — Other Ambulatory Visit: Payer: Self-pay

## 2020-09-16 VITALS — BP 130/80 | HR 80 | Ht 63.25 in | Wt 169.0 lb

## 2020-09-16 DIAGNOSIS — Z950 Presence of cardiac pacemaker: Secondary | ICD-10-CM | POA: Diagnosis not present

## 2020-09-16 DIAGNOSIS — I495 Sick sinus syndrome: Secondary | ICD-10-CM

## 2020-09-16 DIAGNOSIS — I10 Essential (primary) hypertension: Secondary | ICD-10-CM | POA: Diagnosis not present

## 2020-09-16 DIAGNOSIS — I493 Ventricular premature depolarization: Secondary | ICD-10-CM

## 2020-09-16 NOTE — Patient Instructions (Signed)
Medication Instructions:  Your physician recommends that you continue on your current medications as directed. Please refer to the Current Medication list given to you today.  Labwork: None ordered.  Testing/Procedures: None ordered.  Follow-Up: Your physician wants you to follow-up in: one year with Cristopher Peru, MD or one of the following Advanced Practice Providers on your designated Care Team:    Chanetta Marshall, NP  Tommye Standard, PA-C  Legrand Como "Jonni Sanger" [place], [place]  Remote monitoring is used to monitor your Pacemaker from home. This monitoring reduces the number of office visits required to check your device to one time per year. It allows [place] to keep an eye on the functioning of your device to ensure it is working properly. You are scheduled for a device check from home on 11/10/2020. You may send your transmission at any time that day. If you have a wireless device, the transmission will be sent automatically. After your physician reviews your transmission, you will receive a postcard with your next transmission date.  Any Other Special Instructions Will Be Listed Below (If Applicable).  If you need a refill on your cardiac medications before your next appointment, please call your pharmacy.
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
## 2020-09-17 ENCOUNTER — Telehealth: Payer: Self-pay | Admitting: Internal Medicine

## 2020-09-17 NOTE — Telephone Encounter (Signed)
Sent mychart message to Pt advising NO CHANGES were made to her pacemaker programming yesterday.  Advised to call primary cardiologist at [facility] if she has further chest pain.

## 2020-09-17 NOTE — Telephone Encounter (Signed)
    Pt c/o of Chest Pain: STAT if CP now or developed within 24 hours  1. Are you having CP right now? No  2. Are you experiencing any other symptoms (ex. SOB, nausea, vomiting, sweating)? No  3. How long have you been experiencing CP? Today at 1:20  4. Is your CP continuous or coming and going? continuous   5. Have you taken Nitroglycerin? Yes   Pt saw Dr. Lovena Le yesterday and she said today at 1:20 she had angina attack she took her first nitroglycerin didn't work and she took another one at 1:25 pm and that ease off the chest pain   ?

## 2020-09-17 NOTE — Telephone Encounter (Signed)
Pt reports that she saw Dr Lovena Le yesterday and she thinks they made a change to her device settings and it is affecting her. Informed pt that per Dr Tanna Furry note, they did not make any changes to her device yesterday.  She seemed surprised by this and further states that she felt something last night and thought it related to a setting change. I then advised pt that she should call her general cardiologist, Aundra Dubin, about having to take her nitroglycerin as he prescribes/follows that part of her heart.  Further stated that Dr. Lovena Le follows her PPM & PVCs and Aundra Dubin would follow the rest.  Pt stated that was not true and that both doctors advise on this.  She then asked my name, I gave it and explained I was one of the EP nurses, she told me she had said enough and hung up on me.  Notified Dr. Tanna Furry RN of issue and forwarding note to her for follow up w/ pt.

## 2020-09-29 ENCOUNTER — Other Ambulatory Visit: Payer: Self-pay

## 2020-09-29 ENCOUNTER — Ambulatory Visit (HOSPITAL_COMMUNITY)
Admission: RE | Admit: 2020-09-29 | Discharge: 2020-09-29 | Disposition: A | Discharge status: Home / Self Care | Payer: Medicare Other | Source: Ambulatory Visit | Attending: Neurology | Admitting: Neurology

## 2020-09-29 DIAGNOSIS — M542 Cervicalgia: Secondary | ICD-10-CM | POA: Insufficient documentation

## 2020-09-29 NOTE — Progress Notes (Signed)
[employer] device representative came and adjusted pacemaker to safe mode and printed reports. Patient in auto settings per rep so no need to adjust settings afterward scan.   I stayed and monitored patient during MRI. No issues during or after scan. Nothing further needed at this time.

## 2020-10-05 ENCOUNTER — Telehealth: Payer: Self-pay

## 2020-10-05 NOTE — Telephone Encounter (Signed)
There was no indication for MRI brain and I cannot justify that.  In addition, she spent the large majority of her visit c/o neck pain.  As per note, she does have some pinched nerves there.  I have nothing further to offer as she sees neurosx.

## 2020-10-05 NOTE — Telephone Encounter (Signed)
Spoke with patient and gave her Dr Doristine Devoid recommendations.   She states if Dr Tat would have reviewed her last Brain CT on 03/31/2018 she would see that Brain MRI is necessary. Patient states she is having pain in her head and neck. She states she has right side jaw tremors in her head at night so Dr Tat would not see them during the day. She states that she is not trying to be disrespectful but the only examination Dr Tat did was have her hold out her arms and bang her knee and that's not going to tell her anything about her head tremoring. She states her head was shaking like something in a shaker box. She states when she went to cardiology they told her to see a neurologist and Dr Tat told her that she is a tremor doctor but not treating her tremor. She states she will follow up with neurosurgery. But she doesn't understand why Dr Tat is not treating her tremors.   I advised patient that I would speak with Dr Tat and get back to her. She voiced understanding.

## 2020-10-05 NOTE — Telephone Encounter (Signed)
I have done all I can to help her.  If I cannot see tremor, I cannot treat it.  A brain MRI is not indicated for head tremor.  A very complete neuro exam was done on this patient and is well documented.

## 2020-10-05 NOTE — Telephone Encounter (Signed)
Spoke with patient to discuss cervical MRI results.   I was not able to go over results with patient because she states she would like to have a office visit to review, discuss and look at her images from her MRI.   She wants to know why there was not a MRI done on her head. She states she was being seen for head tremor. She states the doctor hit her knees and checked her reflexes but she is not sure why a MRI on her head was not done. She states she cant believe a MRI on her head was not done and is not sure why they were not done at the same time. She states she has a pacemaker and does not need to have multiple MRI's. She states she has pain in her neck that radiates to the top of her head and she has told you that.   Patient requested that I speak with Dr Tat and get back to her.

## 2020-10-05 NOTE — Telephone Encounter (Signed)
Left message for patient to contact office.

## 2020-10-09 ENCOUNTER — Telehealth: Payer: Self-pay

## 2020-10-09 NOTE — Telephone Encounter (Signed)
Biotronik alert received for AT episode recorded.  HR just above max track rate of 140bpm.  Average rate of 148bpm.  Episode lasted about 6 1/2 minutes.   Pt meds include: Flecainide 50 mg BID, Diltiazem 240mg  daily with PRN dose of 60mg  Q4 times daily PRN for palpitations/ racing heart.    Spoke with pt, she reports at time of episode she was mowing the lawn with a push mower.  She felt weak and couldn't go on anymore, she had to go inside and lay down.  She did not take her PRN diltiazem.  Patient stated she has been having frequent spells similar to what she was having when a 30 day heart monitor was placed.  These episodes where she is suddenly feeling weak started after her most recent appt with Dr. Lovena Le on 4/6.  Assured patient that no changes were made in the programming of ehr device.    Advised pt I would forward to Dr. Lovena Le for review/ recommendations.

## 2020-10-13 ENCOUNTER — Telehealth: Payer: Self-pay | Admitting: Neurology

## 2020-10-13 NOTE — Telephone Encounter (Signed)
Called patient and left message for a call back.  

## 2020-10-13 NOTE — Telephone Encounter (Signed)
The neurosurgeon will be able to show her those pictures in person.  That is their specialty and I believe we were waiting for her to let us know the neurosurgeon she previously saw.

## 2020-10-13 NOTE — Telephone Encounter (Signed)
New message    Patient checking on the status of neurosurgeon referral & MRI.  Patient voiced she spoken to a CMA expressed she does not want results over the phone wants to be seen in person with the MD to see the pictures.

## 2020-10-13 NOTE — Telephone Encounter (Signed)
F/u   Returning call back to nurse from today 

## 2020-10-13 NOTE — Telephone Encounter (Signed)
Called patient and left a message for a call back.  

## 2020-10-14 NOTE — Telephone Encounter (Signed)
Patient called in after hours returning our call about results

## 2020-10-14 NOTE — Telephone Encounter (Signed)
I would not recommend that degree of physical activity. She should take a prn diltiazem if her HR does not come down after 10 minutes.

## 2020-10-15 NOTE — Telephone Encounter (Signed)
Refer her to [place] [facility], Dr. Christella Noa is fine

## 2020-10-15 NOTE — Telephone Encounter (Signed)
Called and spoke to patient in regards to her questions and concerns. Patient stated that she had a MRI done and had a pinched nerve. Patient informed me she has had a broken neck. I asked patient what she needs and that I was calling to see how I could help her. Patient needs a referral to Neurosurgery.Patient would like a referral sent to a [employer], advised patient that [facility] does not have a Chief of Staff.   Informed patient that Dr. Carles Collet was waiting to hear back from her in regards to who she previously saw for Neurosurgery. Patient stated that she has not seen a Chief of Staff.   During call patient also stated we need to let patients know why employees have left and also wanted to know what tee left. Informed patient that we aren't required to let patients know why employees leave as it is not their business.  Patient would like a call back ASAP. I Informed patient that I would call her back at my earliest conveinence because we have a busy day with patients in office.

## 2020-10-16 ENCOUNTER — Other Ambulatory Visit: Payer: Self-pay

## 2020-10-16 DIAGNOSIS — M542 Cervicalgia: Secondary | ICD-10-CM

## 2020-10-16 DIAGNOSIS — N870 Mild cervical dysplasia: Secondary | ICD-10-CM

## 2020-10-16 NOTE — Telephone Encounter (Signed)
Referral has been created and sent to [facility]. Patient has been called 10/16/2020 at 9:34am and informed that referral has been sent. Provided patient with Name, address, and phone number of [place] [facility]. Patient had no further questions or concerns.

## 2020-10-19 DIAGNOSIS — M4722 Other spondylosis with radiculopathy, cervical region: Secondary | ICD-10-CM | POA: Diagnosis not present

## 2020-10-26 ENCOUNTER — Emergency Department (HOSPITAL_COMMUNITY): Payer: Medicare Other

## 2020-10-26 ENCOUNTER — Other Ambulatory Visit: Payer: Self-pay

## 2020-10-26 ENCOUNTER — Emergency Department (HOSPITAL_COMMUNITY)
Admission: EM | Admit: 2020-10-26 | Discharge: 2020-10-26 | Disposition: A | Discharge status: Home / Self Care | Payer: Medicare Other | Attending: Emergency Medicine | Admitting: Emergency Medicine

## 2020-10-26 ENCOUNTER — Encounter (HOSPITAL_COMMUNITY): Payer: Self-pay

## 2020-10-26 DIAGNOSIS — R531 Weakness: Secondary | ICD-10-CM | POA: Diagnosis not present

## 2020-10-26 DIAGNOSIS — Z8541 Personal history of malignant neoplasm of cervix uteri: Secondary | ICD-10-CM | POA: Diagnosis not present

## 2020-10-26 DIAGNOSIS — I509 Heart failure, unspecified: Secondary | ICD-10-CM | POA: Diagnosis not present

## 2020-10-26 DIAGNOSIS — E1140 Type 2 diabetes mellitus with diabetic neuropathy, unspecified: Secondary | ICD-10-CM | POA: Insufficient documentation

## 2020-10-26 DIAGNOSIS — Z96642 Presence of left artificial hip joint: Secondary | ICD-10-CM | POA: Insufficient documentation

## 2020-10-26 DIAGNOSIS — Z7982 Long term (current) use of aspirin: Secondary | ICD-10-CM | POA: Insufficient documentation

## 2020-10-26 DIAGNOSIS — M542 Cervicalgia: Secondary | ICD-10-CM | POA: Insufficient documentation

## 2020-10-26 DIAGNOSIS — G8929 Other chronic pain: Secondary | ICD-10-CM | POA: Insufficient documentation

## 2020-10-26 DIAGNOSIS — Z79899 Other long term (current) drug therapy: Secondary | ICD-10-CM | POA: Insufficient documentation

## 2020-10-26 DIAGNOSIS — R202 Paresthesia of skin: Secondary | ICD-10-CM | POA: Diagnosis not present

## 2020-10-26 DIAGNOSIS — Z951 Presence of aortocoronary bypass graft: Secondary | ICD-10-CM | POA: Insufficient documentation

## 2020-10-26 DIAGNOSIS — R110 Nausea: Secondary | ICD-10-CM | POA: Diagnosis not present

## 2020-10-26 DIAGNOSIS — R519 Headache, unspecified: Secondary | ICD-10-CM | POA: Diagnosis not present

## 2020-10-26 DIAGNOSIS — R42 Dizziness and giddiness: Secondary | ICD-10-CM | POA: Insufficient documentation

## 2020-10-26 DIAGNOSIS — R251 Tremor, unspecified: Secondary | ICD-10-CM | POA: Diagnosis not present

## 2020-10-26 DIAGNOSIS — I110 Hypertensive heart disease with heart failure: Secondary | ICD-10-CM | POA: Diagnosis not present

## 2020-10-26 LAB — CBC
HCT: 39.7 % (ref 36.0–46.0)
Hemoglobin: 12.9 g/dL (ref 12.0–15.0)
MCH: 26.7 pg (ref 26.0–34.0)
MCHC: 32.5 g/dL (ref 30.0–36.0)
MCV: 82.2 fL (ref 80.0–100.0)
Platelets: 241 10*3/uL (ref 150–400)
RBC: 4.83 MIL/uL (ref 3.87–5.11)
RDW: 12.9 % (ref 11.5–15.5)
WBC: 4.8 10*3/uL (ref 4.0–10.5)
nRBC: 0 % (ref 0.0–0.2)

## 2020-10-26 LAB — URINALYSIS, ROUTINE W REFLEX MICROSCOPIC
Bilirubin Urine: NEGATIVE
Glucose, UA: NEGATIVE mg/dL
Ketones, ur: NEGATIVE mg/dL
Nitrite: NEGATIVE
Protein, ur: NEGATIVE mg/dL
Specific Gravity, Urine: 1.012 (ref 1.005–1.030)
pH: 7 (ref 5.0–8.0)

## 2020-10-26 LAB — BASIC METABOLIC PANEL
Anion gap: 8 (ref 5–15)
BUN: 7 mg/dL — ABNORMAL LOW (ref 8–23)
CO2: 25 mmol/L (ref 22–32)
Calcium: 9.6 mg/dL (ref 8.9–10.3)
Chloride: 107 mmol/L (ref 98–111)
Creatinine, Ser: 0.84 mg/dL (ref 0.44–1.00)
GFR, Estimated: >60 mL/min (ref >60)
Glucose, Bld: 100 mg/dL — ABNORMAL HIGH (ref 70–99)
Potassium: 3.5 mmol/L (ref 3.5–5.1)
Sodium: 140 mmol/L (ref 135–145)

## 2020-10-26 LAB — TROPONIN I (HIGH SENSITIVITY)
Troponin I (High Sensitivity): 5 ng/L (ref <18)
Troponin I (High Sensitivity): 5 ng/L (ref <18)

## 2020-10-26 LAB — CBG MONITORING, ED: Glucose-Capillary: 97 mg/dL (ref 70–99)

## 2020-10-26 MED ORDER — SODIUM CHLORIDE 0.9 % IV BOLUS
500.0000 mL | Freq: Once | INTRAVENOUS | Status: AC
  Administered 2020-10-26: 500 mL via INTRAVENOUS

## 2020-10-26 NOTE — ED Provider Notes (Signed)
Patient signed out to me by previous provider.  Please refer to their note for full HPI.  Brief this is a 75 year old female who had an episode of lightheadedness/dizziness and a remote isolated episode of chest pain.  Work-up thus far has been negative and reassuring.  Head CT showed no acute finding, blood work and cardiac work-up is normal.  We are pending interrogation of her pacemaker and urinalysis.  She is currently asymptomatic and at baseline, no complaints. Physical Exam  BP 140/76 (BP Location: Right Arm)   Pulse 72   Temp 98.5 F (36.9 C) (Oral)   Resp 16   SpO2 98%   Physical Exam Vitals and nursing note reviewed.  Constitutional:      General: She is not in acute distress.    Appearance: Normal appearance. She is not ill-appearing or toxic-appearing.  HENT:     Head: Normocephalic.     Mouth/Throat:     Mouth: Mucous membranes are moist.  Cardiovascular:     Rate and Rhythm: Normal rate.  Pulmonary:     Effort: Pulmonary effort is normal. No respiratory distress.  Abdominal:     Palpations: Abdomen is soft.     Tenderness: There is no abdominal tenderness.  Musculoskeletal:     Right lower leg: No edema.     Left lower leg: No edema.  Skin:    General: Skin is warm.  Neurological:     Mental Status: She is alert and oriented to person, place, and time. Mental status is at baseline.  Psychiatric:        Mood and Affect: Mood normal.     ED Course/Procedures     Procedures  MDM  Pacemaker interrogation shows no recent events or arrhythmias.  Vitals are stable, her physical exam is benign and very reassuring.  Urinalysis shows small amounts of bacteria and leukocytes, she is asymptomatic, will send urine culture and hold treatment.  This is patient's preference.  She offers no new complaints or concerns, states that she feels well.  Patient will be discharged and treated as an outpatient.  Discharge plan and strict return to ED precautions discussed, patient  verbalizes understanding and agreement.      Lorelle Gibbs, DO 10/26/20 1812

## 2020-10-26 NOTE — ED Triage Notes (Signed)
Pt presents with not feeling good, dizziness and feeling lightheaded upon waking at 0500 this am. Pt also reports intermitentl chest pain to her mid-sternum under Right breast since Friday after mowing her yard. Pt took one nitro. Pt reports last time she had these symptoms she was dx w/a mild stroke and abnormal EKG. Pt also reports tremors in her head at night, had one during the day 2 days ago, described it as waves of shaking. Pt seeing a neurologist for this.   No neuro deficits noted in triage. speech clear, grips equal, no facial droop

## 2020-10-26 NOTE — ED Provider Notes (Signed)
[facility] EMERGENCY DEPARTMENT Provider Note   CSN: [id number] Arrival date & time: 10/26/20  1856     History Chief Complaint  Patient presents with  . Dizziness  . Chest Pain    Renee [name] is a 75 y.o. female.  HPI 75 year old female presents with a chief complaint of dizziness/lightheadedness.  Started this morning around 5 AM when she woke up.  She was nauseated but did not vomit.  She felt both lightheaded but also off balance.  Symptoms have essentially resolved at this point.  Has been having chronic headaches and neck pain that is being worked up as an outpatient.  Recently had a MRI of her cervical spine.  She also has been feeling generally weak for the last several days after mowing her lawn.  Transiently had some chest pain a few days ago but none now.  No focal weakness though she does have chronic tremors and numbness to her fingers bilaterally.  No vision changes.  Past Medical History:  Diagnosis Date  . Allergy    tape, electrodes  . Anxiety   . AV nodal re-entry tachycardia [facility])    a. s/p RFA 1993- Dr. Rolland Porter, [place]  . Broken neck ([place])    1993  . Cancer (HCC)    hx of cervical   ca  . CHF (congestive heart failure) ([place])   . Chronic chest pain    a. 2005 Cath: Mild nonobstructive plaque (30-40% LCX);  b. 08/2011 low risk myoview;  c. 09/2011 Coronary CT: NL cors w ca score of 0.  . Chronic pain syndrome   . Complication of anesthesia    blood pressure and heart dropped in 2009    . Concussion    1979, 1993  . Degenerative joint disease    a. s/p L Total Knee Arthroplasty.  . Diet-controlled type 2 diabetes mellitus ([place])    pt. denies  . Diverticulosis of colon   . Duodenitis without hemorrhage   . Dysrhythmia    REPORTS , SINCE PLACEMENT OF PACEMAKER, STILL CAN FEEL HEART " GOING IN AND OUT OF R[facility]TH[facility]" REPORTS, YESTERDAY HER BP SYSTOLIC WAS IN THE 314H AND WAS BREATHLESS, HR 112, SWEATING ; DENIES SYNCOPE  , DENIES CHEST PAIN    . Fibromyalgia   . GERD (gastroesophageal reflux disease)   . Hemorrhoids   . Hypertension   . Irritable bowel syndrome   . Long QT interval    a. mild - advised to avoid meds that may prolong QT.  Marland Kitchen Myocardial infarction [facility]) 09/1991   does not know when second heart attack was  . Neuropathy   . Osteoporosis   . Paroxysmal A-fib (HCC)    PER PATIENT. I CAN FEEL MY HEART RACING WHEN IM IN IT   . Recurrent upper respiratory infection (URI)   . SVT (supraventricular tachycardia) (HCC)    a. nonsustained SVT - previously offered flecainide but refused;  b. 12/2012 30 day event monitor w/o significant arrhythmias.  . Thyroid nodule     Patient Active Problem List   Diagnosis Date Noted  . Cervical intraepithelial neoplasia grade 1 09/02/2020  . Menopausal syndrome 09/02/2020  . Overactive bladder 09/02/2020  . Postmenopausal bleeding 09/02/2020  . Left hip pain 09/02/2020  . Cellulitis 08/21/2020  . Recurrent UTI 07/21/2020  . Multiple thyroid nodules 01/14/2020  . Epigastric pain 12/10/2019  . Atypical chest pain 12/10/2019  . PVC's (premature ventricular contractions) 08/06/2019  . Hoarseness 07/16/2019  . Sinus  node dysfunction ([place]) 03/11/2019  . Chronic cough 03/05/2019  . Urinary retention 03/05/2019  . Generalized weakness 09/11/2018  . Dysuria 05/09/2018  . Acute sinus infection 04/20/2018  . Other allergic rhinitis 04/20/2018  . Cystocele, midline 08/01/2017  . Hyperglycemia 01/31/2017  . Bilateral foot pain 01/31/2017  . Laryngopharyngeal reflux (LPR) 08/18/2016  . Globus pharyngeus 08/01/2016  . Thyroid nodule 08/01/2016  . Cough 06/24/2016  . Syncope 03/03/2016  . Pacemaker 03/03/2016  . Chest pain at rest 09/13/2015  . Spinal stenosis of lumbar region 09/07/2015  . Raynaud phenomenon 09/07/2015  . Insomnia 09/07/2015  . Preventative health care 12/18/2014  . Paroxysmal spells 01/28/2014  . Dysthymia 01/28/2014  . Injury of leg 10/07/2013  .  Headache(784.0) 03/14/2013  . Eye problem 02/15/2013  . Persistent headaches 02/15/2013  . Low blood sugar 05/31/2012  . Pre-syncope 10/04/2011  . NSVT (nonsustained ventricular tachycardia) ([place]) 10/04/2011  . Dyspnea 10/01/2009  . Diverticulosis of large intestine 09/24/2009  . SWEATING 09/24/2009  . FATIQUE AND MALAISE 09/16/2009  . LONG QT SYNDROME 03/19/2009  . AV NODAL REENTRY TACHYCARDIA 10/03/2008  . Other dysphagia 06/17/2008  . HEMORRHOIDS 06/16/2008  . DUODENITIS, WITHOUT HEMORRHAGE 06/16/2008  . CONSTIPATION, CHRONIC 06/16/2008  . THYROID NODULE, right lobe. 05/26/2008  . Chronic pain syndrome 05/26/2008  . Coronary atherosclerosis 05/26/2008  . BRONCHITIS, RECURRENT 05/26/2008  . Irritable bowel syndrome 05/26/2008  . FECAL INCONTINENCE 05/26/2008  . Anxiety state 03/24/2008  . Essential hypertension 03/24/2008  . GERD 03/24/2008  . DEGENERATIVE JOINT DISEASE 03/24/2008  . BACK PAIN, LUMBAR 03/24/2008  . Fibromyalgia 03/24/2008  . Chest pain 03/24/2008  . NEUROPATHY, HX OF 03/24/2008    Past Surgical History:  Procedure Laterality Date  . ABLATION OF DYSRHYTHMIC FOCUS  1993  . ANTERIOR AND POSTERIOR REPAIR N/A 08/01/2017   Procedure: ANTERIOR (CYSTOCELE) AND POSTERIOR REPAIR (RECTOCELE);  Surgeon: Bjorn Loser, MD;  Location: [facility];  Service: Urology;  Laterality: N/A;  . APPENDECTOMY     1980  . BILATERAL SALPINGECTOMY Left 08/01/2017   Procedure: SALPINGECTOMY;  Surgeon: Bobbye Charleston, MD;  Location: [facility];  Service: Gynecology;  Laterality: Left;  . BIOPSY THYROID    . CARDIAC ELECTROPHYSIOLOGY STUDY AND ABLATION     atrioventricular nodal reentant tachycardia  . CHOLECYSTECTOMY  1980  . COLONOSCOPY  06/25/2008   Diverticulosis and Hemorrhoids  . COLPOSCOPY    . CORONARY ANGIOPLASTY  2007  . CYSTOSCOPY N/A 08/01/2017   Procedure: CYSTOSCOPY;  Surgeon: Bjorn Loser, MD;  Location: [facility];  Service: Urology;  Laterality: N/A;  . EP  IMPLANTABLE DEVICE N/A 03/03/2016   Procedure: Pacemaker Implant;  Surgeon: Evans Lance, MD;  Location: [place] [facility];  Service: Cardiovascular;  Laterality: N/A;  . ESOPHAGOGASTRODUODENOSCOPY    . HYSTEROSCOPY  2002   with resection of endometrial polyps. by Dr.MCPhail  . HYSTEROSCOPY WITH D & C N/A 02/05/2015   Procedure: DILATATION AND CURETTAGE /HYSTEROSCOPY;  Surgeon: Vanessa Kick, MD;  Location: [place] [facility];  Service: Gynecology;  Laterality: N/A;  . LOOP RECORDER IMPLANT  08/21/2013   MDT LinQ implanted by Dr Lovena Le for syncope  . LOOP RECORDER IMPLANT N/A 08/21/2013   Procedure: LOOP RECORDER IMPLANT;  Surgeon: Evans Lance, MD;  Location: [facility] C[facility] LAB;  Service: Cardiovascular;  Laterality: N/A;  . ORIF RADIAL HEAD / NECK FRACTURE  09/05/88  . TILT TABLE STUDY N/A 05/15/2013   Procedure: TILT TABLE STUDY;  Surgeon: Deboraha Sprang, MD;  Location: [facility] CATH  LAB;  Service: Cardiovascular;  Laterality: N/A;  . TOTAL HIP ARTHROPLASTY  10/10   Left, at [place]    . VAGINAL HYSTERECTOMY N/A 08/01/2017   Procedure: HYSTERECTOMY VAGINAL;  Surgeon: Bobbye Charleston, MD;  Location: [facility];  Service: Gynecology;  Laterality: N/A;     OB History    Gravida  2   Para  2   Term      Preterm      AB      Living  2     SAB      IAB      Ectopic      Multiple      Live Births              Family History  Problem Relation Age of Onset  . Ovarian cancer Mother   . Stroke Mother   . Diabetes Mother   . Asthma Mother   . Lung cancer Father        Heart problems  . Heart disease Sister   . Diabetes Brother   . Diabetes Sister        Pacemaker, CHF  . Heart disease Sister   . Asthma Sister   . Colon cancer Neg Hx   . Esophageal cancer Neg Hx   . Stomach cancer Neg Hx   . Rectal cancer Neg Hx     Social History   Tobacco Use  . Smoking status: Never Smoker  . Smokeless tobacco: Never Used  Vaping Use  . Vaping Use:  Never used  Substance Use Topics  . Alcohol use: Not Currently    Comment: Occasional   . Drug use: No    Home Medications Prior to Admission medications   Medication Sig Start Date End Date Taking? Authorizing Provider  ACCU-CHEK FASTCLIX LANCETS MISC USE UP TO 4 TIMES DAILY AS  DIRECTED 04/11/18  Yes Biagio Borg, MD  ACCU-CHEK GUIDE test strip USE UP TO 4 TIMES DAILY AS  DIRECTED 04/11/18  Yes Biagio Borg, MD  ALPRAZolam Duanne Moron) 0.5 MG tablet Take 0.5-1 tablets (0.25-0.5 mg total) by mouth 2 (two) times daily as needed for anxiety. 10/02/19  Yes Biagio Borg, MD  aspirin EC 81 MG tablet Take 81 mg by mouth daily.   Yes [provider]  blood glucose meter kit and supplies KIT Dispense per patient and insurance preferred. Use up to four times daily as directed. E11.9 02/21/18  Yes Biagio Borg, MD  cetirizine (ZYRTEC) 10 MG tablet Take 10 mg by mouth daily as needed.   Yes [provider]  diltiazem (CARDIZEM CD) 240 MG 24 hr capsule TAKE 1 CAPSULE BY MOUTH IN  THE MORNING 02/10/20  Yes Larey Dresser, MD  diltiazem (CARDIZEM) 30 MG tablet Take 2 tablets (60 mg total) by mouth 4 (four) times daily as needed (as needed for breakthrough heart racing/palpitations). 09/11/19  Yes Evans Lance, MD  flecainide (TAMBOCOR) 50 MG tablet Take 1 tablet (50 mg total) by mouth 2 (two) times daily. 04/16/19  Yes Evans Lance, MD  furosemide (LASIX) 20 MG tablet Take 20 mg by mouth daily as needed for fluid.   Yes [provider]  losartan (COZAAR) 50 MG tablet Take 1 tablet (50 mg total) by mouth 2 (two) times daily. 07/02/20  Yes Larey Dresser, MD  nitroGLYCERIN (NITROSTAT) 0.4 MG SL tablet Place 1 tablet (0.4 mg total) under the tongue every  5 (five) minutes as needed for chest pain. 09/01/20  Yes Larey Dresser, MD  omeprazole (PRILOSEC) 40 MG capsule Take 1 capsule (40 mg total) by mouth in the morning and at bedtime. 12/24/19  Yes Ladene Artist, MD   trimethoprim (TRIMPEX) 100 MG tablet Take 100 mg by mouth daily as needed (For UTI per patient). 06/24/20  Yes [provider]  zolpidem (AMBIEN) 5 MG tablet TAKE 1 TABLET BY MOUTH AT  BEDTIME AS NEEDED FOR SLEEP Patient taking differently: Take 5 mg by mouth at bedtime as needed for sleep. 08/01/19  Yes Biagio Borg, MD    Allergies    Cyclobenzaprine, Azithromycin, Nitrofurantoin, Adhesive [tape], Moxifloxacin, and Quinolones  Review of Systems   Review of Systems  Eyes: Negative for visual disturbance.  Cardiovascular: Negative for chest pain.  Gastrointestinal: Positive for nausea. Negative for vomiting.  Musculoskeletal: Positive for neck pain.  Neurological: Positive for dizziness, light-headedness, numbness and headaches. Negative for weakness.  All other systems reviewed and are negative.   Physical Exam Updated Vital Signs BP 136/60   Pulse 63   Temp 97.8 F (36.6 C) (Oral)   Resp 19   SpO2 99%   Physical Exam Vitals and nursing note reviewed.  Constitutional:      General: She is not in acute distress.    Appearance: She is well-developed. She is not ill-appearing or diaphoretic.  HENT:     Head: Normocephalic and atraumatic.     Right Ear: External ear normal.     Left Ear: External ear normal.     Nose: Nose normal.  Eyes:     General:        Right eye: No discharge.        Left eye: No discharge.     Extraocular Movements: Extraocular movements intact.     Pupils: Pupils are equal, round, and reactive to light.  Cardiovascular:     Rate and Rhythm: Normal rate and regular rhythm.     Heart sounds: Normal heart sounds.  Pulmonary:     Effort: Pulmonary effort is normal.     Breath sounds: Normal breath sounds.  Abdominal:     Palpations: Abdomen is soft.     Tenderness: There is no abdominal tenderness.  Skin:    General: Skin is warm and dry.  Neurological:     Mental Status: She is alert.     Comments: CN 3-12 grossly intact. 5/5  strength in all 4 extremities. Grossly normal sensation. Normal finger to nose. Normal gait  Psychiatric:        Mood and Affect: Mood is not anxious.     ED Results / Procedures / Treatments   Labs (all labs ordered are listed, but only abnormal results are displayed) Labs Reviewed  BASIC METABOLIC PANEL - Abnormal; Notable for the following components:      Result Value   Glucose, Bld 100 (*)    BUN 7 (*)    All other components within normal limits  CBC  URINALYSIS, ROUTINE W REFLEX MICROSCOPIC  CBG MONITORING, ED  TROPONIN I (HIGH SENSITIVITY)  TROPONIN I (HIGH SENSITIVITY)    EKG EKG Interpretation  Date/Time:  Monday Oct 26 2020 08:17:41 EDT Ventricular Rate:  72 PR Interval:  186 QRS Duration: 88 QT Interval:  440 QTC Calculation: 481 R Axis:   48 Text Interpretation: Normal sinus rhythm Low voltage QRS Cannot rule out Inferior infarct , age undetermined Abnormal ECG ST/T changes similar to  Mar 2022 Confirmed by Sherwood Gambler [phone]) on 10/26/2020 10:44:00 AM   Radiology CT HEAD WO CONTRAST  Result Date: 10/26/2020 CLINICAL DATA:  Dizziness, non-specific EXAM: CT HEAD WITHOUT CONTRAST TECHNIQUE: Contiguous axial images were obtained from the base of the skull through the vertex without intravenous contrast. COMPARISON:  Head CT 03/31/2020 FINDINGS: Brain: No evidence of acute infarction, hemorrhage, hydrocephalus, extra-axial collection or mass lesion/mass effect.Scattered subcortical and periventricular white matter hypodensities, nonspecific but likely sequela of small vessel ischemic disease. Vascular: No hyperdense vessel or unexpected calcification. Skull: Normal. Negative for fracture or focal lesion. Sinuses/Orbits: No acute finding. Other: None. IMPRESSION: No acute intracranial abnormality. Unchanged mild atrophy and likely sequela of chronic small vessel ischemic change. Electronically Signed   By: Maurine Simmering   On: 10/26/2020 10:07    Procedures Procedures    Medications Ordered in ED Medications  sodium chloride 0.9 % bolus 500 mL (0 mLs Intravenous Stopped 10/26/20 1317)    ED Course  I have reviewed the triage vital signs and the nursing notes.  Pertinent labs & imaging results that were available during my care of the patient were reviewed by me and considered in my medical decision making (see chart for details).    MDM Rules/Calculators/A&P                          Patient presents with transient dizziness.  She is able to ambulate here without difficulty.  Feels much better.  We discussed possibility of MRI but she declines this.  Otherwise she states it did not really feel like vertigo but more that she was so lightheaded.  This feels like when she needed the pacemaker originally.  We are currently awaiting the rep to interrogate her pacemaker and if this is unremarkable I think she can be discharged given how well she feels.  Care to Dr. Dina Rich. Final Clinical Impression(s) / ED Diagnoses Final diagnoses:  None    Rx / DC Orders ED Discharge Orders    None       Sherwood Gambler, MD 10/26/20 1530

## 2020-10-26 NOTE — Discharge Instructions (Addendum)
You have been seen and discharged from the emergency department.  Your blood work, CAT scan and EKG were normal for you.  Follow-up with your primary provider for reevaluation and further care. Take home medications as prescribed. If you have any worsening symptoms, recurrent/persistent dizziness, severe chest pain or further concerns for your health please return to an emergency department for further evaluation.

## 2020-10-26 NOTE — ED Triage Notes (Signed)
Pt also reports chronic neck pain, seeing a neurologist for it, suggested she follow up with a PT. Pt states they haven't called her back to set up her appt for PT

## 2020-10-28 LAB — URINE CULTURE: Culture: 60000 — AB

## 2020-10-29 ENCOUNTER — Other Ambulatory Visit: Payer: Self-pay

## 2020-10-29 ENCOUNTER — Ambulatory Visit: Payer: Medicare Other | Attending: Neurosurgery | Admitting: Physical Therapy

## 2020-10-29 ENCOUNTER — Telehealth: Payer: Self-pay | Admitting: Emergency Medicine

## 2020-10-29 DIAGNOSIS — R293 Abnormal posture: Secondary | ICD-10-CM | POA: Insufficient documentation

## 2020-10-29 DIAGNOSIS — M6281 Muscle weakness (generalized): Secondary | ICD-10-CM | POA: Insufficient documentation

## 2020-10-29 DIAGNOSIS — R252 Cramp and spasm: Secondary | ICD-10-CM | POA: Insufficient documentation

## 2020-10-29 DIAGNOSIS — M542 Cervicalgia: Secondary | ICD-10-CM | POA: Diagnosis not present

## 2020-10-29 NOTE — Telephone Encounter (Signed)
Post ED Visit - Positive Culture Follow-up: Successful Patient Follow-Up  Culture assessed and recommendations reviewed by:  []  Elenor Quinones, Pharm.D. []  Heide Guile, Pharm.D., BCPS AQ-ID []  Parks Neptune, Pharm.D., BCPS []  Alycia Rossetti, Pharm.D., BCPS []  [place], [place].D., BCPS, AAHIVP []  Legrand Como, Pharm.D., BCPS, AAHIVP []  Salome Arnt, PharmD, BCPS []  Johnnette Gourd, PharmD, BCPS []  Hughes Better, PharmD, BCPS []  Leeroy Cha, PharmD  Positive urine culture  []  Patient discharged without antimicrobial prescription and treatment is now indicated []  Organism is resistant to prescribed ED discharge antimicrobial []  Patient with positive blood cultures  Changes discussed with ED provider: Dr Cato Mulligan New antibiotic prescription symptom check, if none, no treatment, if frequency or dysuria, start amoxicillin 500mg  po tid x 5 days Attempting to contact patient    Hazle Nordmann 10/29/2020, 9:46 AM

## 2020-10-29 NOTE — Therapy (Signed)
[facility] [address], [place], [place], [address] Phone: [phone]   Fax:  [phone]  Physical Therapy Evaluation  Patient Details  Name: Renee [name] MRN: [id number] Date of Birth: [birth year] Referring Provider (PT): Ashok Pall, MD   Encounter Date: 10/29/2020   PT End of Session - 10/29/20 1715    Visit Number 1    Date for PT Re-Evaluation 11/27/20    Authorization Type [employer] Medicare    Progress Note Due on Visit 10    PT Start Time 1617    PT Stop Time 1657    PT Time Calculation (min) 40 min    Activity Tolerance Patient limited by pain           Past Medical History:  Diagnosis Date  . Allergy    tape, electrodes  . Anxiety   . AV nodal re-entry tachycardia [facility])    a. s/p RFA 1993- Dr. Rolland Porter, [place]  . Broken neck ([place])    1993  . Cancer (HCC)    hx of cervical   ca  . CHF (congestive heart failure) ([place])   . Chronic chest pain    a. 2005 Cath: Mild nonobstructive plaque (30-40% LCX);  b. 08/2011 low risk myoview;  c. 09/2011 Coronary CT: NL cors w ca score of 0.  . Chronic pain syndrome   . Complication of anesthesia    blood pressure and heart dropped in 2009    . Concussion    1979, 1993  . Degenerative joint disease    a. s/p L Total Knee Arthroplasty.  . Diet-controlled type 2 diabetes mellitus ([place])    pt. denies  . Diverticulosis of colon   . Duodenitis without hemorrhage   . Dysrhythmia    REPORTS , SINCE PLACEMENT OF PACEMAKER, STILL CAN FEEL HEART " GOING IN AND OUT OF R[facility]THYN" REPORTS, YESTERDAY HER BP SYSTOLIC WAS IN THE [id number] AND WAS BREATHLESS, HR 112, SWEATING ; DENIES SYNCOPE  , DENIES CHEST PAIN   . Fibromyalgia   . GERD (gastroesophageal reflux disease)   . Hemorrhoids   . Hypertension   . Irritable bowel syndrome   . Long QT interval    a. mild - advised to avoid meds that may prolong QT.  Marland Kitchen Myocardial infarction [facility]) 09/1991   does not know when second heart  attack was  . Neuropathy   . Osteoporosis   . Paroxysmal A-fib (HCC)    PER PATIENT. I CAN FEEL MY HEART RACING WHEN IM IN IT   . Recurrent upper respiratory infection (URI)   . SVT (supraventricular tachycardia) (HCC)    a. nonsustained SVT - previously offered flecainide but refused;  b. 12/2012 30 day event monitor w/o significant arrhythmias.  . Thyroid nodule     Past Surgical History:  Procedure Laterality Date  . ABLATION OF DYSRHYTHMIC FOCUS  1993  . ANTERIOR AND POSTERIOR REPAIR N/A 08/01/2017   Procedure: ANTERIOR (CYSTOCELE) AND POSTERIOR REPAIR (RECTOCELE);  Surgeon: Bjorn Loser, MD;  Location: [facility];  Service: Urology;  Laterality: N/A;  . APPENDECTOMY     1980  . BILATERAL SALPINGECTOMY Left 08/01/2017   Procedure: SALPINGECTOMY;  Surgeon: Bobbye Charleston, MD;  Location: [facility];  Service: Gynecology;  Laterality: Left;  . BIOPSY THYROID    . CARDIAC ELECTROPHYSIOLOGY STUDY AND ABLATION     atrioventricular nodal reentant tachycardia  . CHOLECYSTECTOMY  1980  . COLONOSCOPY  06/25/2008   Diverticulosis and Hemorrhoids  .  COLPOSCOPY    . CORONARY ANGIOPLASTY  2007  . CYSTOSCOPY N/A 08/01/2017   Procedure: CYSTOSCOPY;  Surgeon: Bjorn Loser, MD;  Location: [facility];  Service: Urology;  Laterality: N/A;  . EP IMPLANTABLE DEVICE N/A 03/03/2016   Procedure: Pacemaker Implant;  Surgeon: Evans Lance, MD;  Location: [facility];  Service: Cardiovascular;  Laterality: N/A;  . ESOPHAGOGASTRODUODENOSCOPY    . HYSTEROSCOPY  2002   with resection of endometrial polyps. by Dr.MCPhail  . HYSTEROSCOPY WITH D & C N/A 02/05/2015   Procedure: DILATATION AND CURETTAGE /HYSTEROSCOPY;  Surgeon: Vanessa Kick, MD;  Location: [facility];  Service: Gynecology;  Laterality: N/A;  . LOOP RECORDER IMPLANT  08/21/2013   MDT LinQ implanted by Dr Lovena Le for syncope  . LOOP RECORDER IMPLANT N/A 08/21/2013   Procedure: LOOP RECORDER IMPLANT;  Surgeon: Evans Lance, MD;  Location: [facility] CATH  LAB;  Service: Cardiovascular;  Laterality: N/A;  . ORIF RADIAL HEAD / NECK FRACTURE  09/05/88  . TILT TABLE STUDY N/A 05/15/2013   Procedure: TILT TABLE STUDY;  Surgeon: Deboraha Sprang, MD;  Location: [facility] CATH LAB;  Service: Cardiovascular;  Laterality: N/A;  . TOTAL HIP ARTHROPLASTY  10/10   Left, at [place]    . VAGINAL HYSTERECTOMY N/A 08/01/2017   Procedure: HYSTERECTOMY VAGINAL;  Surgeon: Bobbye Charleston, MD;  Location: [facility];  Service: Gynecology;  Laterality: N/A;    There were no vitals filed for this visit.    Subjective Assessment - 10/29/20 1619    Subjective Patient presenting due to Lt sided cervical pain. Patient reports that she fractured her neck and had a TBI in the 90s. States that TBI resulted in short term memory loss. Was in a halo for an extended period. Patient states that she has always had some level of neck pain but this worsened significantly after having COVID in January of 2021. Pain is Lt sided and radiates superiorly into occipital and parietal region. She states that she has intermittent tremors that run across the Rt side of her face. Has previously undergone physical therapy to address this issue where traction and heat were helpful.    Diagnostic tests MRI - Multilevel disc and facet degeneration throughout the lumbar spine.  There is significant foraminal encroachment bilaterally at multiple  levels due to spurring. This is most severe C7-T1 with bilateral C8  nerve root compression    Currently in Pain? Yes    Pain Score 5     Pain Location Neck    Pain Orientation Left    Pain Descriptors / Indicators Aching              [facility] PT Assessment - 10/29/20 0001      Assessment   Medical Diagnosis M47.22 (ICD-10-CM) - Other spondylosis with radiculopathy, cervical region    Referring Provider (PT) Ashok Pall, MD    Hand Dominance Left    Next MD Visit In two months    Prior Therapy Yes      Precautions    Precautions ICD/Pacemaker      Restrictions   Weight Bearing Restrictions No      Balance Screen   Has the patient fallen in the past 6 months No    Has the patient had a decrease in activity level because of a fear of falling?  No    Is the patient reluctant to leave their home because of a fear of falling?  No  Home Environment   Living Environment Private residence    Living Arrangements Alone      Prior Function   Level of Independence Independent    Vocation Part time employment    Vocation Requirements Extended computer use      Cognition   Overall Cognitive Status Within Functional Limits for tasks assessed      Posture/Postural Control   Posture/Postural Control Postural limitations    Postural Limitations Forward head;Rounded Shoulders      ROM / Strength   AROM / PROM / Strength AROM;Strength      AROM   AROM Assessment Site Shoulder;Cervical    Cervical Flexion 24    Cervical Extension 8    Cervical - Right Rotation 20    Cervical - Left Rotation 20      Strength   Strength Assessment Site Cervical;Shoulder    Right Shoulder Flexion 4/5    Right Shoulder ABduction 4/5    Right Shoulder Internal Rotation 4+/5    Right Shoulder External Rotation 4+/5    Left Shoulder Flexion 4+/5    Left Shoulder ABduction 4+/5    Left Shoulder Internal Rotation 4+/5    Left Shoulder External Rotation 4+/5    Cervical Flexion 5/5    Cervical Extension 5/5    Cervical - Right Side Bend 5/5    Cervical - Left Side Bend 5/5    Cervical - Right Rotation 5/5    Cervical - Left Rotation 5/5      Palpation   Spinal mobility hypomobility at C3-4 and C6-7    Palpation comment tender to palpation with PAs at C4,5, and C7      Special Tests    Special Tests Cervical      Spurling's   Findings Positive    Side Left    Comment Positive Rt also      Bed Mobility   Bed Mobility Right Sidelying to Sit    Right Sidelying to Sit Minimal Assistance - Patient > 75%                       Objective measurements completed on examination: See above findings.               PT Education - 10/29/20 1652    Education Details Access Code: [email or web address]    Methods Explanation;Demonstration;Tactile cues;Verbal cues;Handout    Comprehension Verbalized understanding;Returned demonstration;Verbal cues required;Tactile cues required            PT Short Term Goals - 10/29/20 1710      PT SHORT TERM GOAL #1   Title Patient will be independent with HEP for continued progression at home.    Time 2    Period Weeks    Status New    Target Date 11/12/20      PT SHORT TERM GOAL #2   Title Patient will report no more than 4/10 pain at end of day to indicate improved activity tolerance.    Time 2    Period Weeks    Status New    Target Date 11/12/20      PT SHORT TERM GOAL #3   Title Patient will demonstrate 30 degrees cervical rotation bilaterally to indicate improved mobility for ADLs.    Baseline 20 degrees bilateral    Time 2    Period Weeks    Status New    Target Date 11/12/20  PT Long Term Goals - 10/29/20 1713      PT LONG TERM GOAL #1   Title Patient will be independent with advanced HEP for long term management of symptoms post D/C.    Time 4    Period Weeks    Status New    Target Date 11/26/20      PT LONG TERM GOAL #2   Title Patient will demonstrate 35 degrees of cervical flexion, extension, right rotation, and left rotation for improved mobility for ADLs.    Time 4    Period Weeks    Status New    Target Date 11/26/20      PT LONG TERM GOAL #3   Title Patient will appropriately demonstrate log roll into sidelying and then sitting in either direction without assistance for improved bed mobility with decreased cervical strain.    Baseline min assist for Rt sidelying to sit    Time 4    Period Weeks    Status New    Target Date 11/26/20                  Plan - 10/29/20 1703    Clinical  Impression Statement Patient is a 75 y/o female referred due to cervical radiculopathy. PMH includes HTN, pacemaker, neuropathy, and chronic headache. Patient reports pain with all activities. During session patient reporting pain with bed mobility. Therapist cuing for log roll into sidelying with patient requiring min A to transition Rt sidelying to sit. She demonstrates significant cervical AROM deficits as well as Rt UE strength impairments. She demonstrates no gross cervical strength impairments. Patient requiring tactile and verbal cues for decreased valsalva manuever when attempting supine cervical retraction which indicates impaired activation of deep cervical flexors. Would benefit from skilled therapeutic intervention to address impairments for improved bed mobility and functional activity tolerance.    Personal Factors and Comorbidities Comorbidity 3+    Comorbidities HTN, pacemaker, neuropathy, chronic headache    Examination-Activity Limitations Bed Mobility;Reach Overhead    Examination-Participation Restrictions Occupation    Stability/Clinical Decision Making Evolving/Moderate complexity    Clinical Decision Making Moderate    Rehab Potential Fair    PT Frequency 2x / week    PT Duration 4 weeks    PT Treatment/Interventions ADLs/Self Care Home Management;Aquatic Therapy;Cryotherapy;Electrical Stimulation;Iontophoresis 4mg /ml Dexamethasone;Moist Heat;Traction;Functional mobility training;Therapeutic activities;Therapeutic exercise;Neuromuscular re-education;Patient/family education;Manual techniques;Passive range of motion;Dry needling;Taping;Spinal Manipulations;Joint Manipulations    PT Next Visit Plan establish FOTO goal; introduce DN if patient agrees; traction as patient reports positive response in previous therapy episodes    PT Home Exercise Plan [email or web address]    Consulted and Agree with Plan of Care Patient           Patient will benefit from skilled therapeutic intervention in  order to improve the following deficits and impairments:  Decreased activity tolerance,Decreased range of motion,Decreased strength,Hypomobility,Increased fascial restricitons,Increased muscle spasms,Impaired UE functional use,Postural dysfunction,Pain  Visit Diagnosis: Abnormal posture - Plan: PT plan of care cert/re-cert  Cervicalgia - Plan: PT plan of care cert/re-cert  Cramp and spasm - Plan: PT plan of care cert/re-cert  Muscle weakness (generalized) - Plan: PT plan of care cert/re-cert     Problem List Patient Active Problem List   Diagnosis Date Noted  . Cervical intraepithelial neoplasia grade 1 09/02/2020  . Menopausal syndrome 09/02/2020  . Overactive bladder 09/02/2020  . Postmenopausal bleeding 09/02/2020  . Left hip pain 09/02/2020  . Cellulitis 08/21/2020  . Recurrent UTI 07/21/2020  . Multiple thyroid nodules  01/14/2020  . Epigastric pain 12/10/2019  . Atypical chest pain 12/10/2019  . PVC's (premature ventricular contractions) 08/06/2019  . Hoarseness 07/16/2019  . Sinus node dysfunction ([place]) 03/11/2019  . Chronic cough 03/05/2019  . Urinary retention 03/05/2019  . Generalized weakness 09/11/2018  . Dysuria 05/09/2018  . Acute sinus infection 04/20/2018  . Other allergic rhinitis 04/20/2018  . Cystocele, midline 08/01/2017  . Hyperglycemia 01/31/2017  . Bilateral foot pain 01/31/2017  . Laryngopharyngeal reflux (LPR) 08/18/2016  . Globus pharyngeus 08/01/2016  . Thyroid nodule 08/01/2016  . Cough 06/24/2016  . Syncope 03/03/2016  . Pacemaker 03/03/2016  . Chest pain at rest 09/13/2015  . Spinal stenosis of lumbar region 09/07/2015  . Raynaud phenomenon 09/07/2015  . Insomnia 09/07/2015  . Preventative health care 12/18/2014  . Paroxysmal spells 01/28/2014  . Dysthymia 01/28/2014  . Injury of leg 10/07/2013  . Headache(784.0) 03/14/2013  . Eye problem 02/15/2013  . Persistent headaches 02/15/2013  . Low blood sugar 05/31/2012  . Pre-syncope  10/04/2011  . NSVT (nonsustained ventricular tachycardia) ([place]) 10/04/2011  . Dyspnea 10/01/2009  . Diverticulosis of large intestine 09/24/2009  . SWEATING 09/24/2009  . FATIQUE AND MALAISE 09/16/2009  . LONG QT SYNDROME 03/19/2009  . AV NODAL REENTRY TACHYCARDIA 10/03/2008  . Other dysphagia 06/17/2008  . HEMORRHOIDS 06/16/2008  . DUODENITIS, WITHOUT HEMORRHAGE 06/16/2008  . CONSTIPATION, CHRONIC 06/16/2008  . THYROID NODULE, right lobe. 05/26/2008  . Chronic pain syndrome 05/26/2008  . Coronary atherosclerosis 05/26/2008  . BRONCHITIS, RECURRENT 05/26/2008  . Irritable bowel syndrome 05/26/2008  . FECAL INCONTINENCE 05/26/2008  . Anxiety state 03/24/2008  . Essential hypertension 03/24/2008  . GERD 03/24/2008  . DEGENERATIVE JOINT DISEASE 03/24/2008  . BACK PAIN, LUMBAR 03/24/2008  . Fibromyalgia 03/24/2008  . Chest pain 03/24/2008  . NEUROPATHY, HX OF 03/24/2008   Everardo All PT, DPT  10/29/20 5:20 PM    [facility] [address], [place], [place], [address] Phone: [phone]   Fax:  [phone]  Name: Renee [name] MRN: [id number] Date of Birth: 1946/03/26

## 2020-10-29 NOTE — Patient Instructions (Addendum)
Trigger Point Dry Needling  . What is Trigger Point Dry Needling (DN)? o DN is a physical therapy technique used to treat muscle pain and dysfunction. Specifically, DN helps deactivate muscle trigger points (muscle knots).  o A thin filiform needle is used to penetrate the skin and stimulate the underlying trigger point. The goal is for a local twitch response (LTR) to occur and for the trigger point to relax. No medication of any kind is injected during the procedure.   . What Does Trigger Point Dry Needling Feel Like?  o The procedure feels different for each individual patient. Some patients report that they do not actually feel the needle enter the skin and overall the process is not painful. Very mild bleeding may occur. However, many patients feel a deep cramping in the muscle in which the needle was inserted. This is the local twitch response.   Marland Kitchen How Will I feel after the treatment? o Soreness is normal, and the onset of soreness may not occur for a few hours. Typically this soreness does not last longer than two days.  o Bruising is uncommon, however; ice can be used to decrease any possible bruising.  o In rare cases feeling tired or nauseous after the treatment is normal. In addition, your symptoms may get worse before they get better, this period will typically not last longer than 24 hours.   . What Can I do After My Treatment? o Increase your hydration by drinking more water for the next 24 hours. o You may place ice or heat on the areas treated that have become sore, however, do not use heat on inflamed or bruised areas. Heat often brings more relief post needling. o You can continue your regular activities, but vigorous activity is not recommended initially after the treatment for 24 hours. o DN is best combined with other physical therapy such as strengthening, stretching, and other therapies.    Access Code: [email or web address] URL: [email or web address] Date:  10/29/2020 Prepared by: Everardo All  Exercises Supine Cervical Rotation AROM on Pillow - 2 x daily - 7 x weekly - 1 sets - 10 reps Supine Head Nod with Deep Neck Flexor Activation - 2 x daily - 7 x weekly - 1 sets - 10 reps Supine Cervical Retraction with Towel - 2 x daily - 7 x weekly - 1 sets - 5 reps Supine Deep Neck Flexor Nods - 2 x daily - 7 x weekly - 1 sets - 10 reps

## 2020-10-29 NOTE — Progress Notes (Signed)
ED Antimicrobial Stewardship Positive Culture Follow Up   Renee [name] is an 75 y.o. female who presented to [facility] on 10/26/2020 with a chief complaint of dizziness, lightheadedness, chest pain. No urinary symptoms.   Chief Complaint  Patient presents with  . Dizziness  . Chest Pain    Recent Results (from the past 720 hour(s))  Urine culture     Status: Abnormal   Collection Time: 10/26/20  4:56 PM   Specimen: Urine, Random  Result Value Ref Range Status   Specimen Description URINE, RANDOM  Final   Special Requests   Final    NONE Performed at [facility], [address] [place],  [address]    Culture 60,000 COLONIES/mL VIRIDANS STREPTOCOCCUS (A)  Final   Report Status 10/28/2020 FINAL  Final    [x]  Patient discharged with prescription for prn trimethoprim for UTI  Call patient for symptom check:  -If none, continue current therapy -If new urinary symptoms (ex. Frequency/dysuria), start amoxicillin 500 mg TID x5 days  ED Provider: Cato Mulligan, MD   Christy Gentles 10/29/2020, 8:54 AM Clinical Pharmacist Monday - Friday phone -  [phone] Saturday - Sunday phone - [phone]

## 2020-11-04 ENCOUNTER — Ambulatory Visit: Payer: Medicare Other

## 2020-11-04 ENCOUNTER — Other Ambulatory Visit: Payer: Self-pay

## 2020-11-04 DIAGNOSIS — M6281 Muscle weakness (generalized): Secondary | ICD-10-CM | POA: Diagnosis not present

## 2020-11-04 DIAGNOSIS — R252 Cramp and spasm: Secondary | ICD-10-CM

## 2020-11-04 DIAGNOSIS — R293 Abnormal posture: Secondary | ICD-10-CM | POA: Diagnosis not present

## 2020-11-04 DIAGNOSIS — M542 Cervicalgia: Secondary | ICD-10-CM

## 2020-11-04 NOTE — Therapy (Signed)
[facility] [address] [place], [place], [address] Phone: [phone]   Fax:  [phone]  Physical Therapy Treatment  Patient Details  Name: Renee [name] MRN: [id number] Date of Birth: [birth year] Referring Provider (PT): Ashok Pall, MD   Encounter Date: 11/04/2020   PT End of Session - 11/04/20 0848    Visit Number 2    Date for PT Re-Evaluation 11/27/20    Authorization Type U[employer] Medicare    Progress Note Due on Visit 10    PT Start Time 0802    PT Stop Time 0847    PT Time Calculation (min) 45 min    Activity Tolerance Patient tolerated treatment well    Behavior During Therapy [facility] for tasks assessed/performed           Past Medical History:  Diagnosis Date  . Allergy    tape, electrodes  . Anxiety   . AV nodal re-entry tachycardia [facility])    a. s/p RFA 1993- Dr. Rolland Porter, [place]  . Broken neck ([place])    1993  . Cancer (HCC)    hx of cervical   ca  . CHF (congestive heart failure) ([place])   . Chronic chest pain    a. 2005 Cath: Mild nonobstructive plaque (30-40% LCX);  b. 08/2011 low risk myoview;  c. 09/2011 Coronary CT: NL cors w ca score of 0.  . Chronic pain syndrome   . Complication of anesthesia    blood pressure and heart dropped in 2009    . Concussion    1979, 1993  . Degenerative joint disease    a. s/p L Total Knee Arthroplasty.  . Diet-controlled type 2 diabetes mellitus ([place])    pt. denies  . Diverticulosis of colon   . Duodenitis without hemorrhage   . Dysrhythmia    REPORTS , SINCE PLACEMENT OF PACEMAKER, STILL CAN FEEL HEART " GOING IN AND OUT OF RYTHYN" REPORTS, YESTERDAY HER BP SYSTOLIC WAS IN THE 924Q AND WAS BREATHLESS, HR 112, SWEATING ; DENIES SYNCOPE  , DENIES CHEST PAIN   . Fibromyalgia   . GERD (gastroesophageal reflux disease)   . Hemorrhoids   . Hypertension   . Irritable bowel syndrome   . Long QT interval    a. mild - advised to avoid meds that may prolong QT.  Marland Kitchen  Myocardial infarction [facility]) 09/1991   does not know when second heart attack was  . Neuropathy   . Osteoporosis   . Paroxysmal A-fib (HCC)    PER PATIENT. I CAN FEEL MY HEART RACING WHEN IM IN IT   . Recurrent upper respiratory infection (URI)   . SVT (supraventricular tachycardia) (HCC)    a. nonsustained SVT - previously offered flecainide but refused;  b. 12/2012 30 day event monitor w/o significant arrhythmias.  . Thyroid nodule     Past Surgical History:  Procedure Laterality Date  . ABLATION OF DYSRHYTHMIC FOCUS  1993  . ANTERIOR AND POSTERIOR REPAIR N/A 08/01/2017   Procedure: ANTERIOR (CYSTOCELE) AND POSTERIOR REPAIR (RECTOCELE);  Surgeon: Bjorn Loser, MD;  [facility];  Service: Urology;  Laterality: N/A;  . APPENDECTOMY     1980  . BILATERAL SALPINGECTOMY Left 08/01/2017   Procedure: SALPINGECTOMY;  Surgeon: Bobbye Charleston, MD;  [facility];  Service: Gynecology;  Laterality: Left;  . BIOPSY THYROID    . CARDIAC ELECTROPHYSIOLOGY STUDY AND ABLATION     atrioventricular nodal reentant tachycardia  . CHOLECYSTECTOMY  1980  .  COLONOSCOPY  06/25/2008   Diverticulosis and Hemorrhoids  . COLPOSCOPY    . CORONARY ANGIOPLASTY  2007  . CYSTOSCOPY N/A 08/01/2017   Procedure: CYSTOSCOPY;  Surgeon: Bjorn Loser, MD;  [facility];  Service: Urology;  Laterality: N/A;  . EP IMPLANTABLE DEVICE N/A 03/03/2016   Procedure: Pacemaker Implant;  Surgeon: Evans Lance, MD;  Location: [facility];  Service: Cardiovascular;  Laterality: N/A;  . ESOPHAGOGASTRODUODENOSCOPY    . HYSTEROSCOPY  2002   with resection of endometrial polyps. by Dr.MCPhail  . HYSTEROSCOPY WITH D & C N/A 02/05/2015   Procedure: DILATATION AND CURETTAGE /HYSTEROSCOPY;  Surgeon: Vanessa Kick, MD;  Location: [facility];  Service: Gynecology;  Laterality: N/A;  . LOOP RECORDER IMPLANT  08/21/2013   MDT LinQ implanted by Dr Lovena Le for syncope  . LOOP RECORDER IMPLANT N/A 08/21/2013   Procedure:  LOOP RECORDER IMPLANT;  Surgeon: Evans Lance, MD;  Location: [facility] CATH LAB;  Service: Cardiovascular;  Laterality: N/A;  . ORIF RADIAL HEAD / NECK FRACTURE  09/05/88  . TILT TABLE STUDY N/A 05/15/2013   Procedure: TILT TABLE STUDY;  Surgeon: Deboraha Sprang, MD;  Location: [facility] CATH LAB;  Service: Cardiovascular;  Laterality: N/A;  . TOTAL HIP ARTHROPLASTY  10/10   Left, at [place]    . VAGINAL HYSTERECTOMY N/A 08/01/2017   Procedure: HYSTERECTOMY VAGINAL;  Surgeon: Bobbye Charleston, MD;  [facility];  Service: Gynecology;  Laterality: N/A;    There were no vitals filed for this visit.   Subjective Assessment - 11/04/20 0803    Subjective I am feeling about the same.  I had a lot of pain the day after the evaluation.    Diagnostic tests MRI - Multilevel disc and facet degeneration throughout the lumbar spine.  There is significant foraminal encroachment bilaterally at multiple  levels due to spurring. This is most severe C7-T1 with bilateral C8  nerve root compression    Currently in Pain? Yes    Pain Score 5     Pain Location Neck    Pain Orientation Left    Pain Descriptors / Indicators Aching    Pain Type Chronic pain    Pain Onset More than a month ago    Pain Frequency Intermittent    Aggravating Factors  life in general, end of the day    Pain Relieving Factors thermacare patches                             [facility] Adult PT Treatment/Exercise - 11/04/20 0001      Exercises   Exercises Neck      Neck Exercises: Standing   Neck Retraction 10 reps      Neck Exercises: Supine   Neck Retraction 5 reps    Other Supine Exercise rotation x5 each      Manual Therapy   Manual Therapy Soft tissue mobilization;Myofascial release    Manual therapy comments Lt upper trap, cervical paraspinals and suboccipitals            Trigger Point Dry Needling - 11/04/20 0001    Consent Given? Yes    Education Handout Provided  Previously provided    Muscles Treated Head and Neck Suboccipitals;Cervical multifidi;Upper trapezius    Dry Needling Comments Lt only    Upper Trapezius Response Twitch reponse elicited;Palpable increased muscle length    Suboccipitals Response Twitch response elicited;Palpable increased  muscle length    Cervical multifidi Response Twitch reponse elicited;Palpable increased muscle length                  PT Short Term Goals - 10/29/20 1710      PT SHORT TERM GOAL #1   Title Patient will be independent with HEP for continued progression at home.    Time 2    Period Weeks    Status New    Target Date 11/12/20      PT SHORT TERM GOAL #2   Title Patient will report no more than 4/10 pain at end of day to indicate improved activity tolerance.    Time 2    Period Weeks    Status New    Target Date 11/12/20      PT SHORT TERM GOAL #3   Title Patient will demonstrate 30 degrees cervical rotation bilaterally to indicate improved mobility for ADLs.    Baseline 20 degrees bilateral    Time 2    Period Weeks    Status New    Target Date 11/12/20             PT Long Term Goals - 10/29/20 1713      PT LONG TERM GOAL #1   Title Patient will be independent with advanced HEP for long term management of symptoms post D/C.    Time 4    Period Weeks    Status New    Target Date 11/26/20      PT LONG TERM GOAL #2   Title Patient will demonstrate 35 degrees of cervical flexion, extension, right rotation, and left rotation for improved mobility for ADLs.    Time 4    Period Weeks    Status New    Target Date 11/26/20      PT LONG TERM GOAL #3   Title Patient will appropriately demonstrate log roll into sidelying and then sitting in either direction without assistance for improved bed mobility with decreased cervical strain.    Baseline min assist for Rt sidelying to sit    Time 4    Period Weeks    Status New    Target Date 11/26/20                 Plan -  11/04/20 0847    Clinical Impression Statement Pt arrived and reported no change and reported that she has not been doing the exercises issued at evaluation.  Pt didn't feel the exercises would be helpful to her problem.  PT reviewed all HEP and explained the purpose of each exercise.  Pt tolerated these well.  PT provided tactile and verbal cues for alignment and technique.  Pt with tension and trigger points in Lt suboccipitals and cervical mutifidi and good response with twitch and reduced tension and improved mobility after manual therapy.  Pt was encouraged to stretch and work on upper trap relaxation while working.  Pt will continue to benefit from skilled PT to address Lt neck pain, postural strength and tissue mobility.    PT Frequency 2x / week    PT Duration 4 weeks    PT Treatment/Interventions ADLs/Self Care Home Management;Aquatic Therapy;Cryotherapy;Electrical Stimulation;Iontophoresis 4mg /ml Dexamethasone;Moist Heat;Traction;Functional mobility training;Therapeutic activities;Therapeutic exercise;Neuromuscular re-education;Patient/family education;Manual techniques;Passive range of motion;Dry needling;Taping;Spinal Manipulations;Joint Manipulations    PT Next Visit Plan assess response to DN, repeat if helpful, traction, FOTO    PT Home Exercise Plan [id number]    Consulted and Agree with Plan of  Care Patient           Patient will benefit from skilled therapeutic intervention in order to improve the following deficits and impairments:  Decreased activity tolerance,Decreased range of motion,Decreased strength,Hypomobility,Increased fascial restricitons,Increased muscle spasms,Impaired UE functional use,Postural dysfunction,Pain  Visit Diagnosis: Abnormal posture  Cervicalgia  Cramp and spasm  Muscle weakness (generalized)     Problem List Patient Active Problem List   Diagnosis Date Noted  . Cervical intraepithelial neoplasia grade 1 09/02/2020  . Menopausal syndrome  09/02/2020  . Overactive bladder 09/02/2020  . Postmenopausal bleeding 09/02/2020  . Left hip pain 09/02/2020  . Cellulitis 08/21/2020  . Recurrent UTI 07/21/2020  . Multiple thyroid nodules 01/14/2020  . Epigastric pain 12/10/2019  . Atypical chest pain 12/10/2019  . PVC's (premature ventricular contractions) 08/06/2019  . Hoarseness 07/16/2019  . Sinus node dysfunction ([place]) 03/11/2019  . Chronic cough 03/05/2019  . Urinary retention 03/05/2019  . Generalized weakness 09/11/2018  . Dysuria 05/09/2018  . Acute sinus infection 04/20/2018  . Other allergic rhinitis 04/20/2018  . Cystocele, midline 08/01/2017  . Hyperglycemia 01/31/2017  . Bilateral foot pain 01/31/2017  . Laryngopharyngeal reflux (LPR) 08/18/2016  . Globus pharyngeus 08/01/2016  . Thyroid nodule 08/01/2016  . Cough 06/24/2016  . Syncope 03/03/2016  . Pacemaker 03/03/2016  . Chest pain at rest 09/13/2015  . Spinal stenosis of lumbar region 09/07/2015  . Raynaud phenomenon 09/07/2015  . Insomnia 09/07/2015  . Preventative health care 12/18/2014  . Paroxysmal spells 01/28/2014  . Dysthymia 01/28/2014  . Injury of leg 10/07/2013  . Headache(784.0) 03/14/2013  . Eye problem 02/15/2013  . Persistent headaches 02/15/2013  . Low blood sugar 05/31/2012  . Pre-syncope 10/04/2011  . NSVT (nonsustained ventricular tachycardia) ([place]) 10/04/2011  . Dyspnea 10/01/2009  . Diverticulosis of large intestine 09/24/2009  . SWEATING 09/24/2009  . FATIQUE AND MALAISE 09/16/2009  . LONG QT SYNDROME 03/19/2009  . AV NODAL REENTRY TACHYCARDIA 10/03/2008  . Other dysphagia 06/17/2008  . HEMORRHOIDS 06/16/2008  . DUODENITIS, WITHOUT HEMORRHAGE 06/16/2008  . CONSTIPATION, CHRONIC 06/16/2008  . THYROID NODULE, right lobe. 05/26/2008  . Chronic pain syndrome 05/26/2008  . Coronary atherosclerosis 05/26/2008  . BRONCHITIS, RECURRENT 05/26/2008  . Irritable bowel syndrome 05/26/2008  . FECAL INCONTINENCE 05/26/2008  . Anxiety  state 03/24/2008  . Essential hypertension 03/24/2008  . GERD 03/24/2008  . DEGENERATIVE JOINT DISEASE 03/24/2008  . BACK PAIN, LUMBAR 03/24/2008  . Fibromyalgia 03/24/2008  . Chest pain 03/24/2008  . NEUROPATHY, HX OF 03/24/2008     Sigurd Sos, PT 11/04/20 8:49 AM  [facility] [address] [place], [place], [address] Phone: [phone]   Fax:  [phone]  Name: Renee [name] MRN: [id number] Date of Birth: Aug 30, 1945

## 2020-11-10 ENCOUNTER — Ambulatory Visit (INDEPENDENT_AMBULATORY_CARE_PROVIDER_SITE_OTHER): Payer: Medicare Other

## 2020-11-10 DIAGNOSIS — I5032 Chronic diastolic (congestive) heart failure: Secondary | ICD-10-CM | POA: Diagnosis not present

## 2020-11-10 LAB — CUP PACEART REMOTE DEVICE CHECK
Date Time Interrogation Session: 20220531074719
Implantable Lead Implant Date: 20170921
Implantable Lead Implant Date: 20170921
Implantable Lead Location: 753859
Implantable Lead Location: 753860
Implantable Lead Model: 377
Implantable Lead Model: 377
Implantable Lead Serial Number: 49575519
Implantable Lead Serial Number: 49580836
Implantable Pulse Generator Implant Date: 20170921
Pulse Gen Model: 407145
Pulse Gen Serial Number: 68850891

## 2020-11-11 ENCOUNTER — Ambulatory Visit: Payer: Medicare Other | Attending: Neurosurgery

## 2020-11-11 ENCOUNTER — Other Ambulatory Visit: Payer: Self-pay

## 2020-11-11 DIAGNOSIS — R252 Cramp and spasm: Secondary | ICD-10-CM | POA: Diagnosis not present

## 2020-11-11 DIAGNOSIS — M6281 Muscle weakness (generalized): Secondary | ICD-10-CM | POA: Insufficient documentation

## 2020-11-11 DIAGNOSIS — R293 Abnormal posture: Secondary | ICD-10-CM | POA: Insufficient documentation

## 2020-11-11 DIAGNOSIS — M542 Cervicalgia: Secondary | ICD-10-CM | POA: Diagnosis not present

## 2020-11-11 NOTE — Therapy (Signed)
[facility] [address] [place], [place], [address] Phone: [phone]   Fax:  [phone]  Physical Therapy Treatment  Patient Details  Name: Renee [name] MRN: [id number] Date of Birth: Jun 08, 1946 Referring Provider (PT): Ashok Pall, MD   Encounter Date: 11/11/2020   PT End of Session - 11/11/20 1702    Visit Number 3    Date for PT Re-Evaluation 11/27/20    Authorization Type UHC Medicare    Progress Note Due on Visit 10    PT Start Time 1618   dry needling   PT Stop Time 1657    PT Time Calculation (min) 39 min    Activity Tolerance Patient tolerated treatment well    Behavior During Therapy [facility] for tasks assessed/performed           Past Medical History:  Diagnosis Date  . Allergy    tape, electrodes  . Anxiety   . AV nodal re-entry tachycardia [facility])    a. s/p RFA 1993- Dr. Rolland Porter, [place]  . Broken neck ([place])    1993  . Cancer (HCC)    hx of cervical   ca  . CHF (congestive heart failure) ([place])   . Chronic chest pain    a. 2005 Cath: Mild nonobstructive plaque (30-40% LCX);  b. 08/2011 low risk myoview;  c. 09/2011 Coronary CT: NL cors w ca score of 0.  . Chronic pain syndrome   . Complication of anesthesia    blood pressure and heart dropped in 2009    . Concussion    1979, 1993  . Degenerative joint disease    a. s/p L Total Knee Arthroplasty.  . Diet-controlled type 2 diabetes mellitus ([place])    pt. denies  . Diverticulosis of colon   . Duodenitis without hemorrhage   . Dysrhythmia    REPORTS , SINCE PLACEMENT OF PACEMAKER, STILL CAN FEEL HEART " GOING IN AND OUT OF RYTHYN" REPORTS, YESTERDAY HER BP SYSTOLIC WAS IN THE 245Y AND WAS BREATHLESS, HR 112, SWEATING ; DENIES SYNCOPE  , DENIES CHEST PAIN   . Fibromyalgia   . GERD (gastroesophageal reflux disease)   . Hemorrhoids   . Hypertension   . Irritable bowel syndrome   . Long QT interval    a. mild - advised to avoid meds that may  prolong QT.  Marland Kitchen Myocardial infarction [facility]) 09/1991   does not know when second heart attack was  . Neuropathy   . Osteoporosis   . Paroxysmal A-fib (HCC)    PER PATIENT. I CAN FEEL MY HEART RACING WHEN IM IN IT   . Recurrent upper respiratory infection (URI)   . SVT (supraventricular tachycardia) (HCC)    a. nonsustained SVT - previously offered flecainide but refused;  b. 12/2012 30 day event monitor w/o significant arrhythmias.  . Thyroid nodule     Past Surgical History:  Procedure Laterality Date  . ABLATION OF DYSRHYTHMIC FOCUS  1993  . ANTERIOR AND POSTERIOR REPAIR N/A 08/01/2017   Procedure: ANTERIOR (CYSTOCELE) AND POSTERIOR REPAIR (RECTOCELE);  Surgeon: Bjorn Loser, MD;  [facility];  Service: Urology;  Laterality: N/A;  . APPENDECTOMY     1980  . BILATERAL SALPINGECTOMY Left 08/01/2017   Procedure: SALPINGECTOMY;  Surgeon: Bobbye Charleston, MD;  [facility];  Service: Gynecology;  Laterality: Left;  . BIOPSY THYROID    . CARDIAC ELECTROPHYSIOLOGY STUDY AND ABLATION     atrioventricular nodal reentant tachycardia  . CHOLECYSTECTOMY  1980  . COLONOSCOPY  06/25/2008   Diverticulosis and Hemorrhoids  . COLPOSCOPY    . CORONARY ANGIOPLASTY  2007  . CYSTOSCOPY N/A 08/01/2017   Procedure: CYSTOSCOPY;  Surgeon: Bjorn Loser, MD;  [facility];  Service: Urology;  Laterality: N/A;  . EP IMPLANTABLE DEVICE N/A 03/03/2016   Procedure: Pacemaker Implant;  Surgeon: Evans Lance, MD;  Location: [place] [facility];  Service: Cardiovascular;  Laterality: N/A;  . ESOPHAGOGASTRODUODENOSCOPY    . HYSTEROSCOPY  2002   with resection of endometrial polyps. by Dr.MCPhail  . HYSTEROSCOPY WITH D & C N/A 02/05/2015   Procedure: DILATATION AND CURETTAGE /HYSTEROSCOPY;  Surgeon: Vanessa Kick, MD;  Location: [facility];  Service: Gynecology;  Laterality: N/A;  . LOOP RECORDER IMPLANT  08/21/2013   MDT LinQ implanted by Dr Lovena Le for syncope  . LOOP RECORDER IMPLANT N/A  08/21/2013   Procedure: LOOP RECORDER IMPLANT;  Surgeon: Evans Lance, MD;  Location[facility] CATH LAB;  Service: Cardiovascular;  Laterality: N/A;  . ORIF RADIAL HEAD / NECK FRACTURE  09/05/88  . TILT TABLE STUDY N/A 05/15/2013   Procedure: TILT TABLE STUDY;  Surgeon: Deboraha Sprang, MD;  Location: [facility] CATH LAB;  Service: Cardiovascular;  Laterality: N/A;  . TOTAL HIP ARTHROPLASTY  10/10   Left, at [place]    . VAGINAL HYSTERECTOMY N/A 08/01/2017   Procedure: HYSTERECTOMY VAGINAL;  Surgeon: Bobbye Charleston, MD;  [facility];  Service: Gynecology;  Laterality: N/A;    There were no vitals filed for this visit.   Subjective Assessment - 11/11/20 1621    Subjective I had good relief after last session.  The head doesn't feel as bad.    Currently in Pain? Yes    Pain Score 5     Pain Location Neck    Pain Orientation Left    Pain Descriptors / Indicators Aching;Tightness    Pain Type Chronic pain    Pain Frequency Intermittent    Aggravating Factors  end of the day, just there sometimes    Pain Relieving Factors dry needling, stretching                             [facility] Adult PT Treatment/Exercise - 11/11/20 0001      Neck Exercises: Supine   Other Supine Exercise rotation x5 each      Manual Therapy   Manual Therapy Soft tissue mobilization;Myofascial release    Manual therapy comments Bil upper trap, cervical paraspinals and suboccipitals            Trigger Point Dry Needling - 11/11/20 0001    Consent Given? Yes    Education Handout Provided Previously provided    Muscles Treated Head and Neck Suboccipitals;Cervical multifidi;Upper trapezius    Dry Needling Comments Lt upper trap only, other bil    Upper Trapezius Response Twitch reponse elicited;Palpable increased muscle length    Suboccipitals Response Twitch response elicited;Palpable increased muscle length    Cervical multifidi Response Twitch reponse  elicited;Palpable increased muscle length                  PT Short Term Goals - 10/29/20 1710      PT SHORT TERM GOAL #1   Title Patient will be independent with HEP for continued progression at home.    Time 2    Period Weeks    Status New  Target Date 11/12/20      PT SHORT TERM GOAL #2   Title Patient will report no more than 4/10 pain at end of day to indicate improved activity tolerance.    Time 2    Period Weeks    Status New    Target Date 11/12/20      PT SHORT TERM GOAL #3   Title Patient will demonstrate 30 degrees cervical rotation bilaterally to indicate improved mobility for ADLs.    Baseline 20 degrees bilateral    Time 2    Period Weeks    Status New    Target Date 11/12/20             PT Long Term Goals - 10/29/20 1713      PT LONG TERM GOAL #1   Title Patient will be independent with advanced HEP for long term management of symptoms post D/C.    Time 4    Period Weeks    Status New    Target Date 11/26/20      PT LONG TERM GOAL #2   Title Patient will demonstrate 35 degrees of cervical flexion, extension, right rotation, and left rotation for improved mobility for ADLs.    Time 4    Period Weeks    Status New    Target Date 11/26/20      PT LONG TERM GOAL #3   Title Patient will appropriately demonstrate log roll into sidelying and then sitting in either direction without assistance for improved bed mobility with decreased cervical strain.    Baseline min assist for Rt sidelying to sit    Time 4    Period Weeks    Status New    Target Date 11/26/20                 Plan - 11/11/20 1701    Clinical Impression Statement Pt reported improved tissue mobility and reduced pain after dry needling last session.  Pt reports increased ease with supine cervical rotation.  Pt will come to PT tomorrow morning so this session focused on manual therapy to address tissue mobility.  Tomorrow, PT will address HEP, cervical mobility and  postural strength. Pt with improved tension in the Lt upper traps and suboccipitals  and demonstrated improved mobility after needling and manual therapy today.  Pt will continue to benefit from skilled PT to address pain, posture and cervical mobility.    PT Frequency 2x / week    PT Duration 4 weeks    PT Treatment/Interventions ADLs/Self Care Home Management;Aquatic Therapy;Cryotherapy;Electrical Stimulation;Iontophoresis 4mg /ml Dexamethasone;Moist Heat;Traction;Functional mobility training;Therapeutic activities;Therapeutic exercise;Neuromuscular re-education;Patient/family education;Manual techniques;Passive range of motion;Dry needling;Taping;Spinal Manipulations;Joint Manipulations    PT Next Visit Plan FOTO, add to HEP, cervical mobility    PT Home Exercise Plan [id number]    Consulted and Agree with Plan of Care Patient           Patient will benefit from skilled therapeutic intervention in order to improve the following deficits and impairments:  Decreased activity tolerance,Decreased range of motion,Decreased strength,Hypomobility,Increased fascial restricitons,Increased muscle spasms,Impaired UE functional use,Postural dysfunction,Pain  Visit Diagnosis: Abnormal posture  Cervicalgia  Cramp and spasm  Muscle weakness (generalized)     Problem List Patient Active Problem List   Diagnosis Date Noted  . Cervical intraepithelial neoplasia grade 1 09/02/2020  . Menopausal syndrome 09/02/2020  . Overactive bladder 09/02/2020  . Postmenopausal bleeding 09/02/2020  . Left hip pain 09/02/2020  . Cellulitis 08/21/2020  . Recurrent UTI 07/21/2020  .  Multiple thyroid nodules 01/14/2020  . Epigastric pain 12/10/2019  . Atypical chest pain 12/10/2019  . PVC's (premature ventricular contractions) 08/06/2019  . Hoarseness 07/16/2019  . Sinus node dysfunction ([place]) 03/11/2019  . Chronic cough 03/05/2019  . Urinary retention 03/05/2019  . Generalized weakness 09/11/2018  . Dysuria  05/09/2018  . Acute sinus infection 04/20/2018  . Other allergic rhinitis 04/20/2018  . Cystocele, midline 08/01/2017  . Hyperglycemia 01/31/2017  . Bilateral foot pain 01/31/2017  . Laryngopharyngeal reflux (LPR) 08/18/2016  . Globus pharyngeus 08/01/2016  . Thyroid nodule 08/01/2016  . Cough 06/24/2016  . Syncope 03/03/2016  . Pacemaker 03/03/2016  . Chest pain at rest 09/13/2015  . Spinal stenosis of lumbar region 09/07/2015  . Raynaud phenomenon 09/07/2015  . Insomnia 09/07/2015  . Preventative health care 12/18/2014  . Paroxysmal spells 01/28/2014  . Dysthymia 01/28/2014  . Injury of leg 10/07/2013  . Headache(784.0) 03/14/2013  . Eye problem 02/15/2013  . Persistent headaches 02/15/2013  . Low blood sugar 05/31/2012  . Pre-syncope 10/04/2011  . NSVT (nonsustained ventricular tachycardia) ([place]) 10/04/2011  . Dyspnea 10/01/2009  . Diverticulosis of large intestine 09/24/2009  . SWEATING 09/24/2009  . FATIQUE AND MALAISE 09/16/2009  . LONG QT SYNDROME 03/19/2009  . AV NODAL REENTRY TACHYCARDIA 10/03/2008  . Other dysphagia 06/17/2008  . HEMORRHOIDS 06/16/2008  . DUODENITIS, WITHOUT HEMORRHAGE 06/16/2008  . CONSTIPATION, CHRONIC 06/16/2008  . THYROID NODULE, right lobe. 05/26/2008  . Chronic pain syndrome 05/26/2008  . Coronary atherosclerosis 05/26/2008  . BRONCHITIS, RECURRENT 05/26/2008  . Irritable bowel syndrome 05/26/2008  . FECAL INCONTINENCE 05/26/2008  . Anxiety state 03/24/2008  . Essential hypertension 03/24/2008  . GERD 03/24/2008  . DEGENERATIVE JOINT DISEASE 03/24/2008  . BACK PAIN, LUMBAR 03/24/2008  . Fibromyalgia 03/24/2008  . Chest pain 03/24/2008  . NEUROPATHY, HX OF 03/24/2008     Sigurd Sos, PT 11/11/20 5:04 PM  [facility] [address], [place], [place], [address] Phone: [phone]   Fax:  [phone]  Name: Renee [name] MRN: [id number] Date of Birth:  08/06/1945

## 2020-11-12 ENCOUNTER — Ambulatory Visit: Payer: Medicare Other

## 2020-11-12 DIAGNOSIS — R252 Cramp and spasm: Secondary | ICD-10-CM | POA: Diagnosis not present

## 2020-11-12 DIAGNOSIS — M6281 Muscle weakness (generalized): Secondary | ICD-10-CM

## 2020-11-12 DIAGNOSIS — M542 Cervicalgia: Secondary | ICD-10-CM

## 2020-11-12 DIAGNOSIS — R293 Abnormal posture: Secondary | ICD-10-CM

## 2020-11-12 NOTE — Patient Instructions (Signed)
Access Code: [email or web address] URL: [email or web address] Date: 11/12/2020 Prepared by: Claiborne Billings  Exercises  Supine Shoulder Horizontal Abduction with Resistance - 1 x daily - 7 x weekly - 2 sets - 10 reps Supine Bilateral Shoulder External Rotation with Resistance - 1 x daily - 7 x weekly - 2 sets - 10 reps Supine PNF D2 Flexion with Resistance - 1 x daily - 7 x weekly - 2 sets - 10 reps

## 2020-11-12 NOTE — Therapy (Signed)
[facility] [address], [place], [place], [address] Phone: [phone]   Fax:  [phone]  Physical Therapy Treatment  Patient Details  Name: Renee [name] MRN: [id number] Date of Birth: 1946-03-18 Referring Provider (PT): Ashok Pall, MD   Encounter Date: 11/12/2020   PT End of Session - 11/12/20 1011    Visit Number 4    Date for PT Re-Evaluation 11/27/20    Authorization Type UHC Medicare    Progress Note Due on Visit 10    PT Start Time 0931    PT Stop Time 1012    PT Time Calculation (min) 41 min    Activity Tolerance Patient tolerated treatment well    Behavior During Therapy [facility] for tasks assessed/performed           Past Medical History:  Diagnosis Date  . Allergy    tape, electrodes  . Anxiety   . AV nodal re-entry tachycardia [facility])    a. s/p RFA 1993- Dr. Rolland Porter, [place]  . Broken neck ([place])    1993  . Cancer (HCC)    hx of cervical   ca  . CHF (congestive heart failure) ([place])   . Chronic chest pain    a. 2005 Cath: Mild nonobstructive plaque (30-40% LCX);  b. 08/2011 low risk myoview;  c. 09/2011 Coronary CT: NL cors w ca score of 0.  . Chronic pain syndrome   . Complication of anesthesia    blood pressure and heart dropped in 2009    . Concussion    1979, 1993  . Degenerative joint disease    a. s/p L Total Knee Arthroplasty.  . Diet-controlled type 2 diabetes mellitus ([place])    pt. denies  . Diverticulosis of colon   . Duodenitis without hemorrhage   . Dysrhythmia    REPORTS , SINCE PLACEMENT OF PACEMAKER, STILL CAN FEEL HEART " GOING IN AND OUT OF RYTHYN" REPORTS, YESTERDAY HER BP SYSTOLIC WAS IN THE 696E AND WAS BREATHLESS, HR 112, SWEATING ; DENIES SYNCOPE  , DENIES CHEST PAIN   . Fibromyalgia   . GERD (gastroesophageal reflux disease)   . Hemorrhoids   . Hypertension   . Irritable bowel syndrome   . Long QT interval    a. mild - advised to avoid meds that may prolong QT.  Marland Kitchen  Myocardial infarction [facility]) 09/1991   does not know when second heart attack was  . Neuropathy   . Osteoporosis   . Paroxysmal A-fib (HCC)    PER PATIENT. I CAN FEEL MY HEART RACING WHEN IM IN IT   . Recurrent upper respiratory infection (URI)   . SVT (supraventricular tachycardia) (HCC)    a. nonsustained SVT - previously offered flecainide but refused;  b. 12/2012 30 day event monitor w/o significant arrhythmias.  . Thyroid nodule     Past Surgical History:  Procedure Laterality Date  . ABLATION OF DYSRHYTHMIC FOCUS  1993  . ANTERIOR AND POSTERIOR REPAIR N/A 08/01/2017   Procedure: ANTERIOR (CYSTOCELE) AND POSTERIOR REPAIR (RECTOCELE);  Surgeon: Bjorn Loser, MD;  [facility];  Service: Urology;  Laterality: N/A;  . APPENDECTOMY     1980  . BILATERAL SALPINGECTOMY Left 08/01/2017   Procedure: SALPINGECTOMY;  Surgeon: Bobbye Charleston, MD;  [facility]: [facility];  Service: Gynecology;  Laterality: Left;  . BIOPSY THYROID    . CARDIAC ELECTROPHYSIOLOGY STUDY AND ABLATION     atrioventricular nodal reentant tachycardia  . CHOLECYSTECTOMY  1980  .  COLONOSCOPY  06/25/2008   Diverticulosis and Hemorrhoids  . COLPOSCOPY    . CORONARY ANGIOPLASTY  2007  . CYSTOSCOPY N/A 08/01/2017   Procedure: CYSTOSCOPY;  Surgeon: Bjorn Loser, MD;  [facility];  Service: Urology;  Laterality: N/A;  . EP IMPLANTABLE DEVICE N/A 03/03/2016   Procedure: Pacemaker Implant;  Surgeon: Evans Lance, MD;  Location: [place] CV LAB;  Service: Cardiovascular;  Laterality: N/A;  . ESOPHAGOGASTRODUODENOSCOPY    . HYSTEROSCOPY  2002   with resection of endometrial polyps. by Dr.MCPhail  . HYSTEROSCOPY WITH D & C N/A 02/05/2015   Procedure: DILATATION AND CURETTAGE /HYSTEROSCOPY;  Surgeon: Vanessa Kick, MD;  Location: [place] [facility];  Service: Gynecology;  Laterality: N/A;  . LOOP RECORDER IMPLANT  08/21/2013   MDT LinQ implanted by Dr Lovena Le for syncope  . LOOP RECORDER IMPLANT N/A 08/21/2013   Procedure:  LOOP RECORDER IMPLANT;  Surgeon: Evans Lance, MD;  Location: [facility] CATH LAB;  Service: Cardiovascular;  Laterality: N/A;  . ORIF RADIAL HEAD / NECK FRACTURE  09/05/88  . TILT TABLE STUDY N/A 05/15/2013   Procedure: TILT TABLE STUDY;  Surgeon: Deboraha Sprang, MD;  Location: [facility] CATH LAB;  Service: Cardiovascular;  Laterality: N/A;  . TOTAL HIP ARTHROPLASTY  10/10   Left, at [place]    . VAGINAL HYSTERECTOMY N/A 08/01/2017   Procedure: HYSTERECTOMY VAGINAL;  Surgeon: Bobbye Charleston, MD;  [facility];  Service: Gynecology;  Laterality: N/A;    There were no vitals filed for this visit.   Subjective Assessment - 11/12/20 0935    Subjective I had a crazy morning at the office.    Currently in Pain? Yes    Pain Score 4     Pain Location Neck    Pain Orientation Left    Pain Descriptors / Indicators Aching              [facility] PT Assessment - 11/12/20 0001      AROM   Cervical - Right Rotation 40    Cervical - Left Rotation 40                         [facility] Adult PT Treatment/Exercise - 11/12/20 0001      Neck Exercises: Supine   Upper Extremity D2 Flexion;10 reps;Theraband    Theraband Level (UE D2) Level 1 (Yellow)    Other Supine Exercise rotation x5 each    Other Supine Exercise horizontal abduction, ER with yellow band 2x10      Manual Therapy   Manual Therapy Soft tissue mobilization;Myofascial release    Manual therapy comments Bil upper trap, cervical paraspinals and suboccipitals    Soft tissue mobilization suboccipital release, passive stretch into sidebending and rotation to pt tolerance                  PT Education - 11/12/20 0946    Education Details Access Code: [email or web address]    Person(s) Educated Patient    Methods Explanation;Demonstration;Handout    Comprehension Verbalized understanding;Returned demonstration            PT Short Term Goals - 11/12/20 0937      PT SHORT TERM GOAL #1    Title Patient will be independent with HEP for continued progression at home.    Status Achieved      PT SHORT TERM GOAL #3   Title Patient will demonstrate 30 degrees cervical rotation  bilaterally to indicate improved mobility for ADLs.    Status Achieved             PT Long Term Goals - 10/29/20 1713      PT LONG TERM GOAL #1   Title Patient will be independent with advanced HEP for long term management of symptoms post D/C.    Time 4    Period Weeks    Status New    Target Date 11/26/20      PT LONG TERM GOAL #2   Title Patient will demonstrate 35 degrees of cervical flexion, extension, right rotation, and left rotation for improved mobility for ADLs.    Time 4    Period Weeks    Status New    Target Date 11/26/20      PT LONG TERM GOAL #3   Title Patient will appropriately demonstrate log roll into sidelying and then sitting in either direction without assistance for improved bed mobility with decreased cervical strain.    Baseline min assist for Rt sidelying to sit    Time 4    Period Weeks    Status New    Target Date 11/26/20                 Plan - 11/12/20 5462    Clinical Impression Statement Pt is responding well to dry needling with reduced tension and pain in the Lt side of her head and neck.  Cervical A/ROM is improved to 40 degrees bilaterally, a 20 degree improvement in each direction.  PT added supine postural strength exercises to HEP today.  Pt required tactile cues for technique and verbal cues for relaxation of scapular musculature.  Pt with tension and reduced segmental mobility in the cervical spine and responded well to manual therapy today.  Pt will continue to benefit from skilled PT to address neck pain and tension, postural strength and cervical flexibility.    PT Frequency 2x / week    PT Duration 4 weeks    PT Treatment/Interventions ADLs/Self Care Home Management;Aquatic Therapy;Cryotherapy;Electrical Stimulation;Iontophoresis 4mg /ml  Dexamethasone;Moist Heat;Traction;Functional mobility training;Therapeutic activities;Therapeutic exercise;Neuromuscular re-education;Patient/family education;Manual techniques;Passive range of motion;Dry needling;Taping;Spinal Manipulations;Joint Manipulations    PT Next Visit Plan review HEP issued today, DN to neck and upper traps, cervical mobility    PT Home Exercise Plan [email or web address]    Consulted and Agree with Plan of Care Patient           Patient will benefit from skilled therapeutic intervention in order to improve the following deficits and impairments:  Decreased activity tolerance,Decreased range of motion,Decreased strength,Hypomobility,Increased fascial restricitons,Increased muscle spasms,Impaired UE functional use,Postural dysfunction,Pain  Visit Diagnosis: Abnormal posture  Cervicalgia  Cramp and spasm  Muscle weakness (generalized)     Problem List Patient Active Problem List   Diagnosis Date Noted  . Cervical intraepithelial neoplasia grade 1 09/02/2020  . Menopausal syndrome 09/02/2020  . Overactive bladder 09/02/2020  . Postmenopausal bleeding 09/02/2020  . Left hip pain 09/02/2020  . Cellulitis 08/21/2020  . Recurrent UTI 07/21/2020  . Multiple thyroid nodules 01/14/2020  . Epigastric pain 12/10/2019  . Atypical chest pain 12/10/2019  . PVC's (premature ventricular contractions) 08/06/2019  . Hoarseness 07/16/2019  . Sinus node dysfunction ([place]) 03/11/2019  . Chronic cough 03/05/2019  . Urinary retention 03/05/2019  . Generalized weakness 09/11/2018  . Dysuria 05/09/2018  . Acute sinus infection 04/20/2018  . Other allergic rhinitis 04/20/2018  . Cystocele, midline 08/01/2017  . Hyperglycemia 01/31/2017  . Bilateral foot pain  01/31/2017  . Laryngopharyngeal reflux (LPR) 08/18/2016  . Globus pharyngeus 08/01/2016  . Thyroid nodule 08/01/2016  . Cough 06/24/2016  . Syncope 03/03/2016  . Pacemaker 03/03/2016  . Chest pain at rest 09/13/2015  .  Spinal stenosis of lumbar region 09/07/2015  . Raynaud phenomenon 09/07/2015  . Insomnia 09/07/2015  . Preventative health care 12/18/2014  . Paroxysmal spells 01/28/2014  . Dysthymia 01/28/2014  . Injury of leg 10/07/2013  . Headache(784.0) 03/14/2013  . Eye problem 02/15/2013  . Persistent headaches 02/15/2013  . Low blood sugar 05/31/2012  . Pre-syncope 10/04/2011  . NSVT (nonsustained ventricular tachycardia) ([place]) 10/04/2011  . Dyspnea 10/01/2009  . Diverticulosis of large intestine 09/24/2009  . SWEATING 09/24/2009  . FATIQUE AND MALAISE 09/16/2009  . LONG QT SYNDROME 03/19/2009  . AV NODAL REENTRY TACHYCARDIA 10/03/2008  . Other dysphagia 06/17/2008  . HEMORRHOIDS 06/16/2008  . DUODENITIS, WITHOUT HEMORRHAGE 06/16/2008  . CONSTIPATION, CHRONIC 06/16/2008  . THYROID NODULE, right lobe. 05/26/2008  . Chronic pain syndrome 05/26/2008  . Coronary atherosclerosis 05/26/2008  . BRONCHITIS, RECURRENT 05/26/2008  . Irritable bowel syndrome 05/26/2008  . FECAL INCONTINENCE 05/26/2008  . Anxiety state 03/24/2008  . Essential hypertension 03/24/2008  . GERD 03/24/2008  . DEGENERATIVE JOINT DISEASE 03/24/2008  . BACK PAIN, LUMBAR 03/24/2008  . Fibromyalgia 03/24/2008  . Chest pain 03/24/2008  . NEUROPATHY, HX OF 03/24/2008    Sigurd Sos, PT 11/12/20 10:13 AM  [facility] [address] [place], [place], [address] Phone: [phone]   Fax:  [phone]  Name: Renee [name] MRN: [id number] Date of Birth: 1946/03/11

## 2020-11-17 ENCOUNTER — Other Ambulatory Visit: Payer: Self-pay

## 2020-11-17 ENCOUNTER — Ambulatory Visit: Payer: Medicare Other

## 2020-11-17 DIAGNOSIS — R252 Cramp and spasm: Secondary | ICD-10-CM

## 2020-11-17 DIAGNOSIS — M542 Cervicalgia: Secondary | ICD-10-CM

## 2020-11-17 DIAGNOSIS — R293 Abnormal posture: Secondary | ICD-10-CM

## 2020-11-17 DIAGNOSIS — M6281 Muscle weakness (generalized): Secondary | ICD-10-CM

## 2020-11-17 NOTE — Therapy (Addendum)
[facility] [address], [place], [place], [address] Phone: [phone]   Fax:  [phone]  Physical Therapy Treatment  Patient Details  Name: Renee [name] MRN: [id number] Date of Birth: May 18, 1946 Referring Provider (PT): Ashok Pall, MD   Encounter Date: 11/17/2020   PT End of Session - 11/17/20 1052    Visit Number 5    Date for PT Re-Evaluation 11/27/20    Authorization Type UHC Medicare    Progress Note Due on Visit 10    PT Start Time 1018    PT Stop Time 1055   dry needling   PT Time Calculation (min) 37 min    Activity Tolerance Patient tolerated treatment well    Behavior During Therapy [facility] for tasks assessed/performed           Past Medical History:  Diagnosis Date  . Allergy    tape, electrodes  . Anxiety   . AV nodal re-entry tachycardia [facility])    a. s/p RFA 1993- Dr. Rolland Porter, [place]  . Broken neck ([place])    1993  . Cancer (HCC)    hx of cervical   ca  . CHF (congestive heart failure) ([place])   . Chronic chest pain    a. 2005 Cath: Mild nonobstructive plaque (30-40% LCX);  b. 08/2011 low risk myoview;  c. 09/2011 Coronary CT: NL cors w ca score of 0.  . Chronic pain syndrome   . Complication of anesthesia    blood pressure and heart dropped in 2009    . Concussion    1979, 1993  . Degenerative joint disease    a. s/p L Total Knee Arthroplasty.  . Diet-controlled type 2 diabetes mellitus ([place])    pt. denies  . Diverticulosis of colon   . Duodenitis without hemorrhage   . Dysrhythmia    REPORTS , SINCE PLACEMENT OF PACEMAKER, STILL CAN FEEL HEART " GOING IN AND OUT OF RYTHYN" REPORTS, YESTERDAY HER BP SYSTOLIC WAS IN THE 324M AND WAS BREATHLESS, HR 112, SWEATING ; DENIES SYNCOPE  , DENIES CHEST PAIN   . Fibromyalgia   . GERD (gastroesophageal reflux disease)   . Hemorrhoids   . Hypertension   . Irritable bowel syndrome   . Long QT interval    a. mild - advised to avoid meds that may  prolong QT.  Marland Kitchen Myocardial infarction [facility]) 09/1991   does not know when second heart attack was  . Neuropathy   . Osteoporosis   . Paroxysmal A-fib (HCC)    PER PATIENT. I CAN FEEL MY HEART RACING WHEN IM IN IT   . Recurrent upper respiratory infection (URI)   . SVT (supraventricular tachycardia) (HCC)    a. nonsustained SVT - previously offered flecainide but refused;  b. 12/2012 30 day event monitor w/o significant arrhythmias.  . Thyroid nodule     Past Surgical History:  Procedure Laterality Date  . ABLATION OF DYSRHYTHMIC FOCUS  1993  . ANTERIOR AND POSTERIOR REPAIR N/A 08/01/2017   Procedure: ANTERIOR (CYSTOCELE) AND POSTERIOR REPAIR (RECTOCELE);  Surgeon: Bjorn Loser, MD;  [facility];  Service: Urology;  Laterality: N/A;  . APPENDECTOMY     1980  . BILATERAL SALPINGECTOMY Left 08/01/2017   Procedure: SALPINGECTOMY;  Surgeon: Bobbye Charleston, MD;  [facility];  Service: Gynecology;  Laterality: Left;  . BIOPSY THYROID    . CARDIAC ELECTROPHYSIOLOGY STUDY AND ABLATION     atrioventricular nodal reentant tachycardia  . CHOLECYSTECTOMY  1980  . COLONOSCOPY  06/25/2008   Diverticulosis and Hemorrhoids  . COLPOSCOPY    . CORONARY ANGIOPLASTY  2007  . CYSTOSCOPY N/A 08/01/2017   Procedure: CYSTOSCOPY;  Surgeon: Bjorn Loser, MD;  [facility];  Service: Urology;  Laterality: N/A;  . EP IMPLANTABLE DEVICE N/A 03/03/2016   Procedure: Pacemaker Implant;  Surgeon: Evans Lance, MD;  Location: [place] [facility];  Service: Cardiovascular;  Laterality: N/A;  . ESOPHAGOGASTRODUODENOSCOPY    . HYSTEROSCOPY  2002   with resection of endometrial polyps. by Dr.MCPhail  . HYSTEROSCOPY WITH D & C N/A 02/05/2015   Procedure: DILATATION AND CURETTAGE /HYSTEROSCOPY;  Surgeon: Vanessa Kick, MD;  Location: [facility];  Service: Gynecology;  Laterality: N/A;  . LOOP RECORDER IMPLANT  08/21/2013   MDT LinQ implanted by Dr Lovena Le for syncope  . LOOP RECORDER IMPLANT N/A  08/21/2013   Procedure: LOOP RECORDER IMPLANT;  Surgeon: Evans Lance, MD;  Location: [facility] CATH LAB;  Service: Cardiovascular;  Laterality: N/A;  . ORIF RADIAL HEAD / NECK FRACTURE  09/05/88  . TILT TABLE STUDY N/A 05/15/2013   Procedure: TILT TABLE STUDY;  Surgeon: Deboraha Sprang, MD;  Location: [facility] CATH LAB;  Service: Cardiovascular;  Laterality: N/A;  . TOTAL HIP ARTHROPLASTY  10/10   Left, at [place]    . VAGINAL HYSTERECTOMY N/A 08/01/2017   Procedure: HYSTERECTOMY VAGINAL;  Surgeon: Bobbye Charleston, MD;  [facility];  Service: Gynecology;  Laterality: N/A;    There were no vitals filed for this visit.   Subjective Assessment - 11/17/20 1021    Subjective I had a lot of pain after last session.  My left side of my head was very painful. I had to take Tylenol.    Diagnostic tests MRI - Multilevel disc and facet degeneration throughout the lumbar spine.  There is significant foraminal encroachment bilaterally at multiple  levels due to spurring. This is most severe C7-T1 with bilateral C8  nerve root compression    Currently in Pain? Yes    Pain Score 5     Pain Location Neck    Pain Orientation Right    Pain Descriptors / Indicators Aching    Pain Type Chronic pain    Pain Onset More than a month ago    Pain Frequency Intermittent    Aggravating Factors  end of the da    Pain Relieving Factors stretching, dry needling                             [facility] Adult PT Treatment/Exercise - 11/17/20 0001      Manual Therapy   Manual Therapy Soft tissue mobilization;Myofascial release    Manual therapy comments Bil upper trap, cervical paraspinals and suboccipitals            Trigger Point Dry Needling - 11/17/20 0001    Consent Given? Yes    Education Handout Provided Previously provided    Muscles Treated Head and Neck Suboccipitals;Cervical multifidi;Upper trapezius    Dry Needling Comments Lt upper trap only,  other bil    Upper Trapezius Response Twitch reponse elicited;Palpable increased muscle length    Suboccipitals Response Twitch response elicited;Palpable increased muscle length    Cervical multifidi Response Twitch reponse elicited;Palpable increased muscle length                  PT Short Term Goals -  11/17/20 1024      PT SHORT TERM GOAL #1   Title Patient will be independent with HEP for continued progression at home.    Status On-going      PT SHORT TERM GOAL #2   Title Patient will report no more than 4/10 pain at end of day to indicate improved activity tolerance.    Status On-going             PT Long Term Goals - 10/29/20 1713      PT LONG TERM GOAL #1   Title Patient will be independent with advanced HEP for long term management of symptoms post D/C.    Time 4    Period Weeks    Status New    Target Date 11/26/20      PT LONG TERM GOAL #2   Title Patient will demonstrate 35 degrees of cervical flexion, extension, right rotation, and left rotation for improved mobility for ADLs.    Time 4    Period Weeks    Status New    Target Date 11/26/20      PT LONG TERM GOAL #3   Title Patient will appropriately demonstrate log roll into sidelying and then sitting in either direction without assistance for improved bed mobility with decreased cervical strain.    Baseline min assist for Rt sidelying to sit    Time 4    Period Weeks    Status New    Target Date 11/26/20                 Plan - 11/17/20 1103    Clinical Impression Statement Pt reports that she had a flare-up of Lt sided neck and head pain over the weekend.  Pt has not done any of her new supine theraband exercises issued last session as thought this was the reason for her Lt sided head and neck pain.  Pt was not able to determine when this pain began regarding proximity to last visit.  PT discussed how to manage a flare-up including gentle flexibility exercises, heat and reducing number of  reps of exercise during this time.  Pt did demonstrate improved cervical A/ROM last session.  Today, pt with bil tension and trigger points in bil traps and Lt>Rt suboccipitals.  This is overall improved since the start of care.  Pt with good twitch response to dry needling in Lt suboccipitals and bil upper traps.  Additional exercise not complete today as pt was concerned regarding flare-up of pain over the weekend.  Pt will continue 1x/wk and additional DN will only be performed if positive result after today's session.  If no change in overall function after visit 6, consider referral back to MD for further evaluation.    PT Treatment/Interventions ADLs/Self Care Home Management;Aquatic Therapy;Cryotherapy;Electrical Stimulation;Iontophoresis 4mg /ml Dexamethasone;Moist Heat;Traction;Functional mobility training;Therapeutic activities;Therapeutic exercise;Neuromuscular re-education;Patient/family education;Manual techniques;Passive range of motion;Dry needling;Taping;Spinal Manipulations;Joint Manipulations    PT Next Visit Plan supine theraband HEP review-reduce to 5 reps each.  Manual therapy.  Discuss decompression strategies.  ERO needed next session.    PT Home Exercise Plan [email or web address]    Consulted and Agree with Plan of Care Patient           Patient will benefit from skilled therapeutic intervention in order to improve the following deficits and impairments:  Decreased activity tolerance,Decreased range of motion,Decreased strength,Hypomobility,Increased fascial restricitons,Increased muscle spasms,Impaired UE functional use,Postural dysfunction,Pain  Visit Diagnosis: Abnormal posture  Cervicalgia  Cramp and spasm  Muscle weakness (  generalized)     Problem List Patient Active Problem List   Diagnosis Date Noted  . Cervical intraepithelial neoplasia grade 1 09/02/2020  . Menopausal syndrome 09/02/2020  . Overactive bladder 09/02/2020  . Postmenopausal bleeding 09/02/2020  . Left  hip pain 09/02/2020  . Cellulitis 08/21/2020  . Recurrent UTI 07/21/2020  . Multiple thyroid nodules 01/14/2020  . Epigastric pain 12/10/2019  . Atypical chest pain 12/10/2019  . PVC's (premature ventricular contractions) 08/06/2019  . Hoarseness 07/16/2019  . Sinus node dysfunction ([place]) 03/11/2019  . Chronic cough 03/05/2019  . Urinary retention 03/05/2019  . Generalized weakness 09/11/2018  . Dysuria 05/09/2018  . Acute sinus infection 04/20/2018  . Other allergic rhinitis 04/20/2018  . Cystocele, midline 08/01/2017  . Hyperglycemia 01/31/2017  . Bilateral foot pain 01/31/2017  . Laryngopharyngeal reflux (LPR) 08/18/2016  . Globus pharyngeus 08/01/2016  . Thyroid nodule 08/01/2016  . Cough 06/24/2016  . Syncope 03/03/2016  . Pacemaker 03/03/2016  . Chest pain at rest 09/13/2015  . Spinal stenosis of lumbar region 09/07/2015  . Raynaud phenomenon 09/07/2015  . Insomnia 09/07/2015  . Preventative health care 12/18/2014  . Paroxysmal spells 01/28/2014  . Dysthymia 01/28/2014  . Injury of leg 10/07/2013  . Headache(784.0) 03/14/2013  . Eye problem 02/15/2013  . Persistent headaches 02/15/2013  . Low blood sugar 05/31/2012  . Pre-syncope 10/04/2011  . NSVT (nonsustained ventricular tachycardia) ([place]) 10/04/2011  . Dyspnea 10/01/2009  . Diverticulosis of large intestine 09/24/2009  . SWEATING 09/24/2009  . FATIQUE AND MALAISE 09/16/2009  . LONG QT SYNDROME 03/19/2009  . AV NODAL REENTRY TACHYCARDIA 10/03/2008  . Other dysphagia 06/17/2008  . HEMORRHOIDS 06/16/2008  . DUODENITIS, WITHOUT HEMORRHAGE 06/16/2008  . CONSTIPATION, CHRONIC 06/16/2008  . THYROID NODULE, right lobe. 05/26/2008  . Chronic pain syndrome 05/26/2008  . Coronary atherosclerosis 05/26/2008  . BRONCHITIS, RECURRENT 05/26/2008  . Irritable bowel syndrome 05/26/2008  . FECAL INCONTINENCE 05/26/2008  . Anxiety state 03/24/2008  . Essential hypertension 03/24/2008  . GERD 03/24/2008  . DEGENERATIVE  JOINT DISEASE 03/24/2008  . BACK PAIN, LUMBAR 03/24/2008  . Fibromyalgia 03/24/2008  . Chest pain 03/24/2008  . NEUROPATHY, HX OF 03/24/2008     Sigurd Sos, PT 11/17/20 11:08 AM  [facility] [address] [place], [place], [address] Phone: [phone]   Fax:  [phone]  Name: Renee [name] MRN: [id number] Date of Birth: [birth year]

## 2020-11-20 DIAGNOSIS — R051 Acute cough: Secondary | ICD-10-CM | POA: Diagnosis not present

## 2020-11-20 DIAGNOSIS — Z20822 Contact with and (suspected) exposure to covid-19: Secondary | ICD-10-CM | POA: Diagnosis not present

## 2020-11-24 ENCOUNTER — Ambulatory Visit: Payer: Medicare Other | Admitting: Physical Therapy

## 2020-11-24 ENCOUNTER — Other Ambulatory Visit: Payer: Self-pay

## 2020-11-24 DIAGNOSIS — M6281 Muscle weakness (generalized): Secondary | ICD-10-CM | POA: Diagnosis not present

## 2020-11-24 DIAGNOSIS — M542 Cervicalgia: Secondary | ICD-10-CM | POA: Diagnosis not present

## 2020-11-24 DIAGNOSIS — R252 Cramp and spasm: Secondary | ICD-10-CM | POA: Diagnosis not present

## 2020-11-24 DIAGNOSIS — R293 Abnormal posture: Secondary | ICD-10-CM | POA: Diagnosis not present

## 2020-11-24 NOTE — Patient Instructions (Signed)
Seated SCM stretch: 2 x 20s

## 2020-11-24 NOTE — Therapy (Signed)
[facility] [address] [place], [place], [address] Phone: [phone]   Fax:  [phone]  Physical Therapy Treatment  Patient Details  Name: Renee [name] MRN: [id number] Date of Birth: Dec 27, 1945 Referring Provider (PT): Ashok Pall, MD   Encounter Date: 11/24/2020  Progress Note Reporting Period 10/29/2020 to 11/24/2020  See note below for Objective Data and Assessment of Progress/Goals.       PT End of Session - 11/24/20 1719     Visit Number 6    Date for PT Re-Evaluation 01/08/21    Authorization Type [employer] Medicare    Progress Note Due on Visit 10    PT Start Time 1616    PT Stop Time 1659    PT Time Calculation (min) 43 min    Activity Tolerance Patient tolerated treatment well;No increased pain    Behavior During Therapy WFL for tasks assessed/performed             Past Medical History:  Diagnosis Date   Allergy    tape, electrodes   Anxiety    AV nodal re-entry tachycardia ([place])    a. s/p RFA 1993- Dr. Rolland Porter, [facility]   Broken neck [facility])    1993   Cancer [facility])    hx of cervical   ca   CHF (congestive heart failure) ([place])    Chronic chest pain    a. 2005 Cath: Mild nonobstructive plaque (30-40% LCX);  b. 08/2011 low risk myoview;  c. 09/2011 Coronary CT: NL cors w ca score of 0.   Chronic pain syndrome    Complication of anesthesia    blood pressure and heart dropped in 2009     Concussion    1979, 1993   Degenerative joint disease    a. s/p L Total Knee Arthroplasty.   Diet-controlled type 2 diabetes mellitus ([place])    pt. denies   Diverticulosis of colon    Duodenitis without hemorrhage    Dysrhythmia    REPORTS , SINCE PLACEMENT OF PACEMAKER, STILL CAN FEEL HEART " GOING IN AND OUT OF [facility]" REPORTS, YESTERDAY HER BP SYSTOLIC WAS IN THE 536I AND WAS BREATHLESS, HR 112, SWEATING ; DENIES SYNCOPE  , DENIES CHEST PAIN    Fibromyalgia    GERD (gastroesophageal reflux disease)     Hemorrhoids    Hypertension    Irritable bowel syndrome    Long QT interval    a. mild - advised to avoid meds that may prolong QT.   Myocardial infarction [facility]) 09/1991   does not know when second heart attack was   Neuropathy    Osteoporosis    Paroxysmal A-fib (HCC)    PER PATIENT. I CAN FEEL MY HEART RACING WHEN IM IN IT    Recurrent upper respiratory infection (URI)    SVT (supraventricular tachycardia) (HCC)    a. nonsustained SVT - previously offered flecainide but refused;  b. 12/2012 30 day event monitor w/o significant arrhythmias.   Thyroid nodule     Past Surgical History:  Procedure Laterality Date   ABLATION OF DYSRHYTHMIC FOCUS  1993   ANTERIOR AND POSTERIOR REPAIR N/A 08/01/2017   Procedure: ANTERIOR (CYSTOCELE) AND POSTERIOR REPAIR (RECTOCELE);  Surgeon: Bjorn Loser, MD;  Location: [facility];  Service: Urology;  Laterality: N/A;   APPENDECTOMY     1980   BILATERAL SALPINGECTOMY Left 08/01/2017   Procedure: SALPINGECTOMY;  Surgeon: Bobbye Charleston, MD;  Location: [facility];  Service: Gynecology;  Laterality: Left;  BIOPSY THYROID     CARDIAC ELECTROPHYSIOLOGY STUDY AND ABLATION     atrioventricular nodal reentant tachycardia   CHOLECYSTECTOMY  1980   COLONOSCOPY  06/25/2008   Diverticulosis and Hemorrhoids   COLPOSCOPY     CORONARY ANGIOPLASTY  2007   CYSTOSCOPY N/A 08/01/2017   Procedure: CYSTOSCOPY;  Surgeon: Bjorn Loser, MD;  Location: [facility];  Service: Urology;  Laterality: N/A;   EP IMPLANTABLE DEVICE N/A 03/03/2016   Procedure: Pacemaker Implant;  Surgeon: Evans Lance, MD;  Location: [facility];  Service: Cardiovascular;  Laterality: N/A;   ESOPHAGOGASTRODUODENOSCOPY     HYSTEROSCOPY  2002   with resection of endometrial polyps. by Dr.MCPhail   HYSTEROSCOPY WITH D & C N/A 02/05/2015   Procedure: DILATATION AND CURETTAGE /HYSTEROSCOPY;  Surgeon: Vanessa Kick, MD;  Location: [facility];  Service: Gynecology;  Laterality: N/A;   LOOP RECORDER  IMPLANT  08/21/2013   MDT LinQ implanted by Dr Lovena Le for syncope   LOOP RECORDER IMPLANT N/A 08/21/2013   Procedure: LOOP RECORDER IMPLANT;  Surgeon: Evans Lance, MD;  Location: [facility] C[facility] LAB;  Service: Cardiovascular;  Laterality: N/A;   ORIF RADIAL HEAD / NECK FRACTURE  09/05/88   TILT TABLE STUDY N/A 05/15/2013   Procedure: TILT TABLE STUDY;  Surgeon: Deboraha Sprang, MD;  Location: [facility] CATH LAB;  Service: Cardiovascular;  Laterality: N/A;   TOTAL HIP ARTHROPLASTY  10/10   Left, at [place] N/A 08/01/2017   Procedure: HYSTERECTOMY VAGINAL;  Surgeon: Bobbye Charleston, MD;  Location: [facility];  Service: Gynecology;  Laterality: N/A;    There were no vitals filed for this visit.   Subjective Assessment - 11/24/20 1620     Subjective Patient reports that Rt rotation is improving but Lt side has not which makes it difficult for her to clear traffic. Continues to have pain at the Lt side of her head that radiates superiorly from occiput.    Pertinent History previous head injury per patient    Diagnostic tests MRI - Multilevel disc and facet degeneration throughout the lumbar spine.  There is significant foraminal encroachment bilaterally at multiple  levels due to spurring. This is most severe C7-T1 with bilateral C8  nerve root compression    Currently in Pain? Yes    Pain Score 5     Pain Location Neck    Pain Orientation Left    Pain Descriptors / Indicators Aching;Tightness    Pain Onset More than a month ago                [facility] PT Assessment - 11/24/20 0001       Assessment   Medical Diagnosis M47.22 (ICD-10-CM) - Other spondylosis with radiculopathy, cervical region    Referring Provider (PT) Ashok Pall, MD    Hand Dominance Left    Next MD Visit 12/21/2020    Prior Therapy Yes      Precautions   Precautions ICD/Pacemaker      Restrictions   Weight Bearing Restrictions No      Balance Screen   Has  the patient fallen in the past 6 months No      Cognition   Overall Cognitive Status Within Functional Limits for tasks assessed      Observation/Other Assessments   Focus on Therapeutic Outcomes (FOTO)  46 (goal 52)      Posture/Postural Control   Postural Limitations Rounded Shoulders;Forward head  AROM   Cervical Flexion 50    Cervical Extension 30    Cervical - Right Rotation 40    Cervical - Left Rotation 45      Bed Mobility   Bed Mobility Left Sidelying to Sit    Right Sidelying to Sit Independent                           [facility] PT Treatment/Exercise - 11/24/20 0001       Neck Exercises: Seated   Cervical Rotation Limitations AAROM rotation with towel; Lt/Rt x20s      Neck Exercises: Supine   Neck Retraction 5 reps;3 secs    Neck Retraction Limitations into ball    Capital Flexion 10 reps    Cervical Rotation Right;Left;10 reps   with ball push     Manual Therapy   Manual Therapy Soft tissue mobilization    Soft tissue mobilization in sidelying; Lt upper trap, Lt levator scap; Lt suboccipitals; Lt SCM release; skilled palpation and assessment of tissues prior to, during, and before dry needling      Neck Exercises: Stretches   Other Neck Stretches SCM stretch seated, 2 x 20s Lt only; supine SCM stretch, 2 x 20s L only              Trigger Point Dry Needling - 11/24/20 0001     Consent Given? Yes    Education Handout Provided Previously provided    Muscles Treated Head and Neck Sternocleidomastoid;Suboccipitals    Sternocleidomastoid Response Twitch response elicited   Lt only   Suboccipitals Response Palpable increased muscle length                  PT Education - 11/24/20 1713     Education Details Seated SCM stretch    Person(s) Educated Patient    Methods Explanation;Demonstration;Tactile cues;Verbal cues    Comprehension Verbalized understanding;Returned demonstration;Verbal cues required;Tactile cues required               PT Short Term Goals - 11/24/20 1718       PT SHORT TERM GOAL #1   Title Patient will be independent with HEP for continued progression at home.    Time 2    Period Weeks    Status On-going   Reports partial compliance   Target Date 11/12/20      PT SHORT TERM GOAL #2   Title Patient will report no more than 4/10 pain at end of day to indicate improved activity tolerance.    Time 2    Period Weeks    Status Not Met   5/10   Target Date 11/12/20      PT SHORT TERM GOAL #3   Title Patient will demonstrate 30 degrees cervical rotation bilaterally to indicate improved mobility for ADLs.    Baseline 20 degrees bilateral    Time 2    Period Weeks    Status Achieved   45 Lt rotation; 40 Rt rotation   Target Date 11/12/20               PT Long Term Goals - 11/24/20 1718       PT LONG TERM GOAL #1   Title Patient will be independent with advanced HEP for long term management of symptoms post D/C.    Time 4    Period Weeks    Status On-going      PT LONG TERM GOAL #  2   Title Patient will demonstrate 35 degrees of cervical flexion, extension, right rotation, and left rotation for improved mobility for ADLs.    Time 4    Period Weeks    Status Partially Met   flexion 50; ext 30; Lt rotation 45; Rt rotation 40     PT LONG TERM GOAL #3   Title Patient will appropriately demonstrate log roll into sidelying and then sitting in either direction without assistance for improved bed mobility with decreased cervical strain.    Baseline min assist for Rt sidelying to sit    Time 4    Period Weeks    Status On-going   independent from Lt side                  Plan - 11/24/20 1713     Clinical Impression Statement Patient is a 75 y/o female referred due to cervical radiculopathy. PMH includes HTN, pacemaker, chronic headache, and neuropathy. Patient demonstrates significantly improved cervical flexion and extension AROM. She continues to have increased  difficulty isolating cervical sidebending from shoulder elevatation making this difficult to truly assess. Rt cervical rotation not improved but patient reports decreased pain with AROM attempts. Patient continues to report significant discomfort when performing Lt cervical rotation. Bed mobility improved as patient able to transition Lt sidelying to sit without assistance as compared to initial visit where she requiring min assist. Patient reporting partial resolution of Lt sided neck pain with introduction of SCM stretch. Would benefit from continued skilled intervention for decreased pain and improved functional use of bil UE.    Personal Factors and Comorbidities Comorbidity 3+    Comorbidities HTN, pacemaker, neuropathy, chronic headache    Examination-Activity Limitations Bed Mobility;Reach Overhead    Examination-Participation Restrictions Occupation    Rehab Potential Fair    PT Frequency 2x / week    PT Duration 6 weeks    PT Treatment/Interventions ADLs/Self Care Home Management;Aquatic Therapy;Cryotherapy;Electrical Stimulation;Iontophoresis 38m/ml Dexamethasone;Moist Heat;Traction;Functional mobility training;Therapeutic activities;Therapeutic exercise;Neuromuscular re-education;Patient/family education;Manual techniques;Passive range of motion;Dry needling;Taping;Spinal Manipulations;Joint Manipulations    PT Next Visit Plan manual therapy as needed; continue cervical mobility and decompression strategies; modalities as needed    PT Home Exercise Plan [id number]    Consulted and Agree with Plan of Care Patient             Patient will benefit from skilled therapeutic intervention in order to improve the following deficits and impairments:  Decreased activity tolerance, Decreased range of motion, Decreased strength, Hypomobility, Increased fascial restricitons, Increased muscle spasms, Impaired UE functional use, Postural dysfunction, Pain  Visit Diagnosis: Abnormal posture - Plan: PT  plan of care cert/re-cert  Cervicalgia - Plan: PT plan of care cert/re-cert  Cramp and spasm - Plan: PT plan of care cert/re-cert  Muscle weakness (generalized) - Plan: PT plan of care cert/re-cert     Problem List Patient Active Problem List   Diagnosis Date Noted   Cervical intraepithelial neoplasia grade 1 09/02/2020   Menopausal syndrome 09/02/2020   Overactive bladder 09/02/2020   Postmenopausal bleeding 09/02/2020   Left hip pain 09/02/2020   Cellulitis 08/21/2020   Recurrent UTI 07/21/2020   Multiple thyroid nodules 01/14/2020   Epigastric pain 12/10/2019   Atypical chest pain 12/10/2019   PVC's (premature ventricular contractions) 08/06/2019   Hoarseness 07/16/2019   [place] 03/11/2019   Chronic cough 03/05/2019   Urinary retention 03/05/2019   Generalized weakness 09/11/2018   Dysuria 05/09/2018   Acute sinus infection 04/20/2018  Other allergic rhinitis 04/20/2018   Cystocele, midline 08/01/2017   Hyperglycemia 01/31/2017   Bilateral foot pain 01/31/2017   Laryngopharyngeal reflux (LPR) 08/18/2016   Globus pharyngeus 08/01/2016   Thyroid nodule 08/01/2016   Cough 06/24/2016   Syncope 03/03/2016   Pacemaker 03/03/2016   Chest pain at rest 09/13/2015   Spinal stenosis of lumbar region 09/07/2015   Raynaud phenomenon 09/07/2015   Insomnia 09/07/2015   Preventative health care 12/18/2014   Paroxysmal spells 01/28/2014   Dysthymia 01/28/2014   Injury of leg 10/07/2013   Headache(784.0) 03/14/2013   Eye problem 02/15/2013   Persistent headaches 02/15/2013   Low blood sugar 05/31/2012   Pre-syncope 10/04/2011   NSVT (nonsustained ventricular tachycardia) ([place]) 10/04/2011   Dyspnea 10/01/2009   Diverticulosis of large intestine 09/24/2009   SWEATING 09/24/2009   FATIQUE AND MALAISE 09/16/2009   LONG QT SYNDROME 03/19/2009   AV NODAL REENTRY TACHYCARDIA 10/03/2008   Other dysphagia 06/17/2008   HEMORRHOIDS 06/16/2008   DUODENITIS,  WITHOUT HEMORRHAGE 06/16/2008   CONSTIPATION, CHRONIC 06/16/2008   THYROID NODULE, right lobe. 05/26/2008   Chronic pain syndrome 05/26/2008   Coronary atherosclerosis 05/26/2008   BRONCHITIS, RECURRENT 05/26/2008   Irritable bowel syndrome 05/26/2008   FECAL INCONTINENCE 05/26/2008   Anxiety state 03/24/2008   Essential hypertension 03/24/2008   GERD 03/24/2008   DEGENERATIVE JOINT DISEASE 03/24/2008   BACK PAIN, LUMBAR 03/24/2008   Fibromyalgia 03/24/2008   Chest pain 03/24/2008   NEUROPATHY, HX OF 03/24/2008   Everardo All PT, DPT  11/24/20 5:30 PM    [facility] [address] [place], [place], [address] Phone: [phone]   Fax:  [phone]  Name: Renee [name] MRN: [id number] Date of Birth: [birth year]

## 2020-12-01 NOTE — Progress Notes (Signed)
Remote pacemaker transmission.   

## 2020-12-07 ENCOUNTER — Ambulatory Visit: Payer: Medicare Other

## 2020-12-07 ENCOUNTER — Other Ambulatory Visit: Payer: Self-pay

## 2020-12-07 DIAGNOSIS — R252 Cramp and spasm: Secondary | ICD-10-CM | POA: Diagnosis not present

## 2020-12-07 DIAGNOSIS — M542 Cervicalgia: Secondary | ICD-10-CM | POA: Diagnosis not present

## 2020-12-07 DIAGNOSIS — M6281 Muscle weakness (generalized): Secondary | ICD-10-CM | POA: Diagnosis not present

## 2020-12-07 DIAGNOSIS — R293 Abnormal posture: Secondary | ICD-10-CM | POA: Diagnosis not present

## 2020-12-07 NOTE — Therapy (Addendum)
[facility] [address] [place], [place], [address] Phone: [phone]   Fax:  [phone]  Physical Therapy Treatment  Patient Details  Name: Renee [name] MRN: [id number] Date of Birth: November 25, 1945 Referring Provider (PT): Ashok Pall, MD   Encounter Date: 12/07/2020   PT End of Session - 12/07/20 1642     Visit Number 7    Date for PT Re-Evaluation 01/08/21    Authorization Type [employer] Medicare    Progress Note Due on Visit 16    PT Start Time 1617    PT Stop Time 1652    PT Time Calculation (min) 35 min    Activity Tolerance Patient tolerated treatment well;No increased pain    Behavior During Therapy WFL for tasks assessed/performed             Past Medical History:  Diagnosis Date   Allergy    tape, electrodes   Anxiety    AV nodal re-entry tachycardia ([place])    a. s/p RFA 1993- Dr. Rolland Porter, [facility]   Broken neck [facility])    1993   Cancer [facility])    hx of cervical   ca   CHF (congestive heart failure) ([place])    Chronic chest pain    a. 2005 Cath: Mild nonobstructive plaque (30-40% LCX);  b. 08/2011 low risk myoview;  c. 09/2011 Coronary CT: NL cors w ca score of 0.   Chronic pain syndrome    Complication of anesthesia    blood pressure and heart dropped in 2009     Concussion    1979, 1993   Degenerative joint disease    a. s/p L Total Knee Arthroplasty.   Diet-controlled type 2 diabetes mellitus ([place])    pt. denies   Diverticulosis of colon    Duodenitis without hemorrhage    Dysrhythmia    REPORTS , SINCE PLACEMENT OF PACEMAKER, STILL CAN FEEL HEART " GOING IN AND OUT OF RYTHYN" REPORTS, YESTERDAY HER BP SYSTOLIC WAS IN THE 250N AND WAS BREATHLESS, HR 112, SWEATING ; DENIES SYNCOPE  , DENIES CHEST PAIN    Fibromyalgia    GERD (gastroesophageal reflux disease)    Hemorrhoids    Hypertension    Irritable bowel syndrome    Long QT interval    a. mild - advised to avoid meds that may prolong QT.    Myocardial infarction [facility]) 09/1991   does not know when second heart attack was   Neuropathy    Osteoporosis    Paroxysmal A-fib (HCC)    PER PATIENT. I CAN FEEL MY HEART RACING WHEN IM IN IT    Recurrent upper respiratory infection (URI)    SVT (supraventricular tachycardia) (HCC)    a. nonsustained SVT - previously offered flecainide but refused;  b. 12/2012 30 day event monitor w/o significant arrhythmias.   Thyroid nodule     Past Surgical History:  Procedure Laterality Date   ABLATION OF DYSRHYTHMIC FOCUS  1993   ANTERIOR AND POSTERIOR REPAIR N/A 08/01/2017   Procedure: ANTERIOR (CYSTOCELE) AND POSTERIOR REPAIR (RECTOCELE);  Surgeon: Bjorn Loser, MD;  Location: [facility];  Service: Urology;  Laterality: N/A;   APPENDECTOMY     1980   BILATERAL SALPINGECTOMY Left 08/01/2017   Procedure: SALPINGECTOMY;  Surgeon: Bobbye Charleston, MD;  Location: [facility];  Service: Gynecology;  Laterality: Left;   BIOPSY THYROID     CARDIAC ELECTROPHYSIOLOGY STUDY AND ABLATION     atrioventricular nodal reentant tachycardia  CHOLECYSTECTOMY  1980   COLONOSCOPY  06/25/2008   Diverticulosis and Hemorrhoids   COLPOSCOPY     CORONARY ANGIOPLASTY  2007   CYSTOSCOPY N/A 08/01/2017   Procedure: CYSTOSCOPY;  Surgeon: Bjorn Loser, MD;  [facility]: [facility];  Service: Urology;  Laterality: N/A;   EP IMPLANTABLE DEVICE N/A 03/03/2016   Procedure: Pacemaker Implant;  Surgeon: Evans Lance, MD;  Location: [facility];  Service: Cardiovascular;  Laterality: N/A;   ESOPHAGOGASTRODUODENOSCOPY     HYSTEROSCOPY  2002   with resection of endometrial polyps. by Dr.MCPhail   HYSTEROSCOPY WITH D & C N/A 02/05/2015   Procedure: DILATATION AND CURETTAGE /HYSTEROSCOPY;  Surgeon: Vanessa Kick, MD;  Location: [facility];  Service: Gynecology;  Laterality: N/A;   LOOP RECORDER IMPLANT  08/21/2013   MDT LinQ implanted by Dr Lovena Le for syncope   LOOP RECORDER IMPLANT N/A 08/21/2013   Procedure: LOOP RECORDER IMPLANT;   Surgeon: Evans Lance, MD;  Location: [facility] CATH LAB;  Service: Cardiovascular;  Laterality: N/A;   ORIF RADIAL HEAD / NECK FRACTURE  09/05/88   TILT TABLE STUDY N/A 05/15/2013   Procedure: TILT TABLE STUDY;  Surgeon: Deboraha Sprang, MD;  Location: [facility] LAB;  Service: Cardiovascular;  Laterality: N/A;   TOTAL HIP ARTHROPLASTY  10/10   Left, at [place] N/A 08/01/2017   Procedure: HYSTERECTOMY VAGINAL;  Surgeon: Bobbye Charleston, MD;  Location: [facility];  Service: Gynecology;  Laterality: N/A;    There were no vitals filed for this visit.   Subjective Assessment - 12/07/20 1621     Subjective I have had back pain all day.  The left side of my neck is still very tight and I have pain on the back of my head.  No change with PT with pain at the back of my head on the Lt.    Pertinent History previous head injury per patient    Diagnostic tests MRI - Multilevel disc and facet degeneration throughout the lumbar spine.  There is significant foraminal encroachment bilaterally at multiple  levels due to spurring. This is most severe C7-T1 with bilateral C8  nerve root compression    Currently in Pain? Yes    Pain Location Neck    Pain Orientation Left    Pain Descriptors / Indicators Aching;Tightness    Pain Type Chronic pain    Pain Onset More than a month ago    Pain Frequency Intermittent    Aggravating Factors  end of the day, turning head, constant    Pain Relieving Factors stretching, dry needling, Tylenol                               [facility] Adult PT Treatment/Exercise - 12/07/20 0001       Modalities   Modalities Traction      Traction   Type of Traction Cervical    Min (lbs) 5    Max (lbs) 15    Hold Time 60    Rest Time 10    Time 15                    PT Education - 12/07/20 1640     Education Details time spent discussing pain, appropriate HEP including stretching and  cervical retractions.  Pt declined doing theraband exercises due to Rt shoulder pain.    Person(s)  Educated Patient    Methods Explanation    Comprehension Verbalized understanding              PT Short Term Goals - 12/07/20 1624       PT SHORT TERM GOAL #1   Title Patient will be independent with HEP for continued progression at home.    Status On-going               PT Long Term Goals - 12/07/20 1626       PT LONG TERM GOAL #1   Title Patient will be independent with advanced HEP for long term management of symptoms post D/C.    Status On-going      PT LONG TERM GOAL #2   Title Patient will demonstrate 35 degrees of cervical flexion, extension, right rotation, and left rotation for improved mobility for ADLs.      PT LONG TERM GOAL #3   Title Patient will appropriately demonstrate log roll into sidelying and then sitting in either direction without assistance for improved bed mobility with decreased cervical strain.    Baseline independent from Lt side    Status On-going                   Plan - 12/07/20 1639     Clinical Impression Statement Pt reports significant pain and tightness in the Lt side of the neck that is unchanged since the start of care.  Pt reports some reduced tension on that side but no significant change in symptoms of pain.  Pt with main compliant that she is limited in Lt cervical rotation particularly with driving and has an intense pain in the Lt head and neck.   Bed mobility improved as patient able to transition Lt sidelying to sit without assistance as compared to initial visit where she requiring min assist. Trial of cervical traction today as pt reports that she benefitted from this years ago and this may help to create space to improve functional A/ROM.  Pt will be placed on hold per her request until she sees Dr Christella Noa to discuss limited progress regarding pain and A/ROM.    PT Frequency 2x / week    PT Duration 6 weeks    PT  Next Visit Plan hold until pt sees MD.  D/C if pt does not return by 01/08/21    PT Home Exercise Plan [id number]    Consulted and Agree with Plan of Care Patient             Patient will benefit from skilled therapeutic intervention in order to improve the following deficits and impairments:  Decreased activity tolerance, Decreased range of motion, Decreased strength, Hypomobility, Increased fascial restricitons, Increased muscle spasms, Impaired UE functional use, Postural dysfunction, Pain  Visit Diagnosis: Abnormal posture  Cervicalgia  Cramp and spasm  Muscle weakness (generalized)     Problem List Patient Active Problem List   Diagnosis Date Noted   Cervical intraepithelial neoplasia grade 1 09/02/2020   Menopausal syndrome 09/02/2020   Overactive bladder 09/02/2020   Postmenopausal bleeding 09/02/2020   Left hip pain 09/02/2020   Cellulitis 08/21/2020   Recurrent UTI 07/21/2020   Multiple thyroid nodules 01/14/2020   Epigastric pain 12/10/2019   Atypical chest pain 12/10/2019   PVC's (premature ventricular contractions) 08/06/2019   Hoarseness 07/16/2019   Sinus node dysfunction (HCC) 03/11/2019   Chronic cough 03/05/2019   Urinary retention 03/05/2019   Generalized weakness 09/11/2018   Dysuria 05/09/2018  Acute sinus infection 04/20/2018   Other allergic rhinitis 04/20/2018   Cystocele, midline 08/01/2017   Hyperglycemia 01/31/2017   Bilateral foot pain 01/31/2017   Laryngopharyngeal reflux (LPR) 08/18/2016   Globus pharyngeus 08/01/2016   Thyroid nodule 08/01/2016   Cough 06/24/2016   Syncope 03/03/2016   Pacemaker 03/03/2016   Chest pain at rest 09/13/2015   Spinal stenosis of lumbar region 09/07/2015   Raynaud phenomenon 09/07/2015   Insomnia 09/07/2015   Preventative health care 12/18/2014   Paroxysmal spells 01/28/2014   Dysthymia 01/28/2014   Injury of leg 10/07/2013   Headache(784.0) 03/14/2013   Eye problem 02/15/2013   Persistent  headaches 02/15/2013   Low blood sugar 05/31/2012   Pre-syncope 10/04/2011   NSVT (nonsustained ventricular tachycardia) ([place]) 10/04/2011   Dyspnea 10/01/2009   Diverticulosis of large intestine 09/24/2009   SWEATING 09/24/2009   FATIQUE AND MALAISE 09/16/2009   LONG QT SYNDROME 03/19/2009   AV NODAL REENTRY TACHYCARDIA 10/03/2008   Other dysphagia 06/17/2008   HEMORRHOIDS 06/16/2008   DUODENITIS, WITHOUT HEMORRHAGE 06/16/2008   CONSTIPATION, CHRONIC 06/16/2008   THYROID NODULE, right lobe. 05/26/2008   Chronic pain syndrome 05/26/2008   Coronary atherosclerosis 05/26/2008   BRONCHITIS, RECURRENT 05/26/2008   Irritable bowel syndrome 05/26/2008   FECAL INCONTINENCE 05/26/2008   Anxiety state 03/24/2008   Essential hypertension 03/24/2008   GERD 03/24/2008   DEGENERATIVE JOINT DISEASE 03/24/2008   BACK PAIN, LUMBAR 03/24/2008   Fibromyalgia 03/24/2008   Chest pain 03/24/2008   NEUROPATHY, HX OF 03/24/2008    Sigurd Sos, PT 12/07/20 4:44 PM  PHYSICAL THERAPY DISCHARGE SUMMARY  Visits from Start of Care: 7  Current functional level related to goals / functional outcomes: See above for most recent PT status.  Pt didn't return to PT.     Remaining deficits: See above for current deficits.     Education / Equipment: HEP, posture/body mechanics    Patient agrees to discharge. Patient goals were not met. Patient is being discharged due to not returning since the last visit. Sigurd Sos, PT 01/13/21 8:17 AM   [facility] [address] [place], [place], [address] Phone: [phone]   Fax:  [phone]  Name: Renee [name] MRN: [id number] Date of Birth: [birth year]

## 2020-12-15 ENCOUNTER — Encounter (HOSPITAL_COMMUNITY): Payer: Self-pay

## 2020-12-16 ENCOUNTER — Other Ambulatory Visit: Payer: Self-pay

## 2020-12-16 ENCOUNTER — Ambulatory Visit (HOSPITAL_COMMUNITY)
Admission: RE | Admit: 2020-12-16 | Discharge: 2020-12-16 | Disposition: A | Discharge status: Home / Self Care | Payer: Medicare Other | Source: Ambulatory Visit | Attending: Cardiology | Admitting: Cardiology

## 2020-12-16 DIAGNOSIS — I10 Essential (primary) hypertension: Secondary | ICD-10-CM | POA: Diagnosis not present

## 2020-12-16 DIAGNOSIS — R531 Weakness: Secondary | ICD-10-CM | POA: Insufficient documentation

## 2020-12-16 LAB — CBC
HCT: 40.3 % (ref 36.0–46.0)
Hemoglobin: 12.8 g/dL (ref 12.0–15.0)
MCH: 26.1 pg (ref 26.0–34.0)
MCHC: 31.8 g/dL (ref 30.0–36.0)
MCV: 82.2 fL (ref 80.0–100.0)
Platelets: 255 10*3/uL (ref 150–400)
RBC: 4.9 MIL/uL (ref 3.87–5.11)
RDW: 13.4 % (ref 11.5–15.5)
WBC: 5.1 10*3/uL (ref 4.0–10.5)
nRBC: 0 % (ref 0.0–0.2)

## 2020-12-16 LAB — COMPREHENSIVE METABOLIC PANEL
ALT: 21 U/L (ref 0–44)
AST: 19 U/L (ref 15–41)
Albumin: 4.1 g/dL (ref 3.5–5.0)
Alkaline Phosphatase: 125 U/L (ref 38–126)
Anion gap: 9 (ref 5–15)
BUN: 7 mg/dL — ABNORMAL LOW (ref 8–23)
CO2: 26 mmol/L (ref 22–32)
Calcium: 9.8 mg/dL (ref 8.9–10.3)
Chloride: 105 mmol/L (ref 98–111)
Creatinine, Ser: 0.74 mg/dL (ref 0.44–1.00)
GFR, Estimated: >60 mL/min (ref >60)
Glucose, Bld: 88 mg/dL (ref 70–99)
Potassium: 4 mmol/L (ref 3.5–5.1)
Sodium: 140 mmol/L (ref 135–145)
Total Bilirubin: 1.1 mg/dL (ref 0.3–1.2)
Total Protein: 6.7 g/dL (ref 6.5–8.1)

## 2020-12-16 LAB — TSH: TSH: 0.965 u[IU]/mL (ref 0.350–4.500)

## 2020-12-16 LAB — BRAIN NATRIURETIC PEPTIDE: B Natriuretic Peptide: 68.8 pg/mL (ref 0.0–100.0)

## 2020-12-17 ENCOUNTER — Encounter (HOSPITAL_COMMUNITY): Payer: Self-pay

## 2020-12-22 ENCOUNTER — Other Ambulatory Visit: Payer: Self-pay | Admitting: Internal Medicine

## 2020-12-28 DIAGNOSIS — R030 Elevated blood-pressure reading, without diagnosis of hypertension: Secondary | ICD-10-CM | POA: Diagnosis not present

## 2020-12-28 DIAGNOSIS — M4722 Other spondylosis with radiculopathy, cervical region: Secondary | ICD-10-CM | POA: Diagnosis not present

## 2021-01-08 ENCOUNTER — Encounter (HOSPITAL_COMMUNITY): Payer: Self-pay

## 2021-01-14 DIAGNOSIS — M542 Cervicalgia: Secondary | ICD-10-CM | POA: Diagnosis not present

## 2021-01-14 DIAGNOSIS — M47812 Spondylosis without myelopathy or radiculopathy, cervical region: Secondary | ICD-10-CM | POA: Diagnosis not present

## 2021-01-19 ENCOUNTER — Ambulatory Visit
Admission: RE | Admit: 2021-01-19 | Discharge: 2021-01-19 | Disposition: A | Discharge status: Home / Self Care | Payer: Medicare Other | Source: Ambulatory Visit | Attending: Internal Medicine | Admitting: Internal Medicine

## 2021-01-19 ENCOUNTER — Other Ambulatory Visit: Payer: Self-pay

## 2021-01-19 DIAGNOSIS — N644 Mastodynia: Secondary | ICD-10-CM | POA: Diagnosis not present

## 2021-01-19 DIAGNOSIS — R922 Inconclusive mammogram: Secondary | ICD-10-CM | POA: Diagnosis not present

## 2021-01-28 ENCOUNTER — Other Ambulatory Visit: Payer: Self-pay | Admitting: Gastroenterology

## 2021-01-28 ENCOUNTER — Other Ambulatory Visit (HOSPITAL_COMMUNITY): Payer: Self-pay | Admitting: Cardiology

## 2021-02-06 ENCOUNTER — Encounter (HOSPITAL_COMMUNITY): Payer: Self-pay

## 2021-02-08 ENCOUNTER — Encounter (HOSPITAL_COMMUNITY): Payer: Self-pay

## 2021-02-08 ENCOUNTER — Ambulatory Visit (INDEPENDENT_AMBULATORY_CARE_PROVIDER_SITE_OTHER): Payer: Medicare Other

## 2021-02-08 ENCOUNTER — Other Ambulatory Visit (HOSPITAL_COMMUNITY): Payer: Self-pay | Admitting: *Deleted

## 2021-02-08 DIAGNOSIS — R0600 Dyspnea, unspecified: Secondary | ICD-10-CM

## 2021-02-08 DIAGNOSIS — I495 Sick sinus syndrome: Secondary | ICD-10-CM

## 2021-02-08 DIAGNOSIS — R0609 Other forms of dyspnea: Secondary | ICD-10-CM

## 2021-02-08 DIAGNOSIS — M47812 Spondylosis without myelopathy or radiculopathy, cervical region: Secondary | ICD-10-CM | POA: Diagnosis not present

## 2021-02-08 LAB — CUP PACEART REMOTE DEVICE CHECK
Date Time Interrogation Session: 20220829083913
Implantable Lead Implant Date: 20170921
Implantable Lead Implant Date: 20170921
Implantable Lead Location: 753859
Implantable Lead Location: 753860
Implantable Lead Model: 377
Implantable Lead Model: 377
Implantable Lead Serial Number: 49575519
Implantable Lead Serial Number: 49580836
Implantable Pulse Generator Implant Date: 20170921
Pulse Gen Model: 407145
Pulse Gen Serial Number: 68850891

## 2021-02-10 ENCOUNTER — Other Ambulatory Visit: Payer: Self-pay

## 2021-02-10 ENCOUNTER — Telehealth: Payer: Self-pay | Admitting: Internal Medicine

## 2021-02-10 ENCOUNTER — Other Ambulatory Visit (HOSPITAL_COMMUNITY): Payer: Self-pay | Admitting: Cardiology

## 2021-02-10 DIAGNOSIS — Z950 Presence of cardiac pacemaker: Secondary | ICD-10-CM

## 2021-02-10 MED ORDER — FLECAINIDE ACETATE 50 MG PO TABS: 50.0000 mg | ORAL_TABLET | Freq: Two times a day (BID) | ORAL | 0 refills | Status: DC

## 2021-02-10 MED ORDER — FLECAINIDE ACETATE 50 MG PO TABS: 50.0000 mg | ORAL_TABLET | Freq: Two times a day (BID) | ORAL | 2 refills | Status: DC

## 2021-02-10 NOTE — Telephone Encounter (Signed)
Called pt to inform her that we do not have samples of flecainide, but I could send a short supply to local pharmacy, until mail order arrives. Pt verbalized that she would like that. I sent a new Rx for flecainide to [facility] mail order pharmacy and a week supply to local pharmacy, [facility] in [place]. Confirmation received from both. I advised the pt that if she has any other problems, questions or concerns, please give our office a call back. Pt verbalized understanding.

## 2021-02-10 NOTE — Telephone Encounter (Signed)
Pt's medication Flecainide was sent to [facility] mail order pharmacy and a week supply to local pharmacy [facility] in [place]. Confirmation received for both.

## 2021-02-10 NOTE — Telephone Encounter (Signed)
Patient calling the office for samples of medication:   1.  What medication and dosage are you requesting samples for? flecainide (TAMBOCOR) 50 MG tablet  2.  Are you currently out of this medication? No, has enough for tomorrow and the next day. She states she needs about a weeks worth, because her prescription will be coming in the mail some time between 9/5-9/8.

## 2021-02-12 ENCOUNTER — Telehealth: Payer: Self-pay | Admitting: Internal Medicine

## 2021-02-12 NOTE — Telephone Encounter (Signed)
Patient states she is returning a call received this morning at 7:10 AM. However, she is unsure who called or what it may have been regarding.

## 2021-02-17 ENCOUNTER — Encounter (HOSPITAL_COMMUNITY): Payer: Self-pay

## 2021-02-18 NOTE — Progress Notes (Signed)
Remote pacemaker transmission.   

## 2021-02-19 ENCOUNTER — Telehealth (HOSPITAL_COMMUNITY): Payer: Self-pay | Admitting: Vascular Surgery

## 2021-02-19 ENCOUNTER — Encounter (HOSPITAL_COMMUNITY): Payer: Self-pay

## 2021-02-19 ENCOUNTER — Telehealth (HOSPITAL_COMMUNITY): Payer: Self-pay | Admitting: *Deleted

## 2021-02-19 NOTE — Telephone Encounter (Signed)
Lvm giving pt PYP appt , asked pt to call back to confirm

## 2021-02-19 NOTE — Progress Notes (Unsigned)
Patient taking Flecainide 50 mg .bid

## 2021-02-23 DIAGNOSIS — G4486 Cervicogenic headache: Secondary | ICD-10-CM | POA: Diagnosis not present

## 2021-02-23 DIAGNOSIS — M47812 Spondylosis without myelopathy or radiculopathy, cervical region: Secondary | ICD-10-CM | POA: Diagnosis not present

## 2021-02-23 DIAGNOSIS — M542 Cervicalgia: Secondary | ICD-10-CM | POA: Diagnosis not present

## 2021-02-23 DIAGNOSIS — I10 Essential (primary) hypertension: Secondary | ICD-10-CM | POA: Diagnosis not present

## 2021-02-25 ENCOUNTER — Encounter (HOSPITAL_COMMUNITY): Payer: Medicare Other

## 2021-02-25 ENCOUNTER — Ambulatory Visit (HOSPITAL_COMMUNITY): Admission: RE | Admit: 2021-02-25 | Payer: Medicare Other | Source: Ambulatory Visit

## 2021-03-01 ENCOUNTER — Telehealth (HOSPITAL_COMMUNITY): Payer: Self-pay | Admitting: Vascular Surgery

## 2021-03-01 ENCOUNTER — Telehealth (HOSPITAL_COMMUNITY): Payer: Self-pay | Admitting: Internal Medicine

## 2021-03-01 ENCOUNTER — Other Ambulatory Visit: Payer: Self-pay

## 2021-03-01 ENCOUNTER — Ambulatory Visit (HOSPITAL_COMMUNITY): Payer: Medicare Other | Attending: Internal Medicine

## 2021-03-01 DIAGNOSIS — R0609 Other forms of dyspnea: Secondary | ICD-10-CM | POA: Diagnosis not present

## 2021-03-01 DIAGNOSIS — R0600 Dyspnea, unspecified: Secondary | ICD-10-CM | POA: Insufficient documentation

## 2021-03-01 NOTE — Telephone Encounter (Signed)
Pt request return call regarding a major test please advise

## 2021-03-01 NOTE — Telephone Encounter (Signed)
Returned pt .. lvm to call back

## 2021-03-04 ENCOUNTER — Telehealth: Payer: Self-pay

## 2021-03-04 ENCOUNTER — Other Ambulatory Visit (HOSPITAL_COMMUNITY): Payer: Self-pay | Admitting: *Deleted

## 2021-03-04 DIAGNOSIS — I5022 Chronic systolic (congestive) heart failure: Secondary | ICD-10-CM

## 2021-03-04 DIAGNOSIS — I5032 Chronic diastolic (congestive) heart failure: Secondary | ICD-10-CM

## 2021-03-04 NOTE — Telephone Encounter (Signed)
Called to discuss PREP program, left voicemail asking for return call. 

## 2021-03-04 NOTE — Telephone Encounter (Signed)
Pt returned call; She wants to attend [facility], in October next class most likely T/Th; will contact her early October to confirm dates/times and set up assessment visit.

## 2021-03-05 ENCOUNTER — Encounter (HOSPITAL_COMMUNITY)
Admission: RE | Admit: 2021-03-05 | Discharge: 2021-03-05 | Disposition: A | Discharge status: Home / Self Care | Payer: Medicare Other | Source: Ambulatory Visit | Attending: Cardiology | Admitting: Cardiology

## 2021-03-05 ENCOUNTER — Other Ambulatory Visit: Payer: Self-pay

## 2021-03-05 DIAGNOSIS — I110 Hypertensive heart disease with heart failure: Secondary | ICD-10-CM | POA: Diagnosis not present

## 2021-03-05 DIAGNOSIS — E854 Organ-limited amyloidosis: Secondary | ICD-10-CM | POA: Diagnosis not present

## 2021-03-05 DIAGNOSIS — Z8679 Personal history of other diseases of the circulatory system: Secondary | ICD-10-CM | POA: Diagnosis not present

## 2021-03-05 MED ORDER — TECHNETIUM TC 99M PYROPHOSPHATE
20.3000 | Freq: Once | INTRAVENOUS | Status: AC | PRN
  Administered 2021-03-05: 20.3 via INTRAVENOUS
  Filled 2021-03-05: qty 21

## 2021-03-10 ENCOUNTER — Encounter (HOSPITAL_COMMUNITY): Payer: Self-pay

## 2021-03-11 DIAGNOSIS — M47812 Spondylosis without myelopathy or radiculopathy, cervical region: Secondary | ICD-10-CM | POA: Diagnosis not present

## 2021-03-13 ENCOUNTER — Encounter (HOSPITAL_COMMUNITY): Payer: Self-pay

## 2021-03-17 ENCOUNTER — Telehealth: Payer: Self-pay

## 2021-03-17 ENCOUNTER — Ambulatory Visit (INDEPENDENT_AMBULATORY_CARE_PROVIDER_SITE_OTHER): Payer: Medicare Other

## 2021-03-17 DIAGNOSIS — Z23 Encounter for immunization: Secondary | ICD-10-CM

## 2021-03-17 NOTE — Telephone Encounter (Signed)
Called to discuss PREP program schedule at [facility], she wants to attend next class starting October 18, every T/Th from 12-1:15; assessment visit scheduled for 10/12 at 1:30pm.

## 2021-03-19 ENCOUNTER — Other Ambulatory Visit: Payer: Self-pay | Admitting: Internal Medicine

## 2021-03-19 ENCOUNTER — Other Ambulatory Visit (INDEPENDENT_AMBULATORY_CARE_PROVIDER_SITE_OTHER): Payer: Medicare Other

## 2021-03-19 ENCOUNTER — Encounter: Payer: Self-pay | Admitting: Internal Medicine

## 2021-03-19 DIAGNOSIS — N390 Urinary tract infection, site not specified: Secondary | ICD-10-CM

## 2021-03-19 LAB — URINALYSIS, ROUTINE W REFLEX MICROSCOPIC
Bilirubin Urine: NEGATIVE
Ketones, ur: NEGATIVE
Leukocytes,Ua: NEGATIVE
Nitrite: NEGATIVE
RBC / HPF: NONE SEEN (ref 0–?)
Specific Gravity, Urine: 1.01 (ref 1.000–1.030)
Total Protein, Urine: NEGATIVE
Urine Glucose: NEGATIVE
Urobilinogen, UA: 0.2 (ref 0.0–1.0)
pH: 6 (ref 5.0–8.0)

## 2021-03-20 ENCOUNTER — Encounter: Payer: Self-pay | Admitting: Internal Medicine

## 2021-03-20 LAB — URINE CULTURE
MICRO NUMBER:: 12475614
Result:: NO GROWTH
SPECIMEN QUALITY:: ADEQUATE

## 2021-03-24 NOTE — Progress Notes (Signed)
[name] PREP Evaluation  Patient Details  Name: Renee [name] MRN: [id number] Date of Birth: [birth year] Age: 75 y.o. PCP: Renee Borg, MD  Vitals:   03/24/21 1434  BP: 140/74  Pulse: 84  SpO2: 97%  Weight: 169 lb 3.2 oz (76.7 kg)     [name] Eval - 03/24/21 1400       [name] "PREP" Location   [name] "PREP" Location [name]      Referral    Referring Provider Renee [name]    Reason for referral Hypertension;Inactivity;Obesitity/Overweight    Program Start Date 03/30/21      Measurement   Waist Circumference 37.5 inches    Hip Circumference 43.5 inches    Body fat 44.4 percent      Information for Trainer   Goals --   Establish an exercise routine, lost 12 lbs by end of 12 wks   Current Exercise None    Orthopedic Concerns --   Bilat THR, limited ROM R shoulder   Pertinent Medical History --   HTN, CAD, pacemaker     Timed Up and Go (TUGS)   Timed Up and Go Low risk <9 seconds      Mobility and Daily Activities   I find it easy to walk up or down two or more flights of stairs. 1    I have no trouble taking out the trash. 2    I do housework such as vacuuming and dusting on my own without difficulty. 1    I can easily lift a gallon of milk (8lbs). 3    I can easily walk a mile. 1    I have no trouble reaching into high cupboards or reaching down to pick up something from the floor. 4    I do not have trouble doing out-door work such as Armed forces logistics/support/administrative officer, raking leaves, or gardening. 4      Mobility and Daily Activities   I feel younger than my age. 4    I feel independent. 4    I feel energetic. 1    I live an active life.  1    I feel strong. 1    I feel healthy. 1    I feel active as other people my age. 1      How fit and strong are you.   Fit and Strong Total Score 29            Past Medical History:  Diagnosis Date   Allergy    tape, electrodes   Anxiety    AV nodal re-entry tachycardia ([name])    a. s/p RFA 1993- Dr. Rolland [name], [name]   Broken neck  [name])    1993   Cancer [name])    hx of cervical   ca   CHF (congestive heart failure) ([name])    Chronic chest pain    a. 2005 Cath: Mild nonobstructive plaque (30-40% LCX);  b. 08/2011 low risk myoview;  c. 09/2011 Coronary CT: NL cors w ca score of 0.   Chronic pain syndrome    Complication of anesthesia    blood pressure and heart dropped in 2009     Concussion    1979, 1993   Degenerative joint disease    a. s/p L Total Knee Arthroplasty.   Diet-controlled type 2 diabetes mellitus ([facility]e [name])    pt. denies   Diverticulosis of colon    Duodenitis without hemorrhage    Dysrhythmia    REPORTS , SINCE  PLACEMENT OF PACEMAKER, STILL CAN FEEL HEART " GOING IN AND OUT OF RYTHYN" REPORTS, YESTERDAY HER BP SYSTOLIC WAS IN THE 357S AND WAS BREATHLESS, HR 112, SWEATING ; DENIES SYNCOPE  , DENIES CHEST PAIN    Fibromyalgia    GERD (gastroesophageal reflux disease)    Hemorrhoids    Hypertension    Irritable bowel syndrome    Long QT interval    a. mild - advised to avoid meds that may prolong QT.   Myocardial infarction [name]) 09/1991   does not know when second heart attack was   Neuropathy    Osteoporosis    Paroxysmal A-fib ([name])    PER PATIENT. I CAN FEEL MY HEART RACING WHEN IM IN IT    Recurrent upper respiratory infection (URI)    SVT (supraventricular tachycardia) ([name])    a. nonsustained SVT - previously offered flecainide but refused;  b. 12/2012 30 day event monitor w/o significant arrhythmias.   Thyroid nodule    Past Surgical History:  Procedure Laterality Date   ABLATION OF DYSRHYTHMIC FOCUS  1993   ANTERIOR AND POSTERIOR REPAIR N/A 08/01/2017   Procedure: ANTERIOR (CYSTOCELE) AND POSTERIOR REPAIR (RECTOCELE);  Surgeon: Renee Loser, MD;  Location: [name];  Service: [name];  Laterality: N/A;   APPENDECTOMY     1980   BILATERAL SALPINGECTOMY Left 08/01/2017   Procedure: SALPINGECTOMY;  Surgeon: Renee Charleston, MD;  Location: [name];  Service: [name];  Laterality: Left;    BIOPSY THYROID     CARDIAC ELECTROPHYSIOLOGY STUDY AND ABLATION     atrioventricular nodal reentant tachycardia   CHOLECYSTECTOMY  1980   COLONOSCOPY  06/25/2008   Diverticulosis and Hemorrhoids   COLPOSCOPY     CORONARY ANGIOPLASTY  2007   CYSTOSCOPY N/A 08/01/2017   Procedure: CYSTOSCOPY;  Surgeon: Renee Loser, MD;  Location: [name];  Service: [facility]e [name];  Laterality: N/A;   EP IMPLANTABLE DEVICE N/A 03/03/2016   Procedure: Pacemaker Implant;  Surgeon: Renee Lance, MD;  Location: [name];  Service: Cardiovascular;  Laterality: N/A;   ESOPHAGOGASTRODUODENOSCOPY     HYSTEROSCOPY  2002   with resection of endometrial polyps. by Renee [name]   HYSTEROSCOPY WITH D & C N/A 02/05/2015   Procedure: DILATATION AND CURETTAGE /HYSTEROSCOPY;  Surgeon: Renee Kick, MD;  Location: [name];  Service: Renee [name];  Laterality: N/A;   LOOP RECORDER IMPLANT  08/21/2013   MDT LinQ implanted by Dr Lovena Le for syncope   LOOP RECORDER IMPLANT N/A 08/21/2013   Procedure: LOOP RECORDER IMPLANT;  Surgeon: Renee Lance, MD;  Location: [name];  Service: Cardiovascular;  Laterality: N/A;   ORIF RADIAL HEAD / NECK FRACTURE  09/05/88   TILT TABLE STUDY N/A 05/15/2013   Procedure: TILT TABLE STUDY;  Surgeon: Renee Sprang, MD;  Location: [name];  Service: Cardiovascular;  Laterality: N/A;   TOTAL HIP ARTHROPLASTY  10/10   Left, at [name] N/A 08/01/2017   Procedure: HYSTERECTOMY VAGINAL;  Surgeon: Renee Charleston, MD;  Location: [name];  Service: [name];  Laterality: N/A;   Social History   Tobacco Use  Smoking Status Never  Smokeless Tobacco Never   To begin PREP class at [name], October 18, every T/Th 12-1:15 Renee [name] 03/24/2021, 2:39 PM

## 2021-03-30 NOTE — Progress Notes (Signed)
[facility] PREP Weekly Session  Patient Details  Name: Renee [name] MRN: [id number] Date of Birth: [birth year] Age: 75 y.o. PCP: Biagio Borg, MD  There were no vitals filed for this visit.   [facility] Weekly seesion - 03/30/21 1400       [facility] "PREP" Location   [facility] "PREP" Location [facility]      Weekly Session   Topic Discussed Goal setting and welcome to the program   Introductions, review of PREP notebook, weekly self reports, tour of facility            [name] 03/30/2021, 2:17 PM

## 2021-04-06 ENCOUNTER — Encounter (HOSPITAL_COMMUNITY): Payer: Self-pay

## 2021-04-06 NOTE — Progress Notes (Signed)
[facility] PREP Weekly Session  Patient Details  Name: Renee [name] MRN: [id number] Date of Birth: 1945/07/13 Age: 75 y.o. PCP: Biagio Borg, MD  Vitals:   04/06/21 1340  Weight: 168 lb 12.8 oz (76.6 kg)     [facility] Weekly seesion - 04/06/21 1300       [facility] "PREP" Location   [facility] "PREP" Location [facility]      Weekly Session   Topic Discussed Importance of resistance training;Other;Other ways to be active   Fit testing completed;  Goals: 150 minutes cario/wk, strength training 2-3 times/wk for 20-40 mins; water intake: 1/2 body wt in oz or 64 oz/day initially   Minutes exercised this week 5 minutes    Classes attended to date 2             [place] 04/06/2021, 1:43 PM

## 2021-04-07 ENCOUNTER — Encounter (HOSPITAL_COMMUNITY): Payer: Self-pay

## 2021-04-09 ENCOUNTER — Encounter (HOSPITAL_COMMUNITY): Payer: Self-pay

## 2021-04-13 NOTE — Progress Notes (Signed)
[facility] PREP Weekly Session  Patient Details  Name: Renee [name] MRN: [id number] Date of Birth: Mar 22, 1946 Age: 75 y.o. PCP: Biagio Borg, MD  Vitals:   04/13/21 1518  Weight: 170 lb 9.6 oz (77.4 kg)     [facility] Weekly seesion - 04/13/21 1500       [facility] "PREP" Location   [facility] "PREP" Location [facility]      Weekly Session   Topic Discussed Healthy eating tips    Minutes exercised this week 35 minutes    Classes attended to date [place] 04/13/2021, 3:19 PM

## 2021-04-16 ENCOUNTER — Other Ambulatory Visit (HOSPITAL_COMMUNITY): Payer: Self-pay | Admitting: *Deleted

## 2021-04-16 ENCOUNTER — Other Ambulatory Visit (HOSPITAL_COMMUNITY): Payer: Self-pay

## 2021-04-16 MED ORDER — LOSARTAN POTASSIUM 50 MG PO TABS: 50.0000 mg | ORAL_TABLET | Freq: Two times a day (BID) | ORAL | 3 refills | Status: DC

## 2021-04-20 NOTE — Progress Notes (Signed)
[facility] PREP Weekly Session  Patient Details  Name: Renee [name] MRN: [id number] Date of Birth: 07-04-1945 Age: 75 y.o. PCP: Biagio Borg, MD  Vitals:   04/20/21 1346  Weight: 168 lb 6.4 oz (76.4 kg)     [facility] Weekly seesion - 04/20/21 1300       [facility] "PREP" Location   [facility] "PREP" Location [place]      Weekly Session   Topic Discussed Health habits   sugar demo   Minutes exercised this week 90 minutes    Classes attended to date [place] 04/20/2021, 1:47 PM

## 2021-04-21 ENCOUNTER — Other Ambulatory Visit: Payer: Self-pay

## 2021-04-21 ENCOUNTER — Ambulatory Visit (HOSPITAL_COMMUNITY)
Admission: RE | Admit: 2021-04-21 | Discharge: 2021-04-21 | Disposition: A | Discharge status: Home / Self Care | Payer: Medicare Other | Source: Ambulatory Visit | Attending: Cardiology | Admitting: Cardiology

## 2021-04-21 ENCOUNTER — Ambulatory Visit (HOSPITAL_COMMUNITY)
Admission: RE | Admit: 2021-04-21 | Discharge: 2021-04-21 | Disposition: A | Discharge status: Home / Self Care | Payer: Medicare Other | Source: Ambulatory Visit | Attending: Family Medicine | Admitting: Family Medicine

## 2021-04-21 ENCOUNTER — Encounter (HOSPITAL_COMMUNITY): Payer: Self-pay

## 2021-04-21 ENCOUNTER — Other Ambulatory Visit (HOSPITAL_COMMUNITY): Payer: Self-pay | Admitting: Cardiology

## 2021-04-21 ENCOUNTER — Other Ambulatory Visit (HOSPITAL_COMMUNITY): Payer: Self-pay | Admitting: *Deleted

## 2021-04-21 VITALS — BP 130/76 | HR 69 | Wt 168.4 lb

## 2021-04-21 DIAGNOSIS — I5032 Chronic diastolic (congestive) heart failure: Secondary | ICD-10-CM

## 2021-04-21 DIAGNOSIS — R55 Syncope and collapse: Secondary | ICD-10-CM | POA: Diagnosis not present

## 2021-04-21 DIAGNOSIS — I480 Paroxysmal atrial fibrillation: Secondary | ICD-10-CM | POA: Diagnosis not present

## 2021-04-21 DIAGNOSIS — R079 Chest pain, unspecified: Secondary | ICD-10-CM | POA: Diagnosis not present

## 2021-04-21 NOTE — Addendum Note (Signed)
Encounter addended by: Joette Catching, PA-C on: 04/21/2021 11:49 AM  Actions taken: Level of Service modified

## 2021-04-21 NOTE — Addendum Note (Signed)
Encounter addended by: Joette Catching, PA-C on: 04/21/2021 11:48 AM  Actions taken: Clinical Note Signed
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
## 2021-04-27 ENCOUNTER — Ambulatory Visit (HOSPITAL_COMMUNITY)
Admission: RE | Admit: 2021-04-27 | Discharge: 2021-04-27 | Disposition: A | Discharge status: Home / Self Care | Payer: Medicare Other | Source: Ambulatory Visit | Attending: Cardiology | Admitting: Cardiology

## 2021-04-27 ENCOUNTER — Encounter (HOSPITAL_COMMUNITY): Payer: Self-pay | Admitting: Cardiology

## 2021-04-27 VITALS — BP 138/80 | HR 62 | Wt 169.8 lb

## 2021-04-27 DIAGNOSIS — G8929 Other chronic pain: Secondary | ICD-10-CM | POA: Insufficient documentation

## 2021-04-27 DIAGNOSIS — I208 Other forms of angina pectoris: Secondary | ICD-10-CM

## 2021-04-27 DIAGNOSIS — I480 Paroxysmal atrial fibrillation: Secondary | ICD-10-CM | POA: Insufficient documentation

## 2021-04-27 DIAGNOSIS — Z79899 Other long term (current) drug therapy: Secondary | ICD-10-CM | POA: Insufficient documentation

## 2021-04-27 DIAGNOSIS — I4581 Long QT syndrome: Secondary | ICD-10-CM | POA: Insufficient documentation

## 2021-04-27 DIAGNOSIS — I110 Hypertensive heart disease with heart failure: Secondary | ICD-10-CM | POA: Insufficient documentation

## 2021-04-27 DIAGNOSIS — I471 Supraventricular tachycardia: Secondary | ICD-10-CM | POA: Insufficient documentation

## 2021-04-27 DIAGNOSIS — I5032 Chronic diastolic (congestive) heart failure: Secondary | ICD-10-CM | POA: Insufficient documentation

## 2021-04-27 MED ORDER — ISOSORBIDE MONONITRATE ER 30 MG PO TB24: 30.0000 mg | ORAL_TABLET | Freq: Every day | ORAL | 3 refills | Status: DC

## 2021-04-27 NOTE — Addendum Note (Signed)
Addended by: Micki Riley on: 04/27/2021 02:09 PM   Modules accepted: Orders
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
## 2021-04-27 NOTE — Patient Instructions (Signed)
Start Imdur 30mg  Daily. Your physician recommends that you schedule a follow-up appointment in: 3 months. (February 2023).  If you have any questions or concerns before your next appointment please send [place] a message through [place] or call our office at [phone].    TO LEAVE A MESSAGE FOR THE NURSE SELECT OPTION 2, PLEASE LEAVE A MESSAGE INCLUDING: YOUR NAME DATE OF BIRTH CALL BACK NUMBER REASON FOR CALL**this is important as we prioritize the call backs  YOU WILL RECEIVE A CALL BACK THE SAME DAY AS LONG AS YOU CALL BEFORE 4:00 PM  At the [facility], you and your health needs are our priority. As part of our continuing mission to provide you with exceptional heart care, we have created designated Provider Care Teams. These Care Teams include your primary Cardiologist (physician) and Advanced Practice Providers (APPs- Physician Assistants and Nurse Practitioners) who all work together to provide you with the care you need, when you need it.   You may see any of the following providers on your designated Care Team at your next follow up: Dr Glori Bickers Dr Haynes Kerns, NP Lyda Jester, [place] [facility] [place], [place] Audry Riles, PharmD   Please be sure to bring in all your medications bottles to every appointment.

## 2021-05-04 ENCOUNTER — Encounter (HOSPITAL_COMMUNITY): Payer: Self-pay | Admitting: Cardiology

## 2021-05-05 ENCOUNTER — Encounter (HOSPITAL_COMMUNITY): Payer: Medicare Other

## 2021-05-05 ENCOUNTER — Other Ambulatory Visit: Payer: Self-pay

## 2021-05-05 ENCOUNTER — Ambulatory Visit (HOSPITAL_COMMUNITY)
Admission: RE | Admit: 2021-05-05 | Discharge: 2021-05-05 | Disposition: A | Discharge status: Home / Self Care | Payer: Medicare Other | Source: Ambulatory Visit | Attending: Cardiology | Admitting: Cardiology

## 2021-05-05 ENCOUNTER — Other Ambulatory Visit (HOSPITAL_COMMUNITY): Payer: Self-pay | Admitting: Cardiology

## 2021-05-05 DIAGNOSIS — R55 Syncope and collapse: Secondary | ICD-10-CM

## 2021-05-05 NOTE — Progress Notes (Addendum)
Zio patch placed onto patient.  All instructions and information reviewed with patient, they verbalize understanding with no questions.  Patient was irate and explained how she doesn't trust this device to deliver the necessary answers she needs as to why she keeps almost passing out and that she doesn't trust the device due to her never hearing of it. Patient also reported that she sweats excessively and has been doing so for the last 30-40 years so her wearing the monitor is going to be a problem. I offered patient to be referred to [facility] for a Holter monitor and she said that she doesn't have time for them to call her. She stated that it seem like Dr. Aundra Dubin should have known not to put the Zio monitor on her since she is allergic to adhesive and questioned his judgment on the whole process. I advised Renee [name] that this is the standard for when patients complain of nearly passing out.  Patient left the office with zio being placed on her back due to her ICD.   Patient called again after leaving office stating how dissatisfied she was and reiterating what she said during her appointment and again questioning Dr. Oleh Genin judgement. I confirmed that she saw the message from Dr. Aundra Dubin via Renee [name] which said he wanted her to wear the monitor. She then stated that she was not going to debate with me and would send him a mychart message.

## 2021-05-10 ENCOUNTER — Ambulatory Visit (INDEPENDENT_AMBULATORY_CARE_PROVIDER_SITE_OTHER): Payer: Medicare Other

## 2021-05-10 DIAGNOSIS — I495 Sick sinus syndrome: Secondary | ICD-10-CM | POA: Diagnosis not present

## 2021-05-10 LAB — CUP PACEART REMOTE DEVICE CHECK
Date Time Interrogation Session: 20221128130711
Implantable Lead Implant Date: 20170921
Implantable Lead Implant Date: 20170921
Implantable Lead Location: 753859
Implantable Lead Location: 753860
Implantable Lead Model: 377
Implantable Lead Model: 377
Implantable Lead Serial Number: 49575519
Implantable Lead Serial Number: 49580836
Implantable Pulse Generator Implant Date: 20170921
Pulse Gen Model: 407145
Pulse Gen Serial Number: 68850891

## 2021-05-11 NOTE — Progress Notes (Signed)
[employer] PREP Weekly Session  Patient Details  Name: Renee [name] MRN: [id number] Date of Birth: Oct 13, 1945 Age: 75 y.o. PCP: Biagio Borg, MD  Vitals:   05/11/21 1541  Weight: 170 lb 12.8 oz (77.5 kg)     [facility] Weekly seesion - 05/11/21 1500       [facility] "PREP" Location   [facility] "PREP" Location [facility]      Weekly Session   Topic Discussed Expectations and non-scale victories   Halfway through program; encouraged to review, refocus, revisit, restate goals   Minutes exercised this week 55 minutes    Classes attended to date [place] 05/11/2021, 3:42 PM

## 2021-05-14 DIAGNOSIS — H1789 Other corneal scars and opacities: Secondary | ICD-10-CM | POA: Diagnosis not present

## 2021-05-17 DIAGNOSIS — R55 Syncope and collapse: Secondary | ICD-10-CM | POA: Diagnosis not present

## 2021-05-17 NOTE — Progress Notes (Signed)
Remote pacemaker transmission.   

## 2021-06-10 ENCOUNTER — Other Ambulatory Visit: Payer: Self-pay | Admitting: Gastroenterology

## 2021-06-30 NOTE — Progress Notes (Signed)
PREP class attendance:  Education sessions attended: 6 Workouts completed: 4 Program ended 06/24/2021

## 2021-07-19 ENCOUNTER — Telehealth (HOSPITAL_COMMUNITY): Payer: Self-pay | Admitting: *Deleted

## 2021-07-19 NOTE — Telephone Encounter (Signed)
Pt left vm stating she feels bad and her bp is 125/50 heart rate 72 and yesterday her bp was 100/62. I called pt back to get more information no answer/ vm full.

## 2021-07-23 ENCOUNTER — Encounter (HOSPITAL_COMMUNITY): Payer: Self-pay | Admitting: Cardiology

## 2021-07-28 ENCOUNTER — Other Ambulatory Visit: Payer: Self-pay

## 2021-07-28 ENCOUNTER — Encounter (HOSPITAL_COMMUNITY): Payer: Self-pay | Admitting: Cardiology

## 2021-07-28 ENCOUNTER — Ambulatory Visit (HOSPITAL_COMMUNITY)
Admission: RE | Admit: 2021-07-28 | Discharge: 2021-07-28 | Disposition: A | Discharge status: Home / Self Care | Payer: Medicare Other | Source: Ambulatory Visit | Attending: Cardiology | Admitting: Cardiology

## 2021-07-28 VITALS — BP 117/77 | HR 77 | Temp 100.1°F | Resp 20 | Wt 166.6 lb

## 2021-07-28 DIAGNOSIS — R0789 Other chest pain: Secondary | ICD-10-CM | POA: Diagnosis not present

## 2021-07-28 DIAGNOSIS — Z950 Presence of cardiac pacemaker: Secondary | ICD-10-CM | POA: Insufficient documentation

## 2021-07-28 DIAGNOSIS — I110 Hypertensive heart disease with heart failure: Secondary | ICD-10-CM | POA: Diagnosis not present

## 2021-07-28 DIAGNOSIS — Z79899 Other long term (current) drug therapy: Secondary | ICD-10-CM | POA: Diagnosis not present

## 2021-07-28 DIAGNOSIS — R002 Palpitations: Secondary | ICD-10-CM | POA: Diagnosis not present

## 2021-07-28 DIAGNOSIS — I471 Supraventricular tachycardia: Secondary | ICD-10-CM | POA: Insufficient documentation

## 2021-07-28 DIAGNOSIS — I442 Atrioventricular block, complete: Secondary | ICD-10-CM | POA: Insufficient documentation

## 2021-07-28 DIAGNOSIS — R55 Syncope and collapse: Secondary | ICD-10-CM | POA: Insufficient documentation

## 2021-07-28 DIAGNOSIS — I472 Ventricular tachycardia, unspecified: Secondary | ICD-10-CM | POA: Diagnosis not present

## 2021-07-28 DIAGNOSIS — I5032 Chronic diastolic (congestive) heart failure: Secondary | ICD-10-CM | POA: Diagnosis not present

## 2021-07-28 DIAGNOSIS — G8929 Other chronic pain: Secondary | ICD-10-CM | POA: Insufficient documentation

## 2021-07-28 DIAGNOSIS — I4581 Long QT syndrome: Secondary | ICD-10-CM | POA: Diagnosis not present

## 2021-07-28 DIAGNOSIS — I480 Paroxysmal atrial fibrillation: Secondary | ICD-10-CM | POA: Diagnosis not present

## 2021-07-28 LAB — BASIC METABOLIC PANEL
Anion gap: 7 (ref 5–15)
BUN: 11 mg/dL (ref 8–23)
CO2: 26 mmol/L (ref 22–32)
Calcium: 9.6 mg/dL (ref 8.9–10.3)
Chloride: 107 mmol/L (ref 98–111)
Creatinine, Ser: 0.76 mg/dL (ref 0.44–1.00)
GFR, Estimated: >60 mL/min (ref >60)
Glucose, Bld: 88 mg/dL (ref 70–99)
Potassium: 4 mmol/L (ref 3.5–5.1)
Sodium: 140 mmol/L (ref 135–145)

## 2021-07-28 LAB — CBC
HCT: 39.1 % (ref 36.0–46.0)
Hemoglobin: 13 g/dL (ref 12.0–15.0)
MCH: 26.9 pg (ref 26.0–34.0)
MCHC: 33.2 g/dL (ref 30.0–36.0)
MCV: 80.8 fL (ref 80.0–100.0)
Platelets: 240 10*3/uL (ref 150–400)
RBC: 4.84 MIL/uL (ref 3.87–5.11)
RDW: 13 % (ref 11.5–15.5)
WBC: 5.7 10*3/uL (ref 4.0–10.5)
nRBC: 0 % (ref 0.0–0.2)

## 2021-07-28 NOTE — Patient Instructions (Signed)
Thank you for your visit today.   There has been no changes to your medications.  Labs done today, your results will be available in MyChart, we will contact you for abnormal readings.  Your physician recommends that you schedule a follow-up appointment in: 6 months (August 2023) ** please call the office in June to schedule your follow up appointment**  If you have any questions or concerns before your next appointment please send [place] a message through [place] or call our office at [phone].    TO LEAVE A MESSAGE FOR THE NURSE SELECT OPTION 2, PLEASE LEAVE A MESSAGE INCLUDING: YOUR NAME DATE OF BIRTH CALL BACK NUMBER REASON FOR CALL**this is important as we prioritize the call backs  YOU WILL RECEIVE A CALL BACK THE SAME DAY AS LONG AS YOU CALL BEFORE 4:00 PM  At the [facility], you and your health needs are our priority. As part of our continuing mission to provide you with exceptional heart care, we have created designated Provider Care Teams. These Care Teams include your primary Cardiologist (physician) and Advanced Practice Providers (APPs- Physician Assistants and Nurse Practitioners) who all work together to provide you with the care you need, when you need it.   You may see any of the following providers on your designated Care Team at your next follow up: Dr Glori Bickers Dr Haynes Kerns, NP Lyda Jester, [place] [facility] [place], [place] Audry Riles, PharmD   Please be sure to bring in all your medications bottles to every appointment.
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
## 2021-08-09 ENCOUNTER — Ambulatory Visit (INDEPENDENT_AMBULATORY_CARE_PROVIDER_SITE_OTHER): Payer: Medicare Other

## 2021-08-09 DIAGNOSIS — I495 Sick sinus syndrome: Secondary | ICD-10-CM | POA: Diagnosis not present

## 2021-08-09 LAB — CUP PACEART REMOTE DEVICE CHECK
Date Time Interrogation Session: 20230227121648
Implantable Lead Implant Date: 20170921
Implantable Lead Implant Date: 20170921
Implantable Lead Location: 753859
Implantable Lead Location: 753860
Implantable Lead Model: 377
Implantable Lead Model: 377
Implantable Lead Serial Number: 49575519
Implantable Lead Serial Number: 49580836
Implantable Pulse Generator Implant Date: 20170921
Pulse Gen Model: 407145
Pulse Gen Serial Number: 68850891

## 2021-08-11 NOTE — Progress Notes (Signed)
Remote pacemaker transmission.   

## 2021-08-25 ENCOUNTER — Other Ambulatory Visit: Payer: Self-pay | Admitting: Cardiology

## 2021-08-27 ENCOUNTER — Encounter (HOSPITAL_COMMUNITY): Payer: Self-pay | Admitting: Cardiology

## 2021-09-21 ENCOUNTER — Other Ambulatory Visit (HOSPITAL_COMMUNITY): Payer: Self-pay

## 2021-09-21 MED ORDER — LOSARTAN POTASSIUM 50 MG PO TABS: 50.0000 mg | ORAL_TABLET | Freq: Two times a day (BID) | ORAL | 0 refills | Status: DC

## 2021-10-06 ENCOUNTER — Encounter (HOSPITAL_COMMUNITY): Payer: Self-pay | Admitting: Cardiology

## 2021-10-11 ENCOUNTER — Telehealth: Payer: Self-pay | Admitting: Cardiology

## 2021-10-11 NOTE — Telephone Encounter (Signed)
Reviewed remote transmission and no alerts noted on device. Patient updated and advised to follow up with PCP. ?

## 2021-10-11 NOTE — Telephone Encounter (Signed)
Patient called in stating she had an episode on Friday night and is requesting documentation of her transmission from that night. Spoke with Kem in device who stated that the device RN was on the phone and will call her back.  ?

## 2021-11-03 ENCOUNTER — Other Ambulatory Visit (HOSPITAL_COMMUNITY): Payer: Self-pay | Admitting: *Deleted

## 2021-11-03 ENCOUNTER — Telehealth: Payer: Self-pay | Admitting: Gastroenterology

## 2021-11-03 ENCOUNTER — Encounter (HOSPITAL_COMMUNITY): Payer: Self-pay | Admitting: Cardiology

## 2021-11-03 MED ORDER — LOSARTAN POTASSIUM 50 MG PO TABS: 50.0000 mg | ORAL_TABLET | Freq: Two times a day (BID) | ORAL | 3 refills | Status: DC

## 2021-11-03 NOTE — Telephone Encounter (Signed)
Inbound call from patient stating she is in need of a refill for omeprazole. Please advise.

## 2021-11-04 ENCOUNTER — Other Ambulatory Visit (HOSPITAL_COMMUNITY): Payer: Self-pay

## 2021-11-04 MED ORDER — DILTIAZEM HCL ER COATED BEADS 240 MG PO CP24: 240.0000 mg | ORAL_CAPSULE | Freq: Every morning | ORAL | 3 refills | Status: DC

## 2021-11-04 NOTE — Telephone Encounter (Signed)
Informed patient she needs to schedule a f/u visit with Dr. Fuller Plan to get a refill of her omeprazole. Patient reports she was told she doesn't need to come into the office until she is due for her colonoscopy. Informed patient he has to see his patient's every year if he is prescribing medications to them. Patient states she is at an even for her work and she will call me back.

## 2021-11-09 ENCOUNTER — Ambulatory Visit (INDEPENDENT_AMBULATORY_CARE_PROVIDER_SITE_OTHER): Payer: Medicare Other

## 2021-11-09 DIAGNOSIS — I495 Sick sinus syndrome: Secondary | ICD-10-CM

## 2021-11-09 LAB — CUP PACEART REMOTE DEVICE CHECK
Date Time Interrogation Session: 20230530115412
Implantable Lead Implant Date: 20170921
Implantable Lead Implant Date: 20170921
Implantable Lead Location: 753859
Implantable Lead Location: 753860
Implantable Lead Model: 377
Implantable Lead Model: 377
Implantable Lead Serial Number: 49575519
Implantable Lead Serial Number: 49580836
Implantable Pulse Generator Implant Date: 20170921
Pulse Gen Model: 407145
Pulse Gen Serial Number: 68850891

## 2021-11-15 ENCOUNTER — Other Ambulatory Visit (INDEPENDENT_AMBULATORY_CARE_PROVIDER_SITE_OTHER): Payer: Medicare Other

## 2021-11-15 ENCOUNTER — Ambulatory Visit: Payer: Medicare Other | Admitting: Gastroenterology

## 2021-11-15 ENCOUNTER — Encounter: Payer: Self-pay | Admitting: Gastroenterology

## 2021-11-15 VITALS — BP 140/70 | HR 68 | Ht 63.0 in | Wt 171.0 lb

## 2021-11-15 DIAGNOSIS — R197 Diarrhea, unspecified: Secondary | ICD-10-CM | POA: Diagnosis not present

## 2021-11-15 DIAGNOSIS — K921 Melena: Secondary | ICD-10-CM

## 2021-11-15 DIAGNOSIS — R1030 Lower abdominal pain, unspecified: Secondary | ICD-10-CM | POA: Diagnosis not present

## 2021-11-15 LAB — TSH: TSH: 1.31 u[IU]/mL (ref 0.35–5.50)

## 2021-11-15 MED ORDER — OMEPRAZOLE 40 MG PO CPDR: DELAYED_RELEASE_CAPSULE | ORAL | 3 refills | Status: DC

## 2021-11-15 MED ORDER — NA SULFATE-K SULFATE-MG SULF 17.5-3.13-1.6 GM/177ML PO SOLN: 1.0000 | Freq: Once | ORAL | 0 refills | Status: AC

## 2021-11-15 NOTE — Patient Instructions (Signed)
Your provider has requested that you go to the basement level for lab work before leaving today. Press "B" on the elevator. The lab is located at the first door on the left as you exit the elevator.  We have sent the following medications to your pharmacy for you to pick up at your convenience: omeprazole.   Please do Kegel exercises four times a day.   You have been scheduled for a colonoscopy. Please follow written instructions given to you at your visit today.  Please pick up your prep supplies at the pharmacy within the next 1-3 days. If you use inhalers (even only as needed), please bring them with you on the day of your procedure.  The [place] [facility] providers would like to encourage you to use [facility] to communicate with providers for non-urgent requests or questions.  Due to long hold times on the telephone, sending your provider a message by [facility] may be a faster and more efficient way to get a response.  Please allow 48 business hours for a response.  Please remember that this is for non-urgent requests.   Due to recent changes in healthcare laws, you may see the results of your imaging and laboratory studies on MyChart before your provider has had a chance to review them.  We understand that in some cases there may be results that are confusing or concerning to you. Not all laboratory results come back in the same time frame and the provider may be waiting for multiple results in order to interpret others.  Please give [place] 48 hours in order for your provider to thoroughly review all the results before contacting the office for clarification of your results.   Thank you for choosing me and [facility].  Pricilla Riffle. Dagoberto Ligas., MD., [facility]  Kegel Exercises  Kegel exercises can help strengthen your pelvic floor muscles. The pelvic floor is a group of muscles that support your rectum, small intestine, and bladder. In females, pelvic floor muscles also help support the uterus. These  muscles help you control the flow of urine and stool (feces). Kegel exercises are painless and simple. They do not require any equipment. Your provider may suggest Kegel exercises to: Improve bladder and bowel control. Improve sexual response. Improve weak pelvic floor muscles after surgery to remove the uterus (hysterectomy) or after pregnancy, in females. Improve weak pelvic floor muscles after prostate gland removal or surgery, in males. Kegel exercises involve squeezing your pelvic floor muscles. These are the same muscles you squeeze when you try to stop the flow of urine or keep from passing gas. The exercises can be done while sitting, standing, or lying down, but it is best to vary your position. Ask your health care provider which exercises are safe for you. Do exercises exactly as told by your health care provider and adjust them as directed. Do not begin these exercises until told by your health care provider. Exercises How to do Kegel exercises: Squeeze your pelvic floor muscles tight. You should feel a tight lift in your rectal area. If you are a female, you should also feel a tightness in your vaginal area. Keep your stomach, buttocks, and legs relaxed. Hold the muscles tight for up to 10 seconds. Breathe normally. Relax your muscles for up to 10 seconds. Repeat as told by your health care provider. Repeat this exercise daily as told by your health care provider. Continue to do this exercise for at least 4-6 weeks, or for as long as told  by your health care provider. You may be referred to a physical therapist who can help you learn more about how to do Kegel exercises. Depending on your condition, your health care provider may recommend: Varying how long you squeeze your muscles. Doing several sets of exercises every day. Doing exercises for several weeks. Making Kegel exercises a part of your regular exercise routine. This information is not intended to replace advice given to  you by your health care provider. Make sure you discuss any questions you have with your health care provider. Document Revised: 10/08/2020 Document Reviewed: 10/08/2020 Elsevier Patient Education  [place].

## 2021-11-15 NOTE — Progress Notes (Signed)
Assessment     Alternating diarrhea and constipation with intermittent lower abdominal pain Intermittent fecal incontinence with decreased sphincter tone Suspected hemorrhoidal symptoms including bleeding, R/O colorectal neoplasms Personal history of adenomatous colon polyps GERD   Recommendations    tTG, IgA, TSH Continue dicyclomine 10 mg po tid as needed for abdominal pain Rectal care instructions MiraLAX daily as needed for constipation Kegel exercises qid long-term Continue omeprazole 40 mg p.o. twice daily and antireflux measures Schedule colonoscopy. The risks (including bleeding, perforation, infection, missed lesions, medication reactions and possible hospitalization or surgery if complications occur), benefits, and alternatives to colonoscopy with possible biopsy and possible polypectomy were discussed with the patient and they consent to proceed.      HPI    This is a 76 year old female with alternating diarrhea and constipation, difficulty evacuating stool, rectal pressure, intermittent rectal bleeding, intermittent lower abdominal crampy pain.  His symptoms have been present for many years.  More recently she has experienced intermittent difficulties with fecal incontinence.  Her reflux symptoms are well controlled.  She has a history of adenomatous colon polyps with her last colonoscopy performed in March 2020 as below.  Denies weight loss, change in stool caliber, melena, nausea, vomiting, dysphagia, chest pain.    Labs / Imaging       Latest Ref Rng & Units 12/16/2020   11:59 AM 07/21/2020    5:09 PM 03/31/2020    2:21 PM  Hepatic Function  Total Protein 6.5 - 8.1 g/dL 6.7   7.0   7.0    Albumin 3.5 - 5.0 g/dL 4.1   4.3   4.1    AST 15 - 41 U/L $Remo'19   11   16    'NfoSn$ ALT 0 - 44 U/L $Remo'21   12   14    'GojCd$ Alk Phosphatase 38 - 126 U/L 125   107   135    Total Bilirubin 0.3 - 1.2 mg/dL 1.1   0.9   1.2    Bilirubin, Direct 0.0 - 0.3 mg/dL  0.2          Latest Ref Rng &  Units 07/28/2021   12:05 PM 12/16/2020   11:59 AM 10/26/2020    8:40 AM  CBC  WBC 4.0 - 10.5 K/uL 5.7   5.1   4.8    Hemoglobin 12.0 - 15.0 g/dL 13.0   12.8   12.9    Hematocrit 36.0 - 46.0 % 39.1   40.3   39.7    Platelets 150 - 400 K/uL 240   255   241      Colonoscopy 08/2018 - Internal, external hemorrhoids - Cecal polyp, sessile, removed by cold snare. - Mild left colon diverticulosis. - Otherwise normal appearing colonoscopy.   Current Medications, Allergies, Past Medical History, Past Surgical History, Family History and Social History were reviewed in [employer] record.   Physical Exam: General: Well developed, well nourished, no acute distress Head: Normocephalic and atraumatic Eyes: Sclerae anicteric, EOMI Ears: Normal auditory acuity Mouth: Not examined Lungs: Clear throughout to auscultation Heart: Regular rate and rhythm; no murmurs, rubs or bruits Abdomen: Soft, non tender and non distended. No masses, hepatosplenomegaly or hernias noted. Normal Bowel sounds Rectal: Small external hemorrhoidal tags, no internal lesions, no tenderness, decreased sphincter tone and decreased anal squeeze, Hemoccult negative brown stool in the vault Musculoskeletal: Symmetrical with no gross deformities  Pulses:  Normal pulses noted Extremities: No clubbing, cyanosis, edema or deformities  noted Neurological: Alert oriented x 4, grossly nonfocal Psychological:  Alert and cooperative. Normal mood and affect   Geena Weinhold T. Fuller Plan, MD 11/15/2021, 11:15 AM

## 2021-11-16 LAB — TISSUE TRANSGLUTAMINASE, IGA: (tTG) Ab, IgA: <1 U/mL

## 2021-11-16 LAB — IGA: Immunoglobulin A: 188 mg/dL (ref 70–320)

## 2021-11-24 ENCOUNTER — Other Ambulatory Visit (HOSPITAL_COMMUNITY): Payer: Self-pay | Admitting: Cardiology

## 2021-11-24 NOTE — Progress Notes (Signed)
Remote pacemaker transmission.   

## 2021-11-25 ENCOUNTER — Other Ambulatory Visit (HOSPITAL_COMMUNITY): Payer: Self-pay | Admitting: Cardiology

## 2021-11-29 ENCOUNTER — Encounter (HOSPITAL_COMMUNITY): Payer: Self-pay | Admitting: Cardiology

## 2021-11-29 ENCOUNTER — Telehealth: Payer: Self-pay

## 2021-11-29 NOTE — Telephone Encounter (Signed)
Spoke with patient regarding lab work. Patient verbalized all understanding, no further questions.

## 2021-12-02 ENCOUNTER — Telehealth (HOSPITAL_COMMUNITY): Payer: Self-pay

## 2021-12-02 NOTE — Telephone Encounter (Signed)
Tried calling patient to let her know that we have her scheduled for 08/08 at 10.20 am. Was unable to leave message as her mailbox is full

## 2021-12-15 DIAGNOSIS — M1992 Post-traumatic osteoarthritis, unspecified site: Secondary | ICD-10-CM | POA: Insufficient documentation

## 2021-12-15 DIAGNOSIS — M19121 Post-traumatic osteoarthritis, right elbow: Secondary | ICD-10-CM | POA: Diagnosis not present

## 2021-12-15 DIAGNOSIS — M19011 Primary osteoarthritis, right shoulder: Secondary | ICD-10-CM | POA: Diagnosis not present

## 2021-12-15 DIAGNOSIS — M25521 Pain in right elbow: Secondary | ICD-10-CM | POA: Insufficient documentation

## 2021-12-15 DIAGNOSIS — M25511 Pain in right shoulder: Secondary | ICD-10-CM | POA: Diagnosis not present

## 2021-12-28 ENCOUNTER — Encounter: Payer: Self-pay | Admitting: Gastroenterology

## 2022-01-02 DIAGNOSIS — R0789 Other chest pain: Secondary | ICD-10-CM | POA: Diagnosis not present

## 2022-01-02 DIAGNOSIS — R079 Chest pain, unspecified: Secondary | ICD-10-CM | POA: Diagnosis not present

## 2022-01-03 ENCOUNTER — Other Ambulatory Visit: Payer: Self-pay

## 2022-01-03 MED ORDER — NA SULFATE-K SULFATE-MG SULF 17.5-3.13-1.6 GM/177ML PO SOLN: 1.0000 | Freq: Once | ORAL | 0 refills | Status: AC

## 2022-01-04 ENCOUNTER — Encounter: Payer: Medicare Other | Admitting: Gastroenterology

## 2022-01-04 ENCOUNTER — Telehealth: Payer: Self-pay | Admitting: Gastroenterology

## 2022-01-04 ENCOUNTER — Telehealth (HOSPITAL_COMMUNITY): Payer: Self-pay | Admitting: *Deleted

## 2022-01-04 ENCOUNTER — Telehealth: Payer: Self-pay

## 2022-01-04 NOTE — Telephone Encounter (Signed)
The patient called because she had to call the ambulance Sunday. I let her speak with Leigh, rn.

## 2022-01-04 NOTE — Telephone Encounter (Signed)
Spoke with pt-appt scheduled

## 2022-01-04 NOTE — Telephone Encounter (Signed)
Pt left vm stating Sunday she had to call the EMS because of bad chest pain. Pt had to take 3 nitroglycerin tabs and ems had her take four '81mg'$  asp. Pts ekg was normal and she did not go to the hospital. Pt said she expects a call from Sultan himself.   Routed to [place]

## 2022-01-04 NOTE — Telephone Encounter (Signed)
Biotronik checked online and no events were triggered. Patient advised to call Dr. Aundra Dubin considering she experienced chest pain and called 911. Patient states she will call as soon as we hang up.

## 2022-01-04 NOTE — Telephone Encounter (Signed)
OnCall Note  [facility] pharmacist called after hours to inform me that patient has noted long QT syndrome, has drug interaction with Suprep and when she went to pick up her bowel prep , she also informed him that she had an episode of chest pain July 23rd, 2023 AM for which she called EMS, took 3 SL NTG and ASA.   I called patient and she confirmed that she had an episode of severe chest pain Sunday morning, EMS came to her home but didn't take her to ER because chest pain was relieved after she took 3 sub lingual nitroglycerine and Aspirin. She denies any ongoing chest pain or SOB.  Advised patient to hold off bowel prep and we will cancel procedure, plan to reschedule once cardiac work up is done and has pre op assessment/clearance by Cardiology.   Patient has h/o sinus node dysfunction, long QT syndrome s/p AVNRT ablation, SVT, CHF and chronic chest pain syndrome  She has been communicating with Cardiology for sooner appointment since June 2023,for chest pain, lower leg edema and exertional dyspnea, is scheduled in Aug 2023 Advised patient to contact her Primary cardiologist or come to ER if has recurrent chest pain or worsening symptoms.  Damaris Hippo , MD [phone]

## 2022-01-04 NOTE — Telephone Encounter (Signed)
Most likely it is her microvascular angina/coronary vasospasm but has been over 2 years since her coronary CTA and may be beneficial to repeat.  She also is going to need clearance for GI procedure now that she had this chest pain episode.  Please work her in with me this week and we will talk in the office. If she has another CP episode that severe before she is worked in to see me, go to ER.

## 2022-01-06 ENCOUNTER — Other Ambulatory Visit: Payer: Self-pay

## 2022-01-06 ENCOUNTER — Emergency Department (HOSPITAL_COMMUNITY)
Admission: EM | Admit: 2022-01-06 | Discharge: 2022-01-06 | Disposition: A | Discharge status: Home / Self Care | Payer: Medicare Other | Attending: Emergency Medicine | Admitting: Emergency Medicine

## 2022-01-06 ENCOUNTER — Emergency Department (HOSPITAL_COMMUNITY): Payer: Medicare Other

## 2022-01-06 ENCOUNTER — Encounter (HOSPITAL_COMMUNITY): Payer: Self-pay

## 2022-01-06 ENCOUNTER — Encounter (HOSPITAL_COMMUNITY): Payer: Self-pay | Admitting: Cardiology

## 2022-01-06 DIAGNOSIS — R42 Dizziness and giddiness: Secondary | ICD-10-CM | POA: Diagnosis not present

## 2022-01-06 DIAGNOSIS — Z7982 Long term (current) use of aspirin: Secondary | ICD-10-CM | POA: Insufficient documentation

## 2022-01-06 DIAGNOSIS — R059 Cough, unspecified: Secondary | ICD-10-CM | POA: Diagnosis not present

## 2022-01-06 DIAGNOSIS — R079 Chest pain, unspecified: Secondary | ICD-10-CM | POA: Diagnosis not present

## 2022-01-06 DIAGNOSIS — R531 Weakness: Secondary | ICD-10-CM | POA: Diagnosis not present

## 2022-01-06 DIAGNOSIS — E860 Dehydration: Secondary | ICD-10-CM | POA: Insufficient documentation

## 2022-01-06 DIAGNOSIS — R0602 Shortness of breath: Secondary | ICD-10-CM | POA: Diagnosis not present

## 2022-01-06 DIAGNOSIS — R110 Nausea: Secondary | ICD-10-CM | POA: Diagnosis not present

## 2022-01-06 LAB — BASIC METABOLIC PANEL
Anion gap: 6 (ref 5–15)
BUN: 9 mg/dL (ref 8–23)
CO2: 24 mmol/L (ref 22–32)
Calcium: 9.4 mg/dL (ref 8.9–10.3)
Chloride: 110 mmol/L (ref 98–111)
Creatinine, Ser: 0.77 mg/dL (ref 0.44–1.00)
GFR, Estimated: >60 mL/min (ref >60)
Glucose, Bld: 108 mg/dL — ABNORMAL HIGH (ref 70–99)
Potassium: 4.1 mmol/L (ref 3.5–5.1)
Sodium: 140 mmol/L (ref 135–145)

## 2022-01-06 LAB — CBC
HCT: 40.7 % (ref 36.0–46.0)
Hemoglobin: 12.9 g/dL (ref 12.0–15.0)
MCH: 26.9 pg (ref 26.0–34.0)
MCHC: 31.7 g/dL (ref 30.0–36.0)
MCV: 84.8 fL (ref 80.0–100.0)
Platelets: 209 10*3/uL (ref 150–400)
RBC: 4.8 MIL/uL (ref 3.87–5.11)
RDW: 14.2 % (ref 11.5–15.5)
WBC: 6.3 10*3/uL (ref 4.0–10.5)
nRBC: 0 % (ref 0.0–0.2)

## 2022-01-06 LAB — TROPONIN I (HIGH SENSITIVITY)
Troponin I (High Sensitivity): 4 ng/L (ref <18)
Troponin I (High Sensitivity): 4 ng/L (ref <18)

## 2022-01-06 NOTE — ED Provider Triage Note (Signed)
Emergency Medicine Provider Triage Evaluation Note  Renee [name] , a 76 y.o. female  was evaluated in triage.  Pt complains of lightheadedness, dizziness and nausea.  Patient stated on Sunday showed episode of chest pain requiring EMS to come out.  Patient had EKG conducted at this time which was unremarkable.  Patient deferred EMS transport at this time.  Patient states that beginning today she woke up with lightheadedness, dizziness, nausea, "just not feeling right".  Patient denies any shortness of breath, chest pain or nausea or vomiting at this time.  Patient states that she is a "heart patient", has been diagnosed with CHF, SVT.  Review of Systems  Positive:  Negative:   Physical Exam  BP (!) 143/73   Pulse 71   Temp 98.6 F (37 C) (Oral)   Resp 18   Ht '5\' 3"'$  (1.6 m)   Wt 72.6 kg   SpO2 99%   BMI 28.34 kg/m  Gen:   Awake, no distress   Resp:  Normal effort  MSK:   Moves extremities without difficulty  Other:  Lung sounds clear.  No lower extremity swelling.  Medical Decision Making  Medically screening exam initiated at 11:21 AM.  Appropriate orders placed.  Renee [name] was informed that the remainder of the evaluation will be completed by another provider, this initial triage assessment does not replace that evaluation, and the importance of remaining in the ED until their evaluation is complete.     Azucena Cecil, PA-C 01/06/22 1122

## 2022-01-06 NOTE — ED Triage Notes (Signed)
Pt arrived POV from home c/o feeling light headed and weak. Pt states she is a heart pt and just does not feel right. Pt states she had an ambulance come out Saturday for CP but that has resolved but she does not feel like herself.

## 2022-01-06 NOTE — Discharge Instructions (Signed)
You were seen in the emergency department today for an episode of weakness while at work as well as an episode of chest pain that you had described you had this past Sunday.  Your lab work here today was very reassuring and did not show any signs of a heart attack or other acute abnormalities.  A bedside ultrasound showed overall normal function of your heart but did show that your IVC was slightly small, raising suspicion for some amount of dehydration.  We recommend continued p.o. hydration at home as well as rest.  We also recommend as we discussed following up with your cardiologist tomorrow at 6:30 AM at a previously scheduled appointment.

## 2022-01-06 NOTE — ED Provider Notes (Signed)
[facility] EMERGENCY DEPARTMENT Provider Note   CSN: [id number] Arrival date & time: 01/06/22  1041     History  Chief Complaint  Patient presents with   Weakness    Renee [name] is a 76 y.o. female.   Weakness  Patient is a 76 year old female with a history of AVNRT ablation, long QT syndrome, chronic chest pain syndrome who presents emergency department for evaluation of weakness.  Patient reports that this past Sunday she had an episode of chest pain while she was at rest.  She described a squeezing chest pain.  She had taken 3 nitroglycerin at home as well as a full dose aspirin and called EMS.  She was ultimately not transported to the hospital at that time.  Patient states that today she was at work in the office sitting down and started becoming lightheaded with some associated nausea without vomiting.  According to the patient she has been having intermittent episodes of the symptoms going on for some time.  Per old records patient was evaluated by her cardiologist Dr. Aundra Dubin on 07/28/2021.  According to the notes from that visit patient had been worked up extensively in the setting of these spells.  She had had a loop recorder placed.  She had a tilt test on 12/14 that was negative.  Patient has a Biotronik dual-chamber PPM.  Patient reports that her episode of lightheadedness today felt similar to previous episodes, however in the setting of her having had an episode of chest pain this past Sunday she came to the emergency department to be evaluated.  Patient currently denies any chest pain, shortness of breath, lightheadedness, palpitations or dizziness.  She states that she does have a follow-up appointment with her cardiologist tomorrow morning at 6:30 AM.     Home Medications Prior to Admission medications   Medication Sig Start Date End Date Taking? Authorizing Provider  ACCU-CHEK FASTCLIX LANCETS MISC USE UP TO 4 TIMES DAILY AS  DIRECTED 04/11/18    Biagio Borg, MD  ACCU-CHEK GUIDE test strip USE UP TO 4 TIMES DAILY AS  DIRECTED 04/11/18   Biagio Borg, MD  ALPRAZolam Duanne Moron) 0.5 MG tablet Take 0.5-1 tablets (0.25-0.5 mg total) by mouth 2 (two) times daily as needed for anxiety. 10/02/19   Biagio Borg, MD  aspirin EC 81 MG tablet Take 81 mg by mouth daily.    [provider]  blood glucose meter kit and supplies KIT Dispense per patient and insurance preferred. Use up to four times daily as directed. E11.9 02/21/18   Biagio Borg, MD  cetirizine (ZYRTEC) 10 MG tablet Take 10 mg by mouth daily as needed.    [provider]  dicyclomine (BENTYL) 10 MG capsule Take 10 mg by mouth 3 (three) times daily as needed (abdominal pain and crampiing).    [provider]  diltiazem (CARDIZEM CD) 240 MG 24 hr capsule Take 1 capsule (240 mg total) by mouth every morning. 11/04/21   Larey Dresser, MD  diltiazem (CARDIZEM) 30 MG tablet Take 2 tablets (60 mg total) by mouth 4 (four) times daily as needed (as needed for breakthrough heart racing/palpitations). 09/11/19   Evans Lance, MD  flecainide (TAMBOCOR) 50 MG tablet Take 1 tablet (50 mg total) by mouth 2 (two) times daily. 02/10/21   Evans Lance, MD  furosemide (LASIX) 20 MG tablet Take 20 mg by mouth daily as needed for fluid.    [provider]  isosorbide mononitrate (IMDUR) 30 MG 24 hr tablet Take 30 mg by mouth as needed. 09/20/21   [provider]  losartan (COZAAR) 50 MG tablet Take 1 tablet (50 mg total) by mouth 2 (two) times daily. 11/03/21   Larey Dresser, MD  nitroGLYCERIN (NITROSTAT) 0.4 MG SL tablet DISSOLVE 1 TABLET UNDER THE TONGUE EVERY 5 MINUTES AS  NEEDED FOR CHEST PAIN. MAX  OF 3 TABLETS IN 15 MINUTES. CALL 911 IF PAIN PERSISTS. 11/24/21   Larey Dresser, MD  omeprazole (PRILOSEC) 40 MG capsule TAKE 1 CAPSULE BY MOUTH IN  THE MORNING AND AT BEDTIME 11/15/21   Ladene Artist, MD  trimethoprim (TRIMPEX) 100 MG tablet Take 100 mg by  mouth daily as needed (For UTI per patient). 06/24/20   [provider]  zolpidem (AMBIEN) 5 MG tablet TAKE 1 TABLET BY MOUTH AT  BEDTIME AS NEEDED FOR SLEEP 12/22/20   Biagio Borg, MD      Allergies    Cyclobenzaprine, Azithromycin, Nitrofurantoin, Adhesive [tape], Moxifloxacin, and Quinolones    Review of Systems   Review of Systems  Neurological:  Positive for weakness.    Physical Exam Updated Vital Signs BP (!) 132/93   Pulse 89   Temp 98.3 F (36.8 C) (Oral)   Resp (!) 27   Ht 5' 3" (1.6 m)   Wt 72.6 kg   SpO2 98%   BMI 28.34 kg/m  Physical Exam Vitals and nursing note reviewed.  Constitutional:      General: She is not in acute distress.    Appearance: She is well-developed.  HENT:     Head: Atraumatic.     Right Ear: External ear normal.     Left Ear: External ear normal.     Nose: Nose normal.     Mouth/Throat:     Mouth: Mucous membranes are moist.     Pharynx: Oropharynx is clear.  Eyes:     Extraocular Movements: Extraocular movements intact.     Conjunctiva/sclera: Conjunctivae normal.     Pupils: Pupils are equal, round, and reactive to light.  Cardiovascular:     Rate and Rhythm: Normal rate and regular rhythm.     Pulses: Normal pulses.     Heart sounds: Normal heart sounds. No murmur heard. Pulmonary:     Effort: Pulmonary effort is normal. No respiratory distress.     Breath sounds: Normal breath sounds. No wheezing, rhonchi or rales.  Abdominal:     Palpations: Abdomen is soft.     Tenderness: There is no abdominal tenderness. There is no guarding or rebound.  Musculoskeletal:        General: No swelling or tenderness.     Cervical back: Neck supple.     Right lower leg: No edema.     Left lower leg: No edema.  Lymphadenopathy:     Cervical: No cervical adenopathy.  Skin:    General: Skin is warm and dry.     Capillary Refill: Capillary refill takes less than 2 seconds.     Findings: No rash.  Neurological:     General: No  focal deficit present.     Mental Status: She is alert and oriented to person, place, and time. Mental status is at baseline.  Psychiatric:        Mood and Affect: Mood normal.     ED Results / Procedures / Treatments   Labs (all labs ordered are listed, but only abnormal results are displayed) Labs  Reviewed  BASIC METABOLIC PANEL - Abnormal; Notable for the following components:      Result Value   Glucose, Bld 108 (*)    All other components within normal limits  CBC  TROPONIN I (HIGH SENSITIVITY)  TROPONIN I (HIGH SENSITIVITY)    EKG EKG Interpretation  Date/Time:  Thursday January 06 2022 15:53:36 EDT Ventricular Rate:  60 PR Interval:  170 QRS Duration: 95 QT Interval:  447 QTC Calculation: 447 R Axis:   29 Text Interpretation: Sinus rhythm Probable left atrial enlargement Borderline T abnormalities, anterior leads Confirmed by Nanda Quinton [phone]) on 01/06/2022 4:58:38 PM  Radiology DG Chest 1 View  Result Date: 01/06/2022 CLINICAL DATA:  Weakness, cough and shortness of breath. EXAM: CHEST  1 VIEW COMPARISON:  Plain film of the chest 03/31/2020. FINDINGS: 2 lead pacing device remains in place. Lungs are clear. Heart size is normal. No pneumothorax or pleural fluid. No acute or focal bony abnormality. Surgical clips upper abdomen noted. IMPRESSION: No acute disease. Electronically Signed   By: Inge Rise M.D.   On: 01/06/2022 11:44    Procedures Ultrasound ED Echo  Date/Time: 01/06/2022 6:55 PM  Performed by: Nelta Numbers, MD Authorized by: Margette Fast, MD   Procedure details:    Indications: syncope     Views: subxiphoid, parasternal long axis view, parasternal short axis view, apical 4 chamber view and IVC view     Images: not archived   Findings:    Pericardium: no pericardial effusion     LV Function: normal (>50% EF)     RV Diameter: normal     IVC: collapsed   Impression:    Impression: probable low CVP       Medications Ordered in  ED Medications - No data to display  ED Course/ Medical Decision Making/ A&P                           Medical Decision Making Problems Addressed: Dehydration: complicated acute illness or injury with systemic symptoms Weakness: complicated acute illness or injury with systemic symptoms that poses a threat to life or bodily functions  Amount and/or Complexity of Data Reviewed External Data Reviewed: labs, radiology, ECG and notes. Labs: ordered. Radiology: ordered. ECG/medicine tests: ordered.    Details: Rate 68.  QTc 469.  Evidence of Q waves seen in 2 3 aVF as well as V5 V6, consistent with prior EKGs.  PR interval and QTc within normal limits.  Sinus rhythm.  Evidence of paced rhythm.   Patient is a 76 year old female who presents emergency department as above.  On initial presentation patient's vital signs are stable and she is afebrile.  Patient is currently asymptomatic without any chest pain or shortness of breath.  Physical exam did not reveal any differences in her pulses, no heart sounds or abnormal lung sounds.  In the setting of the patient's cardiac history baseline laboratory work as well as imaging studies will be ordered.  Suspicion at this time is for dehydration versus unstable angina versus nonspecific chest wall pain versus electrolyte abnormality.  Thus troponin was negative at 4 and 4.  BMP within normal limits.  CBC without evidence of leukocytosis or anemia.  Chest x-ray without acute cardiopulmonary abnormalities.  EKG with no acute changes from prior.  Bedside ultrasound showed evidence of decreased size of IVC suggesting some component of dehydration, but overall grossly normal EF and no evidence of large pericardial effusion.  In  the circumstance of these findings the patient's weakness is not felt to be acutely cardiac in origin.  Likely some component of chronic cardiac issues secondary to dehydration.  Patient does have a follow-up appointment scheduled with her  cardiologist tomorrow morning.  She was instructed to follow-up with him for this appointment and discussed the findings from today's emergency department visit.  Plan for follow-up and findings were discussed with patient at bedside and she verbalized understanding was agreeable.  Patient discharged in the ED in stable condition.         Final Clinical Impression(s) / ED Diagnoses Final diagnoses:  Weakness  Dehydration    Rx / DC Orders ED Discharge Orders     None         Nelta Numbers, MD 01/07/22 3704    Margette Fast, MD 01/09/22 [phone]

## 2022-01-06 NOTE — ED Provider Triage Note (Deleted)
Emergency Medicine Provider Triage Evaluation Note  ISHIKA CHESTERFIELD , a 76 y.o. female  was evaluated in triage.  Pt complains of lightheadedness, weakness and chest pain since 930.  Patient reports she was at the children's resume with her grandson when the symptoms began all of a sudden.  Patient states that when she woke up this morning she felt fine.  Patient states she has a history of chest pain which has been attributed to GERD in the past.  Patient states the chest pain is centralized, nonradiating, not worse with exertion.  Patient states that she is not short of breath, denies nausea or vomiting.  The patient denies any unilateral weakness or numbness, word finding issues.  Review of Systems  Positive:  Negative:   Physical Exam  BP (!) 143/73   Pulse 71   Temp 98.6 F (37 C) (Oral)   Resp 18   Ht '5\' 3"'$  (1.6 m)   Wt 72.6 kg   SpO2 99%   BMI 28.34 kg/m  Gen:   Awake, no distress   Resp:  Normal effort  MSK:   Moves extremities without difficulty  Other:  Lung sounds clear bilaterally.  The patient neurological examination shows no focal neurodeficits.  Medical Decision Making  Medically screening exam initiated at 11:10 AM.  Appropriate orders placed.  JOHNNAE IMPASTATO was informed that the remainder of the evaluation will be completed by another provider, this initial triage assessment does not replace that evaluation, and the importance of remaining in the ED until their evaluation is complete.  Work-up initiated   Azucena Cecil, [place] 01/06/22 1111

## 2022-01-07 ENCOUNTER — Ambulatory Visit (HOSPITAL_COMMUNITY)
Admission: RE | Admit: 2022-01-07 | Discharge: 2022-01-07 | Disposition: A | Discharge status: Home / Self Care | Payer: Medicare Other | Source: Ambulatory Visit | Attending: Cardiology | Admitting: Cardiology

## 2022-01-07 ENCOUNTER — Encounter (HOSPITAL_COMMUNITY): Payer: Self-pay | Admitting: Cardiology

## 2022-01-07 VITALS — BP 140/80 | HR 69 | Wt 164.0 lb

## 2022-01-07 DIAGNOSIS — R0789 Other chest pain: Secondary | ICD-10-CM | POA: Diagnosis not present

## 2022-01-07 DIAGNOSIS — I471 Supraventricular tachycardia: Secondary | ICD-10-CM | POA: Insufficient documentation

## 2022-01-07 DIAGNOSIS — I4581 Long QT syndrome: Secondary | ICD-10-CM | POA: Insufficient documentation

## 2022-01-07 DIAGNOSIS — Z79899 Other long term (current) drug therapy: Secondary | ICD-10-CM | POA: Insufficient documentation

## 2022-01-07 DIAGNOSIS — R55 Syncope and collapse: Secondary | ICD-10-CM | POA: Diagnosis not present

## 2022-01-07 DIAGNOSIS — R0609 Other forms of dyspnea: Secondary | ICD-10-CM | POA: Diagnosis not present

## 2022-01-07 DIAGNOSIS — G8929 Other chronic pain: Secondary | ICD-10-CM | POA: Diagnosis not present

## 2022-01-07 DIAGNOSIS — I110 Hypertensive heart disease with heart failure: Secondary | ICD-10-CM | POA: Insufficient documentation

## 2022-01-07 DIAGNOSIS — I480 Paroxysmal atrial fibrillation: Secondary | ICD-10-CM | POA: Insufficient documentation

## 2022-01-07 DIAGNOSIS — I5032 Chronic diastolic (congestive) heart failure: Secondary | ICD-10-CM

## 2022-01-07 DIAGNOSIS — R079 Chest pain, unspecified: Secondary | ICD-10-CM | POA: Diagnosis not present

## 2022-01-07 DIAGNOSIS — R002 Palpitations: Secondary | ICD-10-CM | POA: Diagnosis not present

## 2022-01-07 NOTE — Patient Instructions (Signed)
No changes to your medications.  Your physician has requested that you have cardiac CT. Cardiac computed tomography (CT) is a painless test that uses an x-ray machine to take clear, detailed pictures of your heart. For further information please visit [email or web address]. Please follow instruction sheet as given. ONCE APPROVED BY YOUR INSURANCE YOU WILL BE CALLED TO SCHEDULE THE EXAM.  You have been ordered a PYP Scan.  This is done in the Radiology Department of [facility].  When you come for this test please plan to be there 2-3 hours. ONCE APPROVED BY YOUR INSURANCE YOU WILL BE CALLED TO SCHEDULE THE EXAM  Your physician recommends that you schedule a follow-up appointment in: 6 months (January 2024) if all scan are clear.  If you have any questions or concerns before your next appointment please send [place] a message through [place] or call our office at [phone].    TO LEAVE A MESSAGE FOR THE NURSE SELECT OPTION 2, PLEASE LEAVE A MESSAGE INCLUDING: YOUR NAME DATE OF BIRTH CALL BACK NUMBER REASON FOR CALL**this is important as we prioritize the call backs  YOU WILL RECEIVE A CALL BACK THE SAME DAY AS LONG AS YOU CALL BEFORE 4:00 PM  At the [facility], you and your health needs are our priority. As part of our continuing mission to provide you with exceptional heart care, we have created designated Provider Care Teams. These Care Teams include your primary Cardiologist (physician) and Advanced Practice Providers (APPs- Physician Assistants and Nurse Practitioners) who all work together to provide you with the care you need, when you need it.   You may see any of the following providers on your designated Care Team at your next follow up: Dr Glori Bickers Dr Haynes Kerns, NP Lyda Jester, [place] [facility] [place], [place] Audry Riles, PharmD   Please be sure to bring in all your medications bottles to every appointment.
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
## 2022-01-17 ENCOUNTER — Ambulatory Visit (HOSPITAL_COMMUNITY)
Admission: RE | Admit: 2022-01-17 | Discharge: 2022-01-17 | Disposition: A | Discharge status: Home / Self Care | Payer: Medicare Other | Source: Ambulatory Visit | Attending: Cardiology | Admitting: Cardiology

## 2022-01-17 DIAGNOSIS — I5032 Chronic diastolic (congestive) heart failure: Secondary | ICD-10-CM | POA: Diagnosis not present

## 2022-01-18 ENCOUNTER — Encounter (HOSPITAL_COMMUNITY): Payer: Medicare Other | Admitting: Cardiology

## 2022-01-18 LAB — ECHOCARDIOGRAM COMPLETE
AR max vel: 2.31 cm2
AV Area VTI: 2.38 cm2
AV Area mean vel: 2.25 cm2
AV Mean grad: 3 mmHg
AV Peak grad: 5.9 mmHg
Ao pk vel: 1.21 m/s
Area-P 1/2: 3.48 cm2
S' Lateral: 2.9 cm

## 2022-01-20 ENCOUNTER — Encounter (HOSPITAL_COMMUNITY): Payer: Self-pay

## 2022-01-20 ENCOUNTER — Other Ambulatory Visit (HOSPITAL_COMMUNITY): Payer: Self-pay

## 2022-01-20 DIAGNOSIS — R079 Chest pain, unspecified: Secondary | ICD-10-CM

## 2022-01-20 NOTE — Progress Notes (Unsigned)
errotr

## 2022-01-27 ENCOUNTER — Encounter (HOSPITAL_COMMUNITY): Payer: Self-pay | Admitting: Cardiology

## 2022-01-31 ENCOUNTER — Encounter (HOSPITAL_COMMUNITY): Payer: Self-pay | Admitting: Cardiology

## 2022-02-03 ENCOUNTER — Telehealth (HOSPITAL_COMMUNITY): Payer: Self-pay | Admitting: *Deleted

## 2022-02-03 NOTE — Telephone Encounter (Signed)
Pyp auth request in review

## 2022-02-04 ENCOUNTER — Encounter: Payer: Self-pay | Admitting: Gastroenterology

## 2022-02-07 ENCOUNTER — Ambulatory Visit (INDEPENDENT_AMBULATORY_CARE_PROVIDER_SITE_OTHER): Payer: Medicare Other

## 2022-02-07 DIAGNOSIS — I495 Sick sinus syndrome: Secondary | ICD-10-CM

## 2022-02-08 LAB — CUP PACEART REMOTE DEVICE CHECK
Date Time Interrogation Session: 20230829074233
Implantable Lead Implant Date: 20170921
Implantable Lead Implant Date: 20170921
Implantable Lead Location: 753859
Implantable Lead Location: 753860
Implantable Lead Model: 377
Implantable Lead Model: 377
Implantable Lead Serial Number: 49575519
Implantable Lead Serial Number: 49580836
Implantable Pulse Generator Implant Date: 20170921
Pulse Gen Model: 407145
Pulse Gen Serial Number: 68850891

## 2022-02-09 ENCOUNTER — Telehealth (HOSPITAL_COMMUNITY): Payer: Self-pay | Admitting: *Deleted

## 2022-02-09 IMAGING — DX DG CHEST 2V
2 series · 2 of 2 positions shown · non-contrast
Comparison: 03/14/2019

CLINICAL DATA: Cough and chest pain for several days

EXAM:
CHEST - 2 VIEW

[chest pa]
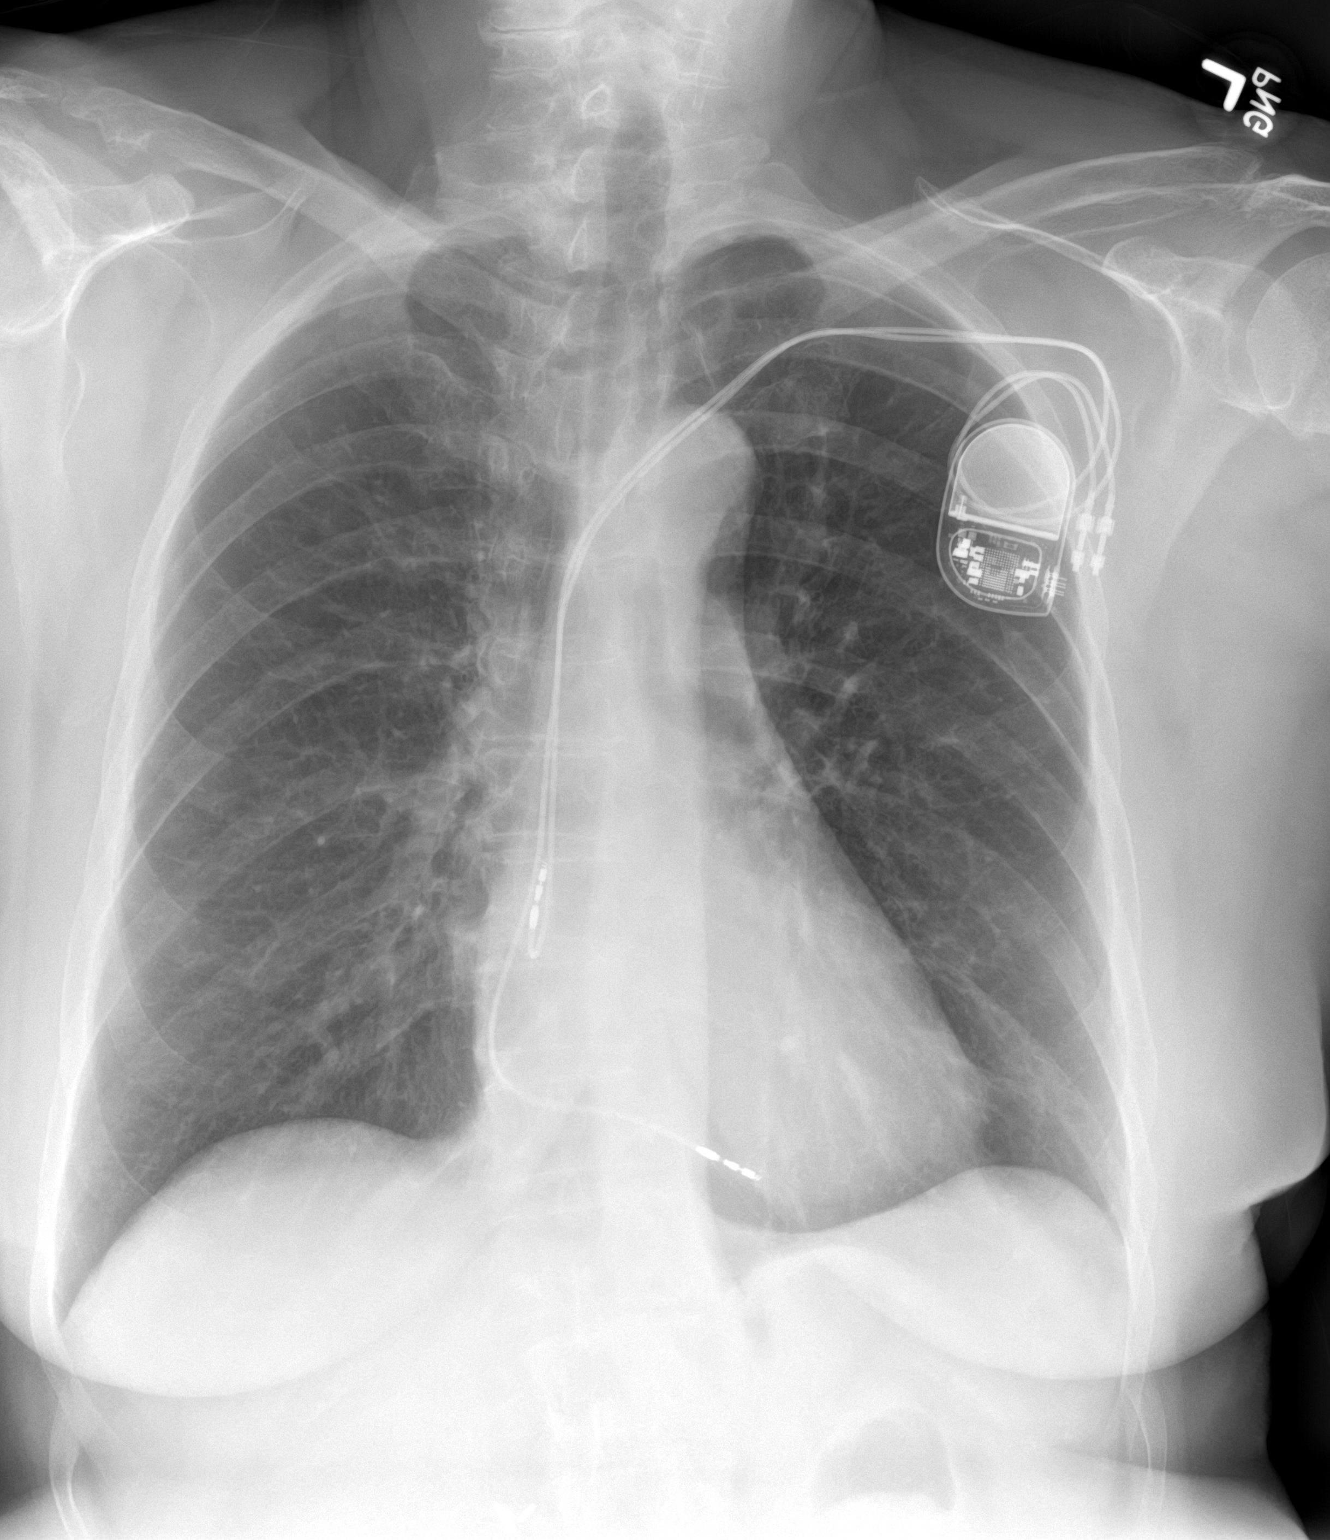

[chest lat]
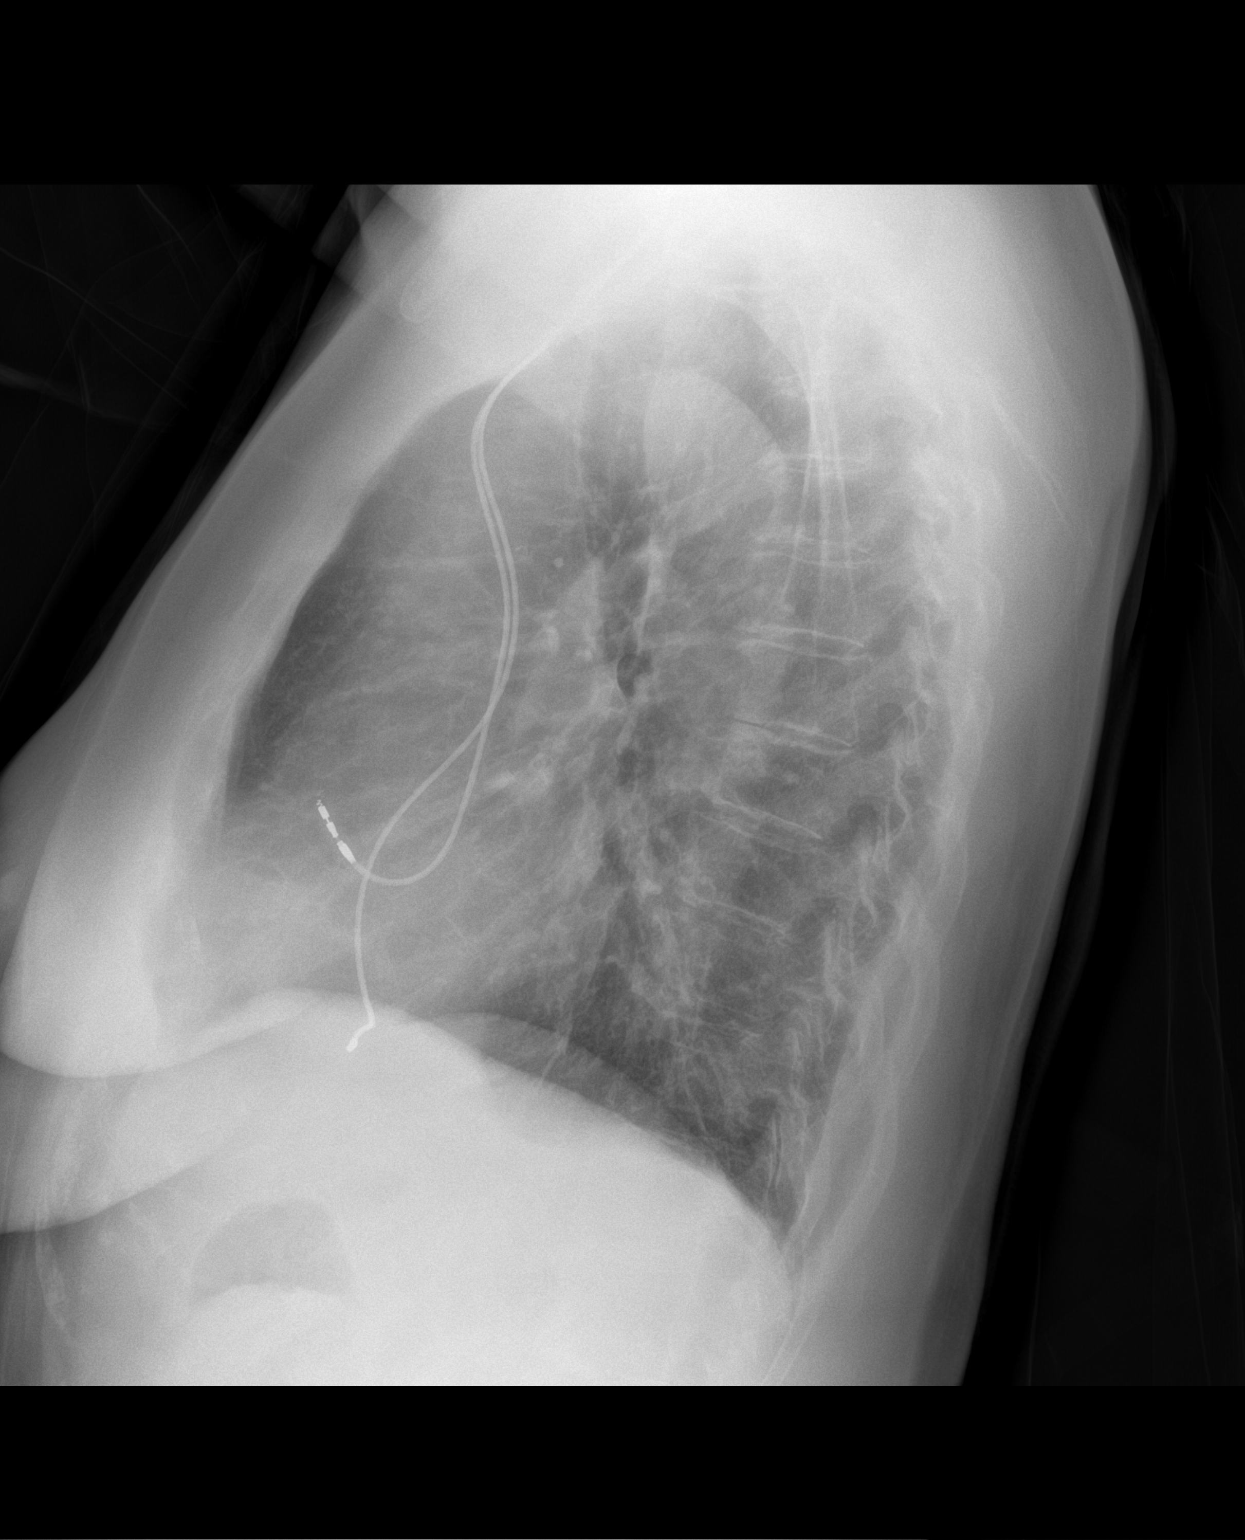

[2 of 2 positions shown; findings below may reference images not displayed]

FINDINGS: Cardiac shadow is within normal limits. Pacing device is again seen.
The lungs are well aerated bilaterally. No focal infiltrate or
effusion is seen. No bony abnormality is noted.
IMPRESSION: No acute abnormality noted.

## 2022-02-09 NOTE — Telephone Encounter (Signed)
PYP Josem Kaufmann approved   Auth # [id number]  Expires 03/24/22  Given to Dionne Milo to schedule

## 2022-02-09 NOTE — Telephone Encounter (Signed)
Reaching out to patient to offer assistance regarding upcoming cardiac imaging study; pt verbalizes understanding of appt date/time, parking situation and where to check in, pre-test NPO status and verified current allergies; name and call back number provided for further questions should they arise  Madysun Thall RN Navigator Cardiac Imaging [facility] [phone] office [phone] cell  Patient aware to arrive at 2:30pm 

## 2022-02-10 ENCOUNTER — Emergency Department (HOSPITAL_COMMUNITY)
Admit: 2022-02-10 | Discharge: 2022-02-10 | Disposition: A | Discharge status: Home / Self Care | Payer: Medicare Other | Attending: Cardiology | Admitting: Cardiology

## 2022-02-10 ENCOUNTER — Emergency Department (HOSPITAL_COMMUNITY): Payer: Medicare Other

## 2022-02-10 ENCOUNTER — Encounter (HOSPITAL_COMMUNITY): Payer: Self-pay

## 2022-02-10 ENCOUNTER — Other Ambulatory Visit: Payer: Self-pay

## 2022-02-10 ENCOUNTER — Ambulatory Visit (HOSPITAL_COMMUNITY): Admission: RE | Admit: 2022-02-10 | Payer: Medicare Other | Source: Ambulatory Visit

## 2022-02-10 ENCOUNTER — Emergency Department (HOSPITAL_COMMUNITY)
Admission: EM | Admit: 2022-02-10 | Discharge: 2022-02-10 | Disposition: A | Discharge status: Home / Self Care | Payer: Medicare Other | Attending: Emergency Medicine | Admitting: Emergency Medicine

## 2022-02-10 DIAGNOSIS — R9431 Abnormal electrocardiogram [ECG] [EKG]: Secondary | ICD-10-CM | POA: Diagnosis not present

## 2022-02-10 DIAGNOSIS — F419 Anxiety disorder, unspecified: Secondary | ICD-10-CM | POA: Insufficient documentation

## 2022-02-10 DIAGNOSIS — Z7982 Long term (current) use of aspirin: Secondary | ICD-10-CM | POA: Diagnosis not present

## 2022-02-10 DIAGNOSIS — R079 Chest pain, unspecified: Secondary | ICD-10-CM

## 2022-02-10 DIAGNOSIS — R072 Precordial pain: Secondary | ICD-10-CM | POA: Insufficient documentation

## 2022-02-10 DIAGNOSIS — R0789 Other chest pain: Secondary | ICD-10-CM | POA: Diagnosis not present

## 2022-02-10 LAB — BASIC METABOLIC PANEL
Anion gap: 8 (ref 5–15)
BUN: 12 mg/dL (ref 8–23)
CO2: 22 mmol/L (ref 22–32)
Calcium: 9.5 mg/dL (ref 8.9–10.3)
Chloride: 109 mmol/L (ref 98–111)
Creatinine, Ser: 0.92 mg/dL (ref 0.44–1.00)
GFR, Estimated: >60 mL/min (ref >60)
Glucose, Bld: 145 mg/dL — ABNORMAL HIGH (ref 70–99)
Potassium: 4.1 mmol/L (ref 3.5–5.1)
Sodium: 139 mmol/L (ref 135–145)

## 2022-02-10 LAB — CBC
HCT: 41.5 % (ref 36.0–46.0)
Hemoglobin: 13.6 g/dL (ref 12.0–15.0)
MCH: 27.3 pg (ref 26.0–34.0)
MCHC: 32.8 g/dL (ref 30.0–36.0)
MCV: 83.3 fL (ref 80.0–100.0)
Platelets: 263 10*3/uL (ref 150–400)
RBC: 4.98 MIL/uL (ref 3.87–5.11)
RDW: 14.1 % (ref 11.5–15.5)
WBC: 7.1 10*3/uL (ref 4.0–10.5)
nRBC: 0 % (ref 0.0–0.2)

## 2022-02-10 LAB — TROPONIN I (HIGH SENSITIVITY)
Troponin I (High Sensitivity): 4 ng/L (ref <18)
Troponin I (High Sensitivity): 5 ng/L (ref <18)

## 2022-02-10 MED ORDER — NITROGLYCERIN 0.4 MG SL SUBL
0.8000 mg | SUBLINGUAL_TABLET | Freq: Once | SUBLINGUAL | Status: AC
  Administered 2022-02-10: 0.8 mg via SUBLINGUAL

## 2022-02-10 MED ORDER — NITROGLYCERIN 0.4 MG SL SUBL: 0.4000 mg | SUBLINGUAL_TABLET | SUBLINGUAL | Status: DC | PRN

## 2022-02-10 MED ORDER — IOHEXOL 350 MG/ML SOLN
100.0000 mL | Freq: Once | INTRAVENOUS | Status: AC | PRN
  Administered 2022-02-10: 100 mL via INTRAVENOUS

## 2022-02-10 MED ORDER — NITROGLYCERIN 0.4 MG SL SUBL
SUBLINGUAL_TABLET | SUBLINGUAL | Status: AC
  Filled 2022-02-10: qty 2

## 2022-02-10 NOTE — ED Notes (Signed)
[employer] rep called and stated 12mn ETA.

## 2022-02-10 NOTE — Discharge Instructions (Addendum)
You were seen today for chest pain.  Your objective evaluation does not reveal any emergent signs of cardiac disease.  You do have a significant history and therefore it is critical that you continue working with your cardiologist in the outpatient setting.  Based on your ongoing fatigue in the outpatient setting, he would likely benefit from a cardiac rehab program as we discussed.  There are multiple in the area, that you may pursue. Thank you for the opportunity to participate in your care, please return with any changes in your symptoms: Tretha Sciara MD

## 2022-02-10 NOTE — ED Notes (Signed)
[employer] team notified and advised patient was on the list and typically respond within 3 hours.

## 2022-02-10 NOTE — ED Provider Notes (Signed)
[facility] EMERGENCY DEPARTMENT Provider Note   CSN: [id number] Arrival date & time: 02/10/22  1130     History  Chief Complaint  Patient presents with   Chest Pain    Renee [name] is a 76 y.o. female.  HPI 76 year old female history of SVT, symptomatic pauses status post maker placement, presents today with episode of chest pain.  She describes severe left to substernal chest pain that radiated up into her face.  She reports that she took a nitroglycerin and pain decreased from 8 to a 4 out of 5.  She is having some ongoing pressure and discomfort in her chest.  She reports it was she was to have an additional cardiac study this afternoon but came to the emergency department instead.  Review of electronic medical records show that she had a CT coronary artery scheduled today at 3 PM and CT angio of the chest with a nuclear cardiac amyloid tumor ordered.  Patient has extensive prior history with multiple prior studies done. Echocardiogram from January 17, 2022 reviewed with EF 55 to 60% normal left ventricular function, right ventricular size is normal with mildly increased right ventricular wall thickness, normal pulmonary artery Solik blood pressure, tricuspid regurg velocity at 2.46 m/s at atrium and right atrium normal, trivial pericardial effusion, normal mitral valve, normal tricuspid valve structure.  Aortic valve is tricuspid with mild thickening of the aortic valve no aortic valve regurgitation is visualized no stenosis is noted.     Home Medications Prior to Admission medications   Medication Sig Start Date End Date Taking? Authorizing Provider  ACCU-CHEK FASTCLIX LANCETS MISC USE UP TO 4 TIMES DAILY AS  DIRECTED 04/11/18   Biagio Borg, MD  ACCU-CHEK GUIDE test strip USE UP TO 4 TIMES DAILY AS  DIRECTED 04/11/18   Biagio Borg, MD  ALPRAZolam Duanne Moron) 0.5 MG tablet Take 0.5-1 tablets (0.25-0.5 mg total) by mouth 2 (two) times daily as needed for anxiety.  10/02/19   Biagio Borg, MD  aspirin EC 81 MG tablet Take 81 mg by mouth daily.    [provider]  blood glucose meter kit and supplies KIT Dispense per patient and insurance preferred. Use up to four times daily as directed. E11.9 02/21/18   Biagio Borg, MD  cetirizine (ZYRTEC) 10 MG tablet Take 10 mg by mouth daily as needed.    [provider]  diltiazem (CARDIZEM CD) 240 MG 24 hr capsule Take 1 capsule (240 mg total) by mouth every morning. 11/04/21   Larey Dresser, MD  diltiazem (CARDIZEM) 30 MG tablet Take 2 tablets (60 mg total) by mouth 4 (four) times daily as needed (as needed for breakthrough heart racing/palpitations). 09/11/19   Evans Lance, MD  flecainide (TAMBOCOR) 50 MG tablet Take 1 tablet (50 mg total) by mouth 2 (two) times daily. 02/10/21   Evans Lance, MD  furosemide (LASIX) 20 MG tablet Take 20 mg by mouth daily as needed for fluid.    [provider]  isosorbide mononitrate (IMDUR) 30 MG 24 hr tablet Take 30 mg by mouth as needed. 09/20/21   [provider]  losartan (COZAAR) 50 MG tablet Take 1 tablet (50 mg total) by mouth 2 (two) times daily. 11/03/21   Larey Dresser, MD  nitroGLYCERIN (NITROSTAT) 0.4 MG SL tablet DISSOLVE 1 TABLET UNDER THE TONGUE EVERY 5 MINUTES AS  NEEDED FOR CHEST PAIN. MAX  OF 3 TABLETS IN 15 MINUTES. CALL 911  IF PAIN PERSISTS. 11/24/21   Larey Dresser, MD  omeprazole (PRILOSEC) 40 MG capsule TAKE 1 CAPSULE BY MOUTH IN  THE MORNING AND AT BEDTIME 11/15/21   Ladene Artist, MD  trimethoprim (TRIMPEX) 100 MG tablet Take 100 mg by mouth daily as needed (For UTI per patient). 06/24/20   [provider]  zolpidem (AMBIEN) 5 MG tablet TAKE 1 TABLET BY MOUTH AT  BEDTIME AS NEEDED FOR SLEEP 12/22/20   Biagio Borg, MD      Allergies    Cyclobenzaprine, Azithromycin, Nitrofurantoin, Adhesive [tape], Moxifloxacin, and Quinolones    Review of Systems   Review of Systems  Physical Exam Updated Vital  Signs BP 127/78   Pulse 61   Temp 98.3 F (36.8 C) (Oral)   Resp (!) 22   Ht 1.6 m ($Remo'5\' 3"'VcVgI$ )   Wt 74.4 kg   SpO2 98%   BMI 29.05 kg/m  Physical Exam Vitals and nursing note reviewed.  Constitutional:      Appearance: She is well-developed.  HENT:     Head: Normocephalic and atraumatic.  Eyes:     Extraocular Movements: Extraocular movements intact.     Pupils: Pupils are equal, round, and reactive to light.  Cardiovascular:     Rate and Rhythm: Normal rate.     Heart sounds: Normal heart sounds.  Pulmonary:     Effort: Pulmonary effort is normal.     Breath sounds: Normal breath sounds.  Chest:     Chest wall: No mass or tenderness.     Comments: No warmth or tenderness noted over left chest Abdominal:     General: Bowel sounds are normal.     Palpations: Abdomen is soft.  Musculoskeletal:        General: Normal range of motion.     Cervical back: Normal range of motion.     Right lower leg: No edema.     Left lower leg: No edema.  Skin:    General: Skin is warm and dry.     Capillary Refill: Capillary refill takes less than 2 seconds.  Neurological:     General: No focal deficit present.     Mental Status: She is alert.  Psychiatric:        Mood and Affect: Mood is anxious.     ED Results / Procedures / Treatments   Labs (all labs ordered are listed, but only abnormal results are displayed) Labs Reviewed  BASIC METABOLIC PANEL - Abnormal; Notable for the following components:      Result Value   Glucose, Bld 145 (*)    All other components within normal limits  CBC  TROPONIN I (HIGH SENSITIVITY)  TROPONIN I (HIGH SENSITIVITY)    EKG EKG Interpretation  Date/Time:  Thursday February 10 2022 11:33:19 EDT Ventricular Rate:  67 PR Interval:  184 QRS Duration: 84 QT Interval:  424 QTC Calculation: 448 R Axis:   45 Text Interpretation: Normal sinus rhythm Low voltage QRS Possible Inferior infarct , age undetermined Abnormal ECG When compared with ECG of  06-Jan-2022 17:59, No significant change since last tracing Confirmed by Pattricia Boss [phone]) on 02/10/2022 4:13:19 PM  Radiology CT CORONARY MORPH W/CTA COR W/SCORE W/CA W/CM &/OR WO/CM  Result Date: 02/10/2022 EXAM: OVER-READ INTERPRETATION  CT CHEST The following report is a limited chest CT over-read performed by radiologist Dr. Suzy Bouchard of [facility] on 02/10/2022. The CTA interpretation by the cardiologist is attached. COMPARISON:  None Available. FINDINGS:  Limited view of the lung parenchyma demonstrates no suspicious nodularity. Airways are normal. Limited view of the mediastinum demonstrates no adenopathy. Esophagus normal. Limited view of the upper abdomen unremarkable. Limited view of the skeleton and chest wall is unremarkable. IMPRESSION: No significant extracardiac findings. Electronically Signed   By: Suzy Bouchard M.D.   On: 02/10/2022 15:40   DG Chest 2 View  Result Date: 02/10/2022 CLINICAL DATA:  Chest pain EXAM: CHEST - 2 VIEW COMPARISON:  01/06/2022 FINDINGS: Cardiomediastinal silhouette and pulmonary vasculature are within normal limits. Lungs are clear. Dual lead left chest wall pacemaker unchanged in configuration. IMPRESSION: No acute cardiopulmonary process. Electronically Signed   By: Miachel Roux M.D.   On: 02/10/2022 12:19    Procedures Procedures    Medications Ordered in ED Medications  nitroGLYCERIN (NITROSTAT) SL tablet 0.4 mg (has no administration in time range)  nitroGLYCERIN (NITROSTAT) 0.4 MG SL tablet (has no administration in time range)    ED Course/ Medical Decision Making/ A&P Clinical Course as of 02/10/22 1625  Thu Feb 10, 2022  1621 Stable 59 YOF with a ccx of chest pain. Acute on chronic. More severe today. Got nitro with improvement. Down to 4. Was supposed to get a cards calcium study today. Cards consulted.  [CC]    Clinical Course User Index [CC] Tretha Sciara, MD                           Medical Decision  Making 76 year old lady history of SVT, status post pacemaker, presents today with chest pain.  Pain was left-sided chest and severe improved with nitroglycerin Here EKG was nonacute.  First troponin is negative Patient was scheduled for CT today and is having while here in the ED Patient has remained stable here in the ED Differential diagnosis includes acute coronary syndrome, causes of chest pain such as chest wall pain, PE, pneumonia or other intrathoracic etiology such as diseases of the great vessels or infection Chest x-Casidee Jann is clear and is not suspicious for dissection or infection Patient is not dyspneic and has normal height heart rate, I have a low index of suspicion for PE Patient is followed by cardiology.  I discussed care with with cardiology.  Dr. Acie Fredrickson has messaged me that he will see her and evaluate Care discussed with Dr. Oswald Hillock Disposition per cardiology  Amount and/or Complexity of Data Reviewed External Data Reviewed: ECG. Labs: ordered. Decision-making details documented in ED Course. Radiology: ordered and independent interpretation performed. Decision-making details documented in ED Course. ECG/medicine tests: ordered and independent interpretation performed. Decision-making details documented in ED Course.  Risk Prescription drug management.          Final Clinical Impression(s) / ED Diagnoses Final diagnoses:  Chest pain, unspecified type    Rx / DC Orders ED Discharge Orders     None         Pattricia Boss, MD 02/10/22 1625

## 2022-02-10 NOTE — ED Triage Notes (Signed)
Patient complains of chest pain that started this morning and went into left side of face described as hot.  Patient took 1 nitro at home.  Reports it relieved pain. Patient defensive in triage when asking questions states "I need to know why this happened".  Patient had a test scheduled with cardiology today but came to ER.  Patient cards MD is McClain.
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
## 2022-02-10 NOTE — ED Notes (Signed)
Patient has pacemaker by [employer]. [employer] called to come and interrogate @ 1332.

## 2022-02-11 IMAGING — CT CT ABD-PELV W/ CM
2 of 5 series · 16 of 46 positions shown, 18 images · IV contrast (OMNIPAQUE 300)
Comparison: 10/09/2016 from [HOSPITAL]

CLINICAL DATA: Diffuse abdominal pain and severe nausea for 5 days.

EXAM:
CT ABDOMEN AND PELVIS WITH CONTRAST
TECHNIQUE: Multidetector CT imaging of the abdomen and pelvis was performed
using the standard protocol following bolus administration of
intravenous contrast.
CONTRAST:  100mL OMNIPAQUE IOHEXOL 300 MG/ML  SOLN

[Series 2: abd/pel w · axial · 0.65mm/px · z∈[-464,-58]mm · 13 of 91 slices shown, 15 images]
[im 5/91  soft-tissue]
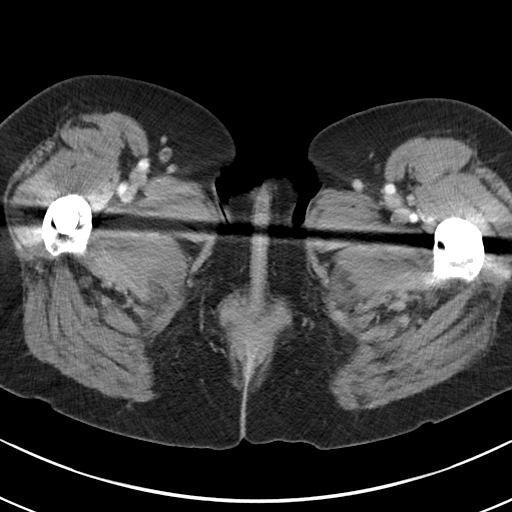
[im 5/91  bone]
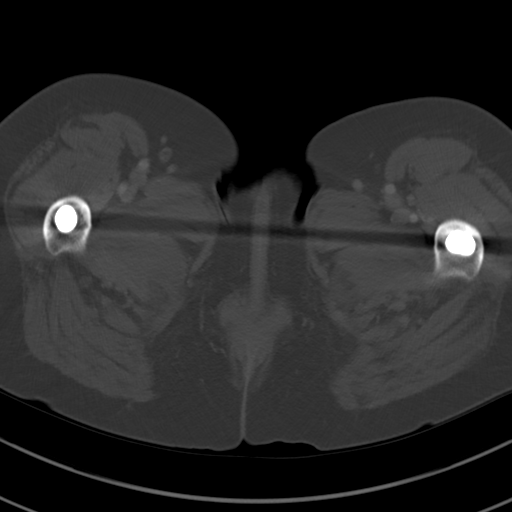
[im 14/91  soft-tissue]
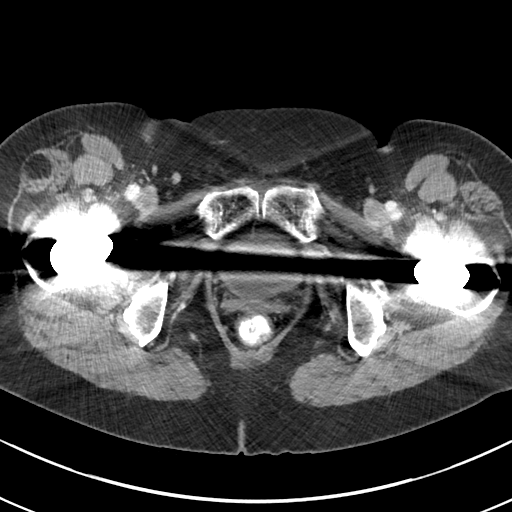
[im 19/91  soft-tissue]
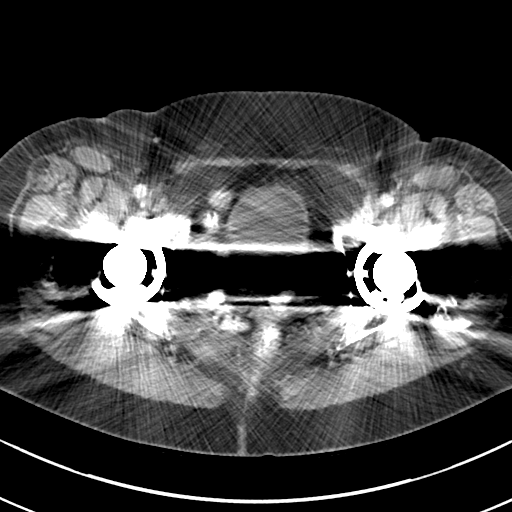
[im 28/91  soft-tissue]
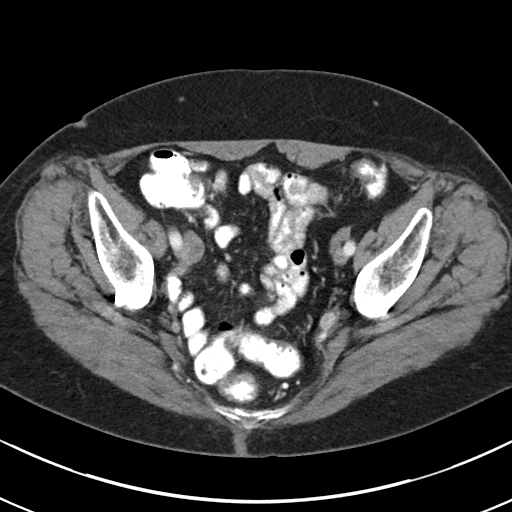
[im 32/91  soft-tissue]
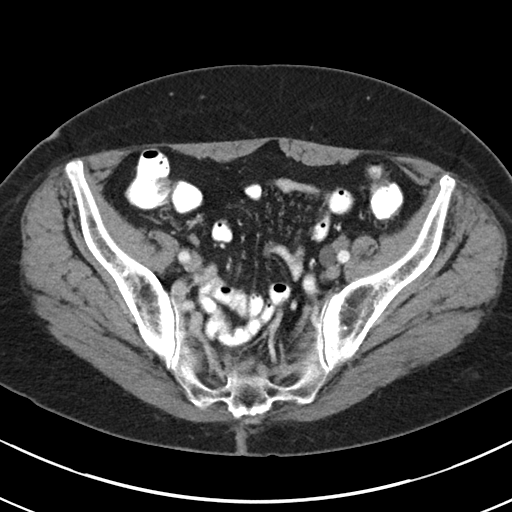
[im 41/91  soft-tissue]
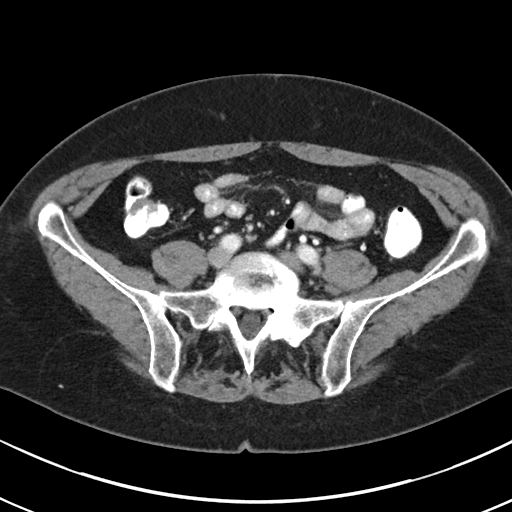
[im 46/91  soft-tissue]
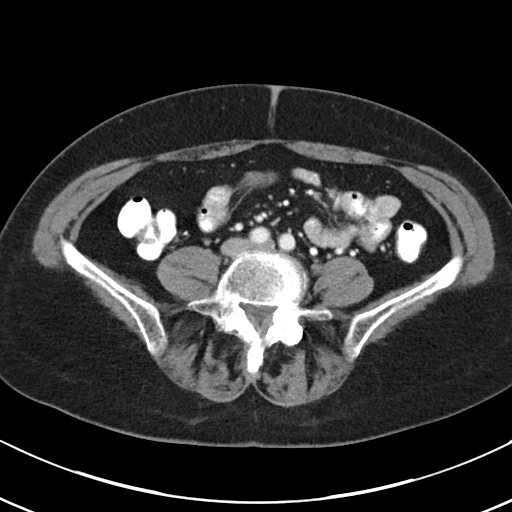
[im 50/91  soft-tissue]
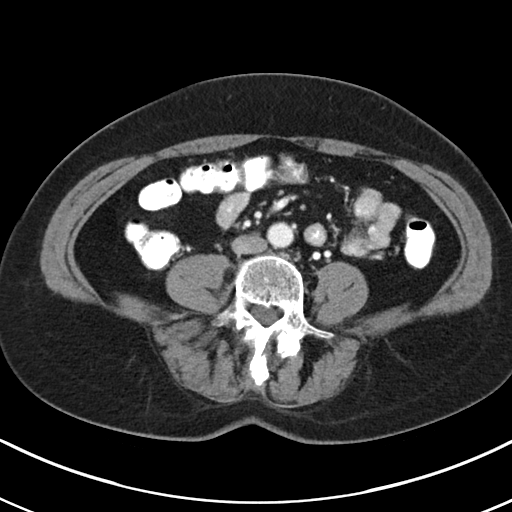
[im 59/91  soft-tissue]
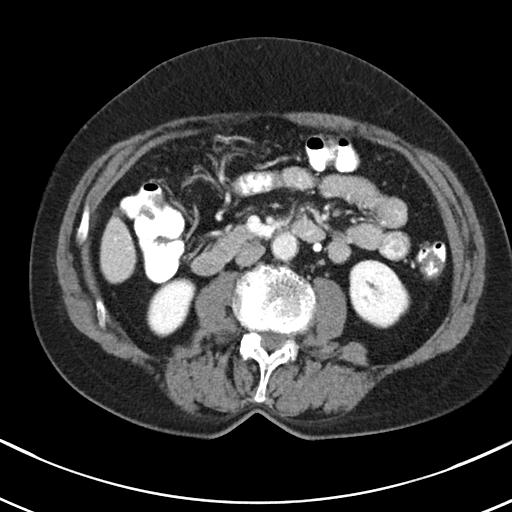
[im 59/91  bone]
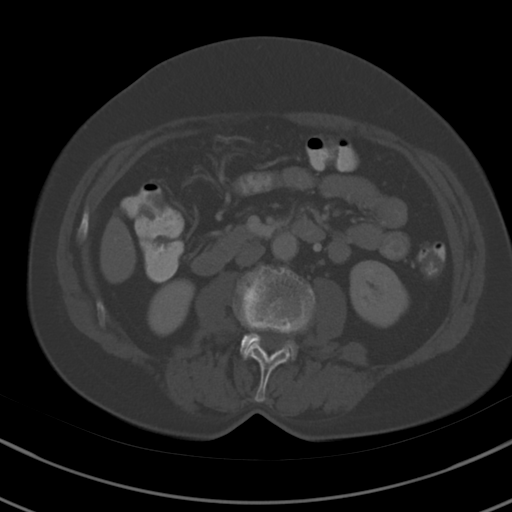
[im 64/91  soft-tissue]
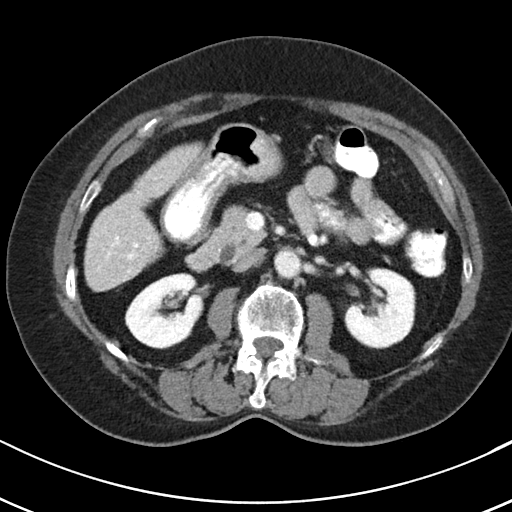
[im 73/91  soft-tissue]
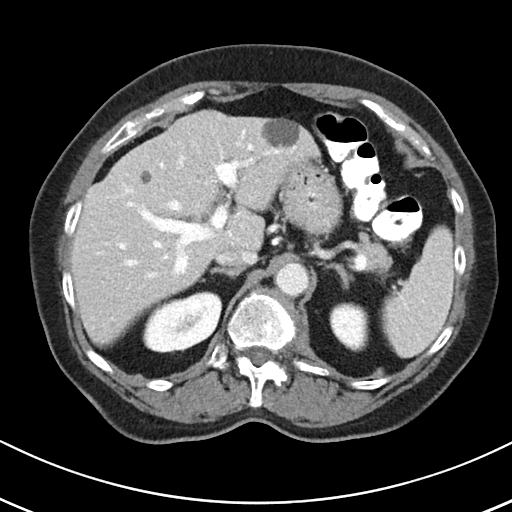
[im 77/91  soft-tissue]
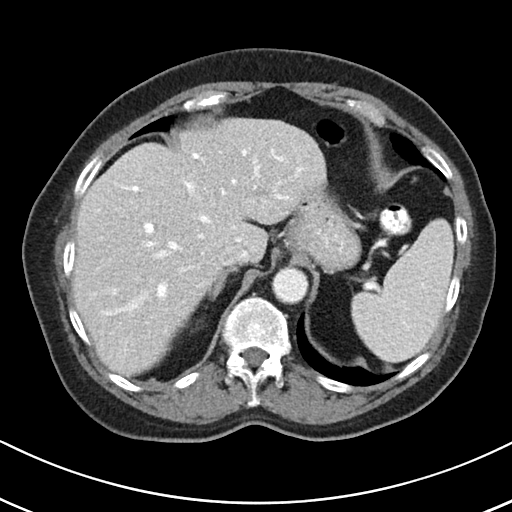
[im 86/91  soft-tissue]
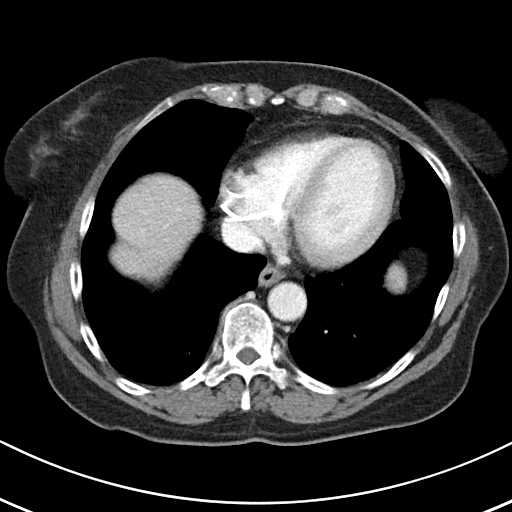

[Series 5: abd/pel w st · coronal · 0.69mm/px · 3 of 76 slices shown]
[im 26/76  soft-tissue]
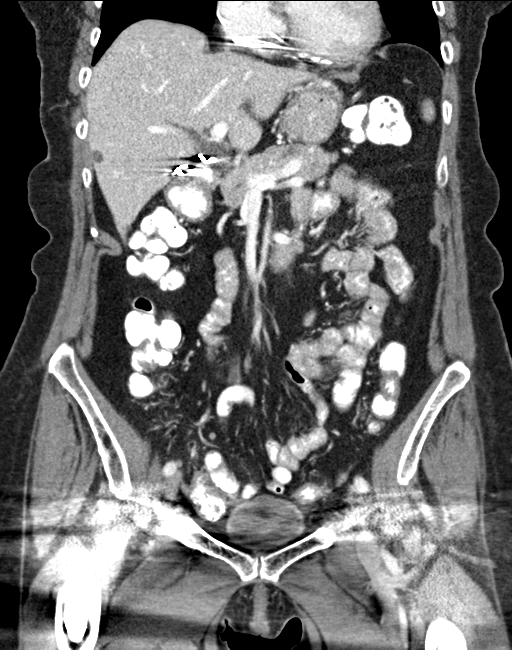
[im 34/76  soft-tissue]
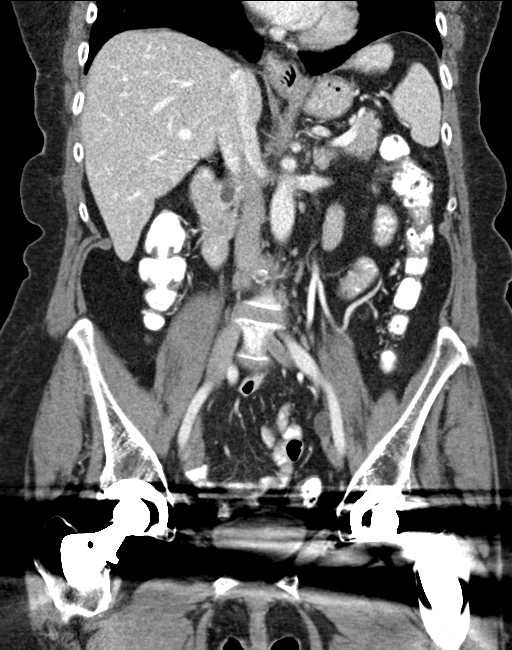
[im 42/76  soft-tissue]
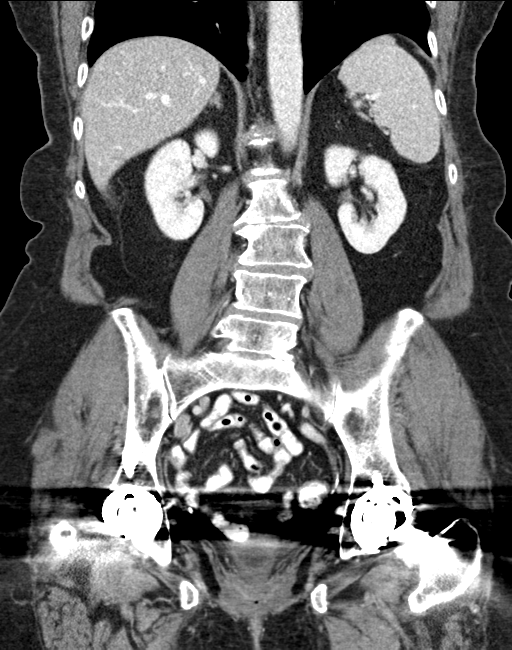

[16 of 46 positions shown; findings below may reference images not displayed]

FINDINGS: Lower Chest: No acute findings.

Hepatobiliary: No hepatic masses identified. Several small hepatic
cysts remain stable. Prior cholecystectomy. No evidence of biliary
obstruction.

Pancreas:  No mass or inflammatory changes.

Spleen: Within normal limits in size and appearance.

Adrenals/Urinary Tract: No masses identified. No evidence of
ureteral calculi or hydronephrosis. Visualization of bladder is
limited by extensive beam hardening artifact from bilateral hip
prostheses.

Stomach/Bowel: No evidence of obstruction, inflammatory process or
abnormal fluid collections.

Vascular/Lymphatic: No pathologically enlarged lymph nodes. No
abdominal aortic aneurysm.

Reproductive: Prior hysterectomy. Visualization of the lower pelvis
is limited by extensive beam hardening artifact from bilateral hip
prostheses. No masses or abnormal fluid collections identified.

Other:  None.

Musculoskeletal:  No suspicious bone lesions identified.
IMPRESSION: No acute findings or other significant abnormality within the
abdomen or pelvis.

## 2022-02-18 ENCOUNTER — Encounter (HOSPITAL_COMMUNITY)
Admission: RE | Admit: 2022-02-18 | Discharge: 2022-02-18 | Disposition: A | Discharge status: Home / Self Care | Payer: Medicare Other | Source: Ambulatory Visit | Attending: Cardiology | Admitting: Cardiology

## 2022-02-18 ENCOUNTER — Ambulatory Visit (HOSPITAL_COMMUNITY)
Admission: RE | Admit: 2022-02-18 | Discharge: 2022-02-18 | Disposition: A | Discharge status: Home / Self Care | Payer: Medicare Other | Source: Ambulatory Visit | Attending: Cardiology | Admitting: Cardiology

## 2022-02-18 DIAGNOSIS — I5032 Chronic diastolic (congestive) heart failure: Secondary | ICD-10-CM

## 2022-02-18 DIAGNOSIS — Z8679 Personal history of other diseases of the circulatory system: Secondary | ICD-10-CM | POA: Diagnosis not present

## 2022-02-18 DIAGNOSIS — E854 Organ-limited amyloidosis: Secondary | ICD-10-CM | POA: Diagnosis not present

## 2022-02-18 MED ORDER — TECHNETIUM TC 99M PYROPHOSPHATE
20.3000 | Freq: Once | INTRAVENOUS | Status: AC | PRN
  Administered 2022-02-18: 20.3 via INTRAVENOUS
  Filled 2022-02-18: qty 21

## 2022-02-21 ENCOUNTER — Other Ambulatory Visit (HOSPITAL_COMMUNITY): Payer: Self-pay | Admitting: Cardiology

## 2022-03-02 NOTE — Progress Notes (Signed)
Remote pacemaker transmission.   

## 2022-03-17 ENCOUNTER — Telehealth: Payer: Self-pay | Admitting: Internal Medicine

## 2022-03-17 NOTE — Telephone Encounter (Signed)
LVM for pt to rtn my call to schedule AWV with [facility] call back # [phone] 

## 2022-03-23 ENCOUNTER — Encounter: Payer: Self-pay | Admitting: Internal Medicine

## 2022-03-23 ENCOUNTER — Ambulatory Visit (INDEPENDENT_AMBULATORY_CARE_PROVIDER_SITE_OTHER): Payer: Medicare Other | Admitting: Internal Medicine

## 2022-03-23 VITALS — BP 122/82 | HR 70 | Temp 98.9°F | Ht 63.0 in | Wt 169.0 lb

## 2022-03-23 DIAGNOSIS — I10 Essential (primary) hypertension: Secondary | ICD-10-CM

## 2022-03-23 DIAGNOSIS — R739 Hyperglycemia, unspecified: Secondary | ICD-10-CM | POA: Diagnosis not present

## 2022-03-23 DIAGNOSIS — Z23 Encounter for immunization: Secondary | ICD-10-CM

## 2022-03-23 DIAGNOSIS — I509 Heart failure, unspecified: Secondary | ICD-10-CM | POA: Diagnosis not present

## 2022-03-23 DIAGNOSIS — E559 Vitamin D deficiency, unspecified: Secondary | ICD-10-CM | POA: Diagnosis not present

## 2022-03-23 DIAGNOSIS — E041 Nontoxic single thyroid nodule: Secondary | ICD-10-CM | POA: Diagnosis not present

## 2022-03-23 DIAGNOSIS — E538 Deficiency of other specified B group vitamins: Secondary | ICD-10-CM | POA: Diagnosis not present

## 2022-03-23 DIAGNOSIS — Z0001 Encounter for general adult medical examination with abnormal findings: Secondary | ICD-10-CM | POA: Diagnosis not present

## 2022-03-23 DIAGNOSIS — F411 Generalized anxiety disorder: Secondary | ICD-10-CM | POA: Diagnosis not present

## 2022-03-23 LAB — LIPID PANEL
Cholesterol: 162 mg/dL (ref 0–200)
HDL: 77.1 mg/dL (ref >39.00)
LDL Cholesterol: 71 mg/dL (ref 0–99)
NonHDL: 84.47
Total CHOL/HDL Ratio: 2
Triglycerides: 67 mg/dL (ref 0.0–149.0)
VLDL: 13.4 mg/dL (ref 0.0–40.0)

## 2022-03-23 LAB — HEPATIC FUNCTION PANEL
ALT: 15 U/L (ref 0–35)
AST: 16 U/L (ref 0–37)
Albumin: 4.3 g/dL (ref 3.5–5.2)
Alkaline Phosphatase: 107 U/L (ref 39–117)
Bilirubin, Direct: 0.1 mg/dL (ref 0.0–0.3)
Total Bilirubin: 0.7 mg/dL (ref 0.2–1.2)
Total Protein: 7.1 g/dL (ref 6.0–8.3)

## 2022-03-23 LAB — VITAMIN D 25 HYDROXY (VIT D DEFICIENCY, FRACTURES): VITD: 31.1 ng/mL (ref 30.00–100.00)

## 2022-03-23 LAB — CBC WITH DIFFERENTIAL/PLATELET
Basophils Absolute: 0.1 10*3/uL (ref 0.0–0.1)
Basophils Relative: 0.8 % (ref 0.0–3.0)
Eosinophils Absolute: 0.1 10*3/uL (ref 0.0–0.7)
Eosinophils Relative: 1.3 % (ref 0.0–5.0)
HCT: 40.5 % (ref 36.0–46.0)
Hemoglobin: 13.4 g/dL (ref 12.0–15.0)
Lymphocytes Relative: 32.9 % (ref 12.0–46.0)
Lymphs Abs: 2 10*3/uL (ref 0.7–4.0)
MCHC: 33.2 g/dL (ref 30.0–36.0)
MCV: 82.6 fl (ref 78.0–100.0)
Monocytes Absolute: 0.3 10*3/uL (ref 0.1–1.0)
Monocytes Relative: 5.3 % (ref 3.0–12.0)
Neutro Abs: 3.7 10*3/uL (ref 1.4–7.7)
Neutrophils Relative %: 59.7 % (ref 43.0–77.0)
Platelets: 237 10*3/uL (ref 150.0–400.0)
RBC: 4.9 Mil/uL (ref 3.87–5.11)
RDW: 14 % (ref 11.5–15.5)
WBC: 6.2 10*3/uL (ref 4.0–10.5)

## 2022-03-23 LAB — BASIC METABOLIC PANEL
BUN: 9 mg/dL (ref 6–23)
CO2: 25 mEq/L (ref 19–32)
Calcium: 9.9 mg/dL (ref 8.4–10.5)
Chloride: 104 mEq/L (ref 96–112)
Creatinine, Ser: 0.67 mg/dL (ref 0.40–1.20)
GFR: 84.79 mL/min (ref >60.00)
Glucose, Bld: 105 mg/dL — ABNORMAL HIGH (ref 70–99)
Potassium: 3.9 mEq/L (ref 3.5–5.1)
Sodium: 138 mEq/L (ref 135–145)

## 2022-03-23 LAB — VITAMIN B12: Vitamin B-12: 314 pg/mL (ref 211–911)

## 2022-03-23 LAB — MICROALBUMIN / CREATININE URINE RATIO
Creatinine,U: 46.4 mg/dL
Microalb Creat Ratio: 1.5 mg/g (ref 0.0–30.0)
Microalb, Ur: <0.7 mg/dL (ref 0.0–1.9)

## 2022-03-23 LAB — URINALYSIS, ROUTINE W REFLEX MICROSCOPIC
Bilirubin Urine: NEGATIVE
Ketones, ur: NEGATIVE
Nitrite: NEGATIVE
Specific Gravity, Urine: 1.01 (ref 1.000–1.030)
Total Protein, Urine: NEGATIVE
Urine Glucose: NEGATIVE
Urobilinogen, UA: 0.2 (ref 0.0–1.0)
pH: 7 (ref 5.0–8.0)

## 2022-03-23 LAB — HEMOGLOBIN A1C: Hgb A1c MFr Bld: 6.1 % (ref 4.6–6.5)

## 2022-03-23 LAB — TSH: TSH: 1.87 u[IU]/mL (ref 0.35–5.50)

## 2022-03-23 LAB — MAGNESIUM: Magnesium: 1.9 mg/dL (ref 1.5–2.5)

## 2022-03-23 MED ORDER — ALPRAZOLAM 0.5 MG PO TABS: 0.2500 mg | ORAL_TABLET | Freq: Every day | ORAL | 1 refills | Status: AC | PRN

## 2022-03-23 NOTE — Addendum Note (Signed)
Addended by: Max Sane on: 03/23/2022 10:22 AM   Modules accepted: Orders

## 2022-03-23 NOTE — Assessment & Plan Note (Signed)
Stable volume, cont current med tx, f/u Cardiology as planned

## 2022-03-23 NOTE — Assessment & Plan Note (Signed)
Last biopsy 2031, pt requests f/u u/s and endo referral due to last endo retirement

## 2022-03-23 NOTE — Assessment & Plan Note (Signed)
Pt has made #90 xanax last for 2 yrs; overall stable but still mild recurrent with GI upset improves with xanax - for refill

## 2022-03-23 NOTE — Assessment & Plan Note (Signed)
Lab Results  Component Value Date   HGBA1C 6.2 07/21/2020   Stable, pt to continue current medical treatment  - diet, wt control, excercise

## 2022-03-23 NOTE — Patient Instructions (Addendum)
You had the flu shot today  Please have your Shingrix (shingles) shots done at your local pharmacy.  Please continue all other medications as before, and refills have been done if requested - the xanax  Please have the pharmacy call with any other refills you may need.  Please continue your efforts at being more active, low cholesterol diet, and weight control.  You are otherwise up to date with prevention measures today.  Please keep your appointments with your specialists as you may have planned - cardiology Dr Kendall Flack will be contacted regarding the referral for: thyroid u/s and endocrinology  Please go to the LAB at the blood drawing area for the tests to be done, including the magnesium, VIT D and B12  You will be contacted by phone if any changes need to be made immediately.  Otherwise, you will receive a letter about your results with an explanation, but please check with MyChart first.  Please remember to sign up for MyChart if you have not done so, as this will be important to you in the future with finding out test results, communicating by private email, and scheduling acute appointments online when needed.  Please make an Appointment to return in 6 months, or sooner if needed

## 2022-03-23 NOTE — Progress Notes (Signed)
Patient ID: Renee [name], female   DOB: 05-Apr-1946, 76 y.o.   MRN: [id number]         Chief Complaint:: wellness exam and Annual Exam (She doesn't have the Xanax but wants it)  , f/u magnesium, low Vit D and b12, anxiety       HPI:  Renee [name] is a 76 y.o. female here for wellness exam; ok for flu shot, will consider shingrix at the pharmacy, o/w up to date                        Also Pt denies chest pain, increased sob or doe, wheezing, orthopnea, PND, increased LE swelling, palpitations, dizziness or syncope.  Good med compliance. 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  No longer mow the yard, but able to do ADLs and work well, no cane or recent falls.   Pt denies polydipsia, polyuria, or new focal neuro s/s.   Pt denies fever, wt loss, night sweats, loss of appetite, or other constitutional symptoms.  Denies hyper or hypo thyroid symptoms such as voice, skin or hair change, but is due for f/u u/s and new endo referral as last endo has retired.     Wt Readings from Last 3 Encounters:  03/23/22 169 lb (76.7 kg)  02/10/22 164 lb (74.4 kg)  01/07/22 164 lb (74.4 kg)   BP Readings from Last 3 Encounters:  03/23/22 122/82  02/10/22 (!) 152/82  01/07/22 140/80   Immunization History  Administered Date(s) Administered   Fluad Quad(high Dose 65+) 03/05/2019, 04/30/2020, 03/17/2021   Influenza Split 03/14/2011, 04/02/2013   Influenza Whole 03/11/2009, 03/29/2010   Influenza, High Dose Seasonal PF 04/30/2015, 03/21/2017, 03/24/2018   Influenza,inj,Quad PF,6+ Mos 06/11/2014, 03/04/2016   Influenza-Unspecified 06/14/2015, 03/13/2018   PFIZER(Purple Top)SARS-COV-2 Vaccination 07/20/2019, 08/10/2019   Pneumococcal Conjugate-13 12/18/2014   Pneumococcal Polysaccharide-23 09/07/2015   Tdap 09/28/2013   Health Maintenance Due  Topic Date Due   HEMOGLOBIN A1C  01/18/2021   Diabetic kidney evaluation - Urine ACR  07/21/2021   INFLUENZA VACCINE  01/11/2022      Past Medical History:   Diagnosis Date   Allergy    tape, electrodes   Anxiety    AV nodal re-entry tachycardia    a. s/p RFA 1993- Dr. Rolland Porter, [facility]   Broken neck [facility])    1993   Cervical cancer ([place])    hx of cervical   ca   CHF (congestive heart failure) ([place])    Chronic chest pain    a. 2005 Cath: Mild nonobstructive plaque (30-40% LCX);  b. 08/2011 low risk myoview;  c. 09/2011 Coronary CT: NL cors w ca score of 0.   Chronic pain syndrome    Colon polyps    Complication of anesthesia    blood pressure and heart dropped in 2009     Concussion    1979, 1993   Degenerative joint disease    a. s/p L Total Knee Arthroplasty.   Diet-controlled type 2 diabetes mellitus ([place])    pt. denies   Diverticulosis of colon    Duodenitis without hemorrhage    Dysrhythmia    REPORTS , SINCE PLACEMENT OF PACEMAKER, STILL CAN FEEL HEART " GOING IN AND OUT OF [facility]" REPORTS, YESTERDAY HER BP SYSTOLIC WAS IN THE 875I AND WAS BREATHLESS, HR 112, SWEATING ; DENIES SYNCOPE  , DENIES CHEST PAIN    Fibromyalgia    GERD (gastroesophageal reflux disease)  Hemorrhoids    Hypertension    Irritable bowel syndrome    Long QT interval    a. mild - advised to avoid meds that may prolong QT.   Myocardial infarction [facility]) 09/1991   does not know when second heart attack was   Neuropathy    Osteoporosis    Paroxysmal A-fib (HCC)    PER PATIENT. I CAN FEEL MY HEART RACING WHEN IM IN IT    Recurrent upper respiratory infection (URI)    SVT (supraventricular tachycardia)    a. nonsustained SVT - previously offered flecainide but refused;  b. 12/2012 30 day event monitor w/o significant arrhythmias.   Thyroid nodule    Past Surgical History:  Procedure Laterality Date   ABLATION OF DYSRHYTHMIC FOCUS  1993   ANTERIOR AND POSTERIOR REPAIR N/A 08/01/2017   Procedure: ANTERIOR (CYSTOCELE) AND POSTERIOR REPAIR (RECTOCELE);  Surgeon: Bjorn Loser, MD;  Location: [facility];  Service: Urology;  Laterality: N/A;   APPENDECTOMY      1980   BILATERAL SALPINGECTOMY Left 08/01/2017   Procedure: SALPINGECTOMY;  Surgeon: Bobbye Charleston, MD;  Location: [facility];  Service: Gynecology;  Laterality: Left;   BIOPSY THYROID     CARDIAC ELECTROPHYSIOLOGY STUDY AND ABLATION     atrioventricular nodal reentant tachycardia   CHOLECYSTECTOMY  1980   COLONOSCOPY  06/25/2008   Diverticulosis and Hemorrhoids   COLPOSCOPY     CORONARY ANGIOPLASTY  2007   CYSTOSCOPY N/A 08/01/2017   Procedure: CYSTOSCOPY;  Surgeon: Bjorn Loser, MD;  Location: [facility];  Service: Urology;  Laterality: N/A;   EP IMPLANTABLE DEVICE N/A 03/03/2016   Procedure: Pacemaker Implant;  Surgeon: Evans Lance, MD;  Location: [facility];  Service: Cardiovascular;  Laterality: N/A;   ESOPHAGOGASTRODUODENOSCOPY     HYSTEROSCOPY  2002   with resection of endometrial polyps. by Dr.MCPhail   HYSTEROSCOPY WITH D & C N/A 02/05/2015   Procedure: DILATATION AND CURETTAGE /HYSTEROSCOPY;  Surgeon: Vanessa Kick, MD;  Location: [facility];  Service: Gynecology;  Laterality: N/A;   LOOP RECORDER IMPLANT  08/21/2013   MDT LinQ implanted by Dr Lovena Le for syncope   LOOP RECORDER IMPLANT N/A 08/21/2013   Procedure: LOOP RECORDER IMPLANT;  Surgeon: Evans Lance, MD;  Location: [facility] CATH LAB;  Service: Cardiovascular;  Laterality: N/A;   ORIF RADIAL HEAD / NECK FRACTURE  09/05/88   TILT TABLE STUDY N/A 05/15/2013   Procedure: TILT TABLE STUDY;  Surgeon: Deboraha Sprang, MD;  Location: [facility] CATH LAB;  Service: Cardiovascular;  Laterality: N/A;   TOTAL HIP ARTHROPLASTY  10/10   Left, at [place] N/A 08/01/2017   Procedure: HYSTERECTOMY VAGINAL;  Surgeon: Bobbye Charleston, MD;  Location: [facility];  Service: Gynecology;  Laterality: N/A;    reports that she has never smoked. She has never used smokeless tobacco. She reports that she does not currently use alcohol. She reports that she does not use drugs. family history  includes Asthma in her mother and sister; Diabetes in her brother, mother, and sister; Heart disease in her sister and sister; Lung cancer in her father; Ovarian cancer in her mother; Stroke in her mother. Allergies  Allergen Reactions   Cyclobenzaprine Other (See Comments)    Pt states that this medication does not work for her.     Azithromycin Other (See Comments)    Cannot take due to prolonged QT   Nitrofurantoin Other (See Comments)  Adhesive [Tape] Itching, Rash and Other (See Comments)    Reaction:  Blisters  Pt states that she is allergic to the adhesive backing of EKG pads if left on for more than 24 hours Patient states she tolerates paper tape ok   Moxifloxacin Other (See Comments)    Unknown allergic reaction many years ago   Quinolones Other (See Comments)    Unknown allergic reaction many years ago   Current Outpatient Medications on File Prior to Visit  Medication Sig Dispense Refill   ACCU-CHEK FASTCLIX LANCETS MISC USE UP TO 4 TIMES DAILY AS  DIRECTED 408 each 1   ACCU-CHEK GUIDE test strip USE UP TO 4 TIMES DAILY AS  DIRECTED 100 each 1   aspirin EC 81 MG tablet Take 81 mg by mouth daily.     blood glucose meter kit and supplies KIT Dispense per patient and insurance preferred. Use up to four times daily as directed. E11.9 1 each 0   cetirizine (ZYRTEC) 10 MG tablet Take 10 mg by mouth daily as needed.     diltiazem (CARDIZEM CD) 240 MG 24 hr capsule Take 1 capsule (240 mg total) by mouth every morning. 90 capsule 3   diltiazem (CARDIZEM) 30 MG tablet Take 2 tablets (60 mg total) by mouth 4 (four) times daily as needed (as needed for breakthrough heart racing/palpitations). 60 tablet 3   flecainide (TAMBOCOR) 50 MG tablet TAKE 1 TABLET BY MOUTH  TWICE DAILY 200 tablet 2   furosemide (LASIX) 20 MG tablet Take 20 mg by mouth daily as needed for fluid.     isosorbide mononitrate (IMDUR) 30 MG 24 hr tablet Take 30 mg by mouth as needed.     losartan (COZAAR) 50 MG tablet  Take 1 tablet (50 mg total) by mouth 2 (two) times daily. 180 tablet 3   nitroGLYCERIN (NITROSTAT) 0.4 MG SL tablet DISSOLVE 1 TABLET UNDER THE TONGUE EVERY 5 MINUTES AS  NEEDED FOR CHEST PAIN. MAX  OF 3 TABLETS IN 15 MINUTES. CALL 911 IF PAIN PERSISTS. 100 tablet 3   omeprazole (PRILOSEC) 40 MG capsule TAKE 1 CAPSULE BY MOUTH IN  THE MORNING AND AT BEDTIME 180 capsule 3   trimethoprim (TRIMPEX) 100 MG tablet Take 100 mg by mouth daily as needed (For UTI per patient).     zolpidem (AMBIEN) 5 MG tablet TAKE 1 TABLET BY MOUTH AT  BEDTIME AS NEEDED FOR SLEEP 90 tablet 1   No current facility-administered medications on file prior to visit.        ROS:  All others reviewed and negative.  Objective        PE:  BP 122/82 (BP Location: Left Arm, Patient Position: Sitting, Cuff Size: Normal)   Pulse 70   Temp 98.9 F (37.2 C) (Oral)   Ht $R'5\' 3"'IN$  (1.6 m)   Wt 169 lb (76.7 kg)   SpO2 99%   BMI 29.94 kg/m                 Constitutional: Pt appears in NAD               HENT: Head: NCAT.                Right Ear: External ear normal.                 Left Ear: External ear normal.                Eyes: . Pupils are  equal, round, and reactive to light. Conjunctivae and EOM are normal               Nose: without d/c or deformity               Neck: Neck supple. Gross normal ROM               Cardiovascular: Normal rate and regular rhythm.                 Pulmonary/Chest: Effort normal and breath sounds without rales or wheezing.                Abd:  Soft, NT, ND, + BS, no organomegaly               Neurological: Pt is alert. At baseline orientation, motor grossly intact               Skin: Skin is warm. No rashes, no other new lesions, LE edema - none               Psychiatric: Pt behavior is normal without agitation   Micro: none  Cardiac tracings I have personally interpreted today:  none  Pertinent Radiological findings (summarize): none   Lab Results  Component Value Date   WBC 7.1  02/10/2022   HGB 13.6 02/10/2022   HCT 41.5 02/10/2022   PLT 263 02/10/2022   GLUCOSE 145 (H) 02/10/2022   CHOL 175 07/21/2020   TRIG 114.0 07/21/2020   HDL 69.30 07/21/2020   LDLCALC 83 07/21/2020   ALT 21 12/16/2020   AST 19 12/16/2020   NA 139 02/10/2022   K 4.1 02/10/2022   CL 109 02/10/2022   CREATININE 0.92 02/10/2022   BUN 12 02/10/2022   CO2 22 02/10/2022   TSH 1.31 11/15/2021   INR 0.9 03/31/2020   HGBA1C 6.2 07/21/2020   MICROALBUR 2.9 (H) 07/21/2020   Assessment/Plan:  Renee [name] is a 76 y.o. White or Caucasian [1] female with  has a past medical history of Allergy, Anxiety, AV nodal re-entry tachycardia, Broken neck (HCC), Cervical cancer (HCC), CHF (congestive heart failure) ([place]), Chronic chest pain, Chronic pain syndrome, Colon polyps, Complication of anesthesia, Concussion, Degenerative joint disease, Diet-controlled type 2 diabetes mellitus ([place]), Diverticulosis of colon, Duodenitis without hemorrhage, Dysrhythmia, Fibromyalgia, GERD (gastroesophageal reflux disease), Hemorrhoids, Hypertension, Irritable bowel syndrome, Long QT interval, Myocardial infarction ([place]) (09/1991), Neuropathy, Osteoporosis, Paroxysmal A-fib ([place]), Recurrent upper respiratory infection (URI), SVT (supraventricular tachycardia), and Thyroid nodule.  Hyperglycemia Lab Results  Component Value Date   HGBA1C 6.2 07/21/2020   Stable, pt to continue current medical treatment  - diet, wt control, excercise   Essential hypertension . BP Readings from Last 3 Encounters:  03/23/22 122/82  02/10/22 (!) 152/82  01/07/22 140/80   Stable, pt to continue medical treatment cardizem cd 240 mg, losartan 50 mg qd   Anxiety state Pt has made #90 xanax last for 2 yrs; overall stable but still mild recurrent with GI upset improves with xanax - for refill  THYROID NODULE, right lobe. Last biopsy 2031, pt requests f/u u/s and endo referral due to last endo retirement  Chronic congestive heart  failure ([place]) Stable volume, cont current med tx, f/u Cardiology as planned  Followup: Return in about 6 months (around 09/22/2022).  Cathlean Cower, MD 03/23/2022 9:49 AM [facility]

## 2022-03-23 NOTE — Assessment & Plan Note (Signed)
.   BP Readings from Last 3 Encounters:  03/23/22 122/82  02/10/22 (!) 152/82  01/07/22 140/80   Stable, pt to continue medical treatment cardizem cd 240 mg, losartan 50 mg qd

## 2022-04-11 ENCOUNTER — Other Ambulatory Visit: Payer: Self-pay | Admitting: Internal Medicine

## 2022-04-11 DIAGNOSIS — Z1231 Encounter for screening mammogram for malignant neoplasm of breast: Secondary | ICD-10-CM

## 2022-04-22 ENCOUNTER — Ambulatory Visit: Payer: Medicare Other | Admitting: Internal Medicine

## 2022-04-27 DIAGNOSIS — H2513 Age-related nuclear cataract, bilateral: Secondary | ICD-10-CM | POA: Diagnosis not present

## 2022-05-09 ENCOUNTER — Ambulatory Visit (INDEPENDENT_AMBULATORY_CARE_PROVIDER_SITE_OTHER): Payer: Medicare Other

## 2022-05-09 DIAGNOSIS — I495 Sick sinus syndrome: Secondary | ICD-10-CM | POA: Diagnosis not present

## 2022-05-09 LAB — CUP PACEART REMOTE DEVICE CHECK
Date Time Interrogation Session: 20231127095406
Implantable Lead Connection Status: 753985
Implantable Lead Connection Status: 753985
Implantable Lead Implant Date: 20170921
Implantable Lead Implant Date: 20170921
Implantable Lead Location: 753859
Implantable Lead Location: 753860
Implantable Lead Model: 377
Implantable Lead Model: 377
Implantable Lead Serial Number: 49575519
Implantable Lead Serial Number: 49580836
Implantable Pulse Generator Implant Date: 20170921
Pulse Gen Model: 407145
Pulse Gen Serial Number: 68850891

## 2022-05-10 ENCOUNTER — Ambulatory Visit
Admission: RE | Admit: 2022-05-10 | Discharge: 2022-05-10 | Disposition: A | Discharge status: Home / Self Care | Payer: Medicare Other | Source: Ambulatory Visit | Attending: Internal Medicine | Admitting: Internal Medicine

## 2022-05-10 DIAGNOSIS — Z1231 Encounter for screening mammogram for malignant neoplasm of breast: Secondary | ICD-10-CM | POA: Diagnosis not present

## 2022-05-17 ENCOUNTER — Other Ambulatory Visit (HOSPITAL_COMMUNITY): Payer: Self-pay | Admitting: Cardiology

## 2022-05-23 ENCOUNTER — Ambulatory Visit (INDEPENDENT_AMBULATORY_CARE_PROVIDER_SITE_OTHER): Payer: Medicare Other | Admitting: Internal Medicine

## 2022-05-23 ENCOUNTER — Encounter: Payer: Self-pay | Admitting: Internal Medicine

## 2022-05-23 VITALS — BP 138/80 | HR 97 | Temp 98.2°F | Ht 63.0 in | Wt 170.0 lb

## 2022-05-23 DIAGNOSIS — J069 Acute upper respiratory infection, unspecified: Secondary | ICD-10-CM

## 2022-05-23 MED ORDER — HYDROCODONE BIT-HOMATROP MBR 5-1.5 MG/5ML PO SOLN: 5.0000 mL | Freq: Three times a day (TID) | ORAL | 0 refills | Status: DC | PRN

## 2022-05-23 NOTE — Patient Instructions (Signed)
We have sent in the cough medicine to use 5 mL up to 3 times a day.

## 2022-05-23 NOTE — Progress Notes (Unsigned)
   Subjective:   Patient ID: Renee [name], female    DOB: 1946/04/25, 76 y.o.   MRN: [id number]  HPI The patient is a 76 YO female coming in for cough 3 weeks.   Review of Systems  Objective:  Physical Exam  Vitals:   05/23/22 1452  BP: 138/80  Pulse: 97  Temp: 98.2 F (36.8 C)  TempSrc: Oral  SpO2: 97%  Weight: 170 lb (77.1 kg)  Height: '5\' 3"'$  (1.6 m)    Assessment & Plan:

## 2022-05-25 DIAGNOSIS — R062 Wheezing: Secondary | ICD-10-CM | POA: Diagnosis not present

## 2022-05-25 DIAGNOSIS — R059 Cough, unspecified: Secondary | ICD-10-CM | POA: Diagnosis not present

## 2022-05-25 DIAGNOSIS — J40 Bronchitis, not specified as acute or chronic: Secondary | ICD-10-CM | POA: Diagnosis not present

## 2022-05-26 ENCOUNTER — Encounter: Payer: Self-pay | Admitting: Internal Medicine

## 2022-05-26 NOTE — Assessment & Plan Note (Signed)
Suspect resolving URI. She is still having moderate cough so rx hydrocodone cough syrup. No antibiotics are indicated.

## 2022-05-30 DIAGNOSIS — R051 Acute cough: Secondary | ICD-10-CM | POA: Diagnosis not present

## 2022-05-30 DIAGNOSIS — J40 Bronchitis, not specified as acute or chronic: Secondary | ICD-10-CM | POA: Diagnosis not present

## 2022-06-08 ENCOUNTER — Telehealth: Payer: Self-pay

## 2022-06-08 NOTE — Telephone Encounter (Signed)
Received transmission from [employer] this am.  Device function is normal. No episodes/high rates/or arrhythmias detected.   Spoke with patient.  She describes her severe chest pain radiating to arm and neck episode yesterday, episode last 1.5 hours. NTG (3) taken, Called 911, pain eased before emergency services arrived. They did not take to ER.   No chest pain today. She is acutely sick, trying to recover from bronchitis. She did not finish her abx given on 12/18 due to it giving her severe diarrhea. SOB with exertion ? Chest pain cardiac origin versus progressing respiratory infection.   She sees Dr. Jenny Reichmann (PCP) this Friday, 12/29)  Will forward to Dr. Jenny Reichmann and to Dr. Aundra Dubin  If anything further.   Patient is aware if sx's worsen or chest pain returns, she is call 911 and go to the ER.  She verbalizes understanding.

## 2022-06-08 NOTE — Telephone Encounter (Signed)
Evaluation in ER needed if chest pain returns.

## 2022-06-08 NOTE — Telephone Encounter (Signed)
The patient states she was having major chest pains around 1:30 pm. It was in her chest and radiated her her arms and neck.She took 3 nitros. She called EMS and they did an EKG but they left and didn't take her to the hospital. She would like for the nurse to look at the transmission to see if anything on there. I asked her was she having chest pains today and she states no. Just sore on her arms and neck.  I told her the nurse will give her a call back.

## 2022-06-09 ENCOUNTER — Encounter (HOSPITAL_COMMUNITY): Payer: Self-pay

## 2022-06-09 ENCOUNTER — Emergency Department (HOSPITAL_COMMUNITY)
Admission: EM | Admit: 2022-06-09 | Discharge: 2022-06-10 | Disposition: A | Discharge status: Home / Self Care | Payer: Medicare Other | Attending: Emergency Medicine | Admitting: Emergency Medicine

## 2022-06-09 ENCOUNTER — Emergency Department (HOSPITAL_COMMUNITY): Payer: Medicare Other

## 2022-06-09 ENCOUNTER — Other Ambulatory Visit: Payer: Self-pay

## 2022-06-09 DIAGNOSIS — I110 Hypertensive heart disease with heart failure: Secondary | ICD-10-CM | POA: Diagnosis not present

## 2022-06-09 DIAGNOSIS — Z8541 Personal history of malignant neoplasm of cervix uteri: Secondary | ICD-10-CM | POA: Diagnosis not present

## 2022-06-09 DIAGNOSIS — Z950 Presence of cardiac pacemaker: Secondary | ICD-10-CM | POA: Diagnosis not present

## 2022-06-09 DIAGNOSIS — E119 Type 2 diabetes mellitus without complications: Secondary | ICD-10-CM | POA: Insufficient documentation

## 2022-06-09 DIAGNOSIS — R059 Cough, unspecified: Secondary | ICD-10-CM | POA: Diagnosis present

## 2022-06-09 DIAGNOSIS — U071 COVID-19: Secondary | ICD-10-CM | POA: Diagnosis not present

## 2022-06-09 DIAGNOSIS — R079 Chest pain, unspecified: Secondary | ICD-10-CM | POA: Diagnosis not present

## 2022-06-09 DIAGNOSIS — Z7982 Long term (current) use of aspirin: Secondary | ICD-10-CM | POA: Insufficient documentation

## 2022-06-09 DIAGNOSIS — I509 Heart failure, unspecified: Secondary | ICD-10-CM | POA: Diagnosis not present

## 2022-06-09 DIAGNOSIS — R0789 Other chest pain: Secondary | ICD-10-CM | POA: Diagnosis not present

## 2022-06-09 LAB — BASIC METABOLIC PANEL
Anion gap: 7 (ref 5–15)
BUN: 5 mg/dL — ABNORMAL LOW (ref 8–23)
CO2: 24 mmol/L (ref 22–32)
Calcium: 9.3 mg/dL (ref 8.9–10.3)
Chloride: 105 mmol/L (ref 98–111)
Creatinine, Ser: 0.69 mg/dL (ref 0.44–1.00)
GFR, Estimated: >60 mL/min (ref >60)
Glucose, Bld: 96 mg/dL (ref 70–99)
Potassium: 3.4 mmol/L — ABNORMAL LOW (ref 3.5–5.1)
Sodium: 136 mmol/L (ref 135–145)

## 2022-06-09 LAB — TROPONIN I (HIGH SENSITIVITY)
Troponin I (High Sensitivity): 5 ng/L (ref <18)
Troponin I (High Sensitivity): 5 ng/L (ref <18)

## 2022-06-09 LAB — RESP PANEL BY RT-PCR (RSV, FLU A&B, COVID)  RVPGX2
Influenza A by PCR: NEGATIVE
Influenza B by PCR: NEGATIVE
Resp Syncytial Virus by PCR: NEGATIVE
SARS Coronavirus 2 by RT PCR: POSITIVE — AB

## 2022-06-09 LAB — CBC
HCT: 41.2 % (ref 36.0–46.0)
Hemoglobin: 13.5 g/dL (ref 12.0–15.0)
MCH: 27.2 pg (ref 26.0–34.0)
MCHC: 32.8 g/dL (ref 30.0–36.0)
MCV: 82.9 fL (ref 80.0–100.0)
Platelets: 229 10*3/uL (ref 150–400)
RBC: 4.97 MIL/uL (ref 3.87–5.11)
RDW: 13.1 % (ref 11.5–15.5)
WBC: 5.2 10*3/uL (ref 4.0–10.5)
nRBC: 0 % (ref 0.0–0.2)

## 2022-06-09 NOTE — Telephone Encounter (Signed)
Pt aware and voiced understanding 

## 2022-06-09 NOTE — ED Provider Triage Note (Signed)
Emergency Medicine Provider Triage Evaluation Note  Renee [name] , a 76 y.o. female  was evaluated in triage.  Pt complains of persistent cough, SOB, fatiguex 1 month. Had chest pain yesterday, relieved by 3 nitroglycerin. Pain radiated to R arm and neck  Review of Systems  Positive: Cough, SOB, chest tightness Negative: N/V  Physical Exam  BP (!) 145/81   Pulse 89   Temp 99.5 F (37.5 C)   Resp 17   SpO2 99%  Gen:   Awake, no distress   Resp:  Normal effort  MSK:   Moves extremities without difficulty    Medical Decision Making  Medically screening exam initiated at 5:02 PM.  Appropriate orders placed.  MATTHEW PAIS was informed that the remainder of the evaluation will be completed by another provider, this initial triage assessment does not replace that evaluation, and the importance of remaining in the ED until their evaluation is complete.     Osvaldo Shipper, [place] 06/09/22 1705

## 2022-06-09 NOTE — ED Triage Notes (Signed)
Pt reports she had a sudden onset of chest pain yesterday around 1300 that radiated to her neck and arms. Pt called 911 but chose not to come to the ER. Pt states she took 3 nitro and the pain finally resolved after about 1 hour. Currently denies cp or sob at this time. Pt reports she has also been having chills and a cough for about 1 month now.

## 2022-06-10 ENCOUNTER — Ambulatory Visit: Payer: Medicare Other | Admitting: Internal Medicine

## 2022-06-10 MED ORDER — BENZONATATE 100 MG PO CAPS
100.0000 mg | ORAL_CAPSULE | Freq: Once | ORAL | Status: DC
  Filled 2022-06-10: qty 1

## 2022-06-10 MED ORDER — DELSYM 15 MG PO TABS: 30.0000 mg | ORAL_TABLET | Freq: Four times a day (QID) | ORAL | 0 refills | Status: AC | PRN

## 2022-06-10 MED ORDER — ACETAMINOPHEN 325 MG PO TABS
650.0000 mg | ORAL_TABLET | Freq: Once | ORAL | Status: AC
  Administered 2022-06-10: 650 mg via ORAL
  Filled 2022-06-10: qty 2

## 2022-06-10 MED ORDER — DEXTROMETHORPHAN POLISTIREX ER 30 MG/5ML PO SUER
15.0000 mg | Freq: Once | ORAL | Status: AC
  Administered 2022-06-10: 15 mg via ORAL
  Filled 2022-06-10: qty 5

## 2022-06-10 NOTE — ED Provider Notes (Addendum)
[facility] Provider Note  CSN: [id number] Arrival date & time: 06/09/22 1426  Chief Complaint(s) Chills, Cough, and Chest Pain  HPI Renee [name] is a 76 y.o. female     Cough Cough characteristics:  Harsh Severity:  Severe Onset quality:  Gradual Duration:  4 weeks Timing:  Constant Progression:  Improving Chronicity:  New Relieved by:  Nothing Worsened by:  Nothing Ineffective treatments:  Cough suppressants and rest (antibiotics Rx'd at [facility] for bronchitis) Associated symptoms: chest pain (due to coughing)   Associated symptoms: no fever, no shortness of breath and no sinus congestion   Chest Pain Pain location:  L chest and R chest Pain quality: aching and dull   Pain radiates to:  Does not radiate Progression:  Waxing and waning Context comment:  Due to coughing Associated symptoms: cough   Associated symptoms: no fever and no shortness of breath    Patient tested negative for COVID and Flu on 12/13 during UC visit.  Past Medical History Past Medical History:  Diagnosis Date   Allergy    tape, electrodes   Anxiety    AV nodal re-entry tachycardia    a. s/p RFA 1993- Dr. Rolland Porter, [facility]   Broken neck [facility])    1993   Cervical cancer [facility])    hx of cervical   ca   CHF (congestive heart failure) ([place])    Chronic chest pain    a. 2005 Cath: Mild nonobstructive plaque (30-40% LCX);  b. 08/2011 low risk myoview;  c. 09/2011 Coronary CT: NL cors w ca score of 0.   Chronic pain syndrome    Colon polyps    Complication of anesthesia    blood pressure and heart dropped in 2009     Concussion    1979, 1993   Degenerative joint disease    a. s/p L Total Knee Arthroplasty.   Diet-controlled type 2 diabetes mellitus ([place])    pt. denies   Diverticulosis of colon    Duodenitis without hemorrhage    Dysrhythmia    REPORTS , SINCE PLACEMENT OF PACEMAKER, STILL CAN FEEL HEART " GOING IN AND OUT OF [facility]" REPORTS, YESTERDAY HER BP  SYSTOLIC WAS IN THE 297L AND WAS BREATHLESS, HR 112, SWEATING ; DENIES SYNCOPE  , DENIES CHEST PAIN    Fibromyalgia    GERD (gastroesophageal reflux disease)    Hemorrhoids    Hypertension    Irritable bowel syndrome    Long QT interval    a. mild - advised to avoid meds that may prolong QT.   Myocardial infarction [facility]) 09/1991   does not know when second heart attack was   Neuropathy    Osteoporosis    Paroxysmal A-fib (HCC)    PER PATIENT. I CAN FEEL MY HEART RACING WHEN IM IN IT    Recurrent upper respiratory infection (URI)    SVT (supraventricular tachycardia)    a. nonsustained SVT - previously offered flecainide but refused;  b. 12/2012 30 day event monitor w/o significant arrhythmias.   Thyroid nodule    Patient Active Problem List   Diagnosis Date Noted   Encounter for well adult exam with abnormal findings 03/23/2022   Chronic congestive heart failure ([place]) 03/23/2022   Cervical intraepithelial neoplasia grade 1 09/02/2020   Menopausal syndrome 09/02/2020   Overactive bladder 09/02/2020   Postmenopausal bleeding 09/02/2020   Left hip pain 09/02/2020   Cellulitis 08/21/2020   Recurrent UTI 07/21/2020   Multiple thyroid nodules  01/14/2020   Epigastric pain 12/10/2019   Atypical chest pain 12/10/2019   PVC's (premature ventricular contractions) 08/06/2019   Hoarseness 07/16/2019   Sinus node dysfunction (HCC) 03/11/2019   Chronic cough 03/05/2019   Urinary retention 03/05/2019   Generalized weakness 09/11/2018   Dysuria 05/09/2018   Acute sinus infection 04/20/2018   Other allergic rhinitis 04/20/2018   Cystocele, midline 08/01/2017   Hyperglycemia 01/31/2017   Bilateral foot pain 01/31/2017   Laryngopharyngeal reflux (LPR) 08/18/2016   Globus pharyngeus 08/01/2016   Thyroid nodule 08/01/2016   Cough 06/24/2016   Viral URI 05/17/2016   Syncope 03/03/2016   Pacemaker 03/03/2016   Chest pain at rest 09/13/2015   Spinal stenosis of lumbar region 09/07/2015    Raynaud phenomenon 09/07/2015   Insomnia 09/07/2015   Preventative health care 12/18/2014   Paroxysmal spells 01/28/2014   Dysthymia 01/28/2014   Injury of leg 10/07/2013   Headache(784.0) 03/14/2013   Eye problem 02/15/2013   Persistent headaches 02/15/2013   Pre-syncope 10/04/2011   NSVT (nonsustained ventricular tachycardia) ([place]) 10/04/2011   Dyspnea 10/01/2009   Diverticulosis of large intestine 09/24/2009   SWEATING 09/24/2009   FATIQUE AND MALAISE 09/16/2009   LONG QT SYNDROME 03/19/2009   AV NODAL REENTRY TACHYCARDIA 10/03/2008   Other dysphagia 06/17/2008   HEMORRHOIDS 06/16/2008   DUODENITIS, WITHOUT HEMORRHAGE 06/16/2008   CONSTIPATION, CHRONIC 06/16/2008   THYROID NODULE, right lobe. 05/26/2008   Chronic pain syndrome 05/26/2008   Coronary atherosclerosis 05/26/2008   BRONCHITIS, RECURRENT 05/26/2008   Irritable bowel syndrome 05/26/2008   FECAL INCONTINENCE 05/26/2008   Anxiety state 03/24/2008   Essential hypertension 03/24/2008   GERD 03/24/2008   DEGENERATIVE JOINT DISEASE 03/24/2008   BACK PAIN, LUMBAR 03/24/2008   Fibromyalgia 03/24/2008   Chest pain 03/24/2008   NEUROPATHY, HX OF 03/24/2008   Home Medication(s) Prior to Admission medications   Medication Sig Start Date End Date Taking? Authorizing Provider  Dextromethorphan HBr (DELSYM) 15 MG TABS Take 30 mg by mouth 4 (four) times daily as needed for up to 7 days. 06/10/22 06/17/22 Yes Nihal Marzella, Grayce Sessions, MD  ACCU-CHEK FASTCLIX LANCETS MISC USE UP TO 4 TIMES DAILY AS  DIRECTED 04/11/18   Biagio Borg, MD  ACCU-CHEK GUIDE test strip USE UP TO 4 TIMES DAILY AS  DIRECTED 04/11/18   Biagio Borg, MD  ALPRAZolam Duanne Moron) 0.5 MG tablet Take 0.5-1 tablets (0.25-0.5 mg total) by mouth daily as needed for anxiety. 03/23/22   Biagio Borg, MD  aspirin EC 81 MG tablet Take 81 mg by mouth daily.    [provider]  blood glucose meter kit and supplies KIT Dispense per patient and insurance preferred.  Use up to four times daily as directed. E11.9 02/21/18   Biagio Borg, MD  cetirizine (ZYRTEC) 10 MG tablet Take 10 mg by mouth daily as needed.    [provider]  diltiazem (CARDIZEM CD) 240 MG 24 hr capsule Take 1 capsule (240 mg total) by mouth every morning. 11/04/21   Larey Dresser, MD  diltiazem (CARDIZEM) 30 MG tablet Take 2 tablets (60 mg total) by mouth 4 (four) times daily as needed (as needed for breakthrough heart racing/palpitations). 09/11/19   Evans Lance, MD  flecainide (TAMBOCOR) 50 MG tablet TAKE 1 TABLET BY MOUTH  TWICE DAILY 02/22/22   Larey Dresser, MD  furosemide (LASIX) 20 MG tablet Take 20 mg by mouth daily as needed for fluid.    [provider]  HYDROcodone  bit-homatropine (HYCODAN) 5-1.5 MG/5ML syrup Take 5 mLs by mouth every 8 (eight) hours as needed for cough. 05/23/22   Hoyt Koch, MD  isosorbide mononitrate (IMDUR) 30 MG 24 hr tablet TAKE 1 TABLET BY MOUTH DAILY 05/17/22   Larey Dresser, MD  losartan (COZAAR) 50 MG tablet Take 1 tablet (50 mg total) by mouth 2 (two) times daily. 11/03/21   Larey Dresser, MD  nitroGLYCERIN (NITROSTAT) 0.4 MG SL tablet DISSOLVE 1 TABLET UNDER THE TONGUE EVERY 5 MINUTES AS  NEEDED FOR CHEST PAIN. MAX  OF 3 TABLETS IN 15 MINUTES. CALL 911 IF PAIN PERSISTS. 11/24/21   Larey Dresser, MD  omeprazole (PRILOSEC) 40 MG capsule TAKE 1 CAPSULE BY MOUTH IN  THE MORNING AND AT BEDTIME 11/15/21   Ladene Artist, MD  trimethoprim (TRIMPEX) 100 MG tablet Take 100 mg by mouth daily as needed (For UTI per patient). 06/24/20   [provider]  zolpidem (AMBIEN) 5 MG tablet TAKE 1 TABLET BY MOUTH AT  BEDTIME AS NEEDED FOR SLEEP 12/22/20   Biagio Borg, MD                                                                                                                                    Allergies Cyclobenzaprine, Azithromycin, Nitrofurantoin, Adhesive [tape], Moxifloxacin, and Quinolones  Review of  Systems Review of Systems  Constitutional:  Negative for fever.  Respiratory:  Positive for cough. Negative for shortness of breath.   Cardiovascular:  Positive for chest pain (due to coughing).   As noted in HPI  Physical Exam Vital Signs  I have reviewed the triage vital signs BP 120/77   Pulse 86   Temp 98.2 F (36.8 C) (Oral)   Resp 18   Ht _0  (1.6 m)   Wt 77.1 kg   SpO2 100%   BMI 30.11 kg/m   Physical Exam Vitals reviewed.  Constitutional:      General: She is not in acute distress.    Appearance: She is well-developed. She is not diaphoretic.  HENT:     Head: Normocephalic and atraumatic.     Nose: Nose normal.  Eyes:     General: No scleral icterus.       Right eye: No discharge.        Left eye: No discharge.     Conjunctiva/sclera: Conjunctivae normal.     Pupils: Pupils are equal, round, and reactive to light.  Cardiovascular:     Rate and Rhythm: Normal rate and regular rhythm.     Heart sounds: No murmur heard.    No friction rub. No gallop.  Pulmonary:     Effort: Pulmonary effort is normal. No respiratory distress.     Breath sounds: Normal breath sounds. No stridor. No rales.  Abdominal:     General: There is no distension.     Palpations: Abdomen is soft.  Tenderness: There is no abdominal tenderness.  Musculoskeletal:        General: No tenderness.     Cervical back: Normal range of motion and neck supple.  Skin:    General: Skin is warm and dry.     Findings: No erythema or rash.  Neurological:     Mental Status: She is alert and oriented to person, place, and time.     ED Results and Treatments Labs (all labs ordered are listed, but only abnormal results are displayed) Labs Reviewed  RESP PANEL BY RT-PCR (RSV, FLU A&B, COVID)  RVPGX2 - Abnormal; Notable for the following components:      Result Value   SARS Coronavirus 2 by RT PCR POSITIVE (*)    All other components within normal limits  BASIC METABOLIC PANEL - Abnormal;  Notable for the following components:   Potassium 3.4 (*)    BUN 5 (*)    All other components within normal limits  CBC  TROPONIN I (HIGH SENSITIVITY)  TROPONIN I (HIGH SENSITIVITY)                                                                                                                         EKG  EKG Interpretation  Date/Time:  Thursday June 09 2022 16:55:23 EST Ventricular Rate:  92 PR Interval:  170 QRS Duration: 84 QT Interval:  374 QTC Calculation: 462 R Axis:   26 Text Interpretation: Normal sinus rhythm No significant change since last tracing Abnormal ECG When compared with ECG of 10-Feb-2022 11:33, PREVIOUS ECG IS PRESENT Confirmed by Addison Lank [phone]) on 06/10/2022 1:37:00 AM       Radiology DG Chest 2 View  Result Date: 06/09/2022 CLINICAL DATA:  Chest pain EXAM: CHEST - 2 VIEW COMPARISON:  02/10/2022 FINDINGS: Left-sided cardiac device remains in place. The heart size and mediastinal contours are within normal limits. Mild scarring or atelectasis at the left lung base. Lungs are otherwise clear. No pleural effusion or pneumothorax. The visualized skeletal structures are unremarkable. IMPRESSION: Mild scarring or atelectasis at the left lung base. Electronically Signed   By: Davina Poke D.O.   On: 06/09/2022 18:11    Medications Ordered in ED Medications  benzonatate (TESSALON) capsule 100 mg (100 mg Oral Not Given 06/10/22 0346)  acetaminophen (TYLENOL) tablet 650 mg (650 mg Oral Given 06/10/22 0346)  dextromethorphan (DELSYM) 30 MG/5ML liquid 15 mg (15 mg Oral Given 06/10/22 0505)  Procedures Procedures  (including critical care time)  Medical Decision Making / ED Course   Medical Decision Making Amount and/or Complexity of Data Reviewed Labs: ordered. Decision-making details documented in ED  Course. Radiology: ordered and independent interpretation performed. Decision-making details documented in ED Course. ECG/medicine tests: ordered and independent interpretation performed. Decision-making details documented in ED Course.  Risk OTC drugs. Prescription drug management.    Patient presents with 4 weeks of cough that appears to be improving.  States that she was diagnosed with bronchitis.  Is having cough associated chest pain.  Differential includes but not limited to pneumonia, pneumothorax, bronchitis, new viral infection.  Lower suspicion for ACS but given patient's past medical history will obtain cardiac workup.  EKG without acute ischemic changes or evidence of pericarditis.  Serial troponins negative x 2. CBC without leukocytosis or anemia. Metabolic panel without significant electrolyte derangements or renal sufficiency. Viral panel positive for COVID-19 Chest x-ray without evidence of pneumonia, pneumothorax, pulmonary edema pleural effusions.  Given tylenol and Delsym. Unsure of when she contracted COVID, so would not start antivirals at this time.  She is well appearing and in no distress. Appropriate for OP management.       Final Clinical Impression(s) / ED Diagnoses Final diagnoses:  [id number] virus infection   The patient appears reasonably screened and/or stabilized for discharge and I doubt any other medical condition or other [facility] requiring further screening, evaluation, or treatment in the ED at this time. I have discussed the findings, Dx and Tx plan with the patient/family who expressed understanding and agree(s) with the plan. Discharge instructions discussed at length. The patient/family was given strict return precautions who verbalized understanding of the instructions. No further questions at time of discharge.  Disposition: Discharge  Condition: Good  ED Discharge Orders          Ordered    Dextromethorphan HBr (DELSYM) 15 MG TABS  4  times daily PRN        06/10/22 4888             Follow Up: Biagio Borg, MD [place]  [id number] [phone]  Call  to schedule an appointment for close follow up           This chart was dictated using voice recognition software.  Despite best efforts to proofread,  errors can occur which can change the documentation meaning.      Fatima Blank, MD 06/10/22 [phone]

## 2022-06-10 NOTE — ED Notes (Signed)
Pt refusing repeat VS at this time

## 2022-06-10 NOTE — ED Notes (Signed)
ED provider at bedside.

## 2022-06-13 ENCOUNTER — Other Ambulatory Visit: Payer: Self-pay | Admitting: Cardiology

## 2022-06-13 ENCOUNTER — Encounter (HOSPITAL_COMMUNITY): Payer: Self-pay | Admitting: Cardiology

## 2022-06-13 MED ORDER — FUROSEMIDE 20 MG PO TABS: 20.0000 mg | ORAL_TABLET | Freq: Every day | ORAL | 1 refills | Status: DC | PRN

## 2022-06-15 ENCOUNTER — Encounter: Payer: Self-pay | Admitting: Internal Medicine

## 2022-06-15 ENCOUNTER — Ambulatory Visit (INDEPENDENT_AMBULATORY_CARE_PROVIDER_SITE_OTHER): Payer: Medicare Other | Admitting: Internal Medicine

## 2022-06-15 VITALS — BP 122/76 | HR 77 | Temp 98.4°F | Ht 63.0 in | Wt 169.0 lb

## 2022-06-15 DIAGNOSIS — E860 Dehydration: Secondary | ICD-10-CM | POA: Insufficient documentation

## 2022-06-15 DIAGNOSIS — U071 COVID-19: Secondary | ICD-10-CM | POA: Diagnosis not present

## 2022-06-15 DIAGNOSIS — R0600 Dyspnea, unspecified: Secondary | ICD-10-CM

## 2022-06-15 DIAGNOSIS — R058 Other specified cough: Secondary | ICD-10-CM | POA: Diagnosis not present

## 2022-06-15 DIAGNOSIS — R110 Nausea: Secondary | ICD-10-CM | POA: Diagnosis not present

## 2022-06-15 MED ORDER — AZITHROMYCIN 250 MG PO TABS: ORAL_TABLET | ORAL | 1 refills | Status: DC

## 2022-06-15 MED ORDER — PROMETHAZINE HCL 25 MG PO TABS: 25.0000 mg | ORAL_TABLET | Freq: Three times a day (TID) | ORAL | 1 refills | Status: DC | PRN

## 2022-06-15 MED ORDER — ONDANSETRON 4 MG PO TBDP: 4.0000 mg | ORAL_TABLET | Freq: Three times a day (TID) | ORAL | 1 refills | Status: DC | PRN

## 2022-06-15 MED ORDER — LEVOFLOXACIN 500 MG PO TABS: 500.0000 mg | ORAL_TABLET | Freq: Every day | ORAL | 0 refills | Status: AC

## 2022-06-15 MED ORDER — PREDNISONE 10 MG PO TABS: ORAL_TABLET | ORAL | 0 refills | Status: DC

## 2022-06-15 NOTE — Progress Notes (Unsigned)
Patient ID: Renee [name], female   DOB: May 31, 1946, 77 y.o.   MRN: [id number]        Chief Complaint: follow up cough, wheezing, dehydration, nausea        HPI:  Renee [name] is a 77 y.o. female here with c/o persistent prod cough and wheezing, with worsening nausea and reducd po intake ; had recent covid infection dec 28 after a prior similar illness non covid from dec 11; was getting better, but now worse again - Here with acute onset mild to mod 2-3 days ST, HA, general weakness and malaise, with prod cough greenish sputum, but Pt denies chest pain, increased sob or doe, wheezing, orthopnea, PND, increased LE swelling, palpitations, dizziness or syncope, except for mild wheezing, sob in the past 2 days.  Declines f/u cxr.  Delsym helps cough well.  Did complete amoxil course but only took 3 days doxy due to onset rash at the natal cleft now essentially resolved.           Wt Readings from Last 3 Encounters:  06/17/22 167 lb (75.8 kg)  06/15/22 169 lb (76.7 kg)  06/10/22 170 lb (77.1 kg)   BP Readings from Last 3 Encounters:  06/17/22 138/80  06/15/22 122/76  06/10/22 120/77         Past Medical History:  Diagnosis Date   Allergy    tape, electrodes   Anxiety    AV nodal re-entry tachycardia    a. s/p RFA 1993- Dr. Rolland Porter, [facility]   Broken neck [facility])    1993   Cervical cancer ([place])    hx of cervical   ca   CHF (congestive heart failure) ([place])    Chronic chest pain    a. 2005 Cath: Mild nonobstructive plaque (30-40% LCX);  b. 08/2011 low risk myoview;  c. 09/2011 Coronary CT: NL cors w ca score of 0.   Chronic pain syndrome    Colon polyps    Complication of anesthesia    blood pressure and heart dropped in 2009     Concussion    1979, 1993   Degenerative joint disease    a. s/p L Total Knee Arthroplasty.   Diet-controlled type 2 diabetes mellitus ([place])    pt. denies   Diverticulosis of colon    Duodenitis without hemorrhage    Dysrhythmia    REPORTS , SINCE PLACEMENT OF  PACEMAKER, STILL CAN FEEL HEART " GOING IN AND OUT OF [facility]" REPORTS, YESTERDAY HER BP SYSTOLIC WAS IN THE 644I AND WAS BREATHLESS, HR 112, SWEATING ; DENIES SYNCOPE  , DENIES CHEST PAIN    Fibromyalgia    GERD (gastroesophageal reflux disease)    Hemorrhoids    Hypertension    Irritable bowel syndrome    Long QT interval    a. mild - advised to avoid meds that may prolong QT.   Myocardial infarction [facility]) 09/1991   does not know when second heart attack was   Neuropathy    Osteoporosis    Paroxysmal A-fib (HCC)    PER PATIENT. I CAN FEEL MY HEART RACING WHEN IM IN IT    Recurrent upper respiratory infection (URI)    SVT (supraventricular tachycardia)    a. nonsustained SVT - previously offered flecainide but refused;  b. 12/2012 30 day event monitor w/o significant arrhythmias.   Thyroid nodule    Past Surgical History:  Procedure Laterality Date   ABLATION OF DYSRHYTHMIC FOCUS  1993   ANTERIOR AND POSTERIOR  REPAIR N/A 08/01/2017   Procedure: ANTERIOR (CYSTOCELE) AND POSTERIOR REPAIR (RECTOCELE);  Surgeon: Bjorn Loser, MD;  Location: [facility];  Service: Urology;  Laterality: N/A;   APPENDECTOMY     1980   BILATERAL SALPINGECTOMY Left 08/01/2017   Procedure: SALPINGECTOMY;  Surgeon: Bobbye Charleston, MD;  Location: [facility];  Service: Gynecology;  Laterality: Left;   BIOPSY THYROID     CARDIAC ELECTROPHYSIOLOGY STUDY AND ABLATION     atrioventricular nodal reentant tachycardia   CHOLECYSTECTOMY  1980   COLONOSCOPY  06/25/2008   Diverticulosis and Hemorrhoids   COLPOSCOPY     CORONARY ANGIOPLASTY  2007   CYSTOSCOPY N/A 08/01/2017   Procedure: CYSTOSCOPY;  Surgeon: Bjorn Loser, MD;  Location: [facility];  Service: Urology;  Laterality: N/A;   EP IMPLANTABLE DEVICE N/A 03/03/2016   Procedure: Pacemaker Implant;  Surgeon: Evans Lance, MD;  Location: [place] [facility];  Service: Cardiovascular;  Laterality: N/A;   ESOPHAGOGASTRODUODENOSCOPY     HYSTEROSCOPY  2002   with  resection of endometrial polyps. by Dr.MCPhail   HYSTEROSCOPY WITH D & C N/A 02/05/2015   Procedure: DILATATION AND CURETTAGE /HYSTEROSCOPY;  Surgeon: Vanessa Kick, MD;  Location: [place] [facility];  Service: Gynecology;  Laterality: N/A;   LOOP RECORDER IMPLANT  08/21/2013   MDT LinQ implanted by Dr Lovena Le for syncope   LOOP RECORDER IMPLANT N/A 08/21/2013   Procedure: LOOP RECORDER IMPLANT;  Surgeon: Evans Lance, MD;  Location: [facility] CATH LAB;  Service: Cardiovascular;  Laterality: N/A;   ORIF RADIAL HEAD / NECK FRACTURE  09/05/88   TILT TABLE STUDY N/A 05/15/2013   Procedure: TILT TABLE STUDY;  Surgeon: Deboraha Sprang, MD;  Location[facility] CATH LAB;  Service: Cardiovascular;  Laterality: N/A;   TOTAL HIP ARTHROPLASTY  10/10   Left, at [place] N/A 08/01/2017   Procedure: HYSTERECTOMY VAGINAL;  Surgeon: Bobbye Charleston, MD;  Location: [facility];  Service: Gynecology;  Laterality: N/A;    reports that she has never smoked. She has never used smokeless tobacco. She reports that she does not currently use alcohol. She reports that she does not use drugs. family history includes Asthma in her mother and sister; Diabetes in her brother, mother, and sister; Heart disease in her sister and sister; Lung cancer in her father; Ovarian cancer in her mother; Stroke in her mother. Allergies  Allergen Reactions   Cyclobenzaprine Other (See Comments)    Pt states that this medication does not work for her.     Azithromycin Other (See Comments)    Cannot take due to prolonged QT   Nitrofurantoin Other (See Comments)   Adhesive [Tape] Itching, Rash and Other (See Comments)    Reaction:  Blisters  Pt states that she is allergic to the adhesive backing of EKG pads if left on for more than 24 hours Patient states she tolerates paper tape ok   Moxifloxacin Other (See Comments)    Unknown allergic reaction many years ago   Quinolones Other (See Comments)     Unknown allergic reaction many years ago   Current Outpatient Medications on File Prior to Visit  Medication Sig Dispense Refill   ACCU-CHEK FASTCLIX LANCETS MISC USE UP TO 4 TIMES DAILY AS  DIRECTED 408 each 1   ACCU-CHEK GUIDE test strip USE UP TO 4 TIMES DAILY AS  DIRECTED 100 each 1   albuterol (VENTOLIN HFA) 108 (90 Base) MCG/ACT inhaler Inhale into the  lungs.     ALPRAZolam (XANAX) 0.5 MG tablet Take 0.5-1 tablets (0.25-0.5 mg total) by mouth daily as needed for anxiety. 90 tablet 1   aspirin EC 81 MG tablet Take 81 mg by mouth daily.     blood glucose meter kit and supplies KIT Dispense per patient and insurance preferred. Use up to four times daily as directed. E11.9 1 each 0   cetirizine (ZYRTEC) 10 MG tablet Take 10 mg by mouth daily as needed.     diltiazem (CARDIZEM CD) 240 MG 24 hr capsule Take 1 capsule (240 mg total) by mouth every morning. 90 capsule 3   diltiazem (CARDIZEM) 30 MG tablet Take 2 tablets (60 mg total) by mouth 4 (four) times daily as needed (as needed for breakthrough heart racing/palpitations). 60 tablet 3   flecainide (TAMBOCOR) 50 MG tablet TAKE 1 TABLET BY MOUTH  TWICE DAILY 200 tablet 2   furosemide (LASIX) 20 MG tablet Take 1 tablet (20 mg total) by mouth daily as needed for fluid. 30 tablet 1   HYDROcodone bit-homatropine (HYCODAN) 5-1.5 MG/5ML syrup Take 5 mLs by mouth every 8 (eight) hours as needed for cough. 120 mL 0   isosorbide mononitrate (IMDUR) 30 MG 24 hr tablet TAKE 1 TABLET BY MOUTH DAILY 100 tablet 2   losartan (COZAAR) 50 MG tablet Take 1 tablet (50 mg total) by mouth 2 (two) times daily. 180 tablet 3   nitroGLYCERIN (NITROSTAT) 0.4 MG SL tablet DISSOLVE 1 TABLET UNDER THE TONGUE EVERY 5 MINUTES AS  NEEDED FOR CHEST PAIN. MAX  OF 3 TABLETS IN 15 MINUTES. CALL 911 IF PAIN PERSISTS. 100 tablet 3   omeprazole (PRILOSEC) 40 MG capsule TAKE 1 CAPSULE BY MOUTH IN  THE MORNING AND AT BEDTIME 180 capsule 3   trimethoprim (TRIMPEX) 100 MG tablet Take 100  mg by mouth daily as needed (For UTI per patient).     zolpidem (AMBIEN) 5 MG tablet TAKE 1 TABLET BY MOUTH AT  BEDTIME AS NEEDED FOR SLEEP 90 tablet 1   No current facility-administered medications on file prior to visit.        ROS:  All others reviewed and negative.  Objective        PE:  BP 122/76 (BP Location: Right Arm, Patient Position: Sitting, Cuff Size: Large)   Pulse 77   Temp 98.4 F (36.9 C) (Oral)   Ht _0  (1.6 m)   Wt 169 lb (76.7 kg)   SpO2 95%   BMI 29.94 kg/m                 Constitutional: Pt appears in NAD, mild ill               HENT: Head: NCAT.                Right Ear: External ear normal.                 Left Ear: External ear normal. Bilat tm's with mild erythema.  Max sinus areas non tender.  Pharynx with mild erythema, no exudate               Eyes: . Pupils are equal, round, and reactive to light. Conjunctivae and EOM are normal               Nose: without d/c or deformity               Neck: Neck supple. Gross normal ROM  Cardiovascular: Normal rate and regular rhythm.                 Pulmonary/Chest: Effort normal and breath sounds without rales with few bilateral wheezing.                Abd:  Soft, NT, ND, + BS, no organomegaly               Neurological: Pt is alert. At baseline orientation, motor grossly intact               Skin: Skin is warm. No rashes, no other new lesions, LE edema - none               Psychiatric: Pt behavior is normal without agitation   Micro: none  Cardiac tracings I have personally interpreted today:  none  Pertinent Radiological findings (summarize): none   Lab Results  Component Value Date   WBC 5.2 06/09/2022   HGB 13.5 06/09/2022   HCT 41.2 06/09/2022   PLT 229 06/09/2022   GLUCOSE 96 06/09/2022   CHOL 162 03/23/2022   TRIG 67.0 03/23/2022   HDL 77.10 03/23/2022   LDLCALC 71 03/23/2022   ALT 15 03/23/2022   AST 16 03/23/2022   NA 136 06/09/2022   K 3.4 (L) 06/09/2022   CL 105  06/09/2022   CREATININE 0.69 06/09/2022   BUN 5 (L) 06/09/2022   CO2 24 06/09/2022   TSH 1.87 03/23/2022   INR 0.9 03/31/2020   HGBA1C 6.1 03/23/2022   MICROALBUR <0.7 03/23/2022   Assessment/Plan:  Renee [name] is a 77 y.o. White or Caucasian [1] female with  has a past medical history of Allergy, Anxiety, AV nodal re-entry tachycardia, Broken neck (HCC), Cervical cancer (HCC), CHF (congestive heart failure) ([place]), Chronic chest pain, Chronic pain syndrome, Colon polyps, Complication of anesthesia, Concussion, Degenerative joint disease, Diet-controlled type 2 diabetes mellitus ([place]), Diverticulosis of colon, Duodenitis without hemorrhage, Dysrhythmia, Fibromyalgia, GERD (gastroesophageal reflux disease), Hemorrhoids, Hypertension, Irritable bowel syndrome, Long QT interval, Myocardial infarction ([place]) (09/1991), Neuropathy, Osteoporosis, Paroxysmal A-fib ([place]), Recurrent upper respiratory infection (URI), SVT (supraventricular tachycardia), and Thyroid nodule.  Cough Mild to mod, c/w bornchitis vs pna, declines f/u cxr, for levaquin asd, continue delsym prn cough,  to f/u any worsening symptoms or concerns   COVID-19 virus infection Resolved, cont delsym prn cough  Dyspnea Mild to mod, for prednisone taper,  to f/u any worsening symptoms or concerns   Dehydration Encourage po intake, now for bmp  Nausea Mild, for phenergan prn,  to f/u any worsening symptoms or concerns  Followup: Return in about 3 months (around 09/22/2022).  Cathlean Cower, MD 06/18/2022 6:01 PM [facility]

## 2022-06-15 NOTE — Assessment & Plan Note (Signed)
Mild to mod, for prednisone taper,  to f/u any worsening symptoms or concerns 

## 2022-06-15 NOTE — Patient Instructions (Addendum)
Please take all new medications - antibiotic,, prednisone, and nausea medication as needed, as well as the Delsym you have OTC  Your COVID testing is negative; you are given the work note  Please continue all other medications  Please go to the LAB for further lab testing  Please return September 22 2022 as planned

## 2022-06-15 NOTE — Assessment & Plan Note (Addendum)
Mild to mod, c/w bornchitis vs pna, declines f/u cxr, for levaquin asd, continue delsym prn cough,  to f/u any worsening symptoms or concerns

## 2022-06-15 NOTE — Assessment & Plan Note (Signed)
Resolved, cont delsym prn cough

## 2022-06-15 NOTE — Assessment & Plan Note (Signed)
Mild, for phenergan prn,  to f/u any worsening symptoms or concerns

## 2022-06-15 NOTE — Progress Notes (Signed)
Patient ID: Renee [name], female   DOB: 10/12/1945, 77 y.o.   MRN: [id number]

## 2022-06-15 NOTE — Assessment & Plan Note (Signed)
Encourage po intake, now for bmp

## 2022-06-15 NOTE — Progress Notes (Deleted)
   Acute Office Visit  Subjective:     Patient ID: Renee [name], female    DOB: 1945-12-09, 77 y.o.   MRN: [id number]  Chief Complaint  Patient presents with   ED follow up    Had covid and is now negative needs to test again in order to return to work on Monday    HPI Patient is in today for ***  ROS      Objective:    BP 122/76 (BP Location: Right Arm, Patient Position: Sitting, Cuff Size: Large)   Pulse 77   Temp 98.4 F (36.9 C) (Oral)   Ht '5\' 3"'$  (1.6 m)   Wt 169 lb (76.7 kg)   SpO2 95%   BMI 29.94 kg/m  {Vitals History (Optional):23777}  Physical Exam  No results found for any visits on 06/15/22.      Assessment & Plan:   Problem List Items Addressed This Visit       High   Dyspnea   Relevant Orders   Basic metabolic panel   CBC with Differential/Platelet   Hepatic function panel     Low   Cough - Primary   Relevant Orders   POC SOFIA Antigen FIA     Unprioritized   COVID-19 virus infection   Relevant Medications   azithromycin (ZITHROMAX) 250 MG tablet    Meds ordered this encounter  Medications   azithromycin (ZITHROMAX) 250 MG tablet    Sig: Take 2 tablets on day 1, then 1 tablet daily on days 2 through 5    Dispense:  6 tablet    Refill:  1   predniSONE (DELTASONE) 10 MG tablet    Sig: 3 tabs by mouth per day for 3 days,2tabs per day for 3 days,1tab per day for 3 days    Dispense:  18 tablet    Refill:  0   ondansetron (ZOFRAN-ODT) 4 MG disintegrating tablet    Sig: Take 1 tablet (4 mg total) by mouth every 8 (eight) hours as needed for nausea or vomiting.    Dispense:  30 tablet    Refill:  1    Return in about 3 months (around 09/22/2022).  Cathlean Cower, MD

## 2022-06-16 ENCOUNTER — Telehealth (HOSPITAL_COMMUNITY): Payer: Self-pay | Admitting: Cardiology

## 2022-06-16 ENCOUNTER — Encounter (HOSPITAL_COMMUNITY): Payer: Self-pay | Admitting: *Deleted

## 2022-06-16 NOTE — Telephone Encounter (Signed)
Message sent to patient via mychart

## 2022-06-16 NOTE — Telephone Encounter (Signed)
Pt would like to make sure its ok to take  -Prednisone taper x3 days -Levaquin 500 mg daily x 10 days As ordered by PCP Was told by pharmacist to not take with long QTC

## 2022-06-16 NOTE — Progress Notes (Signed)
Remote pacemaker transmission.   

## 2022-06-16 NOTE — Telephone Encounter (Signed)
Has history of long QT, would avoid levofloxacin and would ask PCP for another antibiotic, doxycycline would be ok.  She can take prednisone.

## 2022-06-17 ENCOUNTER — Encounter: Payer: Self-pay | Admitting: Internal Medicine

## 2022-06-17 ENCOUNTER — Ambulatory Visit: Payer: Medicare Other | Attending: Internal Medicine | Admitting: Internal Medicine

## 2022-06-17 VITALS — BP 138/80 | HR 66 | Ht 63.0 in | Wt 167.0 lb

## 2022-06-17 DIAGNOSIS — I10 Essential (primary) hypertension: Secondary | ICD-10-CM

## 2022-06-17 DIAGNOSIS — I471 Supraventricular tachycardia, unspecified: Secondary | ICD-10-CM | POA: Diagnosis not present

## 2022-06-17 DIAGNOSIS — Z950 Presence of cardiac pacemaker: Secondary | ICD-10-CM | POA: Diagnosis not present

## 2022-06-17 DIAGNOSIS — R9431 Abnormal electrocardiogram [ECG] [EKG]: Secondary | ICD-10-CM | POA: Diagnosis not present

## 2022-06-17 LAB — CUP PACEART INCLINIC DEVICE CHECK
Date Time Interrogation Session: 20240105170830
Implantable Lead Connection Status: 753985
Implantable Lead Connection Status: 753985
Implantable Lead Implant Date: 20170921
Implantable Lead Implant Date: 20170921
Implantable Lead Location: 753859
Implantable Lead Location: 753860
Implantable Lead Model: 377
Implantable Lead Model: 377
Implantable Lead Serial Number: 49575519
Implantable Lead Serial Number: 49580836
Implantable Pulse Generator Implant Date: 20170921
Pulse Gen Model: 407145
Pulse Gen Serial Number: 68850891
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
## 2022-06-17 NOTE — Patient Instructions (Signed)
Medication Instructions:  Your physician recommends that you continue on your current medications as directed. Please refer to the Current Medication list given to you today.  *If you need a refill on your cardiac medications before your next appointment, please call your pharmacy*  Lab Work: None ordered.  If you have labs (blood work) drawn today and your tests are completely normal, you will receive your results only by: [facility] (if you have MyChart) OR A paper copy in the mail If you have any lab test that is abnormal or we need to change your treatment, we will call you to review the results.  Testing/Procedures: None ordered.  Follow-Up: At [facility], you and your health needs are our priority.  As part of our continuing mission to provide you with exceptional heart care, we have created designated Provider Care Teams.  These Care Teams include your primary Cardiologist (physician) and Advanced Practice Providers (APPs -  Physician Assistants and Nurse Practitioners) who all work together to provide you with the care you need, when you need it.  We recommend signing up for the patient portal called "MyChart".  Sign up information is provided on this After Visit Summary.  MyChart is used to connect with patients for Virtual Visits (Telemedicine).  Patients are able to view lab/test results, encounter notes, upcoming appointments, etc.  Non-urgent messages can be sent to your provider as well.   To learn more about what you can do with MyChart, go to [email or web address].    Your next appointment:   1 year(s)  The format for your next appointment:   In Person  Provider:   Cristopher Peru, MD{or one of the following Advanced Practice Providers on your designated Care Team:   Tommye Standard, [place] Legrand Como "Jonni Sanger" Chalmers Cater, [place]  Remote monitoring is used to monitor your Pacemaker from home. This monitoring reduces the number of office visits required to check your device to  one time per year. It allows [place] to keep an eye on the functioning of your device to ensure it is working properly. You are scheduled for a device check from home on 08/08/22. You may send your transmission at any time that day. If you have a wireless device, the transmission will be sent automatically. After your physician reviews your transmission, you will receive a postcard with your next transmission date.  Important Information About Sugar

## 2022-06-18 ENCOUNTER — Encounter: Payer: Self-pay | Admitting: Internal Medicine

## 2022-06-20 ENCOUNTER — Telehealth: Payer: Self-pay | Admitting: Internal Medicine

## 2022-06-20 LAB — POC SOFIA SARS ANTIGEN FIA: SARS Coronavirus 2 Ag: NEGATIVE

## 2022-06-20 MED ORDER — CEPHALEXIN 500 MG PO CAPS: 500.0000 mg | ORAL_CAPSULE | Freq: Three times a day (TID) | ORAL | 0 refills | Status: DC

## 2022-06-20 NOTE — Telephone Encounter (Signed)
Ok this is done to [facility]

## 2022-06-20 NOTE — Telephone Encounter (Signed)
Pt states she can not take the levofloxacin because she has long QT and can't take doxycycline  because she can't digest it properly.  Pt is requesting a KEFLEX RX be sent to [facility] [id number]   hone: [phone]  Fax: [phone]    Pt: [phone]   Pt states that she hasn't been taking the antibiotics, and is still dealing with upper respiratory symptoms but is still returning to work today.

## 2022-08-01 ENCOUNTER — Telehealth: Payer: Self-pay | Admitting: Internal Medicine

## 2022-08-01 ENCOUNTER — Other Ambulatory Visit (HOSPITAL_COMMUNITY): Payer: Self-pay | Admitting: Cardiology

## 2022-08-01 ENCOUNTER — Ambulatory Visit (INDEPENDENT_AMBULATORY_CARE_PROVIDER_SITE_OTHER): Payer: Medicare Other | Admitting: Internal Medicine

## 2022-08-01 ENCOUNTER — Encounter: Payer: Self-pay | Admitting: Internal Medicine

## 2022-08-01 VITALS — BP 122/68 | HR 72 | Temp 98.1°F | Ht 63.0 in | Wt 174.0 lb

## 2022-08-01 DIAGNOSIS — N644 Mastodynia: Secondary | ICD-10-CM

## 2022-08-01 DIAGNOSIS — R079 Chest pain, unspecified: Secondary | ICD-10-CM

## 2022-08-01 DIAGNOSIS — E559 Vitamin D deficiency, unspecified: Secondary | ICD-10-CM | POA: Diagnosis not present

## 2022-08-01 DIAGNOSIS — G894 Chronic pain syndrome: Secondary | ICD-10-CM

## 2022-08-01 DIAGNOSIS — M47812 Spondylosis without myelopathy or radiculopathy, cervical region: Secondary | ICD-10-CM | POA: Insufficient documentation

## 2022-08-01 DIAGNOSIS — E041 Nontoxic single thyroid nodule: Secondary | ICD-10-CM

## 2022-08-01 NOTE — Assessment & Plan Note (Signed)
I suspect left chest neuritic pain, recent cxr negative, pt declines gabapentin trial

## 2022-08-01 NOTE — Telephone Encounter (Signed)
Patient needing dosage for VitD, I do not see this on her med list. ROV 2/19

## 2022-08-01 NOTE — Assessment & Plan Note (Signed)
Last vitamin D Lab Results  Component Value Date   VD25OH 31.10 03/23/2022   Low, to start  oral replacement

## 2022-08-01 NOTE — Telephone Encounter (Signed)
Patient needs to know how much Vitamin D that Dr. Jenny Reichmann wants her to take daily.  Patient also needs an ultrasound on her thyroid scheduled - she missed the last one

## 2022-08-01 NOTE — Assessment & Plan Note (Signed)
Chronic back pain, declines furhter imaging or tx today

## 2022-08-01 NOTE — Telephone Encounter (Signed)
Notified pt w/ MD response. Pt states she have not been taking any vitamin D. Will start taking 2000 units qd.Marland KitchenJohny [name]

## 2022-08-01 NOTE — Assessment & Plan Note (Signed)
With persistent right nodule - for thyroid u/s reorder and she promises to call back this time

## 2022-08-01 NOTE — Patient Instructions (Addendum)
Please take OTC Vitamin D3 at 2000 units per day, indefinitely  Please continue all other medications as before, and refills have been done if requested.  Please have the pharmacy call with any other refills you may need.  Please continue your efforts at being more active, low cholesterol diet, and weight control.  Please keep your appointments with your specialists as you may have planned  You will be contacted regarding the referral for: Diagnostic Mammogram (at breast center), and the Thyroid Ultrasound (at the [place] office)

## 2022-08-01 NOTE — Assessment & Plan Note (Signed)
Overall I suspect she may have referred neuritic pain due to thoracic spine issue, decline imaging or trial gabapentin - has taken this before and did not like it; bnow for diagnostic mammgoram with breast ultrasound

## 2022-08-01 NOTE — Telephone Encounter (Signed)
Please take OTC Vitamin D3 at 2000 units per day, indefinitely, or 4000 units if you already take the 2000 units.

## 2022-08-01 NOTE — Progress Notes (Signed)
Patient ID: Renee [name], female   DOB: [birth year], 77 y.o.   MRN: [id number]        Chief Complaint: follow up left breast pain, chronic lbp, thyroid nodule, low vit d       HPI:  Renee [name] is a 77 y.o. female here with c/o 1 wk onset left inverted nipple and diffuse breast pain, worse laterally without trauma, swelling, redness or rash. Read some horrible things on the internet and wants further evaluation.  Last screening mammogram nov 2023 negative.   Has long hx of back pain mostly lower back with Le radiation.  Not taking Vit D.  Admits to missing calling back the number left on her phone message to schedule the thyroid ultrasound, needs to be rescheduled         Wt Readings from Last 3 Encounters:  08/01/22 174 lb (78.9 kg)  06/17/22 167 lb (75.8 kg)  06/15/22 169 lb (76.7 kg)   BP Readings from Last 3 Encounters:  08/01/22 122/68  06/17/22 138/80  06/15/22 122/76         Past Medical History:  Diagnosis Date   Allergy    tape, electrodes   Anxiety    AV nodal re-entry tachycardia    a. s/p RFA 1993- Dr. Rolland Porter, [facility]   Broken neck [facility])    1993   Cervical cancer ([place])    hx of cervical   ca   CHF (congestive heart failure) ([place])    Chronic chest pain    a. 2005 Cath: Mild nonobstructive plaque (30-40% LCX);  b. 08/2011 low risk myoview;  c. 09/2011 Coronary CT: NL cors w ca score of 0.   Chronic pain syndrome    Colon polyps    Complication of anesthesia    blood pressure and heart dropped in 2009     Concussion    1979, 1993   Degenerative joint disease    a. s/p L Total Knee Arthroplasty.   Diet-controlled type 2 diabetes mellitus ([place])    pt. denies   Diverticulosis of colon    Duodenitis without hemorrhage    Dysrhythmia    REPORTS , SINCE PLACEMENT OF PACEMAKER, STILL CAN FEEL HEART " GOING IN AND OUT OF [facility]" REPORTS, YESTERDAY HER BP SYSTOLIC WAS IN THE 123456 AND WAS BREATHLESS, HR 112, SWEATING ; DENIES SYNCOPE  , DENIES CHEST PAIN    Fibromyalgia     GERD (gastroesophageal reflux disease)    Hemorrhoids    Hypertension    Irritable bowel syndrome    Long QT interval    a. mild - advised to avoid meds that may prolong QT.   Myocardial infarction [facility]) 09/1991   does not know when second heart attack was   Neuropathy    Osteoporosis    Paroxysmal A-fib (HCC)    PER PATIENT. I CAN FEEL MY HEART RACING WHEN IM IN IT    Recurrent upper respiratory infection (URI)    SVT (supraventricular tachycardia)    a. nonsustained SVT - previously offered flecainide but refused;  b. 12/2012 30 day event monitor w/o significant arrhythmias.   Thyroid nodule    Past Surgical History:  Procedure Laterality Date   ABLATION OF DYSRHYTHMIC FOCUS  1993   ANTERIOR AND POSTERIOR REPAIR N/A 08/01/2017   Procedure: ANTERIOR (CYSTOCELE) AND POSTERIOR REPAIR (RECTOCELE);  Surgeon: Bjorn Loser, MD;  Location: [facility];  Service: Urology;  Laterality: N/A;   APPENDECTOMY     1980  BILATERAL SALPINGECTOMY Left 08/01/2017   Procedure: SALPINGECTOMY;  Surgeon: Bobbye Charleston, MD;  Location: [facility];  Service: Gynecology;  Laterality: Left;   BIOPSY THYROID     CARDIAC ELECTROPHYSIOLOGY STUDY AND ABLATION     atrioventricular nodal reentant tachycardia   CHOLECYSTECTOMY  1980   COLONOSCOPY  06/25/2008   Diverticulosis and Hemorrhoids   COLPOSCOPY     CORONARY ANGIOPLASTY  2007   CYSTOSCOPY N/A 08/01/2017   Procedure: CYSTOSCOPY;  Surgeon: Bjorn Loser, MD;  Location: [facility];  Service: Urology;  Laterality: N/A;   EP IMPLANTABLE DEVICE N/A 03/03/2016   Procedure: Pacemaker Implant;  Surgeon: Evans Lance, MD;  Location: [place] CV LAB;  Service: Cardiovascular;  Laterality: N/A;   ESOPHAGOGASTRODUODENOSCOPY     HYSTEROSCOPY  2002   with resection of endometrial polyps. by Dr.MCPhail   HYSTEROSCOPY WITH D & C N/A 02/05/2015   Procedure: DILATATION AND CURETTAGE /HYSTEROSCOPY;  Surgeon: Vanessa Kick, MD;  Location: [place] [facility];  Service: Gynecology;   Laterality: N/A;   LOOP RECORDER IMPLANT  08/21/2013   MDT LinQ implanted by Dr Lovena Le for syncope   LOOP RECORDER IMPLANT N/A 08/21/2013   Procedure: LOOP RECORDER IMPLANT;  Surgeon: Evans Lance, MD;  Location: [facility] CATH LAB;  Service: Cardiovascular;  Laterality: N/A;   ORIF RADIAL HEAD / NECK FRACTURE  09/05/88   TILT TABLE STUDY N/A 05/15/2013   Procedure: TILT TABLE STUDY;  Surgeon: Deboraha Sprang, MD;  Location: [facility] CATH LAB;  Service: Cardiovascular;  Laterality: N/A;   TOTAL HIP ARTHROPLASTY  10/10   Left, at [place] N/A 08/01/2017   Procedure: HYSTERECTOMY VAGINAL;  Surgeon: Bobbye Charleston, MD;  Location: [facility];  Service: Gynecology;  Laterality: N/A;    reports that she has never smoked. She has never used smokeless tobacco. She reports that she does not currently use alcohol. She reports that she does not use drugs. family history includes Asthma in her mother and sister; Diabetes in her brother, mother, and sister; Heart disease in her sister and sister; Lung cancer in her father; Ovarian cancer in her mother; Stroke in her mother. Allergies  Allergen Reactions   Cyclobenzaprine Other (See Comments)    Pt states that this medication does not work for her.     Azithromycin Other (See Comments)    Cannot take due to prolonged QT   Nitrofurantoin Other (See Comments)   Adhesive [Tape] Itching, Rash and Other (See Comments)    Reaction:  Blisters  Pt states that she is allergic to the adhesive backing of EKG pads if left on for more than 24 hours Patient states she tolerates paper tape ok   Moxifloxacin Other (See Comments)    Unknown allergic reaction many years ago   Quinolones Other (See Comments)    Unknown allergic reaction many years ago   Current Outpatient Medications on File Prior to Visit  Medication Sig Dispense Refill   ACCU-CHEK FASTCLIX LANCETS MISC USE UP TO 4 TIMES DAILY AS  DIRECTED 408 each  1   ACCU-CHEK GUIDE test strip USE UP TO 4 TIMES DAILY AS  DIRECTED 100 each 1   ALPRAZolam (XANAX) 0.5 MG tablet Take 0.5-1 tablets (0.25-0.5 mg total) by mouth daily as needed for anxiety. 90 tablet 1   aspirin EC 81 MG tablet Take 81 mg by mouth daily.     blood glucose meter kit and supplies KIT Dispense per patient  and insurance preferred. Use up to four times daily as directed. E11.9 1 each 0   cephALEXin (KEFLEX) 500 MG capsule Take 1 capsule (500 mg total) by mouth 3 (three) times daily. 30 capsule 0   cetirizine (ZYRTEC) 10 MG tablet Take 10 mg by mouth daily as needed.     diltiazem (CARDIZEM CD) 240 MG 24 hr capsule Take 1 capsule (240 mg total) by mouth every morning. 90 capsule 3   diltiazem (CARDIZEM) 30 MG tablet Take 2 tablets (60 mg total) by mouth 4 (four) times daily as needed (as needed for breakthrough heart racing/palpitations). 60 tablet 3   flecainide (TAMBOCOR) 50 MG tablet TAKE 1 TABLET BY MOUTH  TWICE DAILY 200 tablet 2   furosemide (LASIX) 20 MG tablet Take 1 tablet (20 mg total) by mouth daily as needed for fluid. 30 tablet 1   HYDROcodone bit-homatropine (HYCODAN) 5-1.5 MG/5ML syrup Take 5 mLs by mouth every 8 (eight) hours as needed for cough. 120 mL 0   isosorbide mononitrate (IMDUR) 30 MG 24 hr tablet TAKE 1 TABLET BY MOUTH DAILY 100 tablet 2   losartan (COZAAR) 50 MG tablet Take 1 tablet (50 mg total) by mouth 2 (two) times daily. 180 tablet 3   nitroGLYCERIN (NITROSTAT) 0.4 MG SL tablet DISSOLVE 1 TABLET UNDER THE  TONGUE EVERY 5 MINUTES AS NEEDED FOR CHEST PAIN. MAX OF 3 TABLETS IN 15 MINUTES. CALL 911 IF PAIN  PERSISTS. 125 tablet 2   omeprazole (PRILOSEC) 40 MG capsule TAKE 1 CAPSULE BY MOUTH IN  THE MORNING AND AT BEDTIME 180 capsule 3   predniSONE (DELTASONE) 10 MG tablet 3 tabs by mouth per day for 3 days,2tabs per day for 3 days,1tab per day for 3 days 18 tablet 0   promethazine (PHENERGAN) 25 MG tablet Take 1 tablet (25 mg total) by mouth every 8 (eight)  hours as needed for nausea or vomiting. 30 tablet 1   trimethoprim (TRIMPEX) 100 MG tablet Take 100 mg by mouth daily as needed (For UTI per patient).     zolpidem (AMBIEN) 5 MG tablet TAKE 1 TABLET BY MOUTH AT  BEDTIME AS NEEDED FOR SLEEP 90 tablet 1   No current facility-administered medications on file prior to visit.        ROS:  All others reviewed and negative.  Objective        PE:  BP 122/68 (BP Location: Right Arm, Patient Position: Sitting, Cuff Size: Large)   Pulse 72   Temp 98.1 F (36.7 C) (Oral)   Ht 5' 3"$  (1.6 m)   Wt 174 lb (78.9 kg)   SpO2 95%   BMI 30.82 kg/m                 Constitutional: Pt appears in NAD               HENT: Head: NCAT.                Right Ear: External ear normal.                 Left Ear: External ear normal.                Eyes: . Pupils are equal, round, and reactive to light. Conjunctivae and EOM are normal               Nose: without d/c or deformity  Neck: Neck supple. Gross normal ROM               Cardiovascular: Normal rate and regular rhythm.                 Pulmonary/Chest: Effort normal and breath sounds without rales or wheezing.                Abd:  Soft, NT, ND, + BS, no organomegaly               Neurological: Pt is alert. At baseline orientation, motor grossly intact; diffuse mild midline lumbar tender throughout without swelling or rash, thoracic spine mild tender diffuse as well               Skin: Skin is warm. No rashes, no other new lesions, LE edema - none               Psychiatric: Pt behavior is normal without agitation   Micro: none  Cardiac tracings I have personally interpreted today:  none  Pertinent Radiological findings (summarize): none   Lab Results  Component Value Date   WBC 5.2 06/09/2022   HGB 13.5 06/09/2022   HCT 41.2 06/09/2022   PLT 229 06/09/2022   GLUCOSE 96 06/09/2022   CHOL 162 03/23/2022   TRIG 67.0 03/23/2022   HDL 77.10 03/23/2022   LDLCALC 71 03/23/2022   ALT 15  03/23/2022   AST 16 03/23/2022   NA 136 06/09/2022   K 3.4 (L) 06/09/2022   CL 105 06/09/2022   CREATININE 0.69 06/09/2022   BUN 5 (L) 06/09/2022   CO2 24 06/09/2022   TSH 1.87 03/23/2022   INR 0.9 03/31/2020   HGBA1C 6.1 03/23/2022   MICROALBUR <0.7 03/23/2022   Assessment/Plan:  Renee [name] is a 77 y.o. White or Caucasian [1] female with  has a past medical history of Allergy, Anxiety, AV nodal re-entry tachycardia, Broken neck (HCC), Cervical cancer (HCC), CHF (congestive heart failure) ([place]), Chronic chest pain, Chronic pain syndrome, Colon polyps, Complication of anesthesia, Concussion, Degenerative joint disease, Diet-controlled type 2 diabetes mellitus ([place]), Diverticulosis of colon, Duodenitis without hemorrhage, Dysrhythmia, Fibromyalgia, GERD (gastroesophageal reflux disease), Hemorrhoids, Hypertension, Irritable bowel syndrome, Long QT interval, Myocardial infarction ([place]) (09/1991), Neuropathy, Osteoporosis, Paroxysmal A-fib ([place]), Recurrent upper respiratory infection (URI), SVT (supraventricular tachycardia), and Thyroid nodule.  Vitamin D deficiency Last vitamin D Lab Results  Component Value Date   VD25OH 31.10 03/23/2022   Low, to start  oral replacement  Thyroid nodule With persistent right nodule - for thyroid u/s reorder and she promises to call back this time  Breast pain, left Overall I suspect she may have referred neuritic pain due to thoracic spine issue, decline imaging or trial gabapentin - has taken this before and did not like it; bnow for diagnostic mammgoram with breast ultrasound  Chest pain I suspect left chest neuritic pain, recent cxr negative, pt declines gabapentin trial  Chronic pain syndrome Chronic back pain, declines furhter imaging or tx today  Followup: Return if symptoms worsen or fail to improve.  Cathlean Cower, MD 08/01/2022 7:34 PM [facility]

## 2022-08-02 ENCOUNTER — Other Ambulatory Visit (HOSPITAL_COMMUNITY): Payer: Self-pay | Admitting: Cardiology

## 2022-08-04 DIAGNOSIS — Z0000 Encounter for general adult medical examination without abnormal findings: Secondary | ICD-10-CM | POA: Diagnosis not present

## 2022-08-08 ENCOUNTER — Ambulatory Visit: Payer: Medicare Other

## 2022-08-08 DIAGNOSIS — R55 Syncope and collapse: Secondary | ICD-10-CM

## 2022-08-08 DIAGNOSIS — I471 Supraventricular tachycardia, unspecified: Secondary | ICD-10-CM

## 2022-08-08 LAB — CUP PACEART REMOTE DEVICE CHECK
Battery Voltage: 55
Date Time Interrogation Session: 20240226092106
Implantable Lead Connection Status: 753985
Implantable Lead Connection Status: 753985
Implantable Lead Implant Date: 20170921
Implantable Lead Implant Date: 20170921
Implantable Lead Location: 753859
Implantable Lead Location: 753860
Implantable Lead Model: 377
Implantable Lead Model: 377
Implantable Lead Serial Number: 49575519
Implantable Lead Serial Number: 49580836
Implantable Pulse Generator Implant Date: 20170921
Pulse Gen Model: 407145
Pulse Gen Serial Number: 68850891

## 2022-08-23 ENCOUNTER — Encounter: Payer: Self-pay | Admitting: Internal Medicine

## 2022-08-23 DIAGNOSIS — B349 Viral infection, unspecified: Secondary | ICD-10-CM | POA: Diagnosis not present

## 2022-08-23 DIAGNOSIS — Z20822 Contact with and (suspected) exposure to covid-19: Secondary | ICD-10-CM | POA: Diagnosis not present

## 2022-08-23 DIAGNOSIS — M545 Low back pain, unspecified: Secondary | ICD-10-CM | POA: Diagnosis not present

## 2022-08-23 DIAGNOSIS — Z7982 Long term (current) use of aspirin: Secondary | ICD-10-CM | POA: Diagnosis not present

## 2022-08-23 DIAGNOSIS — I2510 Atherosclerotic heart disease of native coronary artery without angina pectoris: Secondary | ICD-10-CM | POA: Diagnosis not present

## 2022-08-23 DIAGNOSIS — I10 Essential (primary) hypertension: Secondary | ICD-10-CM | POA: Diagnosis not present

## 2022-08-23 DIAGNOSIS — Z79899 Other long term (current) drug therapy: Secondary | ICD-10-CM | POA: Diagnosis not present

## 2022-08-23 DIAGNOSIS — Z1152 Encounter for screening for COVID-19: Secondary | ICD-10-CM | POA: Diagnosis not present

## 2022-08-24 ENCOUNTER — Telehealth: Payer: Self-pay

## 2022-08-24 NOTE — Telephone Encounter (Signed)
Pt called per message received in [place].   Pt stated she was recently seen in the [facility] Emergency Department, and stated she was diagnosed with a viral illness, and it is not Covid.    Pt was advised by ER provider to take Tylenol to treat her symptoms.  Pt states she was also advised to schedule an appointment with [facility] as soon as possible for her chest discomfort, and to see Dr. Lovena Le.    Pt had a difficult time listening / answering questions regarding her ER visit, and it was easy to assess, she is ill, and not feeling well.  Pt was told that I will forward her request to scheduling for a heartcare appointment, and consult device team on assessing a transmission, if possible.    =======================  Addendum:  Per Device Team RN, Pt had recent device check on 06/17/2022, and 08/08/2022.  Pt Bio PPM was functioning WNL.    Pt called back and offered a morning and afternoon appointment with Ms. Tommye Standard PA-C on 08/26/2022, to assess her chest tightness, check her device, and follow up as ordered by ER provider, Pt REFUSED this appointment.  Pt stated if she cannot see Dr. Lovena Le, she does not want to see his PA-C.    I ended the call ensuring the Pt that is care about her chest tightness concern, and since she is unwilling to see Ms. Renee PA-C, I will forward an ASAP request to scheduling as promised, and as originally stated.

## 2022-08-24 NOTE — Telephone Encounter (Signed)
Pt called back waiting for response, she states she is still not feeling well and needs to know what to do

## 2022-08-26 ENCOUNTER — Ambulatory Visit: Payer: Medicare Other | Attending: Physician Assistant | Admitting: Physician Assistant

## 2022-08-26 ENCOUNTER — Encounter: Payer: Self-pay | Admitting: Physician Assistant

## 2022-08-26 VITALS — BP 136/70 | HR 68 | Ht 63.0 in | Wt 169.0 lb

## 2022-08-26 DIAGNOSIS — R079 Chest pain, unspecified: Secondary | ICD-10-CM | POA: Diagnosis not present

## 2022-08-26 DIAGNOSIS — I5032 Chronic diastolic (congestive) heart failure: Secondary | ICD-10-CM

## 2022-08-26 DIAGNOSIS — I739 Peripheral vascular disease, unspecified: Secondary | ICD-10-CM

## 2022-08-26 DIAGNOSIS — Z950 Presence of cardiac pacemaker: Secondary | ICD-10-CM | POA: Diagnosis not present

## 2022-08-26 LAB — CUP PACEART INCLINIC DEVICE CHECK
Date Time Interrogation Session: 20240315122457
Implantable Lead Connection Status: 753985
Implantable Lead Connection Status: 753985
Implantable Lead Implant Date: 20170921
Implantable Lead Implant Date: 20170921
Implantable Lead Location: 753859
Implantable Lead Location: 753860
Implantable Lead Model: 377
Implantable Lead Model: 377
Implantable Lead Serial Number: 49575519
Implantable Lead Serial Number: 49580836
Implantable Pulse Generator Implant Date: 20170921
Lead Channel Pacing Threshold Amplitude: 0.5 V
Lead Channel Pacing Threshold Amplitude: 1 V
Lead Channel Pacing Threshold Pulse Width: 0.4 ms
Lead Channel Pacing Threshold Pulse Width: 0.4 ms
Lead Channel Sensing Intrinsic Amplitude: 3.3 mV
Lead Channel Sensing Intrinsic Amplitude: 8.6 mV
Pulse Gen Model: 407145
Pulse Gen Serial Number: 68850891

## 2022-08-26 MED ORDER — FUROSEMIDE 20 MG PO TABS: 20.0000 mg | ORAL_TABLET | Freq: Every day | ORAL | 0 refills | Status: DC | PRN

## 2022-08-26 MED ORDER — DILTIAZEM HCL 30 MG PO TABS: 60.0000 mg | ORAL_TABLET | Freq: Four times a day (QID) | ORAL | 0 refills | Status: AC | PRN

## 2022-08-26 NOTE — Patient Instructions (Addendum)
Medication Instructions:   Your physician recommends that you continue on your current medications as directed. Please refer to the Current Medication list given to you today.  *If you need a refill on your cardiac medications before your next appointment, please call your pharmacy*   Lab Work: [place]   If you have labs (blood work) drawn today and your tests are completely normal, you will receive your results only by: [place] (if you have MyChart) OR A paper copy in the mail If you have any lab test that is abnormal or we need to change your treatment, we will call you to review the results.   Testing/Procedures: Your physician has requested that you have an ankle brachial index (ABI). During this test an ultrasound and blood pressure cuff are used to evaluate the arteries that supply the arms and legs with blood. Allow thirty minutes for this exam. There are no restrictions or special instructions.   Follow-Up: At [facility], you and your health needs are our priority.  As part of our continuing mission to provide you with exceptional heart care, we have created designated Provider Care Teams.  These Care Teams include your primary Cardiologist (physician) and Advanced Practice Providers (APPs -  Physician Assistants and Nurse Practitioners) who all work together to provide you with the care you need, when you need it.  We recommend signing up for the patient portal called "MyChart".  Sign up information is provided on this After Visit Summary.  MyChart is used to connect with patients for Virtual Visits (Telemedicine).  Patients are able to view lab/test results, encounter notes, upcoming appointments, etc.  Non-urgent messages can be sent to your provider as well.   To learn more about what you can do with MyChart, go to [email or web address].    Your next appointment:  ONE MONTH WITH [facility] /APP   1 year(s)  Provider:   You may see  Dr. Lovena Le or  one of the following Advanced Practice Providers on your designated Care Team:   Tommye Standard, [place] Legrand Como "Jonni Sanger" Chalmers Cater, [place]  Other Instructions

## 2022-08-26 NOTE — Progress Notes (Signed)
Cardiology Office Note Date:  08/26/2022  Patient ID:  Renee [name], Zechman 1945-08-22, MRN [id number] PCP:  Biagio Borg, MD  Electrophysiologist: Dr. Lovena Le    Chief Complaint:  CP  History of Present Illness: Renee [name] is a 77 y.o. female with history of syncope/autonomic dysfunction, PPM, PVCs, chronic CHF (diastolic), CHB ? Long QT (this is unclear) AFib is mentioned as well in her chart (described as short episodes only via device)  She saw Dr. Aundra Dubin last 01/07/22, had been to the ER feeling weak, this was ongoing, she was feeling poorly, continued with severe episodes of CP that s/l NTG has helps on occasion, + SOB Discussed microvessel disease Planned to update her echo and coronary CT to evaluate for any macrovessel disease  She saw Dr. Lovena Le 06/17/22, discussed significant symptoms with VP and programmed AAIR doing much better. C/o weak legs but also sedentary. Recently had COVID but better, described occasional non-cardiac CP Doing well on low dose flecainide for her PVCs No changes were made.  08/23/22: ER visit with body aches, cough, diarrhea.  No worrisome findings, felt to be viral syndrome, COVID/resp panel neg, discharged with instructions for supportive home care K+ 3.3 BUN/Creat 9/0.60 WBC 5.9 H/H 12/37 Plts 226  TODAY She feels terrible She starts today by reporting that she is a 50 year cardiac patient and needs to be taken seriously. She recalls long ago the events that led to her ablation many years ago and how traumatic that was. She reports that she had a deadly viral illness a week or two ago, was in so much pain all over she could hardly stand it. Could feel all of her organs aching. Finally getting some recovery from that, but not to baseline.  She has terrible chest pains, describes a few L lateral thorax/breast pain, this is tenderness L lateral thorax/chest pain that radiates/wraps inferior to her breast towards center More new is a R  lateral thorax/chest pain that radiate/wraps forward under her breast towards center. A low center chest/epigastric pain that radiates b/l towards the sides. Sometimes and more of late with any of these are involving pain to both arms  None are particularly triggered, there is no pattern, no clear associated symptoms they are typically short, lasting moments, fast stabs  Some degree of chronic SOB as well as terrible leg weakness, just has no strength in her legs, and is certain this is because her blood doesn't circulate through her veins well. No pain, but has some numbness to b/l feet Asks to have her circulation checked  She is tired all of the time, she works 4 days a week,  answers to [employer]. The other 3 days she is so tired she is essentially worthless.  No syncope No symptoms of tachycardia/her SVT, palpitations   Device information Biotronik dual chamber PPM implanted 03/03/2016   Past Medical History:  Diagnosis Date   Allergy    tape, electrodes   Anxiety    AV nodal re-entry tachycardia    a. s/p RFA 1993- Dr. Rolland Porter, [facility]   Broken neck [facility])    1993   Cervical cancer [facility])    hx of cervical   ca   CHF (congestive heart failure) ([place])    Chronic chest pain    a. 2005 Cath: Mild nonobstructive plaque (30-40% LCX);  b. 08/2011 low risk myoview;  c. 09/2011 Coronary CT: NL cors w ca score of 0.   Chronic pain  syndrome    Colon polyps    Complication of anesthesia    blood pressure and heart dropped in 2009     Concussion    1979, 1993   Degenerative joint disease    a. s/p L Total Knee Arthroplasty.   Diet-controlled type 2 diabetes mellitus ([place])    pt. denies   Diverticulosis of colon    Duodenitis without hemorrhage    Dysrhythmia    REPORTS , SINCE PLACEMENT OF PACEMAKER, STILL CAN FEEL HEART " GOING IN AND OUT OF [facility]" REPORTS, YESTERDAY HER BP SYSTOLIC WAS IN THE 123456 AND WAS BREATHLESS, HR 112, SWEATING ; DENIES SYNCOPE  , DENIES CHEST  PAIN    Fibromyalgia    GERD (gastroesophageal reflux disease)    Hemorrhoids    Hypertension    Irritable bowel syndrome    Long QT interval    a. mild - advised to avoid meds that may prolong QT.   Myocardial infarction [facility]) 09/1991   does not know when second heart attack was   Neuropathy    Osteoporosis    Paroxysmal A-fib (HCC)    PER PATIENT. I CAN FEEL MY HEART RACING WHEN IM IN IT    Recurrent upper respiratory infection (URI)    SVT (supraventricular tachycardia)    a. nonsustained SVT - previously offered flecainide but refused;  b. 12/2012 30 day event monitor w/o significant arrhythmias.   Thyroid nodule     Past Surgical History:  Procedure Laterality Date   ABLATION OF DYSRHYTHMIC FOCUS  1993   ANTERIOR AND POSTERIOR REPAIR N/A 08/01/2017   Procedure: ANTERIOR (CYSTOCELE) AND POSTERIOR REPAIR (RECTOCELE);  Surgeon: Bjorn Loser, MD;  Location: [facility];  Service: Urology;  Laterality: N/A;   APPENDECTOMY     1980   BILATERAL SALPINGECTOMY Left 08/01/2017   Procedure: SALPINGECTOMY;  Surgeon: Bobbye Charleston, MD;  Location: [facility];  Service: Gynecology;  Laterality: Left;   BIOPSY THYROID     CARDIAC ELECTROPHYSIOLOGY STUDY AND ABLATION     atrioventricular nodal reentant tachycardia   CHOLECYSTECTOMY  1980   COLONOSCOPY  06/25/2008   Diverticulosis and Hemorrhoids   COLPOSCOPY     CORONARY ANGIOPLASTY  2007   CYSTOSCOPY N/A 08/01/2017   Procedure: CYSTOSCOPY;  Surgeon: Bjorn Loser, MD;  Location: [facility];  Service: Urology;  Laterality: N/A;   EP IMPLANTABLE DEVICE N/A 03/03/2016   Procedure: Pacemaker Implant;  Surgeon: Evans Lance, MD;  Location: [place] CV LAB;  Service: Cardiovascular;  Laterality: N/A;   ESOPHAGOGASTRODUODENOSCOPY     HYSTEROSCOPY  2002   with resection of endometrial polyps. by Dr.MCPhail   HYSTEROSCOPY WITH D & C N/A 02/05/2015   Procedure: DILATATION AND CURETTAGE /HYSTEROSCOPY;  Surgeon: Vanessa Kick, MD;  Location: [place]  ORS;  Service: Gynecology;  Laterality: N/A;   LOOP RECORDER IMPLANT  08/21/2013   MDT LinQ implanted by Dr Lovena Le for syncope   LOOP RECORDER IMPLANT N/A 08/21/2013   Procedure: LOOP RECORDER IMPLANT;  Surgeon: Evans Lance, MD;  Location: [facility] CATH LAB;  Service: Cardiovascular;  Laterality: N/A;   ORIF RADIAL HEAD / NECK FRACTURE  09/05/88   TILT TABLE STUDY N/A 05/15/2013   Procedure: TILT TABLE STUDY;  Surgeon: Deboraha Sprang, MD;  Location: [facility] CATH LAB;  Service: Cardiovascular;  Laterality: N/A;   TOTAL HIP ARTHROPLASTY  10/10   Left, at Max N/A 08/01/2017   Procedure: HYSTERECTOMY  VAGINAL;  Surgeon: Bobbye Charleston, MD;  Location: [facility];  Service: Gynecology;  Laterality: N/A;    Current Outpatient Medications  Medication Sig Dispense Refill   ACCU-CHEK FASTCLIX LANCETS MISC USE UP TO 4 TIMES DAILY AS  DIRECTED 408 each 1   ACCU-CHEK GUIDE test strip USE UP TO 4 TIMES DAILY AS  DIRECTED 100 each 1   ALPRAZolam (XANAX) 0.5 MG tablet Take 0.5-1 tablets (0.25-0.5 mg total) by mouth daily as needed for anxiety. 90 tablet 1   aspirin EC 81 MG tablet Take 81 mg by mouth daily.     blood glucose meter kit and supplies KIT Dispense per patient and insurance preferred. Use up to four times daily as directed. E11.9 1 each 0   cephALEXin (KEFLEX) 500 MG capsule Take 1 capsule (500 mg total) by mouth 3 (three) times daily. 30 capsule 0   cetirizine (ZYRTEC) 10 MG tablet Take 10 mg by mouth daily as needed.     diltiazem (CARDIZEM CD) 240 MG 24 hr capsule TAKE 1 CAPSULE BY MOUTH IN THE  MORNING 100 capsule 2   diltiazem (CARDIZEM) 30 MG tablet Take 2 tablets (60 mg total) by mouth 4 (four) times daily as needed (as needed for breakthrough heart racing/palpitations). 60 tablet 3   flecainide (TAMBOCOR) 50 MG tablet TAKE 1 TABLET BY MOUTH  TWICE DAILY 200 tablet 2   furosemide (LASIX) 20 MG tablet Take 1 tablet (20 mg total) by  mouth daily as needed for fluid. 30 tablet 1   HYDROcodone bit-homatropine (HYCODAN) 5-1.5 MG/5ML syrup Take 5 mLs by mouth every 8 (eight) hours as needed for cough. 120 mL 0   isosorbide mononitrate (IMDUR) 30 MG 24 hr tablet TAKE 1 TABLET BY MOUTH DAILY 100 tablet 2   losartan (COZAAR) 50 MG tablet Take 1 tablet (50 mg total) by mouth 2 (two) times daily. 180 tablet 3   nitroGLYCERIN (NITROSTAT) 0.4 MG SL tablet DISSOLVE 1 TABLET UNDER THE  TONGUE EVERY 5 MINUTES AS NEEDED FOR CHEST PAIN. MAX OF 3 TABLETS IN 15 MINUTES. CALL 911 IF PAIN  PERSISTS. 125 tablet 2   omeprazole (PRILOSEC) 40 MG capsule TAKE 1 CAPSULE BY MOUTH IN  THE MORNING AND AT BEDTIME 180 capsule 3   predniSONE (DELTASONE) 10 MG tablet 3 tabs by mouth per day for 3 days,2tabs per day for 3 days,1tab per day for 3 days 18 tablet 0   promethazine (PHENERGAN) 25 MG tablet Take 1 tablet (25 mg total) by mouth every 8 (eight) hours as needed for nausea or vomiting. 30 tablet 1   trimethoprim (TRIMPEX) 100 MG tablet Take 100 mg by mouth daily as needed (For UTI per patient).     zolpidem (AMBIEN) 5 MG tablet TAKE 1 TABLET BY MOUTH AT  BEDTIME AS NEEDED FOR SLEEP 90 tablet 1   No current facility-administered medications for this visit.    Allergies:   Cyclobenzaprine, Azithromycin, Nitrofurantoin, Adhesive [tape], Moxifloxacin, and Quinolones   Social History:  The patient  reports that she has never smoked. She has never used smokeless tobacco. She reports that she does not currently use alcohol. She reports that she does not use drugs.   Family History:  The patient's family history includes Asthma in her mother and sister; Diabetes in her brother, mother, and sister; Heart disease in her sister and sister; Lung cancer in her father; Ovarian cancer in her mother; Stroke in her mother.  ROS:  Please see the history of present  illness.    All other systems are reviewed and otherwise negative.   PHYSICAL EXAM:  VS:  There were no  vitals taken for this visit. BMI: There is no height or weight on file to calculate BMI. Well nourished, well developed, in no acute distress HEENT: normocephalic, atraumatic Neck: no JVD, carotid bruits or masses Cardiac:  RRR; no significant murmurs, no rubs, or gallops Lungs:  CTA b/l, no wheezing, rhonchi or rales Abd: soft, nontender MS: no deformity or atrophy Ext: no edema Skin: warm and dry, no rash Neuro:  No gross deficits appreciated Psych: euthymic mood, full affect  PPM site is stable, no tethering or discomfort  EKG:  Done today and reviewed by myself shows  SR 68bpm, stable intervals, no acute/ischemic changes, QTc 431ms  Device interrogation done today and reviewed by myself:  Battery and lead measurements are good She has 12 episodes, logged as AT Last was 915/23 A rate 150s, longest 3 min 14 seconds  02/10/22: coronary CT IMPRESSION: 1. Minimal CAD in LAD, <25% stenosis, CADRADS 1. 2. Coronary calcium score of 0. 3. Normal coronary origins with right dominance.   RECOMMENDATIONS: CAD-RADS 0. No evidence of CAD (0%). Consider non-atherosclerotic causes of chest pain.   01/17/22: TTE 1. Left ventricular ejection fraction, by estimation, is 55 to 60%. The  left ventricle has normal function. The left ventricle has no regional  wall motion abnormalities. Left ventricular diastolic parameters are  consistent with Grade I diastolic  dysfunction (impaired relaxation).   2. Right ventricular systolic function is normal. The right ventricular  size is normal. Mildly increased right ventricular wall thickness. There  is normal pulmonary artery systolic pressure.   3. The mitral valve is normal in structure. Trivial mitral valve  regurgitation. No evidence of mitral stenosis.   4. Tricuspid valve regurgitation is mild to moderate.   5. The aortic valve is tricuspid. There is mild thickening of the aortic  valve. Aortic valve regurgitation is not visualized. No  aortic stenosis is  present.   6. The inferior vena cava is normal in size with greater than 50%  respiratory variability, suggesting right atrial pressure of 3 mmHg.   Comparison(s): No significant change from prior study.    03/01/21: CPX Conclusion: The interpretation of this test is limited due to submaximal effort during the exercise. Based on available data, exercise testing with gas exchange demonstrates normal (actually excellent) functional capacity when compared to matched sedentary norms. There is no clear cardiopulmonary limitation. Patient is achieving ventilatory limits and appears with hyperventilation at peak exercise. Patient was educated on safe exercises to improve exercise intolerance, pending cardiologist clearance.  Attending: Submax test. However still with excellent functional capacity. Patient stopped exercising due to reaching her ventilatory limits in the setting of significant hyperventilatory response to exercise.   Recent Labs: 03/23/2022: ALT 15; Magnesium 1.9; TSH 1.87 06/09/2022: BUN 5; Creatinine, Ser 0.69; Hemoglobin 13.5; Platelets 229; Potassium 3.4; Sodium 136  03/23/2022: Cholesterol 162; HDL 77.10; LDL Cholesterol 71; Total CHOL/HDL Ratio 2; Triglycerides 67.0; VLDL 13.4   CrCl cannot be calculated (Patient's most recent lab result is older than the maximum 21 days allowed.).   Wt Readings from Last 3 Encounters:  08/01/22 174 lb (78.9 kg)  06/17/22 167 lb (75.8 kg)  06/15/22 169 lb (76.7 kg)     Other studies reviewed: Additional studies/records reviewed today include: summarized above  ASSESSMENT AND PLAN:  PPM Intact function No programming changes made Indication was CHB, though she  was symptomatic with V pacing and programmed AAIR  CP All sounds quite atypical though she is very certain that it is cardiac Reviewed her last echo and CTa She is not taking the Imdur, worried about having circulating nitroglycerine in her arteries "all of  the time" She asks why she can't see Dr. Aundra Dubin, he knows her well I advised her to take the daily Imdur and monitor her symptoms Will have her f/u with Dr. Eulis Manly in a month or so  3. Long QT is mentioned in her chart Looks ok  4. AFib is mentioned  Brief ST/AT 150's only (No V lead data) None noted on her device Not on a/c follow  5. Chronic CHF (diastolic) She does not appear volume OL   Disposition: F/u with remotes as usual, in clinic with EP again in a year, sooner if needed.  Current medicines are reviewed at length with the patient today.  The patient did not have any concerns regarding medicines.  Venetia Night, PA-C 08/26/2022 6:59 AM     [facility] [address] [place] [address] [phone] (office)  [phone] (fax)

## 2022-08-30 ENCOUNTER — Ambulatory Visit
Admission: RE | Admit: 2022-08-30 | Discharge: 2022-08-30 | Disposition: A | Discharge status: Home / Self Care | Payer: Medicare Other | Source: Ambulatory Visit | Attending: Internal Medicine | Admitting: Internal Medicine

## 2022-08-30 ENCOUNTER — Other Ambulatory Visit: Payer: Medicare Other

## 2022-08-30 DIAGNOSIS — E041 Nontoxic single thyroid nodule: Secondary | ICD-10-CM

## 2022-08-30 DIAGNOSIS — E042 Nontoxic multinodular goiter: Secondary | ICD-10-CM | POA: Diagnosis not present

## 2022-09-03 ENCOUNTER — Emergency Department (HOSPITAL_COMMUNITY): Payer: Medicare Other

## 2022-09-03 ENCOUNTER — Other Ambulatory Visit: Payer: Self-pay

## 2022-09-03 ENCOUNTER — Encounter (HOSPITAL_COMMUNITY): Payer: Self-pay

## 2022-09-03 ENCOUNTER — Emergency Department (HOSPITAL_COMMUNITY)
Admission: EM | Admit: 2022-09-03 | Discharge: 2022-09-03 | Disposition: A | Discharge status: Home / Self Care | Payer: Medicare Other | Attending: Emergency Medicine | Admitting: Emergency Medicine

## 2022-09-03 ENCOUNTER — Telehealth: Payer: Self-pay | Admitting: Family Medicine

## 2022-09-03 DIAGNOSIS — R1110 Vomiting, unspecified: Secondary | ICD-10-CM | POA: Diagnosis not present

## 2022-09-03 DIAGNOSIS — R197 Diarrhea, unspecified: Secondary | ICD-10-CM | POA: Diagnosis not present

## 2022-09-03 DIAGNOSIS — R110 Nausea: Secondary | ICD-10-CM | POA: Diagnosis not present

## 2022-09-03 DIAGNOSIS — R109 Unspecified abdominal pain: Secondary | ICD-10-CM | POA: Diagnosis not present

## 2022-09-03 DIAGNOSIS — E860 Dehydration: Secondary | ICD-10-CM

## 2022-09-03 DIAGNOSIS — Z8541 Personal history of malignant neoplasm of cervix uteri: Secondary | ICD-10-CM | POA: Insufficient documentation

## 2022-09-03 DIAGNOSIS — Z7982 Long term (current) use of aspirin: Secondary | ICD-10-CM | POA: Diagnosis not present

## 2022-09-03 DIAGNOSIS — Z950 Presence of cardiac pacemaker: Secondary | ICD-10-CM | POA: Diagnosis not present

## 2022-09-03 DIAGNOSIS — I509 Heart failure, unspecified: Secondary | ICD-10-CM | POA: Insufficient documentation

## 2022-09-03 DIAGNOSIS — R6889 Other general symptoms and signs: Secondary | ICD-10-CM | POA: Diagnosis not present

## 2022-09-03 DIAGNOSIS — I700 Atherosclerosis of aorta: Secondary | ICD-10-CM | POA: Diagnosis not present

## 2022-09-03 DIAGNOSIS — R112 Nausea with vomiting, unspecified: Secondary | ICD-10-CM | POA: Diagnosis present

## 2022-09-03 DIAGNOSIS — R1111 Vomiting without nausea: Secondary | ICD-10-CM | POA: Diagnosis not present

## 2022-09-03 DIAGNOSIS — Z743 Need for continuous supervision: Secondary | ICD-10-CM | POA: Diagnosis not present

## 2022-09-03 LAB — URINALYSIS, W/ REFLEX TO CULTURE (INFECTION SUSPECTED)
Bacteria, UA: NONE SEEN
Bilirubin Urine: NEGATIVE
Glucose, UA: NEGATIVE mg/dL
Ketones, ur: NEGATIVE mg/dL
Nitrite: NEGATIVE
Protein, ur: NEGATIVE mg/dL
Specific Gravity, Urine: 1.01 (ref 1.005–1.030)
pH: 7 (ref 5.0–8.0)

## 2022-09-03 LAB — COMPREHENSIVE METABOLIC PANEL
ALT: 18 U/L (ref 0–44)
AST: 18 U/L (ref 15–41)
Albumin: 3.8 g/dL (ref 3.5–5.0)
Alkaline Phosphatase: 96 U/L (ref 38–126)
Anion gap: 10 (ref 5–15)
BUN: 7 mg/dL — ABNORMAL LOW (ref 8–23)
CO2: 22 mmol/L (ref 22–32)
Calcium: 9.3 mg/dL (ref 8.9–10.3)
Chloride: 106 mmol/L (ref 98–111)
Creatinine, Ser: 0.72 mg/dL (ref 0.44–1.00)
GFR, Estimated: >60 mL/min (ref >60)
Glucose, Bld: 121 mg/dL — ABNORMAL HIGH (ref 70–99)
Potassium: 3.7 mmol/L (ref 3.5–5.1)
Sodium: 138 mmol/L (ref 135–145)
Total Bilirubin: 0.9 mg/dL (ref 0.3–1.2)
Total Protein: 6.7 g/dL (ref 6.5–8.1)

## 2022-09-03 LAB — CBC WITH DIFFERENTIAL/PLATELET
Abs Immature Granulocytes: 0.03 10*3/uL (ref 0.00–0.07)
Basophils Absolute: 0 10*3/uL (ref 0.0–0.1)
Basophils Relative: 0 %
Eosinophils Absolute: 0 10*3/uL (ref 0.0–0.5)
Eosinophils Relative: 0 %
HCT: 41.8 % (ref 36.0–46.0)
Hemoglobin: 13.2 g/dL (ref 12.0–15.0)
Immature Granulocytes: 0 %
Lymphocytes Relative: 13 %
Lymphs Abs: 1.7 10*3/uL (ref 0.7–4.0)
MCH: 26 pg (ref 26.0–34.0)
MCHC: 31.6 g/dL (ref 30.0–36.0)
MCV: 82.4 fL (ref 80.0–100.0)
Monocytes Absolute: 0.5 10*3/uL (ref 0.1–1.0)
Monocytes Relative: 4 %
Neutro Abs: 10.9 10*3/uL — ABNORMAL HIGH (ref 1.7–7.7)
Neutrophils Relative %: 83 %
Platelets: 278 10*3/uL (ref 150–400)
RBC: 5.07 MIL/uL (ref 3.87–5.11)
RDW: 13.6 % (ref 11.5–15.5)
WBC: 13.1 10*3/uL — ABNORMAL HIGH (ref 4.0–10.5)
nRBC: 0 % (ref 0.0–0.2)

## 2022-09-03 LAB — MAGNESIUM: Magnesium: 1.7 mg/dL (ref 1.7–2.4)

## 2022-09-03 MED ORDER — MORPHINE SULFATE (PF) 4 MG/ML IV SOLN
4.0000 mg | Freq: Once | INTRAVENOUS | Status: AC
  Administered 2022-09-03: 4 mg via INTRAVENOUS
  Filled 2022-09-03: qty 1

## 2022-09-03 MED ORDER — LOPERAMIDE HCL 2 MG PO CAPS
4.0000 mg | ORAL_CAPSULE | Freq: Once | ORAL | Status: DC
  Filled 2022-09-03: qty 2

## 2022-09-03 MED ORDER — SODIUM CHLORIDE 0.9 % IV SOLN
12.5000 mg | Freq: Once | INTRAVENOUS | Status: AC
  Administered 2022-09-03: 12.5 mg via INTRAVENOUS
  Filled 2022-09-03: qty 0.5

## 2022-09-03 MED ORDER — IOHEXOL 350 MG/ML SOLN
75.0000 mL | Freq: Once | INTRAVENOUS | Status: AC | PRN
  Administered 2022-09-03: 75 mL via INTRAVENOUS

## 2022-09-03 MED ORDER — AMOXICILLIN-POT CLAVULANATE 875-125 MG PO TABS: 1.0000 | ORAL_TABLET | Freq: Two times a day (BID) | ORAL | 0 refills | Status: DC

## 2022-09-03 MED ORDER — SODIUM CHLORIDE 0.9 % IV BOLUS
1000.0000 mL | Freq: Once | INTRAVENOUS | Status: AC
  Administered 2022-09-03: 1000 mL via INTRAVENOUS

## 2022-09-03 MED ORDER — ALIGN 4 MG PO CAPS: 1.0000 | ORAL_CAPSULE | Freq: Every day | ORAL | 0 refills | Status: DC

## 2022-09-03 MED ORDER — PROMETHAZINE HCL 12.5 MG PO TABS: 12.6000 mg | ORAL_TABLET | Freq: Four times a day (QID) | ORAL | 0 refills | Status: DC | PRN

## 2022-09-03 NOTE — ED Notes (Signed)
Pt moved from hall 20 to room 35.

## 2022-09-03 NOTE — ED Provider Notes (Signed)
[facility] Provider Note  CSN: [id number] Arrival date & time: 09/03/22 1056  Chief Complaint(s) Vomiting and Diarrhea  HPI Renee [name] is a 77 y.o. female with history of CHF, complete heart block status post pacemaker, possible prolonged QT presenting to the emergency department with nausea vomiting and diarrhea.  She reports that she has had this since yesterday.  She reports that a week ago she had nausea vomiting and diarrhea, went to an outside ER and was told she had a virus.  She was feeling better until yesterday when it got worse.  No bloody stool or melena.  No hematemesis.  She reports total body pain, throughout her entire body, worse in her abdomen.  No fevers or chills.  She received antiemetics with EMS and reports her nausea is improved.  Denies any recent antibiotic use, recent travel.   Past Medical History Past Medical History:  Diagnosis Date   Allergy    tape, electrodes   Anxiety    AV nodal re-entry tachycardia    a. s/p RFA 1993- Dr. Rolland Porter, [facility]   Broken neck [facility])    1993   Cervical cancer [facility])    hx of cervical   ca   CHF (congestive heart failure) ([place])    Chronic chest pain    a. 2005 Cath: Mild nonobstructive plaque (30-40% LCX);  b. 08/2011 low risk myoview;  c. 09/2011 Coronary CT: NL cors w ca score of 0.   Chronic pain syndrome    Colon polyps    Complication of anesthesia    blood pressure and heart dropped in 2009     Concussion    1979, 1993   Degenerative joint disease    a. s/p L Total Knee Arthroplasty.   Diet-controlled type 2 diabetes mellitus ([place])    pt. denies   Diverticulosis of colon    Duodenitis without hemorrhage    Dysrhythmia    REPORTS , SINCE PLACEMENT OF PACEMAKER, STILL CAN FEEL HEART " GOING IN AND OUT OF RYTHYN" REPORTS, YESTERDAY HER BP SYSTOLIC WAS IN THE 123456 AND WAS BREATHLESS, HR 112, SWEATING ; DENIES SYNCOPE  , DENIES CHEST PAIN    Fibromyalgia    GERD  (gastroesophageal reflux disease)    Hemorrhoids    Hypertension    Irritable bowel syndrome    Long QT interval    a. mild - advised to avoid meds that may prolong QT.   Myocardial infarction [facility]) 09/1991   does not know when second heart attack was   Neuropathy    Osteoporosis    Paroxysmal A-fib (HCC)    PER PATIENT. I CAN FEEL MY HEART RACING WHEN IM IN IT    Recurrent upper respiratory infection (URI)    SVT (supraventricular tachycardia)    a. nonsustained SVT - previously offered flecainide but refused;  b. 12/2012 30 day event monitor w/o significant arrhythmias.   Thyroid nodule    Patient Active Problem List   Diagnosis Date Noted   Cervical spondylosis 08/01/2022   Vitamin D deficiency 08/01/2022   Breast pain, left 08/01/2022   SVT (supraventricular tachycardia) 06/17/2022   Long QT interval 06/17/2022   COVID-19 virus infection 06/15/2022   Dehydration 06/15/2022   Nausea 06/15/2022   Encounter for well adult exam with abnormal findings 03/23/2022   Chronic congestive heart failure ([place]) 03/23/2022   Pain in joint of right elbow 12/15/2021   Shoulder pain, right 12/15/2021   Post-traumatic osteoarthritis  12/15/2021   Cervical intraepithelial neoplasia grade 1 09/02/2020   Menopausal syndrome 09/02/2020   Overactive bladder 09/02/2020   Postmenopausal bleeding 09/02/2020   Left hip pain 09/02/2020   Cellulitis 08/21/2020   Recurrent UTI 07/21/2020   Multiple thyroid nodules 01/14/2020   Epigastric pain 12/10/2019   Atypical chest pain 12/10/2019   PVC's (premature ventricular contractions) 08/06/2019   Hoarseness 07/16/2019   Sinus node dysfunction (HCC) 03/11/2019   Chronic cough 03/05/2019   Urinary retention 03/05/2019   Generalized weakness 09/11/2018   Dysuria 05/09/2018   Acute sinus infection 04/20/2018   Other allergic rhinitis 04/20/2018   Cystocele, midline 08/01/2017   Hyperglycemia 01/31/2017   Bilateral foot pain 01/31/2017    Laryngopharyngeal reflux (LPR) 08/18/2016   Globus pharyngeus 08/01/2016   Thyroid nodule 08/01/2016   Cough 06/24/2016   Viral URI 05/17/2016   Syncope 03/03/2016   Pacemaker 03/03/2016   Chest pain at rest 09/13/2015   Spinal stenosis of lumbar region 09/07/2015   Raynaud phenomenon 09/07/2015   Insomnia 09/07/2015   Preventative health care 12/18/2014   Paroxysmal spells 01/28/2014   Dysthymia 01/28/2014   Injury of leg 10/07/2013   Headache(784.0) 03/14/2013   Eye problem 02/15/2013   Persistent headaches 02/15/2013   Pre-syncope 10/04/2011   NSVT (nonsustained ventricular tachycardia) ([place]) 10/04/2011   Dyspnea 10/01/2009   Diverticulosis of large intestine 09/24/2009   SWEATING 09/24/2009   FATIQUE AND MALAISE 09/16/2009   LONG QT SYNDROME 03/19/2009   AV NODAL REENTRY TACHYCARDIA 10/03/2008   Other dysphagia 06/17/2008   HEMORRHOIDS 06/16/2008   DUODENITIS, WITHOUT HEMORRHAGE 06/16/2008   CONSTIPATION, CHRONIC 06/16/2008   THYROID NODULE, right lobe. 05/26/2008   Chronic pain syndrome 05/26/2008   Coronary atherosclerosis 05/26/2008   BRONCHITIS, RECURRENT 05/26/2008   Irritable bowel syndrome 05/26/2008   FECAL INCONTINENCE 05/26/2008   Anxiety state 03/24/2008   Essential hypertension 03/24/2008   GERD 03/24/2008   DEGENERATIVE JOINT DISEASE 03/24/2008   BACK PAIN, LUMBAR 03/24/2008   Fibromyalgia 03/24/2008   Chest pain 03/24/2008   NEUROPATHY, HX OF 03/24/2008   Home Medication(s) Prior to Admission medications   Medication Sig Start Date End Date Taking? Authorizing Provider  ACCU-CHEK FASTCLIX LANCETS MISC USE UP TO 4 TIMES DAILY AS  DIRECTED 04/11/18   Biagio Borg, MD  ACCU-CHEK GUIDE test strip USE UP TO 4 TIMES DAILY AS  DIRECTED 04/11/18   Biagio Borg, MD  ALPRAZolam Duanne Moron) 0.5 MG tablet Take 0.5-1 tablets (0.25-0.5 mg total) by mouth daily as needed for anxiety. 03/23/22   Biagio Borg, MD  aspirin EC 81 MG tablet Take 81 mg by mouth daily.     [provider]  blood glucose meter kit and supplies KIT Dispense per patient and insurance preferred. Use up to four times daily as directed. E11.9 02/21/18   Biagio Borg, MD  cetirizine (ZYRTEC) 10 MG tablet Take 10 mg by mouth daily as needed.    [provider]  diltiazem (CARDIZEM CD) 240 MG 24 hr capsule TAKE 1 CAPSULE BY MOUTH IN THE  MORNING 08/03/22   Larey Dresser, MD  diltiazem (CARDIZEM) 30 MG tablet Take 2 tablets (60 mg total) by mouth 4 (four) times daily as needed (as needed for breakthrough heart racing/palpitations). 08/26/22   Baldwin Jamaica, PA-C  flecainide (TAMBOCOR) 50 MG tablet TAKE 1 TABLET BY MOUTH  TWICE DAILY 02/22/22   Larey Dresser, MD  furosemide (LASIX) 20 MG tablet Take 1 tablet (20 mg  total) by mouth daily as needed for fluid. 08/26/22   Baldwin Jamaica, PA-C  isosorbide mononitrate (IMDUR) 30 MG 24 hr tablet TAKE 1 TABLET BY MOUTH DAILY 05/17/22   Larey Dresser, MD  losartan (COZAAR) 50 MG tablet Take 1 tablet (50 mg total) by mouth 2 (two) times daily. 11/03/21   Larey Dresser, MD  nitroGLYCERIN (NITROSTAT) 0.4 MG SL tablet DISSOLVE 1 TABLET UNDER THE  TONGUE EVERY 5 MINUTES AS NEEDED FOR CHEST PAIN. MAX OF 3 TABLETS IN 15 MINUTES. CALL 911 IF PAIN  PERSISTS. 08/01/22   Larey Dresser, MD  omeprazole (PRILOSEC) 40 MG capsule TAKE 1 CAPSULE BY MOUTH IN  THE MORNING AND AT BEDTIME 11/15/21   Ladene Artist, MD  trimethoprim (TRIMPEX) 100 MG tablet Take 100 mg by mouth daily as needed (For UTI per patient). 06/24/20   [provider]  zolpidem (AMBIEN) 5 MG tablet TAKE 1 TABLET BY MOUTH AT  BEDTIME AS NEEDED FOR SLEEP 12/22/20   Biagio Borg, MD                                                                                                                                    Past Surgical History Past Surgical History:  Procedure Laterality Date   ABLATION OF DYSRHYTHMIC FOCUS  1993   ANTERIOR AND POSTERIOR REPAIR N/A  08/01/2017   Procedure: ANTERIOR (CYSTOCELE) AND POSTERIOR REPAIR (RECTOCELE);  Surgeon: Bjorn Loser, MD;  Location: [facility];  Service: Urology;  Laterality: N/A;   APPENDECTOMY     1980   BILATERAL SALPINGECTOMY Left 08/01/2017   Procedure: SALPINGECTOMY;  Surgeon: Bobbye Charleston, MD;  Location: [facility];  Service: Gynecology;  Laterality: Left;   BIOPSY THYROID     CARDIAC ELECTROPHYSIOLOGY STUDY AND ABLATION     atrioventricular nodal reentant tachycardia   CHOLECYSTECTOMY  1980   COLONOSCOPY  06/25/2008   Diverticulosis and Hemorrhoids   COLPOSCOPY     CORONARY ANGIOPLASTY  2007   CYSTOSCOPY N/A 08/01/2017   Procedure: CYSTOSCOPY;  Surgeon: Bjorn Loser, MD;  Location: [facility];  Service: Urology;  Laterality: N/A;   EP IMPLANTABLE DEVICE N/A 03/03/2016   Procedure: Pacemaker Implant;  Surgeon: Evans Lance, MD;  Location: [facility];  Service: Cardiovascular;  Laterality: N/A;   ESOPHAGOGASTRODUODENOSCOPY     HYSTEROSCOPY  2002   with resection of endometrial polyps. by Dr.MCPhail   HYSTEROSCOPY WITH D & C N/A 02/05/2015   Procedure: DILATATION AND CURETTAGE /HYSTEROSCOPY;  Surgeon: Vanessa Kick, MD;  Location: [facility];  Service: Gynecology;  Laterality: N/A;   LOOP RECORDER IMPLANT  08/21/2013   MDT LinQ implanted by Dr Lovena Le for syncope   LOOP RECORDER IMPLANT N/A 08/21/2013   Procedure: LOOP RECORDER IMPLANT;  Surgeon: Evans Lance, MD;  Location[facility];  Service: Cardiovascular;  Laterality: N/A;   ORIF RADIAL HEAD / NECK FRACTURE  09/05/88   TILT TABLE STUDY N/A 05/15/2013   Procedure: TILT TABLE STUDY;  Surgeon: Deboraha Sprang, MD;  Location: [facility] C[facility] LAB;  Service: Cardiovascular;  Laterality: N/A;   TOTAL HIP ARTHROPLASTY  10/10   Left, at [place] N/A 08/01/2017   Procedure: HYSTERECTOMY VAGINAL;  Surgeon: Bobbye Charleston, MD;  Location: [facility];  Service: Gynecology;  Laterality: N/A;    Family History Family History  Problem Relation Age of Onset   Ovarian cancer Mother    Stroke Mother    Diabetes Mother    Asthma Mother    Lung cancer Father        Heart problems   Heart disease Sister    Diabetes Brother    Diabetes Sister        Pacemaker, CHF   Heart disease Sister    Asthma Sister    Colon cancer Neg Hx    Esophageal cancer Neg Hx    Stomach cancer Neg Hx    Rectal cancer Neg Hx     Social History Social History   Tobacco Use   Smoking status: Never   Smokeless tobacco: Never  Vaping Use   Vaping Use: Never used  Substance Use Topics   Alcohol use: Not Currently    Comment: Occasional    Drug use: No   Allergies Cyclobenzaprine, Azithromycin, Nitrofurantoin, Adhesive [tape], Moxifloxacin, and Quinolones  Review of Systems Review of Systems  All other systems reviewed and are negative.   Physical Exam Vital Signs  I have reviewed the triage vital signs BP 139/85   Pulse 94   Temp 98.4 F (36.9 C) (Oral)   Resp 20   Ht 5\' 3"  (1.6 m)   Wt 75 kg   SpO2 94%   BMI 29.29 kg/m  Physical Exam Vitals and nursing note reviewed.  Constitutional:      General: She is not in acute distress.    Appearance: She is well-developed.  HENT:     Head: Normocephalic and atraumatic.     Mouth/Throat:     Mouth: Mucous membranes are dry.  Eyes:     Pupils: Pupils are equal, round, and reactive to light.  Cardiovascular:     Rate and Rhythm: Normal rate and regular rhythm.     Heart sounds: No murmur heard. Pulmonary:     Effort: Pulmonary effort is normal. No respiratory distress.     Breath sounds: Normal breath sounds.  Abdominal:     General: Abdomen is flat.     Palpations: Abdomen is soft.     Tenderness: There is abdominal tenderness (diffusely).  Musculoskeletal:        General: No tenderness.     Right lower leg: No edema.     Left lower leg: No edema.  Skin:    General: Skin is warm and dry.  Neurological:     General: No  focal deficit present.     Mental Status: She is alert. Mental status is at baseline.  Psychiatric:        Mood and Affect: Mood normal.        Behavior: Behavior normal.     ED Results and Treatments Labs (all labs ordered are listed, but only abnormal results are displayed) Labs Reviewed  COMPREHENSIVE METABOLIC PANEL - Abnormal; Notable for the following components:      Result Value   Glucose, Bld 121 (*)    BUN  7 (*)    All other components within normal limits  CBC WITH DIFFERENTIAL/PLATELET - Abnormal; Notable for the following components:   WBC 13.1 (*)    Neutro Abs 10.9 (*)    All other components within normal limits  URINALYSIS, W/ REFLEX TO CULTURE (INFECTION SUSPECTED) - Abnormal; Notable for the following components:   APPearance HAZY (*)    Hgb urine dipstick SMALL (*)    Leukocytes,Ua MODERATE (*)    All other components within normal limits  C DIFFICILE QUICK SCREEN W PCR REFLEX    GASTROINTESTINAL PANEL BY PCR, STOOL (REPLACES STOOL CULTURE)  MAGNESIUM                                                                                                                          Radiology No results found.  Pertinent labs & imaging results that were available during my care of the patient were reviewed by me and considered in my medical decision making (see MDM for details).  Medications Ordered in ED Medications  loperamide (IMODIUM) capsule 4 mg (0 mg Oral Hold 09/03/22 1417)  sodium chloride 0.9 % bolus 1,000 mL (1,000 mLs Intravenous New Bag/Given 09/03/22 1418)  promethazine (PHENERGAN) 12.5 mg in sodium chloride 0.9 % 50 mL IVPB (0 mg Intravenous Stopped 09/03/22 1444)  morphine (PF) 4 MG/ML injection 4 mg (4 mg Intravenous Given 09/03/22 1418)                                                                                                                                     Procedures Procedures  (including critical care time)  Medical Decision Making /  ED Course   MDM:  77 year old female presenting to the emergency department with nausea vomiting and diarrhea.  Patient well-appearing, does have some dry mucous membranes and diffuse abdominal tenderness, suspect mild dehydration.  Differential includes gastroenteritis, colitis, C. difficile, viral syndrome.  No bloody stool to suggest invasive diarrhea.  Lower concern for acute surgical pathology such as appendicitis, cholecystitis, obstruction or perforation but given pain and age will obtain CT scan to further evaluate.  Ordered Imodium which patient refused.  Patient received Phenergan and IV fluids, morphine.  Mild improvement.  Pending CT scan.  Signed out pending CT scan.      Additional history obtained: -Additional history obtained from ems -External records from outside source obtained and reviewed including: Chart  review including previous notes, labs, imaging, consultation notes including recent cardiology note   Lab Tests: -I ordered, reviewed, and interpreted labs.   The pertinent results include:   Labs Reviewed  COMPREHENSIVE METABOLIC PANEL - Abnormal; Notable for the following components:      Result Value   Glucose, Bld 121 (*)    BUN 7 (*)    All other components within normal limits  CBC WITH DIFFERENTIAL/PLATELET - Abnormal; Notable for the following components:   WBC 13.1 (*)    Neutro Abs 10.9 (*)    All other components within normal limits  URINALYSIS, W/ REFLEX TO CULTURE (INFECTION SUSPECTED) - Abnormal; Notable for the following components:   APPearance HAZY (*)    Hgb urine dipstick SMALL (*)    Leukocytes,Ua MODERATE (*)    All other components within normal limits  C DIFFICILE QUICK SCREEN W PCR REFLEX    GASTROINTESTINAL PANEL BY PCR, STOOL (REPLACES STOOL CULTURE)  MAGNESIUM    Notable for mild leukocytosis, normal renal function  EKG   EKG Interpretation  Date/Time:    Ventricular Rate:    PR Interval:    QRS Duration:   QT  Interval:    QTC Calculation:   R Axis:     Text Interpretation:           Medicines ordered and prescription drug management: Meds ordered this encounter  Medications   sodium chloride 0.9 % bolus 1,000 mL   promethazine (PHENERGAN) 12.5 mg in sodium chloride 0.9 % 50 mL IVPB   morphine (PF) 4 MG/ML injection 4 mg   loperamide (IMODIUM) capsule 4 mg    -I have reviewed the patients home medicines and have made adjustments as needed  Cardiac Monitoring: The patient was maintained on a cardiac monitor.  I personally viewed and interpreted the cardiac monitored which showed an underlying rhythm of: NSR  Social Determinants of Health:  Diagnosis or treatment significantly limited by social determinants of health: obesity   Reevaluation: After the interventions noted above, I reevaluated the patient and found that their symptoms have improved  Co morbidities that complicate the patient evaluation  Past Medical History:  Diagnosis Date   Allergy    tape, electrodes   Anxiety    AV nodal re-entry tachycardia    a. s/p RFA 1993- Dr. Rolland Porter, [facility]   Broken neck [facility])    1993   Cervical cancer ([place])    hx of cervical   ca   CHF (congestive heart failure) ([place])    Chronic chest pain    a. 2005 Cath: Mild nonobstructive plaque (30-40% LCX);  b. 08/2011 low risk myoview;  c. 09/2011 Coronary CT: NL cors w ca score of 0.   Chronic pain syndrome    Colon polyps    Complication of anesthesia    blood pressure and heart dropped in 2009     Concussion    1979, 1993   Degenerative joint disease    a. s/p L Total Knee Arthroplasty.   Diet-controlled type 2 diabetes mellitus ([place])    pt. denies   Diverticulosis of colon    Duodenitis without hemorrhage    Dysrhythmia    REPORTS , SINCE PLACEMENT OF PACEMAKER, STILL CAN FEEL HEART " GOING IN AND OUT OF RYTHYN" REPORTS, YESTERDAY HER BP SYSTOLIC WAS IN THE 123456 AND WAS BREATHLESS, HR 112, SWEATING ; DENIES SYNCOPE  , DENIES CHEST  PAIN    Fibromyalgia    GERD (gastroesophageal reflux  disease)    Hemorrhoids    Hypertension    Irritable bowel syndrome    Long QT interval    a. mild - advised to avoid meds that may prolong QT.   Myocardial infarction [facility]) 09/1991   does not know when second heart attack was   Neuropathy    Osteoporosis    Paroxysmal A-fib (HCC)    PER PATIENT. I CAN FEEL MY HEART RACING WHEN IM IN IT    Recurrent upper respiratory infection (URI)    SVT (supraventricular tachycardia)    a. nonsustained SVT - previously offered flecainide but refused;  b. 12/2012 30 day event monitor w/o significant arrhythmias.   Thyroid nodule       Dispostion: Disposition decision including need for hospitalization was considered, and patient disposition pending at time of sign out.    Final Clinical Impression(s) / ED Diagnoses Final diagnoses:  Diarrhea, unspecified type     This chart was dictated using voice recognition software.  Despite best efforts to proofread,  errors can occur which can change the documentation meaning.    Cristie Hem, MD 09/03/22 [phone]

## 2022-09-03 NOTE — ED Notes (Signed)
Vomiting yellow bile like material.  And was incontinent of diarrhea.

## 2022-09-03 NOTE — ED Notes (Signed)
Refuses to leave bp cuff on, refuses to have side rail of stretcher up, states she needs to be able to get up when she needs to.

## 2022-09-03 NOTE — ED Triage Notes (Signed)
BIBA from home, vomiting and diarrhea started approx 0500 this am.  Given 1L NS and phenergan 12.5mg  IV enroute.  Nausea has resolved

## 2022-09-03 NOTE — ED Provider Notes (Signed)
Pt signed out by Dr. Truett Mainland pending CT abd/pelvis.  CT reviewed by me.  I agree with the radiologist.  Stable exam.  No acute findings within the abdomen or pelvis.    Aortic Atherosclerosis (ICD10-I70.0).    Pt is stable for d/c home.  She is upset we are not keeping her in the hospital because she lives alone.  However, there is no indication for admission.  As sx have been going on for several days, I will d/c her with augmentin in case there is a bacterial etiology for her diarrhea (she is cipro allergic).  Pt is to use probiotics.  She is also given a list of foods good for diarrhea.  Pt is stable for d/c.  She is to return if worse.  F/u with pcp.   Isla Pence, MD 09/03/22 [phone]

## 2022-09-03 NOTE — Telephone Encounter (Signed)
Received a call from the triage nurse. She had been contacted by [name], who was at the ED, having been discharged after an evaluation for vomiting and diarrhea. [name] insisted she be able to speak with the on-call physician for Renee [name].  I called [name] and confirmed her identity. She noted she has been sick for several weeks with diarrhea and vomiting. She disagreed with being discharged from the ED. She feels she needs admission to the hospital. She states the staff did not review any discharge instructions with her. She notes she was given a prescription for amoxicillin and doubts this will work. She is also upset that they are giving her this without knowing what is causing her issue. She states she has thrown up on the floor next to her chair in the waiting room. She states she will be lodging a large complaint against [facility].  I reviewed the ED record, vital signs, lab reports, and CT results from her visit today. Her CT was unremarkable. Her vitals were normal. Her labs showed a mildly elevated WBC with a left shift. Other labs were unremarkable, stool studies were collected and results were pending. She was given IV fluids, morphine, antiemetics, and antidiarrheal agents. She was prescribed Augmentin in case her diarrhea turned out to be a bacterial infection. Augmentin was chosen, as she has an allergy to Cipro. She also has a long QT that limits the use of certain medications.  I asked her what I could do to help her. She said I could probable do nothing, but wanted to voice her plans to file a complaint against the hospital. I did review the discharge meds with her and explained about choosing an empiric therapy while awaiting the results of the stool studies. She then noted her daughter had arrived to pick her up and she abruptly ended the call.  Haydee Salter, MD

## 2022-09-04 ENCOUNTER — Telehealth: Payer: Self-pay

## 2022-09-04 NOTE — Telephone Encounter (Signed)
Pharmacy ( [facility]) called just to double check the prescription of promethazine, as it can cause arrhythmias. Messaged Dr. Purcell Mouton . She stated it was fine to proceed with prescription. This RNCM called Kennyth Lose at [place] to confirm the perscription

## 2022-09-06 ENCOUNTER — Telehealth: Payer: Self-pay | Admitting: Internal Medicine

## 2022-09-06 MED ORDER — ONDANSETRON 4 MG PO TBDP: 4.0000 mg | ORAL_TABLET | Freq: Three times a day (TID) | ORAL | 0 refills | Status: DC | PRN

## 2022-09-06 NOTE — Telephone Encounter (Signed)
Patient was recently in the hospital from extreme vomiting. She said she did not have a good experience there and they did not help her. She would like to know if Dr. Jenny Reichmann can recommend or prescribe anything to help her. When asked to make an appointment, she declined. She said she felt terrible and did not think she would be able to drive. She would like a call back at [phone].

## 2022-09-06 NOTE — Telephone Encounter (Signed)
Please advise, patient declined ov also

## 2022-09-06 NOTE — Telephone Encounter (Signed)
Ok for zofran prn - done erx 

## 2022-09-07 ENCOUNTER — Other Ambulatory Visit: Payer: Self-pay | Admitting: Physician Assistant

## 2022-09-07 DIAGNOSIS — I739 Peripheral vascular disease, unspecified: Secondary | ICD-10-CM

## 2022-09-07 NOTE — Telephone Encounter (Signed)
Pt states she can't take that med due to ger Q10 Heart. She states she will just deal with it will not pick zofran up.Marland KitchenJohny [name]

## 2022-09-07 NOTE — Telephone Encounter (Signed)
I dont know what q10 Heart is but we can discuss at next visit I believe

## 2022-09-08 NOTE — Telephone Encounter (Signed)
Noted../lmb 

## 2022-09-09 ENCOUNTER — Other Ambulatory Visit: Payer: Self-pay | Admitting: Physician Assistant

## 2022-09-09 DIAGNOSIS — I739 Peripheral vascular disease, unspecified: Secondary | ICD-10-CM

## 2022-09-13 ENCOUNTER — Ambulatory Visit (HOSPITAL_COMMUNITY)
Admission: RE | Admit: 2022-09-13 | Discharge: 2022-09-13 | Disposition: A | Discharge status: Home / Self Care | Payer: Medicare Other | Source: Ambulatory Visit | Attending: Cardiology | Admitting: Cardiology

## 2022-09-13 DIAGNOSIS — I739 Peripheral vascular disease, unspecified: Secondary | ICD-10-CM | POA: Insufficient documentation

## 2022-09-15 LAB — VAS US ABI WITH/WO TBI
Left ABI: 1.3
Right ABI: 1.26

## 2022-09-15 NOTE — Progress Notes (Signed)
Remote pacemaker transmission.   

## 2022-09-22 ENCOUNTER — Ambulatory Visit: Payer: Medicare Other | Admitting: Internal Medicine

## 2022-09-22 ENCOUNTER — Ambulatory Visit (INDEPENDENT_AMBULATORY_CARE_PROVIDER_SITE_OTHER): Payer: Medicare Other

## 2022-09-22 ENCOUNTER — Telehealth: Payer: Self-pay | Admitting: Internal Medicine

## 2022-09-22 VITALS — BP 116/60 | HR 81 | Temp 98.3°F | Ht 63.0 in | Wt 171.8 lb

## 2022-09-22 DIAGNOSIS — Z0000 Encounter for general adult medical examination without abnormal findings: Secondary | ICD-10-CM

## 2022-09-22 NOTE — Telephone Encounter (Signed)
Pt called back per message received in [facility] Triage.    Pt made aware that Ms. Renee Butte PA-C read her VAS [place] report and stated:  Normal findings, nothing to suggest any vascular disease in her legs   Pt appreciated the call, and verbalized understanding of the report.
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
## 2022-09-22 NOTE — Telephone Encounter (Signed)
Patient called stating someone from our office called her however, they did not leave a voicemail message. Will forward to MD and nurse.

## 2022-09-22 NOTE — Telephone Encounter (Signed)
Patient stated she is returning RN's call. 

## 2022-09-22 NOTE — Patient Instructions (Addendum)
Renee [name] , Thank you for taking time to come for your Medicare Wellness Visit. I appreciate your ongoing commitment to your health goals. Please review the following plan we discussed and let me know if I can assist you in the future.   These are the goals we discussed:  Goals      Client understands the importance of follow-up with providers by attending scheduled visits        This is a list of the screening recommended for you and due dates:  Health Maintenance  Topic Date Due   Eye exam for diabetics  05/03/2022   Hemoglobin A1C  09/22/2022   Zoster (Shingles) Vaccine (1 of 2) 10/30/2022*   Flu Shot  01/12/2023   Yearly kidney health urinalysis for diabetes  03/24/2023   Complete foot exam   03/24/2023   Yearly kidney function blood test for diabetes  09/03/2023   Medicare Annual Wellness Visit  09/22/2023   DTaP/Tdap/Td vaccine (2 - Td or Tdap) 09/29/2023   Colon Cancer Screening  08/14/2025   Pneumonia Vaccine  Completed   DEXA scan (bone density measurement)  Completed   Hepatitis C Screening: USPSTF Recommendation to screen - Ages 43-77 yo.  Completed   HPV Vaccine  Aged Out   COVID-19 Vaccine  Discontinued  *Topic was postponed. The date shown is not the original due date.    Advanced directives: No  Conditions/risks identified: Yes  Next appointment: Follow up in one year for your annual wellness visit.   Preventive Care 77 Years and Older, Female Preventive care refers to lifestyle choices and visits with your health care provider that can promote health and wellness. What does preventive care include? A yearly physical exam. This is also called an annual well check. Dental exams once or twice a year. Routine eye exams. Ask your health care provider how often you should have your eyes checked. Personal lifestyle choices, including: Daily care of your teeth and gums. Regular physical activity. Eating a healthy diet. Avoiding tobacco and drug use. Limiting  alcohol use. Practicing safe sex. Taking low-dose aspirin every day. Taking vitamin and mineral supplements as recommended by your health care provider. What happens during an annual well check? The services and screenings done by your health care provider during your annual well check will depend on your age, overall health, lifestyle risk factors, and family history of disease. Counseling  Your health care provider may ask you questions about your: Alcohol use. Tobacco use. Drug use. Emotional well-being. Home and relationship well-being. Sexual activity. Eating habits. History of falls. Memory and ability to understand (cognition). Work and work Astronomer. Reproductive health. Screening  You may have the following tests or measurements: Height, weight, and BMI. Blood pressure. Lipid and cholesterol levels. These may be checked every 5 years, or more frequently if you are over 77 years old. Skin check. Lung cancer screening. You may have this screening every year starting at age 77 if you have a 30-pack-year history of smoking and currently smoke or have quit within the past 15 years. Fecal occult blood test (FOBT) of the stool. You may have this test every year starting at age 77. Flexible sigmoidoscopy or colonoscopy. You may have a sigmoidoscopy every 5 years or a colonoscopy every 10 years starting at age 77. Hepatitis C blood test. Hepatitis B blood test. Sexually transmitted disease (STD) testing. Diabetes screening. This is done by checking your blood sugar (glucose) after you have not eaten for a while (fasting).  You may have this done every 1-3 years. Bone density scan. This is done to screen for osteoporosis. You may have this done starting at age 77. Mammogram. This may be done every 1-2 years. Talk to your health care provider about how often you should have regular mammograms. Talk with your health care provider about your test results, treatment options, and if  necessary, the need for more tests. Vaccines  Your health care provider may recommend certain vaccines, such as: Influenza vaccine. This is recommended every year. Tetanus, diphtheria, and acellular pertussis (Tdap, Td) vaccine. You may need a Td booster every 10 years. Zoster vaccine. You may need this after age 77. Pneumococcal 13-valent conjugate (PCV13) vaccine. One dose is recommended after age 77. Pneumococcal polysaccharide (PPSV23) vaccine. One dose is recommended after age 77. Talk to your health care provider about which screenings and vaccines you need and how often you need them. This information is not intended to replace advice given to you by your health care provider. Make sure you discuss any questions you have with your health care provider. Document Released: 06/26/2015 Document Revised: 02/17/2016 Document Reviewed: 03/31/2015 Elsevier Interactive Patient Education  2017 [place] Prevention in the Home Falls can cause injuries. They can happen to people of all ages. There are many things you can do to make your home safe and to help prevent falls. What can I do on the outside of my home? Regularly fix the edges of walkways and driveways and fix any cracks. Remove anything that might make you trip as you walk through a door, such as a raised step or threshold. Trim any bushes or trees on the path to your home. Use bright outdoor lighting. Clear any walking paths of anything that might make someone trip, such as rocks or tools. Regularly check to see if handrails are loose or broken. Make sure that both sides of any steps have handrails. Any raised decks and porches should have guardrails on the edges. Have any leaves, snow, or ice cleared regularly. Use sand or salt on walking paths during winter. Clean up any spills in your garage right away. This includes oil or grease spills. What can I do in the bathroom? Use night lights. Install grab bars by the toilet  and in the tub and shower. Do not use towel bars as grab bars. Use non-skid mats or decals in the tub or shower. If you need to sit down in the shower, use a plastic, non-slip stool. Keep the floor dry. Clean up any water that spills on the floor as soon as it happens. Remove soap buildup in the tub or shower regularly. Attach bath mats securely with double-sided non-slip rug tape. Do not have throw rugs and other things on the floor that can make you trip. What can I do in the bedroom? Use night lights. Make sure that you have a light by your bed that is easy to reach. Do not use any sheets or blankets that are too big for your bed. They should not hang down onto the floor. Have a firm chair that has side arms. You can use this for support while you get dressed. Do not have throw rugs and other things on the floor that can make you trip. What can I do in the kitchen? Clean up any spills right away. Avoid walking on wet floors. Keep items that you use a lot in easy-to-reach places. If you need to reach something above you, use a  strong step stool that has a grab bar. Keep electrical cords out of the way. Do not use floor polish or wax that makes floors slippery. If you must use wax, use non-skid floor wax. Do not have throw rugs and other things on the floor that can make you trip. What can I do with my stairs? Do not leave any items on the stairs. Make sure that there are handrails on both sides of the stairs and use them. Fix handrails that are broken or loose. Make sure that handrails are as long as the stairways. Check any carpeting to make sure that it is firmly attached to the stairs. Fix any carpet that is loose or worn. Avoid having throw rugs at the top or bottom of the stairs. If you do have throw rugs, attach them to the floor with carpet tape. Make sure that you have a light switch at the top of the stairs and the bottom of the stairs. If you do not have them, ask someone to add  them for you. What else can I do to help prevent falls? Wear shoes that: Do not have high heels. Have rubber bottoms. Are comfortable and fit you well. Are closed at the toe. Do not wear sandals. If you use a stepladder: Make sure that it is fully opened. Do not climb a closed stepladder. Make sure that both sides of the stepladder are locked into place. Ask someone to hold it for you, if possible. Clearly mark and make sure that you can see: Any grab bars or handrails. First and last steps. Where the edge of each step is. Use tools that help you move around (mobility aids) if they are needed. These include: Canes. Walkers. Scooters. Crutches. Turn on the lights when you go into a dark area. Replace any light bulbs as soon as they burn out. Set up your furniture so you have a clear path. Avoid moving your furniture around. If any of your floors are uneven, fix them. If there are any pets around you, be aware of where they are. Review your medicines with your doctor. Some medicines can make you feel dizzy. This can increase your chance of falling. Ask your doctor what other things that you can do to help prevent falls. This information is not intended to replace advice given to you by your health care provider. Make sure you discuss any questions you have with your health care provider. Document Released: 03/26/2009 Document Revised: 11/05/2015 Document Reviewed: 07/04/2014 Elsevier Interactive Patient Education  2017 [employer].

## 2022-09-27 ENCOUNTER — Ambulatory Visit: Payer: Medicare Other | Attending: Internal Medicine | Admitting: Internal Medicine

## 2022-09-28 ENCOUNTER — Ambulatory Visit (INDEPENDENT_AMBULATORY_CARE_PROVIDER_SITE_OTHER): Payer: Medicare Other | Admitting: Internal Medicine

## 2022-09-28 VITALS — BP 124/76 | HR 70 | Temp 97.6°F | Ht 63.0 in | Wt 169.1 lb

## 2022-09-28 DIAGNOSIS — R739 Hyperglycemia, unspecified: Secondary | ICD-10-CM | POA: Diagnosis not present

## 2022-09-28 DIAGNOSIS — I10 Essential (primary) hypertension: Secondary | ICD-10-CM | POA: Diagnosis not present

## 2022-09-28 DIAGNOSIS — F411 Generalized anxiety disorder: Secondary | ICD-10-CM

## 2022-09-28 DIAGNOSIS — K529 Noninfective gastroenteritis and colitis, unspecified: Secondary | ICD-10-CM | POA: Diagnosis not present

## 2022-09-28 DIAGNOSIS — E559 Vitamin D deficiency, unspecified: Secondary | ICD-10-CM

## 2022-09-28 DIAGNOSIS — I509 Heart failure, unspecified: Secondary | ICD-10-CM

## 2022-09-28 DIAGNOSIS — E538 Deficiency of other specified B group vitamins: Secondary | ICD-10-CM | POA: Diagnosis not present

## 2022-09-28 DIAGNOSIS — R531 Weakness: Secondary | ICD-10-CM | POA: Diagnosis not present

## 2022-09-28 NOTE — Progress Notes (Signed)
Patient ID: Renee [name], female   DOB: Sep 23, 1945, 77 y.o.   MRN: [id number]         Chief Complaint::  Medical Management of Chronic Issues (3 month follow up, pt having issue with recovering from previous sickness )  , fatigue, low stamina, chf, anxiety, low vit d       HPI:  Renee [name] is a 77 y.o. female here after recent acute gastroenteritis illness mar 2024 recent now improved symptoms except for persistent fatigue and just has not been able to get her stamina back.  Pt denies chest pain, increased sob or doe, wheezing, orthopnea, PND, increased LE swelling, palpitations, dizziness or syncope.   Pt denies polydipsia, polyuria, or new focal neuro s/s.    Pt denies fever, wt loss, night sweats, loss of appetite, or other constitutional symptoms  Denies worsening reflux, abd pain, dysphagia, n/v, bowel change or blood.  Denies worsening depressive symptoms, suicidal ideation, or panic; has ongoing anxiety    Wt Readings from Last 3 Encounters:  09/28/22 169 lb 2 oz (76.7 kg)  09/22/22 171 lb 12.8 oz (77.9 kg)  09/03/22 165 lb 5.5 oz (75 kg)   BP Readings from Last 3 Encounters:  09/28/22 124/76  09/22/22 116/60  09/03/22 139/85   Immunization History  Administered Date(s) Administered   Fluad Quad(high Dose 65+) 03/05/2019, 04/30/2020, 03/17/2021, 03/23/2022   Influenza Split 03/14/2011, 04/02/2013   Influenza Whole 03/11/2009, 03/29/2010   Influenza, High Dose Seasonal PF 04/30/2015, 03/21/2017, 03/24/2018   Influenza,inj,Quad PF,6+ Mos 06/11/2014, 03/04/2016   Influenza-Unspecified 06/14/2015, 03/13/2018   PFIZER(Purple Top)SARS-COV-2 Vaccination 07/20/2019, 08/10/2019   Pneumococcal Conjugate-13 12/18/2014   Pneumococcal Polysaccharide-23 09/07/2015   Tdap 09/28/2013   Health Maintenance Due  Topic Date Due   OPHTHALMOLOGY EXAM  05/03/2022   HEMOGLOBIN A1C  09/22/2022      Past Medical History:  Diagnosis Date   Allergy    tape, electrodes   Anxiety    AV  nodal re-entry tachycardia    a. s/p RFA 1993- Dr. Delena Serve, [facility]   Broken neck    1993   Cervical cancer    hx of cervical   ca   CHF (congestive heart failure)    Chronic chest pain    a. 2005 Cath: Mild nonobstructive plaque (30-40% LCX);  b. 08/2011 low risk myoview;  c. 09/2011 Coronary CT: NL cors w ca score of 0.   Chronic pain syndrome    Colon polyps    Complication of anesthesia    blood pressure and heart dropped in 2009     Concussion    1979, 1993   Degenerative joint disease    a. s/p L Total Knee Arthroplasty.   Diet-controlled type 2 diabetes mellitus    pt. denies   Diverticulosis of colon    Duodenitis without hemorrhage    Dysrhythmia    REPORTS , SINCE PLACEMENT OF PACEMAKER, STILL CAN FEEL HEART " GOING IN AND OUT OF R[facility]TH[facility]" REPORTS, YESTERDAY HER BP SYSTOLIC WAS IN THE 200s AND WAS BREATHLESS, HR 112, SWEATING ; DENIES SYNCOPE  , DENIES CHEST PAIN    Fibromyalgia    GERD (gastroesophageal reflux disease)    Hemorrhoids    Hypertension    Irritable bowel syndrome    Long QT interval    a. mild - advised to avoid meds that may prolong QT.   Myocardial infarction 09/1991   does not know when second heart attack was   Neuropathy  Osteoporosis    Paroxysmal A-fib    PER PATIENT. I CAN FEEL MY HEART RACING WHEN IM IN IT    Recurrent upper respiratory infection (URI)    SVT (supraventricular tachycardia)    a. nonsustained SVT - previously offered flecainide but refused;  b. 12/2012 30 day event monitor w/o significant arrhythmias.   Thyroid nodule    Past Surgical History:  Procedure Laterality Date   ABLATION OF DYSRHYTHMIC FOCUS  1993   ANTERIOR AND POSTERIOR REPAIR N/A 08/01/2017   Procedure: ANTERIOR (CYSTOCELE) AND POSTERIOR REPAIR (RECTOCELE);  Surgeon: Alfredo Martinez, MD;  Location: [facility];  Service: Urology;  Laterality: N/A;   APPENDECTOMY     1980   BILATERAL SALPINGECTOMY Left 08/01/2017   Procedure: SALPINGECTOMY;  Surgeon: Carrington Clamp, MD;  Location: [facility];  Service: Gynecology;  Laterality: Left;   BIOPSY THYROID     CARDIAC ELECTROPHYSIOLOGY STUDY AND ABLATION     atrioventricular nodal reentant tachycardia   CHOLECYSTECTOMY  1980   COLONOSCOPY  06/25/2008   Diverticulosis and Hemorrhoids   COLPOSCOPY     CORONARY ANGIOPLASTY  2007   CYSTOSCOPY N/A 08/01/2017   Procedure: CYSTOSCOPY;  Surgeon: Alfredo Martinez, MD;  Location: [facility];  Service: Urology;  Laterality: N/A;   EP IMPLANTABLE DEVICE N/A 03/03/2016   Procedure: Pacemaker Implant;  Surgeon: Marinus Maw, MD;  Location: [facility] INVASIVE CV LAB;  Service: Cardiovascular;  Laterality: N/A;   ESOPHAGOGASTRODUODENOSCOPY     HYSTEROSCOPY  2002   with resection of endometrial polyps. by Dr.MCPhail   HYSTEROSCOPY WITH D & C N/A 02/05/2015   Procedure: DILATATION AND CURETTAGE /HYSTEROSCOPY;  Surgeon: Waynard Reeds, MD;  Location: [facility];  Service: Gynecology;  Laterality: N/A;   LOOP RECORDER IMPLANT  08/21/2013   MDT LinQ implanted by Dr Ladona Ridgel for syncope   LOOP RECORDER IMPLANT N/A 08/21/2013   Procedure: LOOP RECORDER IMPLANT;  Surgeon: Marinus Maw, MD;  Location[facility];  Service: Cardiovascular;  Laterality: N/A;   ORIF RADIAL HEAD / NECK FRACTURE  09/05/88   TILT TABLE STUDY N/A 05/15/2013   Procedure: TILT TABLE STUDY;  Surgeon: [facility] Salvia, MD;  Location: [facility] C[facility] LAB;  Service: Cardiovascular;  Laterality: N/A;   TOTAL HIP ARTHROPLASTY  10/10   Left, at [facility]   UPPER GASTROINTESTINAL ENDOSCOPY     VAGINAL HYSTERECTOMY N/A 08/01/2017   Procedure: HYSTERECTOMY VAGINAL;  Surgeon: Carrington Clamp, MD;  Location: [facility];  Service: Gynecology;  Laterality: N/A;    reports that she has never smoked. She has never used smokeless tobacco. She reports that she does not currently use alcohol. She reports that she does not use drugs. family history includes Asthma in her mother and sister; Diabetes in her brother, mother, and sister; Heart  disease in her sister and sister; Lung cancer in her father; Ovarian cancer in her mother; Stroke in her mother. Allergies  Allergen Reactions   Cyclobenzaprine Other (See Comments)    Pt states that this medication does not work for her.     Azithromycin Other (See Comments)    Cannot take due to prolonged QT   Nitrofurantoin Other (See Comments)   Adhesive [Tape] Itching, Rash and Other (See Comments)    Reaction:  Blisters  Pt states that she is allergic to the adhesive backing of EKG pads if left on for more than 24 hours Patient states she tolerates paper tape ok   Moxifloxacin Other (See Comments)    Unknown  allergic reaction many years ago   Quinolones Other (See Comments)    Unknown allergic reaction many years ago   Current Outpatient Medications on File Prior to Visit  Medication Sig Dispense Refill   ACCU-CHEK FASTCLIX LANCETS MISC USE UP TO 4 TIMES DAILY AS  DIRECTED 408 each 1   ACCU-CHEK GUIDE test strip USE UP TO 4 TIMES DAILY AS  DIRECTED 100 each 1   ALPRAZolam (XANAX) 0.5 MG tablet Take 0.5-1 tablets (0.25-0.5 mg total) by mouth daily as needed for anxiety. 90 tablet 1   amoxicillin-clavulanate (AUGMENTIN) 875-125 MG tablet Take 1 tablet by mouth every 12 (twelve) hours. 14 tablet 0   aspirin EC 81 MG tablet Take 81 mg by mouth daily.     blood glucose meter kit and supplies KIT Dispense per patient and insurance preferred. Use up to four times daily as directed. E11.9 1 each 0   cetirizine (ZYRTEC) 10 MG tablet Take 10 mg by mouth daily as needed.     diltiazem (CARDIZEM CD) 240 MG 24 hr capsule TAKE 1 CAPSULE BY MOUTH IN THE  MORNING 100 capsule 2   diltiazem (CARDIZEM) 30 MG tablet Take 2 tablets (60 mg total) by mouth 4 (four) times daily as needed (as needed for breakthrough heart racing/palpitations). 90 tablet 0   flecainide (TAMBOCOR) 50 MG tablet TAKE 1 TABLET BY MOUTH  TWICE DAILY 200 tablet 2   furosemide (LASIX) 20 MG tablet Take 1 tablet (20 mg total) by  mouth daily as needed for fluid. 90 tablet 0   isosorbide mononitrate (IMDUR) 30 MG 24 hr tablet TAKE 1 TABLET BY MOUTH DAILY 100 tablet 2   losartan (COZAAR) 50 MG tablet Take 1 tablet (50 mg total) by mouth 2 (two) times daily. 180 tablet 3   nitroGLYCERIN (NITROSTAT) 0.4 MG SL tablet DISSOLVE 1 TABLET UNDER THE  TONGUE EVERY 5 MINUTES AS NEEDED FOR CHEST PAIN. MAX OF 3 TABLETS IN 15 MINUTES. CALL 911 IF PAIN  PERSISTS. 125 tablet 2   omeprazole (PRILOSEC) 40 MG capsule TAKE 1 CAPSULE BY MOUTH IN  THE MORNING AND AT BEDTIME 180 capsule 3   ondansetron (ZOFRAN-ODT) 4 MG disintegrating tablet Take 1 tablet (4 mg total) by mouth every 8 (eight) hours as needed for nausea or vomiting. 30 tablet 0   Probiotic Product (ALIGN) 4 MG CAPS Take 1 capsule (4 mg total) by mouth daily. 30 capsule 0   promethazine (PHENERGAN) 12.5 MG tablet Take 1 tablet (12.6 mg total) by mouth every 6 (six) hours as needed for nausea or vomiting. 10 tablet 0   trimethoprim (TRIMPEX) 100 MG tablet Take 100 mg by mouth daily as needed (For UTI per patient).     zolpidem (AMBIEN) 5 MG tablet TAKE 1 TABLET BY MOUTH AT  BEDTIME AS NEEDED FOR SLEEP 90 tablet 1   No current facility-administered medications on file prior to visit.        ROS:  All others reviewed and negative.  Objective        PE:  BP 124/76   Pulse 70   Temp 97.6 F (36.4 C) (Temporal)   Ht 5\' 3"  (1.6 m)   Wt 169 lb 2 oz (76.7 kg)   SpO2 96%   BMI 29.96 kg/m                 Constitutional: Pt appears in NAD, fatigued appearing  HENT: Head: NCAT.                Right Ear: External ear normal.                 Left Ear: External ear normal.                Eyes: . Pupils are equal, round, and reactive to light. Conjunctivae and EOM are normal               Nose: without d/c or deformity               Neck: Neck supple. Gross normal ROM               Cardiovascular: Normal rate and regular rhythm.                 Pulmonary/Chest: Effort  normal and breath sounds without rales or wheezing.                Abd:  Soft, NT, ND, + BS, no organomegaly               Neurological: Pt is alert. At baseline orientation, motor grossly intact               Skin: Skin is warm. No rashes, no other new lesions, LE edema - none               Psychiatric: Pt behavior is normal without agitation , mod nervous  Micro: none  Cardiac tracings I have personally interpreted today:  none  Pertinent Radiological findings (summarize): none   Lab Results  Component Value Date   WBC 13.1 (H) 09/03/2022   HGB 13.2 09/03/2022   HCT 41.8 09/03/2022   PLT 278 09/03/2022   GLUCOSE 121 (H) 09/03/2022   CHOL 162 03/23/2022   TRIG 67.0 03/23/2022   HDL 77.10 03/23/2022   LDLCALC 71 03/23/2022   ALT 18 09/03/2022   AST 18 09/03/2022   NA 138 09/03/2022   K 3.7 09/03/2022   CL 106 09/03/2022   CREATININE 0.72 09/03/2022   BUN 7 (L) 09/03/2022   CO2 22 09/03/2022   TSH 1.87 03/23/2022   INR 0.9 03/31/2020   HGBA1C 6.1 03/23/2022   MICROALBUR <0.7 03/23/2022   Assessment/Plan:  Renee [name] is a 77 y.o. White or Caucasian [1] female with  has a past medical history of Allergy, Anxiety, AV nodal re-entry tachycardia, Broken neck, Cervical cancer, CHF (congestive heart failure), Chronic chest pain, Chronic pain syndrome, Colon polyps, Complication of anesthesia, Concussion, Degenerative joint disease, Diet-controlled type 2 diabetes mellitus, Diverticulosis of colon, Duodenitis without hemorrhage, Dysrhythmia, Fibromyalgia, GERD (gastroesophageal reflux disease), Hemorrhoids, Hypertension, Irritable bowel syndrome, Long QT interval, Myocardial infarction (09/1991), Neuropathy, Osteoporosis, Paroxysmal A-fib, Recurrent upper respiratory infection (URI), SVT (supraventricular tachycardia), and Thyroid nodule.  Acute gastroenteritis Recent moderate illness, seen at ED, now resolved  Generalized weakness Appears to be post infectious related, most  recent labs reviewed with pt, declines further lab today, does not appear to need PT,  to f/u any worsening symptoms or concerns  Essential hypertension BP Readings from Last 3 Encounters:  09/28/22 124/76  09/22/22 116/60  09/03/22 139/85   Stable, pt to continue medical treatment dilt ER 240 qd, losartan 50 mg qd   Chronic congestive heart failure (HCC) Volume stable, cont lasix 20 qd prn  Anxiety state Chronic mild persistent, cont xanax prn  Vitamin D deficiency Last vitamin D  Lab Results  Component Value Date   VD25OH 31.10 03/23/2022   Low, reminded to start oral replacement  Followup: Return in about 6 months (around 03/30/2023).  Oliver Barre, MD 10/01/2022 5:44 AM [facility] Medicine

## 2022-09-28 NOTE — Patient Instructions (Addendum)
Please continue all other medications as before, and refills have been done if requested.  Please have the pharmacy call with any other refills you may need.  Please continue your efforts at being more active, low cholesterol diet, and weight control.  Please keep your appointments with your specialists as you may have planned  Please make an Appointment to return in 6 months, or sooner if needed, also with Lab Appointment for testing done 3-5 days before at the FIRST FLOOR Lab (so this is for TWO appointments - please see the scheduling desk as you leave)  

## 2022-10-01 ENCOUNTER — Encounter: Payer: Self-pay | Admitting: Internal Medicine

## 2022-10-01 DIAGNOSIS — K529 Noninfective gastroenteritis and colitis, unspecified: Secondary | ICD-10-CM | POA: Insufficient documentation

## 2022-10-01 NOTE — Assessment & Plan Note (Signed)
Recent moderate illness, seen at ED, now resolved

## 2022-10-01 NOTE — Assessment & Plan Note (Signed)
Last vitamin D Lab Results  Component Value Date   VD25OH 31.10 03/23/2022   Low, reminded to start oral replacement

## 2022-10-01 NOTE — Assessment & Plan Note (Signed)
Volume stable, cont lasix 20 qd prn

## 2022-10-01 NOTE — Assessment & Plan Note (Signed)
BP Readings from Last 3 Encounters:  09/28/22 124/76  09/22/22 116/60  09/03/22 139/85   Stable, pt to continue medical treatment dilt ER 240 qd, losartan 50 mg qd

## 2022-10-01 NOTE — Assessment & Plan Note (Signed)
Appears to be post infectious related, most recent labs reviewed with pt, declines further lab today, does not appear to need PT,  to f/u any worsening symptoms or concerns

## 2022-10-01 NOTE — Assessment & Plan Note (Signed)
Chronic mild persistent, cont xanax prn

## 2022-10-02 ENCOUNTER — Encounter (HOSPITAL_COMMUNITY): Payer: Self-pay | Admitting: Cardiology

## 2022-10-02 ENCOUNTER — Telehealth: Payer: Self-pay | Admitting: Student

## 2022-10-02 NOTE — Telephone Encounter (Signed)
   The patient called the after-hours line reporting elevated BP of 166/139 when checked earlier today. Reports she was having nausea at that time but has been experiencing intermittent nausea and vomiting since last month which prompted ED evaluation. She rechecked her blood pressure at the time of this phone call and BP had improved to 146/83. She had not yet taken her medications today and plans to do so at this time.  She did express multiple concerns about having no quality of life for the past 2 years and that she has a "bad heart" and is concerned this is causing her issues. We reviewed her echocardiogram from 01/2022 which showed a preserved EF and normal RV function. Also had a Coronary CT with calcium score of 0. Does have a PPM in place which is followed by EP.   She is going to keep a log of blood pressure readings and report back if this remains elevated. Will forward today's note to Dr. Shirlee Latch as an Lorain Childes.  Signed, Ellsworth Lennox, PA-C 10/02/2022, 3:17 PM

## 2022-10-03 NOTE — Telephone Encounter (Signed)
Dr Shirlee Latch Your schedules are full until the summer schedule is released (Jun-Aug), would you like Ms Chenard to follow up in APP clinic or at [facility]?

## 2022-10-10 ENCOUNTER — Encounter: Payer: Self-pay | Admitting: Gastroenterology

## 2022-10-12 ENCOUNTER — Ambulatory Visit: Payer: Medicare Other

## 2022-10-12 ENCOUNTER — Other Ambulatory Visit: Payer: Medicare Other

## 2022-10-12 ENCOUNTER — Ambulatory Visit
Admission: RE | Admit: 2022-10-12 | Discharge: 2022-10-12 | Disposition: A | Discharge status: Home / Self Care | Payer: Medicare Other | Source: Ambulatory Visit | Attending: Internal Medicine | Admitting: Internal Medicine

## 2022-10-12 DIAGNOSIS — N644 Mastodynia: Secondary | ICD-10-CM | POA: Diagnosis not present

## 2022-10-27 ENCOUNTER — Other Ambulatory Visit: Payer: Self-pay | Admitting: Physician Assistant

## 2022-10-31 ENCOUNTER — Inpatient Hospital Stay (HOSPITAL_COMMUNITY): Admission: RE | Admit: 2022-10-31 | Payer: Medicare Other | Source: Ambulatory Visit | Admitting: Cardiology

## 2022-11-05 ENCOUNTER — Other Ambulatory Visit (HOSPITAL_COMMUNITY): Payer: Self-pay | Admitting: Cardiology

## 2022-11-22 ENCOUNTER — Telehealth (HOSPITAL_COMMUNITY): Payer: Self-pay | Admitting: *Deleted

## 2022-11-22 ENCOUNTER — Telehealth (HOSPITAL_COMMUNITY): Payer: Self-pay | Admitting: Cardiology

## 2022-11-22 ENCOUNTER — Encounter (HOSPITAL_COMMUNITY): Payer: Self-pay | Admitting: Cardiology

## 2022-11-22 NOTE — Telephone Encounter (Signed)
Attempted to contact pt with provider recommendations No answer unable to leave msg-VM full My chart message sent

## 2022-11-22 NOTE — Telephone Encounter (Signed)
[name] morning, I am sorry to hear you are having a hard time right now. I will certainly forward your message to a provider to review however with most of your concerns focused on decreased quality of life I would strongly recommend reaching out to your primary care provider.       -Mara Favero   This MyChart message has not been read.   [name]  P [facility] (supporting You)20 hours ago (1:31 PM)    I am not ok. My blood pressure @ 1:18pm was160/90, hr 101. I keep telling Dr Jearld Pies  I have no quality of life, My eyes are open but my body doesn't function,  it's as though my body  has died on me and my brain is still working most of the time. End of May ,first of June I think I had a mild stroke., again. It's as though I leave my body and I laid down and slept and couldn't believe I woke up. I need to know if I can get some help/ answers as to why  I have no life in my body? I struggle and try to keep going but it's just not working,  Please help me figure this out and if there's anything that be done, please!     Renee [name]

## 2022-11-22 NOTE — Telephone Encounter (Signed)
Telephone encounter opened to forward to provider-original message is a appointment request message unable to forward.  Please provide additional recommendations

## 2022-11-22 NOTE — Telephone Encounter (Signed)
Pt has been communicating through mychart for concerns, see below. I called patient to f/u. She states she has been feeling bad for a while and it continues to get worse. Spent ~25 min on phone with pt. She reports decreased quality of life, "feel like my body is dead but my brain is working", decreased appetite, dizziness and shortness of breath at times, as well as elevated BP. Advised pt she had an appt with Dr Shirlee Latch on 5/20 to discuss these issues and no-showed for it and that we had resch her for 8/2. She states she did not know of this appt. She reported a lot of frustration reg her ER visit on 3/23. She states she knows something is wrong with her body and feels it is r/t her heart and only wants to discuss with Dr Shirlee Latch. Advised he is out of country [place] 2 weeks, will review info with him upon his return and let him know if anything is needed before her 8/2 appt, she feels she needs a heart monitor.   [name]  P [facility] (supporting Chantel Constance Haw, CMA)6 hours ago (10:24 AM)    I want to.speak with Dr Maurice March feel something is being Missed! It can be virtual but I want to speak with my doctor!  I'm reaching out to Dr Jearld Pies asap, not you for what I should do. YOU DON'T RESIDE IN MY BODY. Sincerely [name]     Chantel Constance Haw, CMA  [name]6 hours ago (9:53 AM)    Ms Renee [name] morning, I am sorry to hear you are having a hard time right now. I will certainly forward your message to a provider to review however with most of your concerns focused on decreased quality of life I would strongly recommend reaching out to your primary care provider.       -Chantel     [name]  P [facility] (supporting Chantel Constance Haw, CMA)Yesterday (1:31 PM)    I am not ok. My blood pressure @ 1:18pm was160/90, hr 101. I keep telling Dr Jearld Pies  I have no quality of life, My eyes are open but my body doesn't function,  it's as  though my body  has died on me and my brain is still working most of the time. End of May ,first of June I think I had a mild stroke., again. It's as though I leave my body and I laid down and slept and couldn't believe I woke up. I need to know if I can get some help/ answers as to why  I have no life in my body? I struggle and try to keep going but it's just not working,  Please help me figure this out and if there's anything that be done, please!     Renee [name]

## 2022-11-23 ENCOUNTER — Telehealth (HOSPITAL_COMMUNITY): Payer: Self-pay

## 2022-11-23 NOTE — Telephone Encounter (Signed)
Patient to called this morning stating she had concerns and may need to go to the ED, but was not sure if she wanted [place] as she stated if she drove herself there she would have to wait for hours in the waiting room. She felt that it was not appropriate to leave her waiting for hours to be seen. Patient was asked how we could assist her today and patient and her response was irate. I informed the patient that we had no control over the Emergency Department and even if she was there we could not  expedite her evaluation by the ED MD.  She asked could she go to [facility] ED I stated she could but if she got admitted to [facility] I told her that no one form the Heart Failure Team would be able to see her there as we do not round at [facility]. I stated that is why we tell all out patients to come to the [facility] ED department, due to the fact if they get admitted, then the Heart failure Team are able to round on the patient and be part of their hospital care. Patient was not happy with the response and asked if Herbert Seta could call her as she spoke with her yesterday. Inform Patient that Herbert Seta was in a meeting at the minute and that I would pass this information along to her.

## 2022-11-23 NOTE — Telephone Encounter (Signed)
Per Prince Rome, NP ok to place 14-day live Zio AT

## 2022-11-24 NOTE — Telephone Encounter (Addendum)
Called patient states she can't wear a Zio it doesn't stay on her. She wants a 30 day halter and again stated that it felt like she was having a TIA or something similar. She stated she and some sort of dizzy spell at work yesterday that made her work colleagues concerned about her as " she turned pale and became vacant"

## 2022-11-25 ENCOUNTER — Encounter (HOSPITAL_COMMUNITY): Payer: Self-pay

## 2022-11-25 ENCOUNTER — Emergency Department (HOSPITAL_COMMUNITY): Payer: Medicare Other

## 2022-11-25 ENCOUNTER — Other Ambulatory Visit: Payer: Self-pay

## 2022-11-25 ENCOUNTER — Emergency Department (HOSPITAL_COMMUNITY)
Admission: EM | Admit: 2022-11-25 | Discharge: 2022-11-25 | Disposition: A | Discharge status: Home / Self Care | Payer: Medicare Other | Attending: Emergency Medicine | Admitting: Emergency Medicine

## 2022-11-25 DIAGNOSIS — R42 Dizziness and giddiness: Secondary | ICD-10-CM | POA: Diagnosis not present

## 2022-11-25 DIAGNOSIS — Z7982 Long term (current) use of aspirin: Secondary | ICD-10-CM | POA: Diagnosis not present

## 2022-11-25 DIAGNOSIS — R531 Weakness: Secondary | ICD-10-CM | POA: Insufficient documentation

## 2022-11-25 LAB — CBC
HCT: 40.3 % (ref 36.0–46.0)
Hemoglobin: 12.8 g/dL (ref 12.0–15.0)
MCH: 26.3 pg (ref 26.0–34.0)
MCHC: 31.8 g/dL (ref 30.0–36.0)
MCV: 82.9 fL (ref 80.0–100.0)
Platelets: 258 10*3/uL (ref 150–400)
RBC: 4.86 MIL/uL (ref 3.87–5.11)
RDW: 13.9 % (ref 11.5–15.5)
WBC: 5.4 10*3/uL (ref 4.0–10.5)
nRBC: 0 % (ref 0.0–0.2)

## 2022-11-25 LAB — COMPREHENSIVE METABOLIC PANEL
ALT: 23 U/L (ref 0–44)
AST: 20 U/L (ref 15–41)
Albumin: 4 g/dL (ref 3.5–5.0)
Alkaline Phosphatase: 119 U/L (ref 38–126)
Anion gap: 9 (ref 5–15)
BUN: 9 mg/dL (ref 8–23)
CO2: 25 mmol/L (ref 22–32)
Calcium: 9.5 mg/dL (ref 8.9–10.3)
Chloride: 103 mmol/L (ref 98–111)
Creatinine, Ser: 0.71 mg/dL (ref 0.44–1.00)
GFR, Estimated: >60 mL/min (ref >60)
Glucose, Bld: 95 mg/dL (ref 70–99)
Potassium: 3.8 mmol/L (ref 3.5–5.1)
Sodium: 137 mmol/L (ref 135–145)
Total Bilirubin: 1 mg/dL (ref 0.3–1.2)
Total Protein: 6.6 g/dL (ref 6.5–8.1)

## 2022-11-25 NOTE — ED Provider Notes (Addendum)
[place] EMERGENCY DEPARTMENT AT [facility] Provider Note   CSN: [id number] Arrival date & time: 11/25/22  1035     History  Chief Complaint  Patient presents with   Dizziness   Concentration Problems    Renee [name] is a 77 y.o. female.  HPI 77 yo female co episodes of dizzy spells, last occurred Tuesday and Wendesday.  Seen for same 04/03/22 and sent from urgent care to hospital with concern for mi or stroke.  States she still works several days a week and had symptoms at work this week.  She is followed by Dr. Jonny Ruiz. She states she has chest pain sometimes and comes into the ED, no chest pain today. I just feel off in my head, I dont' feel right in my head. She reports that her doctor physiologist is Dr. Ladona Ridgel.  She reports prior history of complete heart block and pacemaker in place.  She has not noted any recent problems with her pacemaker. She denies headache, head injury, current chest pain, change in dyspnea, nausea, vomiting, she reports some mild weight gain.  Her appetite has been low normal.     Home Medications Prior to Admission medications   Medication Sig Start Date End Date Taking? Authorizing Provider  aspirin EC 81 MG tablet Take 81 mg by mouth daily.   Yes [provider]  cetirizine (ZYRTEC) 10 MG tablet Take 10 mg by mouth daily as needed.   Yes [provider]  diltiazem (CARDIZEM CD) 240 MG 24 hr capsule TAKE 1 CAPSULE BY MOUTH IN THE  MORNING 08/03/22  Yes Laurey Morale, MD  flecainide (TAMBOCOR) 50 MG tablet TAKE 1 TABLET BY MOUTH TWICE  DAILY 11/08/22  Yes Laurey Morale, MD  losartan (COZAAR) 50 MG tablet Take 1 tablet (50 mg total) by mouth 2 (two) times daily. 11/03/21  Yes Laurey Morale, MD  omeprazole (PRILOSEC) 40 MG capsule TAKE 1 CAPSULE BY MOUTH IN  THE MORNING AND AT BEDTIME 11/15/21  Yes Meryl Dare, MD  zolpidem (AMBIEN) 5 MG tablet TAKE 1 TABLET BY MOUTH AT  BEDTIME AS NEEDED FOR SLEEP 12/22/20  Yes  Corwin Levins, MD  ACCU-CHEK FASTCLIX LANCETS MISC USE UP TO 4 TIMES DAILY AS  DIRECTED 04/11/18   Corwin Levins, MD  ACCU-CHEK GUIDE test strip USE UP TO 4 TIMES DAILY AS  DIRECTED 04/11/18   Corwin Levins, MD  ALPRAZolam Prudy Feeler) 0.5 MG tablet Take 0.5-1 tablets (0.25-0.5 mg total) by mouth daily as needed for anxiety. 03/23/22   Corwin Levins, MD  amoxicillin-clavulanate (AUGMENTIN) 875-125 MG tablet Take 1 tablet by mouth every 12 (twelve) hours. 09/03/22   Jacalyn Lefevre, MD  blood glucose meter kit and supplies KIT Dispense per patient and insurance preferred. Use up to four times daily as directed. E11.9 02/21/18   Corwin Levins, MD  diltiazem (CARDIZEM) 30 MG tablet Take 2 tablets (60 mg total) by mouth 4 (four) times daily as needed (as needed for breakthrough heart racing/palpitations). 08/26/22   Sheilah Pigeon, PA-C  furosemide (LASIX) 20 MG tablet TAKE 1 TABLET BY MOUTH DAILY AS  NEEDED FOR FLUID 10/27/22   Sheilah Pigeon, PA-C  isosorbide mononitrate (IMDUR) 30 MG 24 hr tablet TAKE 1 TABLET BY MOUTH DAILY 05/17/22   Laurey Morale, MD  nitroGLYCERIN (NITROSTAT) 0.4 MG SL tablet DISSOLVE 1 TABLET UNDER THE  TONGUE EVERY 5 MINUTES AS NEEDED FOR CHEST PAIN. MAX OF 3  TABLETS IN 15 MINUTES. CALL 911 IF PAIN  PERSISTS. 08/01/22   Laurey Morale, MD  ondansetron (ZOFRAN-ODT) 4 MG disintegrating tablet Take 1 tablet (4 mg total) by mouth every 8 (eight) hours as needed for nausea or vomiting. 09/06/22   Corwin Levins, MD  Probiotic Product (ALIGN) 4 MG CAPS Take 1 capsule (4 mg total) by mouth daily. 09/03/22   Jacalyn Lefevre, MD  promethazine (PHENERGAN) 12.5 MG tablet Take 1 tablet (12.6 mg total) by mouth every 6 (six) hours as needed for nausea or vomiting. 09/03/22   Jacalyn Lefevre, MD  trimethoprim (TRIMPEX) 100 MG tablet Take 100 mg by mouth daily as needed (For UTI per patient). 06/24/20   [provider]      Allergies    Cyclobenzaprine, Azithromycin, Nitrofurantoin,  Adhesive [tape], Moxifloxacin, and Quinolones    Review of Systems   Review of Systems  Physical Exam Updated Vital Signs BP 120/81   Pulse 61   Temp 97.9 F (36.6 C) (Oral)   Resp 18   Ht 1.6 m (5\' 3" )   Wt 76.7 kg   SpO2 98%   BMI 29.95 kg/m  Physical Exam Vitals and nursing note reviewed.  Constitutional:      Appearance: She is well-developed.  HENT:     Head: Normocephalic and atraumatic.     Right Ear: External ear normal.     Left Ear: External ear normal.     Nose: Nose normal.  Eyes:     Extraocular Movements: Extraocular movements intact.     Conjunctiva/sclera: Conjunctivae normal.     Pupils: Pupils are equal, round, and reactive to light.  Cardiovascular:     Rate and Rhythm: Normal rate and regular rhythm.     Heart sounds: Normal heart sounds.  Pulmonary:     Effort: Pulmonary effort is normal.     Breath sounds: Normal breath sounds.  Abdominal:     General: Bowel sounds are normal.     Palpations: Abdomen is soft.  Musculoskeletal:        General: Normal range of motion.     Cervical back: Normal range of motion and neck supple.  Skin:    General: Skin is warm and dry.  Neurological:     Mental Status: She is alert and oriented to person, place, and time.     Deep Tendon Reflexes: Reflexes are normal and symmetric.  Psychiatric:        Behavior: Behavior normal.        Thought Content: Thought content normal.        Judgment: Judgment normal.     ED Results / Procedures / Treatments   Labs (all labs ordered are listed, but only abnormal results are displayed) Labs Reviewed  CBC  COMPREHENSIVE METABOLIC PANEL    EKG EKG Interpretation  Date/Time:  Friday November 25 2022 10:47:36 EDT Ventricular Rate:  64 PR Interval:  186 QRS Duration: 86 QT Interval:  442 QTC Calculation: 455 R Axis:   47 Text Interpretation: Normal sinus rhythm Cannot rule out Inferior infarct , age undetermined Abnormal ECG When compared with ECG of 03-Sep-2022  15:41, PREVIOUS ECG IS PRESENT Confirmed by Margarita Grizzle [phone]) on 11/25/2022 1:30:52 PM  Radiology CT Head Wo Contrast  Result Date: 11/25/2022 CLINICAL DATA:  Mental status change, unknown cause. Dizziness and difficulty concentrating. EXAM: CT HEAD WITHOUT CONTRAST TECHNIQUE: Contiguous axial images were obtained from the base of the skull through the vertex without intravenous contrast. RADIATION  DOSE REDUCTION: This exam was performed according to the departmental dose-optimization program which includes automated exposure control, adjustment of the mA and/or kV according to patient size and/or use of iterative reconstruction technique. COMPARISON:  Head CT 10/26/2020 FINDINGS: Brain: There is no evidence of an acute infarct, intracranial hemorrhage, mass, midline shift, or extra-axial fluid collection. Mild cerebral atrophy is within normal limits for age. Cerebral white matter hypodensities are unchanged and nonspecific but compatible with mild-to-moderate chronic small vessel ischemic disease. Vascular: No hyperdense vessel. Skull: No acute fracture or suspicious osseous lesion. Sinuses/Orbits: Visualized paranasal sinuses and mastoid air cells are clear. Unremarkable orbits. Other: None. IMPRESSION: 1. No evidence of acute intracranial abnormality. 2. Mild-to-moderate chronic small vessel ischemic disease. Electronically Signed   By: Sebastian Ache M.D.   On: 11/25/2022 13:02   DG Chest Port 1 View  Result Date: 11/25/2022 CLINICAL DATA:  Weakness EXAM: PORTABLE CHEST 1 VIEW COMPARISON:  X-Sandy Blouch 06/08/2022 and older FINDINGS: No consolidation, pneumothorax or effusion. No edema. Normal cardiopericardial silhouette. Left chest pacemaker with leads along the right side of the heart. Degenerative changes of the shoulders. Osteopenia. IMPRESSION: No acute cardiopulmonary disease.  Pacemaker. Electronically Signed   By: Karen Kays M.D.   On: 11/25/2022 12:43    Procedures Procedures    Medications  Ordered in ED Medications - No data to display  ED Course/ Medical Decision Making/ A&P Clinical Course as of 11/25/22 1547  Fri Nov 25, 2022  1544 CBC reviewed interpreted within normal limits Complete metabolic panel reviewed interpreted and within normal limits [DR]  1544 CT head reviewed interpreted and no evidence of acute abnormality noted [DR]  1544 Chest x-Delyla Sandeen reviewed interpreted and no evidence of acute abnormality pacemaker in place radiologist interpretation concurs [DR]    Clinical Course User Index [DR] Margarita Grizzle, MD                             Medical Decision Making Amount and/or Complexity of Data Reviewed Labs: ordered. Radiology: ordered.   77 year old female presents today complaining of intermittent lightheadedness, feeling outside of her body, inability to focus, with multiple spells over the past 9 months. Differential diagnosis includes but is not limited to cardiac abnormalities including bradycardia, hypo-, hypertension, anemia, metabolic abnormalities including hyponatremia and hyperglycemia and hypoglycemia. Patient does not have any focal neurological deficits and focus low on differential Patient evaluated here with head CT, EKG, labs which are within normal limits Patient is pending interrogation of her pacemaker If this is normal patient appears stable for discharge home. Verbal report that no acute abnormalities were noted on pacemaker interrogation Patient has remained hemodynamically stable here in the ED. I have discussed all findings with her We discussed return precautions and need for close follow-up and she voices understanding.       Final Clinical Impression(s) / ED Diagnoses Final diagnoses:  Weakness    Rx / DC Orders ED Discharge Orders     None         Margarita Grizzle, MD 11/25/22 1547    Margarita Grizzle, MD 11/25/22 1610

## 2022-11-25 NOTE — Discharge Instructions (Addendum)
You were evaluated here in the emergency department for weakness. You were evaluated with EKG, complete blood count, complete metabolic panel, and head CT.  These all appear to be normal or without any evidence of acute changes. Your pacemaker was interrogated and appears to have no problems working. Please follow-up with your doctor next week Return to the emergency department if you are having any worsening or new symptoms.

## 2022-11-25 NOTE — ED Triage Notes (Signed)
Pt arrived POV w/ c/o spells of dizziness, inability to focus, outside of body feeling, can't write normally. Report they occurred Tues and Wed while at work. Pt has had these spells going on for a while.

## 2022-11-25 NOTE — Telephone Encounter (Signed)
Called patient again this morning with recommendations and again she was not happy with advice and stated she want a cardiac CT. Advised her again that her symptoms were more neurological than cardiac and that Dr. Shirlee Latch is a cardiologist not a neurologist. Again advised her the sooner she got to the ED to get her symptoms properly evaluated the sooner they could give her the correct treatment needed.

## 2022-11-30 ENCOUNTER — Telehealth: Payer: Self-pay

## 2022-11-30 NOTE — Telephone Encounter (Signed)
Transition Care Management Follow-up Telephone Call Date of discharge and from where: [facility] 6/14 How have you been since you were released from the hospital? Not much better. Patient had complaints of her visit to [facility] of 3/23 and 6/14 claims she will never be back and will be filling a complaint  Any questions or concerns? Yes  Items Reviewed: Did the pt receive and understand the discharge instructions provided? Yes  Medications obtained and verified? No  Other? No  Any new allergies since your discharge? No  Dietary orders reviewed? No Do you have support at home? Yes     Follow up appointments reviewed:  PCP Hospital f/u appt confirmed? Yes  Scheduled to see  on  @ . Specialist Hospital f/u appt confirmed? Yes  Scheduled to see  on  @ . Are transportation arrangements needed? No  If their condition worsens, is the pt aware to call PCP or go to the Emergency Dept.? Yes Was the patient provided with contact information for the PCP's office or ED? Yes Was to pt encouraged to call back with questions or concerns? Yes

## 2022-12-05 DIAGNOSIS — H16101 Unspecified superficial keratitis, right eye: Secondary | ICD-10-CM | POA: Diagnosis not present

## 2022-12-09 ENCOUNTER — Ambulatory Visit (INDEPENDENT_AMBULATORY_CARE_PROVIDER_SITE_OTHER): Payer: Medicare Other | Admitting: Nurse Practitioner

## 2022-12-09 VITALS — BP 110/84 | HR 62 | Temp 98.3°F | Ht 63.0 in | Wt 167.0 lb

## 2022-12-09 DIAGNOSIS — N3001 Acute cystitis with hematuria: Secondary | ICD-10-CM

## 2022-12-09 LAB — POCT URINALYSIS DIPSTICK
Bilirubin, UA: NEGATIVE
Glucose, UA: NEGATIVE
Ketones, UA: NEGATIVE
Nitrite, UA: NEGATIVE
Protein, UA: POSITIVE — AB
Spec Grav, UA: 1.025 (ref 1.010–1.025)
Urobilinogen, UA: NEGATIVE E.U./dL — AB
pH, UA: 5.5 (ref 5.0–8.0)

## 2022-12-09 MED ORDER — CEPHALEXIN 500 MG PO CAPS
500.0000 mg | ORAL_CAPSULE | Freq: Two times a day (BID) | ORAL | 0 refills | Status: DC
Start: 2022-12-09 — End: 2023-03-08

## 2022-12-09 NOTE — Progress Notes (Signed)
Established Patient Office Visit  Subjective   Patient ID: Renee [name], female    DOB: [birth year]  Age: 77 y.o. MRN: [id number]  Chief Complaint  Patient presents with   Urinary Tract Infection    About three to two days, no burning sensation. More of a pressure, scanty urination   Estimated Creatinine Clearance: 57.5 mL/min (by C-G formula based on SCr of 0.71 mg/dL).  Patient has for acute visit for the above.  Is experiencing some dysuria and urinary urgency with lower abdominal pressure.  Reports that this feels similar to UTIs that she has had in the past.  Used to have frequent UTIs and was being followed by urology but has not followed up with them in over a year.  Used to take trimethoprim for prevention, has not been taking this routinely for the last couple of years.  Reports that cephalexin usually will resolve UTIs for her.    Review of Systems  Constitutional:  Negative for chills and fever.  Genitourinary:  Positive for frequency. Negative for dysuria and hematuria.      Objective:     BP 110/84   Pulse 62   Temp 98.3 F (36.8 C) (Temporal)   Ht 5\' 3"  (1.6 m)   Wt 167 lb (75.8 kg)   SpO2 94%   BMI 29.58 kg/m    Physical Exam Vitals reviewed.  Constitutional:      General: She is not in acute distress.    Appearance: Normal appearance.  HENT:     Head: Normocephalic and atraumatic.  Neck:     Vascular: No carotid bruit.  Cardiovascular:     Rate and Rhythm: Normal rate and regular rhythm.     Pulses: Normal pulses.     Heart sounds: Normal heart sounds.  Pulmonary:     Effort: Pulmonary effort is normal.     Breath sounds: Normal breath sounds.  Abdominal:     Tenderness: There is no right CVA tenderness or left CVA tenderness.  Skin:    General: Skin is warm and dry.  Neurological:     General: No focal deficit present.     Mental Status: She is alert and oriented to person, place, and time.  Psychiatric:        Mood and Affect: Mood  normal.        Behavior: Behavior normal.        Judgment: Judgment normal.    Point-of-care urinalysis positive for blood and leukocytes.  Results for orders placed or performed in visit on 12/09/22  POCT urinalysis dipstick  Result Value Ref Range   Color, UA yellow    Clarity, UA cludy    Glucose, UA Negative Negative   Bilirubin, UA negative    Ketones, UA negative    Spec Grav, UA 1.025 1.010 - 1.025   Blood, UA 2+    pH, UA 5.5 5.0 - 8.0   Protein, UA Positive (A) Negative   Urobilinogen, UA negative (A) 0.2 or 1.0 E.U./dL   Nitrite, UA negative    Leukocytes, UA Small (1+) (A) Negative   Appearance     Odor        The 10-year ASCVD risk score (Arnett DK, et al., 2019) is: 35.4%    Assessment & Plan:   Problem List Items Addressed This Visit       Genitourinary   Acute cystitis with hematuria - Primary    Acute Creatinine clearance approximate 57.5 mL/min Treat with cephalexin  500 mg twice a day x 7 days Ordered urine culture, further recommendations may be made based upon the results. Patient encouraged to call office next week if symptoms persist or worsen.      Relevant Medications   cephALEXin (KEFLEX) 500 MG capsule   Other Relevant Orders   Urine Culture   POCT urinalysis dipstick (Completed)    Return if symptoms worsen or fail to improve.    Elenore Paddy, NP

## 2022-12-09 NOTE — Assessment & Plan Note (Signed)
Acute Creatinine clearance approximate 57.5 mL/min Treat with cephalexin 500 mg twice a day x 7 days Ordered urine culture, further recommendations may be made based upon the results. Patient encouraged to call office next week if symptoms persist or worsen.

## 2022-12-10 LAB — URINE CULTURE

## 2022-12-26 ENCOUNTER — Telehealth: Payer: Self-pay | Admitting: *Deleted

## 2022-12-26 ENCOUNTER — Encounter: Payer: Self-pay | Admitting: Internal Medicine

## 2022-12-26 DIAGNOSIS — B009 Herpesviral infection, unspecified: Secondary | ICD-10-CM | POA: Insufficient documentation

## 2022-12-26 DIAGNOSIS — H2511 Age-related nuclear cataract, right eye: Secondary | ICD-10-CM | POA: Diagnosis not present

## 2022-12-26 DIAGNOSIS — H2512 Age-related nuclear cataract, left eye: Secondary | ICD-10-CM | POA: Diagnosis not present

## 2022-12-26 NOTE — Telephone Encounter (Signed)
   Pre-operative Risk Assessment    Patient Name: Renee [name]  DOB: 1945-12-26 MRN: [id number]      Request for Surgical Clearance    Procedure:   CATARACT EXTRACTION BY PE, IOL-RIGHT EYE  Date of Surgery:  Clearance 01/18/23                                 Surgeon:  DR. Wylie Hail. DelMONTE Surgeon's Group or Practice Name:  [facility]  Phone number:  [phone] Fax number:  [phone]   Type of Clearance Requested:   - Medical ; NO MEDICATIONS LISTED AS NEEDING TO BE HELD NEED DEVICE CLEARANCE AS WELL; WILL SEND TO DEVICE CLINIC   Type of Anesthesia:   IV SEDATION   Additional requests/questions:    Elpidio Anis   12/26/2022, 5:45 PM

## 2022-12-26 NOTE — Progress Notes (Signed)
PERIOPERATIVE PRESCRIPTION FOR IMPLANTED CARDIAC DEVICE PROGRAMMING   Patient Information:  Patient: Renee [name]  MRN: [id number]  Date of Birth: 21-Jul-1945      Planned Procedure:  CATARACT EXTRACTION BY PE, IOL-RIGHT EYE   Surgeon:  DR. Wylie Hail DelMONTE  Date of Procedure:  01/18/2023  Surgeon's Group or Practice Name:  [facility]  Phone number:  [phone] Fax number:  [phone]   Device Information:   Clinic EP Physician:   Dr. Lewayne Bunting Device Type:  Pacemaker Manufacturer and Phone #:  Biotronik: [phone] Pacemaker Dependent?:  No Date of Last Device Check:  08/26/2022        Normal Device Function?:  Yes     Electrophysiologist's Recommendations:   Have magnet available. Provide continuous ECG monitoring when magnet is used or reprogramming is to be performed.  Procedure may interfere with device function.  Magnet should be placed over device during procedure.  Per Device Clinic Standing Orders, Lenor Coffin  12/26/2022 7:25 PM

## 2022-12-27 NOTE — Telephone Encounter (Signed)
   Patient Name: Renee [name]  DOB: February 25, 1946 MRN: [id number]  Primary Cardiologist: Dr. Ladona Ridgel  Chart reviewed as part of pre-operative protocol coverage. Cataract extractions are recognized in guidelines as low risk surgeries that do not typically require specific preoperative testing or holding of blood thinner therapy. Therefore, given past medical history and time since last visit, based on ACC/AHA guidelines, Renee [name] would be at acceptable risk for the planned procedure without further cardiovascular testing.   I will route this recommendation to the requesting party via Epic fax function and remove from pre-op pool.  Please call with questions.  Ronney Asters, NP 12/27/2022, 1:13 PM

## 2022-12-28 ENCOUNTER — Other Ambulatory Visit: Payer: Self-pay | Admitting: Gastroenterology

## 2022-12-28 ENCOUNTER — Other Ambulatory Visit (HOSPITAL_COMMUNITY): Payer: Self-pay | Admitting: Cardiology

## 2023-01-05 NOTE — Telephone Encounter (Signed)
Note faxed to [facility] per their request

## 2023-01-10 ENCOUNTER — Other Ambulatory Visit (HOSPITAL_COMMUNITY): Payer: Self-pay | Admitting: Cardiology

## 2023-01-13 ENCOUNTER — Ambulatory Visit (HOSPITAL_COMMUNITY)
Admission: RE | Admit: 2023-01-13 | Discharge: 2023-01-13 | Disposition: A | Discharge status: Home / Self Care | Payer: Medicare Other | Source: Ambulatory Visit | Attending: Cardiology | Admitting: Cardiology

## 2023-01-13 ENCOUNTER — Encounter (HOSPITAL_COMMUNITY): Payer: Self-pay | Admitting: Cardiology

## 2023-01-13 VITALS — BP 142/86 | HR 78 | Wt 167.0 lb

## 2023-01-13 DIAGNOSIS — I480 Paroxysmal atrial fibrillation: Secondary | ICD-10-CM | POA: Insufficient documentation

## 2023-01-13 DIAGNOSIS — R0789 Other chest pain: Secondary | ICD-10-CM | POA: Diagnosis not present

## 2023-01-13 DIAGNOSIS — Z8249 Family history of ischemic heart disease and other diseases of the circulatory system: Secondary | ICD-10-CM | POA: Insufficient documentation

## 2023-01-13 DIAGNOSIS — I509 Heart failure, unspecified: Secondary | ICD-10-CM

## 2023-01-13 DIAGNOSIS — R0609 Other forms of dyspnea: Secondary | ICD-10-CM | POA: Insufficient documentation

## 2023-01-13 DIAGNOSIS — Z823 Family history of stroke: Secondary | ICD-10-CM | POA: Diagnosis not present

## 2023-01-13 DIAGNOSIS — Z79899 Other long term (current) drug therapy: Secondary | ICD-10-CM | POA: Diagnosis not present

## 2023-01-13 DIAGNOSIS — I110 Hypertensive heart disease with heart failure: Secondary | ICD-10-CM | POA: Diagnosis not present

## 2023-01-13 DIAGNOSIS — R5383 Other fatigue: Secondary | ICD-10-CM | POA: Diagnosis not present

## 2023-01-13 DIAGNOSIS — I5032 Chronic diastolic (congestive) heart failure: Secondary | ICD-10-CM | POA: Diagnosis not present

## 2023-01-13 DIAGNOSIS — Z950 Presence of cardiac pacemaker: Secondary | ICD-10-CM | POA: Diagnosis not present

## 2023-01-13 DIAGNOSIS — I4581 Long QT syndrome: Secondary | ICD-10-CM | POA: Diagnosis not present

## 2023-01-13 DIAGNOSIS — I471 Supraventricular tachycardia, unspecified: Secondary | ICD-10-CM | POA: Diagnosis not present

## 2023-01-13 DIAGNOSIS — I442 Atrioventricular block, complete: Secondary | ICD-10-CM | POA: Insufficient documentation

## 2023-01-13 LAB — COMPREHENSIVE METABOLIC PANEL
ALT: 20 U/L (ref 0–44)
AST: 20 U/L (ref 15–41)
Albumin: 4 g/dL (ref 3.5–5.0)
Alkaline Phosphatase: 116 U/L (ref 38–126)
Anion gap: 8 (ref 5–15)
BUN: 6 mg/dL — ABNORMAL LOW (ref 8–23)
CO2: 26 mmol/L (ref 22–32)
Calcium: 9.4 mg/dL (ref 8.9–10.3)
Chloride: 105 mmol/L (ref 98–111)
Creatinine, Ser: 0.73 mg/dL (ref 0.44–1.00)
GFR, Estimated: >60 mL/min (ref >60)
Glucose, Bld: 92 mg/dL (ref 70–99)
Potassium: 4 mmol/L (ref 3.5–5.1)
Sodium: 139 mmol/L (ref 135–145)
Total Bilirubin: 0.9 mg/dL (ref 0.3–1.2)
Total Protein: 6.9 g/dL (ref 6.5–8.1)

## 2023-01-13 LAB — CBC
HCT: 40.8 % (ref 36.0–46.0)
Hemoglobin: 13 g/dL (ref 12.0–15.0)
MCH: 26.1 pg (ref 26.0–34.0)
MCHC: 31.9 g/dL (ref 30.0–36.0)
MCV: 81.9 fL (ref 80.0–100.0)
Platelets: 268 10*3/uL (ref 150–400)
RBC: 4.98 MIL/uL (ref 3.87–5.11)
RDW: 13.1 % (ref 11.5–15.5)
WBC: 6 10*3/uL (ref 4.0–10.5)
nRBC: 0 % (ref 0.0–0.2)

## 2023-01-13 LAB — T4, FREE: Free T4: 0.76 ng/dL (ref 0.61–1.12)

## 2023-01-13 LAB — TSH: TSH: 1.133 u[IU]/mL (ref 0.350–4.500)

## 2023-01-13 MED ORDER — DILTIAZEM HCL ER COATED BEADS 360 MG PO CP24: 360.0000 mg | ORAL_CAPSULE | Freq: Every morning | ORAL | 3 refills | Status: DC

## 2023-01-13 NOTE — Patient Instructions (Signed)
INCREASE Diltazem to 360 mg daily.  Labs done today, your results will be available in MyChart, we will contact you for abnormal readings.  Your physician recommends that you schedule a follow-up appointment in: 6 months ( February 2025) ** PLEASE CALL THE OFFICE IN NOVEMBER TO ARRANGE YOUR FOLLOW UP APPOINTMENT. **  If you have any questions or concerns before your next appointment please send [place] a message through [place] or call our office at [phone].    TO LEAVE A MESSAGE FOR THE NURSE SELECT OPTION 2, PLEASE LEAVE A MESSAGE INCLUDING: YOUR NAME DATE OF BIRTH CALL BACK NUMBER REASON FOR CALL**this is important as we prioritize the call backs  YOU WILL RECEIVE A CALL BACK THE SAME DAY AS LONG AS YOU CALL BEFORE 4:00 PM  At the [facility], you and your health needs are our priority. As part of our continuing mission to provide you with exceptional heart care, we have created designated Provider Care Teams. These Care Teams include your primary Cardiologist (physician) and Advanced Practice Providers (APPs- Physician Assistants and Nurse Practitioners) who all work together to provide you with the care you need, when you need it.   You may see any of the following providers on your designated Care Team at your next follow up: Dr Arvilla Meres Dr Marca Ancona Dr. Marcos Eke, NP Robbie Lis, [place] [facility] [place], [place] Brynda Peon, NP Karle Plumber, PharmD   Please be sure to bring in all your medications bottles to every appointment.    Thank you for choosing [facility]
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
## 2023-01-18 DIAGNOSIS — H2511 Age-related nuclear cataract, right eye: Secondary | ICD-10-CM | POA: Diagnosis not present

## 2023-01-19 ENCOUNTER — Ambulatory Visit: Payer: Medicare Other | Attending: Internal Medicine | Admitting: Internal Medicine

## 2023-01-19 VITALS — BP 126/70 | HR 71 | Ht 63.0 in | Wt 170.0 lb

## 2023-01-19 DIAGNOSIS — I5032 Chronic diastolic (congestive) heart failure: Secondary | ICD-10-CM
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
## 2023-01-19 NOTE — Patient Instructions (Addendum)
Medication Instructions:  Your physician recommends that you continue on your current medications as directed. Please refer to the Current Medication list given to you today.  *If you need a refill on your cardiac medications before your next appointment, please call your pharmacy*  Lab Work: None ordered.  If you have labs (blood work) drawn today and your tests are completely normal, you will receive your results only by: MyChart Message (if you have MyChart) OR A paper copy in the mail If you have any lab test that is abnormal or we need to change your treatment, we will call you to review the results.  Testing/Procedures: None ordered.  Follow-Up: At [facility], you and your health needs are our priority.  As part of our continuing mission to provide you with exceptional heart care, we have created designated Provider Care Teams.  These Care Teams include your primary Cardiologist (physician) and Advanced Practice Providers (APPs -  Physician Assistants and Nurse Practitioners) who all work together to provide you with the care you need, when you need it.  We recommend signing up for the patient portal called "MyChart".  Sign up information is provided on this After Visit Summary.  MyChart is used to connect with patients for Virtual Visits (Telemedicine).  Patients are able to view lab/test results, encounter notes, upcoming appointments, etc.  Non-urgent messages can be sent to your provider as well.   To learn more about what you can do with MyChart, go to [email or web address].    Your next appointment:   1 year(s)  The format for your next appointment:   In Person  Provider:   Lewayne Bunting, MD{or one of the following Advanced Practice Providers on your designated Care Team:   Francis Dowse, [place] Casimiro Needle "Mardelle Matte" [place], [place] Earnest Rosier, NP  Remote monitoring is used to monitor your Pacemaker/ ICD from home. This monitoring reduces the number of office visits required  to check your device to one time per year. It allows [place] to keep an eye on the functioning of your device to ensure it is working properly. You are scheduled for a device check from home on 02/06/23. You may send your transmission at any time that day. If you have a wireless device, the transmission will be sent automatically. After your physician reviews your transmission, you will receive a postcard with your next transmission date.  Important Information About Sugar

## 2023-02-06 ENCOUNTER — Ambulatory Visit (INDEPENDENT_AMBULATORY_CARE_PROVIDER_SITE_OTHER): Payer: Medicare Other

## 2023-02-06 DIAGNOSIS — R55 Syncope and collapse: Secondary | ICD-10-CM

## 2023-02-06 LAB — CUP PACEART REMOTE DEVICE CHECK
Battery Voltage: 50
Date Time Interrogation Session: 20240826092425
Implantable Lead Connection Status: 753985
Implantable Lead Connection Status: 753985
Implantable Lead Implant Date: 20170921
Implantable Lead Implant Date: 20170921
Implantable Lead Location: 753859
Implantable Lead Location: 753860
Implantable Lead Model: 377
Implantable Lead Model: 377
Implantable Lead Serial Number: 49575519
Implantable Lead Serial Number: 49580836
Implantable Pulse Generator Implant Date: 20170921
Pulse Gen Model: 407145
Pulse Gen Serial Number: 68850891

## 2023-02-15 ENCOUNTER — Telehealth: Payer: Self-pay

## 2023-02-15 NOTE — Progress Notes (Signed)
Remote pacemaker transmission.   

## 2023-02-15 NOTE — Telephone Encounter (Signed)
From my standpoint, if nothing was seen on device interrogation this was likely a vagal event.  See if Dr. Ladona Ridgel has any other thoughts.

## 2023-02-15 NOTE — Telephone Encounter (Signed)
Pt called in wanting to know if someone can call her about her remote transmission on 02/06/2023. She had to call ems on 01/30/2023 and she wants to know if anything was seen out of the normal. Pt states that they have done 3 EKGs and she is concerned

## 2023-02-15 NOTE — Telephone Encounter (Signed)
She has a dual chamber PPM, bothered by Vpacing so programmed AAIR.   States she had diarrhea, collapsed/ "nearly" passed out on 01/30/23 approx 2am. Things became black in her vision, she went to the floor before coming to fully.  Called 911. EMS arrived told her she had "Long QT" and should go to the hospital.  Patient refused.   She is calling today to see what correlates with her device.  We did not receive any transmission alerts on 8/19 for patient.  8/26 remote transmission shows normal device function and no episodes.  I do not see anything that correlates with her event.  She has not had any events since then.   Patient is anxious and wants to know if something is going on with her heart. As this happened after a bowel movement, I am suspicious if could be a vasovagal event.  Patient insists something is wrong with her heart and wants to know next steps. States this is the same thing that would happen before having the pacemaker placed.  This is first time it has happened since having it implanted. She denies any further symptoms since episode on 8/19.   Forwarding to Dr. Ladona Ridgel and to Dr. Shirlee Latch for review and next steps.  Patient refuses to follow up with her PCP as well, as states she knows this is a heart only problem and wants to here from cardiology first. Patient given ER precautions if sx's re-occur.  She verbalizes understanding.

## 2023-02-19 NOTE — Telephone Encounter (Signed)
Have her come in for a 12 lead ecg and a bmp nurse visit. Additional rec's to follow. GT

## 2023-02-20 ENCOUNTER — Other Ambulatory Visit: Payer: Self-pay

## 2023-02-20 DIAGNOSIS — I5032 Chronic diastolic (congestive) heart failure: Secondary | ICD-10-CM

## 2023-02-20 DIAGNOSIS — R079 Chest pain, unspecified: Secondary | ICD-10-CM

## 2023-02-20 DIAGNOSIS — I10 Essential (primary) hypertension: Secondary | ICD-10-CM

## 2023-02-20 NOTE — Telephone Encounter (Signed)
Called pt. Set up NV for 02/23/23 with lab to follow. Order for BMP placed. Notified Triage TL for that day to make sure pt gets to lab after.

## 2023-02-21 ENCOUNTER — Telehealth: Payer: Self-pay | Admitting: Internal Medicine

## 2023-02-21 NOTE — Telephone Encounter (Addendum)
Spoke with patient and she states she is scheduled for cataract surgery tomorrow and wanted to know if you feel she should not have her cataract surgery tomorrow. She was cleared by Dr. Jearld Pies, but feels since Dr. Ladona Ridgel called and wanted lab work and an EKG she felt something changed. She is aware Dr. Ladona Ridgel is not in today. She may want to reach out to Dr. Shirlee Latch as well

## 2023-02-21 NOTE — Telephone Encounter (Signed)
Unable to leave voicemail. Mailbox full

## 2023-02-21 NOTE — Telephone Encounter (Signed)
Patient returned LPN's call. 

## 2023-02-21 NOTE — Telephone Encounter (Signed)
Patient wants to know if it is safe for her to have cataract surgery.  Patient requested a response today as her surgery is scheduled for tomorrow (9/11).

## 2023-02-22 DIAGNOSIS — H2512 Age-related nuclear cataract, left eye: Secondary | ICD-10-CM | POA: Diagnosis not present

## 2023-02-23 ENCOUNTER — Other Ambulatory Visit: Payer: Self-pay | Admitting: Internal Medicine

## 2023-02-23 ENCOUNTER — Ambulatory Visit: Payer: Medicare Other | Attending: Cardiology | Admitting: *Deleted

## 2023-02-23 ENCOUNTER — Ambulatory Visit: Payer: Medicare Other

## 2023-02-23 VITALS — BP 130/74 | Resp 16

## 2023-02-23 DIAGNOSIS — I10 Essential (primary) hypertension: Secondary | ICD-10-CM

## 2023-02-23 DIAGNOSIS — R079 Chest pain, unspecified: Secondary | ICD-10-CM

## 2023-02-23 DIAGNOSIS — R55 Syncope and collapse: Secondary | ICD-10-CM | POA: Diagnosis not present

## 2023-02-23 DIAGNOSIS — I5032 Chronic diastolic (congestive) heart failure: Secondary | ICD-10-CM

## 2023-02-23 NOTE — Progress Notes (Signed)
   Nurse Visit   Date of Encounter: 02/23/2023 ID: Renee [name], DOB 06/18/1945, MRN [id number]  PCP:  Corwin Levins, MD   [facility] Providers Cardiologist:  None Advanced Heart Failure:  Marca Ancona, MD      Visit Details   VS:  There were no vitals taken for this visit. , BMI There is no height or weight on file to calculate BMI.  Wt Readings from Last 3 Encounters:  01/19/23 170 lb (77.1 kg)  01/13/23 167 lb (75.8 kg)  12/09/22 167 lb (75.8 kg)     Reason for visit: EKG Performed today: Vitals, EKG, Provider consulted:Dr. Anne Fu, Dr. Ladona Ridgel, and Education Changes (medications, testing, etc.) : no changes Length of Visit: 15 minutes    Medications Adjustments/Labs and Tests Ordered: Orders Placed This Encounter  Procedures   EKG 12-Lead   No orders of the defined types were placed in this encounter.    Jerald Kief, RN  02/23/2023 3:59 PM

## 2023-02-27 LAB — BASIC METABOLIC PANEL
BUN/Creatinine Ratio: 12
BUN: 9 mg/dL
CO2: 27 mmol/L (ref 20–29)
Calcium: 10.4 mg/dL
Chloride: 104 mmol/L (ref 96–106)
Creatinine, Ser: 0.78 mg/dL
Glucose: 83 mg/dL (ref 70–99)
Potassium: 4 mmol/L (ref 3.5–5.2)
Sodium: 139 mmol/L (ref 134–144)

## 2023-03-08 ENCOUNTER — Other Ambulatory Visit (INDEPENDENT_AMBULATORY_CARE_PROVIDER_SITE_OTHER): Payer: Medicare Other

## 2023-03-08 ENCOUNTER — Telehealth: Payer: Self-pay | Admitting: Internal Medicine

## 2023-03-08 ENCOUNTER — Encounter: Payer: Self-pay | Admitting: Internal Medicine

## 2023-03-08 DIAGNOSIS — N3001 Acute cystitis with hematuria: Secondary | ICD-10-CM

## 2023-03-08 DIAGNOSIS — R300 Dysuria: Secondary | ICD-10-CM | POA: Diagnosis not present

## 2023-03-08 LAB — URINALYSIS, ROUTINE W REFLEX MICROSCOPIC
Bilirubin Urine: NEGATIVE
Ketones, ur: NEGATIVE
Nitrite: NEGATIVE
RBC / HPF: NONE SEEN (ref 0–?)
Specific Gravity, Urine: 1.02 (ref 1.000–1.030)
Total Protein, Urine: NEGATIVE
Urine Glucose: NEGATIVE
Urobilinogen, UA: 0.2 (ref 0.0–1.0)
pH: 6 (ref 5.0–8.0)

## 2023-03-08 MED ORDER — CEPHALEXIN 500 MG PO CAPS: 500.0000 mg | ORAL_CAPSULE | Freq: Two times a day (BID) | ORAL | 0 refills | Status: DC

## 2023-03-08 NOTE — Telephone Encounter (Signed)
Pt called and ask if Dr. Jonny Ruiz to send any medication in can he please send it  to [facility]  [address] [place], [place] [address]

## 2023-03-08 NOTE — Progress Notes (Signed)
Ok to let pt know - the urine testing is likley c/w uti  - for cephalexin course  - done erx

## 2023-03-08 NOTE — Telephone Encounter (Signed)
Ok for [facility] and cx - orders done

## 2023-03-08 NOTE — Telephone Encounter (Signed)
Called and let Pt know

## 2023-03-08 NOTE — Telephone Encounter (Signed)
Patient would like to know if she can go to the lab for a urinalysis. She said she thinks she has a UTI. There is a burning sensation when urinating and pressure on her bladder. There are no openings with our office today and patient said she has had a death in the family and is dealing with funeral planning. She would like to come in ASAP so she can focus on that. Patient would like a call back as soon as possible at [phone].

## 2023-03-09 LAB — URINE CULTURE

## 2023-03-09 NOTE — Telephone Encounter (Signed)
Ok noted. thanks

## 2023-03-09 NOTE — Telephone Encounter (Signed)
Patient called and is very upset. She was yelling and demanding that she get a response from her doctor about this mistake. She said she sent a message via MyChart about the issue. She said her medication was supposed to be sent to the [facility] pharmacy on [address] and it was sent to a different pharmacy.  She now wants [facility] sent to the [facility] [address] in [place], [place]. She has demanded that it be filled by 3 pm today. She was informed that we cannot control what time the pharmacy fills the medication.

## 2023-03-09 NOTE — Telephone Encounter (Signed)
Patient called back and said her daughter is going to go to the [facility] pharmacy to pick up her medication. She no longer needs it re-sent.

## 2023-03-14 DIAGNOSIS — M25511 Pain in right shoulder: Secondary | ICD-10-CM | POA: Diagnosis not present

## 2023-03-14 DIAGNOSIS — M19011 Primary osteoarthritis, right shoulder: Secondary | ICD-10-CM | POA: Diagnosis not present

## 2023-04-03 ENCOUNTER — Ambulatory Visit: Payer: Medicare Other | Admitting: Family Medicine

## 2023-04-03 ENCOUNTER — Encounter: Payer: Self-pay | Admitting: Family Medicine

## 2023-04-03 VITALS — BP 122/70 | HR 60 | Temp 98.2°F | Resp 20 | Ht 63.0 in | Wt 169.0 lb

## 2023-04-03 DIAGNOSIS — R8290 Unspecified abnormal findings in urine: Secondary | ICD-10-CM

## 2023-04-03 DIAGNOSIS — N958 Other specified menopausal and perimenopausal disorders: Secondary | ICD-10-CM

## 2023-04-03 DIAGNOSIS — J069 Acute upper respiratory infection, unspecified: Secondary | ICD-10-CM | POA: Diagnosis not present

## 2023-04-03 LAB — POCT URINALYSIS DIPSTICK
Bilirubin, UA: NEGATIVE
Blood, UA: NEGATIVE
Glucose, UA: NEGATIVE
Ketones, UA: NEGATIVE
Nitrite, UA: NEGATIVE
Protein, UA: NEGATIVE
Spec Grav, UA: 1.025 (ref 1.010–1.025)
Urobilinogen, UA: NEGATIVE U/dL — AB
pH, UA: 6 (ref 5.0–8.0)

## 2023-04-03 LAB — POCT INFLUENZA A/B
Influenza A, POC: NEGATIVE
Influenza B, POC: NEGATIVE

## 2023-04-03 LAB — POC COVID19 BINAXNOW: SARS Coronavirus 2 Ag: NEGATIVE

## 2023-04-03 MED ORDER — ALBUTEROL SULFATE HFA 108 (90 BASE) MCG/ACT IN AERS: 2.0000 | INHALATION_SPRAY | Freq: Four times a day (QID) | RESPIRATORY_TRACT | 0 refills | Status: DC | PRN

## 2023-04-03 NOTE — Progress Notes (Signed)
Assessment & Plan:  1. Viral URI Education provided on viral URI.  Discussed typical duration and progression of viral illnesses.  Encouraged symptom management including throat lozenges, chloraseptic spray, warm salt water gargles, hot tea/honey, cough syrup (Delsym), Tylenol Cold Day and Night, Tylenol/Ibuprofen, Vicks, and a humidifier at night.  - POCT Influenza A/B - POC COVID-19 BinaxNow  2. Genitourinary syndrome of menopause Discussed symptoms and suggested topical estrogen cream which patient declined as she does not believe that creams are effective.  3.  Cloudy urine Encouraged fluids. - POCT urinalysis dipstick Results for orders placed or performed in visit on 04/03/23  POCT Influenza A/B  Result Value Ref Range   Influenza A, POC Negative Negative   Influenza B, POC Negative Negative  POC COVID-19 BinaxNow  Result Value Ref Range   SARS Coronavirus 2 Ag Negative Negative  POCT urinalysis dipstick  Result Value Ref Range   Color, UA yellow    Clarity, UA clear    Glucose, UA Negative Negative   Bilirubin, UA neg    Ketones, UA neg    Spec Grav, UA 1.025 1.010 - 1.025   Blood, UA neg    pH, UA 6.0 5.0 - 8.0   Protein, UA Negative Negative   Urobilinogen, UA negative (A) 0.2 or 1.0 E.U./dL   Nitrite, UA neg    Leukocytes, UA Moderate (2+) (A) Negative   Appearance     Odor      No results found for any visits on 04/03/23.  Follow up plan: Return if symptoms worsen or fail to improve.  Deliah Boston, MSN, APRN, FNP-C  Subjective:  HPI: Renee [name] is a 77 y.o. female presenting on 04/03/2023 for URI (Cough, SOB, and weakness - started during the night on Saturday. Woke Sunday with ST, better now. /No fever. )  Patient complains of cough, runny nose, sneezing, sore throat, shortness of breath, and weakness . She denies head/chest congestion and fever. Onset of symptoms was 2 days ago, gradually worsening since that time. She is not drinking much.  Evaluation to date: none. Treatment to date: antihistamines. She does not smoke.   Patient complains of  dark and cloudy urine . She has had symptoms for 1 week. Patient denies fever. Patient does not have a history of recurrent UTI.  Patient does not have a history of pyelonephritis. Her past two urine cultures have not grown any bacteria.     ROS: Negative unless specifically indicated above in HPI.   Relevant past medical history reviewed and updated as indicated.   Allergies and medications reviewed and updated.   Current Outpatient Medications:    ACCU-CHEK FASTCLIX LANCETS MISC, USE UP TO 4 TIMES DAILY AS  DIRECTED, Disp: 408 each, Rfl: 1   ACCU-CHEK GUIDE test strip, USE UP TO 4 TIMES DAILY AS  DIRECTED, Disp: 100 each, Rfl: 1   ALPRAZolam (XANAX) 0.5 MG tablet, Take 0.5-1 tablets (0.25-0.5 mg total) by mouth daily as needed for anxiety., Disp: 90 tablet, Rfl: 1   aspirin EC 81 MG tablet, Take 81 mg by mouth daily., Disp: , Rfl:    blood glucose meter kit and supplies KIT, Dispense per patient and insurance preferred. Use up to four times daily as directed. E11.9, Disp: 1 each, Rfl: 0   cetirizine (ZYRTEC) 10 MG tablet, Take 10 mg by mouth daily as needed., Disp: , Rfl:    diltiazem (CARDIZEM CD) 360 MG 24 hr capsule, Take 1 capsule (360 mg total) by  mouth every morning., Disp: 90 capsule, Rfl: 3   diltiazem (CARDIZEM) 30 MG tablet, Take 2 tablets (60 mg total) by mouth 4 (four) times daily as needed (as needed for breakthrough heart racing/palpitations)., Disp: 90 tablet, Rfl: 0   flecainide (TAMBOCOR) 50 MG tablet, TAKE 1 TABLET BY MOUTH TWICE  DAILY, Disp: 200 tablet, Rfl: 2   furosemide (LASIX) 20 MG tablet, TAKE 1 TABLET BY MOUTH DAILY AS  NEEDED FOR FLUID, Disp: 90 tablet, Rfl: 3   losartan (COZAAR) 50 MG tablet, TAKE 1 TABLET BY MOUTH TWICE  DAILY, Disp: 200 tablet, Rfl: 2   omeprazole (PRILOSEC) 40 MG capsule, TAKE 1 CAPSULE BY MOUTH IN  THE MORNING AND AT BEDTIME, Disp: 180  capsule, Rfl: 3   trimethoprim (TRIMPEX) 100 MG tablet, Take 100 mg by mouth daily as needed (For UTI per patient)., Disp: , Rfl:    nitroGLYCERIN (NITROSTAT) 0.4 MG SL tablet, DISSOLVE 1 TABLET UNDER THE  TONGUE EVERY 5 MINUTES AS NEEDED FOR CHEST PAIN. MAX OF 3 TABLETS IN 15 MINUTES. CALL 911 IF PAIN  PERSISTS. (Patient not taking: Reported on 04/03/2023), Disp: 125 tablet, Rfl: 2   zolpidem (AMBIEN) 5 MG tablet, TAKE 1 TABLET BY MOUTH AT  BEDTIME AS NEEDED FOR SLEEP (Patient not taking: Reported on 04/03/2023), Disp: 90 tablet, Rfl: 1  Allergies  Allergen Reactions   Cyclobenzaprine Other (See Comments)    Pt states that this medication does not work for her.     Azithromycin Other (See Comments)    Cannot take due to prolonged QT   Nitrofurantoin Other (See Comments)   Adhesive [Tape] Itching, Rash and Other (See Comments)    Reaction:  Blisters  Pt states that she is allergic to the adhesive backing of EKG pads if left on for more than 24 hours Patient states she tolerates paper tape ok   Moxifloxacin Other (See Comments)    Unknown allergic reaction many years ago   Quinolones Other (See Comments)    Unknown allergic reaction many years ago    Objective:   BP 122/70   Pulse 60   Temp 98.2 F (36.8 C)   Resp 20   Ht 5\' 3"  (1.6 m)   Wt 169 lb (76.7 kg)   SpO2 98%   BMI 29.94 kg/m    Physical Exam Vitals reviewed.  Constitutional:      General: She is not in acute distress.    Appearance: Normal appearance. She is not ill-appearing, toxic-appearing or diaphoretic.  HENT:     Head: Normocephalic and atraumatic.     Right Ear: Tympanic membrane, ear canal and external ear normal. There is no impacted cerumen.     Left Ear: Tympanic membrane, ear canal and external ear normal. There is no impacted cerumen.     Nose: Congestion present. No rhinorrhea.     Right Sinus: No maxillary sinus tenderness or frontal sinus tenderness.     Left Sinus: No maxillary sinus tenderness  or frontal sinus tenderness.     Mouth/Throat:     Mouth: Mucous membranes are moist.     Pharynx: Oropharynx is clear. No oropharyngeal exudate or posterior oropharyngeal erythema.  Eyes:     General: No scleral icterus.       Right eye: No discharge.        Left eye: No discharge.     Conjunctiva/sclera: Conjunctivae normal.  Cardiovascular:     Rate and Rhythm: Normal rate and regular rhythm.  Heart sounds: Normal heart sounds. No murmur heard.    No friction rub. No gallop.  Pulmonary:     Effort: Pulmonary effort is normal. No respiratory distress.     Breath sounds: Normal breath sounds. No stridor. No wheezing, rhonchi or rales.  Musculoskeletal:        General: Normal range of motion.     Cervical back: Normal range of motion.  Lymphadenopathy:     Cervical: No cervical adenopathy.  Skin:    General: Skin is warm and dry.     Capillary Refill: Capillary refill takes less than 2 seconds.  Neurological:     General: No focal deficit present.     Mental Status: She is alert and oriented to person, place, and time. Mental status is at baseline.  Psychiatric:        Mood and Affect: Mood normal.        Behavior: Behavior normal.        Thought Content: Thought content normal.        Judgment: Judgment normal.

## 2023-04-03 NOTE — Patient Instructions (Addendum)
Throat lozenges, chloraseptic spray, warm salt water gargles, hot tea/honey, cough syrup (Delsym), Tylenol Cold Day and Night, Tylenol/Ibuprofen, Vicks, and a humidifier at night.    Genitourinary syndrome of menopause seems to be causing your bladder symptoms.

## 2023-04-06 ENCOUNTER — Encounter (HOSPITAL_COMMUNITY): Payer: Self-pay | Admitting: Cardiology

## 2023-04-07 ENCOUNTER — Encounter: Payer: Self-pay | Admitting: Internal Medicine

## 2023-04-07 ENCOUNTER — Ambulatory Visit (INDEPENDENT_AMBULATORY_CARE_PROVIDER_SITE_OTHER): Payer: Medicare Other | Admitting: Internal Medicine

## 2023-04-07 VITALS — BP 124/76 | HR 70 | Temp 98.2°F | Ht 63.0 in | Wt 166.0 lb

## 2023-04-07 DIAGNOSIS — I10 Essential (primary) hypertension: Secondary | ICD-10-CM | POA: Diagnosis not present

## 2023-04-07 DIAGNOSIS — R739 Hyperglycemia, unspecified: Secondary | ICD-10-CM | POA: Diagnosis not present

## 2023-04-07 DIAGNOSIS — J069 Acute upper respiratory infection, unspecified: Secondary | ICD-10-CM | POA: Diagnosis not present

## 2023-04-07 DIAGNOSIS — Z0001 Encounter for general adult medical examination with abnormal findings: Secondary | ICD-10-CM

## 2023-04-07 DIAGNOSIS — E559 Vitamin D deficiency, unspecified: Secondary | ICD-10-CM | POA: Diagnosis not present

## 2023-04-07 MED ORDER — CEPHALEXIN 500 MG PO CAPS: 500.0000 mg | ORAL_CAPSULE | Freq: Three times a day (TID) | ORAL | 0 refills | Status: DC

## 2023-04-07 MED ORDER — HYDROCODONE BIT-HOMATROP MBR 5-1.5 MG/5ML PO SOLN: 5.0000 mL | Freq: Four times a day (QID) | ORAL | 0 refills | Status: AC | PRN

## 2023-04-07 NOTE — Assessment & Plan Note (Signed)
BP Readings from Last 3 Encounters:  04/07/23 124/76  04/03/23 122/70  02/23/23 130/74   Stable, pt to continue medical treatment card 360 qd

## 2023-04-07 NOTE — Assessment & Plan Note (Signed)
Age and sex appropriate education and counseling updated with regular exercise and diet Referrals for preventative services - pt to call for yearly eye exam Immunizations addressed - for shingrix at pharmacy Smoking counseling  - none needed Evidence for depression or other mood disorder - chronic anxiety depression stable Most recent labs reviewed. I have personally reviewed and have noted: 1) the patient's medical and social history 2) The patient's current medications and supplements 3) The patient's height, weight, and BMI have been recorded in the chart

## 2023-04-07 NOTE — Progress Notes (Signed)
Patient ID: Renee [name], female   DOB: Mar 29, 1946, 77 y.o.   MRN: [id number]          Chief Complaint:: wellness exam and URI (Was seen on Monday and hasn't gotten better , would like to know if she is dehydrated )  , low vit d, hyperglycemia, htn       HPI:  Renee [name] is a 77 y.o. female here for wellness exam; pt to call for yearly eye exam, for shingrix at pharmacy, o/w up to date                        Also was seen 4 days ago for now total 6 days URI symptoms worsening with facial pain, pressure, feverish, worsening fatigue and malaise, bilateral ear pain , ST and cough, but Pt denies chest pain, increased sob or doe, wheezing, orthopnea, PND, increased LE swelling, palpitations, dizziness or syncope.   Pt denies polydipsia, polyuria, or new focal neuro s/s.    Pt denies fever, wt loss, night sweats, loss of appetite, or other constitutional symptoms  Pt adamant Dr Kriste Basque always gave her keflex course.  Denies worsening depressive symptoms, suicidal ideation, or panic; has ongoing anxiety, not increased recently.   Decliens further labs today, too fatigued   Wt Readings from Last 3 Encounters:  04/07/23 166 lb (75.3 kg)  04/03/23 169 lb (76.7 kg)  01/19/23 170 lb (77.1 kg)   BP Readings from Last 3 Encounters:  04/07/23 124/76  04/03/23 122/70  02/23/23 130/74   Immunization History  Administered Date(s) Administered   Fluad Quad(high Dose 65+) 03/05/2019, 04/30/2020, 03/17/2021, 03/23/2022   Influenza Split 03/14/2011, 04/02/2013   Influenza Whole 03/11/2009, 03/29/2010   Influenza, High Dose Seasonal PF 04/30/2015, 03/21/2017, 03/24/2018   Influenza,inj,Quad PF,6+ Mos 06/11/2014, 03/04/2016   Influenza-Unspecified 06/14/2015, 03/13/2018   PFIZER(Purple Top)SARS-COV-2 Vaccination 07/20/2019, 08/10/2019   Pneumococcal Conjugate-13 12/18/2014   Pneumococcal Polysaccharide-23 09/07/2015   Tdap 09/28/2013   Health Maintenance Due  Topic Date Due   Zoster Vaccines-  Shingrix (1 of 2) Never done   OPHTHALMOLOGY EXAM  05/03/2022   HEMOGLOBIN A1C  09/22/2022   Diabetic kidney evaluation - Urine ACR  03/24/2023      Past Medical History:  Diagnosis Date   Allergy    tape, electrodes   Anxiety    AV nodal re-entry tachycardia (HCC)    a. s/p RFA 1993- Dr. Delena Serve, [facility]   Broken neck [facility])    1993   Cervical cancer (HCC)    hx of cervical   ca   CHF (congestive heart failure) (HCC)    Chronic chest pain    a. 2005 Cath: Mild nonobstructive plaque (30-40% LCX);  b. 08/2011 low risk myoview;  c. 09/2011 Coronary CT: NL cors w ca score of 0.   Chronic pain syndrome    Colon polyps    Complication of anesthesia    blood pressure and heart dropped in 2009     Concussion    1979, 1993   Degenerative joint disease    a. s/p L Total Knee Arthroplasty.   Diet-controlled type 2 diabetes mellitus (HCC)    pt. denies   Diverticulosis of colon    Duodenitis without hemorrhage    Dysrhythmia    REPORTS , SINCE PLACEMENT OF PACEMAKER, STILL CAN FEEL HEART " GOING IN AND OUT OF [facility]" REPORTS, YESTERDAY HER BP SYSTOLIC WAS IN THE 200s AND WAS BREATHLESS, HR 112, SWEATING ;  DENIES SYNCOPE  , DENIES CHEST PAIN    Fibromyalgia    GERD (gastroesophageal reflux disease)    Hemorrhoids    Hypertension    Irritable bowel syndrome    Long QT interval    a. mild - advised to avoid meds that may prolong QT.   Myocardial infarction [facility]) 09/1991   does not know when second heart attack was   Neuropathy    Osteoporosis    Paroxysmal A-fib (HCC)    PER PATIENT. I CAN FEEL MY HEART RACING WHEN IM IN IT    Recurrent upper respiratory infection (URI)    SVT (supraventricular tachycardia) (HCC)    a. nonsustained SVT - previously offered flecainide but refused;  b. 12/2012 30 day event monitor w/o significant arrhythmias.   Thyroid nodule    Past Surgical History:  Procedure Laterality Date   ABLATION OF DYSRHYTHMIC FOCUS  1993   ANTERIOR AND POSTERIOR REPAIR N/A  08/01/2017   Procedure: ANTERIOR (CYSTOCELE) AND POSTERIOR REPAIR (RECTOCELE);  Surgeon: Alfredo Martinez, MD;  Location: [facility];  Service: Urology;  Laterality: N/A;   APPENDECTOMY     1980   BILATERAL SALPINGECTOMY Left 08/01/2017   Procedure: SALPINGECTOMY;  Surgeon: Carrington Clamp, MD;  Location: [facility];  Service: Gynecology;  Laterality: Left;   BIOPSY THYROID     CARDIAC ELECTROPHYSIOLOGY STUDY AND ABLATION     atrioventricular nodal reentant tachycardia   CHOLECYSTECTOMY  1980   COLONOSCOPY  06/25/2008   Diverticulosis and Hemorrhoids   COLPOSCOPY     CORONARY ANGIOPLASTY  2007   CYSTOSCOPY N/A 08/01/2017   Procedure: CYSTOSCOPY;  Surgeon: Alfredo Martinez, MD;  Location: [facility];  Service: Urology;  Laterality: N/A;   EP IMPLANTABLE DEVICE N/A 03/03/2016   Procedure: Pacemaker Implant;  Surgeon: Marinus Maw, MD;  Location: [facility] INVASIVE CV LAB;  Service: Cardiovascular;  Laterality: N/A;   ESOPHAGOGASTRODUODENOSCOPY     HYSTEROSCOPY  2002   with resection of endometrial polyps. by Dr.MCPhail   HYSTEROSCOPY WITH D & C N/A 02/05/2015   Procedure: DILATATION AND CURETTAGE /HYSTEROSCOPY;  Surgeon: Waynard Reeds, MD;  Location: [facility];  Service: Gynecology;  Laterality: N/A;   LOOP RECORDER IMPLANT  08/21/2013   MDT LinQ implanted by Dr Ladona Ridgel for syncope   LOOP RECORDER IMPLANT N/A 08/21/2013   Procedure: LOOP RECORDER IMPLANT;  Surgeon: Marinus Maw, MD;  Location: [facility] CATH LAB;  Service: Cardiovascular;  Laterality: N/A;   ORIF RADIAL HEAD / NECK FRACTURE  09/05/88   TILT TABLE STUDY N/A 05/15/2013   Procedure: TILT TABLE STUDY;  Surgeon: [facility] Salvia, MD;  Location: [facility] CATH LAB;  Service: Cardiovascular;  Laterality: N/A;   TOTAL HIP ARTHROPLASTY  10/10   Left, at [facility]   UPPER GASTROINTESTINAL ENDOSCOPY     VAGINAL HYSTERECTOMY N/A 08/01/2017   Procedure: HYSTERECTOMY VAGINAL;  Surgeon: Carrington Clamp, MD;  Location: [facility];  Service: Gynecology;  Laterality: N/A;     reports that she has never smoked. She has never used smokeless tobacco. She reports that she does not currently use alcohol. She reports that she does not use drugs. family history includes Asthma in her mother and sister; Diabetes in her brother, mother, and sister; Heart disease in her sister and sister; Lung cancer in her father; Ovarian cancer in her mother; Stroke in her mother. Allergies  Allergen Reactions   Cyclobenzaprine Other (See Comments)    Pt states that this medication does not work for her.  Azithromycin Other (See Comments)    Cannot take due to prolonged QT   Nitrofurantoin Other (See Comments)   Adhesive [Tape] Itching, Rash and Other (See Comments)    Reaction:  Blisters  Pt states that she is allergic to the adhesive backing of EKG pads if left on for more than 24 hours Patient states she tolerates paper tape ok   Moxifloxacin Other (See Comments)    Unknown allergic reaction many years ago   Quinolones Other (See Comments)    Unknown allergic reaction many years ago   Current Outpatient Medications on File Prior to Visit  Medication Sig Dispense Refill   ACCU-CHEK FASTCLIX LANCETS MISC USE UP TO 4 TIMES DAILY AS  DIRECTED 408 each 1   ACCU-CHEK GUIDE test strip USE UP TO 4 TIMES DAILY AS  DIRECTED 100 each 1   albuterol (VENTOLIN HFA) 108 (90 Base) MCG/ACT inhaler Inhale 2 puffs into the lungs every 6 (six) hours as needed for wheezing. 18 g 0   ALPRAZolam (XANAX) 0.5 MG tablet Take 0.5-1 tablets (0.25-0.5 mg total) by mouth daily as needed for anxiety. 90 tablet 1   aspirin EC 81 MG tablet Take 81 mg by mouth daily.     blood glucose meter kit and supplies KIT Dispense per patient and insurance preferred. Use up to four times daily as directed. E11.9 1 each 0   cetirizine (ZYRTEC) 10 MG tablet Take 10 mg by mouth daily as needed.     diltiazem (CARDIZEM CD) 360 MG 24 hr capsule Take 1 capsule (360 mg total) by mouth every morning. 90 capsule 3   diltiazem  (CARDIZEM) 30 MG tablet Take 2 tablets (60 mg total) by mouth 4 (four) times daily as needed (as needed for breakthrough heart racing/palpitations). 90 tablet 0   flecainide (TAMBOCOR) 50 MG tablet TAKE 1 TABLET BY MOUTH TWICE  DAILY 200 tablet 2   furosemide (LASIX) 20 MG tablet TAKE 1 TABLET BY MOUTH DAILY AS  NEEDED FOR FLUID 90 tablet 3   losartan (COZAAR) 50 MG tablet TAKE 1 TABLET BY MOUTH TWICE  DAILY 200 tablet 2   nitroGLYCERIN (NITROSTAT) 0.4 MG SL tablet DISSOLVE 1 TABLET UNDER THE  TONGUE EVERY 5 MINUTES AS NEEDED FOR CHEST PAIN. MAX OF 3 TABLETS IN 15 MINUTES. CALL 911 IF PAIN  PERSISTS. 125 tablet 2   omeprazole (PRILOSEC) 40 MG capsule TAKE 1 CAPSULE BY MOUTH IN  THE MORNING AND AT BEDTIME 180 capsule 3   trimethoprim (TRIMPEX) 100 MG tablet Take 100 mg by mouth daily as needed (For UTI per patient).     zolpidem (AMBIEN) 5 MG tablet TAKE 1 TABLET BY MOUTH AT  BEDTIME AS NEEDED FOR SLEEP 90 tablet 1   No current facility-administered medications on file prior to visit.        ROS:  All others reviewed and negative.  Objective        PE:  BP 124/76 (BP Location: Right Arm, Patient Position: Sitting, Cuff Size: Normal)   Pulse 70   Temp 98.2 F (36.8 C) (Oral)   Ht 5\' 3"  (1.6 m)   Wt 166 lb (75.3 kg)   SpO2 99%   BMI 29.41 kg/m                 Constitutional: Pt appears in NAD, mild  ill               HENT: Head: NCAT.  Right Ear: External ear normal.                 Left Ear: External ear normal. Bilat tm's with mild erythema.  Max sinus areas non tender.  Pharynx with mild erythema, no exudate               Eyes: . Pupils are equal, round, and reactive to light. Conjunctivae and EOM are normal               Nose: without d/c or deformity               Neck: Neck supple. Gross normal ROM               Cardiovascular: Normal rate and regular rhythm.                 Pulmonary/Chest: Effort normal and breath sounds without rales or wheezing.                 Abd:  Soft, NT, ND, + BS, no organomegaly               Neurological: Pt is alert. At baseline orientation, motor grossly intact               Skin: Skin is warm. No rashes, no other new lesions, LE edema - none               Psychiatric: Pt behavior is normal without agitation   Micro: none  Cardiac tracings I have personally interpreted today:  none  Pertinent Radiological findings (summarize): none   Lab Results  Component Value Date   WBC 6.0 01/13/2023   HGB 13.0 01/13/2023   HCT 40.8 01/13/2023   PLT 268 01/13/2023   GLUCOSE 83 02/23/2023   CHOL 162 03/23/2022   TRIG 67.0 03/23/2022   HDL 77.10 03/23/2022   LDLCALC 71 03/23/2022   ALT 20 01/13/2023   AST 20 01/13/2023   NA 139 02/23/2023   K 4.0 02/23/2023   CL 104 02/23/2023   CREATININE 0.78 02/23/2023   BUN 9 02/23/2023   CO2 27 02/23/2023   TSH 1.133 01/13/2023   INR 0.9 03/31/2020   HGBA1C 6.1 03/23/2022   MICROALBUR <0.7 03/23/2022   Assessment/Plan:  Renee [name] is a 77 y.o. White or Caucasian [1] female with  has a past medical history of Allergy, Anxiety, AV nodal re-entry tachycardia (HCC), Broken neck (HCC), Cervical cancer (HCC), CHF (congestive heart failure) (HCC), Chronic chest pain, Chronic pain syndrome, Colon polyps, Complication of anesthesia, Concussion, Degenerative joint disease, Diet-controlled type 2 diabetes mellitus (HCC), Diverticulosis of colon, Duodenitis without hemorrhage, Dysrhythmia, Fibromyalgia, GERD (gastroesophageal reflux disease), Hemorrhoids, Hypertension, Irritable bowel syndrome, Long QT interval, Myocardial infarction (HCC) (09/1991), Neuropathy, Osteoporosis, Paroxysmal A-fib (HCC), Recurrent upper respiratory infection (URI), SVT (supraventricular tachycardia) (HCC), and Thyroid nodule.  Encounter for well adult exam with abnormal findings Age and sex appropriate education and counseling updated with regular exercise and diet Referrals for preventative services - pt to  call for yearly eye exam Immunizations addressed - for shingrix at pharmacy Smoking counseling  - none needed Evidence for depression or other mood disorder - chronic anxiety depression stable Most recent labs reviewed. I have personally reviewed and have noted: 1) the patient's medical and social history 2) The patient's current medications and supplements 3) The patient's height, weight, and BMI have been recorded in the chart   Vitamin D deficiency Last vitamin D Lab  Results  Component Value Date   VD25OH 31.10 03/23/2022   Low, to start oral replacement   Hyperglycemia Lab Results  Component Value Date   HGBA1C 6.1 03/23/2022   Stable, pt to continue current medical treatment  - diet,wt control   Essential hypertension BP Readings from Last 3 Encounters:  04/07/23 124/76  04/03/23 122/70  02/23/23 130/74   Stable, pt to continue medical treatment card 360 qd   Acute upper respiratory infection Mild to mod, for antibx course cephalexin 500 tid, cough med prn,  to f/u any worsening symptoms or concerns  Followup: Return in about 3 months (around 07/08/2023).  Oliver Barre, MD 04/07/2023 3:31 PM [facility]

## 2023-04-07 NOTE — Assessment & Plan Note (Signed)
Last vitamin D Lab Results  Component Value Date   VD25OH 31.10 03/23/2022   Low, to start  oral replacement

## 2023-04-07 NOTE — Patient Instructions (Addendum)
Please take all new medication as prescribed - the antibiotic, and cough medicine as needed  Please continue all other medications as before, and refills have been done if requested.  Please have the pharmacy call with any other refills you may need.  Please keep your appointments with your specialists as you may have planned  Please make an Appointment to return in 3 months, or sooner if needed

## 2023-04-07 NOTE — Assessment & Plan Note (Signed)
Lab Results  Component Value Date   HGBA1C 6.1 03/23/2022   Stable, pt to continue current medical treatment  - diet, wt control

## 2023-04-07 NOTE — Assessment & Plan Note (Signed)
Mild to mod, for antibx course cephalexin 500 tid, cough med prn,  to f/u any worsening symptoms or concerns

## 2023-04-24 ENCOUNTER — Other Ambulatory Visit: Payer: Self-pay | Admitting: Internal Medicine

## 2023-04-24 DIAGNOSIS — Z1231 Encounter for screening mammogram for malignant neoplasm of breast: Secondary | ICD-10-CM

## 2023-04-27 ENCOUNTER — Ambulatory Visit: Payer: Medicare Other

## 2023-04-27 DIAGNOSIS — Z23 Encounter for immunization: Secondary | ICD-10-CM | POA: Diagnosis not present

## 2023-05-08 ENCOUNTER — Ambulatory Visit: Payer: Medicare Other

## 2023-05-08 DIAGNOSIS — R55 Syncope and collapse: Secondary | ICD-10-CM | POA: Diagnosis not present

## 2023-05-09 LAB — CUP PACEART REMOTE DEVICE CHECK
Battery Voltage: 50
Date Time Interrogation Session: 20241126091639
Implantable Lead Connection Status: 753985
Implantable Lead Connection Status: 753985
Implantable Lead Implant Date: 20170921
Implantable Lead Implant Date: 20170921
Implantable Lead Location: 753859
Implantable Lead Location: 753860
Implantable Lead Model: 377
Implantable Lead Model: 377
Implantable Lead Serial Number: 49575519
Implantable Lead Serial Number: 49580836
Implantable Pulse Generator Implant Date: 20170921
Pulse Gen Model: 407145
Pulse Gen Serial Number: 68850891

## 2023-05-18 ENCOUNTER — Ambulatory Visit
Admission: RE | Admit: 2023-05-18 | Discharge: 2023-05-18 | Disposition: A | Discharge status: Home / Self Care | Payer: Medicare Other | Source: Ambulatory Visit | Attending: Internal Medicine | Admitting: Internal Medicine

## 2023-05-18 DIAGNOSIS — Z1231 Encounter for screening mammogram for malignant neoplasm of breast: Secondary | ICD-10-CM

## 2023-06-01 NOTE — Addendum Note (Signed)
Addended by: Geralyn Flash D on: 06/01/2023 09:41 AM   Modules accepted: Orders

## 2023-06-01 NOTE — Progress Notes (Signed)
Remote pacemaker transmission.   

## 2023-06-13 ENCOUNTER — Ambulatory Visit: Payer: Self-pay | Admitting: Internal Medicine

## 2023-06-13 NOTE — Telephone Encounter (Signed)
 Copied from CRM [id number]. Topic: Clinical - Red Word Triage >> Jun 13, 2023 12:33 PM Renee [name] wrote: Red Word that prompted transfer to Nurse Triage: [phone] Patient states she been sick since Christmas Eve, h/o heart issues, no energy, and short of breath. Patient wants to be seen in the office today. There's no available appointment, confirmed with [facility]. Offered patient to nurse triage because of symptoms, pt declined and hung up.

## 2023-06-13 NOTE — Telephone Encounter (Signed)
 1st attempt made to call pt back to further assess symptoms. Voicemail box is full, unable to LVM. Per management, okay to call alternate number and reach daughter.  Called and spoke with daughter, daughter stating she will go check on her at home, daughter reporting that she regularly gets like this, daughter dropped her off soup yesterday and she wasn't deathly sick, she says she's deathly sick, but pt has been walking and eating, she's doing good. Daughter reporting that she's probably going to head back her direction in the next hour. Daughter reporting that pt is already taking antibiotics that she had at home leftover, and that pt may be trying to come in and get meds. Daughter suggesting to try calling pt again, may have her ringer off, and daughter will lay eyes on her within the hour. Daughter reporting that she will ensure that she's doing okay and/or that pt was able to talk with nurse. Nurse will call back pt in 10 min.  Attempted call to pt again per daughter suggestion, no answer, voicemail box is full.

## 2023-06-15 NOTE — Telephone Encounter (Signed)
 Pt has appt scheduled in office tomorrow with a provider.

## 2023-06-16 ENCOUNTER — Encounter: Payer: Self-pay | Admitting: Internal Medicine

## 2023-06-16 ENCOUNTER — Ambulatory Visit (INDEPENDENT_AMBULATORY_CARE_PROVIDER_SITE_OTHER): Payer: Medicare Other | Admitting: Internal Medicine

## 2023-06-16 ENCOUNTER — Ambulatory Visit (INDEPENDENT_AMBULATORY_CARE_PROVIDER_SITE_OTHER): Payer: Medicare Other

## 2023-06-16 ENCOUNTER — Encounter (HOSPITAL_COMMUNITY): Payer: Self-pay

## 2023-06-16 VITALS — BP 118/80 | HR 112 | Temp 98.1°F | Ht 63.0 in | Wt 168.0 lb

## 2023-06-16 DIAGNOSIS — R051 Acute cough: Secondary | ICD-10-CM | POA: Diagnosis not present

## 2023-06-16 DIAGNOSIS — R059 Cough, unspecified: Secondary | ICD-10-CM | POA: Diagnosis not present

## 2023-06-16 MED ORDER — HYDROCODONE BIT-HOMATROP MBR 5-1.5 MG/5ML PO SOLN: 5.0000 mL | Freq: Three times a day (TID) | ORAL | 0 refills | Status: DC | PRN

## 2023-06-16 MED ORDER — CEFDINIR 300 MG PO CAPS: 300.0000 mg | ORAL_CAPSULE | Freq: Two times a day (BID) | ORAL | 0 refills | Status: DC

## 2023-06-16 MED ORDER — METHYLPREDNISOLONE ACETATE 40 MG/ML IJ SUSP
40.0000 mg | Freq: Once | INTRAMUSCULAR | Status: AC
Start: 2023-06-16 — End: 2023-06-16
  Administered 2023-06-16: 40 mg via INTRAMUSCULAR

## 2023-06-16 MED ORDER — DILTIAZEM HCL ER COATED BEADS 360 MG PO CP24: 360.0000 mg | ORAL_CAPSULE | Freq: Every morning | ORAL | 0 refills | Status: DC

## 2023-06-16 NOTE — Progress Notes (Signed)
   Subjective:   Patient ID: Renee [name], female    DOB: 1946/02/28, 78 y.o.   MRN: [id number]  HPI The patient is a 78 YO female coming in for sick visit. Started about 10 days ago. Is feeling seriously ill and SOB on exertion. Took leftover keflex  500 mg TID and this is not helping after 2-3 days.  Review of Systems  Constitutional:  Positive for activity change and appetite change. Negative for chills, fatigue, fever and unexpected weight change.  HENT:  Positive for congestion, postnasal drip, rhinorrhea, sinus pressure and sinus pain. Negative for ear discharge, ear pain, sneezing, sore throat, tinnitus, trouble swallowing and voice change.   Eyes: Negative.   Respiratory:  Positive for cough and shortness of breath. Negative for chest tightness and wheezing.   Cardiovascular: Negative.   Gastrointestinal: Negative.   Musculoskeletal:  Positive for myalgias.  Neurological: Negative.     Objective:  Physical Exam Constitutional:      Appearance: She is well-developed.  HENT:     Head: Normocephalic and atraumatic.     Comments: Oropharynx with redness and clear drainage, nose with swollen turbinates, TMs normal bilaterally.  Neck:     Thyroid : No thyromegaly.  Cardiovascular:     Rate and Rhythm: Normal rate and regular rhythm.  Pulmonary:     Effort: Pulmonary effort is normal. No respiratory distress.     Breath sounds: Normal breath sounds. No wheezing or rales.  Abdominal:     Palpations: Abdomen is soft.  Musculoskeletal:        General: Tenderness present.     Cervical back: Normal range of motion.  Lymphadenopathy:     Cervical: No cervical adenopathy.  Skin:    General: Skin is warm and dry.  Neurological:     Mental Status: She is alert and oriented to person, place, and time.     Vitals:   06/16/23 1234  BP: 118/80  Pulse: (!) 112  Temp: 98.1 F (36.7 C)  TempSrc: Oral  SpO2: 96%  Weight: 168 lb (76.2 kg)  Height: 5' 3 (1.6 m)   Visit time 25  minutes in face to face communication with patient and coordination of care, additional 5 minutes spent in record review, coordination or care, ordering tests, communicating/referring to other healthcare professionals, documenting in medical records all on the same day of the visit for total time 30 minutes spent on the visit.   Assessment & Plan:  Depo-medrol  40 mg IM given at visit

## 2023-06-16 NOTE — Telephone Encounter (Signed)
 Patient called in stating that she did not receive her shipment of Diltiazem  360 she is completely out- advised patient that prescription is active until Aug of this year- patient asked me to send prescription details so she could review- this has been sent. Advised patient to call pharmacy to see what is going on with it. Patient also requested 30 day supply refill to be sent to [facility] pharmacy in [facility]- this has been sent.

## 2023-06-16 NOTE — Patient Instructions (Signed)
 We have sent in cefdinir to take 1 pill twice a day for 1 week.  We will check the chest x-ray today.  We have given you a steroid shot to help you improve.

## 2023-06-16 NOTE — Assessment & Plan Note (Addendum)
 Given depo-medrol 40 mg IM at visit and rx omnicef and checking cxr. Rx hycodan cough syrup

## 2023-06-19 ENCOUNTER — Telehealth: Payer: Self-pay

## 2023-06-19 MED ORDER — PROMETHAZINE-DM 6.25-15 MG/5ML PO SYRP: 5.0000 mL | ORAL_SOLUTION | Freq: Four times a day (QID) | ORAL | 0 refills | Status: DC | PRN

## 2023-06-19 NOTE — Addendum Note (Signed)
 Addended by: Corwin Levins on: 06/19/2023 01:11 PM   Modules accepted: Orders

## 2023-06-19 NOTE — Telephone Encounter (Signed)
 Pt saw Dr. Rollene 06/16/23  Copied from CRM [id number]. Topic: Clinical - Prescription Issue >> Jun 16, 2023  4:42 PM Montie POUR wrote: Reason for CRM: [name] called and [facility] at [facility] does not  have medication HYDROcodone  bit-homatropine HYDROcodone  bit-homatropine (HYCODAN) 5-1.5 MG/5ML syrup; She needs medication to be called into or sent to [facility]; [facility] # is [phone]

## 2023-06-19 NOTE — Telephone Encounter (Signed)
 Ok for change to prom - dm prn

## 2023-06-21 ENCOUNTER — Encounter: Payer: Self-pay | Admitting: Emergency Medicine

## 2023-06-21 ENCOUNTER — Ambulatory Visit: Payer: Self-pay | Admitting: Internal Medicine

## 2023-06-21 ENCOUNTER — Ambulatory Visit: Payer: Medicare Other | Admitting: Emergency Medicine

## 2023-06-21 VITALS — BP 128/78 | HR 69 | Temp 98.4°F | Ht 63.0 in | Wt 167.0 lb

## 2023-06-21 DIAGNOSIS — R053 Chronic cough: Secondary | ICD-10-CM | POA: Diagnosis not present

## 2023-06-21 DIAGNOSIS — J069 Acute upper respiratory infection, unspecified: Secondary | ICD-10-CM | POA: Diagnosis not present

## 2023-06-21 MED ORDER — BENZONATATE 200 MG PO CAPS
200.0000 mg | ORAL_CAPSULE | Freq: Two times a day (BID) | ORAL | 0 refills | Status: DC | PRN
Start: 2023-06-21 — End: 2023-09-04

## 2023-06-21 NOTE — Telephone Encounter (Signed)
  Chief Complaint: productive cough Symptoms: productive cough, chest congestion, runny nose, fatigue, SOB Frequency: began 06/13/23 Pertinent Negatives: Patient denies blood in sputum, fever Disposition: [] ED /[] Urgent Care (no appt availability in office) / [x] Appointment(In office/virtual)/ []  [facility]/ [] Home Care/ [] Refused Recommended Disposition /[] [facility]/ []  Follow-up with PCP Additional Notes: Patient calls stating she was seen in office last week and is not improving. Pt reports fatigue, productive cough, SOB with activity, chest congestion, runny nose- states she has 3 days left on antibiotics and should be better by this point. Pt denies blood in sputum, or increased work of breathing from last visit. Per protocol, pt to be evaluated within 4 hours. Pt declines UC, requests any provider in clinic for today. Scheduled for 1340 with alternate provider. Care advice reviewed, patient verbalized understanding, alerting PCP for review.   Copied from CRM [id number]. Topic: Clinical - Red Word Triage >> Jun 21, 2023  8:50 AM Leila BROCKS wrote: Red Word that prompted transfer to Nurse Triage: Patient [phone] c/o light headness spells, hot and cold spells, feverish but no fever, short of breath, and is not feeling any better. Patient was seen last week on Friday. Patient has a h/o congestive heart failure. Reason for Disposition  [1] MILD difficulty breathing (e.g., minimal/no SOB at rest, SOB with walking, pulse <100) AND [2] still present when not coughing  Answer Assessment - Initial Assessment Questions 1. ONSET: When did the cough begin?      06/13/23 2. SEVERITY: How bad is the cough today?      Reports she coughs nonstop 3. SPUTUM: Describe the color of your sputum (none, dry cough; clear, white, yellow, green)     Yellow, very thick 4. HEMOPTYSIS: Are you coughing up any blood? If so ask: How much? (flecks, streaks, tablespoons, etc.)      Denies 5. DIFFICULTY BREATHING: Are you having difficulty breathing? If Yes, ask: How bad is it? (e.g., mild, moderate, severe)    - MILD: No SOB at rest, mild SOB with walking, speaks normally in sentences, can lie down, no retractions, pulse < 100.    - MODERATE: SOB at rest, SOB with minimal exertion and prefers to sit, cannot lie down flat, speaks in phrases, mild retractions, audible wheezing, pulse 100-120.    - SEVERE: Very SOB at rest, speaks in single words, struggling to breathe, sitting hunched forward, retractions, pulse > 120      Moderate 6. FEVER: Do you have a fever? If Yes, ask: What is your temperature, how was it measured, and when did it start?     Last 3-4 days, reports she feels flushed but temperature is normal 7. CARDIAC HISTORY: Do you have any history of heart disease? (e.g., heart attack, congestive heart failure)      Pacemaker, CHF 8. LUNG HISTORY: Do you have any history of lung disease?  (e.g., pulmonary embolus, asthma, emphysema)     Denies 9. PE RISK FACTORS: Do you have a history of blood clots? (or: recent major surgery, recent prolonged travel, bedridden)     Denies 10. OTHER SYMPTOMS: Do you have any other symptoms? (e.g., runny nose, wheezing, chest pain)       Runny nose  Protocols used: Cough - Acute Productive-A-AH
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
## 2023-06-21 NOTE — Patient Instructions (Signed)
 Start daily Airborne or emergen-C for the next couple weeks. Continue and finish cefdinir  Continue over-the-counter cough medication Start Tessalon  200 mg 3 times a day Continue bedtime cough syrup Stay home from work, rest, stay well-hydrated.  Upper Respiratory Infection, Adult An upper respiratory infection (URI) affects the nose, throat, and upper airways that lead to the lungs. The most common type of URI is often called the common cold. URIs usually get better on their own, without medical treatment. What are the causes? A URI is caused by a germ (virus). You may catch these germs by: Breathing in droplets from an infected person's cough or sneeze. Touching something that has the germ on it (is contaminated) and then touching your mouth, nose, or eyes. What increases the risk? You are more likely to get a URI if: You are very young or very old. You have close contact with others, such as at work, school, or a health care facility. You smoke. You have long-term (chronic) heart or lung disease. You have a weakened disease-fighting system (immune system). You have nasal allergies or asthma. You have a lot of stress. You have poor nutrition. What are the signs or symptoms? Runny or stuffy (congested) nose. Cough. Sneezing. Sore throat. Headache. Feeling tired (fatigue). Fever. Not wanting to eat as much as usual. Pain in your forehead, behind your eyes, and over your cheekbones (sinus pain). Muscle aches. Redness or irritation of the eyes. Pressure in the ears or face. How is this treated? URIs usually get better on their own within 7-10 days. Medicines cannot cure URIs, but your doctor may recommend certain medicines to help relieve symptoms, such as: Over-the-counter cold medicines. Medicines to reduce coughing (cough suppressants). Coughing is a type of defense against infection that helps to clear the nose, throat, windpipe, and lungs (respiratory system). Take these  medicines only as told by your doctor. Medicines to lower your fever. Follow these instructions at home: Activity Rest as needed. If you have a fever, stay home from work or school until your fever is gone, or until your doctor says you may return to work or school. You should stay home until you cannot spread the infection anymore (you are not contagious). Your doctor may have you wear a face mask so you have less risk of spreading the infection. Relieving symptoms Rinse your mouth often with salt water . To make salt water , dissolve -1 tsp (3-6 g) of salt in 1 cup (237 mL) of warm water . Use a cool-mist humidifier to add moisture to the air. This can help you breathe more easily. Eating and drinking  Drink enough fluid to keep your pee (urine) pale yellow. Eat soups and other clear broths. General instructions  Take over-the-counter and prescription medicines only as told by your doctor. Do not smoke or use any products that contain nicotine or tobacco. If you need help quitting, ask your doctor. Avoid being where people are smoking (avoid secondhand smoke). Stay up to date on all your shots (immunizations), and get the flu shot every year. Keep all follow-up visits. How to prevent the spread of infection to others  Wash your hands with soap and water  for at least 20 seconds. If you cannot use soap and water , use hand sanitizer. Avoid touching your mouth, face, eyes, or nose. Cough or sneeze into a tissue or your sleeve or elbow. Do not cough or sneeze into your hand or into the air. Contact a doctor if: You are getting worse, not better. You have  any of these: A fever or chills. Brown or red mucus in your nose. Yellow or brown fluid (discharge)coming from your nose. Pain in your face, especially when you bend forward. Swollen neck glands. Pain when you swallow. White areas in the back of your throat. Get help right away if: You have shortness of breath that gets worse. You  have very bad or constant: Headache. Ear pain. Pain in your forehead, behind your eyes, and over your cheekbones (sinus pain). Chest pain. You have long-lasting (chronic) lung disease along with any of these: Making high-pitched whistling sounds when you breathe, most often when you breathe out (wheezing). Long-lasting cough (more than 14 days). Coughing up blood. A change in your usual mucus. You have a stiff neck. You have changes in your: Vision. Hearing. Thinking. Mood. These symptoms may be an emergency. Get help right away. Call 911. Do not wait to see if the symptoms will go away. Do not drive yourself to the hospital. Summary An upper respiratory infection (URI) is caused by a germ (virus). The most common type of URI is often called the common cold. URIs usually get better within 7-10 days. Take over-the-counter and prescription medicines only as told by your doctor. This information is not intended to replace advice given to you by your health care provider. Make sure you discuss any questions you have with your health care provider. Document Revised: 12/30/2020 Document Reviewed: 12/30/2020 Elsevier Patient Education  2024 [employer].

## 2023-06-21 NOTE — Assessment & Plan Note (Signed)
 Clinically stable.  Running its course without complications.  No red flag signs or symptoms. Advised to rest and stay well-hydrated Continue and finish cefdinir antibiotic Symptom management discussed Work note provided as requested

## 2023-06-21 NOTE — Assessment & Plan Note (Signed)
 Cough management discussed Advised to continue over-the-counter Mucinex DM and cough drops Recommend to start Tessalon 200 mg 3 times a day and continue Hycodan syrup at bedtime

## 2023-07-21 ENCOUNTER — Other Ambulatory Visit (HOSPITAL_COMMUNITY): Payer: Self-pay | Admitting: Cardiology

## 2023-08-07 ENCOUNTER — Ambulatory Visit (INDEPENDENT_AMBULATORY_CARE_PROVIDER_SITE_OTHER): Payer: Medicare Other

## 2023-08-07 DIAGNOSIS — R55 Syncope and collapse: Secondary | ICD-10-CM | POA: Diagnosis not present

## 2023-08-07 DIAGNOSIS — I509 Heart failure, unspecified: Secondary | ICD-10-CM

## 2023-08-09 LAB — CUP PACEART REMOTE DEVICE CHECK
Date Time Interrogation Session: 20250225141616
Implantable Lead Connection Status: 753985
Implantable Lead Connection Status: 753985
Implantable Lead Implant Date: 20170921
Implantable Lead Implant Date: 20170921
Implantable Lead Location: 753859
Implantable Lead Location: 753860
Implantable Lead Model: 377
Implantable Lead Model: 377
Implantable Lead Serial Number: 49575519
Implantable Lead Serial Number: 49580836
Implantable Pulse Generator Implant Date: 20170921
Pulse Gen Model: 407145
Pulse Gen Serial Number: 68850891

## 2023-08-13 ENCOUNTER — Encounter: Payer: Self-pay | Admitting: Internal Medicine

## 2023-08-25 DIAGNOSIS — H04123 Dry eye syndrome of bilateral lacrimal glands: Secondary | ICD-10-CM | POA: Diagnosis not present

## 2023-09-04 ENCOUNTER — Ambulatory Visit: Payer: Medicare Other | Attending: Internal Medicine | Admitting: Internal Medicine

## 2023-09-04 ENCOUNTER — Encounter: Payer: Self-pay | Admitting: Internal Medicine

## 2023-09-04 VITALS — BP 112/64 | HR 72 | Ht 63.0 in | Wt 171.0 lb

## 2023-09-04 DIAGNOSIS — I824Y2 Acute embolism and thrombosis of unspecified deep veins of left proximal lower extremity: Secondary | ICD-10-CM

## 2023-09-04 DIAGNOSIS — I471 Supraventricular tachycardia, unspecified: Secondary | ICD-10-CM

## 2023-09-04 LAB — CUP PACEART INCLINIC DEVICE CHECK
Battery Remaining Percentage: 50 %
Brady Statistic RA Percent Paced: 42 %
Date Time Interrogation Session: 20250324153614
Implantable Lead Connection Status: 753985
Implantable Lead Connection Status: 753985
Implantable Lead Implant Date: 20170921
Implantable Lead Implant Date: 20170921
Implantable Lead Location: 753859
Implantable Lead Location: 753860
Implantable Lead Model: 377
Implantable Lead Model: 377
Implantable Lead Serial Number: 49575519
Implantable Lead Serial Number: 49580836
Implantable Pulse Generator Implant Date: 20170921
Lead Channel Impedance Value: 390 Ohm
Lead Channel Impedance Value: 526 Ohm
Lead Channel Setting Pacing Amplitude: 2 V
Pulse Gen Model: 407145
Pulse Gen Serial Number: 68850891
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
## 2023-09-04 NOTE — Patient Instructions (Addendum)
 Medication Instructions:  Your physician recommends that you continue on your current medications as directed. Please refer to the Current Medication list given to you today.  *If you need a refill on your cardiac medications before your next appointment, please call your pharmacy*  Lab Work: None ordered.  You may go to any [facility] Location for your lab work:  [facility] - [address] ([facility]) - [address] [phone] [address]  [place] - [address]   [place]  - [address]   [place] - [address] - [address] [place] [place]  - [address] [place] - [address] ([facility]   If you have labs (blood work) drawn today and your tests are completely normal, you will receive your results only by: [employer] (if you have MyChart)  If you have any lab test that is abnormal or we need to change your treatment, we will call you or send a MyChart message to review the results.  Testing/Procedures: Left leg ultrasound   Follow-Up: At [facility], you and your health needs are our priority.  As part of our continuing mission to provide you with exceptional heart care, we have created designated Provider Care Teams.  These Care Teams include your primary Cardiologist (physician) and Advanced Practice Providers (APPs -  Physician Assistants and Nurse Practitioners) who all work together to provide you with the care you need, when you need it.  Your next appointment:   To be determined  The format for your next appointment:   In Person  Provider:   Lewayne Bunting, MD{or one of the following Advanced Practice Providers on your designated Care Team:   Francis Dowse, [place] Casimiro Needle "Mardelle Matte" [place], [place] Earnest Rosier, NP  Note: Remote monitoring is used to monitor your Pacemaker/ ICD from home. This monitoring reduces the number of office visits required  to check your device to one time per year. It allows [place] to keep an eye on the functioning of your device to ensure it is working properly.            Valet parking services will be available as well.

## 2023-09-06 ENCOUNTER — Ambulatory Visit (HOSPITAL_COMMUNITY)
Admission: RE | Admit: 2023-09-06 | Discharge: 2023-09-06 | Disposition: A | Discharge status: Home / Self Care | Source: Ambulatory Visit | Attending: Cardiology | Admitting: Cardiology

## 2023-09-06 DIAGNOSIS — I824Y2 Acute embolism and thrombosis of unspecified deep veins of left proximal lower extremity: Secondary | ICD-10-CM | POA: Insufficient documentation

## 2023-09-10 ENCOUNTER — Other Ambulatory Visit (HOSPITAL_COMMUNITY): Payer: Self-pay | Admitting: Cardiology

## 2023-09-11 NOTE — Addendum Note (Signed)
 Addended by: Geralyn Flash D on: 09/11/2023 04:37 PM   Modules accepted: Orders

## 2023-09-11 NOTE — Progress Notes (Signed)
 Remote pacemaker transmission.

## 2023-10-06 DIAGNOSIS — M7062 Trochanteric bursitis, left hip: Secondary | ICD-10-CM | POA: Diagnosis not present

## 2023-10-06 DIAGNOSIS — M7061 Trochanteric bursitis, right hip: Secondary | ICD-10-CM | POA: Diagnosis not present

## 2023-10-06 DIAGNOSIS — S2020XA Contusion of thorax, unspecified, initial encounter: Secondary | ICD-10-CM | POA: Diagnosis not present

## 2023-10-06 DIAGNOSIS — W19XXXA Unspecified fall, initial encounter: Secondary | ICD-10-CM | POA: Diagnosis not present

## 2023-10-23 ENCOUNTER — Telehealth (HOSPITAL_COMMUNITY): Payer: Self-pay | Admitting: Cardiology

## 2023-10-23 MED ORDER — FUROSEMIDE 20 MG PO TABS: 20.0000 mg | ORAL_TABLET | Freq: Every day | ORAL | 3 refills | Status: AC | PRN

## 2023-10-23 MED ORDER — FUROSEMIDE 20 MG PO TABS: 20.0000 mg | ORAL_TABLET | Freq: Every day | ORAL | 0 refills | Status: DC | PRN

## 2023-10-23 NOTE — Telephone Encounter (Signed)
 Pt called to request RX to local pharmacy and refill for mail order  Rx sent

## 2023-10-23 NOTE — Progress Notes (Unsigned)
 [facility] Return Visit   Referring Provider Roslyn Coombe, MD [address] [place],  [place] [address]  Primary Care Provider Roslyn Coombe, MD  Patient Profile: Renee [name] is a 78 y.o. female with a past medical history noteworthy for HTN, coronary artery disease, nonsustained V. tach, SVT, long QT syndrome, thyroid  nodules who returns to the [facility] for follow-up of the problem(s) noted below.  Problem List: Alternating diarrhea and constipation with intermittent lower abdominal pain Intermittent fecal incontinence with decreased sphincter tone Suspected hemorrhoidal symptoms including bleeding Personal history of adenomatous colon polyps Diverticulosis GERD   History of Present Illness   Renee [name] was last seen in the GI office 11/15/2021 by Dr. Sandrea Cruel   Current GI Meds  Omeprazole  40 mg p.o. twice daily Dicyclomine  10 mg p.o. 3 times daily as needed for abdominal pain MiraLAX as needed for constipation   Interval History   Diarrhea/Constipation/Abdominal pain -- Reports ongoing issues with fluctuating bowel habits significantly impacting quality of life -- States that she can go 3 to 4 weeks without a bowel movement -- When she defecates will have large, watery stools with diarrhea or sometimes just small balls of stool -- States that there has been a change in her stool caliber over the last year and her stools are thinner at times -- Sometimes has diarrhea immediately after eating -- Describes pain wrapping around her upper abdomen and left flank that seem to worsen after recent fall -- Endorses episodes of fecal incontinence -- No significant rectal bleeding  -- Has also had episodes of urinary incontinence -longstanding issue -- Records indicate she underwent cystocele and rectocele repair in 2019 -- Dr. Emaline Handsome notes mention recommendations for Kegel exercises but has not formally seen pelvic floor physical  therapist  -- There was discussion regarding scheduling a colonoscopy in 2023 but this was deferred due to cardiac issues -reports history of long QT and pacemaker placement-now stable -- Last colonoscopy as documented below  GERD -- Manages symptoms of GERD with omeprazole  40 mg p.o. twice daily -- Has intermittent breakthrough symptoms that could be dietary related -- Denies significant regurgitation, pyrosis, water  brash, dysphagia or dyne aphasia -- No vomiting -- Concerned that abdominal pain in the upper abdomen could be related to upper GI tract -- No history of H. pylori or peptic ulcer disease -- Last EGD as documented below   Last colonoscopy: 08/2018 - IH, EH, cecal polyp (TA), left colon diverticulosis Last endoscopy: 10/2019 - variable Z-line, small HH, mild erythema in prepyloric region  Last Abd CT/CTE/MRE: 08/2022 - Normal  GI Review of Symptoms Significant for constipation, diarrhea. Otherwise negative.  General Review of Systems  Review of systems is significant for the pertinent positives and negatives as listed per the HPI.  Full ROS is otherwise negative.  Past Medical History   Past Medical History:  Diagnosis Date   Allergy     tape, electrodes   Anxiety    AV nodal re-entry tachycardia [facility])    a. s/p RFA 1993- Dr. Clotilde Danker, [facility]   Broken neck [facility])    1993   Cervical cancer [facility])    hx of cervical   ca   CHF (congestive heart failure) (HCC)    Chronic chest pain    a. 2005 Cath: Mild nonobstructive plaque (30-40% LCX);  b. 08/2011 low risk myoview ;  c. 09/2011 Coronary CT: NL cors w ca score of 0.   Chronic pain syndrome  Colon polyps    Complication of anesthesia    blood pressure and heart dropped in 2009     Concussion    1979, 1993   Degenerative joint disease    a. s/p L Total Knee Arthroplasty.   Diet-controlled type 2 diabetes mellitus (HCC)    pt. denies   Diverticulosis of colon    Duodenitis without hemorrhage    Dysrhythmia     REPORTS , SINCE PLACEMENT OF PACEMAKER, STILL CAN FEEL HEART " GOING IN AND OUT OF RYTHYN" REPORTS, YESTERDAY HER BP SYSTOLIC WAS IN THE 200s AND WAS BREATHLESS, HR 112, SWEATING ; DENIES SYNCOPE  , DENIES CHEST PAIN    Fibromyalgia    GERD (gastroesophageal reflux disease)    Hemorrhoids    Hypertension    Irritable bowel syndrome    Long QT interval    a. mild - advised to avoid meds that may prolong QT.   Myocardial infarction [facility]) 09/1991   does not know when second heart attack was   Neuropathy    Osteoporosis    Paroxysmal A-fib (HCC)    PER PATIENT. I CAN FEEL MY HEART RACING WHEN IM IN IT    Recurrent upper respiratory infection (URI)    SVT (supraventricular tachycardia) (HCC)    a. nonsustained SVT - previously offered flecainide  but refused;  b. 12/2012 30 day event monitor w/o significant arrhythmias.   Thyroid  nodule      Past Surgical History   Past Surgical History:  Procedure Laterality Date   ABLATION OF DYSRHYTHMIC FOCUS  1993   ANTERIOR AND POSTERIOR REPAIR N/A 08/01/2017   Procedure: ANTERIOR (CYSTOCELE) AND POSTERIOR REPAIR (RECTOCELE);  Surgeon: Erman Hayward, MD;  Location: [facility];  Service: Urology;  Laterality: N/A;   APPENDECTOMY     1980   BILATERAL SALPINGECTOMY Left 08/01/2017   Procedure: SALPINGECTOMY;  Surgeon: Matt Song, MD;  Location: [facility];  Service: Gynecology;  Laterality: Left;   BIOPSY THYROID      CARDIAC ELECTROPHYSIOLOGY STUDY AND ABLATION     atrioventricular nodal reentant tachycardia   CHOLECYSTECTOMY  1980   COLONOSCOPY  06/25/2008   Diverticulosis and Hemorrhoids   COLPOSCOPY     CORONARY ANGIOPLASTY  2007   CYSTOSCOPY N/A 08/01/2017   Procedure: CYSTOSCOPY;  Surgeon: Erman Hayward, MD;  Location: [facility];  Service: Urology;  Laterality: N/A;   EP IMPLANTABLE DEVICE N/A 03/03/2016   Procedure: Pacemaker Implant;  Surgeon: Tammie Fall, MD;  Location: [facility] INVASIVE CV LAB;  Service: Cardiovascular;  Laterality: N/A;    ESOPHAGOGASTRODUODENOSCOPY     HYSTEROSCOPY  2002   with resection of endometrial polyps. by Dr.MCPhail   HYSTEROSCOPY WITH D & C N/A 02/05/2015   Procedure: DILATATION AND CURETTAGE /HYSTEROSCOPY;  Surgeon: Reggy Capers, MD;  Location: [facility];  Service: Gynecology;  Laterality: N/A;   LOOP RECORDER IMPLANT  08/21/2013   MDT LinQ implanted by Dr Carolynne Citron for syncope   LOOP RECORDER IMPLANT N/A 08/21/2013   Procedure: LOOP RECORDER IMPLANT;  Surgeon: Tammie Fall, MD;  Location: [facility] CATH LAB;  Service: Cardiovascular;  Laterality: N/A;   ORIF RADIAL HEAD / NECK FRACTURE  09/05/88   TILT TABLE STUDY N/A 05/15/2013   Procedure: TILT TABLE STUDY;  Surgeon: Verona Goodwill, MD;  Location: [facility] CATH LAB;  Service: Cardiovascular;  Laterality: N/A;   TOTAL HIP ARTHROPLASTY  10/10   Left, at [facility]   UPPER GASTROINTESTINAL ENDOSCOPY     VAGINAL HYSTERECTOMY N/A 08/01/2017  Procedure: HYSTERECTOMY VAGINAL;  Surgeon: Matt Song, MD;  Location: [facility];  Service: Gynecology;  Laterality: N/A;     Allergies and Medications   Allergies  Allergen Reactions   Cyclobenzaprine Other (See Comments)    Pt states that this medication does not work for her.     Azithromycin  Other (See Comments)    Cannot take due to prolonged QT   Nitrofurantoin Other (See Comments)   Adhesive [Tape] Itching, Rash and Other (See Comments)    Reaction:  Blisters  Pt states that she is allergic to the adhesive backing of EKG pads if left on for more than 24 hours Patient states she tolerates paper tape ok   Moxifloxacin Other (See Comments)    Unknown allergic reaction many years ago   Quinolones Other (See Comments)    Unknown allergic reaction many years ago   Current Meds  Medication Sig   ACCU-CHEK FASTCLIX LANCETS MISC USE UP TO 4 TIMES DAILY AS  DIRECTED   ACCU-CHEK GUIDE test strip USE UP TO 4 TIMES DAILY AS  DIRECTED   ALPRAZolam  (XANAX ) 0.5 MG tablet Take 0.5-1 tablets (0.25-0.5 mg total) by mouth  daily as needed for anxiety.   aspirin  EC 81 MG tablet Take 81 mg by mouth daily.   blood glucose meter kit and supplies KIT Dispense per patient and insurance preferred. Use up to four times daily as directed. E11.9   cetirizine (ZYRTEC) 10 MG tablet Take 10 mg by mouth daily as needed.   diltiazem  (CARDIZEM  CD) 360 MG 24 hr capsule Take 1 capsule (360 mg total) by mouth every morning.   diltiazem  (CARDIZEM ) 30 MG tablet Take 2 tablets (60 mg total) by mouth 4 (four) times daily as needed (as needed for breakthrough heart racing/palpitations).   flecainide  (TAMBOCOR ) 50 MG tablet TAKE 1 TABLET BY MOUTH TWICE  DAILY   furosemide  (LASIX ) 20 MG tablet Take 1 tablet (20 mg total) by mouth daily as needed for fluid.   losartan  (COZAAR ) 50 MG tablet TAKE 1 TABLET BY MOUTH TWICE  DAILY   nitroGLYCERIN  (NITROSTAT ) 0.4 MG SL tablet DISSOLVE 1 TABLET UNDER THE  TONGUE EVERY 5 MINUTES AS NEEDED FOR CHEST PAIN. MAX OF 3 TABLETS IN 15 MINUTES. CALL 911 IF PAIN  PERSISTS.   omeprazole  (PRILOSEC) 40 MG capsule TAKE 1 CAPSULE BY MOUTH IN  THE MORNING AND AT BEDTIME   promethazine -dextromethorphan  (PROMETHAZINE -DM) 6.25-15 MG/5ML syrup Take 5 mLs by mouth 4 (four) times daily as needed.   trimethoprim  (TRIMPEX ) 100 MG tablet Take 100 mg by mouth daily as needed (For UTI per patient).   zolpidem  (AMBIEN ) 5 MG tablet TAKE 1 TABLET BY MOUTH AT  BEDTIME AS NEEDED FOR SLEEP     Family His   Family History  Problem Relation Age of Onset   Ovarian cancer Mother    Stroke Mother    Diabetes Mother    Asthma Mother    Lung cancer Father        Heart problems   Heart disease Sister    Diabetes Brother    Diabetes Sister        Pacemaker, CHF   Heart disease Sister    Asthma Sister    Colon cancer Neg Hx    Esophageal cancer Neg Hx    Stomach cancer Neg Hx    Rectal cancer Neg Hx    Social History   Social History   Tobacco Use   Smoking status: Never   Smokeless  tobacco: Never  Vaping Use   Vaping  status: Never Used  Substance Use Topics   Alcohol use: Not Currently   Drug use: No   Ryeleigh reports that she has never smoked. She has never used smokeless tobacco. She reports that she does not currently use alcohol. She reports that she does not use drugs.  Vital Signs and Physical Examination   Vitals:   10/25/23 0829  BP: 120/70  Pulse: 67   Body mass index is 30.47 kg/m. Weight: 172 lb (78 kg)  General: Well developed, well nourished, no acute distress Head: Normocephalic and atraumatic Eyes: Sclerae anicteric, EOMI Lungs: Clear throughout to auscultation Heart: Regular rate and rhythm; No murmurs, rubs or bruits Abdomen: Soft, tender to palpation over the upper abdomen as well as left flank and non distended. No masses, hepatosplenomegaly or hernias noted. Normal Bowel sounds.  Well-healed right upper quadrant scar from prior cholecystectomy Rectal: Deferred Musculoskeletal: Symmetrical with no gross deformities    Review of Data   The following data was reviewed at the time of this encounter:   Laboratory Studies      Latest Ref Rng & Units 01/13/2023   12:05 PM 11/25/2022   11:56 AM 09/03/2022   11:40 AM  CBC  WBC 4.0 - 10.5 K/uL 6.0  5.4  13.1   Hemoglobin 12.0 - 15.0 g/dL 57.8  46.9  62.9   Hematocrit 36.0 - 46.0 % 40.8  40.3  41.8   Platelets 150 - 400 K/uL 268  258  278     Lab Results  Component Value Date   LIPASE 25.0 12/10/2019      Latest Ref Rng & Units 02/23/2023   12:00 AM 01/13/2023   12:05 PM 11/25/2022   11:56 AM  CMP  Glucose 70 - 99 mg/dL 83  92  95   BUN mg/dL 9  6  9    Creatinine mg/dL 5.28  4.13  2.44   Sodium 134 - 144 mmol/L 139  139  137   Potassium 3.5 - 5.2 mmol/L 4.0  4.0  3.8   Chloride 96 - 106 mmol/L 104  105  103   CO2 20 - 29 mmol/L 27  26  25    Calcium  mg/dL 01.0  9.4  9.5   Total Protein 6.5 - 8.1 g/dL  6.9  6.6   Total Bilirubin 0.3 - 1.2 mg/dL  0.9  1.0   Alkaline Phos 38 - 126 U/L  116  119   AST 15 - 41 U/L   20  20   ALT 0 - 44 U/L  20  23      Imaging Studies  CTAP 09/03/2022 Normal   CTAP 12/12/2019 Normal  CTAP 01/29/2016 Normal  GI Procedures and Studies   EGD 10/16/2019 - Variable Z-line - Mild friability and granularity in prepyloric region - Small HH Path: mild chronic gastritis, intestinal metaplasia focally present in the gastric antrum  Colonoscopy 08/15/2018 - IH, EH - 8 mm cecal polyp (TA) - Mild left colon diverticulosis  Colonoscopy 04/01/2015 - 2 sessile polyps in sigmoid colon (TA) - IH  Colonoscopy 06/25/2008 - Mild diverticulosis - Hemorrhoids  EGD 08/09/2006 - Gastritis - Duodenitis  Colonoscopy 08/10/2005 - Normal  Clinical Impression  It is my clinical impression that Ms. Haughney is a 78 y.o. female with;  Alternating diarrhea and constipation with intermittent lower abdominal pain Intermittent fecal incontinence with decreased sphincter tone Suspected hemorrhoidal symptoms including bleeding Personal history of adenomatous colon  polyps Diverticulosis GERD  Truett Gab presents today for follow-up of a longstanding history of alternating diarrhea, constipation and fecal incontinence.  Stools vacillate from being excessively loose to small hard balls.  She feels that there has been a change in her stool caliber over the last year within her bowel movements.  Also noting worsening abdominal pain over her upper abdomen and left flank since a recent fall.  Records indicate that she underwent cystocele and rectocele repair in 2019.  There has been some suspicion for pelvic floor dysfunction given that she has also had urinary incontinence.  Previously advised to do Kegel exercises but has not formally seen pelvic floor physical therapy.  There were plans for a follow-up colonoscopy in 2023 which were deferred due to cardiac issues-Long QT syndrome and pacemaker.  At today's visit we discussed scheduling an updated colonoscopy as well as cross-sectional imaging of her  abdomen due to her worsening pain and change in bowel habits.  She states her cardiac issues are now more stable.  She is worried about her cardiac status and undergoing anesthesia.  To that end we discussed performing her procedure in a hospital-based setting.     She is also endorsing symptoms of intermittent GERD as well as upper abdominal pain.  Last EGD was performed in 2021 showing a small hiatal hernia and mild chronic gastritis that was H. pylori negative.  There was focal intestinal metaplasia.  Given her increasing abdominal pain it would be appropriate to perform an upper endoscopy at the same time as her colonoscopy.  Biopsies can be performed for H. pylori as well as celiac disease.  Given previous finding of gastric intestinal metaplasia mapping biopsies can be performed for GIM.  Plan  Schedule EGD and colonoscopy for follow-up of GERD, gastric intestinal metaplasia, change in bowel habits.  2-day bowel prep given history of constipation.  This will be scheduled in a hospital-based setting due to her cardiac history -notes prior concern about prolonged QT and need for pacemaker that prompted deferring last colonoscopy 2023.  Cardiac issues are reported to now be stable.  Will request cardiac clearance prior to anesthesia. Schedule CTAP for further evaluation of left-sided abdominal pain subjectively and objectively noted on physical exam If cross-sectional imaging and endoscopic evaluation rule out pathologies contributing to change in bowel habits consider referral for pelvic floor physical therapy for possible pelvic floor dysfunction contributing to symptoms Continue omeprazole  40 mg p.o. twice daily GERD precautions and lifestyle modification for reflux   Planned Follow Up 2-3  months  The patient or caregiver verbalized understanding of the material covered, with no barriers to understanding. All questions were answered. Patient or caregiver is agreeable with the plan outlined  above.    It was a pleasure to see Daivd Dub.  If you have any questions or concerns regarding this evaluation, do not hesitate to contact me.  Eugenia Hess, MD [facility]   I spent total of 40  minutes in both face-to-face (25 minutes interview) and non-face-to-face (15 minutes chart review, coordination of care and documentation) activities, excluding procedures performed, for the visit on the date of this encounter.

## 2023-10-25 ENCOUNTER — Ambulatory Visit: Admitting: Pediatrics

## 2023-10-25 ENCOUNTER — Encounter: Payer: Self-pay | Admitting: Pediatrics

## 2023-10-25 ENCOUNTER — Telehealth: Payer: Self-pay

## 2023-10-25 ENCOUNTER — Other Ambulatory Visit (HOSPITAL_COMMUNITY): Payer: Self-pay

## 2023-10-25 VITALS — BP 120/70 | HR 67 | Ht 63.0 in | Wt 172.0 lb

## 2023-10-25 DIAGNOSIS — R109 Unspecified abdominal pain: Secondary | ICD-10-CM | POA: Diagnosis not present

## 2023-10-25 DIAGNOSIS — K5790 Diverticulosis of intestine, part unspecified, without perforation or abscess without bleeding: Secondary | ICD-10-CM

## 2023-10-25 DIAGNOSIS — R159 Full incontinence of feces: Secondary | ICD-10-CM

## 2023-10-25 DIAGNOSIS — K5900 Constipation, unspecified: Secondary | ICD-10-CM | POA: Diagnosis not present

## 2023-10-25 DIAGNOSIS — R197 Diarrhea, unspecified: Secondary | ICD-10-CM | POA: Diagnosis not present

## 2023-10-25 DIAGNOSIS — I509 Heart failure, unspecified: Secondary | ICD-10-CM

## 2023-10-25 DIAGNOSIS — K219 Gastro-esophageal reflux disease without esophagitis: Secondary | ICD-10-CM

## 2023-10-25 DIAGNOSIS — K31A0 Gastric intestinal metaplasia, unspecified: Secondary | ICD-10-CM

## 2023-10-25 DIAGNOSIS — R194 Change in bowel habit: Secondary | ICD-10-CM

## 2023-10-25 NOTE — Telephone Encounter (Signed)
 [facility] Pre-operative Risk Assessment     Request for surgical clearance:     Endoscopy Procedure  What type of surgery is being performed?     Endoscopy and Colonoscopy  When is this surgery scheduled?     12/14/23  What type of clearance is required ?   Pharmacy  Are there any medications that need to be held prior to surgery and how long? No meds, Cardiac clearance needed for procedure.  Practice name and name of physician performing surgery?      [facility]  What is your office phone and fax number?      Phone- [phone]  Fax- [phone]  Anesthesia type (None, local, MAC, general) ?       MAC   Please route your response to Caleen Catalan, CMA

## 2023-10-25 NOTE — Telephone Encounter (Signed)
 Tried to call the pt to schedule tele preop appt in June as procedure is 12/14/23. Vm is full could not leave message to call back.

## 2023-10-25 NOTE — Patient Instructions (Signed)
 You have been scheduled for a CT scan of the abdomen and pelvis at [facility], 1st floor Radiology. You are scheduled on Friday, 10/31/23 at 5:30pm. You should arrive 15 minutes prior to your appointment time for registration.  Please follow the written instructions below on the day of your exam:    You may take any medications as prescribed with a small amount of water , if necessary. If you take any of the following medications: METFORMIN, GLUCOPHAGE, GLUCOVANCE, AVANDAMET, RIOMET, FORTAMET, ACTOPLUS MET, JANUMET, GLUMETZA or METAGLIP, you MAY be asked to HOLD this medication 48 hours AFTER the exam.   The purpose of you drinking the oral contrast is to aid in the visualization of your intestinal tract. The contrast solution may cause some diarrhea. Depending on your individual set of symptoms, you may also receive an intravenous injection of x-ray contrast/dye. Plan on being at [facility] for 45 minutes or longer, depending on the type of exam you are having performed.   If you have any questions regarding your exam or if you need to reschedule, you may call [facility] at [phone] between the hours of 8:00 am and 5:00 pm, Monday-Friday.    You have been scheduled for an endoscopy and colonoscopy. Please follow the written instructions given to you at your visit today.  If you use inhalers (even only as needed), please bring them with you on the day of your procedure.  DO NOT TAKE 7 DAYS PRIOR TO TEST- Trulicity (dulaglutide) Ozempic, Wegovy (semaglutide) Mounjaro (tirzepatide) Bydureon Bcise (exanatide extended release)  DO NOT TAKE 1 DAY PRIOR TO YOUR TEST Rybelsus (semaglutide) Adlyxin (lixisenatide) Victoza (liraglutide) Byetta (exanatide) ___________________________________________________________________________  Thank you for entrusting me with your care and for choosing [facility],  Dr. Eugenia Hess   _______________________________________________________  If your blood pressure at your visit was 140/90 or greater, please contact your primary care physician to follow up on this.  _______________________________________________________  If you are age 23 or older, your body mass index should be between 23-30. Your Body mass index is 30.47 kg/m. If this is out of the aforementioned range listed, please consider follow up with your Primary Care Provider.  If you are age 99 or younger, your body mass index should be between 19-25. Your Body mass index is 30.47 kg/m. If this is out of the aformentioned range listed, please consider follow up with your Primary Care Provider.   ________________________________________________________  The [facility] providers would like to encourage you to use MYCHART to communicate with providers for non-urgent requests or questions.  Due to long hold times on the telephone, sending your provider a message by [facility] may be a faster and more efficient way to get a response.  Please allow 48 business hours for a response.  Please remember that this is for non-urgent requests.  _______________________________________________________

## 2023-10-25 NOTE — Telephone Encounter (Signed)
 2nd attempt: Called patient, NA, left message to contact our office to schedule telehealth appointment for pre-op clearance.

## 2023-10-25 NOTE — Telephone Encounter (Signed)
   Name: Renee [name]  DOB: February 28, 1946  MRN: [id number]  Primary Cardiologist: None   Preoperative team, please contact this patient and set up a phone call appointment for further preoperative risk assessment. Please obtain consent and complete medication review. Thank you for your help.  I confirm that guidance regarding antiplatelet and oral anticoagulation therapy has been completed and, if necessary, noted below.  Regarding ASA therapy, we recommend continuation of ASA throughout the perioperative period.  However, if the surgeon feels that cessation of ASA is required in the perioperative period, it may be stopped 5-7 days prior to surgery with a plan to resume it as soon as felt to be feasible from a surgical standpoint in the post-operative period.  I also confirmed the patient resides in the state of [place] . As per [facility] Medical Board telemedicine laws, the patient must reside in the state in which the provider is licensed.   Jude Norton, NP 10/25/2023, 10:36 AM [facility]

## 2023-10-26 ENCOUNTER — Telehealth: Payer: Self-pay

## 2023-10-26 NOTE — Telephone Encounter (Signed)
Patient has been scheduled for televisit.

## 2023-10-26 NOTE — Telephone Encounter (Signed)
 Patient has been scheduled for televisit and consent done     Patient Consent for Virtual Visit         CARIAH TOBEY has provided verbal consent on 10/26/2023 for a virtual visit (video or telephone).   CONSENT FOR VIRTUAL VISIT FOR:  Arlester Bence  By participating in this virtual visit I agree to the following:  I hereby voluntarily request, consent and authorize [facility] and its employed or contracted physicians, physician assistants, nurse practitioners or other licensed health care professionals (the Practitioner), to provide me with telemedicine health care services (the "Services") as deemed necessary by the treating Practitioner. I acknowledge and consent to receive the Services by the Practitioner via telemedicine. I understand that the telemedicine visit will involve communicating with the Practitioner through live audiovisual communication technology and the disclosure of certain medical information by electronic transmission. I acknowledge that I have been given the opportunity to request an in-person assessment or other available alternative prior to the telemedicine visit and am voluntarily participating in the telemedicine visit.  I understand that I have the right to withhold or withdraw my consent to the use of telemedicine in the course of my care at any time, without affecting my right to future care or treatment, and that the Practitioner or I may terminate the telemedicine visit at any time. I understand that I have the right to inspect all information obtained and/or recorded in the course of the telemedicine visit and may receive copies of available information for a reasonable fee.  I understand that some of the potential risks of receiving the Services via telemedicine include:  Delay or interruption in medical evaluation due to technological equipment failure or disruption; Information transmitted may not be sufficient (e.g. poor resolution of images) to  allow for appropriate medical decision making by the Practitioner; and/or  In rare instances, security protocols could fail, causing a breach of personal health information.  Furthermore, I acknowledge that it is my responsibility to provide information about my medical history, conditions and care that is complete and accurate to the best of my ability. I acknowledge that Practitioner's advice, recommendations, and/or decision may be based on factors not within their control, such as incomplete or inaccurate data provided by me or distortions of diagnostic images or specimens that may result from electronic transmissions. I understand that the practice of medicine is not an exact science and that Practitioner makes no warranties or guarantees regarding treatment outcomes. I acknowledge that a copy of this consent can be made available to me via my patient portal [facility] MyChart), or I can request a printed copy by calling the office of [facility].    I understand that my insurance will be billed for this visit.   I have read or had this consent read to me. I understand the contents of this consent, which adequately explains the benefits and risks of the Services being provided via telemedicine.  I have been provided ample opportunity to ask questions regarding this consent and the Services and have had my questions answered to my satisfaction. I give my informed consent for the services to be provided through the use of telemedicine in my medical care

## 2023-10-30 ENCOUNTER — Telehealth: Payer: Self-pay | Admitting: Internal Medicine

## 2023-10-30 ENCOUNTER — Ambulatory Visit (HOSPITAL_COMMUNITY)
Admission: RE | Admit: 2023-10-30 | Discharge: 2023-10-30 | Disposition: A | Discharge status: Home / Self Care | Source: Ambulatory Visit | Attending: Cardiology | Admitting: Cardiology

## 2023-10-30 ENCOUNTER — Encounter (HOSPITAL_COMMUNITY): Payer: Self-pay | Admitting: Cardiology

## 2023-10-30 ENCOUNTER — Ambulatory Visit (HOSPITAL_COMMUNITY): Payer: Self-pay | Admitting: Cardiology

## 2023-10-30 VITALS — BP 140/80 | HR 70 | Wt 172.2 lb

## 2023-10-30 DIAGNOSIS — I493 Ventricular premature depolarization: Secondary | ICD-10-CM | POA: Insufficient documentation

## 2023-10-30 DIAGNOSIS — L2989 Other pruritus: Secondary | ICD-10-CM | POA: Insufficient documentation

## 2023-10-30 DIAGNOSIS — I4581 Long QT syndrome: Secondary | ICD-10-CM | POA: Insufficient documentation

## 2023-10-30 DIAGNOSIS — Z006 Encounter for examination for normal comparison and control in clinical research program: Secondary | ICD-10-CM

## 2023-10-30 DIAGNOSIS — Z950 Presence of cardiac pacemaker: Secondary | ICD-10-CM | POA: Insufficient documentation

## 2023-10-30 DIAGNOSIS — R079 Chest pain, unspecified: Secondary | ICD-10-CM | POA: Insufficient documentation

## 2023-10-30 DIAGNOSIS — I442 Atrioventricular block, complete: Secondary | ICD-10-CM | POA: Diagnosis not present

## 2023-10-30 DIAGNOSIS — Z79899 Other long term (current) drug therapy: Secondary | ICD-10-CM | POA: Diagnosis not present

## 2023-10-30 DIAGNOSIS — R159 Full incontinence of feces: Secondary | ICD-10-CM | POA: Insufficient documentation

## 2023-10-30 DIAGNOSIS — I5032 Chronic diastolic (congestive) heart failure: Secondary | ICD-10-CM | POA: Insufficient documentation

## 2023-10-30 DIAGNOSIS — R0609 Other forms of dyspnea: Secondary | ICD-10-CM | POA: Diagnosis not present

## 2023-10-30 DIAGNOSIS — I110 Hypertensive heart disease with heart failure: Secondary | ICD-10-CM | POA: Diagnosis not present

## 2023-10-30 DIAGNOSIS — R109 Unspecified abdominal pain: Secondary | ICD-10-CM | POA: Insufficient documentation

## 2023-10-30 DIAGNOSIS — K219 Gastro-esophageal reflux disease without esophagitis: Secondary | ICD-10-CM | POA: Diagnosis not present

## 2023-10-30 DIAGNOSIS — R55 Syncope and collapse: Secondary | ICD-10-CM | POA: Diagnosis not present

## 2023-10-30 DIAGNOSIS — T508X5A Adverse effect of diagnostic agents, initial encounter: Secondary | ICD-10-CM | POA: Insufficient documentation

## 2023-10-30 DIAGNOSIS — G8929 Other chronic pain: Secondary | ICD-10-CM | POA: Diagnosis not present

## 2023-10-30 DIAGNOSIS — I509 Heart failure, unspecified: Secondary | ICD-10-CM

## 2023-10-30 DIAGNOSIS — I480 Paroxysmal atrial fibrillation: Secondary | ICD-10-CM | POA: Diagnosis not present

## 2023-10-30 DIAGNOSIS — Z9889 Other specified postprocedural states: Secondary | ICD-10-CM | POA: Diagnosis not present

## 2023-10-30 DIAGNOSIS — K5900 Constipation, unspecified: Secondary | ICD-10-CM | POA: Insufficient documentation

## 2023-10-30 DIAGNOSIS — I471 Supraventricular tachycardia, unspecified: Secondary | ICD-10-CM | POA: Insufficient documentation

## 2023-10-30 LAB — BASIC METABOLIC PANEL WITH GFR
Anion gap: 8 (ref 5–15)
BUN: 6 mg/dL — ABNORMAL LOW (ref 8–23)
CO2: 26 mmol/L (ref 22–32)
Calcium: 9.7 mg/dL (ref 8.9–10.3)
Chloride: 107 mmol/L (ref 98–111)
Creatinine, Ser: 0.73 mg/dL (ref 0.44–1.00)
GFR, Estimated: >60 mL/min (ref >60)
Glucose, Bld: 90 mg/dL (ref 70–99)
Potassium: 3.8 mmol/L (ref 3.5–5.1)
Sodium: 141 mmol/L (ref 135–145)

## 2023-10-30 LAB — ECHOCARDIOGRAM COMPLETE
AR max vel: 2.14 cm2
AV Area VTI: 2.2 cm2
AV Area mean vel: 2.13 cm2
AV Mean grad: 3 mmHg
AV Peak grad: 5.9 mmHg
Ao pk vel: 1.21 m/s
Area-P 1/2: 3.3 cm2
Calc EF: 63.6 %
MV M vel: 4.47 m/s
MV Peak grad: 79.9 mmHg
MV VTI: 1.49 cm2
S' Lateral: 3 cm
Single Plane A2C EF: 65.3 %
Single Plane A4C EF: 59.6 %

## 2023-10-30 LAB — LIPID PANEL
Cholesterol: 164 mg/dL (ref 0–200)
HDL: 64 mg/dL (ref >40)
LDL Cholesterol: 85 mg/dL (ref 0–99)
Total CHOL/HDL Ratio: 2.6 ratio
Triglycerides: 77 mg/dL (ref <150)
VLDL: 15 mg/dL (ref 0–40)

## 2023-10-30 MED ORDER — LOSARTAN POTASSIUM 50 MG PO TABS: 75.0000 mg | ORAL_TABLET | Freq: Two times a day (BID) | ORAL | 2 refills | Status: DC

## 2023-10-30 NOTE — Telephone Encounter (Signed)
 Pt. Calling in "to speak to a nurse and I didn't call to talk to an agent.I want to squeeze her neck!" Yelling at triage nurse, declines to be triaged. Requests Dr. Autry Legions or his nurse "call me, I'm sick of this and just want an antibiotic."

## 2023-10-30 NOTE — Patient Instructions (Signed)
 INCREASE Losartan  to 75 mg ( 1 1/2 tab) Twice daily  Labs done today, your results will be available in MyChart, we will contact you for abnormal readings.  Repeat blood work in 10 days.  Your physician recommends that you schedule a follow-up appointment in: 6 months ( November) ** PLEASE CALL THE OFFICE IN SEPTEMBER TO ARRANGE YOUR FOLLOW UP APPOINTMENT.**  If you have any questions or concerns before your next appointment please send us  a message through [place] or call our office at [phone].    TO LEAVE A MESSAGE FOR THE NURSE SELECT OPTION 2, PLEASE LEAVE A MESSAGE INCLUDING: YOUR NAME DATE OF BIRTH CALL BACK NUMBER REASON FOR CALL**this is important as we prioritize the call backs  YOU WILL RECEIVE A CALL BACK THE SAME DAY AS LONG AS YOU CALL BEFORE 4:00 PM  At the [facility], you and your health needs are our priority. As part of our continuing mission to provide you with exceptional heart care, we have created designated Provider Care Teams. These Care Teams include your primary Cardiologist (physician) and Advanced Practice Providers (APPs- Physician Assistants and Nurse Practitioners) who all work together to provide you with the care you need, when you need it.   You may see any of the following providers on your designated Care Team at your next follow up: Dr Jules Oar Dr Peder Bourdon Dr. Alwin Baars Dr. Arta Lark Amy Marijane Shoulders, NP Ruddy Corral, [place] [facility] [place], [place] Dennise Fitz, NP [place] Lee, NP Shawnee Dellen, NP Luster Salters, PharmD Bevely Brush, PharmD   Please be sure to bring in all your medications bottles to every appointment.    Thank you for choosing [facility]

## 2023-10-30 NOTE — Research (Signed)
 SITE: [id number]     Subject # [id number]    Subprotocol: A  Inclusion Criteria  Patients who meet all of the following criteria are eligible for enrollment as study participants:  Yes No  Age > 78 years old X   Eligible to wear Holter Study X    Exclusion Criteria  Patients who meet any of these criteria are not eligible for enrollment as study participants: Yes No  1. Receiving any mechanical (respiratory or circulatory) or renal support therapy at Screening or during Visit #1.  X  2.  Any other conditions that in the opinion of the investigators are likely to prevent compliance with the study protocol or pose a safety concern if the subject participates in the study.  X  3. Poor tolerance, namely susceptible to severe skin allergies from ECG adhesive patch application.  X   Protocol: REV H    60 minute start window         Cor device must be applied, and the study initiated, no later than 60 minutes of completing the Echocardiogram                             HH:MM  Echo completion time  14:34  2.   Cor Study start time  14:37   30-Minute execution window  Once Cor Monitoring begins, 3 QT Med ECGs and the 15-minute rest period must be completed within a 30 minute window     HH:MM  3. QT Med ECG Completion time  14:42  4. Start of 15-Min sitting rest period  14:43  5. End of 15-Min rest period  14:59  6. Time of device removal  15:08   *Continue to use the Mobile App Event feature to log the Rest period windows and follow instructions on the EF-ACT Clinical Trial  Patient Instruction Card.  Describe any anomalies in Protocol execution in the Protocol Deviation Log    Residential Zip code [phone] (First 3 digits ONLY)                                           [facility] Informed Consent   Subject Name: Renee [name]  Subject met inclusion and exclusion criteria.  The informed consent form, study requirements and expectations were reviewed with the subject. Subject had opportunity to  read consent and questions and concerns were addressed prior to the signing of the consent form.  The subject verbalized understanding of the trial requirements.  The subject agreed to participate in the [facility] EF ACT trial and signed the informed consent at 14:32 on 30-Oct-2023.  The informed consent was obtained prior to performance of any protocol-specific procedures for the subject.  A copy of the signed informed consent was given to the subject and a copy was placed in the subject's medical record.   Jolee Naval          Current Outpatient Medications:    ACCU-CHEK FASTCLIX LANCETS MISC, USE UP TO 4 TIMES DAILY AS  DIRECTED, Disp: 408 each, Rfl: 1   ACCU-CHEK GUIDE test strip, USE UP TO 4 TIMES DAILY AS  DIRECTED, Disp: 100 each, Rfl: 1   ALPRAZolam  (XANAX ) 0.5 MG tablet, Take 0.5-1 tablets (0.25-0.5 mg total) by mouth daily as needed for anxiety. (Patient not taking: Reported on 10/30/2023), Disp: 90 tablet, Rfl:  1   aspirin  EC 81 MG tablet, Take 81 mg by mouth daily., Disp: , Rfl:    blood glucose meter kit and supplies KIT, Dispense per patient and insurance preferred. Use up to four times daily as directed. E11.9, Disp: 1 each, Rfl: 0   cetirizine (ZYRTEC) 10 MG tablet, Take 10 mg by mouth daily as needed., Disp: , Rfl:    diltiazem  (CARDIZEM  CD) 360 MG 24 hr capsule, Take 1 capsule (360 mg total) by mouth every morning., Disp: 30 capsule, Rfl: 0   diltiazem  (CARDIZEM ) 30 MG tablet, Take 2 tablets (60 mg total) by mouth 4 (four) times daily as needed (as needed for breakthrough heart racing/palpitations)., Disp: 90 tablet, Rfl: 0   flecainide  (TAMBOCOR ) 50 MG tablet, TAKE 1 TABLET BY MOUTH TWICE  DAILY, Disp: 200 tablet, Rfl: 2   furosemide  (LASIX ) 20 MG tablet, Take 1 tablet (20 mg total) by mouth daily as needed for fluid., Disp: 90 tablet, Rfl: 3   losartan  (COZAAR ) 50 MG tablet, TAKE 1 TABLET BY MOUTH TWICE  DAILY, Disp: 200 tablet, Rfl: 2   nitroGLYCERIN  (NITROSTAT ) 0.4 MG SL  tablet, DISSOLVE 1 TABLET UNDER THE  TONGUE EVERY 5 MINUTES AS NEEDED FOR CHEST PAIN. MAX OF 3 TABLETS IN 15 MINUTES. CALL 911 IF PAIN  PERSISTS., Disp: 125 tablet, Rfl: 2   omeprazole  (PRILOSEC) 40 MG capsule, TAKE 1 CAPSULE BY MOUTH IN  THE MORNING AND AT BEDTIME, Disp: 180 capsule, Rfl: 3   trimethoprim  (TRIMPEX ) 100 MG tablet, Take 100 mg by mouth daily as needed (For UTI per patient)., Disp: , Rfl:    zolpidem  (AMBIEN ) 5 MG tablet, TAKE 1 TABLET BY MOUTH AT  BEDTIME AS NEEDED FOR SLEEP (Patient not taking: Reported on 10/30/2023), Disp: 90 tablet, Rfl: 1

## 2023-10-30 NOTE — Progress Notes (Signed)
  Echocardiogram 2D Echocardiogram has been performed.  Melannie Metzner L Gleason Ardoin RDCS 10/30/2023, 2:34 PM

## 2023-10-31 ENCOUNTER — Ambulatory Visit: Payer: Self-pay | Admitting: Pediatrics

## 2023-10-31 ENCOUNTER — Ambulatory Visit (HOSPITAL_COMMUNITY): Payer: Self-pay | Admitting: Cardiology

## 2023-10-31 ENCOUNTER — Ambulatory Visit (HOSPITAL_COMMUNITY)
Admission: RE | Admit: 2023-10-31 | Discharge: 2023-10-31 | Disposition: A | Discharge status: Home / Self Care | Source: Ambulatory Visit | Attending: Pediatrics | Admitting: Pediatrics

## 2023-10-31 DIAGNOSIS — K219 Gastro-esophageal reflux disease without esophagitis: Secondary | ICD-10-CM

## 2023-10-31 DIAGNOSIS — R109 Unspecified abdominal pain: Secondary | ICD-10-CM | POA: Diagnosis not present

## 2023-10-31 DIAGNOSIS — I5032 Chronic diastolic (congestive) heart failure: Secondary | ICD-10-CM | POA: Diagnosis not present

## 2023-10-31 DIAGNOSIS — Z9049 Acquired absence of other specified parts of digestive tract: Secondary | ICD-10-CM | POA: Diagnosis not present

## 2023-10-31 DIAGNOSIS — R159 Full incontinence of feces: Secondary | ICD-10-CM

## 2023-10-31 DIAGNOSIS — Z950 Presence of cardiac pacemaker: Secondary | ICD-10-CM | POA: Diagnosis not present

## 2023-10-31 DIAGNOSIS — K5900 Constipation, unspecified: Secondary | ICD-10-CM

## 2023-10-31 DIAGNOSIS — Z9889 Other specified postprocedural states: Secondary | ICD-10-CM | POA: Diagnosis not present

## 2023-10-31 DIAGNOSIS — R0609 Other forms of dyspnea: Secondary | ICD-10-CM | POA: Diagnosis not present

## 2023-10-31 DIAGNOSIS — I4581 Long QT syndrome: Secondary | ICD-10-CM | POA: Diagnosis not present

## 2023-10-31 DIAGNOSIS — G8929 Other chronic pain: Secondary | ICD-10-CM | POA: Diagnosis not present

## 2023-10-31 DIAGNOSIS — R55 Syncope and collapse: Secondary | ICD-10-CM | POA: Diagnosis not present

## 2023-10-31 DIAGNOSIS — I480 Paroxysmal atrial fibrillation: Secondary | ICD-10-CM | POA: Diagnosis not present

## 2023-10-31 DIAGNOSIS — I493 Ventricular premature depolarization: Secondary | ICD-10-CM | POA: Diagnosis not present

## 2023-10-31 DIAGNOSIS — I110 Hypertensive heart disease with heart failure: Secondary | ICD-10-CM | POA: Diagnosis not present

## 2023-10-31 DIAGNOSIS — Z79899 Other long term (current) drug therapy: Secondary | ICD-10-CM | POA: Diagnosis not present

## 2023-10-31 DIAGNOSIS — I442 Atrioventricular block, complete: Secondary | ICD-10-CM | POA: Diagnosis not present

## 2023-10-31 DIAGNOSIS — T508X5A Adverse effect of diagnostic agents, initial encounter: Secondary | ICD-10-CM | POA: Diagnosis not present

## 2023-10-31 DIAGNOSIS — L2989 Other pruritus: Secondary | ICD-10-CM | POA: Diagnosis not present

## 2023-10-31 DIAGNOSIS — R079 Chest pain, unspecified: Secondary | ICD-10-CM | POA: Diagnosis not present

## 2023-10-31 DIAGNOSIS — I471 Supraventricular tachycardia, unspecified: Secondary | ICD-10-CM | POA: Diagnosis not present

## 2023-10-31 MED ORDER — IOHEXOL 300 MG/ML  SOLN
100.0000 mL | Freq: Once | INTRAMUSCULAR | Status: AC | PRN
  Administered 2023-10-31: 100 mL via INTRAVENOUS

## 2023-10-31 MED ORDER — DEXAMETHASONE SODIUM PHOSPHATE 10 MG/ML IJ SOLN
10.0000 mg | Freq: Once | INTRAMUSCULAR | Status: AC
  Administered 2023-10-31: 10 mg via INTRAVENOUS

## 2023-10-31 MED ORDER — IOHEXOL 9 MG/ML PO SOLN
500.0000 mL | ORAL | Status: AC
  Administered 2023-10-31 (×2): 500 mL via ORAL

## 2023-10-31 MED ORDER — DEXAMETHASONE SODIUM PHOSPHATE 10 MG/ML IJ SOLN
INTRAMUSCULAR | Status: AC
  Filled 2023-10-31: qty 1
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
## 2023-10-31 NOTE — Progress Notes (Signed)
  IR BRIEF NOTE:  I was requested to evaluate a patient with a possible contrast allergy  s/p CT with 100 mg of Omnipaque  300 administered for study. Upon arriving, patient appeared anxious, and was scratching her legs, arms, and abdomen. She stated she was itch and felt swollen. She denied shortness of breath.   On physical exam, patient did not have any visible swelling, rash, or other symptoms of an allergic reaction. Lungs were clear, and pulse mildly tachy.   She stated she was a "cardiac patient with a pacemaker". Patient was difficult to calm down initially. As she drove herself, I deferred Benadryl  administration and opted for 10 mg decadron  IV. Famotidine  was not readily available. Patient initially refused medications, stating "it may prolong QT interval".   Patient was gradually improving, and quickly was not longer itching. She kept stating "I feel funny". I advised patient that we can transport her to ED for complete evaluation, though I encourage her to accept the 10 cc of decadron  first. Patient agreed, and decadron  10 mg administered. Patient rapidly improved.  VS: 98% O2, Pulse 60, BP 117/64, RR 18.  Patient improved, ambulated to bathroom, and return to CT room. She stated she felt improved and was ready to leave. Patient's IV was removed and she was allowed to leave.   Electronically Signed: Lovena Rubinstein, PA-C 10/31/2023, 6:12 PM

## 2023-11-07 ENCOUNTER — Ambulatory Visit (INDEPENDENT_AMBULATORY_CARE_PROVIDER_SITE_OTHER): Payer: Medicare Other

## 2023-11-07 DIAGNOSIS — R55 Syncope and collapse: Secondary | ICD-10-CM

## 2023-11-07 DIAGNOSIS — I5032 Chronic diastolic (congestive) heart failure: Secondary | ICD-10-CM

## 2023-11-08 LAB — CUP PACEART REMOTE DEVICE CHECK
Battery Voltage: 45
Date Time Interrogation Session: 20250527083252
Implantable Lead Connection Status: 753985
Implantable Lead Connection Status: 753985
Implantable Lead Implant Date: 20170921
Implantable Lead Implant Date: 20170921
Implantable Lead Location: 753859
Implantable Lead Location: 753860
Implantable Lead Model: 377
Implantable Lead Model: 377
Implantable Lead Serial Number: 49575519
Implantable Lead Serial Number: 49580836
Implantable Pulse Generator Implant Date: 20170921
Pulse Gen Model: 407145
Pulse Gen Serial Number: 68850891

## 2023-11-09 ENCOUNTER — Ambulatory Visit: Payer: Self-pay | Admitting: Internal Medicine

## 2023-11-13 ENCOUNTER — Ambulatory Visit (HOSPITAL_COMMUNITY)
Admission: RE | Admit: 2023-11-13 | Discharge: 2023-11-13 | Disposition: A | Discharge status: Home / Self Care | Source: Ambulatory Visit | Attending: Cardiology | Admitting: Cardiology

## 2023-11-13 ENCOUNTER — Other Ambulatory Visit (HOSPITAL_COMMUNITY)

## 2023-11-13 DIAGNOSIS — I5032 Chronic diastolic (congestive) heart failure: Secondary | ICD-10-CM

## 2023-11-13 LAB — BASIC METABOLIC PANEL WITH GFR
Anion gap: 9 (ref 5–15)
BUN: 9 mg/dL (ref 8–23)
CO2: 23 mmol/L (ref 22–32)
Calcium: 9.4 mg/dL (ref 8.9–10.3)
Chloride: 105 mmol/L (ref 98–111)
Creatinine, Ser: 0.81 mg/dL (ref 0.44–1.00)
GFR, Estimated: >60 mL/min (ref >60)
Glucose, Bld: 131 mg/dL — ABNORMAL HIGH (ref 70–99)
Potassium: 4.1 mmol/L (ref 3.5–5.1)
Sodium: 137 mmol/L (ref 135–145)

## 2023-11-15 ENCOUNTER — Encounter (HOSPITAL_COMMUNITY): Payer: Self-pay | Admitting: Cardiology

## 2023-11-15 DIAGNOSIS — R0602 Shortness of breath: Secondary | ICD-10-CM

## 2023-11-20 NOTE — Telephone Encounter (Signed)
 Follow up after appt 6/27

## 2023-11-21 ENCOUNTER — Ambulatory Visit: Admitting: Pediatrics

## 2023-11-22 ENCOUNTER — Ambulatory Visit

## 2023-11-22 ENCOUNTER — Other Ambulatory Visit (INDEPENDENT_AMBULATORY_CARE_PROVIDER_SITE_OTHER)

## 2023-11-22 VITALS — BP 128/68 | HR 74 | Ht 64.0 in | Wt 171.6 lb

## 2023-11-22 DIAGNOSIS — R739 Hyperglycemia, unspecified: Secondary | ICD-10-CM | POA: Diagnosis not present

## 2023-11-22 DIAGNOSIS — E559 Vitamin D deficiency, unspecified: Secondary | ICD-10-CM

## 2023-11-22 DIAGNOSIS — H9193 Unspecified hearing loss, bilateral: Secondary | ICD-10-CM | POA: Diagnosis not present

## 2023-11-22 DIAGNOSIS — E538 Deficiency of other specified B group vitamins: Secondary | ICD-10-CM | POA: Diagnosis not present

## 2023-11-22 DIAGNOSIS — Z0000 Encounter for general adult medical examination without abnormal findings: Secondary | ICD-10-CM | POA: Diagnosis not present

## 2023-11-22 DIAGNOSIS — I10 Essential (primary) hypertension: Secondary | ICD-10-CM

## 2023-11-22 LAB — CBC WITH DIFFERENTIAL/PLATELET
Basophils Absolute: 0 10*3/uL (ref 0.0–0.1)
Basophils Relative: 0.6 % (ref 0.0–3.0)
Eosinophils Absolute: 0.1 10*3/uL (ref 0.0–0.7)
Eosinophils Relative: 1.6 % (ref 0.0–5.0)
HCT: 39.1 % (ref 36.0–46.0)
Hemoglobin: 12.7 g/dL (ref 12.0–15.0)
Lymphocytes Relative: 36.1 % (ref 12.0–46.0)
Lymphs Abs: 1.8 10*3/uL (ref 0.7–4.0)
MCHC: 32.4 g/dL (ref 30.0–36.0)
MCV: 78.4 fl (ref 78.0–100.0)
Monocytes Absolute: 0.3 10*3/uL (ref 0.1–1.0)
Monocytes Relative: 5.8 % (ref 3.0–12.0)
Neutro Abs: 2.7 10*3/uL (ref 1.4–7.7)
Neutrophils Relative %: 55.9 % (ref 43.0–77.0)
Platelets: 251 10*3/uL (ref 150.0–400.0)
RBC: 4.99 Mil/uL (ref 3.87–5.11)
RDW: 14.6 % (ref 11.5–15.5)
WBC: 4.9 10*3/uL (ref 4.0–10.5)

## 2023-11-22 LAB — BASIC METABOLIC PANEL WITH GFR
BUN: 9 mg/dL (ref 6–23)
CO2: 27 meq/L (ref 19–32)
Calcium: 9.7 mg/dL (ref 8.4–10.5)
Chloride: 103 meq/L (ref 96–112)
Creatinine, Ser: 0.75 mg/dL (ref 0.40–1.20)
GFR: 76.34 mL/min (ref >60.00)
Glucose, Bld: 113 mg/dL — ABNORMAL HIGH (ref 70–99)
Potassium: 4.2 meq/L (ref 3.5–5.1)
Sodium: 137 meq/L (ref 135–145)

## 2023-11-22 LAB — HEPATIC FUNCTION PANEL
ALT: 17 U/L (ref 0–35)
AST: 15 U/L (ref 0–37)
Albumin: 4.3 g/dL (ref 3.5–5.2)
Alkaline Phosphatase: 120 U/L — ABNORMAL HIGH (ref 39–117)
Bilirubin, Direct: 0.2 mg/dL (ref 0.0–0.3)
Total Bilirubin: 1 mg/dL (ref 0.2–1.2)
Total Protein: 6.7 g/dL (ref 6.0–8.3)

## 2023-11-22 LAB — LIPID PANEL
Cholesterol: 146 mg/dL (ref 0–200)
HDL: 58.9 mg/dL (ref >39.00)
LDL Cholesterol: 66 mg/dL (ref 0–99)
NonHDL: 86.87
Total CHOL/HDL Ratio: 2
Triglycerides: 102 mg/dL (ref 0.0–149.0)
VLDL: 20.4 mg/dL (ref 0.0–40.0)

## 2023-11-22 LAB — MICROALBUMIN / CREATININE URINE RATIO
Creatinine,U: 94.5 mg/dL
Microalb Creat Ratio: 8 mg/g (ref 0.0–30.0)
Microalb, Ur: 0.8 mg/dL (ref 0.0–1.9)

## 2023-11-22 LAB — HEMOGLOBIN A1C: Hgb A1c MFr Bld: 6.3 % (ref 4.6–6.5)

## 2023-11-22 NOTE — Patient Instructions (Signed)
 [name] , Thank you for taking time out of your busy schedule to complete your Annual Wellness Visit with me. I enjoyed our conversation and look forward to speaking with you again next year. I, as well as your care team,  appreciate your ongoing commitment to your health goals. Please review the following plan we discussed and let me know if I can assist you in the future. Your Game plan/ To Do List    Referrals: If you haven't heard from the office you've been referred to, please reach out to them at the phone provided.  Ordered Diabetic Kidney Urine and Hemoglobin A1C level Follow up Visits: Next Medicare AWV with our clinical staff: 11/28/2024   Have you seen your provider in the last 6 months (3 months if uncontrolled diabetes)? No Next Office Visit with your provider: 04/09/2024  Clinician Recommendations:  Aim for 30 minutes of exercise or brisk walking, 6-8 glasses of water , and 5 servings of fruits and vegetables each day. Educated and advised on getting the COVID, Shingles, and Tdap (Tetenus) vaccines at [facility].      This is a list of the screening recommended for you and due dates:  Health Maintenance  Topic Date Due   Zoster (Shingles) Vaccine (1 of 2) Never done   Eye exam for diabetics  05/03/2022   Hemoglobin A1C  09/22/2022   Yearly kidney health urinalysis for diabetes  03/24/2023   DTaP/Tdap/Td vaccine (2 - Td or Tdap) 09/29/2023   Flu Shot  01/12/2024   Complete foot exam   04/06/2024   Yearly kidney function blood test for diabetes  11/12/2024   Medicare Annual Wellness Visit  11/21/2024   Colon Cancer Screening  08/14/2025   Pneumonia Vaccine  Completed   DEXA scan (bone density measurement)  Completed   Hepatitis C Screening  Completed   HPV Vaccine  Aged Out   Meningitis B Vaccine  Aged Out   COVID-19 Vaccine  Discontinued    Advanced directives: (Copy Requested) Please bring a copy of your health care power of attorney and living will to the office  to be added to your chart at your convenience. You can mail to [facility] [address] [place], [place] [address] or email to [email or web address] Advance Care Planning is important because it:  [x]  Makes sure you receive the medical care that is consistent with your values, goals, and preferences  [x]  It provides guidance to your family and loved ones and reduces their decisional burden about whether or not they are making the right decisions based on your wishes.  Follow the link provided in your after visit summary or read over the paperwork we have mailed to you to help you started getting your Advance Directives in place. If you need assistance in completing these, please reach out to us  so that we can help you!
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
## 2023-11-23 ENCOUNTER — Other Ambulatory Visit: Payer: Self-pay

## 2023-11-23 ENCOUNTER — Emergency Department (HOSPITAL_COMMUNITY)

## 2023-11-23 ENCOUNTER — Emergency Department (HOSPITAL_COMMUNITY): Admission: EM | Admit: 2023-11-23 | Discharge: 2023-11-23 | Disposition: A | Discharge status: Home / Self Care

## 2023-11-23 DIAGNOSIS — M546 Pain in thoracic spine: Secondary | ICD-10-CM | POA: Diagnosis not present

## 2023-11-23 DIAGNOSIS — R079 Chest pain, unspecified: Secondary | ICD-10-CM | POA: Diagnosis not present

## 2023-11-23 DIAGNOSIS — R0789 Other chest pain: Secondary | ICD-10-CM | POA: Insufficient documentation

## 2023-11-23 DIAGNOSIS — M25511 Pain in right shoulder: Secondary | ICD-10-CM | POA: Insufficient documentation

## 2023-11-23 DIAGNOSIS — I509 Heart failure, unspecified: Secondary | ICD-10-CM | POA: Insufficient documentation

## 2023-11-23 DIAGNOSIS — Z7982 Long term (current) use of aspirin: Secondary | ICD-10-CM | POA: Insufficient documentation

## 2023-11-23 DIAGNOSIS — Z8679 Personal history of other diseases of the circulatory system: Secondary | ICD-10-CM

## 2023-11-23 DIAGNOSIS — Z950 Presence of cardiac pacemaker: Secondary | ICD-10-CM | POA: Diagnosis not present

## 2023-11-23 LAB — CBC
HCT: 39.3 % (ref 36.0–46.0)
Hemoglobin: 12.6 g/dL (ref 12.0–15.0)
MCH: 25.8 pg — ABNORMAL LOW (ref 26.0–34.0)
MCHC: 32.1 g/dL (ref 30.0–36.0)
MCV: 80.5 fL (ref 80.0–100.0)
Platelets: 268 10*3/uL (ref 150–400)
RBC: 4.88 MIL/uL (ref 3.87–5.11)
RDW: 13.6 % (ref 11.5–15.5)
WBC: 5.5 10*3/uL (ref 4.0–10.5)
nRBC: 0 % (ref 0.0–0.2)

## 2023-11-23 LAB — BASIC METABOLIC PANEL WITH GFR
Anion gap: 9 (ref 5–15)
BUN: 8 mg/dL (ref 8–23)
CO2: 21 mmol/L — ABNORMAL LOW (ref 22–32)
Calcium: 9.4 mg/dL (ref 8.9–10.3)
Chloride: 107 mmol/L (ref 98–111)
Creatinine, Ser: 0.73 mg/dL (ref 0.44–1.00)
GFR, Estimated: >60 mL/min (ref >60)
Glucose, Bld: 158 mg/dL — ABNORMAL HIGH (ref 70–99)
Potassium: 3.6 mmol/L (ref 3.5–5.1)
Sodium: 137 mmol/L (ref 135–145)

## 2023-11-23 LAB — TROPONIN I (HIGH SENSITIVITY)
Troponin I (High Sensitivity): 4 ng/L (ref <18)
Troponin I (High Sensitivity): 4 ng/L (ref <18)

## 2023-11-23 LAB — BRAIN NATRIURETIC PEPTIDE: B Natriuretic Peptide: 28.8 pg/mL (ref 0.0–100.0)

## 2023-11-23 NOTE — ED Notes (Signed)
 Provided patient with discharge paperwork. Pt demonstrated understanding. All questions, comments, and concerns addressed.

## 2023-11-23 NOTE — ED Notes (Signed)
 ED Provider at bedside.

## 2023-11-23 NOTE — ED Provider Triage Note (Signed)
 Emergency Medicine Provider Triage Evaluation Note  Renee [name] , a 78 y.o. female  was evaluated in triage.  Pt complains of cp. Report acute onset of pain to R shoulder blade radiates to chest approximately 45 mins ago.  Some lightheadedness.  No fever, sob, nausea, diaphoresis.  Hx of heart disease and has Visual merchandiser.   Review of Systems  Positive: As above Negative: As above  Physical Exam  BP (!) 150/84 (BP Location: Left Arm)   Pulse 90   Temp 98 F (36.7 C) (Oral)   Resp (!) 22   SpO2 97%  Gen:   Awake, no distress   Resp:  Normal effort  MSK:   Moves extremities without difficulty  Other:    Medical Decision Making  Medically screening exam initiated at 12:01 PM.  Appropriate orders placed.  Renee [name] was informed that the remainder of the evaluation will be completed by another provider, this initial triage assessment does not replace that evaluation, and the importance of remaining in the ED until their evaluation is complete.     Debbra Fairy, PA-C 11/23/23 1213

## 2023-11-23 NOTE — ED Notes (Signed)
 [employer] pacemaker interrogated, company called, and report should be faxed shortly.

## 2023-11-23 NOTE — ED Triage Notes (Addendum)
 Pt. Stated, I started having right shoulder pain and its going through my chest it started 30 to 45 minutes ago. Im also having a headache.  I also have a pacemaker.

## 2023-11-23 NOTE — ED Notes (Signed)
 Patient ambulated with no assistance. 99% RA 82HR

## 2023-11-23 NOTE — Discharge Instructions (Addendum)
 Follow-up with your primary care doctor and your cardiologist.  Call the office to make an appointment.  You can use Tylenol  as needed for pain.  Return to the ER if your symptoms worsen.

## 2023-11-23 NOTE — ED Provider Notes (Signed)
 [facility] Provider Note   CSN: [id number] Arrival date & time: 11/23/23  1132     Patient presents with: right shoulder pain, Chest Pain, and Headache   Renee [name] is a 78 y.o. female.   78 year old female presents for evaluation of back pain and chest pain.  States she had pain located in her right shoulder that radiated through to her heart.  States she has a history of CHF and pacemaker and takes intermittent diuretics but has not needed anything if she is not had any swelling.  She does admit to some increased shortness of breath with exertion.  She denies any other symptoms or concerns at this time.   Chest Pain Associated symptoms: back pain and headache   Associated symptoms: no abdominal pain, no cough, no fever, no palpitations, no shortness of breath and no vomiting   Headache Associated symptoms: back pain   Associated symptoms: no abdominal pain, no cough, no ear pain, no eye pain, no fever, no seizures, no sore throat and no vomiting        Prior to Admission medications   Medication Sig Start Date End Date Taking? Authorizing Provider  ACCU-CHEK FASTCLIX LANCETS MISC USE UP TO 4 TIMES DAILY AS  DIRECTED 04/11/18   Roslyn Coombe, MD  ACCU-CHEK GUIDE test strip USE UP TO 4 TIMES DAILY AS  DIRECTED 04/11/18   Roslyn Coombe, MD  ALPRAZolam  (XANAX ) 0.5 MG tablet Take 0.5-1 tablets (0.25-0.5 mg total) by mouth daily as needed for anxiety. 03/23/22   Roslyn Coombe, MD  aspirin  EC 81 MG tablet Take 81 mg by mouth daily.    [provider]  blood glucose meter kit and supplies KIT Dispense per patient and insurance preferred. Use up to four times daily as directed. E11.9 02/21/18   Roslyn Coombe, MD  cetirizine (ZYRTEC) 10 MG tablet Take 10 mg by mouth daily as needed.    [provider]  diltiazem  (CARDIZEM  CD) 360 MG 24 hr capsule Take 1 capsule (360 mg total) by mouth every morning. 06/16/23   Darlis Eisenmenger, MD  diltiazem  (CARDIZEM ) 30 MG tablet Take 2 tablets (60 mg total) by mouth 4 (four) times daily as needed (as needed for breakthrough heart racing/palpitations). 08/26/22   Ursuy, Renee Lynn, PA-C  flecainide  (TAMBOCOR ) 50 MG tablet TAKE 1 TABLET BY MOUTH TWICE  DAILY 07/25/23   Tammie Fall, MD  furosemide  (LASIX ) 20 MG tablet Take 1 tablet (20 mg total) by mouth daily as needed for fluid. 10/23/23   Darlis Eisenmenger, MD  losartan  (COZAAR ) 50 MG tablet Take 1.5 tablets (75 mg total) by mouth 2 (two) times daily. 10/30/23   Darlis Eisenmenger, MD  nitroGLYCERIN  (NITROSTAT ) 0.4 MG SL tablet DISSOLVE 1 TABLET UNDER THE  TONGUE EVERY 5 MINUTES AS NEEDED FOR CHEST PAIN. MAX OF 3 TABLETS IN 15 MINUTES. CALL 911 IF PAIN  PERSISTS. 08/01/22   Darlis Eisenmenger, MD  omeprazole  (PRILOSEC) 40 MG capsule TAKE 1 CAPSULE BY MOUTH IN  THE MORNING AND AT BEDTIME 11/15/21   Asencion Blacksmith, MD  trimethoprim  (TRIMPEX ) 100 MG tablet Take 100 mg by mouth daily as needed (For UTI per patient). 06/24/20   [provider]  zolpidem  (AMBIEN ) 5 MG tablet TAKE 1 TABLET BY MOUTH AT  BEDTIME AS NEEDED FOR SLEEP 12/22/20   Roslyn Coombe, MD    Allergies: Cyclobenzaprine, Azithromycin , Nitrofurantoin, Adhesive [tape],  Moxifloxacin, and Quinolones    Review of Systems  Constitutional:  Negative for chills and fever.  HENT:  Negative for ear pain and sore throat.   Eyes:  Negative for pain and visual disturbance.  Respiratory:  Negative for cough and shortness of breath.   Cardiovascular:  Positive for chest pain. Negative for palpitations.  Gastrointestinal:  Negative for abdominal pain and vomiting.  Genitourinary:  Negative for dysuria and hematuria.  Musculoskeletal:  Positive for back pain. Negative for arthralgias.  Skin:  Negative for color change and rash.  Neurological:  Positive for headaches. Negative for seizures and syncope.  All other systems reviewed and are negative.   Updated Vital Signs BP 110/69    Pulse 63   Temp 98 F (36.7 C) (Oral)   Resp 18   SpO2 99%   Physical Exam Vitals and nursing note reviewed.  Constitutional:      General: She is not in acute distress.    Appearance: She is well-developed. She is not ill-appearing.  HENT:     Head: Normocephalic and atraumatic.   Eyes:     Conjunctiva/sclera: Conjunctivae normal.    Cardiovascular:     Rate and Rhythm: Normal rate and regular rhythm.     Heart sounds: No murmur heard. Pulmonary:     Effort: Pulmonary effort is normal. No tachypnea or respiratory distress.     Breath sounds: Normal breath sounds. No decreased breath sounds.  Chest:     Chest wall: No tenderness or edema.  Abdominal:     Palpations: Abdomen is soft. There is no mass.     Tenderness: There is no abdominal tenderness.   Musculoskeletal:        General: No swelling. Normal range of motion.     Cervical back: Neck supple.     Right lower leg: No edema.     Left lower leg: No edema.     Comments: There is mild right middle thoracic spine musculature tenderness to palpation   Skin:    General: Skin is warm and dry.     Capillary Refill: Capillary refill takes less than 2 seconds.   Neurological:     Mental Status: She is alert.   Psychiatric:        Mood and Affect: Mood normal.     (all labs ordered are listed, but only abnormal results are displayed) Labs Reviewed  BASIC METABOLIC PANEL WITH GFR - Abnormal; Notable for the following components:      Result Value   CO2 21 (*)    Glucose, Bld 158 (*)    All other components within normal limits  CBC - Abnormal; Notable for the following components:   MCH 25.8 (*)    All other components within normal limits  BRAIN NATRIURETIC PEPTIDE  TROPONIN I (HIGH SENSITIVITY)  TROPONIN I (HIGH SENSITIVITY)    EKG: None  Radiology: DG Chest 2 View Result Date: 11/23/2023 CLINICAL DATA:  Chest pain. EXAM: CHEST - 2 VIEW COMPARISON:  June 16, 2023. FINDINGS: The heart size and  mediastinal contours are within normal limits. Left-sided pacemaker is unchanged in position. Both lungs are clear. The visualized skeletal structures are unremarkable. IMPRESSION: No active cardiopulmonary disease. Electronically Signed   By: Rosalene Colon M.D.   On: 11/23/2023 12:45     Procedures   Medications Ordered in the ED - No data to display  Medical Decision Making Medical Decision Making Nursing notes are reviewed. Differential diagnosis for this patient would include but not limited to: CHF, ACS, anxiety, spasm, strain, GERD, other  Records reviewed: Previous echocardiogram from 6/9 reviewed and shows EF of 55 to 60%  Studies: Chest x-ray interpreted independently at radiology and shows no evidence of CHF or acute disease in the chest  EKG interpretation: Interpreted by me in the absence of cardiologist shows sinus rhythm, no acute change when compared to prior EKG  Cardiac monitor interpretation: Sinus rhythm, no ectopy  Emergency Department Course:  Vital signs and pulse oximetry are reviewed, evaluated by myself and found to be within normal limits prior to final disposition. Findings of laboratory testing and medical imaging are discussed with patient and family that is available. Patient agrees with the medical care plan as follows:  Patient's lab workup reviewed by me and unremarkable.  She is able to ambulate without any desaturations on pulse ox.  Pacemaker was sent for interrogation, however her episode of chest pain does not sound cardiac in nature and likely is musculoskeletal and not related to his pacemaker.  She follows closely with cardiology and her primary care and I advised her to continue stay on her medications as prescribed, use Tylenol  as needed for pain and continue follow-up with her specialist.  Advise return to the ER for new or worsening symptoms.  She was comfortable with plan be discharged home.  Amount  and/or Complexity of Data Reviewed External Data Reviewed: notes. Labs: ordered. Decision-making details documented in ED Course. Radiology: ordered and independent interpretation performed. Decision-making details documented in ED Course. ECG/medicine tests: ordered and independent interpretation performed. Decision-making details documented in ED Course.  Risk OTC drugs. Prescription drug management.     Final diagnoses:  None    ED Discharge Orders     None          Kelle Pate, DO 11/23/23 1636

## 2023-11-24 LAB — TSH: TSH: 0.85 u[IU]/mL (ref 0.35–5.50)

## 2023-11-25 ENCOUNTER — Ambulatory Visit: Payer: Self-pay | Admitting: Internal Medicine

## 2023-11-25 LAB — VITAMIN B12: Vitamin B-12: 227 pg/mL (ref 211–911)

## 2023-11-25 LAB — VITAMIN D 25 HYDROXY (VIT D DEFICIENCY, FRACTURES): VITD: 33.4 ng/mL (ref 30.00–100.00)

## 2023-11-25 NOTE — Progress Notes (Signed)
 The test results show that your current treatment is OK, as the tests are stable.  Please continue the same plan.  There is no other need for change of treatment or further evaluation based on these results, at this time.  thanks

## 2023-12-04 ENCOUNTER — Telehealth: Payer: Self-pay | Admitting: Pediatrics

## 2023-12-04 NOTE — Telephone Encounter (Signed)
 Inbound call from patient requesting for prep medication to be sent to [facility] pharmacy on [address] for 7/3 procedures. Please advise, thank you

## 2023-12-05 MED ORDER — NA SULFATE-K SULFATE-MG SULF 17.5-3.13-1.6 GM/177ML PO SOLN
1.0000 | Freq: Once | ORAL | 0 refills | Status: AC
Start: 2023-12-05 — End: 2023-12-05

## 2023-12-05 NOTE — Telephone Encounter (Signed)
 Suprep sent to [facility] on [address] per patient request.

## 2023-12-05 NOTE — Telephone Encounter (Signed)
 I spoke to Ms. Lax and I advised her that the Suprep has been sent to her [facility] on [address] as she requested.

## 2023-12-06 ENCOUNTER — Telehealth: Payer: Self-pay | Admitting: Gastroenterology

## 2023-12-06 NOTE — Telephone Encounter (Signed)
 Inbound call from patient requesting to speak with a nurse in regards to prep medication that was sent into pharmacy. States it is a medication that could affect her heart. Please advise.   Thank you

## 2023-12-06 NOTE — Telephone Encounter (Signed)
 Procedure:Colonoscopy/Endoscopy Procedure date: 12/14/23 Procedure location: [facility] Arrival Time: 8:00 am Spoke with the patient Y/N:   No, 12/06/23 @ 11:37 am, no message left due to the mailbox being full. Mychart message sent for patient to return call.  Any prep concerns? ___  Has the patient obtained the prep from the pharmacy ? ___ Do you have a care partner and transportation: ___ Any additional concerns? ___

## 2023-12-07 ENCOUNTER — Encounter (HOSPITAL_COMMUNITY): Payer: Self-pay | Admitting: Cardiology

## 2023-12-07 NOTE — Telephone Encounter (Signed)
 I spoke to Renee [name] and I advised her that we received the message and I changed her prep and prep instructions.  I explained that Dr. Suzann recommended that she do a 2 day Miralax prep and I told her what she needed to purchase.  She said that she was at the cardiologist office and they are going to print the instructions for her.  I told her that they are in MyChart but I was also mailing a copy to her.  She said that she may come by today so that I can review with her.  I gave her my name again so that she can ask for me.

## 2023-12-07 NOTE — Telephone Encounter (Signed)
 Dr. Suzann, please see message from patient. She has a 2-day prep with Suprep and Miralax. Please advise. Thanks

## 2023-12-08 ENCOUNTER — Encounter (HOSPITAL_COMMUNITY): Payer: Self-pay | Admitting: Pediatrics

## 2023-12-08 ENCOUNTER — Encounter: Payer: Self-pay | Admitting: Internal Medicine

## 2023-12-08 ENCOUNTER — Ambulatory Visit: Attending: Cardiology

## 2023-12-08 DIAGNOSIS — Z01810 Encounter for preprocedural cardiovascular examination: Secondary | ICD-10-CM

## 2023-12-08 NOTE — Progress Notes (Signed)
 PERIOPERATIVE PRESCRIPTION FOR IMPLANTED CARDIAC DEVICE PROGRAMMING  Patient Information: Name:  Renee [name]  DOB:  Jun 30, 1945  MRN:  [id number]  Request for surgical clearance:     Endoscopy Procedure  What type of surgery is being performed?     Endoscopy and Colonoscopy  When is this surgery scheduled?     12/14/23  Practice name and name of physician performing surgery?      [facility]  What is your office phone and fax number?      Phone- [phone]  Fax- [phone]  Anesthesia type (None, local, MAC, general) ?       MAC   Device Information:  Clinic EP Physician:  Danelle Birmingham, MD   Device Type:  Pacemaker Manufacturer and Phone #:  [employer]: [phone] Pacemaker Dependent?:  No. Date of Last Device Check:  11/07/2023 Normal Device Function?:  Yes.    Electrophysiologist's Recommendations:  Have magnet available. Provide continuous ECG monitoring when magnet is used or reprogramming is to be performed.  Procedure should not interfere with device function.  No device programming or magnet placement needed.  Per Device Clinic Standing Orders, [name], RN  4:35 PM 12/08/2023

## 2023-12-08 NOTE — Progress Notes (Signed)
 Virtual Visit via Telephone Note   Because of Renee [name] co-morbid illnesses, she is at least at moderate risk for complications without adequate follow up.  This format is felt to be most appropriate for this patient at this time.  Due to technical limitations with video connection (technology), today's appointment will be conducted as an audio only telehealth visit, and Renee [name] verbally agreed to proceed in this manner.   All issues noted in this document were discussed and addressed.  No physical exam could be performed with this format.  Evaluation Performed:  Preoperative cardiovascular risk assessment _____________   Date:  12/08/2023   Patient ID:  Renee [name], DOB [birth year], MRN [id number] Patient Location:  Home Provider location:   Office  Primary Care Provider:  Norleen Lynwood ORN, MD Primary Cardiologist:  None  Chief Complaint / Patient Profile  78 y.o. y/o female with a h/o AVNRT ablation, NSVT, chronic chest pain syndrome with last coronary CTA in 01/2022 being normal, CHB s/p PPM, PAF not on anticoagulation, prolonged QT syndrome, chronic diastolic CHF who is pending endoscopy and colonoscopy and presents today for telephonic preoperative cardiovascular risk assessment. History of Present Illness  Renee [name] is a 78 y.o. female who presents via audio/video conferencing for a telehealth visit today.  Pt was last seen in cardiology clinic on 10/30/2023 by Dr. Rolan.  At that time Renee [name] was doing well, she was noted to have chronic exertional fatigue however was not volume overloaded on exam.  She had an echo completed that showed EF 55 to 60%.  The patient is now pending procedure as outlined above. Since her last visit, she was evaluated in the emergency department for right shoulder pain, chest pain and headache, noted to have right middle thoracic spine muscular tenderness to palpation, high-sensitivity troponins were negative x 2, BNP was normal, EKG  without any ischemic changes.  Discomfort was felt to be musculoskeletal in nature as it was reproduced on palpation, patient today notes pain resolved.  Today she reports that she is doing well, she denies any further chest pain, tightness or pressure, she denies chest pain with exertion. She continues to note stable, chronic dyspnea on exertion that is unchanged. She denies any changes since last seen in clinic by Dr. Rolan. Today she denies chest pain, lower extremity edema, palpitations, melena, hematuria, hemoptysis, diaphoresis, presyncope, syncope, orthopnea, and PND. She is able to achieve greater than 4 METs of activity with ADLs, walking, and completing chores around the house.  Past Medical History    Past Medical History:  Diagnosis Date   Allergy     tape, electrodes   Anxiety    AV nodal re-entry tachycardia [facility])    a. s/p RFA 1993- Dr. Katina, [facility]   Broken neck [facility])    1993   Cervical cancer [facility])    hx of cervical   ca   CHF (congestive heart failure) (HCC)    Chronic chest pain    a. 2005 Cath: Mild nonobstructive plaque (30-40% LCX);  b. 08/2011 low risk myoview ;  c. 09/2011 Coronary CT: NL cors w ca score of 0.   Chronic pain syndrome    Colon polyps    Complication of anesthesia    blood pressure and heart dropped in 2009     Concussion    1979, 1993   Degenerative joint disease    a. s/p L Total Knee Arthroplasty.   Diet-controlled type 2 diabetes mellitus (HCC)  pt. denies   Diverticulosis of colon    Duodenitis without hemorrhage    Dysrhythmia    REPORTS , SINCE PLACEMENT OF PACEMAKER, STILL CAN FEEL HEART  GOING IN AND OUT OF [facility] REPORTS, YESTERDAY HER BP SYSTOLIC WAS IN THE 200s AND WAS BREATHLESS, HR 112, SWEATING ; DENIES SYNCOPE  , DENIES CHEST PAIN    Fibromyalgia    GERD (gastroesophageal reflux disease)    Hemorrhoids    Hypertension    Irritable bowel syndrome    Long QT interval    a. mild - advised to avoid meds that may prolong QT.    Myocardial infarction [facility]) 09/1991   does not know when second heart attack was   Neuropathy    Osteoporosis    Paroxysmal A-fib (HCC)    PER PATIENT. I CAN FEEL MY HEART RACING WHEN IM IN IT    Recurrent upper respiratory infection (URI)    SVT (supraventricular tachycardia) (HCC)    a. nonsustained SVT - previously offered flecainide  but refused;  b. 12/2012 30 day event monitor w/o significant arrhythmias.   Thyroid  nodule    Past Surgical History:  Procedure Laterality Date   ABLATION OF DYSRHYTHMIC FOCUS  1993   ANTERIOR AND POSTERIOR REPAIR N/A 08/01/2017   Procedure: ANTERIOR (CYSTOCELE) AND POSTERIOR REPAIR (RECTOCELE);  Surgeon: Gaston Hamilton, MD;  Location: [facility];  Service: Urology;  Laterality: N/A;   APPENDECTOMY     1980   BILATERAL SALPINGECTOMY Left 08/01/2017   Procedure: SALPINGECTOMY;  Surgeon: Sarrah Browning, MD;  Location: [facility];  Service: Gynecology;  Laterality: Left;   BIOPSY THYROID      CARDIAC ELECTROPHYSIOLOGY STUDY AND ABLATION     atrioventricular nodal reentant tachycardia   CHOLECYSTECTOMY  1980   COLONOSCOPY  06/25/2008   Diverticulosis and Hemorrhoids   COLPOSCOPY     CORONARY ANGIOPLASTY  2007   CYSTOSCOPY N/A 08/01/2017   Procedure: CYSTOSCOPY;  Surgeon: Gaston Hamilton, MD;  Location: [facility];  Service: Urology;  Laterality: N/A;   EP IMPLANTABLE DEVICE N/A 03/03/2016   Procedure: Pacemaker Implant;  Surgeon: Danelle LELON Birmingham, MD;  Location: [facility] INVASIVE CV LAB;  Service: Cardiovascular;  Laterality: N/A;   ESOPHAGOGASTRODUODENOSCOPY     HYSTEROSCOPY  2002   with resection of endometrial polyps. by Dr.MCPhail   HYSTEROSCOPY WITH D & C N/A 02/05/2015   Procedure: DILATATION AND CURETTAGE /HYSTEROSCOPY;  Surgeon: Marjorie Gull, MD;  Location: [facility];  Service: Gynecology;  Laterality: N/A;   LOOP RECORDER IMPLANT  08/21/2013   MDT LinQ implanted by Dr Birmingham for syncope   LOOP RECORDER IMPLANT N/A 08/21/2013   Procedure: LOOP RECORDER IMPLANT;   Surgeon: Danelle LELON Birmingham, MD;  Location: [facility] CATH LAB;  Service: Cardiovascular;  Laterality: N/A;   ORIF RADIAL HEAD / NECK FRACTURE  09/05/88   TILT TABLE STUDY N/A 05/15/2013   Procedure: TILT TABLE STUDY;  Surgeon: Elspeth JAYSON Sage, MD;  Location: [facility] CATH LAB;  Service: Cardiovascular;  Laterality: N/A;   TOTAL HIP ARTHROPLASTY  10/10   Left, at [facility]   UPPER GASTROINTESTINAL ENDOSCOPY     VAGINAL HYSTERECTOMY N/A 08/01/2017   Procedure: HYSTERECTOMY VAGINAL;  Surgeon: Sarrah Browning, MD;  Location: [facility];  Service: Gynecology;  Laterality: N/A;   Allergies Allergies  Allergen Reactions   Cyclobenzaprine Other (See Comments)    Pt states that this medication does not work for her.     Azithromycin  Other (See Comments)    Cannot take due to  prolonged QT   Nitrofurantoin Other (See Comments)   Adhesive [Tape] Itching, Rash and Other (See Comments)    Reaction:  Blisters  Pt states that she is allergic to the adhesive backing of EKG pads if left on for more than 24 hours Patient states she tolerates paper tape ok   Moxifloxacin Other (See Comments)    Unknown allergic reaction many years ago   Quinolones Other (See Comments)    Unknown allergic reaction many years ago   Home Medications    Prior to Admission medications   Medication Sig Start Date End Date Taking? Authorizing Provider  ACCU-CHEK FASTCLIX LANCETS MISC USE UP TO 4 TIMES DAILY AS  DIRECTED 04/11/18   Norleen Lynwood ORN, MD  ACCU-CHEK GUIDE test strip USE UP TO 4 TIMES DAILY AS  DIRECTED 04/11/18   Norleen Lynwood ORN, MD  ALPRAZolam  (XANAX ) 0.5 MG tablet Take 0.5-1 tablets (0.25-0.5 mg total) by mouth daily as needed for anxiety. 03/23/22   Norleen Lynwood ORN, MD  aspirin  EC 81 MG tablet Take 81 mg by mouth daily.    [provider]  blood glucose meter kit and supplies KIT Dispense per patient and insurance preferred. Use up to four times daily as directed. E11.9 02/21/18   Norleen Lynwood ORN, MD  cetirizine (ZYRTEC) 10  MG tablet Take 10 mg by mouth daily as needed.    [provider]  diltiazem  (CARDIZEM  CD) 360 MG 24 hr capsule Take 1 capsule (360 mg total) by mouth every morning. 06/16/23   Rolan Ezra RAMAN, MD  diltiazem  (CARDIZEM ) 30 MG tablet Take 2 tablets (60 mg total) by mouth 4 (four) times daily as needed (as needed for breakthrough heart racing/palpitations). 08/26/22   Ursuy, Renee Lynn, PA-C  flecainide  (TAMBOCOR ) 50 MG tablet TAKE 1 TABLET BY MOUTH TWICE  DAILY 07/25/23   Waddell Danelle ORN, MD  furosemide  (LASIX ) 20 MG tablet Take 1 tablet (20 mg total) by mouth daily as needed for fluid. 10/23/23   Rolan Ezra RAMAN, MD  losartan  (COZAAR ) 50 MG tablet Take 1.5 tablets (75 mg total) by mouth 2 (two) times daily. 10/30/23   Rolan Ezra RAMAN, MD  nitroGLYCERIN  (NITROSTAT ) 0.4 MG SL tablet DISSOLVE 1 TABLET UNDER THE  TONGUE EVERY 5 MINUTES AS NEEDED FOR CHEST PAIN. MAX OF 3 TABLETS IN 15 MINUTES. CALL 911 IF PAIN  PERSISTS. 08/01/22   Rolan Ezra RAMAN, MD  omeprazole  (PRILOSEC) 40 MG capsule TAKE 1 CAPSULE BY MOUTH IN  THE MORNING AND AT BEDTIME 11/15/21   Aneita Gwendlyn DASEN, MD  trimethoprim  (TRIMPEX ) 100 MG tablet Take 100 mg by mouth daily as needed (For UTI per patient). 06/24/20   [provider]  zolpidem  (AMBIEN ) 5 MG tablet TAKE 1 TABLET BY MOUTH AT  BEDTIME AS NEEDED FOR SLEEP 12/22/20   Norleen Lynwood ORN, MD   Physical Exam  Vital Signs:  Renee [name] does not have vital signs available for review today. Given telephonic nature of communication, physical exam is limited. AAOx3. NAD. Normal affect.  Speech and respirations are unlabored. Accessory Clinical Findings  None Assessment & Plan    1.  Preoperative Cardiovascular Risk Assessment: Renee [name] perioperative risk of a major cardiac event is 6.6% according to the Revised Cardiac Risk Index (RCRI). Her functional capacity is fair at 5.07 METs according to the [facility] Activity Status Index (DASI).  Given patient's history she will have  endoscopy and colonoscopy at [facility] long. Recommendations: According to ACC/AHA guidelines, no  further cardiovascular testing needed.  The patient may proceed to surgery at acceptable risk.   Antiplatelet and/or Anticoagulation Recommendations: None requested.    The patient was advised that if she develops new symptoms prior to surgery to contact our office to arrange for a follow-up visit, and she verbalized understanding.  A copy of this note will be routed to requesting surgeon.  Time:   Today, I have spent 15 minutes with the patient with telehealth technology discussing medical history, symptoms, and management plan.    Analysa Nutting D Janne Faulk, NP  12/08/2023, 3:54 PM

## 2023-12-12 ENCOUNTER — Telehealth: Payer: Self-pay | Admitting: Pediatrics

## 2023-12-12 ENCOUNTER — Encounter (HOSPITAL_COMMUNITY): Payer: Self-pay | Admitting: Cardiology

## 2023-12-12 NOTE — Telephone Encounter (Signed)
 Inbound call from patient stating she started taking prep today but has vomited it all. Patient is unsure how to proceed. Requesting a call back. Please advise, thank you

## 2023-12-14 ENCOUNTER — Ambulatory Visit (HOSPITAL_COMMUNITY): Admitting: Anesthesiology

## 2023-12-14 ENCOUNTER — Encounter (HOSPITAL_COMMUNITY)
Admission: RE | Disposition: A | Discharge status: Home / Self Care | Payer: Self-pay | Source: Home / Self Care | Attending: Pediatrics

## 2023-12-14 ENCOUNTER — Ambulatory Visit (HOSPITAL_COMMUNITY)
Admission: RE | Admit: 2023-12-14 | Discharge: 2023-12-14 | Disposition: A | Discharge status: Home / Self Care | Attending: Pediatrics | Admitting: Pediatrics

## 2023-12-14 ENCOUNTER — Encounter (HOSPITAL_COMMUNITY): Payer: Self-pay | Admitting: Pediatrics

## 2023-12-14 ENCOUNTER — Other Ambulatory Visit: Payer: Self-pay

## 2023-12-14 ENCOUNTER — Other Ambulatory Visit (HOSPITAL_COMMUNITY): Payer: Self-pay | Admitting: Cardiology

## 2023-12-14 DIAGNOSIS — K2950 Unspecified chronic gastritis without bleeding: Secondary | ICD-10-CM

## 2023-12-14 DIAGNOSIS — Z860101 Personal history of adenomatous and serrated colon polyps: Secondary | ICD-10-CM | POA: Diagnosis not present

## 2023-12-14 DIAGNOSIS — R197 Diarrhea, unspecified: Secondary | ICD-10-CM

## 2023-12-14 DIAGNOSIS — R194 Change in bowel habit: Secondary | ICD-10-CM | POA: Diagnosis present

## 2023-12-14 DIAGNOSIS — K219 Gastro-esophageal reflux disease without esophagitis: Secondary | ICD-10-CM

## 2023-12-14 DIAGNOSIS — R159 Full incontinence of feces: Secondary | ICD-10-CM

## 2023-12-14 DIAGNOSIS — R195 Other fecal abnormalities: Secondary | ICD-10-CM | POA: Diagnosis present

## 2023-12-14 DIAGNOSIS — K31A0 Gastric intestinal metaplasia, unspecified: Secondary | ICD-10-CM

## 2023-12-14 DIAGNOSIS — R109 Unspecified abdominal pain: Secondary | ICD-10-CM | POA: Diagnosis not present

## 2023-12-14 DIAGNOSIS — K635 Polyp of colon: Secondary | ICD-10-CM

## 2023-12-14 DIAGNOSIS — K449 Diaphragmatic hernia without obstruction or gangrene: Secondary | ICD-10-CM

## 2023-12-14 DIAGNOSIS — K648 Other hemorrhoids: Secondary | ICD-10-CM

## 2023-12-14 DIAGNOSIS — K2100 Gastro-esophageal reflux disease with esophagitis, without bleeding: Secondary | ICD-10-CM | POA: Diagnosis not present

## 2023-12-14 DIAGNOSIS — D123 Benign neoplasm of transverse colon: Secondary | ICD-10-CM | POA: Diagnosis not present

## 2023-12-14 DIAGNOSIS — I2510 Atherosclerotic heart disease of native coronary artery without angina pectoris: Secondary | ICD-10-CM | POA: Diagnosis not present

## 2023-12-14 DIAGNOSIS — D120 Benign neoplasm of cecum: Secondary | ICD-10-CM | POA: Diagnosis not present

## 2023-12-14 DIAGNOSIS — I110 Hypertensive heart disease with heart failure: Secondary | ICD-10-CM | POA: Diagnosis not present

## 2023-12-14 DIAGNOSIS — I509 Heart failure, unspecified: Secondary | ICD-10-CM

## 2023-12-14 DIAGNOSIS — Z09 Encounter for follow-up examination after completed treatment for conditions other than malignant neoplasm: Secondary | ICD-10-CM | POA: Insufficient documentation

## 2023-12-14 DIAGNOSIS — K5900 Constipation, unspecified: Secondary | ICD-10-CM | POA: Diagnosis present

## 2023-12-14 HISTORY — PX: ESOPHAGOGASTRODUODENOSCOPY: SHX5428

## 2023-12-14 HISTORY — PX: COLONOSCOPY: SHX5424

## 2023-12-14 SURGERY — COLONOSCOPY
Anesthesia: Monitor Anesthesia Care

## 2023-12-14 MED ORDER — SODIUM CHLORIDE 0.9% FLUSH: 3.0000 mL | Freq: Two times a day (BID) | INTRAVENOUS | Status: DC

## 2023-12-14 MED ORDER — PROPOFOL 10 MG/ML IV BOLUS
INTRAVENOUS | Status: AC
  Filled 2023-12-14: qty 20

## 2023-12-14 MED ORDER — PROPOFOL 1000 MG/100ML IV EMUL
INTRAVENOUS | Status: AC
  Filled 2023-12-14: qty 100

## 2023-12-14 MED ORDER — PROPOFOL 500 MG/50ML IV EMUL
INTRAVENOUS | Status: DC | PRN
  Administered 2023-12-14: 50 mg via INTRAVENOUS
  Administered 2023-12-14: 100 ug/kg/min via INTRAVENOUS
  Administered 2023-12-14 (×3): 50 mg via INTRAVENOUS

## 2023-12-14 MED ORDER — SODIUM CHLORIDE 0.9 % IV SOLN: INTRAVENOUS | Status: DC | PRN

## 2023-12-14 MED ORDER — SODIUM CHLORIDE 0.9% FLUSH: 3.0000 mL | INTRAVENOUS | Status: DC | PRN

## 2023-12-14 MED ORDER — PHENYLEPHRINE HCL (PRESSORS) 10 MG/ML IV SOLN
INTRAVENOUS | Status: DC | PRN
  Administered 2023-12-14: 80 ug via INTRAVENOUS
  Administered 2023-12-14: 160 ug via INTRAVENOUS

## 2023-12-14 NOTE — Telephone Encounter (Signed)
 Chart reviewed and patient was able to have her procedure.

## 2023-12-14 NOTE — Anesthesia Postprocedure Evaluation (Signed)
 Anesthesia Post Note  Patient: Renee [name]  Procedure(s) Performed: COLONOSCOPY EGD (ESOPHAGOGASTRODUODENOSCOPY)     Patient location during evaluation: PACU Anesthesia Type: MAC Level of consciousness: awake and alert Pain management: pain level controlled Vital Signs Assessment: post-procedure vital signs reviewed and stable Respiratory status: spontaneous breathing, nonlabored ventilation, respiratory function stable and patient connected to nasal cannula oxygen Cardiovascular status: stable and blood pressure returned to baseline Postop Assessment: no apparent nausea or vomiting Anesthetic complications: no   No notable events documented.  Last Vitals:  Vitals:   12/14/23 1020 12/14/23 1030  BP: (!) 117/97 139/71  Pulse: 61 67  Resp: 17 20  Temp:    SpO2: 97% 98%    Last Pain:  Vitals:   12/14/23 1030  TempSrc:   PainSc: 0-No pain                 Epifanio Lamar BRAVO

## 2023-12-14 NOTE — Anesthesia Procedure Notes (Signed)
 Procedure Name: MAC Date/Time: 12/14/2023 9:10 AM  Performed by: Nada Corean CROME, CRNAPre-anesthesia Checklist: Patient identified, Emergency Drugs available, Suction available, Patient being monitored and Timeout performed Patient Re-evaluated:Patient Re-evaluated prior to induction Oxygen Delivery Method: Simple face mask Preoxygenation: Pre-oxygenation with 100% oxygen Induction Type: IV induction Placement Confirmation: positive ETCO2 Comments: POM mask used

## 2023-12-14 NOTE — Transfer of Care (Signed)
 Immediate Anesthesia Transfer of Care Note  Patient: Renee [name]  Procedure(s) Performed: COLONOSCOPY EGD (ESOPHAGOGASTRODUODENOSCOPY)  Patient Location: PACU and Endoscopy Unit  Anesthesia Type:MAC  Level of Consciousness: awake, alert , oriented, and patient cooperative  Airway & Oxygen Therapy: Patient Spontanous Breathing  Post-op Assessment: Report given to RN and Post -op Vital signs reviewed and stable  Post vital signs: Reviewed and stable  Last Vitals:  Vitals Value Taken Time  BP 114/52 12/14/23 10:07  Temp    Pulse 71 12/14/23 10:09  Resp 23 12/14/23 10:09  SpO2 99 % 12/14/23 10:09  Vitals shown include unfiled device data.  Last Pain:  Vitals:   12/14/23 0825  TempSrc: Temporal  PainSc: 0-No pain         Complications: No notable events documented.

## 2023-12-14 NOTE — Op Note (Signed)
 [facility] Patient Name: Renee [name] Procedure Date: 12/14/2023 MRN: [id number] Attending MD: Inocente Hausen , MD, [id number] Date of Birth: [birth year] CSN: [id number] Age: 78 Admit Type: Outpatient Procedure:                Colonoscopy Indications:              Last colonoscopy: 2020, Abdominal pain, Diarrhea,                            Follow-up for history of adenomatous polyps in the                            colon, Change in bowel habits, Change in stool                            caliber, Intermittent symptoms of obstructive                            defecation Providers:                Inocente Hausen, MD, Darleene Bare, RN, Haskel Chris, Technician Referring MD:              Medicines:                Monitored Anesthesia Care Complications:            No immediate complications. Estimated blood loss:                            Minimal. Estimated Blood Loss:     Estimated blood loss was minimal. Procedure:                Pre-Anesthesia Assessment:                           - Prior to the procedure, a History and Physical                            was performed, and patient medications and                            allergies were reviewed. The patient's tolerance of                            previous anesthesia was also reviewed. The risks                            and benefits of the procedure and the sedation                            options and risks were discussed with the patient.                            All questions were answered, and informed consent  was obtained. Prior Anticoagulants: The patient has                            taken no anticoagulant or antiplatelet agents                            except for aspirin . ASA Grade Assessment: III - A                            patient with severe systemic disease. After                            reviewing the risks and benefits, the patient  was                            deemed in satisfactory condition to undergo the                            procedure.                           After obtaining informed consent, the colonoscope                            was passed under direct vision. Throughout the                            procedure, the patient's blood pressure, pulse, and                            oxygen saturations were monitored continuously. The                            CF-HQ190L ([id number]) Olympus colonoscope was                            introduced through the anus and advanced to the                            terminal ileum. The colonoscopy was performed                            without difficulty. The patient tolerated the                            procedure well. The quality of the bowel                            preparation was good. The terminal ileum, ileocecal                            valve, appendiceal orifice, and rectum were  photographed. Scope In: 9:38:38 AM Scope Out: 9:59:11 AM Scope Withdrawal Time: 0 hours 17 minutes 3 seconds  Total Procedure Duration: 0 hours 20 minutes 33 seconds  Findings:      Hemorrhoids were found on perianal exam.      The digital rectal exam was normal. Pertinent negatives include normal       sphincter tone and no palpable rectal lesions.      A 3 mm polyp was found in the cecum. The polyp was sessile. The polyp       was removed with a cold biopsy forceps. Resection and retrieval were       complete.      Two sessile polyps were found in the transverse colon. The polyps were 5       mm in size. These polyps were removed with a cold snare. Resection and       retrieval were complete.      The terminal ileum appeared normal.      Normal mucosa was found in the entire colon. Biopsies for histology were       taken with a cold forceps for evaluation of microscopic colitis.      Internal hemorrhoids were found during  retroflexion. Impression:               - Hemorrhoids found on perianal exam.                           - One 3 mm polyp in the cecum, removed with a cold                            biopsy forceps. Resected and retrieved.                           - Two 5 mm polyps in the transverse colon, removed                            with a cold snare. Resected and retrieved.                           - The examined portion of the ileum was normal.                           - Normal mucosa in the entire examined colon.                            Biopsied. Evaluate for microscopic colitis.                           - Internal hemorrhoids. Moderate Sedation:      Not Applicable - Patient had care per Anesthesia. Recommendation:           - Discharge patient to home (ambulatory).                           - Await pathology results.                           - The findings and recommendations were discussed  with the patient's family.                           - Patient has a contact number available for                            emergencies. The signs and symptoms of potential                            delayed complications were discussed with the                            patient. Return to normal activities tomorrow.                            Written discharge instructions were provided to the                            patient. Procedure Code(s):        --- Professional ---                           [phone], Colonoscopy, flexible; with removal of                            tumor(s), polyp(s), or other lesion(s) by snare                            technique                           45380, 59, Colonoscopy, flexible; with biopsy,                            single or multiple Diagnosis Code(s):        --- Professional ---                           D12.0, Benign neoplasm of cecum                           D12.3, Benign neoplasm of transverse colon (hepatic                             flexure or splenic flexure)                           K64.8, Other hemorrhoids                           R10.9, Unspecified abdominal pain                           R19.7, Diarrhea, unspecified                           Z86.010, Personal history of colonic polyps  R19.4, Change in bowel habit                           R19.5, Other fecal abnormalities CPT copyright 2022 American Medical Association. All rights reserved. The codes documented in this report are preliminary and upon coder review may  be revised to meet current compliance requirements. Inocente Hausen, MD 12/14/2023 10:11:23 AM This report has been signed electronically. Number of Addenda: 0

## 2023-12-14 NOTE — Op Note (Signed)
 [facility] Patient Name: Renee [name] Procedure Date: 12/14/2023 MRN: [id number] Attending MD: Inocente Hausen , MD, [id number] Date of Birth: 03-05-1946 CSN: [id number] Age: 78 Admit Type: Outpatient Procedure:                Upper GI endoscopy Indications:              Abdominal pain, Follow-up of gastro-esophageal                            reflux disease, Diarrhe, History of intestinal                            metaplasia on previous gastric biopsies Providers:                Inocente Hausen, MD, Darleene Bare, RN, Haskel Chris, Technician Referring MD:              Medicines:                Monitored Anesthesia Care Complications:            No immediate complications. Estimated blood loss:                            Minimal. Estimated Blood Loss:     Estimated blood loss was minimal. Procedure:                Pre-Anesthesia Assessment:                           - Prior to the procedure, a History and Physical                            was performed, and patient medications and                            allergies were reviewed. The patient's tolerance of                            previous anesthesia was also reviewed. The risks                            and benefits of the procedure and the sedation                            options and risks were discussed with the patient.                            All questions were answered, and informed consent                            was obtained. Prior Anticoagulants: The patient has                            taken no anticoagulant  or antiplatelet agents                            except for aspirin . ASA Grade Assessment: III - A                            patient with severe systemic disease. After                            reviewing the risks and benefits, the patient was                            deemed in satisfactory condition to undergo the                            procedure.                            After obtaining informed consent, the endoscope was                            passed under direct vision. Throughout the                            procedure, the patient's blood pressure, pulse, and                            oxygen saturations were monitored continuously. The                            [id number]-H190 ([id number]) Olympus endoscope was introduced                            through the mouth, and advanced to the second part                            of duodenum. The upper GI endoscopy was                            accomplished without difficulty. The patient                            tolerated the procedure well. Scope In: Scope Out: Findings:      The examined esophagus was normal.      The gastric body, gastric antrum, cardia (on retroflexion) and gastric       fundus (on retroflexion) were normal. Biopsies were obtained on the       greater curvature of the gastric body, on the lesser curvature of the       gastric body, at the incisura, on the greater curvature of the gastric       antrum and on the lesser curvature of the gastric antrum with cold       forceps for histology and gastric intestinal metaplasia mapping and H.       pylori.      A small hiatal  hernia was present.      The duodenal bulb and second portion of the duodenum were normal.       Biopsies for histology were taken with a cold forceps for evaluation of       celiac disease. Impression:               - Normal esophagus.                           - Normal gastric body, antrum, cardia and gastric                            fundus.                           - Small hiatal hernia.                           - Normal duodenal bulb and second portion of the                            duodenum. Biopsied.                           - Biopsies were obtained on the greater curvature                            of the gastric body, on the lesser curvature of the                             gastric body, at the incisura, on the greater                            curvature of the gastric antrum and on the lesser                            curvature of the gastric antrum. Moderate Sedation:      Not Applicable - Patient had care per Anesthesia. Recommendation:           - Await pathology results.                           - Perform a colonoscopy today.                           - The findings and recommendations were discussed                            with the patient. Procedure Code(s):        --- Professional ---                           [id number], Esophagogastroduodenoscopy, flexible,                            transoral; with biopsy, single or multiple Diagnosis Code(s):        ---  Professional ---                           K44.9, Diaphragmatic hernia without obstruction or                            gangrene                           R10.9, Unspecified abdominal pain                           K21.9, Gastro-esophageal reflux disease without                            esophagitis                           R19.7, Diarrhea, unspecified CPT copyright 2022 American Medical Association. All rights reserved. The codes documented in this report are preliminary and upon coder review may  be revised to meet current compliance requirements. Inocente Hausen, MD 12/14/2023 10:05:39 AM This report has been signed electronically. Number of Addenda: 0
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
## 2023-12-14 NOTE — Anesthesia Preprocedure Evaluation (Signed)
 Anesthesia Evaluation  Patient identified by MRN, date of birth, ID band Patient awake    Reviewed: Allergy  & Precautions, NPO status , Patient's Chart, lab work & pertinent test results  Airway Mallampati: II  TM Distance: >3 FB Neck ROM: Full    Dental  (+) Dental Advisory Given   Pulmonary    breath sounds clear to auscultation       Cardiovascular hypertension, Pt. on medications and Pt. on home beta blockers + CAD and +CHF  + dysrhythmias (AVNRT, Long QT) Atrial Fibrillation and Supra Ventricular Tachycardia + pacemaker  Rhythm:Regular Rate:Normal     Neuro/Psych  Headaches    GI/Hepatic Neg liver ROS,GERD  ,,  Endo/Other  diabetes, Type 2    Renal/GU negative Renal ROS     Musculoskeletal  (+) Arthritis ,    Abdominal   Peds  Hematology negative hematology ROS (+)   Anesthesia Other Findings   Reproductive/Obstetrics                              Anesthesia Physical Anesthesia Plan  ASA: 3  Anesthesia Plan: MAC   Post-op Pain Management:    Induction:   PONV Risk Score and Plan: 2 and Propofol  infusion  Airway Management Planned: Natural Airway, Simple Face Mask and Nasal Cannula  Additional Equipment:   Intra-op Plan:   Post-operative Plan:   Informed Consent: I have reviewed the patients History and Physical, chart, labs and discussed the procedure including the risks, benefits and alternatives for the proposed anesthesia with the patient or authorized representative who has indicated his/her understanding and acceptance.       Plan Discussed with:   Anesthesia Plan Comments:         Anesthesia Quick Evaluation

## 2023-12-14 NOTE — Discharge Instructions (Signed)
YOU HAD AN ENDOSCOPIC PROCEDURE TODAY: Refer to the procedure report and other information in the discharge instructions given to you for any specific questions about what was found during the examination. If this information does not answer your questions, please call [place] office at [phone] to clarify.   YOU SHOULD EXPECT: Some feelings of bloating in the abdomen. Passage of more gas than usual. Walking can help get rid of the air that was put into your GI tract during the procedure and reduce the bloating. If you had a lower endoscopy (such as a colonoscopy or flexible sigmoidoscopy) you may notice spotting of blood in your stool or on the toilet paper. Some abdominal soreness may be present for a day or two, also.  DIET: Your first meal following the procedure should be a light meal and then it is ok to progress to your normal diet. A half-sandwich or bowl of soup is an example of a good first meal. Heavy or fried foods are harder to digest and may make you feel nauseous or bloated. Drink plenty of fluids but you should avoid alcoholic beverages for 24 hours.   ACTIVITY: Your care partner should take you home directly after the procedure. You should plan to take it easy, moving slowly for the rest of the day. You can resume normal activity the day after the procedure however YOU SHOULD NOT DRIVE, use power tools, machinery or perform tasks that involve climbing or major physical exertion for 24 hours (because of the sedation medicines used during the test).   SYMPTOMS TO REPORT IMMEDIATELY: A gastroenterologist can be reached at any hour. Please call [phone]  for any of the following symptoms:  Following lower endoscopy (colonoscopy, flexible sigmoidoscopy) Excessive amounts of blood in the stool  Significant tenderness, worsening of abdominal pains  Swelling of the abdomen that is new, acute  Fever of 100 or higher  Following upper endoscopy (EGD, EUS, ERCP, esophageal  dilation) Vomiting of blood or coffee ground material  New, significant abdominal pain  New, significant chest pain or pain under the shoulder blades  Painful or persistently difficult swallowing  New shortness of breath  Black, tarry-looking or red, bloody stools  FOLLOW UP:  If any biopsies were taken you will be contacted by phone or by letter within the next 1-3 weeks. Call [phone]  if you have not heard about the biopsies in 3 weeks.  Please also call with any specific questions about appointments or follow up tests.

## 2023-12-16 ENCOUNTER — Encounter (HOSPITAL_COMMUNITY): Payer: Self-pay | Admitting: Pediatrics

## 2023-12-18 ENCOUNTER — Ambulatory Visit: Payer: Self-pay | Admitting: Pediatrics

## 2023-12-18 LAB — SURGICAL PATHOLOGY

## 2023-12-21 ENCOUNTER — Other Ambulatory Visit (HOSPITAL_COMMUNITY): Payer: Self-pay

## 2023-12-21 ENCOUNTER — Telehealth: Payer: Self-pay | Admitting: Pediatrics

## 2023-12-21 MED ORDER — OMEPRAZOLE 40 MG PO CPDR: 40.0000 mg | DELAYED_RELEASE_CAPSULE | Freq: Two times a day (BID) | ORAL | 3 refills | Status: DC

## 2023-12-21 NOTE — Telephone Encounter (Signed)
 I spoke to [place] and I advised her that her refill had been sent.  She was concerned that because we didn't call it in, [facility] wouldn't expedite the request.  I told her that the message was delayed incorrectly and it was understood that she needed to have a peer to peer call to get the medication authorized but after checking, a new Rx was sent to her pharmacy.

## 2023-12-21 NOTE — Telephone Encounter (Signed)
 Dr. Suzann, Renee [name] stated that she has taken dicyclomine  a long time before and it made her sick.  She said that she knows that this is information that you were not aware of.  She wanted to make sure that you want her to try it or something else.

## 2023-12-21 NOTE — Telephone Encounter (Signed)
 Inbound call from patient stated she needs a prescription for Omeprazole  medication and hasn't been able to receive it. Patient also states that she's been having to pay out of pocket for medication due to not having the prescription. States that the quickest way would be peer to peer calling [facility] at [phone].   Please advise  Thank you

## 2023-12-21 NOTE — Telephone Encounter (Signed)
 I spoke to [facility] and I confirmed that they received her prescription.  I explained that the patient was concerned that they wouldn't send her medication overnight since we didn't call it in.  [facility] explained that it takes 1-2 days for the patient to receive it but she could have it expedited by sending it to [facility].    I notified the patient.

## 2023-12-21 NOTE — Telephone Encounter (Signed)
 Patient just needs new rx sent to her pharmacy. Last one written was in 2023. I did a test claim and there is no PA required.

## 2023-12-21 NOTE — Telephone Encounter (Signed)
 Refill sent.

## 2023-12-21 NOTE — Addendum Note (Signed)
 Addended by: WILLIEMAE JOLA PARAS on: 12/21/2023 05:05 PM   Modules accepted: Orders

## 2023-12-21 NOTE — Telephone Encounter (Signed)
 I spoke to [place] at [facility] in [place] and advised her that the medication needed to be transferred from [facility].

## 2023-12-22 ENCOUNTER — Telehealth: Payer: Self-pay | Admitting: Pediatrics

## 2023-12-22 ENCOUNTER — Encounter: Payer: Self-pay | Admitting: Pediatrics

## 2023-12-22 MED ORDER — OMEPRAZOLE 40 MG PO CPDR: 40.0000 mg | DELAYED_RELEASE_CAPSULE | Freq: Two times a day (BID) | ORAL | 3 refills | Status: AC

## 2023-12-22 NOTE — Progress Notes (Signed)
 Remote pacemaker transmission.

## 2023-12-22 NOTE — Telephone Encounter (Signed)
 [facility] pharmacy rep called and stated that this patient has has two different instruction for her Omeprazole  and they are needing some clarification on how to take the medication. A good contact number for them is [phone]. Please advise.

## 2023-12-22 NOTE — Addendum Note (Signed)
 Addended by: TAWNI DRILLING D on: 12/22/2023 01:53 PM   Modules accepted: Orders

## 2023-12-22 NOTE — Telephone Encounter (Signed)
 I called [facility] tomake sure that I called them about the ransfer for Omeprazole .  She advised that she did speak with me but remembered after our call that she can't transfer from [facility].  She asked if I could resend the Rx directly to them.  She said that she had called the patient and told her to call us .  I advised her I can send it and I will call the patient and let her know.  She said that the Rx would be ready by 2 pm.  I spoke with Ms. Jimmy and I advised her that I had spoken to [facility] in [place] because I received a call from the [facility] in [place].  I advised her that they explained that they can't request the transfer from [facility] so and new Rx had to be sent.  I told her that I took care of that and the pharmacist said that they will have it ready by 2 pm.

## 2023-12-22 NOTE — Addendum Note (Signed)
 Addended by: WILLIEMAE JOLA PARAS on: 12/22/2023 12:13 PM   Modules accepted: Orders

## 2023-12-25 ENCOUNTER — Encounter: Payer: Self-pay | Admitting: Pediatrics

## 2023-12-25 ENCOUNTER — Encounter (HOSPITAL_COMMUNITY): Payer: Self-pay | Admitting: Cardiology

## 2023-12-26 ENCOUNTER — Encounter (HOSPITAL_COMMUNITY): Payer: Self-pay

## 2023-12-26 NOTE — Progress Notes (Deleted)
 12/26/2023 Renee [name] [id number] 09-30-1945  Referring provider: Norleen Lynwood ORN, MD Primary GI doctor: Dr. Suzann  ASSESSMENT AND PLAN:  Fluctuating bowel habits, abdominal discomfort, fecal incontinence Negative celiac 10/31/2023 CT and pelvis with contrast shows no acute findings, status post cholecystectomy, diverticulosis no diverticulitis. 12/14/2023 colonoscopy good bowel prep, hemorrhoids, 3 mm TA polyp, 5 mm TA polyp, normal TI negative microscopic colitis no screening recall  Pelvic floor dysfunction History of cystocele and rectocele repair 2019 History of urinary incontinence  GERD Status post cholecystectomy 12/14/2023 EGD small HH, normal esophagus normal stomach normal duodenum, negative celiac negative H. pylori negative IM.  Patient Care Team: Norleen Lynwood ORN, MD as PCP - General (Internal Medicine) Rolan Ezra GORMAN, MD as PCP - Advanced Heart Failure (Cardiology) Faythe Purchase, MD as Consulting Physician (Internal Medicine) Mable Lenis, MD (Inactive) as Consulting Physician (Otolaryngology) Jakie Lenis SAUNDERS, MD as Consulting Physician (Gastroenterology) Ishmael Slough, MD as Consulting Physician (Internal Medicine) Okey Arch, MD (Inactive) as Consulting Physician (Obstetrics and Gynecology) Rolan Ezra GORMAN, MD as Consulting Physician (Cardiology) Cleotilde Sewer, OD (Optometry)  HISTORY OF PRESENT ILLNESS: 78 y.o. female with a past medical history listed below presents for evaluation of fecal incontinence.   Patient last seen by Dr. Suzann 10/25/2023 for alternating diarrhea constipation, intermittent lower abdominal pain and fecal incontinence with decreased rectal tone. 10/31/2023 CT and pelvis with contrast shows no acute findings, status post cholecystectomy, diverticulosis no diverticulitis. 12/14/2023 colonoscopy good bowel prep, hemorrhoids, 3 mm TA polyp, 5 mm TA polyp, normal TI negative microscopic colitis no screening recall 12/14/2023 EGD  small HH, normal esophagus normal stomach normal duodenum, negative celiac negative H. pylori negative IM. *** Discussed the use of AI scribe software for clinical note transcription with the patient, who gave verbal consent to proceed.  History of Present Illness            She  reports that she has never smoked. She has never used smokeless tobacco. She reports that she does not currently use alcohol. She reports that she does not use drugs.  RELEVANT GI HISTORY, IMAGING AND LABS: Results          CBC    Component Value Date/Time   WBC 5.5 11/23/2023 1157   RBC 4.88 11/23/2023 1157   HGB 12.6 11/23/2023 1157   HCT 39.3 11/23/2023 1157   PLT 268 11/23/2023 1157   MCV 80.5 11/23/2023 1157   MCH 25.8 (L) 11/23/2023 1157   MCHC 32.1 11/23/2023 1157   RDW 13.6 11/23/2023 1157   LYMPHSABS 1.8 11/22/2023 0947   MONOABS 0.3 11/22/2023 0947   EOSABS 0.1 11/22/2023 0947   BASOSABS 0.0 11/22/2023 0947   Recent Labs    01/13/23 1205 11/22/23 0947 11/23/23 1157  HGB 13.0 12.7 12.6    CMP     Component Value Date/Time   NA 137 11/23/2023 1157   NA 139 02/23/2023 0000   K 3.6 11/23/2023 1157   CL 107 11/23/2023 1157   CO2 21 (L) 11/23/2023 1157   GLUCOSE 158 (H) 11/23/2023 1157   BUN 8 11/23/2023 1157   BUN 9 02/23/2023 0000   CREATININE 0.73 11/23/2023 1157   CREATININE 0.70 02/29/2016 1240   CALCIUM  9.4 11/23/2023 1157   PROT 6.7 11/22/2023 0947   ALBUMIN 4.3 11/22/2023 0947   AST 15 11/22/2023 0947   ALT 17 11/22/2023 0947   ALKPHOS 120 (H) 11/22/2023 0947   BILITOT 1.0 11/22/2023 0947   GFRNONAA >60 11/23/2023  1157   GFRAA >60 09/26/2019 1144      Latest Ref Rng & Units 11/22/2023    9:47 AM 01/13/2023   12:05 PM 11/25/2022   11:56 AM  Hepatic Function  Total Protein 6.0 - 8.3 g/dL 6.7  6.9  6.6   Albumin 3.5 - 5.2 g/dL 4.3  4.0  4.0   AST 0 - 37 U/L 15  20  20    ALT 0 - 35 U/L 17  20  23    Alk Phosphatase 39 - 882 U/L 120  116  119   Total Bilirubin 0.2  - 1.2 mg/dL 1.0  0.9  1.0   Bilirubin, Direct 0.0 - 0.3 mg/dL 0.2         Current Medications:    Current Outpatient Medications (Cardiovascular):    diltiazem  (CARDIZEM  CD) 360 MG 24 hr capsule, TAKE 1 CAPSULE BY MOUTH EVERY  MORNING   diltiazem  (CARDIZEM ) 30 MG tablet, Take 2 tablets (60 mg total) by mouth 4 (four) times daily as needed (as needed for breakthrough heart racing/palpitations).   flecainide  (TAMBOCOR ) 50 MG tablet, TAKE 1 TABLET BY MOUTH TWICE  DAILY   furosemide  (LASIX ) 20 MG tablet, Take 1 tablet (20 mg total) by mouth daily as needed for fluid.   losartan  (COZAAR ) 50 MG tablet, Take 1.5 tablets (75 mg total) by mouth 2 (two) times daily.   nitroGLYCERIN  (NITROSTAT ) 0.4 MG SL tablet, DISSOLVE 1 TABLET UNDER THE  TONGUE EVERY 5 MINUTES AS NEEDED FOR CHEST PAIN. MAX OF 3 TABLETS IN 15 MINUTES. CALL 911 IF PAIN  PERSISTS.  Current Outpatient Medications (Respiratory):    cetirizine (ZYRTEC) 10 MG tablet, Take 10 mg by mouth daily as needed.  Current Outpatient Medications (Analgesics):    aspirin  EC 81 MG tablet, Take 81 mg by mouth daily.   Current Outpatient Medications (Other):    ACCU-CHEK FASTCLIX LANCETS MISC, USE UP TO 4 TIMES DAILY AS  DIRECTED   ACCU-CHEK GUIDE test strip, USE UP TO 4 TIMES DAILY AS  DIRECTED   ALPRAZolam  (XANAX ) 0.5 MG tablet, Take 0.5-1 tablets (0.25-0.5 mg total) by mouth daily as needed for anxiety.   blood glucose meter kit and supplies KIT, Dispense per patient and insurance preferred. Use up to four times daily as directed. E11.9   omeprazole  (PRILOSEC) 40 MG capsule, Take 1 capsule (40 mg total) by mouth in the morning and at bedtime. TAKE 1 CAPSULE BY MOUTH IN  THE MORNING AND AT BEDTIME   trimethoprim  (TRIMPEX ) 100 MG tablet, Take 100 mg by mouth daily as needed (For UTI per patient).   zolpidem  (AMBIEN ) 5 MG tablet, TAKE 1 TABLET BY MOUTH AT  BEDTIME AS NEEDED FOR SLEEP  Medical History:  Past Medical History:  Diagnosis Date    Allergy     tape, electrodes   Anxiety    AV nodal re-entry tachycardia (HCC)    a. s/p RFA 1993- Dr. Katina, [facility]   Broken neck [facility])    1993   Cervical cancer [facility])    hx of cervical   ca   CHF (congestive heart failure) (HCC)    Chronic chest pain    a. 2005 Cath: Mild nonobstructive plaque (30-40% LCX);  b. 08/2011 low risk myoview ;  c. 09/2011 Coronary CT: NL cors w ca score of 0.   Chronic pain syndrome    Colon polyps    Complication of anesthesia    blood pressure and heart dropped in 2009  Concussion    1979, 1993   Degenerative joint disease    a. s/p L Total Knee Arthroplasty.   Diet-controlled type 2 diabetes mellitus (HCC)    pt. denies   Diverticulosis of colon    Duodenitis without hemorrhage    Dysrhythmia    REPORTS , SINCE PLACEMENT OF PACEMAKER, STILL CAN FEEL HEART  GOING IN AND OUT OF [facility] REPORTS, YESTERDAY HER BP SYSTOLIC WAS IN THE 200s AND WAS BREATHLESS, HR 112, SWEATING ; DENIES SYNCOPE  , DENIES CHEST PAIN    Fibromyalgia    GERD (gastroesophageal reflux disease)    Hemorrhoids    Hypertension    Irritable bowel syndrome    Long QT interval    a. mild - advised to avoid meds that may prolong QT.   Myocardial infarction [facility]) 09/1991   does not know when second heart attack was   Neuropathy    Osteoporosis    Paroxysmal A-fib (HCC)    PER PATIENT. I CAN FEEL MY HEART RACING WHEN IM IN IT    Recurrent upper respiratory infection (URI)    SVT (supraventricular tachycardia) (HCC)    a. nonsustained SVT - previously offered flecainide  but refused;  b. 12/2012 30 day event monitor w/o significant arrhythmias.   Thyroid  nodule    Allergies:  Allergies  Allergen Reactions   Cyclobenzaprine Other (See Comments)    Pt states that this medication does not work for her.     Azithromycin  Other (See Comments)    Cannot take due to prolonged QT   Nitrofurantoin Other (See Comments)   Adhesive [Tape] Itching, Rash and Other (See Comments)     Reaction:  Blisters  Pt states that she is allergic to the adhesive backing of EKG pads if left on for more than 24 hours Patient states she tolerates paper tape ok   Moxifloxacin Other (See Comments)    Unknown allergic reaction many years ago   Quinolones Other (See Comments)    Unknown allergic reaction many years ago     Surgical History:  She  has a past surgical history that includes Biopsy thyroid ; Total hip arthroplasty (10/10); Hysteroscopy (2002); Coronary angioplasty (2007); Cardiac electrophysiology study and ablation; Ablation of dysrhythmic focus (1993); Cholecystectomy (1980); ORIF radial head / neck fracture (09/05/88); Colonoscopy (06/25/2008); Esophagogastroduodenoscopy; Loop recorder implant (08/21/2013); tilt table study (N/A, 05/15/2013); loop recorder implant (N/A, 08/21/2013); Hysteroscopy with D & C (N/A, 02/05/2015); Colposcopy; Cardiac catheterization (N/A, 03/03/2016); Anterior and posterior repair (N/A, 08/01/2017); Cystoscopy (N/A, 08/01/2017); Vaginal hysterectomy (N/A, 08/01/2017); Bilateral salpingectomy (Left, 08/01/2017); Appendectomy; Upper gastrointestinal endoscopy; Colonoscopy (N/A, 12/14/2023); and Esophagogastroduodenoscopy (N/A, 12/14/2023). Family History:  Her family history includes Asthma in her mother and sister; Diabetes in her brother, mother, and sister; Heart disease in her sister and sister; Lung cancer in her father; Ovarian cancer in her mother; Stroke in her mother.  REVIEW OF SYSTEMS  : All other systems reviewed and negative except where noted in the History of Present Illness.  PHYSICAL EXAM: There were no vitals taken for this visit. Physical Exam          Alan JONELLE Coombs, PA-C 1:05 PM

## 2023-12-26 NOTE — Telephone Encounter (Signed)
 Patient seems very hesitant to try different medications had unremarkable EGD colonoscopy, normal CT abdomen pelvis, likely IBS with component of pelvic floor.   Can consider SIBO testing. I think it would be in the patient's best interest to bring her in for an office visit so we can discuss face-to-face and, but shared decision making what she would be willing to do it may be also easier to explain how some of these things could be producing her symptoms. Please schedule patient for follow-up office visit.

## 2023-12-27 ENCOUNTER — Telehealth: Payer: Self-pay

## 2023-12-27 NOTE — Telephone Encounter (Signed)
 I spoke to [place] and she advised that she called because the IBgard that Dr. Suzann recommended she cannot take.  She said that there were too many possible side effects for her because of her heart condition.  She said that she thought after having the procedure she would get a possible fix but instead she has been recommended 2 medications that she cannot take.  She said that she is tired of having pain, discomfort, and bathroom urgency.  She is frustrated because she cannot eat a full meal because of her stomach issues.  She said that she was told that the cells in her esophagus and stomach have changed and she doesn't understand what that means.  She said that she just needs something that can fix her issues.  She said that she doesn't have money to keep buying medications to find out when she gets them home that they will effect her heart.  She would like to know if there is anything that can give her some relief.  She had an appointment to see Alan on 7/17 but she canceled it because I told her that I will have to send a message to the doctor about her concerns.

## 2023-12-28 ENCOUNTER — Telehealth: Payer: Self-pay | Admitting: Pediatrics

## 2023-12-28 ENCOUNTER — Ambulatory Visit: Admitting: Physician Assistant

## 2023-12-28 NOTE — Telephone Encounter (Signed)
 I spoke on the telephone with Renee [name] regarding management of her irritable bowel syndrome.  She expressed concern regarding the use of IBgard and potential interactions with her cardiac issues.  Informs me that she has an old prescription of dicyclomine  10 mg tablets from 2021.  She took 1 of these tablets today and noted some improvement in her symptoms.  Discussed that the medication could have expired.  She will speak with her pharmacist about the safety of using dicyclomine  in the setting of her cardiac issues.  Also reviewed that some side effects can be dose-dependent.  We also discussed the possibility of a trial of Colestid in the future if she is not able to tolerate anticholinergics as she noted loose stools after cholecystectomy.  She is interested in learning more about diets for IBS.  Offered to place a referral to a dietitian for further discussion.  She prefers not to schedule clinic visit at this time but can do so in the future if she wishes.

## 2023-12-28 NOTE — Telephone Encounter (Signed)
 Dr. Suzann is going to call her. (See previous telephone encounter).

## 2023-12-29 ENCOUNTER — Other Ambulatory Visit: Payer: Self-pay

## 2023-12-29 DIAGNOSIS — K589 Irritable bowel syndrome without diarrhea: Secondary | ICD-10-CM

## 2023-12-29 NOTE — Telephone Encounter (Signed)
 Pt made aware of Dr. Suzann recommendations.  Referral placed in Epic.Pt made aware that their office will contact pt and if she has not heard anything within 2 weeks to give our office a call back.   Pt verbalized understanding with all questions answered.

## 2024-01-26 ENCOUNTER — Ambulatory Visit (INDEPENDENT_AMBULATORY_CARE_PROVIDER_SITE_OTHER): Admitting: Internal Medicine

## 2024-01-26 ENCOUNTER — Ambulatory Visit: Payer: Self-pay | Admitting: Internal Medicine

## 2024-01-26 ENCOUNTER — Encounter: Payer: Self-pay | Admitting: Internal Medicine

## 2024-01-26 VITALS — BP 116/74 | HR 61 | Temp 98.1°F | Ht 63.0 in | Wt 169.0 lb

## 2024-01-26 DIAGNOSIS — R310 Gross hematuria: Secondary | ICD-10-CM | POA: Diagnosis not present

## 2024-01-26 DIAGNOSIS — R109 Unspecified abdominal pain: Secondary | ICD-10-CM | POA: Diagnosis not present

## 2024-01-26 DIAGNOSIS — E559 Vitamin D deficiency, unspecified: Secondary | ICD-10-CM | POA: Diagnosis not present

## 2024-01-26 DIAGNOSIS — R739 Hyperglycemia, unspecified: Secondary | ICD-10-CM

## 2024-01-26 DIAGNOSIS — R300 Dysuria: Secondary | ICD-10-CM | POA: Diagnosis not present

## 2024-01-26 DIAGNOSIS — I10 Essential (primary) hypertension: Secondary | ICD-10-CM

## 2024-01-26 LAB — BASIC METABOLIC PANEL WITH GFR
BUN: 9 mg/dL (ref 6–23)
CO2: 27 meq/L (ref 19–32)
Calcium: 9.7 mg/dL (ref 8.4–10.5)
Chloride: 103 meq/L (ref 96–112)
Creatinine, Ser: 0.8 mg/dL (ref 0.40–1.20)
GFR: 70.56 mL/min (ref >60.00)
Glucose, Bld: 90 mg/dL (ref 70–99)
Potassium: 4 meq/L (ref 3.5–5.1)
Sodium: 140 meq/L (ref 135–145)

## 2024-01-26 LAB — CBC WITH DIFFERENTIAL/PLATELET
Basophils Absolute: 0.1 K/uL (ref 0.0–0.1)
Basophils Relative: 0.6 % (ref 0.0–3.0)
Eosinophils Absolute: 0.1 K/uL (ref 0.0–0.7)
Eosinophils Relative: 1 % (ref 0.0–5.0)
HCT: 38.7 % (ref 36.0–46.0)
Hemoglobin: 12.6 g/dL (ref 12.0–15.0)
Lymphocytes Relative: 30.3 % (ref 12.0–46.0)
Lymphs Abs: 2.5 K/uL (ref 0.7–4.0)
MCHC: 32.6 g/dL (ref 30.0–36.0)
MCV: 79.6 fl (ref 78.0–100.0)
Monocytes Absolute: 0.4 K/uL (ref 0.1–1.0)
Monocytes Relative: 5.2 % (ref 3.0–12.0)
Neutro Abs: 5.2 K/uL (ref 1.4–7.7)
Neutrophils Relative %: 62.9 % (ref 43.0–77.0)
Platelets: 261 K/uL (ref 150.0–400.0)
RBC: 4.87 Mil/uL (ref 3.87–5.11)
RDW: 14.4 % (ref 11.5–15.5)
WBC: 8.2 K/uL (ref 4.0–10.5)

## 2024-01-26 LAB — URINALYSIS, ROUTINE W REFLEX MICROSCOPIC
Bilirubin Urine: NEGATIVE
Ketones, ur: NEGATIVE
Nitrite: NEGATIVE
Specific Gravity, Urine: 1.025 (ref 1.000–1.030)
Total Protein, Urine: NEGATIVE
Urine Glucose: NEGATIVE
Urobilinogen, UA: 0.2 (ref 0.0–1.0)
pH: 6 (ref 5.0–8.0)

## 2024-01-26 MED ORDER — AMOXICILLIN-POT CLAVULANATE 875-125 MG PO TABS
1.0000 | ORAL_TABLET | Freq: Two times a day (BID) | ORAL | 0 refills | Status: DC
Start: 2024-01-26 — End: 2024-02-08

## 2024-01-26 NOTE — Assessment & Plan Note (Signed)
 Lab Results  Component Value Date   HGBA1C 6.3 11/22/2023   Stable, pt to continue current medical treatment  - diet, wt control

## 2024-01-26 NOTE — Patient Instructions (Addendum)
 Please take all new medication as prescribed - the antibiotic  Please continue all other medications as before, and refills have been done if requested.  Please have the pharmacy call with any other refills you may need.  Please keep your appointments with your specialists as you may have planned  You will be contacted regarding the referral for: CT renal  Please go to the LAB at the blood drawing area for the tests to be done  You will be contacted by phone if any changes need to be made immediately.  Otherwise, you will receive a letter about your results with an explanation, but please check with MyChart first.

## 2024-01-26 NOTE — Progress Notes (Signed)
 The test results show that your current treatment is OK, as the blood counts and kidney tests are stable. The urine test is consistent with infection as we suspected Please continue the same plan with the current antibiotic.  The culture result should return in 1-2 more days.  There is no other need for change of treatment or further evaluation based on these results, at this time.  thanks

## 2024-01-26 NOTE — Assessment & Plan Note (Signed)
 Last vitamin D  Lab Results  Component Value Date   VD25OH 33.40 11/22/2023   Low, to start oral replacement

## 2024-01-26 NOTE — Assessment & Plan Note (Signed)
 Likely uti, for ua and cx, empiric cipro 500 mg bid course, follow urine culture

## 2024-01-26 NOTE — Assessment & Plan Note (Signed)
 BP Readings from Last 3 Encounters:  01/26/24 116/74  12/14/23 139/71  11/23/23 110/69   Stable, pt to continue medical treatment card cd 360 mg, losartan  50 mg qd

## 2024-01-26 NOTE — Assessment & Plan Note (Signed)
 Likely renal involvement it seems, for tx as above

## 2024-01-26 NOTE — Progress Notes (Signed)
 Patient ID: Renee [name], female   DOB: 21-Oct-1945, 78 y.o.   MRN: [id number]        Chief Complaint: follow up left flank pain, dysuria, gross hematuria, htn, low vit d       HPI:  Renee [name] is a 78 y.o. female here with c/o 2-3 days onset dysuria and frequency, now with left flank pain as well today.  Also noted tea colored urine earlier today.  No hx of malignancy or stone.  Pt denies chest pain, increased sob or doe, wheezing, orthopnea, PND, increased LE swelling, palpitations, dizziness or syncope.  Pt denies polydipsia, polyuria, or new focal neuro s/s.    Pt denies high fever, wt loss, night sweats, loss of appetite, or other constitutional symptoms        Wt Readings from Last 3 Encounters:  01/26/24 169 lb (76.7 kg)  12/14/23 168 lb (76.2 kg)  11/22/23 171 lb 9.6 oz (77.8 kg)   BP Readings from Last 3 Encounters:  01/26/24 116/74  12/14/23 139/71  11/23/23 110/69         Past Medical History:  Diagnosis Date   Allergy     tape, electrodes   Anxiety    AV nodal re-entry tachycardia (HCC)    a. s/p RFA 1993- Dr. Katina, [facility]   Broken neck [facility])    1993   Cervical cancer (HCC)    hx of cervical   ca   CHF (congestive heart failure) (HCC)    Chronic chest pain    a. 2005 Cath: Mild nonobstructive plaque (30-40% LCX);  b. 08/2011 low risk myoview ;  c. 09/2011 Coronary CT: NL cors w ca score of 0.   Chronic pain syndrome    Colon polyps    Complication of anesthesia    blood pressure and heart dropped in 2009     Concussion    1979, 1993   Degenerative joint disease    a. s/p L Total Knee Arthroplasty.   Diet-controlled type 2 diabetes mellitus (HCC)    pt. denies   Diverticulosis of colon    Duodenitis without hemorrhage    Dysrhythmia    REPORTS , SINCE PLACEMENT OF PACEMAKER, STILL CAN FEEL HEART  GOING IN AND OUT OF [facility] REPORTS, YESTERDAY HER BP SYSTOLIC WAS IN THE 200s AND WAS BREATHLESS, HR 112, SWEATING ; DENIES SYNCOPE  , DENIES CHEST PAIN     Fibromyalgia    GERD (gastroesophageal reflux disease)    Hemorrhoids    Hypertension    Irritable bowel syndrome    Long QT interval    a. mild - advised to avoid meds that may prolong QT.   Myocardial infarction [facility]) 09/1991   does not know when second heart attack was   Neuropathy    Osteoporosis    Paroxysmal A-fib (HCC)    PER PATIENT. I CAN FEEL MY HEART RACING WHEN IM IN IT    Recurrent upper respiratory infection (URI)    SVT (supraventricular tachycardia) (HCC)    a. nonsustained SVT - previously offered flecainide  but refused;  b. 12/2012 30 day event monitor w/o significant arrhythmias.   Thyroid  nodule    Past Surgical History:  Procedure Laterality Date   ABLATION OF DYSRHYTHMIC FOCUS  1993   ANTERIOR AND POSTERIOR REPAIR N/A 08/01/2017   Procedure: ANTERIOR (CYSTOCELE) AND POSTERIOR REPAIR (RECTOCELE);  Surgeon: Gaston Hamilton, MD;  Location: [facility];  Service: Urology;  Laterality: N/A;   APPENDECTOMY     1980  BILATERAL SALPINGECTOMY Left 08/01/2017   Procedure: SALPINGECTOMY;  Surgeon: Sarrah Browning, MD;  Location: [facility];  Service: Gynecology;  Laterality: Left;   BIOPSY THYROID      CARDIAC ELECTROPHYSIOLOGY STUDY AND ABLATION     atrioventricular nodal reentant tachycardia   CHOLECYSTECTOMY  1980   COLONOSCOPY  06/25/2008   Diverticulosis and Hemorrhoids   COLONOSCOPY N/A 12/14/2023   Procedure: COLONOSCOPY;  Surgeon: Suzann Inocente HERO, MD;  Location: [facility];  Service: Gastroenterology;  Laterality: N/A;   COLPOSCOPY     CORONARY ANGIOPLASTY  2007   CYSTOSCOPY N/A 08/01/2017   Procedure: CYSTOSCOPY;  Surgeon: Gaston Hamilton, MD;  Location: [facility];  Service: Urology;  Laterality: N/A;   EP IMPLANTABLE DEVICE N/A 03/03/2016   Procedure: Pacemaker Implant;  Surgeon: Danelle LELON Birmingham, MD;  Location: [facility] INVASIVE CV LAB;  Service: Cardiovascular;  Laterality: N/A;   ESOPHAGOGASTRODUODENOSCOPY     ESOPHAGOGASTRODUODENOSCOPY N/A 12/14/2023   Procedure: EGD  (ESOPHAGOGASTRODUODENOSCOPY);  Surgeon: Suzann Inocente HERO, MD;  Location: [facility];  Service: Gastroenterology;  Laterality: N/A;   HYSTEROSCOPY  2002   with resection of endometrial polyps. by Dr.MCPhail   HYSTEROSCOPY WITH D & C N/A 02/05/2015   Procedure: DILATATION AND CURETTAGE /HYSTEROSCOPY;  Surgeon: Marjorie Gull, MD;  Location: [facility];  Service: Gynecology;  Laterality: N/A;   LOOP RECORDER IMPLANT  08/21/2013   MDT LinQ implanted by Dr Birmingham for syncope   LOOP RECORDER IMPLANT N/A 08/21/2013   Procedure: LOOP RECORDER IMPLANT;  Surgeon: Danelle LELON Birmingham, MD;  [facility] C[facility];  Service: Cardiovascular;  Laterality: N/A;   ORIF RADIAL HEAD / NECK FRACTURE  09/05/88   TILT TABLE STUDY N/A 05/15/2013   Procedure: TILT TABLE STUDY;  Surgeon: Elspeth JAYSON Sage, MD;  Location: [facility] CATH LAB;  Service: Cardiovascular;  Laterality: N/A;   TOTAL HIP ARTHROPLASTY  10/10   Left, at [facility]   UPPER GASTROINTESTINAL ENDOSCOPY     VAGINAL HYSTERECTOMY N/A 08/01/2017   Procedure: HYSTERECTOMY VAGINAL;  Surgeon: Sarrah Browning, MD;  Location: [facility];  Service: Gynecology;  Laterality: N/A;    reports that she has never smoked. She has never used smokeless tobacco. She reports that she does not currently use alcohol. She reports that she does not use drugs. family history includes Asthma in her mother and sister; Diabetes in her brother, mother, and sister; Heart disease in her sister and sister; Lung cancer in her father; Ovarian cancer in her mother; Stroke in her mother. Allergies  Allergen Reactions   Cyclobenzaprine Other (See Comments)    Pt states that this medication does not work for her.     Azithromycin  Other (See Comments)    Cannot take due to prolonged QT   Nitrofurantoin Other (See Comments)   Adhesive [Tape] Itching, Rash and Other (See Comments)    Reaction:  Blisters  Pt states that she is allergic to the adhesive backing of EKG pads if left on for more than 24  hours Patient states she tolerates paper tape ok   Moxifloxacin Other (See Comments)    Unknown allergic reaction many years ago   Quinolones Other (See Comments)    Unknown allergic reaction many years ago   Current Outpatient Medications on File Prior to Visit  Medication Sig Dispense Refill   ACCU-CHEK FASTCLIX LANCETS MISC USE UP TO 4 TIMES DAILY AS  DIRECTED 408 each 1   ACCU-CHEK GUIDE test strip USE UP TO 4 TIMES DAILY AS  DIRECTED 100 each  1   ALPRAZolam  (XANAX ) 0.5 MG tablet Take 0.5-1 tablets (0.25-0.5 mg total) by mouth daily as needed for anxiety. 90 tablet 1   aspirin  EC 81 MG tablet Take 81 mg by mouth daily.     blood glucose meter kit and supplies KIT Dispense per patient and insurance preferred. Use up to four times daily as directed. E11.9 1 each 0   cetirizine (ZYRTEC) 10 MG tablet Take 10 mg by mouth daily as needed.     diltiazem  (CARDIZEM  CD) 360 MG 24 hr capsule TAKE 1 CAPSULE BY MOUTH EVERY  MORNING 100 capsule 2   diltiazem  (CARDIZEM ) 30 MG tablet Take 2 tablets (60 mg total) by mouth 4 (four) times daily as needed (as needed for breakthrough heart racing/palpitations). 90 tablet 0   flecainide  (TAMBOCOR ) 50 MG tablet TAKE 1 TABLET BY MOUTH TWICE  DAILY 200 tablet 2   furosemide  (LASIX ) 20 MG tablet Take 1 tablet (20 mg total) by mouth daily as needed for fluid. 90 tablet 3   losartan  (COZAAR ) 50 MG tablet Take 1.5 tablets (75 mg total) by mouth 2 (two) times daily. 200 tablet 2   nitroGLYCERIN  (NITROSTAT ) 0.4 MG SL tablet DISSOLVE 1 TABLET UNDER THE  TONGUE EVERY 5 MINUTES AS NEEDED FOR CHEST PAIN. MAX OF 3 TABLETS IN 15 MINUTES. CALL 911 IF PAIN  PERSISTS. 125 tablet 2   omeprazole  (PRILOSEC) 40 MG capsule Take 1 capsule (40 mg total) by mouth in the morning and at bedtime. TAKE 1 CAPSULE BY MOUTH IN  THE MORNING AND AT BEDTIME 180 capsule 3   trimethoprim  (TRIMPEX ) 100 MG tablet Take 100 mg by mouth daily as needed (For UTI per patient).     zolpidem  (AMBIEN ) 5 MG  tablet TAKE 1 TABLET BY MOUTH AT  BEDTIME AS NEEDED FOR SLEEP 90 tablet 1   No current facility-administered medications on file prior to visit.        ROS:  All others reviewed and negative.  Objective        PE:  BP 116/74 (BP Location: Left Arm, Patient Position: Sitting, Cuff Size: Normal)   Pulse 61   Temp 98.1 F (36.7 C) (Oral)   Ht 5' 3 (1.6 m)   Wt 169 lb (76.7 kg)   BMI 29.94 kg/m                 Constitutional: Pt appears in NAD               HENT: Head: NCAT.                Right Ear: External ear normal.                 Left Ear: External ear normal.                Eyes: . Pupils are equal, round, and reactive to light. Conjunctivae and EOM are normal               Nose: without d/c or deformity               Neck: Neck supple. Gross normal ROM               Cardiovascular: Normal rate and regular rhythm.                 Pulmonary/Chest: Effort normal and breath sounds without rales or wheezing.  Abd:  Soft, NT, ND, + BS, no organomegaly, + left flank tender               Neurological: Pt is alert. At baseline orientation, motor grossly intact               Skin: Skin is warm. No rashes, no other new lesions, LE edema - none               Psychiatric: Pt behavior is normal without agitation   Micro: none  Cardiac tracings I have personally interpreted today:  none  Pertinent Radiological findings (summarize): none   Lab Results  Component Value Date   WBC 8.2 01/26/2024   HGB 12.6 01/26/2024   HCT 38.7 01/26/2024   PLT 261.0 01/26/2024   GLUCOSE 90 01/26/2024   CHOL 146 11/22/2023   TRIG 102.0 11/22/2023   HDL 58.90 11/22/2023   LDLCALC 66 11/22/2023   ALT 17 11/22/2023   AST 15 11/22/2023   NA 140 01/26/2024   K 4.0 01/26/2024   CL 103 01/26/2024   CREATININE 0.80 01/26/2024   BUN 9 01/26/2024   CO2 27 01/26/2024   TSH 0.85 11/22/2023   INR 0.9 03/31/2020   HGBA1C 6.3 11/22/2023   MICROALBUR 0.8 11/22/2023   Assessment/Plan:   Renee [name] is a 78 y.o. White or Caucasian [1] female with  has a past medical history of Allergy , Anxiety, AV nodal re-entry tachycardia (HCC), Broken neck (HCC), Cervical cancer (HCC), CHF (congestive heart failure) (HCC), Chronic chest pain, Chronic pain syndrome, Colon polyps, Complication of anesthesia, Concussion, Degenerative joint disease, Diet-controlled type 2 diabetes mellitus (HCC), Diverticulosis of colon, Duodenitis without hemorrhage, Dysrhythmia, Fibromyalgia, GERD (gastroesophageal reflux disease), Hemorrhoids, Hypertension, Irritable bowel syndrome, Long QT interval, Myocardial infarction (HCC) (09/1991), Neuropathy, Osteoporosis, Paroxysmal A-fib (HCC), Recurrent upper respiratory infection (URI), SVT (supraventricular tachycardia) (HCC), and Thyroid  nodule.  Vitamin D  deficiency Last vitamin D  Lab Results  Component Value Date   VD25OH 33.40 11/22/2023   Low, to start oral replacement    Hyperglycemia Lab Results  Component Value Date   HGBA1C 6.3 11/22/2023   Stable, pt to continue current medical treatment  - diet, wt control  Essential hypertension BP Readings from Last 3 Encounters:  01/26/24 116/74  12/14/23 139/71  11/23/23 110/69   Stable, pt to continue medical treatment card cd 360 mg, losartan  50 mg qd   Dysuria Likely uti, for ua and cx, empiric cipro 500 mg bid course, follow urine culture  Left flank pain Likely renal involvement it seems, for tx as above  Gross hematuria Also for CT renal as cannot r/o malignancy or stone  Followup: Return if symptoms worsen or fail to improve.  Lynwood Rush, MD 01/26/2024 8:30 PM [facility]

## 2024-01-26 NOTE — Assessment & Plan Note (Signed)
 Also for CT renal as cannot r/o malignancy or stone

## 2024-01-27 LAB — URINE CULTURE: Result:: NO GROWTH

## 2024-02-01 ENCOUNTER — Other Ambulatory Visit: Payer: Self-pay | Admitting: Internal Medicine

## 2024-02-01 ENCOUNTER — Ambulatory Visit
Admission: RE | Admit: 2024-02-01 | Discharge: 2024-02-01 | Disposition: A | Discharge status: Home / Self Care | Source: Ambulatory Visit | Attending: Internal Medicine | Admitting: Internal Medicine

## 2024-02-01 DIAGNOSIS — R310 Gross hematuria: Secondary | ICD-10-CM | POA: Diagnosis not present

## 2024-02-01 DIAGNOSIS — R109 Unspecified abdominal pain: Secondary | ICD-10-CM

## 2024-02-01 DIAGNOSIS — R300 Dysuria: Secondary | ICD-10-CM

## 2024-02-06 ENCOUNTER — Telehealth: Payer: Self-pay | Admitting: Internal Medicine

## 2024-02-06 ENCOUNTER — Ambulatory Visit (INDEPENDENT_AMBULATORY_CARE_PROVIDER_SITE_OTHER): Payer: Medicare Other

## 2024-02-06 DIAGNOSIS — R55 Syncope and collapse: Secondary | ICD-10-CM | POA: Diagnosis not present

## 2024-02-06 NOTE — Telephone Encounter (Signed)
 Pt states she received a medication on the 17th and is unable to take this prescription.  Pt also asks for Urologist recommendation.   Best call back @ [phone]

## 2024-02-07 ENCOUNTER — Ambulatory Visit: Payer: Self-pay | Admitting: Internal Medicine

## 2024-02-07 LAB — CUP PACEART REMOTE DEVICE CHECK
Date Time Interrogation Session: 20250826105816
Implantable Lead Connection Status: 753985
Implantable Lead Connection Status: 753985
Implantable Lead Implant Date: 20170921
Implantable Lead Implant Date: 20170921
Implantable Lead Location: 753859
Implantable Lead Location: 753860
Implantable Lead Model: 377
Implantable Lead Model: 377
Implantable Lead Serial Number: 49575519
Implantable Lead Serial Number: 49580836
Implantable Pulse Generator Implant Date: 20170921
Pulse Gen Model: 407145
Pulse Gen Serial Number: 68850891

## 2024-02-08 ENCOUNTER — Other Ambulatory Visit: Payer: Self-pay

## 2024-02-09 ENCOUNTER — Telehealth (HOSPITAL_COMMUNITY): Payer: Self-pay | Admitting: Cardiology

## 2024-02-09 NOTE — Telephone Encounter (Signed)
 Called to confirm/remind patient of their appointment at [facility] on 02/09/24.   Appointment:   [] Confirmed  [] Left mess   [] No answer/No voice mail  [x] VM Full/unable to leave message  [] Phone not in service  Patient reminded to bring all medications and/or complete list.  Confirmed patient has transportation. Gave directions, instructed to utilize valet parking.

## 2024-02-13 ENCOUNTER — Ambulatory Visit (HOSPITAL_COMMUNITY)
Admission: RE | Admit: 2024-02-13 | Discharge: 2024-02-13 | Disposition: A | Discharge status: Home / Self Care | Source: Ambulatory Visit | Attending: Cardiology | Admitting: Cardiology

## 2024-02-13 ENCOUNTER — Encounter (HOSPITAL_COMMUNITY): Payer: Self-pay | Admitting: Cardiology

## 2024-02-13 VITALS — BP 140/78 | HR 64 | Wt 170.6 lb

## 2024-02-13 DIAGNOSIS — I5032 Chronic diastolic (congestive) heart failure: Secondary | ICD-10-CM | POA: Insufficient documentation

## 2024-02-13 DIAGNOSIS — I471 Supraventricular tachycardia, unspecified: Secondary | ICD-10-CM | POA: Insufficient documentation

## 2024-02-13 DIAGNOSIS — G629 Polyneuropathy, unspecified: Secondary | ICD-10-CM | POA: Diagnosis not present

## 2024-02-13 DIAGNOSIS — I4581 Long QT syndrome: Secondary | ICD-10-CM | POA: Diagnosis not present

## 2024-02-13 DIAGNOSIS — I509 Heart failure, unspecified: Secondary | ICD-10-CM

## 2024-02-13 DIAGNOSIS — M1990 Unspecified osteoarthritis, unspecified site: Secondary | ICD-10-CM | POA: Diagnosis not present

## 2024-02-13 DIAGNOSIS — R0789 Other chest pain: Secondary | ICD-10-CM | POA: Insufficient documentation

## 2024-02-13 DIAGNOSIS — I480 Paroxysmal atrial fibrillation: Secondary | ICD-10-CM | POA: Diagnosis not present

## 2024-02-13 DIAGNOSIS — I110 Hypertensive heart disease with heart failure: Secondary | ICD-10-CM | POA: Diagnosis not present

## 2024-02-13 DIAGNOSIS — G4733 Obstructive sleep apnea (adult) (pediatric): Secondary | ICD-10-CM | POA: Insufficient documentation

## 2024-02-13 DIAGNOSIS — G894 Chronic pain syndrome: Secondary | ICD-10-CM | POA: Diagnosis not present

## 2024-02-13 DIAGNOSIS — G4700 Insomnia, unspecified: Secondary | ICD-10-CM | POA: Insufficient documentation

## 2024-02-13 DIAGNOSIS — I442 Atrioventricular block, complete: Secondary | ICD-10-CM | POA: Diagnosis not present

## 2024-02-13 DIAGNOSIS — R0602 Shortness of breath: Secondary | ICD-10-CM | POA: Diagnosis present

## 2024-02-13 DIAGNOSIS — Z950 Presence of cardiac pacemaker: Secondary | ICD-10-CM | POA: Insufficient documentation

## 2024-02-13 LAB — VITAMIN B12: Vitamin B-12: 247 pg/mL (ref 180–914)

## 2024-02-13 MED ORDER — TRAZODONE HCL 50 MG PO TABS: 50.0000 mg | ORAL_TABLET | Freq: Every evening | ORAL | 1 refills | Status: DC | PRN

## 2024-02-13 NOTE — Patient Instructions (Signed)
 Medication Changes:  START TRAZODONE  50MG  DAILY AS NEEDED AT BEDTIME   Lab Work:  Labs done today, your results will be available in MyChart, we will contact you for abnormal readings.  Referrals:  YOU HAVE BEEN REFERRED TO DR. RICE WITH RHEUMATOLOGY THEY WILL REACH OUT TO YOU OR CALL TO ARRANGE THIS. PLEASE CALL US  WITH ANY CONCERNS   YOU HAVE BEEN REFERRED TO PREP EXERCISE CLASS THEY WILL REACH OUT TO YOU OR CALL TO ARRANGE THIS. PLEASE CALL US  WITH ANY CONCERNS   Follow-Up in: 6 MONTHS PLEASE CALL OUR OFFICE AROUND JANUARY TO GET SCHEDULED FOR YOUR MARCH APPOINTMENT. PHONE NUMBER IS [phone] OPTION 2 R  At the [facility], you and your health needs are our priority. We have a designated team specialized in the treatment of Heart Failure. This Care Team includes your primary Heart Failure Specialized Cardiologist (physician), Advanced Practice Providers (APPs- Physician Assistants and Nurse Practitioners), and Pharmacist who all work together to provide you with the care you need, when you need it.   You may see any of the following providers on your designated Care Team at your next follow up:  Dr. Toribio Fuel Dr. Ezra Shuck Dr. Ria Commander Dr. Odis Brownie Greig Mosses, NP [place]affie Shed, [place] [facility] [place], [place] Beckey Coe, NP [place] Lee, NP Tinnie Redman, PharmD   Please be sure to bring in all your medications bottles to every appointment.   Need to Contact Us :  If you have any questions or concerns before your next appointment please send us  a message through [place] or call our office at [phone].    TO LEAVE A MESSAGE FOR THE NURSE SELECT OPTION 2, PLEASE LEAVE A MESSAGE INCLUDING: YOUR NAME DATE OF BIRTH CALL BACK NUMBER REASON FOR CALL**this is important as we prioritize the call backs  YOU WILL RECEIVE A CALL BACK THE SAME DAY AS LONG AS YOU CALL BEFORE 4:00 PM
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
## 2024-02-14 ENCOUNTER — Ambulatory Visit (HOSPITAL_COMMUNITY): Payer: Self-pay | Admitting: Cardiology

## 2024-02-14 ENCOUNTER — Encounter (HOSPITAL_COMMUNITY): Payer: Self-pay | Admitting: Cardiology

## 2024-02-15 ENCOUNTER — Telehealth: Payer: Self-pay

## 2024-02-15 LAB — ANA W/REFLEX: Anti Nuclear Antibody (ANA): NEGATIVE

## 2024-02-15 NOTE — Telephone Encounter (Signed)
 She is interested in the class, wants Spe[facility]. From the records has done half of the program before. Needs November time frame, I told her we would be back in touch about the next Spears class.

## 2024-02-16 LAB — RHEUMATOID FACTOR: Rheumatoid fact SerPl-aCnc: 10.3 [IU]/mL (ref <14.0)

## 2024-02-22 ENCOUNTER — Ambulatory Visit: Payer: Self-pay

## 2024-02-22 NOTE — Telephone Encounter (Signed)
 FYI Only or Action Required?: Action required by provider: lab or test result follow-up needed. Pt requesting imaging. Pt states she will not make an appt until she has xrays of her back.  Patient was last seen in primary care on 01/26/2024 by Norleen Lynwood ORN, MD.  Called Nurse Triage reporting Back Pain.  Symptoms began several weeks ago.  Interventions attempted: OTC medications: tylenol .  Symptoms are: unchanged.  Triage Disposition: See HCP Within 4 Hours (Or PCP Triage)  Patient/caregiver understands and will follow disposition?: No, wishes to speak with PCP  Copied from CRM #[id number]. Topic: Clinical - Red Word Triage >> Feb 22, 2024  4:11 PM [name] wrote: [facility] that prompted transfer to Nurse Triage: Pain going left bottom of shoulder blade to buttocks. Pain is not getting better. Hurts to walk around. Reason for Disposition  [1] SEVERE back pain (e.g., excruciating, unable to do any normal activities) AND [2] not improved 2 hours after pain medicine  Answer Assessment - Initial Assessment Questions 1. ONSET: When did the pain begin? (e.g., minutes, hours, days)     Some time 2. LOCATION: Where does it hurt? (upper, mid or lower back)     L sided shoulder blade down 3. SEVERITY: How bad is the pain?  (e.g., Scale 1-10; mild, moderate, or severe)     severe 4. PATTERN: Is the pain constant? (e.g., yes, no; constant, intermittent)      constant 5. RADIATION: Does the pain shoot into your legs or somewhere else?     All the way to buttock 6. CAUSE:  What do you think is causing the back pain?      denies 8. MEDICINES: What have you taken so far for the pain? (e.g., nothing, acetaminophen , NSAIDS)     tylenol  10. OTHER SYMPTOMS: Do you have any other symptoms? (e.g., fever, abdomen pain, burning with urination, blood in urine)       Denies  Pt requesting xray of her back, pt does not want to make appt at this time, states she will make appt if she  needs after the imaging. Pt states that it is not a UTI, I told you I already seen urology and they said I don't.  Pt states that he pain is severe and that she is eating tylenol .  Pt is also upset that her PCP sends a note to her mychart about her imaging results. Pt would like to see the rad report.  Protocols used: Back Pain-A-AH

## 2024-02-23 ENCOUNTER — Telehealth: Payer: Self-pay | Admitting: Pediatrics

## 2024-02-23 NOTE — Telephone Encounter (Signed)
 Received a call from patient stating she was admitted into [facility] for CT scan on 5/20, she exclaimed no one has given her updates, she had a swelling reaction during the scan and is in the unknown regarding her condition, also mentions PA's attending her were not careful. Patient is requesting a nurse call to discuss results and further scheduling. Please review and advise   Thank you

## 2024-02-26 ENCOUNTER — Encounter: Payer: Self-pay | Admitting: Pediatrics

## 2024-02-26 NOTE — Progress Notes (Signed)
 Remote PPM Transmission

## 2024-02-26 NOTE — Telephone Encounter (Signed)
 Pt stated that she wanted Dr. Suzann to know about what happened on 10/31/2023 at her CT scan.  Please see phone note on 5/20 from  Radiology PA. Allergy  to the CT contrast was added to pt chart.  Please review as Pt requested.

## 2024-02-26 NOTE — Telephone Encounter (Signed)
 Inbound call from Renee [name] again wanting to inform Dr. Suzann that on 5/20 she had a CT scan done and get ended up having a severe reaction to the dye they used and she was not able to find in her chart the name of the dye that was used. Please advise.

## 2024-02-28 ENCOUNTER — Telehealth: Payer: Self-pay

## 2024-02-28 ENCOUNTER — Telehealth: Payer: Self-pay | Admitting: Radiology

## 2024-02-28 DIAGNOSIS — M549 Dorsalgia, unspecified: Secondary | ICD-10-CM

## 2024-02-28 NOTE — Telephone Encounter (Signed)
 Copied from CRM #[id number]. Topic: Clinical - Red Word Triage >> Feb 22, 2024  4:11 PM [name] wrote: [facility] that prompted transfer to Nurse Triage: Pain going left bottom of shoulder blade to buttocks. Pain is not getting better. Hurts to walk around. >> Feb 28, 2024 10:14 AM [name] wrote: Patient is calling in to see if Dr. Norleen has ordered any testing for her back including x rays from her shoulder blades down to her butt. Please advise patient as the pain is still the same. Patient does not want to do a MRI.

## 2024-02-28 NOTE — Telephone Encounter (Signed)
 Received patient's email. It is a personal drawing of what she says she saw on her end on 6/25/ that was concerning to her.  It is still unclear as to what she is referring to. (Image below).  There is no documentation of anything being sent to her my chart on 6/23 or 6/25.  If patient is concerned, she can send a manual transmission for us  to re-evaluate although all has been normal with recent transmissions: most recent on 02/06/24.

## 2024-02-28 NOTE — Telephone Encounter (Signed)
 Spoke w/ patient - she refused to see APP, patient is therefore scheduled to see Dr. Waddell at his first available on 10/16.

## 2024-02-28 NOTE — Telephone Encounter (Signed)
 Patient is past due for annual device check with Dr. Waddell.  She is reporting some concerns (see separate my chart message with documentation).  Cannot see anything acutely on device.  Recommend we get her in sooner than later.  If she is okay with seeing an APP for follow up - may be able to get in sooner.  Patient aware to be expecting a call as I notified her in [place].   Forwarding to scheduling team to set up.

## 2024-02-29 NOTE — Telephone Encounter (Signed)
 Ok sure xrays thoracic spine and lumbar spine are odered, to be done at her convenience

## 2024-02-29 NOTE — Addendum Note (Signed)
 Addended by: NORLEEN LYNWOOD ORN on: 02/29/2024 04:57 PM   Modules accepted: Orders

## 2024-03-01 ENCOUNTER — Ambulatory Visit (HOSPITAL_COMMUNITY)

## 2024-03-08 ENCOUNTER — Telehealth: Payer: Self-pay | Admitting: Radiology

## 2024-03-08 NOTE — Telephone Encounter (Signed)
 Copied from CRM [id number]. Topic: Clinical - Request for Lab/Test Order >> Mar 07, 2024  1:17 PM Anairis L wrote: Reason for CRM: Patient is requesting a call back because she wants to schedule a x-ray.

## 2024-03-19 NOTE — Telephone Encounter (Signed)
 Called and unable to leave voicemail due to mailbox full, If Pt calls back please let her know her xray orders are in & she may come into the office to have them done.

## 2024-03-21 ENCOUNTER — Ambulatory Visit

## 2024-03-21 DIAGNOSIS — M5136 Other intervertebral disc degeneration, lumbar region with discogenic back pain only: Secondary | ICD-10-CM | POA: Diagnosis not present

## 2024-03-21 DIAGNOSIS — M549 Dorsalgia, unspecified: Secondary | ICD-10-CM

## 2024-03-21 DIAGNOSIS — M48061 Spinal stenosis, lumbar region without neurogenic claudication: Secondary | ICD-10-CM | POA: Diagnosis not present

## 2024-03-21 DIAGNOSIS — Z043 Encounter for examination and observation following other accident: Secondary | ICD-10-CM | POA: Diagnosis not present

## 2024-03-21 DIAGNOSIS — M4187 Other forms of scoliosis, lumbosacral region: Secondary | ICD-10-CM | POA: Diagnosis not present

## 2024-03-27 ENCOUNTER — Ambulatory Visit: Payer: Self-pay | Admitting: Internal Medicine

## 2024-03-28 ENCOUNTER — Ambulatory Visit: Attending: Internal Medicine | Admitting: Internal Medicine

## 2024-03-28 ENCOUNTER — Encounter: Payer: Self-pay | Admitting: Internal Medicine

## 2024-03-28 VITALS — BP 152/90 | HR 76 | Ht 63.0 in | Wt 170.0 lb

## 2024-03-28 DIAGNOSIS — I495 Sick sinus syndrome: Secondary | ICD-10-CM | POA: Diagnosis not present
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
## 2024-03-28 NOTE — Patient Instructions (Signed)

## 2024-03-29 ENCOUNTER — Telehealth: Payer: Self-pay | Admitting: Internal Medicine

## 2024-03-29 NOTE — Telephone Encounter (Signed)
 Patient wants a call back to discuss when the battery on her Biotronic device will expire.  Patient noted she received her device in 2017.

## 2024-03-29 NOTE — Telephone Encounter (Signed)
 I called and spoke with the patient. She states that she wanted to know how much battery is left on her device as she did not feel like she was given a clear answer yesterday.   Advised the patient that per yesterday's in office interrogation, she has ~ 5 years left on her PPM battery. She has been made aware that this is an estimate and this can vary depending on how much she uses her device.   All questions were answered and the patient was appreciative of the call back.

## 2024-04-01 ENCOUNTER — Telehealth: Payer: Self-pay

## 2024-04-01 ENCOUNTER — Other Ambulatory Visit (HOSPITAL_COMMUNITY): Payer: Self-pay | Admitting: Internal Medicine

## 2024-04-01 NOTE — Telephone Encounter (Signed)
 Left message about upcoming PREP Class at Renee [name] starting on Nov 3 and asked her to return my call.

## 2024-04-02 ENCOUNTER — Telehealth (HOSPITAL_COMMUNITY): Payer: Self-pay

## 2024-04-02 LAB — CUP PACEART INCLINIC DEVICE CHECK
Date Time Interrogation Session: 20251016124336
Implantable Lead Connection Status: 753985
Implantable Lead Connection Status: 753985
Implantable Lead Implant Date: 20170921
Implantable Lead Implant Date: 20170921
Implantable Lead Location: 753859
Implantable Lead Location: 753860
Implantable Lead Model: 377
Implantable Lead Model: 377
Implantable Lead Serial Number: 49575519
Implantable Lead Serial Number: 49580836
Implantable Pulse Generator Implant Date: 20170921
Pulse Gen Model: 407145
Pulse Gen Serial Number: 68850891

## 2024-04-02 NOTE — Telephone Encounter (Signed)
  ADVANCED [facility] FAILURE [facility]IN[facility]   Pre-operative Risk Assessment   HEARTCARE STAFF-IMPORTANT INSTRUCTIONS 1 Red and Blue Text will auto delete once note is signed or closed. 2 Press F2 to navigate through template.   3 On drop down lists, L click to select >> R click to activate next field 4 Reason for Visit format is IMPORTANT!!  See Directions on No. 2 below. 5 Please review chart to determine if there is already a clearance note open for this procedure!!  DO NOT duplicate if a note already exists!!    :1}     Request for Surgical Clearance    Procedure:  Deep Cleaning, Restoration, Extractions  Date of Surgery:  Clearance TBD                                 Surgeon:  Dr. Janis Dublin Surgeon's Group or Practice Name:  [facility] Phone number:  [phone] Fax number:  [phone]   Type of Clearance Requested:   - Medical    Type of Anesthesia:  Local    Additional requests/questions:  Does this patient need antibiotics? Please advise surgeon/provider what medications should be held.  Signed, Lisa CHRISTELLA Sergeant   04/02/2024, 2:39 PM   [facility] Harlene Gainer, FNP [facility] [address] [facility] [place] [address] [phone] (office) [phone] (fax)

## 2024-04-02 NOTE — Telephone Encounter (Signed)
 Cardiac clearance sent via epic

## 2024-04-04 DIAGNOSIS — R310 Gross hematuria: Secondary | ICD-10-CM | POA: Diagnosis not present

## 2024-04-04 DIAGNOSIS — N3020 Other chronic cystitis without hematuria: Secondary | ICD-10-CM | POA: Diagnosis not present

## 2024-04-05 ENCOUNTER — Encounter (HOSPITAL_COMMUNITY): Payer: Self-pay | Admitting: Cardiology

## 2024-04-09 ENCOUNTER — Encounter: Admitting: Internal Medicine

## 2024-04-09 ENCOUNTER — Other Ambulatory Visit (HOSPITAL_COMMUNITY): Payer: Self-pay | Admitting: Cardiology

## 2024-04-10 ENCOUNTER — Telehealth: Payer: Self-pay

## 2024-04-10 NOTE — Telephone Encounter (Signed)
 Spoke with Renee [name] about upcoming class and she would like to wait until the New year to join one. We will call her again.

## 2024-04-15 ENCOUNTER — Telehealth: Payer: Self-pay | Admitting: Pediatrics

## 2024-04-15 NOTE — Telephone Encounter (Signed)
 Inbound call from patient stating she's been experiencing bright green bowel movements for about 2-3 weeks now and after doing some [employer] research patient seen while taking antibiotics it can cause bowel movements to change color but not while taking tylenol . Patient would like to speak to nurse and be advised on what to do. Please advise  Thank you

## 2024-04-15 NOTE — Telephone Encounter (Signed)
 Called & spoke with patient. Patient states that her bowel movements were bright green like a christmas tree for the past 2.5-3 weeks. Patient states that she has not eaten anything green that would cause that and has not taken any medications that would cause that. Patient reports taking Tylenol , no abx. Patient did reports that she has not had a gallbladder since she was about 78 years old. Advised that it could be that foods are not being digested well and rapidly passing through her intestine. Patient reports that she had a normal brown stool today, she will continue to monitor. If stool remain normal patient will not reach out, if stools return to bright green color patient will let us  know. Patient wanted me to make Dr. Suzann aware. Patient had no concerns at the end of the call.

## 2024-04-18 ENCOUNTER — Encounter (HOSPITAL_COMMUNITY): Payer: Self-pay | Admitting: Cardiology

## 2024-04-19 ENCOUNTER — Telehealth: Payer: Self-pay

## 2024-04-19 ENCOUNTER — Encounter: Payer: Self-pay | Admitting: Internal Medicine

## 2024-04-19 ENCOUNTER — Encounter: Admitting: Internal Medicine

## 2024-04-19 DIAGNOSIS — Z1231 Encounter for screening mammogram for malignant neoplasm of breast: Secondary | ICD-10-CM

## 2024-04-19 DIAGNOSIS — N644 Mastodynia: Secondary | ICD-10-CM

## 2024-04-19 NOTE — Telephone Encounter (Signed)
 Copied from CRM #[id number]. Topic: Clinical - Request for Lab/Test Order >> Apr 19, 2024  2:43 PM Alexandria E wrote: Reason for CRM: Patient gets her mammograms completed at the [facility] and she is needing an order for a diagnostic mammogram of the left breast sent to this facility.

## 2024-04-22 ENCOUNTER — Other Ambulatory Visit: Payer: Self-pay | Admitting: Internal Medicine

## 2024-04-22 DIAGNOSIS — N644 Mastodynia: Secondary | ICD-10-CM

## 2024-04-22 NOTE — Telephone Encounter (Signed)
 Ok this order has been placed per pt request, hopefully she will hear soon.  thanks

## 2024-04-22 NOTE — Addendum Note (Signed)
 Addended by: NORLEEN LYNWOOD ORN on: 04/22/2024 01:07 PM   Modules accepted: Orders

## 2024-05-07 ENCOUNTER — Ambulatory Visit: Payer: Medicare Other

## 2024-05-07 DIAGNOSIS — I495 Sick sinus syndrome: Secondary | ICD-10-CM | POA: Diagnosis not present

## 2024-05-08 LAB — CUP PACEART REMOTE DEVICE CHECK
Battery Voltage: 45
Date Time Interrogation Session: 20251125093616
Implantable Lead Connection Status: 753985
Implantable Lead Connection Status: 753985
Implantable Lead Implant Date: 20170921
Implantable Lead Implant Date: 20170921
Implantable Lead Location: 753859
Implantable Lead Location: 753860
Implantable Lead Model: 377
Implantable Lead Model: 377
Implantable Lead Serial Number: 49575519
Implantable Lead Serial Number: 49580836
Implantable Pulse Generator Implant Date: 20170921
Pulse Gen Model: 407145
Pulse Gen Serial Number: 68850891

## 2024-05-10 NOTE — Progress Notes (Signed)
 Remote PPM Transmission

## 2024-05-15 ENCOUNTER — Ambulatory Visit: Payer: Self-pay | Admitting: Internal Medicine

## 2024-05-16 ENCOUNTER — Encounter: Payer: Self-pay | Admitting: Internal Medicine

## 2024-05-16 ENCOUNTER — Telehealth: Payer: Self-pay | Admitting: Internal Medicine

## 2024-05-16 NOTE — Telephone Encounter (Signed)
 Pt requesting a call back discussing Afib. Please advice.

## 2024-05-16 NOTE — Telephone Encounter (Signed)
 Called patient left message on personal voice mail to call back.

## 2024-05-23 ENCOUNTER — Ambulatory Visit
Admission: RE | Admit: 2024-05-23 | Discharge: 2024-05-23 | Disposition: A | Discharge status: Home / Self Care | Source: Ambulatory Visit | Attending: Internal Medicine | Admitting: Internal Medicine

## 2024-05-23 ENCOUNTER — Other Ambulatory Visit: Payer: Self-pay | Admitting: Internal Medicine

## 2024-05-23 DIAGNOSIS — N644 Mastodynia: Secondary | ICD-10-CM

## 2024-05-23 DIAGNOSIS — N63 Unspecified lump in unspecified breast: Secondary | ICD-10-CM

## 2024-05-30 ENCOUNTER — Encounter: Payer: Self-pay | Admitting: Internal Medicine

## 2024-05-30 DIAGNOSIS — N63 Unspecified lump in unspecified breast: Secondary | ICD-10-CM

## 2024-05-30 DIAGNOSIS — N644 Mastodynia: Secondary | ICD-10-CM

## 2024-06-07 ENCOUNTER — Ambulatory Visit
Admission: RE | Admit: 2024-06-07 | Discharge: 2024-06-07 | Disposition: A | Discharge status: Home / Self Care | Source: Ambulatory Visit | Attending: Internal Medicine | Admitting: Internal Medicine

## 2024-06-07 DIAGNOSIS — N63 Unspecified lump in unspecified breast: Secondary | ICD-10-CM

## 2024-06-07 DIAGNOSIS — N644 Mastodynia: Secondary | ICD-10-CM

## 2024-06-07 HISTORY — PX: BREAST BIOPSY: SHX20

## 2024-06-10 LAB — SURGICAL PATHOLOGY

## 2024-06-11 ENCOUNTER — Encounter (HOSPITAL_COMMUNITY): Payer: Self-pay | Admitting: Cardiology

## 2024-06-11 DIAGNOSIS — M7989 Other specified soft tissue disorders: Secondary | ICD-10-CM

## 2024-06-12 ENCOUNTER — Ambulatory Visit (HOSPITAL_COMMUNITY): Payer: Self-pay | Admitting: Cardiology

## 2024-06-12 ENCOUNTER — Ambulatory Visit (HOSPITAL_COMMUNITY)
Admission: RE | Admit: 2024-06-12 | Discharge: 2024-06-12 | Disposition: A | Discharge status: Home / Self Care | Source: Ambulatory Visit | Attending: Cardiology | Admitting: Cardiology

## 2024-06-12 DIAGNOSIS — M7989 Other specified soft tissue disorders: Secondary | ICD-10-CM | POA: Diagnosis not present

## 2024-06-15 ENCOUNTER — Encounter (HOSPITAL_COMMUNITY): Payer: Self-pay | Admitting: Cardiology

## 2024-06-21 ENCOUNTER — Telehealth: Payer: Self-pay | Admitting: Internal Medicine

## 2024-06-21 DIAGNOSIS — R310 Gross hematuria: Secondary | ICD-10-CM

## 2024-06-21 NOTE — Telephone Encounter (Signed)
 Ok this referral has been placed    Thanks

## 2024-06-21 NOTE — Telephone Encounter (Signed)
 Patient had a procedure yesterday at [facility]. Her insurance is requiring that a new referral be sent to [facility] before they will cover the visit. Please place new referral to [facility] as soon as possible, and call patient to let her know once referral has been placed. [phone]

## 2024-06-24 NOTE — Telephone Encounter (Signed)
 Called and let Pt know

## 2024-07-15 ENCOUNTER — Encounter: Admitting: Internal Medicine

## 2024-07-17 ENCOUNTER — Encounter: Payer: Self-pay | Admitting: Internal Medicine

## 2024-07-17 ENCOUNTER — Ambulatory Visit: Admitting: Internal Medicine

## 2024-07-17 VITALS — BP 122/72 | HR 90 | Temp 98.3°F | Resp 16 | Ht 62.5 in | Wt 168.0 lb

## 2024-07-17 DIAGNOSIS — G894 Chronic pain syndrome: Secondary | ICD-10-CM

## 2024-07-17 DIAGNOSIS — M48061 Spinal stenosis, lumbar region without neurogenic claudication: Secondary | ICD-10-CM

## 2024-07-17 DIAGNOSIS — I509 Heart failure, unspecified: Secondary | ICD-10-CM

## 2024-07-17 DIAGNOSIS — R9431 Abnormal electrocardiogram [ECG] [EKG]: Secondary | ICD-10-CM

## 2024-07-17 DIAGNOSIS — K589 Irritable bowel syndrome without diarrhea: Secondary | ICD-10-CM

## 2024-07-17 DIAGNOSIS — M159 Polyosteoarthritis, unspecified: Secondary | ICD-10-CM | POA: Insufficient documentation

## 2024-07-17 DIAGNOSIS — Z8669 Personal history of other diseases of the nervous system and sense organs: Secondary | ICD-10-CM

## 2024-07-17 DIAGNOSIS — I7300 Raynaud's syndrome without gangrene: Secondary | ICD-10-CM

## 2024-07-17 DIAGNOSIS — M797 Fibromyalgia: Secondary | ICD-10-CM

## 2024-07-17 NOTE — Patient Instructions (Signed)
        Supplements for Osteoarthritis  Natural anti-inflammatories can help reduce inflammation and joint stiffness without some of the harmful side effects of non-steroidal anti-inflammatories (Advil, Motrin, Aleve , etc)  Recommend starting with one supplement and give a 3-4 week trial period before adding another  You may be able to find some of these products at your local pharmacy, but may also purchase at [employer], other specialty stores, or online   Turmeric Recommended dose 400mg  once a day May increase to twice a day if tolerated (may cause stomach upset) Do not take if you are on a blood thinner, and stop prior to surgery   Ginger (root or capsules) Recommended dose is 2 grams twice daily, or 2 cups of tea daily Do not take if you are on a blood thinner, and stop prior to surgery   Fish Oil or Omega 3 Recommended dose for capsule is 2 grams twice daily (make sure it contains at least 30% of EPA/DHA) For food, two 3-ounce servings of fish a week, or flaxseed, chia seeds, walnuts, and almonds   Tart Cherry (dried, extract, or tablets) Recommended dose is 500mg  once a day   *Although these are natural products, they can still interact with medications. Always consult with your doctor or pharmacist when starting new supplements and/or medications*  *Patents should be under the care of a physician or other medical provide while taking these supplements*

## 2024-07-17 NOTE — Assessment & Plan Note (Signed)
 Renee [name]

## 2024-07-17 NOTE — Assessment & Plan Note (Signed)
 [name]
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
## 2024-07-17 NOTE — Assessment & Plan Note (Signed)
" °  Orders:   Sedimentation rate   C-reactive protein   C3 and C4   Cyclic citrul peptide antibody, IgG   Comprehensive metabolic panel with GFR  "

## 2024-07-18 ENCOUNTER — Ambulatory Visit (HOSPITAL_COMMUNITY)
Admission: RE | Admit: 2024-07-18 | Discharge: 2024-07-18 | Disposition: A | Source: Ambulatory Visit | Attending: Cardiology | Admitting: Cardiology

## 2024-07-18 VITALS — BP 122/76 | HR 77 | Wt 170.4 lb

## 2024-07-18 DIAGNOSIS — I495 Sick sinus syndrome: Secondary | ICD-10-CM

## 2024-07-18 DIAGNOSIS — I5022 Chronic systolic (congestive) heart failure: Secondary | ICD-10-CM

## 2024-07-18 LAB — C3 AND C4
C3 Complement: 168 mg/dL (ref 83–193)
C4 Complement: 26 mg/dL (ref 15–57)

## 2024-07-18 LAB — COMPREHENSIVE METABOLIC PANEL WITH GFR
AG Ratio: 1.7 (calc) (ref 1.0–2.5)
ALT: 16 U/L (ref 6–29)
AST: 17 U/L (ref 10–35)
Albumin: 4.5 g/dL (ref 3.6–5.1)
Alkaline phosphatase (APISO): 145 U/L (ref 37–153)
BUN: 10 mg/dL (ref 7–25)
CO2: 27 mmol/L (ref 20–32)
Calcium: 9.9 mg/dL (ref 8.6–10.4)
Chloride: 102 mmol/L (ref 98–110)
Creat: 0.63 mg/dL (ref 0.60–1.00)
Globulin: 2.6 g/dL (ref 1.9–3.7)
Glucose, Bld: 88 mg/dL (ref 65–99)
Potassium: 4.4 mmol/L (ref 3.5–5.3)
Sodium: 138 mmol/L (ref 135–146)
Total Bilirubin: 0.6 mg/dL (ref 0.2–1.2)
Total Protein: 7.1 g/dL (ref 6.1–8.1)
eGFR: 91 mL/min/{1.73_m2}

## 2024-07-18 LAB — SEDIMENTATION RATE: Sed Rate: 6 mm/h (ref 0–30)

## 2024-07-18 LAB — CYCLIC CITRUL PEPTIDE ANTIBODY, IGG: Cyclic Citrullin Peptide Ab: <16 U

## 2024-07-18 LAB — C-REACTIVE PROTEIN: CRP: 6.4 mg/L

## 2024-07-18 MED ORDER — DILTIAZEM HCL ER COATED BEADS 360 MG PO CP24: 360.0000 mg | ORAL_CAPSULE | Freq: Every morning | ORAL | 2 refills | Status: AC

## 2024-07-18 MED ORDER — TRAZODONE HCL 50 MG PO TABS: 50.0000 mg | ORAL_TABLET | Freq: Every evening | ORAL | 3 refills | Status: AC | PRN
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
## 2024-07-18 NOTE — Patient Instructions (Signed)
 TAKE Trazodone  50 mg as needed at bedtime.  Your physician recommends that you schedule a follow-up appointment in: 6 months ( August) ** PLEASE CALL THE OFFICE IN JUNE TO ARRANGE YOUR FOLLOW UP APPOINTMENT.**  If you have any questions or concerns before your next appointment please send us  a message through [place] or call our office at [phone].    TO LEAVE A MESSAGE FOR THE NURSE SELECT OPTION 2, PLEASE LEAVE A MESSAGE INCLUDING: YOUR NAME DATE OF BIRTH CALL BACK NUMBER REASON FOR CALL**this is important as we prioritize the call backs  YOU WILL RECEIVE A CALL BACK THE SAME DAY AS LONG AS YOU CALL BEFORE 4:00 PM  At the [facility], you and your health needs are our priority. As part of our continuing mission to provide you with exceptional heart care, we have created designated Provider Care Teams. These Care Teams include your primary Cardiologist (physician) and Advanced Practice Providers (APPs- Physician Assistants and Nurse Practitioners) who all work together to provide you with the care you need, when you need it.   You may see any of the following providers on your designated Care Team at your next follow up: Dr Toribio Fuel Dr Ezra Shuck Dr. Morene Brownie Greig Mosses, NP Caffie Shed, [place] [facility] [place], [place] Beckey Coe, NP Jordan Lee, NP Ellouise Class, NP Tinnie Redman, PharmD Jaun Bash, PharmD   Please be sure to bring in all your medications bottles to every appointment.    Thank you for choosing [facility]

## 2024-08-06 ENCOUNTER — Ambulatory Visit: Payer: Medicare Other

## 2024-09-04 ENCOUNTER — Ambulatory Visit: Admitting: Internal Medicine

## 2024-11-05 ENCOUNTER — Ambulatory Visit: Payer: Medicare Other

## 2024-11-28 ENCOUNTER — Ambulatory Visit

## 2025-02-04 ENCOUNTER — Ambulatory Visit: Payer: Medicare Other

## 2025-05-06 ENCOUNTER — Ambulatory Visit: Payer: Medicare Other

## 2025-08-05 ENCOUNTER — Ambulatory Visit: Payer: Medicare Other
